# Patient Record
Sex: Male | Born: 1935 | Race: White | Hispanic: No | Marital: Married | Smoking: Never smoker
Health system: Southern US, Community
[De-identification: ages and names within clinical notes are randomized; demographics above are authoritative.]

## PROBLEM LIST (undated history)

## (undated) DIAGNOSIS — E139 Other specified diabetes mellitus without complications: Secondary | ICD-10-CM

## (undated) DIAGNOSIS — K8050 Calculus of bile duct without cholangitis or cholecystitis without obstruction: Secondary | ICD-10-CM

## (undated) DIAGNOSIS — E785 Hyperlipidemia, unspecified: Secondary | ICD-10-CM

## (undated) DIAGNOSIS — T7840XA Allergy, unspecified, initial encounter: Secondary | ICD-10-CM

## (undated) DIAGNOSIS — I10 Essential (primary) hypertension: Secondary | ICD-10-CM

## (undated) DIAGNOSIS — I714 Abdominal aortic aneurysm, without rupture, unspecified: Secondary | ICD-10-CM

## (undated) DIAGNOSIS — K649 Unspecified hemorrhoids: Secondary | ICD-10-CM

## (undated) DIAGNOSIS — M810 Age-related osteoporosis without current pathological fracture: Secondary | ICD-10-CM

## (undated) DIAGNOSIS — K635 Polyp of colon: Secondary | ICD-10-CM

## (undated) DIAGNOSIS — I722 Aneurysm of renal artery: Secondary | ICD-10-CM

## (undated) DIAGNOSIS — G629 Polyneuropathy, unspecified: Secondary | ICD-10-CM

## (undated) DIAGNOSIS — I2510 Atherosclerotic heart disease of native coronary artery without angina pectoris: Secondary | ICD-10-CM

## (undated) DIAGNOSIS — C801 Malignant (primary) neoplasm, unspecified: Secondary | ICD-10-CM

## (undated) DIAGNOSIS — K219 Gastro-esophageal reflux disease without esophagitis: Secondary | ICD-10-CM

## (undated) DIAGNOSIS — C259 Malignant neoplasm of pancreas, unspecified: Secondary | ICD-10-CM

## (undated) DIAGNOSIS — C4491 Basal cell carcinoma of skin, unspecified: Secondary | ICD-10-CM

## (undated) DIAGNOSIS — B019 Varicella without complication: Secondary | ICD-10-CM

## (undated) DIAGNOSIS — K760 Fatty (change of) liver, not elsewhere classified: Secondary | ICD-10-CM

## (undated) DIAGNOSIS — N400 Enlarged prostate without lower urinary tract symptoms: Secondary | ICD-10-CM

## (undated) DIAGNOSIS — R252 Cramp and spasm: Secondary | ICD-10-CM

## (undated) DIAGNOSIS — I700 Atherosclerosis of aorta: Secondary | ICD-10-CM

## (undated) HISTORY — DX: Malignant neoplasm of pancreas, unspecified: C25.9

## (undated) HISTORY — DX: Cramp and spasm: R25.2

## (undated) HISTORY — PX: OTHER SURGICAL HISTORY: SHX169

## (undated) HISTORY — PX: TONSILECTOMY, ADENOIDECTOMY, BILATERAL MYRINGOTOMY AND TUBES: SHX2538

## (undated) HISTORY — PX: ESOPHAGOGASTRODUODENOSCOPY (EGD) WITH PROPOFOL: SHX5813

## (undated) HISTORY — DX: Hyperlipidemia, unspecified: E78.5

## (undated) HISTORY — DX: Polyp of colon: K63.5

## (undated) HISTORY — DX: Malignant (primary) neoplasm, unspecified: C80.1

## (undated) HISTORY — PX: EYE SURGERY: SHX253

## (undated) HISTORY — PX: EYE MUSCLE SURGERY: SHX370

## (undated) HISTORY — DX: Varicella without complication: B01.9

## (undated) HISTORY — DX: Allergy, unspecified, initial encounter: T78.40XA

## (undated) HISTORY — PX: CATARACT EXTRACTION: SUR2

## (undated) HISTORY — DX: Basal cell carcinoma of skin, unspecified: C44.91

## (undated) HISTORY — PX: TONSILLECTOMY AND ADENOIDECTOMY: SUR1326

## (undated) HISTORY — DX: Polyneuropathy, unspecified: G62.9

---

## 2004-06-20 DIAGNOSIS — C61 Malignant neoplasm of prostate: Secondary | ICD-10-CM

## 2004-06-20 DIAGNOSIS — C801 Malignant (primary) neoplasm, unspecified: Secondary | ICD-10-CM

## 2004-06-20 HISTORY — DX: Malignant (primary) neoplasm, unspecified: C80.1

## 2004-06-20 HISTORY — DX: Malignant neoplasm of prostate: C61

## 2007-12-16 LAB — HM COLONOSCOPY: HM Colonoscopy: NORMAL

## 2008-02-28 LAB — HM COLONOSCOPY: HM Colonoscopy: NORMAL

## 2011-07-06 DIAGNOSIS — H353290 Exudative age-related macular degeneration, unspecified eye, stage unspecified: Secondary | ICD-10-CM | POA: Diagnosis not present

## 2011-08-10 DIAGNOSIS — H353290 Exudative age-related macular degeneration, unspecified eye, stage unspecified: Secondary | ICD-10-CM | POA: Diagnosis not present

## 2011-09-05 DIAGNOSIS — H01009 Unspecified blepharitis unspecified eye, unspecified eyelid: Secondary | ICD-10-CM | POA: Diagnosis not present

## 2011-09-21 DIAGNOSIS — H353290 Exudative age-related macular degeneration, unspecified eye, stage unspecified: Secondary | ICD-10-CM | POA: Diagnosis not present

## 2011-09-26 DIAGNOSIS — C4491 Basal cell carcinoma of skin, unspecified: Secondary | ICD-10-CM | POA: Diagnosis not present

## 2011-09-26 DIAGNOSIS — D485 Neoplasm of uncertain behavior of skin: Secondary | ICD-10-CM | POA: Diagnosis not present

## 2011-10-03 DIAGNOSIS — H01009 Unspecified blepharitis unspecified eye, unspecified eyelid: Secondary | ICD-10-CM | POA: Diagnosis not present

## 2011-11-03 DIAGNOSIS — C4441 Basal cell carcinoma of skin of scalp and neck: Secondary | ICD-10-CM | POA: Diagnosis not present

## 2011-11-03 DIAGNOSIS — C4491 Basal cell carcinoma of skin, unspecified: Secondary | ICD-10-CM | POA: Diagnosis not present

## 2011-11-04 DIAGNOSIS — H353290 Exudative age-related macular degeneration, unspecified eye, stage unspecified: Secondary | ICD-10-CM | POA: Diagnosis not present

## 2011-11-23 DIAGNOSIS — C44319 Basal cell carcinoma of skin of other parts of face: Secondary | ICD-10-CM | POA: Diagnosis not present

## 2011-11-23 DIAGNOSIS — C4491 Basal cell carcinoma of skin, unspecified: Secondary | ICD-10-CM | POA: Diagnosis not present

## 2011-12-31 DIAGNOSIS — H353290 Exudative age-related macular degeneration, unspecified eye, stage unspecified: Secondary | ICD-10-CM | POA: Diagnosis not present

## 2012-02-09 DIAGNOSIS — Z125 Encounter for screening for malignant neoplasm of prostate: Secondary | ICD-10-CM | POA: Diagnosis not present

## 2012-02-09 DIAGNOSIS — E785 Hyperlipidemia, unspecified: Secondary | ICD-10-CM | POA: Diagnosis not present

## 2012-02-16 DIAGNOSIS — E785 Hyperlipidemia, unspecified: Secondary | ICD-10-CM | POA: Diagnosis not present

## 2012-02-16 DIAGNOSIS — Z23 Encounter for immunization: Secondary | ICD-10-CM | POA: Diagnosis not present

## 2012-02-16 DIAGNOSIS — C61 Malignant neoplasm of prostate: Secondary | ICD-10-CM | POA: Diagnosis not present

## 2012-02-22 DIAGNOSIS — H353290 Exudative age-related macular degeneration, unspecified eye, stage unspecified: Secondary | ICD-10-CM | POA: Diagnosis not present

## 2012-03-05 DIAGNOSIS — Z85828 Personal history of other malignant neoplasm of skin: Secondary | ICD-10-CM | POA: Diagnosis not present

## 2012-03-05 DIAGNOSIS — L570 Actinic keratosis: Secondary | ICD-10-CM | POA: Diagnosis not present

## 2012-04-27 DIAGNOSIS — H353290 Exudative age-related macular degeneration, unspecified eye, stage unspecified: Secondary | ICD-10-CM | POA: Diagnosis not present

## 2012-06-22 DIAGNOSIS — H353290 Exudative age-related macular degeneration, unspecified eye, stage unspecified: Secondary | ICD-10-CM | POA: Diagnosis not present

## 2012-08-17 DIAGNOSIS — H35059 Retinal neovascularization, unspecified, unspecified eye: Secondary | ICD-10-CM | POA: Diagnosis not present

## 2012-08-17 DIAGNOSIS — H31019 Macula scars of posterior pole (postinflammatory) (post-traumatic), unspecified eye: Secondary | ICD-10-CM | POA: Diagnosis not present

## 2012-08-17 DIAGNOSIS — H35379 Puckering of macula, unspecified eye: Secondary | ICD-10-CM | POA: Diagnosis not present

## 2012-08-17 DIAGNOSIS — H353290 Exudative age-related macular degeneration, unspecified eye, stage unspecified: Secondary | ICD-10-CM | POA: Diagnosis not present

## 2012-08-29 DIAGNOSIS — H35059 Retinal neovascularization, unspecified, unspecified eye: Secondary | ICD-10-CM | POA: Diagnosis not present

## 2012-08-29 DIAGNOSIS — H353290 Exudative age-related macular degeneration, unspecified eye, stage unspecified: Secondary | ICD-10-CM | POA: Diagnosis not present

## 2012-09-11 ENCOUNTER — Ambulatory Visit: Payer: Self-pay | Admitting: Internal Medicine

## 2012-09-26 DIAGNOSIS — H353290 Exudative age-related macular degeneration, unspecified eye, stage unspecified: Secondary | ICD-10-CM | POA: Diagnosis not present

## 2012-09-26 DIAGNOSIS — H31019 Macula scars of posterior pole (postinflammatory) (post-traumatic), unspecified eye: Secondary | ICD-10-CM | POA: Diagnosis not present

## 2012-09-26 DIAGNOSIS — H35379 Puckering of macula, unspecified eye: Secondary | ICD-10-CM | POA: Diagnosis not present

## 2012-09-26 DIAGNOSIS — H35059 Retinal neovascularization, unspecified, unspecified eye: Secondary | ICD-10-CM | POA: Diagnosis not present

## 2012-10-31 ENCOUNTER — Encounter: Payer: Self-pay | Admitting: Internal Medicine

## 2012-11-21 DIAGNOSIS — H353290 Exudative age-related macular degeneration, unspecified eye, stage unspecified: Secondary | ICD-10-CM | POA: Diagnosis not present

## 2012-11-27 DIAGNOSIS — L570 Actinic keratosis: Secondary | ICD-10-CM | POA: Diagnosis not present

## 2012-11-27 DIAGNOSIS — L821 Other seborrheic keratosis: Secondary | ICD-10-CM | POA: Diagnosis not present

## 2012-11-27 DIAGNOSIS — Z85828 Personal history of other malignant neoplasm of skin: Secondary | ICD-10-CM | POA: Diagnosis not present

## 2012-12-10 ENCOUNTER — Ambulatory Visit: Payer: Self-pay | Admitting: Internal Medicine

## 2012-12-13 ENCOUNTER — Encounter: Payer: Self-pay | Admitting: Internal Medicine

## 2012-12-13 ENCOUNTER — Other Ambulatory Visit: Payer: Self-pay | Admitting: Internal Medicine

## 2012-12-13 ENCOUNTER — Ambulatory Visit (INDEPENDENT_AMBULATORY_CARE_PROVIDER_SITE_OTHER): Payer: Medicare Other | Admitting: Internal Medicine

## 2012-12-13 VITALS — BP 118/68 | HR 66 | Temp 97.6°F | Resp 16 | Ht 77.0 in | Wt 191.5 lb

## 2012-12-13 DIAGNOSIS — R252 Cramp and spasm: Secondary | ICD-10-CM

## 2012-12-13 DIAGNOSIS — R634 Abnormal weight loss: Secondary | ICD-10-CM | POA: Diagnosis not present

## 2012-12-13 DIAGNOSIS — E538 Deficiency of other specified B group vitamins: Secondary | ICD-10-CM | POA: Diagnosis not present

## 2012-12-13 NOTE — Patient Instructions (Addendum)
We are checking your electrolytes and thyroid function today  I will review records from [facility] once available.   Return in August or September for your annual physical   Try taking Gas X  prior to your evening meal to see if it helps the discomfort you are having  If the feeling is resolved by walking after your biggest meal,  Try to continue this healthy practice

## 2012-12-13 NOTE — Progress Notes (Signed)
Patient ID: George Owens, male   DOB: [birth year], 79 y.o.   MRN: [id number]  Patient Active Problem List   Diagnosis Date Noted  . Hyperlipidemia   . Allergy   . Loss of weight 12/13/2012  . Muscle cramps 12/13/2012    Subjective:  CC:   Chief Complaint  Patient presents with  . Establish Care    HPI:   [name] is a 79 y.o. male who presents as a new patient to establish primary care with the chief complaint of   New patient  Leg cramps occurring in both legs.  Sporadic, also occurring in both feet and left hand. Aggravated by supine position that places pressure on left buttock in the middle of the night.  Does not occur with exercise, including  swimming or walking.  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  No daily pain . Has tried aleve at bedtime with no significant change.     Soles of feet sometimes feel cold and wet.  Occurs infrequently while at  rest for the past month.  Last labs were August 2013.   No history of ulcer or abdominal surgery but has been having LUQ pressure, not pain , aggravated by sitting or reclining after a meal. Unintentional wt loss of 15 lbs over the past 2 years since moving to [facility] despite voracious appetite.    Tends to become constipated .  Usually resolved with prune juice. History of colonic polyps,  None on last colonoscopy over 5 years ago at [place].   History of prostate CA remotely., treated with XRT while living in [place].   Annual PSAs and DRE have been normal   Vision loss:  Macular degeneration managed by [facility] in [place]   Past Medical History  Diagnosis Date  . Chicken pox   . Colon polyps   . Neuropathy   . Leg cramps   . Hyperlipidemia   . history of prostate CA   . Allergy     Past Surgical History  Procedure Laterality Date  . Tonsilectomy,  adenoidectomy, bilateral myringotomy and tubes    . Eye surgery      Family History  Problem Relation Age of Onset  . Stroke Mother   . Arthritis Father   . Heart disease Father     History   Social History  . Marital Status: Married    Spouse Name: N/A    Number of Children: N/A  . Years of Education: N/A   Occupational History  . Not on file.   Social History Main Topics  . Smoking status: Never Smoker   . Smokeless tobacco: Never Used  . Alcohol Use: No  . Drug Use: No  . Sexually Active: Yes -- Male partner(s)   Other Topics Concern  . Not on file   Social History Narrative  . No narrative on file       @ALLHX @    Review of Systems:   The remainder of the review of systems was negative except those addressed in the HPI.       Objective:  BP 118/68  Pulse 66  Temp(Src) 97.6 F (36.4 C) (Oral)  Resp 16  Ht 6\' 5"  (1.956 m)  Wt 191 lb 8 oz (86.864 kg)  BMI 22.7 kg/m2  SpO2 99%  General appearance: alert, cooperative and appears stated age  Ears: normal TM's and external ear canals both ears Throat: lips, mucosa, and tongue normal; teeth and gums normal Neck: no adenopathy, no carotid bruit, supple, symmetrical, trachea midline and thyroid not enlarged, symmetric, no tenderness/mass/nodules Back: symmetric, no curvature. ROM normal. No CVA tenderness. Lungs: clear to auscultation bilaterally Heart: regular rate and rhythm, S1, S2 normal, no murmur, click, rub or gallop Abdomen: soft, non-tender; bowel sounds normal; no masses,  no organomegaly Pulses: 2+ and symmetric Skin: Skin color, texture, turgor normal. No rashes or lesions Lymph nodes: Cervical, supraclavicular, and axillary nodes normal.  Assessment and Plan:  Muscle cramps Nocturnal,  With no evidence of hypocalcemia or other electrolyte abnormalities. thyroid function normal.  reassurance provided  Trial of tonic water.   Loss of weight Etiology unclear.  Need to consider  prostate CA recurrence and colon CA given history of polyps.  Thyroid function normal. Will check records from [place] to determine if he is due for repeat PSA and colonoscopy.  [name]    Updated Medication List Outpatient Encounter Prescriptions as of 12/13/2012  Medication Sig Dispense Refill  . Glucosamine-Chondroit-Vit C-Mn (GLUCOSAMINE CHONDR 1500 COMPLX) CAPS Take 2 capsules by mouth daily.      [name] loratadine (CLARITIN) 10 MG tablet Take 10 mg by mouth daily.      . Multiple Vitamins-Minerals (PRESERVISION AREDS 2) CAPS Take 2 capsules by mouth daily.      . Omega-3 Fatty Acids (FISH OIL MAXIMUM STRENGTH) 1200 MG CAPS Take 2 capsules by mouth daily.       No facility-administered encounter medications on file as of 12/13/2012.     Orders Placed This Encounter  Procedures  . Comprehensive metabolic panel  . Magnesium  . TSH  . Vitamin B12 Deficiency Panel - [facility]  . CBC with Differential    No Follow-up on file.

## 2012-12-14 LAB — CBC WITH DIFFERENTIAL/PLATELET
Basophils Absolute: 0 10*3/uL (ref 0.0–0.1)
Basophils Relative: 0.5 % (ref 0.0–3.0)
Eosinophils Absolute: 0.1 10*3/uL (ref 0.0–0.7)
Eosinophils Relative: 1.7 % (ref 0.0–5.0)
HCT: 41.5 % (ref 39.0–52.0)
Hemoglobin: 14 g/dL (ref 13.0–17.0)
Lymphocytes Relative: 31.4 % (ref 12.0–46.0)
Lymphs Abs: 1.5 10*3/uL (ref 0.7–4.0)
MCHC: 33.8 g/dL (ref 30.0–36.0)
MCV: 91.7 fl (ref 78.0–100.0)
Monocytes Absolute: 0.4 10*3/uL (ref 0.1–1.0)
Monocytes Relative: 8.3 % (ref 3.0–12.0)
Neutro Abs: 2.8 10*3/uL (ref 1.4–7.7)
Neutrophils Relative %: 58.1 % (ref 43.0–77.0)
Platelets: 154 10*3/uL (ref 150.0–400.0)
RBC: 4.52 Mil/uL (ref 4.22–5.81)
RDW: 13.4 % (ref 11.5–14.6)
WBC: 4.8 10*3/uL (ref 4.5–10.5)

## 2012-12-14 LAB — COMPREHENSIVE METABOLIC PANEL
ALT: 25 U/L (ref 0–53)
AST: 27 U/L (ref 0–37)
Albumin: 4.2 g/dL (ref 3.5–5.2)
Alkaline Phosphatase: 56 U/L (ref 39–117)
BUN: 21 mg/dL (ref 6–23)
CO2: 31 mEq/L (ref 19–32)
Calcium: 9.2 mg/dL (ref 8.4–10.5)
Chloride: 101 mEq/L (ref 96–112)
Creatinine, Ser: 0.9 mg/dL (ref 0.4–1.5)
GFR: 88.88 mL/min (ref >60.00)
Glucose, Bld: 98 mg/dL (ref 70–99)
Potassium: 4.5 mEq/L (ref 3.5–5.1)
Sodium: 138 mEq/L (ref 135–145)
Total Bilirubin: 0.5 mg/dL (ref 0.3–1.2)
Total Protein: 7 g/dL (ref 6.0–8.3)

## 2012-12-14 LAB — MAGNESIUM: Magnesium: 2.1 mg/dL (ref 1.5–2.5)

## 2012-12-14 LAB — TSH: TSH: 2.89 u[IU]/mL (ref 0.35–5.50)

## 2012-12-15 ENCOUNTER — Encounter: Payer: Self-pay | Admitting: Internal Medicine

## 2012-12-15 DIAGNOSIS — T7840XA Allergy, unspecified, initial encounter: Secondary | ICD-10-CM | POA: Insufficient documentation

## 2012-12-15 DIAGNOSIS — E785 Hyperlipidemia, unspecified: Secondary | ICD-10-CM | POA: Insufficient documentation

## 2012-12-15 LAB — VITAMIN B12: Vitamin B-12: 272 pg/mL (ref 211–911)

## 2012-12-15 NOTE — Assessment & Plan Note (Signed)
Nocturnal,  With no evidence of hypocalcemia or other electrolyte abnormalities. thyroid function normal.  reassurance provided  Trial of tonic water.

## 2012-12-15 NOTE — Assessment & Plan Note (Signed)
Etiology unclear.  Need to consider prostate CA recurrence and colon CA given history of polyps.  Thyroid function normal. Will check records from [place] to determine if he is due for repeat PSA and colonoscopy.  George Owens

## 2012-12-20 LAB — METHYLMALONIC ACID, SERUM: Methylmalonic Acid, Quant: 0.26 umol/L (ref <0.40)

## 2013-01-23 ENCOUNTER — Other Ambulatory Visit: Payer: Self-pay

## 2013-02-05 DIAGNOSIS — H35059 Retinal neovascularization, unspecified, unspecified eye: Secondary | ICD-10-CM | POA: Diagnosis not present

## 2013-02-05 DIAGNOSIS — H353290 Exudative age-related macular degeneration, unspecified eye, stage unspecified: Secondary | ICD-10-CM | POA: Diagnosis not present

## 2013-02-05 DIAGNOSIS — H35379 Puckering of macula, unspecified eye: Secondary | ICD-10-CM | POA: Diagnosis not present

## 2013-02-05 DIAGNOSIS — H31019 Macula scars of posterior pole (postinflammatory) (post-traumatic), unspecified eye: Secondary | ICD-10-CM | POA: Diagnosis not present

## 2013-02-27 ENCOUNTER — Ambulatory Visit (INDEPENDENT_AMBULATORY_CARE_PROVIDER_SITE_OTHER): Payer: Medicare Other | Admitting: Internal Medicine

## 2013-02-27 ENCOUNTER — Encounter: Payer: Self-pay | Admitting: Internal Medicine

## 2013-02-27 VITALS — BP 108/76 | HR 88 | Temp 98.1°F | Resp 14 | Ht 76.75 in | Wt 190.0 lb

## 2013-02-27 DIAGNOSIS — R1012 Left upper quadrant pain: Secondary | ICD-10-CM | POA: Diagnosis not present

## 2013-02-27 DIAGNOSIS — R252 Cramp and spasm: Secondary | ICD-10-CM

## 2013-02-27 DIAGNOSIS — Z125 Encounter for screening for malignant neoplasm of prostate: Secondary | ICD-10-CM | POA: Diagnosis not present

## 2013-02-27 DIAGNOSIS — E785 Hyperlipidemia, unspecified: Secondary | ICD-10-CM

## 2013-02-27 DIAGNOSIS — M791 Myalgia, unspecified site: Secondary | ICD-10-CM | POA: Insufficient documentation

## 2013-02-27 DIAGNOSIS — Z0000 Encounter for general adult medical examination without abnormal findings: Secondary | ICD-10-CM

## 2013-02-27 DIAGNOSIS — Z860100 Personal history of colon polyps, unspecified: Secondary | ICD-10-CM | POA: Insufficient documentation

## 2013-02-27 DIAGNOSIS — R634 Abnormal weight loss: Secondary | ICD-10-CM

## 2013-02-27 DIAGNOSIS — Z8546 Personal history of malignant neoplasm of prostate: Secondary | ICD-10-CM

## 2013-02-27 DIAGNOSIS — T466X5A Adverse effect of antihyperlipidemic and antiarteriosclerotic drugs, initial encounter: Secondary | ICD-10-CM | POA: Insufficient documentation

## 2013-02-27 DIAGNOSIS — Z789 Other specified health status: Secondary | ICD-10-CM

## 2013-02-27 DIAGNOSIS — Z888 Allergy status to other drugs, medicaments and biological substances status: Secondary | ICD-10-CM

## 2013-02-27 LAB — BASIC METABOLIC PANEL
BUN: 18 mg/dL (ref 6–23)
CO2: 29 mEq/L (ref 19–32)
Calcium: 9.1 mg/dL (ref 8.4–10.5)
Chloride: 101 mEq/L (ref 96–112)
Creatinine, Ser: 0.9 mg/dL (ref 0.4–1.5)
GFR: 92.46 mL/min (ref >60.00)
Glucose, Bld: 88 mg/dL (ref 70–99)
Potassium: 3.9 mEq/L (ref 3.5–5.1)
Sodium: 138 mEq/L (ref 135–145)

## 2013-02-27 LAB — PSA, MEDICARE: PSA: 0.68 ng/ml (ref 0.10–4.00)

## 2013-02-27 LAB — H. PYLORI ANTIBODY, IGG: H Pylori IgG: NEGATIVE

## 2013-02-27 MED ORDER — ZOSTER VACCINE LIVE 19400 UNT/0.65ML ~~LOC~~ SOLR: 0.6500 mL | Freq: Once | SUBCUTANEOUS | Status: DC

## 2013-02-27 MED ORDER — TETANUS-DIPHTH-ACELL PERTUSSIS 5-2.5-18.5 LF-MCG/0.5 IM SUSP: 0.5000 mL | Freq: Once | INTRAMUSCULAR | Status: DC

## 2013-02-27 NOTE — Assessment & Plan Note (Addendum)
Given his continued loss of weight , history of prostate cancer and normal thyroid function at last screen, I have recommended CT the abdomen and pelvis to evaluate the post prandial fullness that he cites his occurring regularly. This is normal we will refer for endoscopy to rule out gastric cancer.

## 2013-02-27 NOTE — Assessment & Plan Note (Addendum)
Untreated due to history of statin intolerance. Patient has no history of heart disease or hypertension and is physically active.

## 2013-02-27 NOTE — Assessment & Plan Note (Signed)
Annual male exam was done including testicular and prostate exam. PSA is pending .  Colon ca screening was reviewed and options given.   

## 2013-02-27 NOTE — Progress Notes (Signed)
Patient ID: George Owens, male   DOB: Oct 16, 1931, 79 y.o.   MRN: [id number]  Annual exam  The patient is here for annual Medicare wellness examination and management of other chronic and acute problems.  Leg cramps occurring in both legs.  Sporadic, also occurring in both feet and left hand. Aggravated by supine position that places pressure on left buttock in the middle of the night.  Does not occur with exercise, including  swimming or walking.  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  No daily pain . Has tried aleve at bedtime with no significant change.     Soles of feet sometimes feel cold and wet.  Occurs infrequently while at  rest for the past month.  Contiues to have mild LUQ pressure, not pain , aggravated by sitting or reclining after a meal. Unintentional wt loss of 16 lbs over the past 2 years since moving to [place] despite a voracious appetite.    Tends to become constipated .  Usually resolved with prune juice. History of colonic polyps,  None on last colonoscopy over 5 years ago at [place].   History of prostate CA remotely., treated with XRT while living in [place].   Annual PSAs and DRE have been normal   Vision loss:  Macular degeneration managed by [facility] in [place]     The risk factors are reflected in the social history.  The roster of all physicians providing medical care to patient - is listed in the Snapshot section of the chart.  Activities of daily living:  The patient is 100% independent in all ADLs: dressing, toileting, feeding as well as independent mobility  Home safety : The patient has smoke detectors in the home. They wear seatbelts.  There are no firearms at home. There is no violence in the home.   There is no risks for hepatitis, STDs or HIV. There is no   history of blood transfusion. They have  no travel history to infectious disease endemic areas of the world.  The patient has seen their dentist in the last six month. They have seen their eye doctor in the last year. They admit to slight hearing difficulty with regard to whispered voices and some television programs.  They have deferred audiologic testing in the last year.  They do not  have excessive sun exposure. Discussed the need for sun protection: hats, long sleeves and use of sunscreen if there is significant sun exposure.   Diet: the importance of a healthy diet is discussed. They do have a healthy diet.  The benefits of regular aerobic exercise were discussed. She walks 4 times per week ,  20 minutes.   Depression screen: there are no signs or vegative symptoms of depression- irritability, change in appetite, anhedonia, sadness/tearfullness.  Cognitive assessment: the patient manages all their financial and personal affairs and is actively engaged. They could relate day,date,year and events; recalled 2/3 objects at 3 minutes; performed clock-face test normally.  The following portions of the patient's history were reviewed and updated as appropriate: allergies, current medications, past family history, past medical history,  past surgical history, past social history  and problem list.  Visual acuity was not assessed per patient preference since she has regular follow up with her ophthalmologist. Hearing and body mass index were assessed and reviewed.   During the course of the  visit the patient was educated and counseled about appropriate screening and preventive services including : fall prevention , diabetes screening, nutrition counseling, colorectal cancer screening, and recommended immunizations.    Objective  General Appearance:    Alert, cooperative, no distress, appears stated age  Head:    Normocephalic, without obvious abnormality, atraumatic  Eyes:    PERRL, conjunctiva/corneas clear, EOM's intact, fundi     benign, both eyes  Ears:    Normal TM's and external ear canals, both ears  Nose:   Nares normal, septum midline, mucosa normal, no drainage    or sinus tenderness  Throat:   Lips, mucosa, and tongue normal; teeth and gums normal  Neck:   Supple, symmetrical, trachea midline, no adenopathy;    thyroid:  no enlargement/tenderness/nodules; no carotid   bruit or JVD  Back:     Symmetric, no curvature, ROM normal, no CVA tenderness  Lungs:     Clear to auscultation bilaterally, respirations unlabored  Chest Wall:    No tenderness or deformity   Heart:    Regular rate and rhythm, S1 and S2 normal, no murmur, rub   or gallop  Breast Exam:    No tenderness, masses, or nipple abnormality  Abdomen:     Soft, non-tender, bowel sounds active all four quadrants,    no masses, no organomegaly  Genitalia:    Pelvic: cervix normal in appearance, external genitalia normal, no adnexal masses or tenderness, no cervical motion tenderness, rectovaginal septum normal, uterus normal size, shape, and consistency and vagina normal without discharge  Extremities:   Extremities normal, atraumatic, no cyanosis or edema  Pulses:   2+ and symmetric all extremities  Skin:   Skin color, texture, turgor normal, no rashes or lesions  Lymph nodes:   Cervical, supraclavicular, and axillary nodes normal  Neurologic:   CNII-XII intact, normal strength, sensation and reflexes    throughout   Assessment and Plan  Loss of weight Given his continued loss of weight , history of prostate cancer and normal thyroid function at last screen, I have recommended CT the abdomen and pelvis to evaluate the post prandial fullness that he cites his occurring regularly. This is normal we will refer for endoscopy to rule out gastric cancer.  Hyperlipidemia Untreated due to history of statin intolerance. Patient has no history of heart disease or hypertension and is physically active.  Routine general medical examination at a health care  facility Annual male exam was done including testicular and prostate exam. PSA is pending .  Colon ca screening was reviewed and options given.     Updated Medication List Outpatient Encounter Prescriptions as of 02/27/2013  Medication Sig Dispense Refill  . Glucosamine-Chondroit-Vit C-Mn (GLUCOSAMINE CHONDR 1500 COMPLX) CAPS Take 2 capsules by mouth daily.      [name] loratadine (CLARITIN) 10 MG tablet Take 10 mg by mouth daily.      . Multiple Vitamins-Minerals (PRESERVISION AREDS 2) CAPS Take 2 capsules by mouth daily.      . Omega-3 Fatty Acids (FISH OIL MAXIMUM STRENGTH) 1200 MG CAPS Take 2 capsules by mouth daily.      . TDaP (BOOSTRIX) 5-2.5-18.5 LF-MCG/0.5 injection Inject 0.5 mLs into the muscle once.  0.5 mL  0  . zoster vaccine live, PF, (ZOSTAVAX) 16109 UNT/0.65ML injection Inject 19,400 Units into the skin once.  1 each  0   No facility-administered encounter medications on file as of 02/27/2013.

## 2013-02-27 NOTE — Patient Instructions (Addendum)
You had your annual Medicare wellness exam today  We will schedule your  CT of the abdomen and pelvis to evaluate the weight loss issue  If it is normal (noi recurrence of Cancer),  I will refer you to [facility] to have an endoscopy to rule out  Gastric ulcer    I recommend that you have a TDaP caccine and a Shingles vaccine.  I have given you prescriptions for thses because they will be cheaper at the health Dept or at your  local pharmacy because Medicare will not reimburse for them.   We will contact you with the bloodwork results

## 2013-02-28 ENCOUNTER — Encounter: Payer: Self-pay | Admitting: Internal Medicine

## 2013-02-28 ENCOUNTER — Ambulatory Visit: Payer: Self-pay | Admitting: Internal Medicine

## 2013-02-28 DIAGNOSIS — R1012 Left upper quadrant pain: Secondary | ICD-10-CM | POA: Diagnosis not present

## 2013-03-01 ENCOUNTER — Telehealth: Payer: Self-pay | Admitting: Internal Medicine

## 2013-03-01 ENCOUNTER — Encounter: Payer: Self-pay | Admitting: Internal Medicine

## 2013-03-01 DIAGNOSIS — Z8546 Personal history of malignant neoplasm of prostate: Secondary | ICD-10-CM | POA: Insufficient documentation

## 2013-03-01 NOTE — Telephone Encounter (Signed)
CT was normal.  No sign of Cancer..  If he would like to try taking omeprazole 40 mg daily for a few weeks to see if the abdominal symptoms resolve , we can do that before sending to GI

## 2013-03-07 MED ORDER — OMEPRAZOLE 40 MG PO CPDR: 40.0000 mg | DELAYED_RELEASE_CAPSULE | Freq: Every day | ORAL | Status: DC

## 2013-03-07 NOTE — Telephone Encounter (Signed)
Patient would like to try the omeprazole before seeing GI for symptoms.

## 2013-03-07 NOTE — Telephone Encounter (Signed)
rx sent.  If no improvement   in 4 weeks,.  Call for GI eval

## 2013-03-08 NOTE — Telephone Encounter (Signed)
Left message, notifying pt of Rx and to call back with failure of improvement.

## 2013-03-11 ENCOUNTER — Encounter: Payer: Self-pay | Admitting: Internal Medicine

## 2013-03-12 ENCOUNTER — Telehealth: Payer: Self-pay | Admitting: Internal Medicine

## 2013-03-12 NOTE — Telephone Encounter (Signed)
Pt is calling and wanting to speak with you about something. He would not go into detail with me and says you would know about what he was asking.

## 2013-03-12 NOTE — Telephone Encounter (Signed)
Tried to return patient call left voicemail to return call to office.

## 2013-04-25 ENCOUNTER — Other Ambulatory Visit: Payer: Self-pay

## 2013-04-30 DIAGNOSIS — H35059 Retinal neovascularization, unspecified, unspecified eye: Secondary | ICD-10-CM | POA: Diagnosis not present

## 2013-04-30 DIAGNOSIS — H353290 Exudative age-related macular degeneration, unspecified eye, stage unspecified: Secondary | ICD-10-CM | POA: Diagnosis not present

## 2013-06-24 DIAGNOSIS — R1310 Dysphagia, unspecified: Secondary | ICD-10-CM | POA: Diagnosis not present

## 2013-06-28 ENCOUNTER — Ambulatory Visit: Payer: Self-pay | Admitting: Unknown Physician Specialty

## 2013-06-28 DIAGNOSIS — K225 Diverticulum of esophagus, acquired: Secondary | ICD-10-CM | POA: Diagnosis not present

## 2013-06-28 DIAGNOSIS — K219 Gastro-esophageal reflux disease without esophagitis: Secondary | ICD-10-CM | POA: Diagnosis not present

## 2013-06-28 DIAGNOSIS — R1310 Dysphagia, unspecified: Secondary | ICD-10-CM | POA: Diagnosis not present

## 2013-07-09 DIAGNOSIS — R1310 Dysphagia, unspecified: Secondary | ICD-10-CM | POA: Diagnosis not present

## 2013-07-09 DIAGNOSIS — K225 Diverticulum of esophagus, acquired: Secondary | ICD-10-CM | POA: Diagnosis not present

## 2013-07-09 DIAGNOSIS — R933 Abnormal findings on diagnostic imaging of other parts of digestive tract: Secondary | ICD-10-CM | POA: Diagnosis not present

## 2013-07-10 ENCOUNTER — Other Ambulatory Visit: Payer: Self-pay | Admitting: *Deleted

## 2013-07-10 MED ORDER — OMEPRAZOLE 40 MG PO CPDR: 40.0000 mg | DELAYED_RELEASE_CAPSULE | Freq: Every day | ORAL | Status: DC

## 2013-07-16 ENCOUNTER — Ambulatory Visit: Payer: Self-pay | Admitting: Gastroenterology

## 2013-07-16 DIAGNOSIS — H3530 Unspecified macular degeneration: Secondary | ICD-10-CM | POA: Diagnosis not present

## 2013-07-16 DIAGNOSIS — R1310 Dysphagia, unspecified: Secondary | ICD-10-CM | POA: Diagnosis not present

## 2013-07-16 DIAGNOSIS — Z888 Allergy status to other drugs, medicaments and biological substances status: Secondary | ICD-10-CM | POA: Diagnosis not present

## 2013-07-16 DIAGNOSIS — Z8546 Personal history of malignant neoplasm of prostate: Secondary | ICD-10-CM | POA: Diagnosis not present

## 2013-07-16 DIAGNOSIS — E785 Hyperlipidemia, unspecified: Secondary | ICD-10-CM | POA: Diagnosis not present

## 2013-07-16 DIAGNOSIS — Z79899 Other long term (current) drug therapy: Secondary | ICD-10-CM | POA: Diagnosis not present

## 2013-07-16 DIAGNOSIS — R933 Abnormal findings on diagnostic imaging of other parts of digestive tract: Secondary | ICD-10-CM | POA: Diagnosis not present

## 2013-07-17 LAB — PATHOLOGY REPORT

## 2013-08-07 DIAGNOSIS — H353290 Exudative age-related macular degeneration, unspecified eye, stage unspecified: Secondary | ICD-10-CM | POA: Diagnosis not present

## 2013-08-07 DIAGNOSIS — H31019 Macula scars of posterior pole (postinflammatory) (post-traumatic), unspecified eye: Secondary | ICD-10-CM | POA: Diagnosis not present

## 2013-08-07 DIAGNOSIS — H35059 Retinal neovascularization, unspecified, unspecified eye: Secondary | ICD-10-CM | POA: Diagnosis not present

## 2013-08-09 ENCOUNTER — Other Ambulatory Visit: Payer: Self-pay | Admitting: Adult Health

## 2013-08-09 ENCOUNTER — Ambulatory Visit (INDEPENDENT_AMBULATORY_CARE_PROVIDER_SITE_OTHER): Payer: Medicare Other | Admitting: Adult Health

## 2013-08-09 ENCOUNTER — Encounter: Payer: Self-pay | Admitting: Adult Health

## 2013-08-09 ENCOUNTER — Ambulatory Visit (INDEPENDENT_AMBULATORY_CARE_PROVIDER_SITE_OTHER)
Admission: RE | Admit: 2013-08-09 | Discharge: 2013-08-09 | Disposition: A | Discharge status: Home / Self Care | Payer: Medicare Other | Source: Ambulatory Visit | Attending: Adult Health | Admitting: Adult Health

## 2013-08-09 VITALS — BP 110/60 | HR 78 | Temp 97.7°F | Resp 14 | Wt 194.0 lb

## 2013-08-09 DIAGNOSIS — M549 Dorsalgia, unspecified: Secondary | ICD-10-CM | POA: Diagnosis not present

## 2013-08-09 DIAGNOSIS — M25559 Pain in unspecified hip: Secondary | ICD-10-CM | POA: Diagnosis not present

## 2013-08-09 DIAGNOSIS — M169 Osteoarthritis of hip, unspecified: Secondary | ICD-10-CM | POA: Diagnosis not present

## 2013-08-09 DIAGNOSIS — R9389 Abnormal findings on diagnostic imaging of other specified body structures: Secondary | ICD-10-CM

## 2013-08-09 DIAGNOSIS — M1610 Unilateral primary osteoarthritis, unspecified hip: Secondary | ICD-10-CM | POA: Diagnosis not present

## 2013-08-09 DIAGNOSIS — M25552 Pain in left hip: Secondary | ICD-10-CM

## 2013-08-09 DIAGNOSIS — G8929 Other chronic pain: Secondary | ICD-10-CM | POA: Insufficient documentation

## 2013-08-09 LAB — POCT URINALYSIS DIPSTICK
Bilirubin, UA: NEGATIVE
Blood, UA: NEGATIVE
Glucose, UA: NEGATIVE
Ketones, UA: NEGATIVE
Leukocytes, UA: NEGATIVE
Nitrite, UA: NEGATIVE
Protein, UA: NEGATIVE
Spec Grav, UA: 1.025
Urobilinogen, UA: 0.2
pH, UA: 6

## 2013-08-09 NOTE — Progress Notes (Signed)
   Subjective:    Patient ID: George Owens, male    DOB: 02-22-1932, 80 y.o.   MRN: [id number]  HPI  [name] is a pleasant 80 y/o male with hx of prostate cancer who presents to clinic with 1 week hx of left hip and back pain. He has a hx of low back pain with sciatica; however, he reports this pain is not radiating and is concerning him because of his history. His pain is currently at 0/10 but gets to 7/10 especially with walking and certain movements. He cannot lift his left leg as well as the right one.  Current Outpatient Prescriptions on File Prior to Visit  Medication Sig Dispense Refill  . Glucosamine-Chondroit-Vit C-Mn (GLUCOSAMINE CHONDR 1500 COMPLX) CAPS Take 2 capsules by mouth daily.      [name] loratadine (CLARITIN) 10 MG tablet Take 10 mg by mouth daily.      . Multiple Vitamins-Minerals (PRESERVISION AREDS 2) CAPS Take 2 capsules by mouth daily.      . Omega-3 Fatty Acids (FISH OIL MAXIMUM STRENGTH) 1200 MG CAPS Take 2 capsules by mouth daily.      [name] omeprazole (PRILOSEC) 40 MG capsule Take 1 capsule (40 mg total) by mouth daily.  30 capsule  5  . TDaP (BOOSTRIX) 5-2.5-18.5 LF-MCG/0.5 injection Inject 0.5 mLs into the muscle once.  0.5 mL  0  . zoster vaccine live, PF, (ZOSTAVAX) 67893 UNT/0.65ML injection Inject 19,400 Units into the skin once.  1 each  0   No current facility-administered medications on file prior to visit.     Review of Systems  Respiratory: Negative.   Cardiovascular: Negative.   Genitourinary: Negative.  Negative for urgency, frequency, hematuria and flank pain.  Musculoskeletal: Positive for back pain. Arthralgias: left hip pain - worse 8/10 with certain movements.  Psychiatric/Behavioral: Negative.   All other systems reviewed and are negative.       Objective:   Physical Exam  Constitutional: He is oriented to person, place, and time. He appears well-developed and well-nourished. No distress.  Cardiovascular: Normal rate and regular rhythm.    Pulmonary/Chest: Effort normal. No respiratory distress.  Musculoskeletal: He exhibits tenderness.  Left hip tenderness. Decreased ROM of left leg.  Neurological: He is alert and oriented to person, place, and time.  Skin: Skin is warm and dry.  Psychiatric: He has a normal mood and affect. His behavior is normal. Judgment and thought content normal.   BP 110/60  Pulse 78  Temp(Src) 97.7 F (36.5 C) (Oral)  Resp 14  Wt 194 lb (87.998 kg)  SpO2 98%        Assessment & Plan:   1. Back pain Complains of low back pain mainly towards the left side. UA without any blood, nitrites or leukocytes.  - POCT urinalysis dipstick  2. Hip pain, left Pt is concerned about bone metastases give his hx of prostate cancer. PSA is WNL which was done in September. I will send him for xray of left hip to r/o bone mets. Other possible dx - arthritis, bursitis  - DG Hip Complete Left; Future

## 2013-08-09 NOTE — Progress Notes (Signed)
Pre visit review using our clinic review tool, if applicable. No additional management support is needed unless otherwise documented below in the visit note. 

## 2013-08-14 ENCOUNTER — Ambulatory Visit: Payer: Self-pay | Admitting: Adult Health

## 2013-08-14 ENCOUNTER — Telehealth: Payer: Self-pay | Admitting: Adult Health

## 2013-08-14 DIAGNOSIS — Z8546 Personal history of malignant neoplasm of prostate: Secondary | ICD-10-CM | POA: Diagnosis not present

## 2013-08-14 DIAGNOSIS — C61 Malignant neoplasm of prostate: Secondary | ICD-10-CM | POA: Diagnosis not present

## 2013-08-14 DIAGNOSIS — M949 Disorder of cartilage, unspecified: Secondary | ICD-10-CM | POA: Diagnosis not present

## 2013-08-14 DIAGNOSIS — R948 Abnormal results of function studies of other organs and systems: Secondary | ICD-10-CM | POA: Diagnosis not present

## 2013-08-14 DIAGNOSIS — M899 Disorder of bone, unspecified: Secondary | ICD-10-CM | POA: Diagnosis not present

## 2013-08-14 NOTE — Telephone Encounter (Signed)
Spoke with pt regarding results of bone scan showing metastatic disease. He is agreeable to referral to [facility] ASAP.

## 2013-08-19 ENCOUNTER — Ambulatory Visit: Payer: Self-pay | Admitting: Oncology

## 2013-08-19 DIAGNOSIS — Z8546 Personal history of malignant neoplasm of prostate: Secondary | ICD-10-CM | POA: Diagnosis not present

## 2013-08-19 DIAGNOSIS — R948 Abnormal results of function studies of other organs and systems: Secondary | ICD-10-CM | POA: Diagnosis not present

## 2013-08-19 DIAGNOSIS — K649 Unspecified hemorrhoids: Secondary | ICD-10-CM | POA: Diagnosis not present

## 2013-08-19 DIAGNOSIS — S32009A Unspecified fracture of unspecified lumbar vertebra, initial encounter for closed fracture: Secondary | ICD-10-CM | POA: Diagnosis not present

## 2013-08-19 DIAGNOSIS — R970 Elevated carcinoembryonic antigen [CEA]: Secondary | ICD-10-CM | POA: Diagnosis not present

## 2013-08-19 DIAGNOSIS — Z79899 Other long term (current) drug therapy: Secondary | ICD-10-CM | POA: Diagnosis not present

## 2013-08-20 DIAGNOSIS — R948 Abnormal results of function studies of other organs and systems: Secondary | ICD-10-CM | POA: Diagnosis not present

## 2013-08-20 DIAGNOSIS — Z8546 Personal history of malignant neoplasm of prostate: Secondary | ICD-10-CM | POA: Diagnosis not present

## 2013-08-20 LAB — COMPREHENSIVE METABOLIC PANEL
Albumin: 3.8 g/dL (ref 3.4–5.0)
Alkaline Phosphatase: 93 U/L
Anion Gap: 8 (ref 7–16)
BUN: 23 mg/dL — ABNORMAL HIGH (ref 7–18)
Bilirubin,Total: 0.4 mg/dL (ref 0.2–1.0)
Calcium, Total: 8.5 mg/dL (ref 8.5–10.1)
Chloride: 102 mmol/L (ref 98–107)
Co2: 31 mmol/L (ref 21–32)
Creatinine: 0.96 mg/dL (ref 0.60–1.30)
EGFR (African American): >60
EGFR (Non-African Amer.): >60
Glucose: 132 mg/dL — ABNORMAL HIGH (ref 65–99)
Osmolality: 287 (ref 275–301)
Potassium: 4.2 mmol/L (ref 3.5–5.1)
SGOT(AST): 27 U/L (ref 15–37)
SGPT (ALT): 39 U/L (ref 12–78)
Sodium: 141 mmol/L (ref 136–145)
Total Protein: 7.1 g/dL (ref 6.4–8.2)

## 2013-08-20 LAB — CBC CANCER CENTER
Basophil #: 0 x10 3/mm (ref 0.0–0.1)
Basophil %: 0.5 %
Eosinophil #: 0 x10 3/mm (ref 0.0–0.7)
Eosinophil %: 0.7 %
HCT: 43.2 % (ref 40.0–52.0)
HGB: 14.3 g/dL (ref 13.0–18.0)
Lymphocyte #: 1.1 x10 3/mm (ref 1.0–3.6)
Lymphocyte %: 21.8 %
MCH: 29.9 pg (ref 26.0–34.0)
MCHC: 33 g/dL (ref 32.0–36.0)
MCV: 91 fL (ref 80–100)
Monocyte #: 0.4 x10 3/mm (ref 0.2–1.0)
Monocyte %: 6.9 %
Neutrophil #: 3.7 x10 3/mm (ref 1.4–6.5)
Neutrophil %: 70.1 %
Platelet: 156 x10 3/mm (ref 150–440)
RBC: 4.78 10*6/uL (ref 4.40–5.90)
RDW: 13 % (ref 11.5–14.5)
WBC: 5.2 x10 3/mm (ref 3.8–10.6)

## 2013-08-21 LAB — CEA: CEA: 0.8 ng/mL (ref 0.0–4.7)

## 2013-08-21 LAB — PSA: PSA: 0.6 ng/mL (ref 0.0–4.0)

## 2013-08-22 DIAGNOSIS — Z8546 Personal history of malignant neoplasm of prostate: Secondary | ICD-10-CM | POA: Diagnosis not present

## 2013-08-22 DIAGNOSIS — R948 Abnormal results of function studies of other organs and systems: Secondary | ICD-10-CM | POA: Diagnosis not present

## 2013-08-30 DIAGNOSIS — C61 Malignant neoplasm of prostate: Secondary | ICD-10-CM | POA: Diagnosis not present

## 2013-08-30 DIAGNOSIS — M5137 Other intervertebral disc degeneration, lumbosacral region: Secondary | ICD-10-CM | POA: Diagnosis not present

## 2013-08-30 DIAGNOSIS — M48061 Spinal stenosis, lumbar region without neurogenic claudication: Secondary | ICD-10-CM | POA: Diagnosis not present

## 2013-09-02 DIAGNOSIS — Z8546 Personal history of malignant neoplasm of prostate: Secondary | ICD-10-CM | POA: Diagnosis not present

## 2013-09-02 DIAGNOSIS — R948 Abnormal results of function studies of other organs and systems: Secondary | ICD-10-CM | POA: Diagnosis not present

## 2013-09-02 DIAGNOSIS — S32009A Unspecified fracture of unspecified lumbar vertebra, initial encounter for closed fracture: Secondary | ICD-10-CM | POA: Diagnosis not present

## 2013-09-06 ENCOUNTER — Telehealth: Payer: Self-pay | Admitting: *Deleted

## 2013-09-06 DIAGNOSIS — R634 Abnormal weight loss: Secondary | ICD-10-CM

## 2013-09-06 DIAGNOSIS — M549 Dorsalgia, unspecified: Secondary | ICD-10-CM

## 2013-09-06 DIAGNOSIS — M25552 Pain in left hip: Secondary | ICD-10-CM

## 2013-09-06 DIAGNOSIS — E559 Vitamin D deficiency, unspecified: Secondary | ICD-10-CM

## 2013-09-06 DIAGNOSIS — E785 Hyperlipidemia, unspecified: Secondary | ICD-10-CM

## 2013-09-06 DIAGNOSIS — M810 Age-related osteoporosis without current pathological fracture: Secondary | ICD-10-CM

## 2013-09-06 NOTE — Assessment & Plan Note (Addendum)
The hip films showe a sclerotic lesion in the left femur in the introchanteric area , which led to a PET scan.  teh increased uptake on PET scan was in the same area ;  Several ribs were also noted to be hypermetabolic.  The PET  Scan led to an oncology referral and left hip and lumbar spine were evaluated with MRI's which did not suggest metastatic disease as the etiology.  He has been told by Dr Joaquin Bend that he does not have metastatic prostate Ca and is requestiing a second opinion

## 2013-09-06 NOTE — Telephone Encounter (Signed)
Patient called office very concerned because he has received to separate opinions and would like for you to advise. Here is copy of note From phone note dated 08/14/13 Spoke with pt regarding results of bone scan showing metastatic disease. He is agreeable to referral to [facility] ASAP. Dr. Oliva Bustard advised patient no cancer patient is asking for you to review results and advise.

## 2013-09-06 NOTE — Assessment & Plan Note (Signed)
MRI done Feb 2015 (ordered by [facility] and reviewed report online in [place] showed an acute L2 compression fracture with no signs of pathology or malignancy suggested, along with spinal stenosis at multiple levels .

## 2013-09-06 NOTE — Telephone Encounter (Signed)
Per DPR discussed in length with patient spouse scheduled fasting lab for 09/09/13 , patient will make follow up appointment at that time .FYI

## 2013-09-06 NOTE — Telephone Encounter (Signed)
I have discussed his recent workup at length .  Advised him to make an appt to discuss osteoporosis   And to ceom in for fasting labs next week,  Can you call him?

## 2013-09-09 ENCOUNTER — Other Ambulatory Visit (INDEPENDENT_AMBULATORY_CARE_PROVIDER_SITE_OTHER): Payer: Medicare Other

## 2013-09-09 DIAGNOSIS — M810 Age-related osteoporosis without current pathological fracture: Secondary | ICD-10-CM | POA: Diagnosis not present

## 2013-09-09 DIAGNOSIS — E559 Vitamin D deficiency, unspecified: Secondary | ICD-10-CM

## 2013-09-09 DIAGNOSIS — R634 Abnormal weight loss: Secondary | ICD-10-CM

## 2013-09-09 DIAGNOSIS — E785 Hyperlipidemia, unspecified: Secondary | ICD-10-CM | POA: Diagnosis not present

## 2013-09-09 LAB — LIPID PANEL
Cholesterol: 207 mg/dL — ABNORMAL HIGH (ref 0–200)
HDL: 40.9 mg/dL (ref >39.00)
LDL Cholesterol: 144 mg/dL — ABNORMAL HIGH (ref 0–99)
Total CHOL/HDL Ratio: 5
Triglycerides: 109 mg/dL (ref 0.0–149.0)
VLDL: 21.8 mg/dL (ref 0.0–40.0)

## 2013-09-09 LAB — COMPREHENSIVE METABOLIC PANEL
ALT: 34 U/L (ref 0–53)
AST: 29 U/L (ref 0–37)
Albumin: 4 g/dL (ref 3.5–5.2)
Alkaline Phosphatase: 75 U/L (ref 39–117)
BUN: 23 mg/dL (ref 6–23)
CO2: 33 mEq/L — ABNORMAL HIGH (ref 19–32)
Calcium: 9 mg/dL (ref 8.4–10.5)
Chloride: 103 mEq/L (ref 96–112)
Creatinine, Ser: 0.9 mg/dL (ref 0.4–1.5)
GFR: 86.44 mL/min (ref >60.00)
Glucose, Bld: 85 mg/dL (ref 70–99)
Potassium: 3.8 mEq/L (ref 3.5–5.1)
Sodium: 140 mEq/L (ref 135–145)
Total Bilirubin: 1 mg/dL (ref 0.3–1.2)
Total Protein: 6.4 g/dL (ref 6.0–8.3)

## 2013-09-09 LAB — TSH: TSH: 2.98 u[IU]/mL (ref 0.35–5.50)

## 2013-09-09 LAB — MAGNESIUM: Magnesium: 1.9 mg/dL (ref 1.5–2.5)

## 2013-09-10 ENCOUNTER — Encounter: Payer: Self-pay | Admitting: Internal Medicine

## 2013-09-10 DIAGNOSIS — E559 Vitamin D deficiency, unspecified: Secondary | ICD-10-CM | POA: Insufficient documentation

## 2013-09-10 LAB — VITAMIN D 25 HYDROXY (VIT D DEFICIENCY, FRACTURES): Vit D, 25-Hydroxy: 21 ng/mL — ABNORMAL LOW (ref 30–89)

## 2013-09-10 MED ORDER — ERGOCALCIFEROL 1.25 MG (50000 UT) PO CAPS: 50000.0000 [IU] | ORAL_CAPSULE | ORAL | Status: DC

## 2013-09-10 NOTE — Addendum Note (Signed)
Addended by: Crecencio Mc on: 09/10/2013 10:27 AM   Modules accepted: Orders

## 2013-09-11 ENCOUNTER — Encounter: Payer: Self-pay | Admitting: Adult Health

## 2013-09-12 NOTE — Telephone Encounter (Signed)
Unread mychart message mailed  

## 2013-09-16 ENCOUNTER — Ambulatory Visit: Payer: Medicare Other | Admitting: Internal Medicine

## 2013-09-18 ENCOUNTER — Ambulatory Visit: Payer: Self-pay | Admitting: Oncology

## 2013-10-03 ENCOUNTER — Ambulatory Visit: Payer: Medicare Other | Admitting: Internal Medicine

## 2013-10-04 ENCOUNTER — Encounter: Payer: Self-pay | Admitting: Internal Medicine

## 2013-10-04 ENCOUNTER — Ambulatory Visit (INDEPENDENT_AMBULATORY_CARE_PROVIDER_SITE_OTHER): Payer: Medicare Other | Admitting: Internal Medicine

## 2013-10-04 VITALS — BP 106/68 | HR 68 | Temp 97.6°F | Resp 16 | Wt 195.2 lb

## 2013-10-04 DIAGNOSIS — M810 Age-related osteoporosis without current pathological fracture: Secondary | ICD-10-CM | POA: Diagnosis not present

## 2013-10-04 DIAGNOSIS — E559 Vitamin D deficiency, unspecified: Secondary | ICD-10-CM | POA: Diagnosis not present

## 2013-10-04 DIAGNOSIS — Z23 Encounter for immunization: Secondary | ICD-10-CM

## 2013-10-04 DIAGNOSIS — Z8546 Personal history of malignant neoplasm of prostate: Secondary | ICD-10-CM

## 2013-10-04 NOTE — Progress Notes (Signed)
Patient ID: George Owens, male   DOB: [birth year], 80 y.o.   MRN: [id number]  Patient Active Problem List   Diagnosis Date Noted  . Osteoporosis, unspecified 10/06/2013  . Unspecified vitamin D deficiency 09/10/2013  . Hip pain, left 08/09/2013  . Back pain 08/09/2013  . History of prostate cancer 03/01/2013  . Personal history of colonic polyps 02/27/2013  . Statin intolerance 02/27/2013  . Routine general medical examination at a health care facility 02/27/2013  . Hyperlipidemia   . Allergy   . Loss of weight 12/13/2012  . Muscle cramps 12/13/2012    Subjective:  CC:   Chief Complaint  Patient presents with  . Follow-up  . Back Pain    HPI:   George Owens is a 80 y.o. male who presents for Follow up on back pain, secondary to spinal stenosis and L2 compression fracture seen on MRI done in feb 2015.  Concern was raised initially for pathologic fracture due to metastatic prostate CA, but the consensus of the [facility] Tumor Board was negative after reviewing MRI, bone scan and PSA.   He has been taking Taking Vit d for low Vit D in setting of osteoporosis secondary to prostate Ca treatment .  Options for osteoporosis treatment were discussed.     Past Medical History  Diagnosis Date  . Chicken pox   . Colon polyps   . Neuropathy   . Leg cramps   . Hyperlipidemia   . history of prostate CA   . Allergy     Past Surgical History  Procedure Laterality Date  . Tonsilectomy, adenoidectomy, bilateral myringotomy and tubes    . Eye surgery         The following portions of the patient's history were reviewed and updated as appropriate: Allergies, current medications, and problem list.    Review of Systems:   Patient denies headache, fevers, malaise, unintentional weight loss, skin rash, eye pain, sinus congestion and sinus pain, sore throat, dysphagia,  hemoptysis , cough, dyspnea, wheezing, chest pain, palpitations, orthopnea, edema, abdominal pain, nausea, melena,  diarrhea, constipation, flank pain, dysuria, hematuria, urinary  Frequency, nocturia, numbness, tingling, seizures,  Focal weakness, Loss of consciousness,  Tremor, insomnia, depression, anxiety, and suicidal ideation.     History   Social History  . Marital Status: Married    Spouse Name: N/A    Number of Children: N/A  . Years of Education: N/A   Occupational History  . Not on file.   Social History Main Topics  . Smoking status: Never Smoker   . Smokeless tobacco: Never Used  . Alcohol Use: No  . Drug Use: No  . Sexual Activity: Yes    Partners: Female   Other Topics Concern  . Not on file   Social History Narrative  . No narrative on file    Objective:  Filed Vitals:   10/04/13 0943  BP: 106/68  Pulse: 68  Temp: 97.6 F (36.4 C)  Resp: 16     General appearance: alert, cooperative and appears stated age Ears: normal TM's and external ear canals both ears Throat: lips, mucosa, and tongue normal; teeth and gums normal Neck: no adenopathy, no carotid bruit, supple, symmetrical, trachea midline and thyroid not enlarged, symmetric, no tenderness/mass/nodules Back: symmetric, no curvature. ROM normal. No CVA tenderness. Lungs: clear to auscultation bilaterally Heart: regular rate and rhythm, S1, S2 normal, no murmur, click, rub or gallop Abdomen: soft, non-tender; bowel sounds normal; no masses,  no organomegaly Pulses: 2+ and  symmetric Skin: Skin color, texture, turgor normal. No rashes or lesions Lymph nodes: Cervical, supraclavicular, and axillary nodes normal.  Assessment and Plan:  Osteoporosis, unspecified Secondary to treatment for prostate CA.  He has a new L2 fracture.  oral bisphosphonates are contraindicated due to his history of  Dysphagia and Zencker's diverticulum. Authorization for Prolia . Discussed.   Unspecified vitamin D deficiency Drisdol prescribed for low D in the setting of osteoporosis.   History of prostate cancer Diagnosed in  July  2006 with PSA of 9 , S/p Lupron followed by XRT  .  PSA was .04 in 2010 and 0.68 in 2014.     Updated Medication List Outpatient Encounter Prescriptions as of 10/04/2013  Medication Sig  . ergocalciferol (DRISDOL) 50000 UNITS capsule Take 1 capsule (50,000 Units total) by mouth once a week.  . Glucosamine-Chondroit-Vit C-Mn (GLUCOSAMINE CHONDR 1500 COMPLX) CAPS Take 2 capsules by mouth daily.  [name] loratadine (CLARITIN) 10 MG tablet Take 10 mg by mouth daily.  . Multiple Vitamins-Minerals (PRESERVISION AREDS 2) CAPS Take 2 capsules by mouth daily.  . Omega-3 Fatty Acids (FISH OIL MAXIMUM STRENGTH) 1200 MG CAPS Take 2 capsules by mouth daily.  [name] omeprazole (PRILOSEC) 40 MG capsule Take 1 capsule (40 mg total) by mouth daily.  . TDaP (BOOSTRIX) 5-2.5-18.5 LF-MCG/0.5 injection Inject 0.5 mLs into the muscle once.  . zoster vaccine live, PF, (ZOSTAVAX) 94496 UNT/0.65ML injection Inject 19,400 Units into the skin once.     Orders Placed This Encounter  Procedures  . Pneumococcal conjugate vaccine 13-valent    No Follow-up on file.

## 2013-10-04 NOTE — Patient Instructions (Signed)
You need to get 2000 units of Vitamin D3 daily after you finish the megadose  You need 1800 mg calcium daily  Best option is majority through diet,  One supplement   Consider almond milk/coconut combo ,    We will start the process for Prolia authorization for your osteoporosis

## 2013-10-04 NOTE — Progress Notes (Signed)
Pre-visit discussion using our clinic review tool. No additional management support is needed unless otherwise documented below in the visit note.  

## 2013-10-06 DIAGNOSIS — M810 Age-related osteoporosis without current pathological fracture: Secondary | ICD-10-CM | POA: Insufficient documentation

## 2013-10-06 NOTE — Assessment & Plan Note (Signed)
Drisdol prescribed for low D in the setting of osteoporosis.

## 2013-10-06 NOTE — Assessment & Plan Note (Signed)
Secondary to treatment for prostate CA.  He has a new L2 fracture.  oral bisphosphonates are contraindicated due to his history of  Dysphagia and Zencker's diverticulum. Authorization for Prolia . Discussed.

## 2013-10-06 NOTE — Assessment & Plan Note (Signed)
Diagnosed in  July 2006 with PSA of 9 , S/p Lupron followed by XRT  .  PSA was .04 in 2010 and 0.68 in 2014.

## 2013-10-07 ENCOUNTER — Telehealth: Payer: Self-pay | Admitting: Internal Medicine

## 2013-10-07 NOTE — Telephone Encounter (Signed)
Rose, will you please start the process for this patient.

## 2013-10-07 NOTE — Telephone Encounter (Signed)
Message copied by Roney Marion on Mon Oct 07, 2013  9:48 AM ------      Message from: Crecencio Mc      Created: Sun Oct 06, 2013  3:26 PM      Regarding: Prolia       Patient has osteoporosis and recent vertebral fracture secondary to treatment for prostate CA.  Oral bisphosphonates are contraindicated due to dysphagia. I have recommended Prolia so we need d to get him approved for Prolia.            Thanks,            Dr Derrel Nip  ------

## 2013-10-10 NOTE — Telephone Encounter (Signed)
I have faxed the insurance verification request to [facility] and will let you know as soon as I have a response from them. Thank you.

## 2013-10-21 NOTE — Telephone Encounter (Signed)
I have received the insurance verification back from [place] and Mr. Glaspy secondary insurance is requiring a prior authorization.  [facility] will handle the P/A but need the clinicals on this patient in order to proceed.  Can you please fax the clinicals to me so I can get them to [facility]. My fax # is [phone]. Thank you.

## 2013-10-28 ENCOUNTER — Telehealth: Payer: Self-pay | Admitting: Internal Medicine

## 2013-10-28 NOTE — Telephone Encounter (Signed)
The patient called again today to see if his prolia was approved . I faxed the clinical notes over to you. If you did not receive them please let me know I will fax again. Lorriane Shire [phone].

## 2013-10-29 ENCOUNTER — Telehealth: Payer: Self-pay | Admitting: *Deleted

## 2013-10-29 NOTE — Telephone Encounter (Signed)
Yes. agree

## 2013-10-29 NOTE — Telephone Encounter (Signed)
Patient called and ask what you would recommend over the counter for sinus drainage and cough patient stated has no fever mucus clear at this point, does have scratch throat from drainage. Please advise I know you usually recommend benadryl, sudafed PE , and delsym for cough is this ok?

## 2013-10-29 NOTE — Telephone Encounter (Signed)
Spoke with patient wife and informed of the recommendations, she verbalized agreement to give husband the message.

## 2013-11-01 ENCOUNTER — Telehealth: Payer: Self-pay | Admitting: Internal Medicine

## 2013-11-01 NOTE — Telephone Encounter (Signed)
I have not received the clinical notes.  If you could please re-fax them, that would be great and I will get them to [facility] as soon as I receive them. Thank you.

## 2013-11-06 DIAGNOSIS — H35059 Retinal neovascularization, unspecified, unspecified eye: Secondary | ICD-10-CM | POA: Diagnosis not present

## 2013-11-06 DIAGNOSIS — H35379 Puckering of macula, unspecified eye: Secondary | ICD-10-CM | POA: Diagnosis not present

## 2013-11-06 DIAGNOSIS — H31019 Macula scars of posterior pole (postinflammatory) (post-traumatic), unspecified eye: Secondary | ICD-10-CM | POA: Diagnosis not present

## 2013-11-06 DIAGNOSIS — H353290 Exudative age-related macular degeneration, unspecified eye, stage unspecified: Secondary | ICD-10-CM | POA: Diagnosis not present

## 2013-11-07 NOTE — Telephone Encounter (Signed)
Patient called back asking about the status of this prolia. I advised patient that I would need to contact the person who was working on this PA and see if she has heard anything. I called and spoke with Rose and she said that she hadn't received the office notes or Dr. Lupita Dawn BCBS 5 digit number. I have faxed it along with the office notes to [facility]. I called and spoke with Rose she did receive fax she did tell me there was a form that Dr Derrel Nip needed to fill out and that [place] said it could take 7-14 days for them to give her approval.

## 2013-11-07 NOTE — Telephone Encounter (Signed)
I called patient to let him know the status of this PA. I told him I was waiting on Dr. Derrel Nip to sign the document from [facility] and once she filled out her part I would fax it to them.

## 2013-11-08 ENCOUNTER — Telehealth: Payer: Self-pay | Admitting: Internal Medicine

## 2013-11-08 ENCOUNTER — Encounter: Payer: Self-pay | Admitting: Internal Medicine

## 2013-11-08 DIAGNOSIS — IMO0002 Reserved for concepts with insufficient information to code with codable children: Secondary | ICD-10-CM

## 2013-11-08 NOTE — Telephone Encounter (Signed)
Appointment @ [facility] Wed 11/13/13 @ 2:20  Arrive @ 2:10  No cal 24 hours prior  Spoke to pt.  He is aware of appointment.  And that as soon as we get results from his bone density we will send to bcbs to get there approval for his prolia shot.  He stated that may 27 was not a good date for him.  I gave him [place] phone number so he could reschedule his appointment

## 2013-11-08 NOTE — Telephone Encounter (Signed)
He Has not had DEXA scan which is apparently now required for Prolia approval per the authorization form..  Ordered today

## 2013-11-14 ENCOUNTER — Ambulatory Visit: Payer: Self-pay | Admitting: Internal Medicine

## 2013-11-14 ENCOUNTER — Other Ambulatory Visit: Payer: Self-pay | Admitting: *Deleted

## 2013-11-14 DIAGNOSIS — IMO0002 Reserved for concepts with insufficient information to code with codable children: Secondary | ICD-10-CM | POA: Diagnosis not present

## 2013-11-14 DIAGNOSIS — M899 Disorder of bone, unspecified: Secondary | ICD-10-CM | POA: Diagnosis not present

## 2013-11-14 DIAGNOSIS — M810 Age-related osteoporosis without current pathological fracture: Secondary | ICD-10-CM | POA: Diagnosis not present

## 2013-11-14 MED ORDER — OMEPRAZOLE 40 MG PO CPDR: 40.0000 mg | DELAYED_RELEASE_CAPSULE | Freq: Every day | ORAL | Status: DC

## 2013-11-18 NOTE — Telephone Encounter (Signed)
Are we still waiting on the Dexa scan results before Dr. Derrel Nip will complete the P/A for this patient? Thank you.

## 2013-11-20 ENCOUNTER — Telehealth: Payer: Self-pay | Admitting: Internal Medicine

## 2013-11-20 DIAGNOSIS — Z85828 Personal history of other malignant neoplasm of skin: Secondary | ICD-10-CM | POA: Diagnosis not present

## 2013-11-20 DIAGNOSIS — M810 Age-related osteoporosis without current pathological fracture: Secondary | ICD-10-CM

## 2013-11-20 DIAGNOSIS — L570 Actinic keratosis: Secondary | ICD-10-CM | POA: Diagnosis not present

## 2013-11-20 NOTE — Assessment & Plan Note (Signed)
DEXA con firms  Osteoporosis with T scores -3.2

## 2013-11-20 NOTE — Telephone Encounter (Signed)
DEXA con firms  Osteoporosis with T scores -3.2. , done on 5.28.15 Please go ahead with the Prolia request now

## 2013-11-20 NOTE — Telephone Encounter (Signed)
I called Rose to let her know that we have received the missing piece to the form for patient I have faxed it to her at [phone]. I called her to see if she had received it she said not yet. She will contact me in the morning if she didn't receive.

## 2013-11-20 NOTE — Telephone Encounter (Signed)
Perfect I have faxed over the prolia form to [place] at [phone] she is the one that will send it to his insurance company. Please also see phone note from 4.20.15 with all other information regarding this prolia request.

## 2013-11-21 NOTE — Telephone Encounter (Signed)
I did receive the paperwork.  I am going to send everything to [facility] now and will notify you once I have a response back from them. The turn around time for [employer]BS p/a could be up to 15 business days. Thank you.

## 2013-11-22 ENCOUNTER — Telehealth: Payer: Self-pay | Admitting: Internal Medicine

## 2013-11-22 NOTE — Telephone Encounter (Signed)
Patient calling for results on bone density found phone note in chart to shannon tried to call patient no answer left message for patient toreturn call to office ok to release results to patient as in phone note for [facility].

## 2013-11-23 NOTE — Telephone Encounter (Signed)
Yes you can release the results , DEXA scan confirms osteoporosis and Prolia authorization is again underway

## 2013-11-26 NOTE — Telephone Encounter (Signed)
Patient notified

## 2013-12-04 NOTE — Telephone Encounter (Signed)
Usually it does take a few days, but when there is a prior authorization required, it takes longer.  Patient's secondary insurance, BCBS requires a prior authorization and they have their own special form.  We've sent everything in a timely manner when [employer]BS requested it.  [employer]BS told us 10-15 days. Hopefully it will be in soon and I will notify patient as soon as I receive the authorization. Thank you.

## 2013-12-04 NOTE — Telephone Encounter (Signed)
Patient called in today requesting an update on his prolia. I advised him after talking with Kalman Shan that she had faxed the request to his insurance company on 11/21/13 and that they told her it could take up to 10-15 business days. I did ask Rose that when she received the approval to please contact the patient since I would be out of the office next week. Patient isn't upset he is just a little concerned and feels there has been a break down in communication in the office. He states he was told by [employer] that we should have this approval within a few days. I apologized and advised the patient that wasn't the case that it could take a little while to get Prolia approved.

## 2013-12-05 ENCOUNTER — Ambulatory Visit: Payer: Self-pay | Admitting: Oncology

## 2013-12-05 DIAGNOSIS — R972 Elevated prostate specific antigen [PSA]: Secondary | ICD-10-CM | POA: Diagnosis not present

## 2013-12-05 DIAGNOSIS — Z923 Personal history of irradiation: Secondary | ICD-10-CM | POA: Diagnosis not present

## 2013-12-05 DIAGNOSIS — R948 Abnormal results of function studies of other organs and systems: Secondary | ICD-10-CM | POA: Diagnosis not present

## 2013-12-05 DIAGNOSIS — Z8546 Personal history of malignant neoplasm of prostate: Secondary | ICD-10-CM | POA: Diagnosis not present

## 2013-12-05 LAB — CBC CANCER CENTER
Basophil #: 0 x10 3/mm (ref 0.0–0.1)
Basophil %: 0.5 %
Eosinophil #: 0.1 x10 3/mm (ref 0.0–0.7)
Eosinophil %: 2.1 %
HCT: 41.7 % (ref 40.0–52.0)
HGB: 14 g/dL (ref 13.0–18.0)
Lymphocyte #: 1.4 x10 3/mm (ref 1.0–3.6)
Lymphocyte %: 30.4 %
MCH: 30.3 pg (ref 26.0–34.0)
MCHC: 33.5 g/dL (ref 32.0–36.0)
MCV: 90 fL (ref 80–100)
Monocyte #: 0.5 x10 3/mm (ref 0.2–1.0)
Monocyte %: 9.6 %
Neutrophil #: 2.7 x10 3/mm (ref 1.4–6.5)
Neutrophil %: 57.4 %
Platelet: 136 x10 3/mm — ABNORMAL LOW (ref 150–440)
RBC: 4.62 10*6/uL (ref 4.40–5.90)
RDW: 13.2 % (ref 11.5–14.5)
WBC: 4.7 x10 3/mm (ref 3.8–10.6)

## 2013-12-05 LAB — COMPREHENSIVE METABOLIC PANEL
Albumin: 3.8 g/dL (ref 3.4–5.0)
Alkaline Phosphatase: 83 U/L
Anion Gap: 6 — ABNORMAL LOW (ref 7–16)
BUN: 19 mg/dL — ABNORMAL HIGH (ref 7–18)
Bilirubin,Total: 0.8 mg/dL (ref 0.2–1.0)
Calcium, Total: 9.4 mg/dL (ref 8.5–10.1)
Chloride: 102 mmol/L (ref 98–107)
Co2: 34 mmol/L — ABNORMAL HIGH (ref 21–32)
Creatinine: 0.92 mg/dL (ref 0.60–1.30)
EGFR (African American): >60
EGFR (Non-African Amer.): >60
Glucose: 94 mg/dL (ref 65–99)
Osmolality: 285 (ref 275–301)
Potassium: 4.2 mmol/L (ref 3.5–5.1)
SGOT(AST): 29 U/L (ref 15–37)
SGPT (ALT): 51 U/L (ref 12–78)
Sodium: 142 mmol/L (ref 136–145)
Total Protein: 7.3 g/dL (ref 6.4–8.2)

## 2013-12-06 ENCOUNTER — Encounter: Payer: Self-pay | Admitting: Internal Medicine

## 2013-12-06 LAB — PSA: PSA: 1.3 ng/mL (ref 0.0–4.0)

## 2013-12-11 ENCOUNTER — Telehealth: Payer: Self-pay | Admitting: Internal Medicine

## 2013-12-11 NOTE — Telephone Encounter (Signed)
Patient called to follow up on Prolia injection, please advise where we stand on approval from insurance.

## 2013-12-12 NOTE — Telephone Encounter (Signed)
Notified patient that we are waiting on insurance patient stated he will give it a few more days. Patient is very upset that he has had yo wait so long.

## 2013-12-12 NOTE — Telephone Encounter (Signed)
I called on his injection yesterday and was told they are still waiting on authorization from pt's secondary, BCBS. I promise I will call him as soon as I have the authorization. Thank you.

## 2013-12-18 ENCOUNTER — Ambulatory Visit: Payer: Self-pay | Admitting: Oncology

## 2013-12-25 ENCOUNTER — Telehealth: Payer: Self-pay | Admitting: Internal Medicine

## 2013-12-25 DIAGNOSIS — M810 Age-related osteoporosis without current pathological fracture: Secondary | ICD-10-CM

## 2013-12-25 NOTE — Telephone Encounter (Signed)
Patient insurance called and stated they are sending via fax an enrollment form for Prolia for patient that no enrollment has been received. FYI

## 2013-12-25 NOTE — Telephone Encounter (Signed)
Are you saying that the reason it has taken so long is because [place] did not send something that was necessary?  Please elaborate.  We need to get this process nailed down,

## 2013-12-25 NOTE — Telephone Encounter (Signed)
Larene Beach, I placed the form on your desk for review with [facility]

## 2013-12-25 NOTE — Telephone Encounter (Signed)
Insurance stated to me that they had never received  Script for patient Prolia.

## 2013-12-26 MED ORDER — DENOSUMAB 60 MG/ML ~~LOC~~ SOLN: 60.0000 mg | Freq: Once | SUBCUTANEOUS | Status: DC

## 2013-12-26 NOTE — Telephone Encounter (Signed)
Spoke to [employer] @ [employer]. She says [employer] states they did not receive the completed prior authorization form from them.  Ms. George Owens resubmitted the completed form along w/clinicals and says [employer] told her to give them 48 hours.  She is hoping to hear something from them by late Monday or early Tuesday, if not she will follow up and notify me. I will keep a check for a response on this and get back to you as soon as I have something. If you have any further questions please notify me.

## 2014-01-02 NOTE — Telephone Encounter (Signed)
George Owens w/[employer] called today.  She is the site coordinator handling George Owens prior authorization. She states she has called [employer] [place] and they told her [name] should handle the prior authorization (I got the same response when I called them, as well). She states she is being "bounced" back and forth between them, but will continue to try to get the authorization. I've asked her to keep me up to date on her progress and she promises to contact me as soon as she has a response.  I'm not sure what to do next if we don't get a response from them, but I will look into it. Please let me know if you have any further questions or if you have an idea of another route we should take. Thank you.

## 2014-01-03 ENCOUNTER — Telehealth: Payer: Self-pay | Admitting: Internal Medicine

## 2014-01-03 NOTE — Telephone Encounter (Signed)
George Owens  I am gettingtired of fielding complaints from patients about the prolia authorization taking so long!.  Whatever happened to the form I  Gave you on July 8th for [name] Prolia?

## 2014-01-03 NOTE — Telephone Encounter (Signed)
Patient stated he has called his insurance and they deny we have been in touch with them for approval on Prolia patient is very up set please advise where prolia stands.

## 2014-01-03 NOTE — Telephone Encounter (Signed)
Thank you Rose!  I will have Larene Beach speak with patient.  I appreciate your efforts,  i have no idea what else to try.  Are you having this type of difficulty with other patients or just our office?

## 2014-01-03 NOTE — Telephone Encounter (Signed)
Dr. Derrel Nip, I'm not sure why BCBS is denying that we've been in touch w/them. I do have documentation from them, just not an approval #. The form you gave to Larene Beach is the same form that I originally sent to [place] on 10/10/2013.  Please see the phone notes dated 10/07/2013.  That phone note has a trail of the steps that have been taken trying to get this approved. It is the patient's [employer]BS giving [place] a hard time, not [employer]. Please let me know if you have suggestions for me to get this handled in a more timely manner for the patient. Thank you.

## 2014-01-06 NOTE — Telephone Encounter (Signed)
You're welcome. I realize this is frustrating for the patient and I do apologize. I've only had this kind of trouble w/one other patient, who coincidently is at your office as well.  Whenever there is a prior authorization involved the process is always more lengthy, however, it should not be this difficult. I will follow up w/BCBS myself again and see if I can't maybe get in touch w/a manager.  I will continue to document in the original phone note and will notify you as soon as I have a response. Thank you.

## 2014-01-08 NOTE — Telephone Encounter (Signed)
Finally, after so much confusion on BCBS's part and being repeatedly transferred from one department to the next I called [employer] [place] "injectables area" at [phone] and spoke to [facility] (associate ID [phone]) and she informed me that b/c pt's [employer] [place] was a supplement to Medicare there is no prior authorization required b/c they follow Medicare guidelines. Out of all the times we have called and been transferred, we were never told this, but told we did need the prior authorization so I wasn't sure she was correct. After some online research, I called [place] department at [phone] to see if there was a prior authorization required and if so the procedure I needed to follow. I spoke with a lady named [place] and she says the BCBS is a supplement policy which follows Medicare guidelines, so no prior authorization is required.  I asked why the benefits department told us there is a prior auth required and have repeatedly told us so since April, but she didn't have an answer to that, only an apology. I asked for something in writing to confirm there is no prior auth required and Cherlyn Cushing says that since there is no authorization required they can't give me any type of confirmation b/c there's not an approval or denial. I asked how to prove what she told me in the event pt's BCBS denied the claim and she said she was documenting his records.  I asked if she could give a reference # so we would have some sort of proof/confirmation and she gave me the reference [phone] [id number].  I am including of this info in pt's chart in the event there is a billing issue, we can track it back easily.  All that being said, the patient's estimated responsibility with or without an office visit will be $5.00 for his Prolia injection. Please apologize to the patient for the delay. If you have any questions please do not hesitate to let me know.  Thank you.

## 2014-01-08 NOTE — Telephone Encounter (Signed)
No thank you!!!! I will be sure to let Me Obenchain know the measures you took to get this clarified!

## 2014-01-08 NOTE — Telephone Encounter (Signed)
After

## 2014-01-08 NOTE — Telephone Encounter (Signed)
Called patient and left detailed message Prolia injection can be scheduled for next week. To call office and schedule nurse visit. FYI

## 2014-01-09 NOTE — Telephone Encounter (Signed)
I'm just thankful we finally got everything clarified. I know this has been frustrating for him and everyone else involved. I have made notes on his Prolia file to hopefully prevent this in the future. Thank you so much!

## 2014-01-15 ENCOUNTER — Ambulatory Visit (INDEPENDENT_AMBULATORY_CARE_PROVIDER_SITE_OTHER): Payer: Medicare Other | Admitting: *Deleted

## 2014-01-15 DIAGNOSIS — M810 Age-related osteoporosis without current pathological fracture: Secondary | ICD-10-CM | POA: Diagnosis not present

## 2014-01-15 MED ORDER — DENOSUMAB 60 MG/ML ~~LOC~~ SOLN
60.0000 mg | Freq: Once | SUBCUTANEOUS | Status: AC
  Administered 2014-01-15: 60 mg via SUBCUTANEOUS

## 2014-01-17 ENCOUNTER — Other Ambulatory Visit: Payer: Self-pay | Admitting: *Deleted

## 2014-01-17 MED ORDER — OMEPRAZOLE 40 MG PO CPDR: 40.0000 mg | DELAYED_RELEASE_CAPSULE | Freq: Every day | ORAL | Status: DC

## 2014-02-12 DIAGNOSIS — H35379 Puckering of macula, unspecified eye: Secondary | ICD-10-CM | POA: Diagnosis not present

## 2014-02-12 DIAGNOSIS — H353290 Exudative age-related macular degeneration, unspecified eye, stage unspecified: Secondary | ICD-10-CM | POA: Diagnosis not present

## 2014-02-17 ENCOUNTER — Encounter: Payer: Self-pay | Admitting: Internal Medicine

## 2014-02-25 ENCOUNTER — Ambulatory Visit (INDEPENDENT_AMBULATORY_CARE_PROVIDER_SITE_OTHER): Payer: Medicare Other | Admitting: Internal Medicine

## 2014-02-25 ENCOUNTER — Encounter: Payer: Self-pay | Admitting: Internal Medicine

## 2014-02-25 VITALS — BP 110/68 | HR 75 | Temp 98.1°F | Resp 14 | Ht 76.75 in | Wt 196.5 lb

## 2014-02-25 DIAGNOSIS — IMO0001 Reserved for inherently not codable concepts without codable children: Secondary | ICD-10-CM | POA: Diagnosis not present

## 2014-02-25 DIAGNOSIS — M5430 Sciatica, unspecified side: Secondary | ICD-10-CM | POA: Diagnosis not present

## 2014-02-25 DIAGNOSIS — M7918 Myalgia, other site: Secondary | ICD-10-CM

## 2014-02-25 DIAGNOSIS — M5442 Lumbago with sciatica, left side: Secondary | ICD-10-CM

## 2014-02-25 DIAGNOSIS — M810 Age-related osteoporosis without current pathological fracture: Secondary | ICD-10-CM | POA: Diagnosis not present

## 2014-02-25 NOTE — Assessment & Plan Note (Signed)
He received his first Prolia injection in late July after waiting two months for his insurance to approve the medication,

## 2014-02-25 NOTE — Progress Notes (Signed)
Patient ID: George Owens, male   DOB: 1932-03-21, 80 y.o.   MRN: [id number]    Patient Active Problem List   Diagnosis Date Noted  . Osteoporosis, unspecified 10/06/2013  . Unspecified vitamin D deficiency 09/10/2013  . Hip pain, left 08/09/2013  . Back pain 08/09/2013  . History of prostate cancer 03/01/2013  . Personal history of colonic polyps 02/27/2013  . Statin intolerance 02/27/2013  . Routine general medical examination at a health care facility 02/27/2013  . Hyperlipidemia   . Allergy   . Loss of weight 12/13/2012  . Muscle cramps 12/13/2012    Subjective:  CC:   Chief Complaint  Patient presents with  . Follow-up    hip and back pain left sided    HPI:   George Owens is a 80 y.o. male who presents for Low back pain involving both ileosacral joints has been getting worse over the past several weeks  Getting occasional feeling of numbness in the left foot ,  Has occasional  dull ache in the right lower back. The symptoms are not present continually.  He notes back pain in the evening  Which is brought on by lying supine , and develops occasional calf pain with walking and Climbing stairs.  hOwever he walked two miles today without any symptoms.,  He  Is taking naprosyn qhs prn but nothing on a daily basis.   He has a remote history of sciatica that resolved years ago after an epidural sterid injection in L4-L5  Area and a history of L2 compression fracture found on  Feb 2015 MRI.  Done at [facility] .  Report not in chart  History of Osteoporosis ,  By DEXA.  Prolia finally started in July after 2 months of insurance playing games with authorization .  He tolerated the injection  With no side effects despite reading all about the side effects.    [name] He is requesting to see Neurosurgeon Earle Gell.    Past Medical History  Diagnosis Date  . Chicken pox   . Colon polyps   . Neuropathy   . Leg cramps   . Hyperlipidemia   . history of prostate CA   . Allergy      Past Surgical History  Procedure Laterality Date  . Tonsilectomy, adenoidectomy, bilateral myringotomy and tubes    . Eye surgery         The following portions of the patient's history were reviewed and updated as appropriate: Allergies, current medications, and problem list.    Review of Systems:   Patient denies headache, fevers, malaise, unintentional weight loss, skin rash, eye pain, sinus congestion and sinus pain, sore throat, dysphagia,  hemoptysis , cough, dyspnea, wheezing, chest pain, palpitations, orthopnea, edema, abdominal pain, nausea, melena, diarrhea, constipation, flank pain, dysuria, hematuria, urinary  Frequency, nocturia, numbness, tingling, seizures,  Focal weakness, Loss of consciousness,  Tremor, insomnia, depression, anxiety, and suicidal ideation.     History   Social History  . Marital Status: Married    Spouse Name: N/A    Number of Children: N/A  . Years of Education: N/A   Occupational History  . Not on file.   Social History Main Topics  . Smoking status: Never Smoker   . Smokeless tobacco: Never Used  . Alcohol Use: No  . Drug Use: No  . Sexual Activity: Yes    Partners: Female   Other Topics Concern  . Not on file   Social History Narrative  .  No narrative on file    Objective:  Filed Vitals:   02/25/14 1314  BP: 110/68  Pulse: 75  Temp: 98.1 F (36.7 C)  Resp: 14     General appearance: alert, cooperative and appears stated age Ears: normal TM's and external ear canals both ears Throat: lips, mucosa, and tongue normal; teeth and gums normal Neck: no adenopathy, no carotid bruit, supple, symmetrical, trachea midline and thyroid not enlarged, symmetric, no tenderness/mass/nodules Back: symmetric, no curvature. ROM normal. No CVA tenderness. Lungs: clear to auscultation bilaterally Heart: regular rate and rhythm, S1, S2 normal, no murmur, click, rub or gallop Abdomen: soft, non-tender; bowel sounds normal; no masses,   no organomegaly Pulses: 2+ and symmetric Skin: Skin color, texture, turgor normal. No rashes or lesions Lymph nodes: Cervical, supraclavicular, and axillary nodes normal. Neuro: Neuro: CNs 2-12 intact. DTRs 1+/4 in  [place], not elicited in achilles. Muscle strength 5/5 in upper and lower exremities. Negative straight leg lift.   Gait normal.  MSK: normal ROM both hips.    Assessment and Plan:  Osteoporosis, unspecified He received his first Prolia injection in late July after waiting two months for his insurance to approve the medication,   Back pain His neurologic exam is normal except for DTRs.  Given his history of osteoporosis and prostate CA, unclear whether the sacral pain bilateral is coming from a new SI fracture,  prior L2 fracture, or bony mets.  Plain films of pelvis ordered to rule out the former and latter.Referral to Earle Gell requested and will take place pending review of films to be done tomorrow,  Recommended using naprosyn/tylenol    Updated Medication List Outpatient Encounter Prescriptions as of 02/25/2014  Medication Sig  . Calcium Carb-Cholecalciferol (CALCIUM 1000 + D PO) Take 1 tablet by mouth daily.  [name] denosumab (PROLIA) 60 MG/ML SOLN injection Inject 60 mg into the skin once. Administer in upper arm, thigh, or abdomen  . Glucosamine-Chondroit-Vit C-Mn (GLUCOSAMINE CHONDR 1500 COMPLX) CAPS Take 1 capsule by mouth daily.   [name] loratadine (CLARITIN) 10 MG tablet Take 10 mg by mouth daily as needed.   . Multiple Vitamins-Minerals (PRESERVISION AREDS 2) CAPS Take 2 capsules by mouth daily.  . Omega-3 Fatty Acids (FISH OIL MAXIMUM STRENGTH) 1200 MG CAPS Take 1 capsule by mouth daily.   [name] omeprazole (PRILOSEC) 40 MG capsule Take 1 capsule (40 mg total) by mouth daily.  . TDaP (BOOSTRIX) 5-2.5-18.5 LF-MCG/0.5 injection Inject 0.5 mLs into the muscle once.  . zoster vaccine live, PF, (ZOSTAVAX) 93267 UNT/0.65ML injection Inject 19,400 Units into the skin once.  .  [DISCONTINUED] ergocalciferol (DRISDOL) 50000 UNITS capsule Take 1 capsule (50,000 Units total) by mouth once a week.     Orders Placed This Encounter  Procedures  . DG Pelvis Comp Min 3V    No Follow-up on file.

## 2014-02-25 NOTE — Progress Notes (Signed)
Pre visit review using our clinic review tool, if applicable. No additional management support is needed unless otherwise documented below in the visit note. 

## 2014-02-25 NOTE — Patient Instructions (Signed)
I want to make you do not have a new fracture so plain flms of the pelvis have been ordered  Referral to Earle Gell (neurosurgeon) pending review of plain films

## 2014-02-25 NOTE — Assessment & Plan Note (Addendum)
His neurologic exam is normal except for DTRs.  Given his history of osteoporosis and prostate CA, unclear whether the sacral pain bilateral is coming from a new SI fracture,  prior L2 fracture, or bony mets.  Plain films of pelvis ordered to rule out the former and latter.Referral to Earle Gell requested and will take place pending review of films to be done tomorrow,  Recommended using naprosyn/tylenol

## 2014-02-26 ENCOUNTER — Ambulatory Visit
Admission: RE | Admit: 2014-02-26 | Discharge: 2014-02-26 | Disposition: A | Discharge status: Home / Self Care | Payer: Medicare Other | Source: Ambulatory Visit | Attending: Internal Medicine | Admitting: Internal Medicine

## 2014-02-26 ENCOUNTER — Ambulatory Visit: Admission: RE | Admit: 2014-02-26 | Payer: Medicare Other | Source: Ambulatory Visit

## 2014-02-26 ENCOUNTER — Telehealth: Payer: Self-pay | Admitting: Internal Medicine

## 2014-02-26 DIAGNOSIS — M48061 Spinal stenosis, lumbar region without neurogenic claudication: Secondary | ICD-10-CM | POA: Insufficient documentation

## 2014-02-26 DIAGNOSIS — M7918 Myalgia, other site: Secondary | ICD-10-CM

## 2014-02-26 NOTE — Telephone Encounter (Signed)
Pt dropped off CD of L-spine from [facility]. Disk in Dr. Lupita Dawn [email or web address]

## 2014-02-26 NOTE — Telephone Encounter (Signed)
Pt also has reports from MRI with disk, spoke to patient, advised to pick up disk for his records. States he believes you wanted to see the imaging or are reports enough?

## 2014-02-26 NOTE — Telephone Encounter (Signed)
Thank him for the copies.  He will need to take the images on a CD obtained from Radiology when he goes to see Dr Arnoldo Morale.

## 2014-02-27 ENCOUNTER — Other Ambulatory Visit: Payer: Self-pay | Admitting: Internal Medicine

## 2014-02-27 ENCOUNTER — Ambulatory Visit (INDEPENDENT_AMBULATORY_CARE_PROVIDER_SITE_OTHER)
Admission: RE | Admit: 2014-02-27 | Discharge: 2014-02-27 | Disposition: A | Discharge status: Home / Self Care | Payer: Medicare Other | Source: Ambulatory Visit | Attending: Internal Medicine | Admitting: Internal Medicine

## 2014-02-27 ENCOUNTER — Encounter: Payer: Self-pay | Admitting: Internal Medicine

## 2014-02-27 DIAGNOSIS — M545 Low back pain, unspecified: Secondary | ICD-10-CM | POA: Diagnosis not present

## 2014-02-27 DIAGNOSIS — M5432 Sciatica, left side: Secondary | ICD-10-CM

## 2014-02-27 DIAGNOSIS — IMO0001 Reserved for inherently not codable concepts without codable children: Secondary | ICD-10-CM

## 2014-03-10 ENCOUNTER — Telehealth: Payer: Self-pay | Admitting: Internal Medicine

## 2014-03-10 NOTE — Telephone Encounter (Signed)
Patient called because he has heard nothing on Neurosurgery referral ? Please advise.

## 2014-03-10 NOTE — Telephone Encounter (Signed)
Referral was placed on sept 10th.  What is going on

## 2014-03-12 NOTE — Telephone Encounter (Addendum)
The referral has been sent to [facility]. Called the patient, he has not heard from the scheduler at [facility].  [facility] [phone]) spoke with Thayer Headings, she transferred the call to a new patient coordinator, spoke with the new patient coordinator,Chris, she stated the patient has not been scheduled due to still being in their "que". She has received the referral,  But has not processed the referral on their end.

## 2014-03-19 ENCOUNTER — Ambulatory Visit (INDEPENDENT_AMBULATORY_CARE_PROVIDER_SITE_OTHER): Payer: Medicare Other | Admitting: Internal Medicine

## 2014-03-19 ENCOUNTER — Encounter: Payer: Self-pay | Admitting: Internal Medicine

## 2014-03-19 VITALS — BP 118/72 | HR 66 | Temp 97.6°F | Resp 14 | Ht 76.75 in | Wt 194.5 lb

## 2014-03-19 DIAGNOSIS — G589 Mononeuropathy, unspecified: Secondary | ICD-10-CM

## 2014-03-19 DIAGNOSIS — M810 Age-related osteoporosis without current pathological fracture: Secondary | ICD-10-CM | POA: Diagnosis not present

## 2014-03-19 DIAGNOSIS — M48061 Spinal stenosis, lumbar region without neurogenic claudication: Secondary | ICD-10-CM

## 2014-03-19 DIAGNOSIS — Z8546 Personal history of malignant neoplasm of prostate: Secondary | ICD-10-CM

## 2014-03-19 DIAGNOSIS — G629 Polyneuropathy, unspecified: Secondary | ICD-10-CM

## 2014-03-19 DIAGNOSIS — Z0000 Encounter for general adult medical examination without abnormal findings: Secondary | ICD-10-CM | POA: Diagnosis not present

## 2014-03-19 LAB — PSA: PSA: 0.81 ng/mL (ref 0.10–4.00)

## 2014-03-19 LAB — VITAMIN B12: Vitamin B-12: 306 pg/mL (ref 211–911)

## 2014-03-19 NOTE — Assessment & Plan Note (Signed)
Loss of sensation confirmed on exam today.  Checking TSH, B12 and folate.  Referral to neurology for EMG/nerve conduction studies given concurrent DDD lumbar spine by March 2015 MRI

## 2014-03-19 NOTE — Assessment & Plan Note (Addendum)
His PSA had doubled in June at Dr.  Metro Kung office to 1.2 He is requesting a recheck  today.

## 2014-03-19 NOTE — Assessment & Plan Note (Signed)
Managed with Prolia after a lengthy period of certification/authorization

## 2014-03-19 NOTE — Assessment & Plan Note (Signed)

## 2014-03-19 NOTE — Patient Instructions (Signed)

## 2014-03-19 NOTE — Assessment & Plan Note (Signed)
With decreased sensation and DTRS in feet, and back pain aggravated by supine position.  MRI done in March 2015 at [facility] .  Neurosurgical referral in progress

## 2014-03-19 NOTE — Progress Notes (Signed)
Patient ID: George Owens, male   DOB: 06-25-1931, 80 y.o.   MRN: [id number]  The patient is here for annual Medicare wellness examination and management of other chronic and acute problems.  Back pain worse at night, not with walking.  Using naprosyn   Worried about neuropathy in the left leg getting worse and becomin irreversible.  Symptoms  Of leg weakness , and bottom of feet have begun to burn for 1-2 hours tin the evening  .  Otherwise feet feel cold      The risk factors are reflected in the social history.  The roster of all physicians providing medical care to patient - is listed in the Snapshot section of the chart.  Activities of daily living:  The patient is 100% independent in all ADLs: dressing, toileting, feeding as well as independent mobility  Home safety : The patient has smoke detectors in the home. They wear seatbelts.  There are no firearms at home. There is no violence in the home.     There is no risks for hepatitis, STDs or HIV. There is no   history of blood transfusion. They have no travel history to infectious disease endemic areas of the world.  The patient has seen their dentist in the last six month. They have seen their eye doctor in the last year. They admit to slight hearing difficulty with regard to whispered voices and some television programs.  They have deferred audiologic testing in the last year.  They do not  have excessive sun exposure. Discussed the need for sun protection: hats, long sleeves and use of sunscreen if there is significant sun exposure.   Diet: the importance of a healthy diet is discussed. They do have a healthy diet.  The benefits of regular aerobic exercise were discussed. She walks 4 times per week ,  20 minutes.   Depression screen: there are no signs or vegative symptoms of depression- irritability, change in appetite, anhedonia, but he does experience sadness/tearfullness when discussing the loss of his 49 yr old granddaughter and  recent diagnosis of metastatic cancer in a daughter. .  Cognitive assessment: the patient manages all their financial and personal affairs and is actively engaged. They could relate day,date,year and events; recalled 2/3 objects at 3 minutes; performed clock-face test normally.  The following portions of the patient's history were reviewed and updated as appropriate: allergies, current medications, past family history, past medical history,  past surgical history, past social history  and problem list.  Visual acuity was not assessed per patient preference since she has regular follow up with her ophthalmologist. Hearing and body mass index were assessed and reviewed.   During the course of the visit the patient was educated and counseled about appropriate screening and preventive services including : fall prevention , diabetes screening, nutrition counseling, colorectal cancer screening, and recommended immunizations.    Objective:   BP 118/72  Pulse 66  Temp(Src) 97.6 F (36.4 C) (Oral)  Resp 14  Ht 6' 4.75" (1.949 m)  Wt 194 lb 8 oz (88.225 kg)  BMI 23.23 kg/m2  SpO2 93%  General Appearance:    Alert, cooperative, no distress, appears stated age  Head:    Normocephalic, without obvious abnormality, atraumatic  Eyes:    PERRL, conjunctiva/corneas clear, EOM's intact, fundi    benign, both eyes       Ears:    Normal TM's and external ear canals, both ears  Nose:   Nares normal, septum midline,  mucosa normal, no drainage   or sinus tenderness  Throat:   Lips, mucosa, and tongue normal; teeth and gums normal  Neck:   Supple, symmetrical, trachea midline, no adenopathy;       thyroid:  No enlargement/tenderness/nodules; no carotid   bruit or JVD  Back:     Symmetric, no curvature, ROM normal, no CVA tenderness  Lungs:     Clear to auscultation bilaterally, respirations unlabored  Chest wall:    No tenderness or deformity  Heart:    Regular rate and rhythm, S1 and S2 normal, no  murmur, rub   or gallop  Abdomen:     Soft, non-tender, bowel sounds active all four quadrants,    no masses, no organomegaly  Genitalia:    Deferred    Rectal:    Deferred  Extremities:   Extremities normal, atraumatic, no cyanosis or edema  Pulses:   2+ and symmetric all extremities  Skin:   Skin color, texture, turgor normal, no rashes or lesions  Lymph nodes:   Cervical, supraclavicular, and axillary nodes normal  Neurologic:   CNII-XII intact. Normal strength, decreased sensation on the palmar surface of both feet,  Normal  reflexes      Throughout excluding achilles ( no achilles reflex)    Assessment and Plan:  History of prostate cancer His PSA had doubled in June at Dr.  Metro Kung office to 1.2 He is requesting a recheck  today.     Neuropathy Loss of sensation confirmed on exam today.  Checking TSH, B12 and folate.  Referral to neurology for EMG/nerve conduction studies given concurrent DDD lumbar spine by March 2015 MRI   Routine general medical examination at a health care facility Annual Medicare wellness  exam was done as well as a comprehensive physical exam and management of acute and chronic conditions .  During the course of the visit the patient was educated and counseled about appropriate screening and preventive services including : fall prevention , diabetes screening, nutrition counseling, colorectal cancer screening, and recommended immunizations.  Printed recommendations for health maintenance screenings was given.   Spinal stenosis of lumbar region With decreased sensation and DTRS in feet, and back pain aggravated by supine position.  MRI done in March 2015 at [facility] .  Neurosurgical referral in progress  Osteoporosis, unspecified Managed with Prolia after a lengthy period of certification/authorization    Updated Medication List Outpatient Encounter Prescriptions as of 03/19/2014  Medication Sig  . Calcium Carb-Cholecalciferol (CALCIUM 1000 + D PO) Take 1  tablet by mouth daily.  [name] denosumab (PROLIA) 60 MG/ML SOLN injection Inject 60 mg into the skin once. Administer in upper arm, thigh, or abdomen  . Glucosamine-Chondroit-Vit C-Mn (GLUCOSAMINE CHONDR 1500 COMPLX) CAPS Take 1 capsule by mouth daily.   [name] loratadine (CLARITIN) 10 MG tablet Take 10 mg by mouth daily as needed.   . Multiple Vitamins-Minerals (PRESERVISION AREDS 2) CAPS Take 2 capsules by mouth daily.  . Omega-3 Fatty Acids (FISH OIL MAXIMUM STRENGTH) 1200 MG CAPS Take 1 capsule by mouth daily.   [name] omeprazole (PRILOSEC) 40 MG capsule Take 1 capsule (40 mg total) by mouth daily.  . [DISCONTINUED] TDaP (BOOSTRIX) 5-2.5-18.5 LF-MCG/0.5 injection Inject 0.5 mLs into the muscle once.  . [DISCONTINUED] zoster vaccine live, PF, (ZOSTAVAX) 40981 UNT/0.65ML injection Inject 19,400 Units into the skin once.

## 2014-03-21 ENCOUNTER — Encounter: Payer: Self-pay | Admitting: Neurology

## 2014-03-21 ENCOUNTER — Ambulatory Visit (INDEPENDENT_AMBULATORY_CARE_PROVIDER_SITE_OTHER): Payer: Medicare Other | Admitting: Neurology

## 2014-03-21 VITALS — BP 110/60 | HR 72 | Ht 77.0 in | Wt 194.6 lb

## 2014-03-21 DIAGNOSIS — G609 Hereditary and idiopathic neuropathy, unspecified: Secondary | ICD-10-CM | POA: Diagnosis not present

## 2014-03-21 DIAGNOSIS — M4806 Spinal stenosis, lumbar region: Secondary | ICD-10-CM

## 2014-03-21 DIAGNOSIS — M48061 Spinal stenosis, lumbar region without neurogenic claudication: Secondary | ICD-10-CM

## 2014-03-21 NOTE — Patient Instructions (Signed)
1. Check blood work  2.  EMG of the legs 3.  Return to clinic in 26-months

## 2014-03-21 NOTE — Progress Notes (Signed)
[facility] Clinic Note - Initial Visit   Date: 03/21/2014  KWAKU MOSTAFA MRN: [id number] DOB: May 15, 1932   Dear Dr. Derrel Nip:  Thank you for your kind referral of KENO CARAWAY for consultation of neuropathy. Although his history is well known to you, please allow [place] to reiterate it for the purpose of our medical record. The patient was accompanied to the clinic by self.   History of Present Illness: DOREAN DANIELLO is a 80 y.o. left-handed Caucasian male with history of hyperlipidemia, GERD, prostate cancer s/p radiation (2006) presenting for evaluation of back pain and bilateral feet pain.    In 2005, he had epidural injection for left sciatica.  Since then he has ongoing problems with back pain and left leg numbness.  In January 2015, he developed acute onset of low back pain and due to concern of metastatic disease from prostate cancer, he underwent MRI lumbar spine which showed acute L2 compression fracture and spinal stenosis at L3-4 (mild) and L4-5 (mild-moderate).  He has numbness over the left lateral lower leg and foot and is concerned that the numbness is worsening. Denies any similar symptoms of the right foot. He has sensation of weakness, but has not had any falls.  Back pain is described as achy and worse only when lying flat.  Out-side paper records, electronic medical record, and images have been reviewed where available and summarized as:  MRI lumbar spine 09/01/2013: 1.  Relatively acute, moderate L2 compression fracture.  No metastatic disease identified at this level. 2.  4mm sclerotic lesion with mild surrounding edema and enhancement in the L4 vertebral body.   3.  Two small foci of enhancement in the L5 vertebral body, favored to be degenerative 4.  Mild to moderate multilevel degenerative disc disease and congentially short pedicles resulting in mild spinal stenosis at L3-4 and mild to moderate spinal stenosis at L4-5.  Lab Results    Component Value Date   TSH 2.98 09/09/2013   Lab Results  Component Value Date   VITAMINB12 306 03/19/2014      Past Medical History  Diagnosis Date  . Chicken pox   . Colon polyps   . Neuropathy   . Leg cramps   . Hyperlipidemia   . history of prostate CA   . Allergy     Past Surgical History  Procedure Laterality Date  . Tonsilectomy, adenoidectomy, bilateral myringotomy and tubes    . Eye surgery       Medications:  Current Outpatient Prescriptions on File Prior to Visit  Medication Sig Dispense Refill  . Calcium Carb-Cholecalciferol (CALCIUM 1000 + D PO) Take 1 tablet by mouth daily.      [name] denosumab (PROLIA) 60 MG/ML SOLN injection Inject 60 mg into the skin once. Administer in upper arm, thigh, or abdomen  1 mL  1  . Glucosamine-Chondroit-Vit C-Mn (GLUCOSAMINE CHONDR 1500 COMPLX) CAPS Take 1 capsule by mouth daily.       [name] loratadine (CLARITIN) 10 MG tablet Take 10 mg by mouth daily as needed.       . Multiple Vitamins-Minerals (PRESERVISION AREDS 2) CAPS Take 2 capsules by mouth daily.      . Omega-3 Fatty Acids (FISH OIL MAXIMUM STRENGTH) 1200 MG CAPS Take 1 capsule by mouth daily.       [name] omeprazole (PRILOSEC) 40 MG capsule Take 1 capsule (40 mg total) by mouth daily.  30 capsule  5   No current facility-administered medications on file  prior to visit.    Allergies:  Allergies  Allergen Reactions  . Kenalog [Triamcinolone Acetonide]     Blindness X 3 Days    Family History: Family History  Problem Relation Age of Onset  . Stroke Mother   . Arthritis Father   . Heart disease Father     Social History: History   Social History  . Marital Status: Married    Spouse Name: N/A    Number of Children: N/A  . Years of Education: N/A   Occupational History  . Not on file.   Social History Main Topics  . Smoking status: Never Smoker   . Smokeless tobacco: Never Used  . Alcohol Use: No  . Drug Use: No  . Sexual Activity: Yes    Partners: Female    Other Topics Concern  . Not on file   Social History Narrative   Lives with wife in a 5 story retirement home.  He lives on the first floor.  4 year college degree.  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      Review of Systems:  CONSTITUTIONAL: No fevers, chills, night sweats, or weight loss.  EYES: No visual changes or eye pain ENT: No hearing changes.  No history of nose bleeds.   RESPIRATORY: No cough, wheezing and shortness of breath.   CARDIOVASCULAR: Negative for chest pain, and palpitations.   GI: Negative for abdominal discomfort, blood in stools or black stools.  No recent change in bowel habits.   GU:  No history of incontinence.   MUSCLOSKELETAL: + history of joint pain or swelling.  No myalgias.   SKIN: Negative for lesions, rash, and itching.   HEMATOLOGY/ONCOLOGY: Negative for prolonged bleeding, bruising easily, and swollen nodes.     ENDOCRINE: Negative for cold or heat intolerance, polydipsia or goiter.   PSYCH:  No depression or anxiety symptoms.   NEURO: As Above.   Vital Signs:  BP 110/60  Pulse 72  Ht 6\' 5"  (1.956 m)  Wt 194 lb 9 oz (88.253 kg)  BMI 23.07 kg/m2  SpO2 94%   Neurological Exam: MENTAL STATUS including orientation to time, place, person, recent and remote memory, attention span and concentration, language, and fund of knowledge is normal.  Speech is not dysarthric.  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   III-IV-VI: Pupils equal round and reactive to light.  Normal conjugate, extra-ocular eye movements in all directions of gaze.  No nystagmus.  Left ptosis (old).   V:  Normal facial sensation.  VII:  Normal facial symmetry and movements.  No pathologic facial reflexes.  VIII:  Normal hearing and vestibular function.   IX-X:  Normal palatal  movement.   XI:  Normal shoulder shrug and head rotation.   XII:  Normal tongue strength and range of motion, no deviation or fasciculation.  MOTOR:  No atrophy, fasciculations or abnormal movements.  No pronator drift.  Tone is normal.    Right Upper Extremity:    Left Upper Extremity:    Deltoid  5/5   Deltoid  5/5   Biceps  5/5   Biceps  5/5   Triceps  5/5   Triceps  5/5   Wrist extensors  5/5   Wrist extensors  5/5   Wrist flexors  5/5   Wrist flexors  5/5  Finger extensors  5/5   Finger extensors  5/5   Finger flexors  5/5   Finger flexors  5/5   Dorsal interossei  5/5   Dorsal interossei  5/5   Abductor pollicis  5/5   Abductor pollicis  5/5   Tone (Ashworth scale)  0  Tone (Ashworth scale)  0   Right Lower Extremity:    Left Lower Extremity:    Hip flexors  5/5   Hip flexors  5/5   Hip extensors  5/5   Hip extensors  5/5   Knee flexors  5/5   Knee flexors  5/5   Knee extensors  5/5   Knee extensors  5/5   Dorsiflexors  5/5   Dorsiflexors  5/5   Plantarflexors  5/5   Plantarflexors  5/5   Toe extensors  5/5   Toe extensors  5/5   Toe flexors  5/5   Toe flexors  5/5   Tone (Ashworth scale)  0  Tone (Ashworth scale)  0   MSRs:  Right                                                                 Left brachioradialis 2+  brachioradialis 2+  biceps 2+  biceps 2+  triceps 2+  triceps 2+  patellar 1+  patellar 1+  ankle jerk 0  ankle jerk 0  Hoffman no  Hoffman no  plantar response down  plantar response down   SENSORY:  Vibration is reduced distal to the great toe bilaterally, otherwise normal and symmetric perception of light touch, pinprick, and proprioception.   Rhomberg sign is negative.  COORDINATION/GAIT: Normal finger-to- nose-finger and heel-to-shin.  Intact rapid alternating movements bilaterally.  Able to rise from a chair without using arms.  Gait narrow based and stable. Tandem and stressed gait intact.    IMPRESSION: Mr. Schroepfer is a 80 year-old gentleman  presenting for evaluation of left lower leg numbness and chronic back pain. A lumbosacral radiculopathy involving the left L4-5 nerve roots could also manifest with symptoms over this distribution and there is evidence of moderate spinal stenosis at L4-5.  Alternatively, his dysesthesias may also be stemming for peripheral neuropathy which is consistent with his exam, however symptoms would be expected to be more symmetrical and distal.  EMG of the legs will be performed to better localize his symptoms.  For completeness, I will also screen for treatable causes of neuropathy.   PLAN/RECOMMENDATIONS:  1.  Check copper, ceruloplasmin, SPEP/UPEP with IFE 2.  EMG of the left > right legs 3.  Return to clinic in 2- months.   The duration of this appointment visit was 50 minutes of face-to-face time with the patient.  Greater than 50% of this time was spent in counseling, explanation of diagnosis, planning of further management, and coordination of care.   Thank you for allowing me to participate in patient's care.  If I can answer any additional questions, I would be pleased to do so.    Sincerely,    Shantai Tiedeman K. Posey Pronto, DO

## 2014-03-22 LAB — METHYLMALONIC ACID, SERUM: Methylmalonic Acid, Quant: 234 nmol/L (ref 87–318)

## 2014-03-23 LAB — COPPER, SERUM: Copper: 88 ug/dL (ref 70–175)

## 2014-03-24 LAB — CERULOPLASMIN: Ceruloplasmin: 23 mg/dL (ref 18–36)

## 2014-03-25 LAB — SPEP & IFE WITH QIG
Albumin ELP: 63.8 % (ref 55.8–66.1)
Alpha-1-Globulin: 3.6 % (ref 2.9–4.9)
Alpha-2-Globulin: 10.1 % (ref 7.1–11.8)
Beta 2: 5 % (ref 3.2–6.5)
Beta Globulin: 5.9 % (ref 4.7–7.2)
Gamma Globulin: 11.6 % (ref 11.1–18.8)
IgA: 261 mg/dL (ref 68–379)
IgG (Immunoglobin G), Serum: 862 mg/dL (ref 650–1600)
IgM, Serum: 42 mg/dL (ref 41–251)
Total Protein, Serum Electrophoresis: 6.7 g/dL (ref 6.0–8.3)

## 2014-03-25 LAB — UIFE/LIGHT CHAINS/TP QN, 24-HR UR
Albumin, U: DETECTED
Total Protein, Urine: 6 mg/dL (ref 5–25)

## 2014-04-03 ENCOUNTER — Ambulatory Visit (INDEPENDENT_AMBULATORY_CARE_PROVIDER_SITE_OTHER): Payer: Medicare Other | Admitting: Neurology

## 2014-04-03 DIAGNOSIS — M48061 Spinal stenosis, lumbar region without neurogenic claudication: Secondary | ICD-10-CM

## 2014-04-03 DIAGNOSIS — G609 Hereditary and idiopathic neuropathy, unspecified: Secondary | ICD-10-CM

## 2014-04-03 NOTE — Procedures (Signed)
[facility]  [address]  [place], [place] [address] Tel: [phone] Fax:  [phone] Test Date:  04/03/2014  Patient: George Owens DOB: 11/10/1931 Physician: Narda Amber, DO  Sex: Male Height: 6\' 5"  Ref Phys: Narda Amber  ID#: [id number] Temp: 31.0C Technician: Laureen Ochs R. NCS T.   Patient Complaints: Patient is a 21 year olf male here for evaluation of numbness of the feet, worse on the left.   NCV & EMG Findings: Extensive electrodiagnostic testing of the left lower extremity and additional studies of the right reveals:  1. Bilateral sural sensory responses are absent and superficial peroneal sensory responses are reduced in amplitude. 2. Tibial motor response and peroneal motor response (EDB) are markedly reduced in amplitude. Peroneal motor response recording at the tibialis anterior is within normal limits. 3. Chronic motor axon loss changes are seen affecting the anterior tibialis, medial gastrocnemius, and flexor digitorum longus muscles, without accompanied active denervation.   Impression: 1. The electrophysiologic findings are most consistent with a chronic distal and symmetric sensorimotor polyneuropathy, axon loss in type, affecting bilateral lower extremities. Overall, these findings are moderate in degree electrically. 2. There is no definite evidence of a superimposed lumbosacral radiculopathy affecting the lower extremities.   ___________________________ Narda Amber, DO    Nerve Conduction Studies Anti Sensory Summary Table   Site NR Peak (ms) Norm Peak (ms) P-T Amp (V) Norm P-T Amp  Left Sup Peroneal Anti Sensory (Ant Lat Mall)  31C  12 cm    3.5 <4.6 2.3 >3  Right Sup Peroneal Anti Sensory (Ant Lat Mall)  31C  12 cm    2.1 <4.6 2.2 >3  Left Sural Anti Sensory (Lat Mall)  31C  Calf NR  <4.6  >3  Right Sural Anti Sensory (Lat Mall)  31C  Calf NR  <4.6  >3   Motor Summary Table   Site NR Onset (ms) Norm Onset  (ms) O-P Amp (mV) Norm O-P Amp Site1 Site2 Delta-0 (ms) Dist (cm) Vel (m/s) Norm Vel (m/s)  Left Peroneal Motor (Ext Dig Brev)  31C  Ankle    5.2 <6.0 2.2 >2.5 B Fib Ankle 8.4 34.0 40 >40  B Fib    13.6  1.8  Poplt B Fib 2.5 11.0 44 >40  Poplt    16.1  1.8         Right Peroneal Motor (Ext Dig Brev)  31C  Ankle    3.9 <6.0 2.2 >2.5 B Fib Ankle 8.6 38.0 44 >40  B Fib    12.5  1.6  Poplt B Fib 2.2 13.0 59 >40  Poplt    14.7  1.6         Left Peroneal TA Motor (Tib Ant)  31C  Fib Head    3.1 <4.5 5.0 >3 Poplit Fib Head 1.7 10.0 59 >40  Poplit    4.8  4.3         Right Peroneal TA Motor (Tib Ant)  31C  Fib Head    2.7 <4.5 5.4 >3 Poplit Fib Head 2.7 12.0 44 >40  Poplit    5.4  5.3         Left Tibial Motor (Abd Hall Brev)  31C  Ankle    4.5 <6.0 1.4 >4 Knee Ankle 12.1 46.0 38 >40  Knee    16.6  0.8         Right Tibial Motor (Abd Hall Brev)  31C  Ankle  4.6 <6.0 1.0 >4 Knee Ankle 11.6 46.0 40 >40  Knee    16.2  0.9          EMG   Side Muscle Ins Act Fibs Psw Fasc Number Recrt Dur Dur. Amp Amp. Poly Poly. Comment  Right AntTibialis Nml Nml Nml Nml 2- Rapid Some 1+ Some 2+ Some 1+ N/A  Right Gastroc Nml Nml Nml Nml 2- Mod-V Some 1+ Nml Nml Nml Nml N/A  Right Flex Dig Long Nml Nml Nml Nml 2- Rapid Some 1+ Some 1+ Nml Nml N/A  Right RectFemoris Nml Nml Nml Nml Nml Nml Nml Nml Nml Nml Nml Nml N/A  Right GluteusMed Nml Nml Nml Nml Nml Nml Nml Nml Nml Nml Nml Nml N/A  Right BicepsFemS Nml Nml Nml Nml Nml Nml Nml Nml Nml Nml Nml Nml N/A  Left BicepsFemS Nml Nml Nml Nml Nml Nml Nml Nml Nml Nml Nml Nml N/A  Left AntTibialis Nml Nml Nml Nml 2- Rapid Some 1+ Some 1+ Some 1+ N/A  Left Flex Dig Long Nml Nml Nml Nml 2- Rapid Some 1+ Nml Nml Nml Nml N/A  Left Gastroc Nml Nml Nml Nml 2- Mod-R Some 1+ Some Nml Nml Nml N/A  Left RectFemoris Nml Nml Nml Nml Nml Nml Nml Nml Nml Nml Nml Nml N/A  Left GluteusMed Nml Nml Nml Nml Nml Nml Nml Nml Nml Nml Nml Nml N/A      Waveforms:

## 2014-04-15 ENCOUNTER — Telehealth: Payer: Self-pay | Admitting: Neurology

## 2014-04-15 DIAGNOSIS — M545 Low back pain: Secondary | ICD-10-CM | POA: Diagnosis not present

## 2014-04-15 DIAGNOSIS — S32000B Wedge compression fracture of unspecified lumbar vertebra, initial encounter for open fracture: Secondary | ICD-10-CM | POA: Diagnosis not present

## 2014-04-15 DIAGNOSIS — M5416 Radiculopathy, lumbar region: Secondary | ICD-10-CM | POA: Diagnosis not present

## 2014-04-15 NOTE — Telephone Encounter (Signed)
Becky w/ [employer] called requesting copy of pt's NCV for continued care. Report faxed as requested op Becky # [phone] / Sherri S.

## 2014-04-23 ENCOUNTER — Telehealth: Payer: Self-pay | Admitting: Neurology

## 2014-04-23 NOTE — Telephone Encounter (Signed)
Pt called to cancel his 04/25/14 appt. He did not give a reason.

## 2014-04-25 ENCOUNTER — Ambulatory Visit: Payer: Medicare Other | Admitting: Neurology

## 2014-05-07 ENCOUNTER — Ambulatory Visit: Payer: Self-pay | Admitting: Neurosurgery

## 2014-05-07 DIAGNOSIS — M4806 Spinal stenosis, lumbar region: Secondary | ICD-10-CM | POA: Diagnosis not present

## 2014-05-07 DIAGNOSIS — S32020D Wedge compression fracture of second lumbar vertebra, subsequent encounter for fracture with routine healing: Secondary | ICD-10-CM | POA: Diagnosis not present

## 2014-05-07 DIAGNOSIS — M5136 Other intervertebral disc degeneration, lumbar region: Secondary | ICD-10-CM | POA: Diagnosis not present

## 2014-05-07 DIAGNOSIS — M8568 Other cyst of bone, other site: Secondary | ICD-10-CM | POA: Diagnosis not present

## 2014-05-07 DIAGNOSIS — M438X6 Other specified deforming dorsopathies, lumbar region: Secondary | ICD-10-CM | POA: Diagnosis not present

## 2014-05-21 DIAGNOSIS — H31012 Macula scars of posterior pole (postinflammatory) (post-traumatic), left eye: Secondary | ICD-10-CM | POA: Diagnosis not present

## 2014-05-21 DIAGNOSIS — H3532 Exudative age-related macular degeneration: Secondary | ICD-10-CM | POA: Diagnosis not present

## 2014-05-29 ENCOUNTER — Ambulatory Visit: Payer: Self-pay | Admitting: Oncology

## 2014-05-29 DIAGNOSIS — G8929 Other chronic pain: Secondary | ICD-10-CM | POA: Diagnosis not present

## 2014-05-29 DIAGNOSIS — C61 Malignant neoplasm of prostate: Secondary | ICD-10-CM | POA: Diagnosis not present

## 2014-05-29 DIAGNOSIS — M1990 Unspecified osteoarthritis, unspecified site: Secondary | ICD-10-CM | POA: Diagnosis not present

## 2014-05-29 DIAGNOSIS — Z79899 Other long term (current) drug therapy: Secondary | ICD-10-CM | POA: Diagnosis not present

## 2014-05-29 LAB — CBC CANCER CENTER
Basophil #: 0 x10 3/mm (ref 0.0–0.1)
Basophil %: 0.7 %
Eosinophil #: 0.1 x10 3/mm (ref 0.0–0.7)
Eosinophil %: 2.3 %
HCT: 41.9 % (ref 40.0–52.0)
HGB: 14 g/dL (ref 13.0–18.0)
Lymphocyte #: 1.2 x10 3/mm (ref 1.0–3.6)
Lymphocyte %: 34.4 %
MCH: 30.1 pg (ref 26.0–34.0)
MCHC: 33.3 g/dL (ref 32.0–36.0)
MCV: 91 fL (ref 80–100)
Monocyte #: 0.3 x10 3/mm (ref 0.2–1.0)
Monocyte %: 8.8 %
Neutrophil #: 1.9 x10 3/mm (ref 1.4–6.5)
Neutrophil %: 53.8 %
Platelet: 134 x10 3/mm — ABNORMAL LOW (ref 150–440)
RBC: 4.63 10*6/uL (ref 4.40–5.90)
RDW: 13.4 % (ref 11.5–14.5)
WBC: 3.5 x10 3/mm — ABNORMAL LOW (ref 3.8–10.6)

## 2014-05-29 LAB — COMPREHENSIVE METABOLIC PANEL
Albumin: 3.5 g/dL (ref 3.4–5.0)
Alkaline Phosphatase: 57 U/L
Anion Gap: 7 (ref 7–16)
BUN: 20 mg/dL — ABNORMAL HIGH (ref 7–18)
Bilirubin,Total: 0.6 mg/dL (ref 0.2–1.0)
Calcium, Total: 8.3 mg/dL — ABNORMAL LOW (ref 8.5–10.1)
Chloride: 105 mmol/L (ref 98–107)
Co2: 33 mmol/L — ABNORMAL HIGH (ref 21–32)
Creatinine: 1.1 mg/dL (ref 0.60–1.30)
EGFR (African American): >60
EGFR (Non-African Amer.): >60
Glucose: 116 mg/dL — ABNORMAL HIGH (ref 65–99)
Osmolality: 292 (ref 275–301)
Potassium: 3.6 mmol/L (ref 3.5–5.1)
SGOT(AST): 30 U/L (ref 15–37)
SGPT (ALT): 47 U/L
Sodium: 145 mmol/L (ref 136–145)
Total Protein: 6.5 g/dL (ref 6.4–8.2)

## 2014-05-30 DIAGNOSIS — S32000B Wedge compression fracture of unspecified lumbar vertebra, initial encounter for open fracture: Secondary | ICD-10-CM | POA: Diagnosis not present

## 2014-05-30 DIAGNOSIS — M5416 Radiculopathy, lumbar region: Secondary | ICD-10-CM | POA: Diagnosis not present

## 2014-05-30 LAB — PSA: PSA: 0.7 ng/mL (ref 0.0–4.0)

## 2014-06-04 DIAGNOSIS — C61 Malignant neoplasm of prostate: Secondary | ICD-10-CM | POA: Diagnosis not present

## 2014-06-04 DIAGNOSIS — M1990 Unspecified osteoarthritis, unspecified site: Secondary | ICD-10-CM | POA: Diagnosis not present

## 2014-06-04 DIAGNOSIS — Z79899 Other long term (current) drug therapy: Secondary | ICD-10-CM | POA: Diagnosis not present

## 2014-06-04 DIAGNOSIS — G8929 Other chronic pain: Secondary | ICD-10-CM | POA: Diagnosis not present

## 2014-06-05 ENCOUNTER — Telehealth: Payer: Self-pay | Admitting: *Deleted

## 2014-06-05 NOTE — Telephone Encounter (Signed)
Pt left VM, stating Prolia would be due in January, would like call back. Left message for pt to return my call.

## 2014-06-05 NOTE — Telephone Encounter (Signed)
Pt will be due for Prolia at the end of January, he is ready to get the authorization going due to how long it took last time to get approval. Thanks

## 2014-06-18 ENCOUNTER — Encounter: Payer: Self-pay | Admitting: *Deleted

## 2014-06-20 ENCOUNTER — Ambulatory Visit: Payer: Self-pay | Admitting: Oncology

## 2014-06-24 NOTE — Telephone Encounter (Signed)
I have electronically sent pt's info for Prolia insurance verification and will notify you once I have a response. Thank you. °

## 2014-07-02 NOTE — Telephone Encounter (Signed)
Mailed unread message to pt  

## 2014-07-17 ENCOUNTER — Other Ambulatory Visit: Payer: Self-pay | Admitting: Internal Medicine

## 2014-07-17 ENCOUNTER — Telehealth: Payer: Self-pay | Admitting: Internal Medicine

## 2014-07-17 NOTE — Telephone Encounter (Signed)
Left message, notifying we are still waiting authorization from insurance, will call him once we hear from insurance.

## 2014-07-17 NOTE — Telephone Encounter (Signed)
Patient would like for you to call concerning approval for Prolia Injections.

## 2014-07-21 NOTE — Telephone Encounter (Signed)
I have rec'd pt's [place] verification and they stated [employer] requires a prior authorization, however, after all the trouble we had last year, I referred back and called [place] department at [phone] to see if there was a prior authorization required. After speaking w/LaTeia she confirmed the same as last year and says there is no p/a required for the Prolia injection. Ref # for the call is [id number].   Pt's primary insurance, [employer] will cover 80% of the Prolia and admin, subject to $166 deductible ($0 met), then his secondary BCBSwill coordinate, subject to $400 deductible ($0 met) and considers remaining expenses at 90% of secondary allowable.  Since pt has not met the deductible for either plan, his estimated responsibility for the injection will be $350.  I have sent a copy of the summary of benefits to be scanned into his chart.  If George Owens cannot afford $350 for the injection, he can call Prolia at [phone] to see if he qualifies for any of their assistance programs.  If he qualifies they will instruct him how to proceed.  Please let me know if you have any questions. Thank you!

## 2014-07-21 NOTE — Telephone Encounter (Signed)
Pt aware of cost,  verbalized understanding, would like to proceed with injection. Prolia ordered. Scheduled nurse visit 07/29/14

## 2014-07-21 NOTE — Telephone Encounter (Signed)
Sent mychart

## 2014-07-29 ENCOUNTER — Ambulatory Visit (INDEPENDENT_AMBULATORY_CARE_PROVIDER_SITE_OTHER): Payer: Medicare Other | Admitting: *Deleted

## 2014-07-29 DIAGNOSIS — M810 Age-related osteoporosis without current pathological fracture: Secondary | ICD-10-CM | POA: Diagnosis not present

## 2014-07-29 MED ORDER — DENOSUMAB 60 MG/ML ~~LOC~~ SOLN
60.0000 mg | Freq: Once | SUBCUTANEOUS | Status: AC
  Administered 2014-07-29: 60 mg via SUBCUTANEOUS

## 2014-07-29 MED ORDER — DENOSUMAB 60 MG/ML ~~LOC~~ SOLN: 60.0000 mg | Freq: Once | SUBCUTANEOUS | Status: DC

## 2014-08-04 ENCOUNTER — Telehealth: Payer: Self-pay | Admitting: Internal Medicine

## 2014-08-04 NOTE — Telephone Encounter (Signed)
Prolia was accidentally ordered through patient pharmacy cancelled order.

## 2014-08-20 DIAGNOSIS — H31012 Macula scars of posterior pole (postinflammatory) (post-traumatic), left eye: Secondary | ICD-10-CM | POA: Diagnosis not present

## 2014-08-20 DIAGNOSIS — H3532 Exudative age-related macular degeneration: Secondary | ICD-10-CM | POA: Diagnosis not present

## 2014-10-15 ENCOUNTER — Other Ambulatory Visit: Payer: Self-pay | Admitting: *Deleted

## 2014-10-15 MED ORDER — OMEPRAZOLE 40 MG PO CPDR: DELAYED_RELEASE_CAPSULE | ORAL | Status: DC

## 2014-10-15 NOTE — Telephone Encounter (Signed)
Fax from pharmacy, requesting 90 day supply 

## 2014-11-18 ENCOUNTER — Telehealth: Payer: Self-pay | Admitting: Internal Medicine

## 2014-11-18 DIAGNOSIS — Z79899 Other long term (current) drug therapy: Secondary | ICD-10-CM

## 2014-11-18 DIAGNOSIS — E559 Vitamin D deficiency, unspecified: Secondary | ICD-10-CM

## 2014-11-18 NOTE — Telephone Encounter (Signed)
YES, NON FASTING LABS ORDERED

## 2014-11-18 NOTE — Telephone Encounter (Signed)
Patient called and ask if he needs to have blood work for the Prolia injections he is currently taking before starting the PA again for Prolia that is due in August. Please advise.

## 2014-11-19 DIAGNOSIS — L570 Actinic keratosis: Secondary | ICD-10-CM | POA: Diagnosis not present

## 2014-11-19 DIAGNOSIS — Z821 Family history of blindness and visual loss: Secondary | ICD-10-CM | POA: Diagnosis not present

## 2014-11-19 DIAGNOSIS — D485 Neoplasm of uncertain behavior of skin: Secondary | ICD-10-CM | POA: Diagnosis not present

## 2014-11-19 DIAGNOSIS — Z85828 Personal history of other malignant neoplasm of skin: Secondary | ICD-10-CM | POA: Diagnosis not present

## 2014-11-19 DIAGNOSIS — X32XXXA Exposure to sunlight, initial encounter: Secondary | ICD-10-CM | POA: Diagnosis not present

## 2014-11-19 DIAGNOSIS — C44619 Basal cell carcinoma of skin of left upper limb, including shoulder: Secondary | ICD-10-CM | POA: Diagnosis not present

## 2014-11-19 NOTE — Telephone Encounter (Signed)
Spoke with pt, pt scheduled lab appoint

## 2014-11-19 NOTE — Telephone Encounter (Signed)
Left message for patient to return call to office. 

## 2014-11-26 ENCOUNTER — Other Ambulatory Visit (INDEPENDENT_AMBULATORY_CARE_PROVIDER_SITE_OTHER): Payer: Medicare Other

## 2014-11-26 DIAGNOSIS — E559 Vitamin D deficiency, unspecified: Secondary | ICD-10-CM

## 2014-11-26 DIAGNOSIS — Z79899 Other long term (current) drug therapy: Secondary | ICD-10-CM | POA: Diagnosis not present

## 2014-11-26 LAB — COMPREHENSIVE METABOLIC PANEL
ALT: 24 U/L (ref 0–53)
AST: 22 U/L (ref 0–37)
Albumin: 4.1 g/dL (ref 3.5–5.2)
Alkaline Phosphatase: 44 U/L (ref 39–117)
BUN: 20 mg/dL (ref 6–23)
CO2: 33 mEq/L — ABNORMAL HIGH (ref 19–32)
Calcium: 9 mg/dL (ref 8.4–10.5)
Chloride: 103 mEq/L (ref 96–112)
Creatinine, Ser: 0.93 mg/dL (ref 0.40–1.50)
GFR: 82.97 mL/min (ref >60.00)
Glucose, Bld: 85 mg/dL (ref 70–99)
Potassium: 3.9 mEq/L (ref 3.5–5.1)
Sodium: 140 mEq/L (ref 135–145)
Total Bilirubin: 0.7 mg/dL (ref 0.2–1.2)
Total Protein: 6.4 g/dL (ref 6.0–8.3)

## 2014-11-26 LAB — VITAMIN D 25 HYDROXY (VIT D DEFICIENCY, FRACTURES): VITD: 27.02 ng/mL — ABNORMAL LOW (ref 30.00–100.00)

## 2014-11-27 ENCOUNTER — Encounter: Payer: Self-pay | Admitting: Internal Medicine

## 2014-12-09 ENCOUNTER — Telehealth: Payer: Self-pay | Admitting: Internal Medicine

## 2014-12-09 NOTE — Telephone Encounter (Signed)
Pt called to state that he does want to get the prolia shot in August. Please advise pt/msn

## 2014-12-09 NOTE — Telephone Encounter (Signed)
Need Pa for Prolia thanks.

## 2014-12-09 NOTE — Telephone Encounter (Signed)
I have electronically sent pt's info for Prolia insurance verification and will notify you once I have a response. Thank you. °

## 2014-12-24 DIAGNOSIS — C44619 Basal cell carcinoma of skin of left upper limb, including shoulder: Secondary | ICD-10-CM | POA: Diagnosis not present

## 2015-01-05 NOTE — Telephone Encounter (Signed)
Making teeth soft and breaking is Not reported as an adverse event from Prolia, He should come in for labs. He should see dentist

## 2015-01-05 NOTE — Telephone Encounter (Signed)
Patient notified and agreed to come into office for labs will call and schedule lab when he has his schedule. Patient has agreed to Prolia injection.

## 2015-01-05 NOTE — Telephone Encounter (Signed)
Called to notify patient of Prolia approval and patient has concerned could Prolia cause degeneration of teeth to become soft and start breaking off, patient stated this has happened twice in the last 2 months.

## 2015-01-05 NOTE — Telephone Encounter (Addendum)
I have rec'd the insurance verification for George Owens Prolia injection.  Medicare will cover 80%, leaving pt w/a 20% co-insurance (approx $180), however, his secondary BCBS plan will coordinate benefits and will consider pt's deductible (he's already met), co-pay, co-insurance up to primary secondary plans allowed amt. So pt's estimated responsibility will be $0.  Please inform pt this is only an estimate and we will not know an exact amt until both insurances have paid.  Also, this year they are not requiring and prior auth, thankfully.   I have sent a copy of the summary of benefits to be scanned into his chart.  Please let me know if you have any questions. Thank you.

## 2015-01-05 NOTE — Telephone Encounter (Signed)
I have submitted a Request for the Prolia, I will notify when the medication has arrived on site.

## 2015-01-08 ENCOUNTER — Telehealth: Payer: Self-pay | Admitting: Internal Medicine

## 2015-01-08 NOTE — Telephone Encounter (Signed)
Called patient to schedule Prolia injection , Patient had question as to what labs would be needed for the crumbling of his teeth he experienced patient had CMET and Vit D in June patient wanted to know would they be more comprehensive than these. Patient will schedule lab and Prolia injection when call is returned.

## 2015-01-08 NOTE — Telephone Encounter (Signed)
I know of no other labs to check other than vitamin D and calcium, and since they were checked in June they do not need to be rechecked.

## 2015-01-08 NOTE — Telephone Encounter (Signed)
Patient advised he will be out of town until Monday.

## 2015-01-09 NOTE — Telephone Encounter (Signed)
Spoke with pt, advised of MDs message.  Pt verbalized understanding 

## 2015-01-27 ENCOUNTER — Ambulatory Visit (INDEPENDENT_AMBULATORY_CARE_PROVIDER_SITE_OTHER): Payer: Medicare Other | Admitting: *Deleted

## 2015-01-27 DIAGNOSIS — M810 Age-related osteoporosis without current pathological fracture: Secondary | ICD-10-CM | POA: Diagnosis not present

## 2015-01-27 MED ORDER — DENOSUMAB 60 MG/ML ~~LOC~~ SOLN
60.0000 mg | Freq: Once | SUBCUTANEOUS | Status: AC
  Administered 2015-01-27: 60 mg via SUBCUTANEOUS

## 2015-02-25 DIAGNOSIS — H31012 Macula scars of posterior pole (postinflammatory) (post-traumatic), left eye: Secondary | ICD-10-CM | POA: Diagnosis not present

## 2015-02-25 DIAGNOSIS — H3532 Exudative age-related macular degeneration: Secondary | ICD-10-CM | POA: Diagnosis not present

## 2015-02-25 DIAGNOSIS — H3531 Nonexudative age-related macular degeneration: Secondary | ICD-10-CM | POA: Diagnosis not present

## 2015-03-25 ENCOUNTER — Encounter: Payer: Self-pay | Admitting: Internal Medicine

## 2015-03-25 ENCOUNTER — Ambulatory Visit (INDEPENDENT_AMBULATORY_CARE_PROVIDER_SITE_OTHER): Payer: Medicare Other | Admitting: Internal Medicine

## 2015-03-25 VITALS — BP 118/64 | HR 86 | Temp 97.9°F | Resp 12 | Ht 77.0 in | Wt 191.4 lb

## 2015-03-25 DIAGNOSIS — Z0000 Encounter for general adult medical examination without abnormal findings: Secondary | ICD-10-CM | POA: Diagnosis not present

## 2015-03-25 DIAGNOSIS — Z8546 Personal history of malignant neoplasm of prostate: Secondary | ICD-10-CM | POA: Diagnosis not present

## 2015-03-25 DIAGNOSIS — R636 Underweight: Secondary | ICD-10-CM

## 2015-03-25 DIAGNOSIS — X32XXXA Exposure to sunlight, initial encounter: Secondary | ICD-10-CM | POA: Diagnosis not present

## 2015-03-25 DIAGNOSIS — R634 Abnormal weight loss: Secondary | ICD-10-CM | POA: Diagnosis not present

## 2015-03-25 DIAGNOSIS — Z85828 Personal history of other malignant neoplasm of skin: Secondary | ICD-10-CM | POA: Diagnosis not present

## 2015-03-25 DIAGNOSIS — E785 Hyperlipidemia, unspecified: Secondary | ICD-10-CM | POA: Diagnosis not present

## 2015-03-25 DIAGNOSIS — L570 Actinic keratosis: Secondary | ICD-10-CM | POA: Diagnosis not present

## 2015-03-25 DIAGNOSIS — M810 Age-related osteoporosis without current pathological fracture: Secondary | ICD-10-CM

## 2015-03-25 DIAGNOSIS — E559 Vitamin D deficiency, unspecified: Secondary | ICD-10-CM

## 2015-03-25 NOTE — Progress Notes (Signed)
Pre-visit discussion using our clinic review tool. No additional management support is needed unless otherwise documented below in the visit note.  

## 2015-03-25 NOTE — Patient Instructions (Signed)
You have lost eight are are now "underweight"   You may want to try the "Orgain"  Organic almond mild because it has more protein (10 mg per 8 ounce) than the other brands of almond milk   Muscle Milk and Premier Protein are also premixed low car b protein drinks you can find at [employer].  Good cereal ,   Health Maintenance, Male A healthy lifestyle and preventative care can promote health and wellness.  Maintain regular health, dental, and eye exams.  Eat a healthy diet. Foods like vegetables, fruits, whole grains, low-fat dairy products, and lean protein foods contain the nutrients you need and are low in calories. Decrease your intake of foods high in solid fats, added sugars, and salt. Get information about a proper diet from your health care provider, if necessary.  Regular physical exercise is one of the most important things you can do for your health. Most adults should get at least 150 minutes of moderate-intensity exercise (any activity that increases your heart rate and causes you to sweat) each week. In addition, most adults need muscle-strengthening exercises on 2 or more days a week.   Maintain a healthy weight. The body mass index (BMI) is a screening tool to identify possible weight problems. It provides an estimate of body fat based on height and weight. Your health care provider can find your BMI and can help you achieve or maintain a healthy weight. For males 20 years and older:  A BMI below 18.5 is considered underweight.  A BMI of 18.5 to 24.9 is normal.  A BMI of 25 to 29.9 is considered overweight.  A BMI of 30 and above is considered obese.  Maintain normal blood lipids and cholesterol by exercising and minimizing your intake of saturated fat. Eat a balanced diet with plenty of fruits and vegetables. Blood tests for lipids and cholesterol should begin at age 71 and be repeated every 5 years. If your lipid or  cholesterol levels are high, you are over age 34, or you are at high risk for heart disease, you may need your cholesterol levels checked more frequently.Ongoing high lipid and cholesterol levels should be treated with medicines if diet and exercise are not working.  If you smoke, find out from your health care provider how to quit. If you do not use tobacco, do not start.  Lung cancer screening is recommended for adults aged 60-80 years who are at high risk for developing lung cancer because of a history of smoking. A yearly low-dose CT scan of the lungs is recommended for people who have at least a 30-pack-year history of smoking and are current smokers or have quit within the past 15 years. A pack year of smoking is smoking an average of 1 pack of cigarettes a day for 1 year (for example, a 30-pack-year history of smoking could mean smoking 1 pack a day for 30 years or 2 packs a day for 15 years). Yearly screening should continue until the smoker has stopped smoking for at least 15 years. Yearly screening should be stopped for people who develop a health problem that would prevent them from having lung cancer treatment.  If you choose to drink alcohol, do not have more than 2 drinks per day. One drink is considered to be 12 oz (360 mL) of beer, 5 oz (150 mL) of wine, or 1.5 oz (45 mL) of liquor.  Avoid the use of street  drugs. Do not share needles with anyone. Ask for help if you need support or instructions about stopping the use of drugs.  High blood pressure causes heart disease and increases the risk of stroke. High blood pressure is more likely to develop in:  People who have blood pressure in the end of the normal range (100-139/85-89 mm Hg).  People who are overweight or obese.  People who are African American.  If you are 46-18 years of age, have your blood pressure checked every 3-5 years. If you are 69 years of age or older, have your blood pressure checked every year. You should have  your blood pressure measured twice--once when you are at a hospital or clinic, and once when you are not at a hospital or clinic. Record the average of the two measurements. To check your blood pressure when you are not at a hospital or clinic, you can use:  An automated blood pressure machine at a pharmacy.  A home blood pressure monitor.  If you are 46-56 years old, ask your health care provider if you should take aspirin to prevent heart disease.  Diabetes screening involves taking a blood sample to check your fasting blood sugar level. This should be done once every 3 years after age 60 if you are at a normal weight and without risk factors for diabetes. Testing should be considered at a younger age or be carried out more frequently if you are overweight and have at least 1 risk factor for diabetes.  Colorectal cancer can be detected and often prevented. Most routine colorectal cancer screening begins at the age of 39 and continues through age 18. However, your health care provider may recommend screening at an earlier age if you have risk factors for colon cancer. On a yearly basis, your health care provider may provide home test kits to check for hidden blood in the stool. A small camera at the end of a tube may be used to directly examine the colon (sigmoidoscopy or colonoscopy) to detect the earliest forms of colorectal cancer. Talk to your health care provider about this at age 65 when routine screening begins. A direct exam of the colon should be repeated every 5-10 years through age 68, unless early forms of precancerous polyps or small growths are found.  People who are at an increased risk for hepatitis B should be screened for this virus. You are considered at high risk for hepatitis B if:  You were born in a country where hepatitis B occurs often. Talk with your health care provider about which countries are considered high risk.  Your parents were born in a high-risk country and you  have not received a shot to protect against hepatitis B (hepatitis B vaccine).  You have HIV or AIDS.  You use needles to inject street drugs.  You live with, or have sex with, someone who has hepatitis B.  You are a man who has sex with other men (MSM).  You get hemodialysis treatment.  You take certain medicines for conditions like cancer, organ transplantation, and autoimmune conditions.  Hepatitis C blood testing is recommended for all people born from [birth year] through 1963 and any individual with known risk factors for hepatitis C.  Healthy men should no longer receive prostate-specific antigen (PSA) blood tests as part of routine cancer screening. Talk to your health care provider about prostate cancer screening.  Testicular cancer screening is not recommended for adolescents or adult males who have no symptoms. Screening includes  self-exam, a health care provider exam, and other screening tests. Consult with your health care provider about any symptoms you have or any concerns you have about testicular cancer.  Practice safe sex. Use condoms and avoid high-risk sexual practices to reduce the spread of sexually transmitted infections (STIs).  You should be screened for STIs, including gonorrhea and chlamydia if:  You are sexually active and are younger than 24 years.  You are older than 24 years, and your health care provider tells you that you are at risk for this type of infection.  Your sexual activity has changed since you were last screened, and you are at an increased risk for chlamydia or gonorrhea. Ask your health care provider if you are at risk.  If you are at risk of being infected with HIV, it is recommended that you take a prescription medicine daily to prevent HIV infection. This is called pre-exposure prophylaxis (PrEP). You are considered at risk if:  You are a man who has sex with other men (MSM).  You are a heterosexual man who is sexually active with multiple  partners.  You take drugs by injection.  You are sexually active with a partner who has HIV.  Talk with your health care provider about whether you are at high risk of being infected with HIV. If you choose to begin PrEP, you should first be tested for HIV. You should then be tested every 3 months for as long as you are taking PrEP.  Use sunscreen. Apply sunscreen liberally and repeatedly throughout the day. You should seek shade when your shadow is shorter than you. Protect yourself by wearing long sleeves, pants, a wide-brimmed hat, and sunglasses year round whenever you are outdoors.  Tell your health care provider of new moles or changes in moles, especially if there is a change in shape or color. Also, tell your health care provider if a mole is larger than the size of a pencil eraser.  A one-time screening for abdominal aortic aneurysm (AAA) and surgical repair of large AAAs by ultrasound is recommended for men aged 72-75 years who are current or former smokers.  Stay current with your vaccines (immunizations).   This information is not intended to replace advice given to you by your health care provider. Make sure you discuss any questions you have with your health care provider.   Document Released: 12/03/2007 Document Revised: 06/27/2014 Document Reviewed: 11/01/2010 Elsevier Interactive Patient Education [employer].

## 2015-03-26 LAB — CBC WITH DIFFERENTIAL/PLATELET
Basophils Absolute: 0 10*3/uL (ref 0.0–0.1)
Basophils Relative: 0.4 % (ref 0.0–3.0)
Eosinophils Absolute: 0.1 10*3/uL (ref 0.0–0.7)
Eosinophils Relative: 2 % (ref 0.0–5.0)
HCT: 44.4 % (ref 39.0–52.0)
Hemoglobin: 14.7 g/dL (ref 13.0–17.0)
Lymphocytes Relative: 29.9 % (ref 12.0–46.0)
Lymphs Abs: 1.4 10*3/uL (ref 0.7–4.0)
MCHC: 33.1 g/dL (ref 30.0–36.0)
MCV: 91.7 fl (ref 78.0–100.0)
Monocytes Absolute: 0.3 10*3/uL (ref 0.1–1.0)
Monocytes Relative: 7 % (ref 3.0–12.0)
Neutro Abs: 2.9 10*3/uL (ref 1.4–7.7)
Neutrophils Relative %: 60.7 % (ref 43.0–77.0)
Platelets: 165 10*3/uL (ref 150.0–400.0)
RBC: 4.84 Mil/uL (ref 4.22–5.81)
RDW: 13.9 % (ref 11.5–15.5)
WBC: 4.7 10*3/uL (ref 4.0–10.5)

## 2015-03-26 LAB — LIPID PANEL
Cholesterol: 203 mg/dL — ABNORMAL HIGH (ref 0–200)
HDL: 40.4 mg/dL (ref >39.00)
NonHDL: 162.84
Total CHOL/HDL Ratio: 5
Triglycerides: 202 mg/dL — ABNORMAL HIGH (ref 0.0–149.0)
VLDL: 40.4 mg/dL — ABNORMAL HIGH (ref 0.0–40.0)

## 2015-03-26 LAB — COMPREHENSIVE METABOLIC PANEL
ALT: 19 U/L (ref 0–53)
AST: 20 U/L (ref 0–37)
Albumin: 4.2 g/dL (ref 3.5–5.2)
Alkaline Phosphatase: 44 U/L (ref 39–117)
BUN: 17 mg/dL (ref 6–23)
CO2: 32 mEq/L (ref 19–32)
Calcium: 9 mg/dL (ref 8.4–10.5)
Chloride: 100 mEq/L (ref 96–112)
Creatinine, Ser: 0.93 mg/dL (ref 0.40–1.50)
GFR: 82.91 mL/min (ref >60.00)
Glucose, Bld: 80 mg/dL (ref 70–99)
Potassium: 3.5 mEq/L (ref 3.5–5.1)
Sodium: 142 mEq/L (ref 135–145)
Total Bilirubin: 0.7 mg/dL (ref 0.2–1.2)
Total Protein: 6.7 g/dL (ref 6.0–8.3)

## 2015-03-26 LAB — TSH: TSH: 4.19 u[IU]/mL (ref 0.35–4.50)

## 2015-03-26 LAB — VITAMIN D 25 HYDROXY (VIT D DEFICIENCY, FRACTURES): VITD: 33.9 ng/mL (ref 30.00–100.00)

## 2015-03-26 LAB — PSA, MEDICARE: PSA: 0.57 ng/ml (ref 0.10–4.00)

## 2015-03-26 LAB — LDL CHOLESTEROL, DIRECT: Direct LDL: 129 mg/dL

## 2015-03-27 DIAGNOSIS — R636 Underweight: Secondary | ICD-10-CM | POA: Insufficient documentation

## 2015-03-27 DIAGNOSIS — Z23 Encounter for immunization: Secondary | ICD-10-CM | POA: Diagnosis not present

## 2015-03-27 NOTE — Assessment & Plan Note (Addendum)
Routine surveillance with PSA done today  Lab Results  Component Value Date   PSA 0.57 03/25/2015   PSA 0.7 05/29/2014   PSA 0.81 03/19/2014

## 2015-03-27 NOTE — Assessment & Plan Note (Signed)
I have reviewed hsr diet and recommended that he increase his protein intake while monitoring his complex carbohydrates and refined sugars.

## 2015-03-27 NOTE — Progress Notes (Signed)
Patient ID: George Owens, male    DOB: [birth year]  Age: 81 y.o. MRN: [id number]  The patient is here for annual Medicare wellness examination and management of other chronic and acute problems.   The risk factors are reflected in the social history.  The roster of all physicians providing medical care to patient - is listed in the Snapshot section of the chart.  Activities of daily living:  The patient is 100% independent in all ADLs: dressing, toileting, feeding as well as independent mobility  Home safety : The patient has smoke detectors in the home. They wear seatbelts.  There are no firearms at home. There is no violence in the home.   There is no risks for hepatitis, STDs or HIV. There is no   history of blood transfusion. They have no travel history to infectious disease endemic areas of the world.  The patient has seen their dentist in the last six month. They have seen their eye doctor in the last year. They admit to slight hearing difficulty with regard to whispered voices and some television programs.  They have deferred audiologic testing in the last year.  They do not  have excessive sun exposure. Discussed the need for sun protection: hats, long sleeves and use of sunscreen if there is significant sun exposure.   Diet: the importance of a healthy diet is discussed. They do have a healthy diet.  The benefits of regular aerobic exercise were discussed. he walks 5 times per week ,  Walking 2 miles daily .   Depression screen: there are no signs or vegative symptoms of depression- irritability, change in appetite, anhedonia, sadness/tearfullness.  Cognitive assessment: the patient manages all their financial and personal affairs and is actively engaged. They could relate day,date,year and events; recalled 2/3 objects at 3 minutes; performed clock-face test normally.  The following portions of the patient's history were reviewed and updated as appropriate: allergies, current  medications, past family history, past medical history,  past surgical history, past social history  and problem list.  Visual acuity was not assessed per patient preference since she has regular follow up with her ophthalmologist. Hearing and body mass index were assessed and reviewed.   During the course of the visit the patient was educated and counseled about appropriate screening and preventive services including : fall prevention , diabetes screening, nutrition counseling, colorectal cancer screening, and recommended immunizations.    CC: The primary encounter diagnosis was Loss of weight. Diagnoses of Hyperlipidemia LDL goal <130, Vitamin D deficiency, History of prostate cancer, Underweight, Medicare annual wellness visit, subsequent, Osteoporosis, and Hyperlipidemia were also pertinent to this visit.  [name] Owens has a past medical history of Chicken pox; Colon polyps; Neuropathy ([place]); Leg cramps; Hyperlipidemia; history of prostate CA; and Allergy.   He has past surgical history that includes Tonsilectomy, adenoidectomy, bilateral myringotomy and tubes and Eye surgery.   His family history includes Arthritis in his father; Heart disease in his father; Stroke in his mother.He reports that he has never smoked. He has never used smokeless tobacco. He reports that he does not drink alcohol or use illicit drugs.  Outpatient Prescriptions Prior to Visit  Medication Sig Dispense Refill  . Calcium Carb-Cholecalciferol (CALCIUM 1000 + D PO) Take 2 tablets by mouth daily.     . Glucosamine-Chondroit-Vit C-Mn (GLUCOSAMINE CHONDR 1500 COMPLX) CAPS Take 1 capsule by mouth daily.     [name] loratadine (CLARITIN) 10 MG tablet Take 10 mg by mouth daily as needed.     [name]  Multiple Vitamins-Minerals (PRESERVISION AREDS 2) CAPS Take 2 capsules by mouth daily.    [name] omeprazole (PRILOSEC) 40 MG capsule TAKE 1 CAPSULE (40 MG TOTAL) BY MOUTH DAILY. 90 capsule 1  . Omega-3 Fatty Acids (FISH OIL MAXIMUM STRENGTH)  1200 MG CAPS Take 1 capsule by mouth daily.      No facility-administered medications prior to visit.    Review of Systems   Patient denies headache, fevers, malaise, unintentional weight loss, skin rash, eye pain, sinus congestion and sinus pain, sore throat, dysphagia,  hemoptysis , cough, dyspnea, wheezing, chest pain, palpitations, orthopnea, edema, abdominal pain, nausea, melena, diarrhea, constipation, flank pain, dysuria, hematuria, urinary  Frequency, nocturia, numbness, tingling, seizures,  Focal weakness, Loss of consciousness,  Tremor, insomnia, depression, anxiety, and suicidal ideation.      Objective:  BP 118/64 mmHg  Pulse 86  Temp(Src) 97.9 F (36.6 C) (Oral)  Resp 12  Ht 6\' 5"  (1.956 m)  Wt 191 lb 6 oz (86.807 kg)  BMI 22.69 kg/m2  SpO2 95%  Physical Exam   General appearance: alert, cooperative and appears stated age Ears: normal TM's and external ear canals both ears Throat: lips, mucosa, and tongue normal; teeth and gums normal Neck: no adenopathy, no carotid bruit, supple, symmetrical, trachea midline and thyroid not enlarged, symmetric, no tenderness/mass/nodules Back: symmetric, no curvature. ROM normal. No CVA tenderness. Lungs: clear to auscultation bilaterally Heart: regular rate and rhythm, S1, S2 normal, no murmur, click, rub or gallop Abdomen: soft, non-tender; bowel sounds normal; no masses,  no organomegaly Pulses: 2+ and symmetric Skin: Skin color, texture, turgor normal. No rashes or lesions Lymph nodes: Cervical, supraclavicular, and axillary nodes normal. Neuro: CNs 2-12 intact. DTRs 2+/4 in biceps, brachioradialis, patellars and achilles. Muscle strength 5/5 in upper and lower exremities. Fine resting tremor bilaterally both hands cerebellar function normal. Romberg negative.  No pronator drift.   Gait normal.     Assessment & Plan:   Problem List Items Addressed This Visit    Hyperlipidemia    Untreated due to history of statin  intolerance. Patient has no history of heart disease or hypertension and is physically active. Continue mediterranean style diet.   Lab Results  Component Value Date   CHOL 203* 03/25/2015   HDL 40.40 03/25/2015   LDLCALC 144* 09/09/2013   LDLDIRECT 129.0 03/25/2015   TRIG 202.0* 03/25/2015   CHOLHDL 5 03/25/2015         Medicare annual wellness visit, subsequent    Annual Medicare wellness  exam was done as well as a comprehensive physical exam and management of acute and chronic conditions .  During the course of the visit the patient was educated and counseled about appropriate screening and preventive services including : fall prevention , diabetes screening, nutrition counseling, colorectal cancer screening, and recommended immunizations.  Printed recommendations for health maintenance screenings was given.       History of prostate cancer    Routine surveillance with PSA done today  Lab Results  Component Value Date   PSA 0.57 03/25/2015   PSA 0.7 05/29/2014   PSA 0.81 03/19/2014         Relevant Orders   PSA, Medicare (Completed)   Osteoporosis    Secondary to use of Lupron for prostate CA.  Managed with Prolia . Repeat DEXA in 2017      Relevant Medications   Cholecalciferol (VITAMIN D3) 400 UNITS CAPS   Underweight     I have reviewed hsr diet and recommended  that he increase his protein intake while monitoring his complex carbohydrates and refined sugars.       Loss of weight - Primary   Relevant Orders   CBC with Differential/Platelet (Completed)   Comprehensive metabolic panel (Completed)   TSH (Completed)    Other Visit Diagnoses    Hyperlipidemia LDL goal <130        Relevant Orders    Lipid panel (Completed)    Vitamin D deficiency        Relevant Orders    Vit D  25 hydroxy (rtn osteoporosis monitoring) (Completed)       I am having Mr. Monnig maintain his FISH OIL MAXIMUM STRENGTH, GLUCOSAMINE CHONDR 1500 COMPLX, PRESERVISION AREDS 2,  loratadine, Calcium Carb-Cholecalciferol (CALCIUM 1000 + D PO), omeprazole, and Vitamin D3.  Meds ordered this encounter  Medications  . Cholecalciferol (VITAMIN D3) 400 UNITS CAPS    Sig: Take 1 capsule by mouth daily.    There are no discontinued medications.  Follow-up: No Follow-up on file.   Crecencio Mc, MD

## 2015-03-27 NOTE — Assessment & Plan Note (Signed)
Untreated due to history of statin intolerance. Patient has no history of heart disease or hypertension and is physically active. Continue mediterranean style diet.   Lab Results  Component Value Date   CHOL 203* 03/25/2015   HDL 40.40 03/25/2015   LDLCALC 144* 09/09/2013   LDLDIRECT 129.0 03/25/2015   TRIG 202.0* 03/25/2015   CHOLHDL 5 03/25/2015

## 2015-03-27 NOTE — Assessment & Plan Note (Signed)
Secondary to use of Lupron for prostate CA.  Managed with Prolia . Repeat DEXA in 2017

## 2015-03-27 NOTE — Assessment & Plan Note (Signed)

## 2015-03-29 ENCOUNTER — Encounter: Payer: Self-pay | Admitting: Internal Medicine

## 2015-05-14 ENCOUNTER — Other Ambulatory Visit: Payer: Self-pay | Admitting: Internal Medicine

## 2015-05-21 DIAGNOSIS — H353212 Exudative age-related macular degeneration, right eye, with inactive choroidal neovascularization: Secondary | ICD-10-CM | POA: Diagnosis not present

## 2015-05-28 DIAGNOSIS — H353212 Exudative age-related macular degeneration, right eye, with inactive choroidal neovascularization: Secondary | ICD-10-CM | POA: Diagnosis not present

## 2015-07-03 ENCOUNTER — Other Ambulatory Visit: Payer: Self-pay | Admitting: Internal Medicine

## 2015-07-03 ENCOUNTER — Telehealth: Payer: Self-pay

## 2015-07-03 DIAGNOSIS — M810 Age-related osteoporosis without current pathological fracture: Secondary | ICD-10-CM

## 2015-07-03 NOTE — Telephone Encounter (Signed)
It does not have to be done prior to her Prolia injection, , but I have ordered it.

## 2015-07-03 NOTE — Telephone Encounter (Signed)
Pt wants to know if he needs a bone density scan before coming in for next Prolia shot in February. Previous notes state repeat DEXA repeated in 2017. Please advise/tvw

## 2015-07-06 NOTE — Telephone Encounter (Signed)
Rose, Can you check his status with a insurance change for Prolia.  Thanks

## 2015-07-06 NOTE — Telephone Encounter (Signed)
Spoke with the patient, his insurance is [place] Id# is [id number].   Please let me know if there is anything else you need.

## 2015-07-06 NOTE — Telephone Encounter (Signed)
I will need the pt's [employer] subscriber ID #.  Could you please let me know what it is so that I can run an insurance verification?  Thank you!

## 2015-07-06 NOTE — Telephone Encounter (Signed)
I have electronically submitted pt's info for Prolia insurance verification and will notify you once I have a response. Thank you. °

## 2015-07-06 NOTE — Telephone Encounter (Signed)
Called and notified pt of Dr. Demetrios Isaacs message. Pt states that his insurance changed and now has [employer] and he needs a pre authorization for the medicine./tvw

## 2015-07-16 ENCOUNTER — Encounter: Payer: Self-pay | Admitting: Internal Medicine

## 2015-07-23 NOTE — Telephone Encounter (Signed)
I have rec'd insurance verification for Prolia, however, [employer] is requiring a prior authorization.  I have completed the required form and faxed to [employer] along w/Dexa scan from 11/14/2013 and will let you know once I have a response.  Thank you.

## 2015-07-24 DIAGNOSIS — H353223 Exudative age-related macular degeneration, left eye, with inactive scar: Secondary | ICD-10-CM | POA: Diagnosis not present

## 2015-07-24 DIAGNOSIS — H43812 Vitreous degeneration, left eye: Secondary | ICD-10-CM | POA: Diagnosis not present

## 2015-07-24 DIAGNOSIS — H353211 Exudative age-related macular degeneration, right eye, with active choroidal neovascularization: Secondary | ICD-10-CM | POA: Diagnosis not present

## 2015-07-29 ENCOUNTER — Other Ambulatory Visit: Payer: PPO

## 2015-07-30 DIAGNOSIS — H01021 Squamous blepharitis right upper eyelid: Secondary | ICD-10-CM | POA: Diagnosis not present

## 2015-07-30 DIAGNOSIS — H16223 Keratoconjunctivitis sicca, not specified as Sjogren's, bilateral: Secondary | ICD-10-CM | POA: Diagnosis not present

## 2015-08-13 DIAGNOSIS — H01021 Squamous blepharitis right upper eyelid: Secondary | ICD-10-CM | POA: Diagnosis not present

## 2015-08-20 NOTE — Telephone Encounter (Signed)
Rose, can you update? Thanks

## 2015-08-20 NOTE — Telephone Encounter (Signed)
Pt called back to get a update on the prolia. I advised pt it was being worked on and that the nurse will be sending an update to [facility]. Thank you!

## 2015-08-24 ENCOUNTER — Telehealth: Payer: Self-pay

## 2015-08-24 NOTE — Telephone Encounter (Signed)
Patient called and spoke with the Triage CMA, she reviewed your note with insurance verification.  He will call [facility] to see if he qualifies for the assistance.  Thanks

## 2015-08-24 NOTE — Telephone Encounter (Signed)
Thanks [place]! I will document that on my screens.

## 2015-08-24 NOTE — Telephone Encounter (Signed)
Ordered Prolia in [facility].

## 2015-08-24 NOTE — Telephone Encounter (Signed)
FYI: There was a message left from a previous note about this pt's prolia shot, i advised the pt of what was written in the chart by Rose. Pt verbalized understanding and apprecitiation. Pt states that he is going to call [employer] to see if he cn get assistance before scheduling shot.

## 2015-08-24 NOTE — Telephone Encounter (Signed)
I called [employer] in ref to George Owens p/a for Prolia.  I spoke to [place] there and she says he is approved for ONE injection, eff 07/30/2015-01/28/2016, Josem Kaufmann [id number].  [name] estimated responsibility will $195 w/out an OV; $215 w/an OV.  Please make pt aware this is an estimate and we will not know an exact amt until insurance(s) has/have paid. I have sent a copy of the summary of benefits to be scanned into pt's chart.   If pt cannot afford [phone] for her injection, please advise her to contact [facility] at [phone] and select option #1 to see if she qualifies for one of their assistance programs.  If she qualifies they will instruct her how to proceed.  If you have any questions, please let me know.   Once pt recs injection, please let me know actual injection date so I can update the Prolia portal.  If you have any questions, please let me know.  Thank you.

## 2015-08-25 ENCOUNTER — Ambulatory Visit (INDEPENDENT_AMBULATORY_CARE_PROVIDER_SITE_OTHER): Payer: PPO | Admitting: Surgical

## 2015-08-25 DIAGNOSIS — M810 Age-related osteoporosis without current pathological fracture: Secondary | ICD-10-CM

## 2015-08-25 MED ORDER — DENOSUMAB 60 MG/ML ~~LOC~~ SOLN
60.0000 mg | Freq: Once | SUBCUTANEOUS | Status: AC
  Administered 2015-08-25: 60 mg via SUBCUTANEOUS

## 2015-08-26 NOTE — Telephone Encounter (Signed)
Received injection on 3/7 17 at 400pm. Thanks

## 2015-09-02 DIAGNOSIS — H01021 Squamous blepharitis right upper eyelid: Secondary | ICD-10-CM | POA: Diagnosis not present

## 2015-09-18 DIAGNOSIS — H353223 Exudative age-related macular degeneration, left eye, with inactive scar: Secondary | ICD-10-CM | POA: Diagnosis not present

## 2015-09-18 DIAGNOSIS — H43812 Vitreous degeneration, left eye: Secondary | ICD-10-CM | POA: Diagnosis not present

## 2015-09-18 DIAGNOSIS — H353211 Exudative age-related macular degeneration, right eye, with active choroidal neovascularization: Secondary | ICD-10-CM | POA: Diagnosis not present

## 2015-11-10 ENCOUNTER — Other Ambulatory Visit: Payer: Self-pay | Admitting: Internal Medicine

## 2015-11-17 ENCOUNTER — Ambulatory Visit
Admission: RE | Admit: 2015-11-17 | Discharge: 2015-11-17 | Disposition: A | Discharge status: Home / Self Care | Payer: PPO | Source: Ambulatory Visit | Attending: Internal Medicine | Admitting: Internal Medicine

## 2015-11-17 DIAGNOSIS — Z13820 Encounter for screening for osteoporosis: Secondary | ICD-10-CM | POA: Insufficient documentation

## 2015-11-17 DIAGNOSIS — M810 Age-related osteoporosis without current pathological fracture: Secondary | ICD-10-CM | POA: Diagnosis not present

## 2015-11-19 ENCOUNTER — Encounter: Payer: Self-pay | Admitting: Internal Medicine

## 2015-11-24 NOTE — Telephone Encounter (Signed)
George Owens is not due for Prolia until September, so I will send request next month, otherwise, he may be denied since it's too early.  Thank you!

## 2015-12-09 DIAGNOSIS — H353223 Exudative age-related macular degeneration, left eye, with inactive scar: Secondary | ICD-10-CM | POA: Diagnosis not present

## 2015-12-09 DIAGNOSIS — H353211 Exudative age-related macular degeneration, right eye, with active choroidal neovascularization: Secondary | ICD-10-CM | POA: Diagnosis not present

## 2015-12-09 DIAGNOSIS — H43812 Vitreous degeneration, left eye: Secondary | ICD-10-CM | POA: Diagnosis not present

## 2015-12-30 DIAGNOSIS — Z08 Encounter for follow-up examination after completed treatment for malignant neoplasm: Secondary | ICD-10-CM | POA: Diagnosis not present

## 2015-12-30 DIAGNOSIS — L821 Other seborrheic keratosis: Secondary | ICD-10-CM | POA: Diagnosis not present

## 2015-12-30 DIAGNOSIS — L820 Inflamed seborrheic keratosis: Secondary | ICD-10-CM | POA: Diagnosis not present

## 2015-12-30 DIAGNOSIS — Z85828 Personal history of other malignant neoplasm of skin: Secondary | ICD-10-CM | POA: Diagnosis not present

## 2016-01-25 ENCOUNTER — Telehealth: Payer: Self-pay | Admitting: Internal Medicine

## 2016-01-25 NOTE — Telephone Encounter (Signed)
Pt called and wanted to know if he could get his prolia injection. He thinks it is time for it. He also made an appt. 10/11 to have a physical with Dr. Derrel Nip. He wanted to know if he need to come in a few days before physical to have lab work done.

## 2016-01-25 NOTE — Telephone Encounter (Signed)
He is due in September for his injection.  I will be getting authorization this month.  Thanks

## 2016-02-04 DIAGNOSIS — H353211 Exudative age-related macular degeneration, right eye, with active choroidal neovascularization: Secondary | ICD-10-CM | POA: Diagnosis not present

## 2016-02-04 DIAGNOSIS — H353223 Exudative age-related macular degeneration, left eye, with inactive scar: Secondary | ICD-10-CM | POA: Diagnosis not present

## 2016-02-19 NOTE — Telephone Encounter (Signed)
Noted, thanks!

## 2016-02-19 NOTE — Telephone Encounter (Signed)
I have sent off the verification, I hopefully will have a answer on Tuesday, thanks

## 2016-02-19 NOTE — Telephone Encounter (Signed)
Pt called to check if his Prolia was approved. Please advise?  Call pt @ [phone]. Thank you!

## 2016-02-19 NOTE — Telephone Encounter (Signed)
Ok. I called pt and left a vm to call the office. °

## 2016-02-23 ENCOUNTER — Telehealth: Payer: Self-pay | Admitting: *Deleted

## 2016-02-23 NOTE — Telephone Encounter (Signed)
Spoke with patient and I have a call out to [facility] to assist, thanks

## 2016-02-23 NOTE — Telephone Encounter (Signed)
Patient has requested to have a call from triage to discuss his prolia vaccine, he stated he should have this vaccine on 09/08

## 2016-03-03 NOTE — Telephone Encounter (Signed)
Almyra Free [id number]  from [facility] called regarding patient's Prolia injection. Thank you!  AM

## 2016-03-03 NOTE — Telephone Encounter (Signed)
Spoke with Almyra Free, information was sent this am to [facility].

## 2016-03-21 ENCOUNTER — Ambulatory Visit (INDEPENDENT_AMBULATORY_CARE_PROVIDER_SITE_OTHER): Payer: PPO

## 2016-03-21 ENCOUNTER — Telehealth: Payer: Self-pay | Admitting: Internal Medicine

## 2016-03-21 DIAGNOSIS — Z79899 Other long term (current) drug therapy: Secondary | ICD-10-CM | POA: Diagnosis not present

## 2016-03-21 DIAGNOSIS — M810 Age-related osteoporosis without current pathological fracture: Secondary | ICD-10-CM

## 2016-03-21 MED ORDER — DENOSUMAB 60 MG/ML ~~LOC~~ SOLN
60.0000 mg | Freq: Once | SUBCUTANEOUS | Status: AC
  Administered 2016-03-21: 60 mg via SUBCUTANEOUS

## 2016-03-21 NOTE — Telephone Encounter (Signed)
Patient was called.  No answer.  Left a message to return a call to the office and schedule AWV anytime after 03/24/16 or I would call him again on Wednesday.

## 2016-03-21 NOTE — Telephone Encounter (Signed)
Patient came to office for Prolia shot, and requested to talk with me.  The past several shots have been late, and he wants future shots on time.  I advised we have a new system in place to eliminate the gaps, and Juliann Pulse will be Tanya's replacement for a few months.   He advised appreciation and understanding.  Denisa will call him today to set up his next Annual Wellness visit.  He has a physical next week, and wants to know which flu shot to request at this visit.  Please have Juliann Pulse call him with your recommendation.

## 2016-03-21 NOTE — Telephone Encounter (Signed)
Lett detailed message advising patient of High dose flu vaccine.

## 2016-03-21 NOTE — Progress Notes (Signed)
Patient came to the office for a Prolia injection.  Received in the Left arm subcutaneously, no complaints.  Tolerated well.  Requested to see the office manager and took him to Practice administrators office to discuss his concerns with our process, thanks

## 2016-03-22 ENCOUNTER — Telehealth: Payer: Self-pay | Admitting: Internal Medicine

## 2016-03-22 NOTE — Telephone Encounter (Signed)
Follow-up from discussion with patient 10/2.  Explained Prolia process, checked with Lavella Lemons and notes have been made 30 days out in Feb. For his next shot in March.  Denisa called to discuss his Annual Wellness visit, and he confirmed he already has an appointment, and agreed Juliann Pulse had already contacted him about the best type of flu shot for him to take per Dr. Lupita Dawn recommendations.  Patient was very satisfied with the follow up and staff actions in quick responses to his questions.  He will call me if any future needs.

## 2016-03-23 ENCOUNTER — Ambulatory Visit (INDEPENDENT_AMBULATORY_CARE_PROVIDER_SITE_OTHER): Payer: PPO

## 2016-03-23 VITALS — BP 120/68 | HR 71 | Temp 98.4°F | Resp 12 | Ht 76.0 in | Wt 194.0 lb

## 2016-03-23 DIAGNOSIS — Z0000 Encounter for general adult medical examination without abnormal findings: Secondary | ICD-10-CM | POA: Diagnosis not present

## 2016-03-23 NOTE — Progress Notes (Signed)
Subjective:   George Owens is a 82 y.o. male who presents for Medicare Annual/Subsequent preventive examination.  Review of Systems:  No ROS.  Medicare Wellness Visit.  Cardiac Risk Factors include: advanced age (>28men, >65 women);hypertension     Objective:    Vitals: BP 120/68 (BP Location: Left Arm, Patient Position: Sitting, Cuff Size: Normal)   Pulse 71   Temp 98.4 F (36.9 C) (Oral)   Resp 12   Ht 6\' 4"  (1.93 m)   Wt 194 lb (88 kg)   SpO2 96%   BMI 23.61 kg/m   Body mass index is 23.61 kg/m.  Tobacco History  Smoking Status  . Never Smoker  Smokeless Tobacco  . Never Used     Counseling given: Not Answered   Past Medical History:  Diagnosis Date  . Allergy   . Chicken pox   . Colon polyps   . history of prostate CA   . Hyperlipidemia   . Leg cramps   . Neuropathy [facility])    Past Surgical History:  Procedure Laterality Date  . EYE SURGERY    . TONSILECTOMY, ADENOIDECTOMY, BILATERAL MYRINGOTOMY AND TUBES     Family History  Problem Relation Age of Onset  . Stroke Mother   . Arthritis Father   . Heart disease Father    History  Sexual Activity  . Sexual activity: Yes  . Partners: Female    Outpatient Encounter Prescriptions as of 03/23/2016  Medication Sig  . Calcium Carb-Cholecalciferol (CALCIUM 1000 + D PO) Take 2 tablets by mouth daily.   . Cholecalciferol (VITAMIN D3) 400 UNITS CAPS Take 1 capsule by mouth daily.  . Glucosamine-Chondroit-Vit C-Mn (GLUCOSAMINE CHONDR 1500 COMPLX) CAPS Take 1 capsule by mouth daily.   [name] loratadine (CLARITIN) 10 MG tablet Take 10 mg by mouth daily as needed.   . Multiple Vitamins-Minerals (PRESERVISION AREDS 2) CAPS Take 2 capsules by mouth daily.  [name] omeprazole (PRILOSEC) 40 MG capsule TAKE ONE CAPSULE BY MOUTH EVERY DAY  . [DISCONTINUED] Omega-3 Fatty Acids (FISH OIL MAXIMUM STRENGTH) 1200 MG CAPS Take 1 capsule by mouth daily.    No facility-administered encounter medications on file as of 03/23/2016.       Activities of Daily Living In your present state of health, do you have any difficulty performing the following activities: 03/23/2016  Hearing? N  Vision? N  Difficulty concentrating or making decisions? N  Walking or climbing stairs? N  Dressing or bathing? N  Doing errands, shopping? N  Preparing Food and eating ? N  Using the Toilet? N  In the past six months, have you accidently leaked urine? N  Do you have problems with loss of bowel control? N  Managing your Medications? N  Managing your Finances? N  Housekeeping or managing your Housekeeping? N  Some recent data might be hidden    Patient Care Team: Crecencio Mc, MD as PCP - General (Internal Medicine)   Assessment:    This is a routine wellness examination for George Owens. The goal of the wellness visit is to assist the patient how to close the gaps in care and create a preventative care plan for the patient.   Taking calcium VIT D3 as appropriate/Osteoporosis reviewed.  Medications reviewed; taking without issues or barriers.  Safety issues reviewed; lives with wife.  Alarm system, smoke and carbon monoxide detectors in the home. Life alert necklace. Firearms locked in a safe within the home. Wears seatbelts when driving or riding with others.  No violence in the home.  No identified risk were noted; The patient was oriented x 3; appropriate in dress and manner and no objective failures at ADL's or IADL's.   Body mass index; discussed the importance of a healthy diet, water intake and exercise. Educational material provided.  Influenza vaccine deferred for administration at next weeks appointment, per patient request.   Patient Concerns: Pain on either the lower rib cage when reaching backwards.  R or L depending on which side he is reaching towards.  Denies SOB, chest pain/pressure, but he would like an EKG when returning next week for scheduled appointment.  Onset x1 year ago.  Deferred to PCP for follow  up.  Exercise Activities and Dietary recommendations Current Exercise Habits: Home exercise routine, Type of exercise: walking, Time (Minutes): 45, Frequency (Times/Week): 5, Weekly Exercise (Minutes/Week): 225, Intensity: Mild  Goals    . Healthy Lifestyle          Stay hydrated and drink plenty of fluids. Stay active and continue walking regimen. Healthy foods.       Fall Risk Fall Risk  03/23/2016 03/19/2014  Falls in the past year? No No   Depression Screen PHQ 2/9 Scores 03/23/2016 03/19/2014  PHQ - 2 Score 0 0    Cognitive Testing MMSE - Mini Mental State Exam 03/23/2016  Orientation to time 5  Orientation to Place 5  Registration 3  Attention/ Calculation 5  Recall 3  Language- name 2 objects 2  Language- repeat 1  Language- follow 3 step command 3  Language- read & follow direction 1  Write a sentence 1  Copy design 1  Total score 30    Immunization History  Administered Date(s) Administered  . Influenza Split 04/18/2013  . Pneumococcal Conjugate-13 10/04/2013  . Pneumococcal Polysaccharide-23 02/28/2012  . Tdap 04/30/2013  . Zoster 11/24/2010   Screening Tests Health Maintenance  Topic Date Due  . INFLUENZA VACCINE  01/19/2016  . TETANUS/TDAP  05/01/2023  . ZOSTAVAX  Completed  . PNA vac Low Risk Adult  Completed      Plan:    End of life planning; Advance aging; Advanced directives discussed. Copy of current HCPOA/Living Will requested.  Medicare Attestation I have personally reviewed: The patient's medical and social history Their use of alcohol, tobacco or illicit drugs Their current medications and supplements The patient's functional ability including ADLs,fall risks, home safety risks, cognitive, and hearing and visual impairment Diet and physical activities Evidence for depression   The patient's weight, height, BMI, and visual acuity have been recorded in the chart.  I have made referrals and provided education to the patient based on  review of the above and I have provided the patient with a written personalized care plan for preventive services.    During the course of the visit the patient was educated and counseled about the following appropriate screening and preventive services:   Vaccines to include Pneumoccal, Influenza, Hepatitis B, Td, Zostavax, HCV  Electrocardiogram  Cardiovascular Disease  Colorectal cancer screening  Diabetes screening  Prostate Cancer Screening  Glaucoma screening  Nutrition counseling   Smoking cessation counseling  Patient Instructions (the written plan) was given to the patient.    Varney Biles, LPN  [id number]

## 2016-03-23 NOTE — Patient Instructions (Addendum)
  [name] , Thank you for taking time to come for your Medicare Wellness Visit. I appreciate your ongoing commitment to your health goals. Please review the following plan we discussed and let me know if I can assist you in the future.   These are the goals we discussed: Goals    . Healthy Lifestyle          Stay hydrated and drink plenty of fluids. Stay active and continue walking regimen. Healthy foods.        This is a list of the screening recommended for you and due dates:  Health Maintenance  Topic Date Due  . Flu Shot  01/19/2016  . Tetanus Vaccine  05/01/2023  . Shingles Vaccine  Completed  . Pneumonia vaccines  Completed

## 2016-03-23 NOTE — Progress Notes (Signed)
  I have reviewed the above information and agree with above.   Ashonti Leandro, MD 

## 2016-03-30 ENCOUNTER — Encounter: Payer: Self-pay | Admitting: Internal Medicine

## 2016-03-30 ENCOUNTER — Ambulatory Visit (INDEPENDENT_AMBULATORY_CARE_PROVIDER_SITE_OTHER): Payer: PPO | Admitting: Internal Medicine

## 2016-03-30 VITALS — BP 118/76 | HR 71 | Temp 97.8°F | Resp 12 | Ht 76.0 in | Wt 192.5 lb

## 2016-03-30 DIAGNOSIS — Z0000 Encounter for general adult medical examination without abnormal findings: Secondary | ICD-10-CM

## 2016-03-30 DIAGNOSIS — E7800 Pure hypercholesterolemia, unspecified: Secondary | ICD-10-CM

## 2016-03-30 DIAGNOSIS — R634 Abnormal weight loss: Secondary | ICD-10-CM | POA: Diagnosis not present

## 2016-03-30 DIAGNOSIS — H6120 Impacted cerumen, unspecified ear: Secondary | ICD-10-CM | POA: Insufficient documentation

## 2016-03-30 DIAGNOSIS — Z23 Encounter for immunization: Secondary | ICD-10-CM | POA: Diagnosis not present

## 2016-03-30 DIAGNOSIS — R0789 Other chest pain: Secondary | ICD-10-CM

## 2016-03-30 DIAGNOSIS — M48061 Spinal stenosis, lumbar region without neurogenic claudication: Secondary | ICD-10-CM

## 2016-03-30 DIAGNOSIS — E559 Vitamin D deficiency, unspecified: Secondary | ICD-10-CM

## 2016-03-30 DIAGNOSIS — Z8546 Personal history of malignant neoplasm of prostate: Secondary | ICD-10-CM | POA: Diagnosis not present

## 2016-03-30 DIAGNOSIS — H6123 Impacted cerumen, bilateral: Secondary | ICD-10-CM

## 2016-03-30 DIAGNOSIS — M818 Other osteoporosis without current pathological fracture: Secondary | ICD-10-CM

## 2016-03-30 LAB — COMPREHENSIVE METABOLIC PANEL
ALT: 21 U/L (ref 0–53)
AST: 22 U/L (ref 0–37)
Albumin: 4.1 g/dL (ref 3.5–5.2)
Alkaline Phosphatase: 48 U/L (ref 39–117)
BUN: 23 mg/dL (ref 6–23)
CO2: 32 mEq/L (ref 19–32)
Calcium: 8.7 mg/dL (ref 8.4–10.5)
Chloride: 102 mEq/L (ref 96–112)
Creatinine, Ser: 0.91 mg/dL (ref 0.40–1.50)
GFR: 84.8 mL/min (ref >60.00)
Glucose, Bld: 87 mg/dL (ref 70–99)
Potassium: 3.9 mEq/L (ref 3.5–5.1)
Sodium: 142 mEq/L (ref 135–145)
Total Bilirubin: 0.7 mg/dL (ref 0.2–1.2)
Total Protein: 6.5 g/dL (ref 6.0–8.3)

## 2016-03-30 LAB — LIPID PANEL
Cholesterol: 191 mg/dL (ref 0–200)
HDL: 36.2 mg/dL — ABNORMAL LOW (ref >39.00)
LDL Cholesterol: 121 mg/dL — ABNORMAL HIGH (ref 0–99)
NonHDL: 154.9
Total CHOL/HDL Ratio: 5
Triglycerides: 169 mg/dL — ABNORMAL HIGH (ref 0.0–149.0)
VLDL: 33.8 mg/dL (ref 0.0–40.0)

## 2016-03-30 LAB — TSH: TSH: 3.1 u[IU]/mL (ref 0.35–4.50)

## 2016-03-30 LAB — PSA, MEDICARE: PSA: 0.72 ng/ml (ref 0.10–4.00)

## 2016-03-30 LAB — VITAMIN D 25 HYDROXY (VIT D DEFICIENCY, FRACTURES): VITD: 38.05 ng/mL (ref 30.00–100.00)

## 2016-03-30 NOTE — Progress Notes (Signed)
Patient ID: George Owens, male    DOB: July 21, 1931  Age: 82 y.o. MRN: [id number]  The patient is here for annual Medicare wellness examination and management of other chronic and acute problems.   LAST SEEN oCT 2016 DEXA done May  T scores improving slightly -3.2 to -3.0 femur , spine normal    (2015 priorr)  The risk factors are reflected in the social history.  The roster of all physicians providing medical care to patient - is listed in the Snapshot section of the chart.  Activities of daily living:  The patient is 100% independent in all ADLs: dressing, toileting, feeding as well as independent mobility  Home safety : The patient has smoke detectors in the home. They wear seatbelts.  There are no firearms at home. There is no violence in the home.   There is no risks for hepatitis, STDs or HIV. There is no   history of blood transfusion. They have no travel history to infectious disease endemic areas of the world.  The patient has seen their dentist in the last six month. They have seen their eye doctor in the last year. They admit to slight hearing difficulty with regard to whispered voices and some television programs.  They have deferred audiologic testing in the last year.  They do not  have excessive sun exposure. Discussed the need for sun protection: hats, long sleeves and use of sunscreen if there is significant sun exposure.   Diet: the importance of a healthy diet is discussed. They do have a healthy diet.  The benefits of regular aerobic exercise were discussed. She walks 4 times per week ,  20 minutes.   Depression screen: there are no signs or vegative symptoms of depression- irritability, change in appetite, anhedonia, sadness/tearfullness.  Cognitive assessment: the patient manages all their financial and personal affairs and is actively engaged. They could relate day,date,year and events; recalled 2/3 objects at 3 minutes; performed clock-face test normally.  The  following portions of the patient's history were reviewed and updated as appropriate: allergies, current medications, past family history, past medical history,  past surgical history, past social history  and problem list.  Visual acuity was not assessed per patient preference since she has regular follow up with her ophthalmologist. Hearing and body mass index were assessed and reviewed.   During the course of the visit the patient was educated and counseled about appropriate screening and preventive services including : fall prevention , diabetes screening, nutrition counseling, colorectal cancer screening, and recommended immunizations.    CC: The primary encounter diagnosis was Pure hypercholesterolemia. Diagnoses of Bilateral impacted cerumen, History of prostate cancer, Vitamin D deficiency, Loss of weight, Other chest pain, Encounter for immunization, Other osteoporosis without current pathological fracture, Medicare annual wellness visit, subsequent, and Spinal stenosis of lumbar region without neurogenic claudication were also pertinent to this visit.  Bilateral  cerumen accumulation ,  No change in heraring  Some bilateral chest wall pain with sudden twisting or raising of arms.    worried about heart attacks and strokes,  Parents had these in their early 30's.  No falls  Has been having episodes of pain with twisting and turning . Reaching up  Sharp pain  History Aziz has a past medical history of Allergy; Chicken pox; Colon polyps; history of prostate CA; Hyperlipidemia; Leg cramps; and Neuropathy ([place]).   He has a past surgical history that includes Tonsilectomy, adenoidectomy, bilateral myringotomy and tubes and Eye surgery.   His  family history includes Arthritis in his father; Heart disease in his father; Stroke in his mother.He reports that he has never smoked. He has never used smokeless tobacco. He reports that he does not drink alcohol or use drugs.  Outpatient Medications  Prior to Visit  Medication Sig Dispense Refill  . Calcium Carb-Cholecalciferol (CALCIUM 1000 + D PO) Take 2 tablets by mouth daily.     . Cholecalciferol (VITAMIN D3) 400 UNITS CAPS Take 1 capsule by mouth daily.    . Glucosamine-Chondroit-Vit C-Mn (GLUCOSAMINE CHONDR 1500 COMPLX) CAPS Take 1 capsule by mouth daily.     [name] loratadine (CLARITIN) 10 MG tablet Take 10 mg by mouth daily as needed.     . Multiple Vitamins-Minerals (PRESERVISION AREDS 2) CAPS Take 2 capsules by mouth daily.    [name] omeprazole (PRILOSEC) 40 MG capsule TAKE ONE CAPSULE BY MOUTH EVERY DAY 90 capsule 1   No facility-administered medications prior to visit.     Review of Systems   Patient denies headache, fevers, malaise, unintentional weight loss, skin rash, eye pain, sinus congestion and sinus pain, sore throat, dysphagia,  hemoptysis , cough, dyspnea, wheezing, chest pain, palpitations, orthopnea, edema, abdominal pain, nausea, melena, diarrhea, constipation, flank pain, dysuria, hematuria, urinary  Frequency, nocturia, numbness, tingling, seizures,  Focal weakness, Loss of consciousness,  Tremor, insomnia, depression, anxiety, and suicidal ideation.      Objective:  BP 118/76   Pulse 71   Temp 97.8 F (36.6 C) (Oral)   Resp 12   Ht 6\' 4"  (1.93 m)   Wt 192 lb 8 oz (87.3 kg)   SpO2 95%   BMI 23.43 kg/m   Physical Exam   General appearance: alert, cooperative and appears stated age Ears:  external ear canals occluded by cerumen  Throat: lips, mucosa, and tongue normal; teeth and gums normal Neck: no adenopathy, no carotid bruit, supple, symmetrical, trachea midline and thyroid not enlarged, symmetric, no tenderness/mass/nodules Back: symmetric, no curvature. ROM normal. No CVA tenderness. Lungs: clear to auscultation bilaterally Heart: regular rate and rhythm, S1, S2 normal, no murmur, click, rub or gallop Abdomen: soft, non-tender; bowel sounds normal; no masses,  no organomegaly Pulses: 2+ and  symmetric Skin: Skin color, texture, turgor normal. No rashes or lesions Lymph nodes: Cervical, supraclavicular, and axillary nodes normal.    Assessment & Plan:   Problem List Items Addressed This Visit    RESOLVED: Loss of weight   Relevant Orders   Comprehensive metabolic panel (Completed)   TSH (Completed)   Hyperlipidemia - Primary    Untreated due to history of statin intolerance. Patient has no history of heart disease or hypertension and is physically active. Continue mediterranean style diet and exercise to raise HDL .   Lab Results  Component Value Date   CHOL 191 03/30/2016   HDL 36.20 (L) 03/30/2016   LDLCALC 121 (H) 03/30/2016   LDLDIRECT 129.0 03/25/2015   TRIG 169.0 (H) 03/30/2016   CHOLHDL 5 03/30/2016         Relevant Orders   Lipid panel (Completed)   Medicare annual wellness visit, subsequent    Annual Medicare wellness  exam was done as well as a comprehensive physical exam and management of acute and chronic conditions .  During the course of the visit the patient was educated and counseled about appropriate screening and preventive services including : fall prevention , diabetes screening, nutrition counseling, colorectal cancer screening, and recommended immunizations.  Printed recommendations for health maintenance screenings was given.  Osteoporosis    Secondary to use of Lupron for prostate CA.  Managed with Prolia . Repeat DEXA in 2017 showed slight improvement in scores       Spinal stenosis of lumbar region    Patient was given several exercises to do at home to increase his core strength and improve his bal      Vitamin D deficiency   Relevant Orders   VITAMIN D 25 Hydroxy (Vit-D Deficiency, Fractures) (Completed)   Cerumen impaction    Without hearing loss. Advised to use Debrox for one week and   Return for RN visit /irrigation if no improvement in one week      History of prostate cancer   Relevant Orders   PSA, Medicare  (Completed)    Other Visit Diagnoses    Other chest pain       Relevant Orders   EKG 12-Lead (Completed)   Encounter for immunization       Relevant Orders   Flu vaccine HIGH DOSE PF (Completed)      I am having Mr. Maxim maintain his GLUCOSAMINE CHONDR 1500 COMPLX, PRESERVISION AREDS 2, loratadine, Calcium Carb-Cholecalciferol (CALCIUM 1000 + D PO), Vitamin D3, and omeprazole.  No orders of the defined types were placed in this encounter.   There are no discontinued medications.  Follow-up: Return in about 1 year (around 03/30/2017), or rn visit for ear irrigation .   Crecencio Mc, MD

## 2016-03-30 NOTE — Progress Notes (Signed)
Pre-visit discussion using our clinic review tool. No additional management support is needed unless otherwise documented below in the visit note.  

## 2016-03-30 NOTE — Assessment & Plan Note (Addendum)
Without hearing loss. Advised to use Debrox for one week and   Return for RN visit /irrigation if no improvement in one week

## 2016-03-30 NOTE — Patient Instructions (Addendum)
You are starting to develop "the hump" in your upper back from poor posture  Please do the exercises I demonstrated to you today DAILY:  Roll your shoulder frontwards and backwards . 10 times ,  3 sets Add 5 lb wight to each hand when it gets easy  Practice bending from side to side at the waist using a pole across your shoulders, or 5 lb weights in each hand  10 each side , 3 sets  Twist  At the waist from side to side using the pole or the weights  10 times each side, 3 sets  Practice getting up from a chiar WITHOUT USING YOUR HANDS  10 times  3 sets  Stand on one leg for 10 seconds, near the kitchen counter to work on your balance.  Try to use only one finger on the counter 5 times each leg   3 sets   Use Debrox 3 drops in each ear at night (one ear per night) and place cotton ball in ear till morning Return for irrigation in one week   Health Maintenance, Male A healthy lifestyle and preventative care can promote health and wellness.  Maintain regular health, dental, and eye exams.  Eat a healthy diet. Foods like vegetables, fruits, whole grains, low-fat dairy products, and lean protein foods contain the nutrients you need and are low in calories. Decrease your intake of foods high in solid fats, added sugars, and salt. Get information about a proper diet from your health care provider, if necessary.  Regular physical exercise is one of the most important things you can do for your health. Most adults should get at least 150 minutes of moderate-intensity exercise (any activity that increases your heart rate and causes you to sweat) each week. In addition, most adults need muscle-strengthening exercises on 2 or more days a week.   Maintain a healthy weight. The body mass index (BMI) is a screening tool to identify possible weight problems. It provides an estimate of body fat based on height and weight. Your health care provider can find your BMI and can help you achieve or maintain a  healthy weight. For males 20 years and older:  A BMI below 18.5 is considered underweight.  A BMI of 18.5 to 24.9 is normal.  A BMI of 25 to 29.9 is considered overweight.  A BMI of 30 and above is considered obese.  Maintain normal blood lipids and cholesterol by exercising and minimizing your intake of saturated fat. Eat a balanced diet with plenty of fruits and vegetables. Blood tests for lipids and cholesterol should begin at age 72 and be repeated every 5 years. If your lipid or cholesterol levels are high, you are over age 74, or you are at high risk for heart disease, you may need your cholesterol levels checked more frequently.Ongoing high lipid and cholesterol levels should be treated with medicines if diet and exercise are not working.  If you smoke, find out from your health care provider how to quit. If you do not use tobacco, do not start.  Lung cancer screening is recommended for adults aged 36-80 years who are at high risk for developing lung cancer because of a history of smoking. A yearly low-dose CT scan of the lungs is recommended for people who have at least a 30-pack-year history of smoking and are current smokers or have quit within the past 15 years. A pack year of smoking is smoking an average of 1 pack of cigarettes a  day for 1 year (for example, a 30-pack-year history of smoking could mean smoking 1 pack a day for 30 years or 2 packs a day for 15 years). Yearly screening should continue until the smoker has stopped smoking for at least 15 years. Yearly screening should be stopped for people who develop a health problem that would prevent them from having lung cancer treatment.  If you choose to drink alcohol, do not have more than 2 drinks per day. One drink is considered to be 12 oz (360 mL) of beer, 5 oz (150 mL) of wine, or 1.5 oz (45 mL) of liquor.  Avoid the use of street drugs. Do not share needles with anyone. Ask for help if you need support or instructions about  stopping the use of drugs.  High blood pressure causes heart disease and increases the risk of stroke. High blood pressure is more likely to develop in:  People who have blood pressure in the end of the normal range (100-139/85-89 mm Hg).  People who are overweight or obese.  People who are African American.  If you are 62-49 years of age, have your blood pressure checked every 3-5 years. If you are 85 years of age or older, have your blood pressure checked every year. You should have your blood pressure measured twice--once when you are at a hospital or clinic, and once when you are not at a hospital or clinic. Record the average of the two measurements. To check your blood pressure when you are not at a hospital or clinic, you can use:  An automated blood pressure machine at a pharmacy.  A home blood pressure monitor.  If you are 65-57 years old, ask your health care provider if you should take aspirin to prevent heart disease.  Diabetes screening involves taking a blood sample to check your fasting blood sugar level. This should be done once every 3 years after age 39 if you are at a normal weight and without risk factors for diabetes. Testing should be considered at a younger age or be carried out more frequently if you are overweight and have at least 1 risk factor for diabetes.  Colorectal cancer can be detected and often prevented. Most routine colorectal cancer screening begins at the age of 28 and continues through age 79. However, your health care provider may recommend screening at an earlier age if you have risk factors for colon cancer. On a yearly basis, your health care provider may provide home test kits to check for hidden blood in the stool. A small camera at the end of a tube may be used to directly examine the colon (sigmoidoscopy or colonoscopy) to detect the earliest forms of colorectal cancer. Talk to your health care provider about this at age 71 when routine screening  begins. A direct exam of the colon should be repeated every 5-10 years through age 38, unless early forms of precancerous polyps or small growths are found.  People who are at an increased risk for hepatitis B should be screened for this virus. You are considered at high risk for hepatitis B if:  You were born in a country where hepatitis B occurs often. Talk with your health care provider about which countries are considered high risk.  Your parents were born in a high-risk country and you have not received a shot to protect against hepatitis B (hepatitis B vaccine).  You have HIV or AIDS.  You use needles to inject street drugs.  You live  with, or have sex with, someone who has hepatitis B.  You are a man who has sex with other men (MSM).  You get hemodialysis treatment.  You take certain medicines for conditions like cancer, organ transplantation, and autoimmune conditions.  Hepatitis C blood testing is recommended for all people born from [birth year] through 1963 and any individual with known risk factors for hepatitis C.  Healthy men should no longer receive prostate-specific antigen (PSA) blood tests as part of routine cancer screening. Talk to your health care provider about prostate cancer screening.  Testicular cancer screening is not recommended for adolescents or adult males who have no symptoms. Screening includes self-exam, a health care provider exam, and other screening tests. Consult with your health care provider about any symptoms you have or any concerns you have about testicular cancer.  Practice safe sex. Use condoms and avoid high-risk sexual practices to reduce the spread of sexually transmitted infections (STIs).  You should be screened for STIs, including gonorrhea and chlamydia if:  You are sexually active and are younger than 24 years.  You are older than 24 years, and your health care provider tells you that you are at risk for this type of infection.  Your  sexual activity has changed since you were last screened, and you are at an increased risk for chlamydia or gonorrhea. Ask your health care provider if you are at risk.  If you are at risk of being infected with HIV, it is recommended that you take a prescription medicine daily to prevent HIV infection. This is called pre-exposure prophylaxis (PrEP). You are considered at risk if:  You are a man who has sex with other men (MSM).  You are a heterosexual man who is sexually active with multiple partners.  You take drugs by injection.  You are sexually active with a partner who has HIV.  Talk with your health care provider about whether you are at high risk of being infected with HIV. If you choose to begin PrEP, you should first be tested for HIV. You should then be tested every 3 months for as long as you are taking PrEP.  Use sunscreen. Apply sunscreen liberally and repeatedly throughout the day. You should seek shade when your shadow is shorter than you. Protect yourself by wearing long sleeves, pants, a wide-brimmed hat, and sunglasses year round whenever you are outdoors.  Tell your health care provider of new moles or changes in moles, especially if there is a change in shape or color. Also, tell your health care provider if a mole is larger than the size of a pencil eraser.  A one-time screening for abdominal aortic aneurysm (AAA) and surgical repair of large AAAs by ultrasound is recommended for men aged 46-75 years who are current or former smokers.  Stay current with your vaccines (immunizations).   This information is not intended to replace advice given to you by your health care provider. Make sure you discuss any questions you have with your health care provider.   Document Released: 12/03/2007 Document Revised: 06/27/2014 Document Reviewed: 11/01/2010 Elsevier Interactive Patient Education [employer].

## 2016-03-31 NOTE — Assessment & Plan Note (Signed)
Patient was given several exercises to do at home to increase his core strength and improve his bal

## 2016-03-31 NOTE — Assessment & Plan Note (Signed)
Untreated due to history of statin intolerance. Patient has no history of heart disease or hypertension and is physically active. Continue mediterranean style diet and exercise to raise HDL .   Lab Results  Component Value Date   CHOL 191 03/30/2016   HDL 36.20 (L) 03/30/2016   LDLCALC 121 (H) 03/30/2016   LDLDIRECT 129.0 03/25/2015   TRIG 169.0 (H) 03/30/2016   CHOLHDL 5 03/30/2016

## 2016-03-31 NOTE — Assessment & Plan Note (Signed)
Secondary to use of Lupron for prostate CA.  Managed with Prolia . Repeat DEXA in 2017 showed slight improvement in scores

## 2016-03-31 NOTE — Assessment & Plan Note (Signed)

## 2016-04-13 DIAGNOSIS — H353212 Exudative age-related macular degeneration, right eye, with inactive choroidal neovascularization: Secondary | ICD-10-CM | POA: Diagnosis not present

## 2016-04-13 DIAGNOSIS — H35431 Paving stone degeneration of retina, right eye: Secondary | ICD-10-CM | POA: Diagnosis not present

## 2016-04-13 DIAGNOSIS — H43812 Vitreous degeneration, left eye: Secondary | ICD-10-CM | POA: Diagnosis not present

## 2016-04-13 DIAGNOSIS — H353223 Exudative age-related macular degeneration, left eye, with inactive scar: Secondary | ICD-10-CM | POA: Diagnosis not present

## 2016-05-16 ENCOUNTER — Other Ambulatory Visit: Payer: Self-pay | Admitting: Internal Medicine

## 2016-06-08 DIAGNOSIS — H353212 Exudative age-related macular degeneration, right eye, with inactive choroidal neovascularization: Secondary | ICD-10-CM | POA: Diagnosis not present

## 2016-06-09 DIAGNOSIS — D485 Neoplasm of uncertain behavior of skin: Secondary | ICD-10-CM | POA: Diagnosis not present

## 2016-06-09 DIAGNOSIS — L820 Inflamed seborrheic keratosis: Secondary | ICD-10-CM | POA: Diagnosis not present

## 2016-06-21 DIAGNOSIS — H353223 Exudative age-related macular degeneration, left eye, with inactive scar: Secondary | ICD-10-CM | POA: Diagnosis not present

## 2016-06-21 DIAGNOSIS — H35431 Paving stone degeneration of retina, right eye: Secondary | ICD-10-CM | POA: Diagnosis not present

## 2016-06-21 DIAGNOSIS — H43812 Vitreous degeneration, left eye: Secondary | ICD-10-CM | POA: Diagnosis not present

## 2016-06-21 DIAGNOSIS — H353212 Exudative age-related macular degeneration, right eye, with inactive choroidal neovascularization: Secondary | ICD-10-CM | POA: Diagnosis not present

## 2016-06-30 DIAGNOSIS — H353131 Nonexudative age-related macular degeneration, bilateral, early dry stage: Secondary | ICD-10-CM | POA: Diagnosis not present

## 2016-07-14 DIAGNOSIS — H35431 Paving stone degeneration of retina, right eye: Secondary | ICD-10-CM | POA: Diagnosis not present

## 2016-07-14 DIAGNOSIS — H353223 Exudative age-related macular degeneration, left eye, with inactive scar: Secondary | ICD-10-CM | POA: Diagnosis not present

## 2016-07-14 DIAGNOSIS — H353211 Exudative age-related macular degeneration, right eye, with active choroidal neovascularization: Secondary | ICD-10-CM | POA: Diagnosis not present

## 2016-07-14 DIAGNOSIS — H43812 Vitreous degeneration, left eye: Secondary | ICD-10-CM | POA: Diagnosis not present

## 2016-08-11 DIAGNOSIS — H353223 Exudative age-related macular degeneration, left eye, with inactive scar: Secondary | ICD-10-CM | POA: Diagnosis not present

## 2016-08-11 DIAGNOSIS — H35431 Paving stone degeneration of retina, right eye: Secondary | ICD-10-CM | POA: Diagnosis not present

## 2016-08-11 DIAGNOSIS — H43812 Vitreous degeneration, left eye: Secondary | ICD-10-CM | POA: Diagnosis not present

## 2016-08-11 DIAGNOSIS — H353211 Exudative age-related macular degeneration, right eye, with active choroidal neovascularization: Secondary | ICD-10-CM | POA: Diagnosis not present

## 2016-08-22 ENCOUNTER — Telehealth: Payer: Self-pay | Admitting: *Deleted

## 2016-08-22 NOTE — Telephone Encounter (Signed)
Pt requested to have his Prolia vaccine ordered.  Pt contact [phone]

## 2016-08-24 NOTE — Telephone Encounter (Signed)
Completed online Prolia veriification, spoke with the patient and he verbalized understanding I will call him once I have approval. thanks

## 2016-08-24 NOTE — Telephone Encounter (Signed)
Pt requested a call in reference to having his Prolia Vaccine  Pt contact [phone]

## 2016-09-09 ENCOUNTER — Telehealth: Payer: Self-pay | Admitting: Internal Medicine

## 2016-09-09 DIAGNOSIS — H43812 Vitreous degeneration, left eye: Secondary | ICD-10-CM | POA: Diagnosis not present

## 2016-09-09 DIAGNOSIS — H35431 Paving stone degeneration of retina, right eye: Secondary | ICD-10-CM | POA: Diagnosis not present

## 2016-09-09 DIAGNOSIS — H353211 Exudative age-related macular degeneration, right eye, with active choroidal neovascularization: Secondary | ICD-10-CM | POA: Diagnosis not present

## 2016-09-09 DIAGNOSIS — H353223 Exudative age-related macular degeneration, left eye, with inactive scar: Secondary | ICD-10-CM | POA: Diagnosis not present

## 2016-09-09 NOTE — Telephone Encounter (Signed)
See additional note. 

## 2016-09-09 NOTE — Telephone Encounter (Signed)
Pt called looking for a status update on his Prolia injection. Pt would like to be called this afternoon as he will be out of town next week. Please advise, thank you!  Call pt @ [phone]

## 2016-09-09 NOTE — Telephone Encounter (Signed)
Spoke with patient and scheduled visit.

## 2016-09-20 ENCOUNTER — Ambulatory Visit (INDEPENDENT_AMBULATORY_CARE_PROVIDER_SITE_OTHER): Payer: PPO | Admitting: *Deleted

## 2016-09-20 DIAGNOSIS — M818 Other osteoporosis without current pathological fracture: Secondary | ICD-10-CM

## 2016-09-20 MED ORDER — DENOSUMAB 60 MG/ML ~~LOC~~ SOLN
60.0000 mg | Freq: Once | SUBCUTANEOUS | Status: AC
  Administered 2016-09-20: 60 mg via SUBCUTANEOUS

## 2016-09-20 NOTE — Progress Notes (Signed)
Patient presented for 6 month Prolia injection to right arm  Sub Q , patient voiced no distress during injectio or showed any signs of distress.

## 2016-09-20 NOTE — Progress Notes (Signed)
  I have reviewed the above information and agree with above.   Marolyn Urschel, MD 

## 2016-10-07 DIAGNOSIS — H43812 Vitreous degeneration, left eye: Secondary | ICD-10-CM | POA: Diagnosis not present

## 2016-10-07 DIAGNOSIS — H353223 Exudative age-related macular degeneration, left eye, with inactive scar: Secondary | ICD-10-CM | POA: Diagnosis not present

## 2016-10-07 DIAGNOSIS — H35431 Paving stone degeneration of retina, right eye: Secondary | ICD-10-CM | POA: Diagnosis not present

## 2016-10-07 DIAGNOSIS — H353211 Exudative age-related macular degeneration, right eye, with active choroidal neovascularization: Secondary | ICD-10-CM | POA: Diagnosis not present

## 2016-11-01 ENCOUNTER — Ambulatory Visit (INDEPENDENT_AMBULATORY_CARE_PROVIDER_SITE_OTHER): Payer: PPO

## 2016-11-01 ENCOUNTER — Ambulatory Visit (INDEPENDENT_AMBULATORY_CARE_PROVIDER_SITE_OTHER): Payer: PPO | Admitting: Family

## 2016-11-01 ENCOUNTER — Encounter: Payer: Self-pay | Admitting: Family

## 2016-11-01 VITALS — BP 118/68 | HR 70 | Temp 97.9°F | Ht 76.0 in | Wt 198.8 lb

## 2016-11-01 DIAGNOSIS — M48061 Spinal stenosis, lumbar region without neurogenic claudication: Secondary | ICD-10-CM

## 2016-11-01 DIAGNOSIS — M545 Low back pain: Secondary | ICD-10-CM | POA: Diagnosis not present

## 2016-11-01 DIAGNOSIS — M5136 Other intervertebral disc degeneration, lumbar region: Secondary | ICD-10-CM | POA: Diagnosis not present

## 2016-11-01 NOTE — Assessment & Plan Note (Addendum)
Reassured by benign exam and absence of back pain during OV. However in context of h/o osteoporosis and compression fracture, will pursue imaging. Will decide after results next steps. Advised conservative therapy while we await results.

## 2016-11-01 NOTE — Progress Notes (Signed)
Pre visit review using our clinic review tool, if applicable. No additional management support is needed unless otherwise documented below in the visit note. 

## 2016-11-01 NOTE — Patient Instructions (Signed)
Xrays today  Continue conservative therapy at home - heat , ice, naprosyn until we have results.

## 2016-11-01 NOTE — Progress Notes (Signed)
Subjective:    Patient ID: George Owens, male    DOB: Jan 29, 1932, 83 y.o.   MRN: [id number]  CC: THEORDORE CISNERO is a 83 y.o. male who presents today for an acute visit.    HPI: CC: right low back pain chronic, started one week ago, waxed and waned. Had 'eased off for a couple of days' and then recurred 3 days ago.   No injury known, no heavy lifting.  Walked over weekend for 2 miles  And also water walking without pain  Hurts when sits for several minutes and then gets up, 'feels frozen.' Better with walking. No numbness, tingling in legs.   Naprosyn, heat, ice with some relief.   On prolia  No  Fever, dysuria, N, V, abdominal pain.   H/o left side sciatic pain     MRI 04/2014  No acute fractures, left foraminal disc protusion L1-L2.  HISTORY:  Past Medical History:  Diagnosis Date  . Allergy   . Chicken pox   . Colon polyps   . history of prostate CA   . Hyperlipidemia   . Leg cramps   . Neuropathy    Past Surgical History:  Procedure Laterality Date  . EYE SURGERY    . TONSILECTOMY, ADENOIDECTOMY, BILATERAL MYRINGOTOMY AND TUBES     Family History  Problem Relation Age of Onset  . Stroke Mother   . Arthritis Father   . Heart disease Father     Allergies: Kenalog [triamcinolone acetonide] Current Outpatient Prescriptions on File Prior to Visit  Medication Sig Dispense Refill  . Calcium Carb-Cholecalciferol (CALCIUM 1000 + D PO) Take 2 tablets by mouth daily.     . Cholecalciferol (VITAMIN D3) 400 UNITS CAPS Take 1 capsule by mouth daily.    . Glucosamine-Chondroit-Vit C-Mn (GLUCOSAMINE CHONDR 1500 COMPLX) CAPS Take 1 capsule by mouth daily.     [name] loratadine (CLARITIN) 10 MG tablet Take 10 mg by mouth daily as needed.     . Multiple Vitamins-Minerals (PRESERVISION AREDS 2) CAPS Take 2 capsules by mouth daily.    [name] omeprazole (PRILOSEC) 40 MG capsule TAKE ONE CAPSULE BY MOUTH EVERY DAY 90 capsule 3   No current facility-administered medications on file  prior to visit.     Social History  Substance Use Topics  . Smoking status: Never Smoker  . Smokeless tobacco: Never Used  . Alcohol use No    Review of Systems  Constitutional: Negative for chills and fever.  Respiratory: Negative for cough.   Cardiovascular: Negative for chest pain and palpitations.  Gastrointestinal: Negative for nausea and vomiting.  Genitourinary: Negative for dysuria and flank pain.  Musculoskeletal: Positive for back pain. Negative for gait problem.  Neurological: Negative for numbness.      Objective:    BP 118/68   Pulse 70   Temp 97.9 F (36.6 C) (Oral)   Ht 6\' 4"  (1.93 m)   Wt 198 lb 12.8 oz (90.2 kg)   SpO2 94%   BMI 24.20 kg/m    Physical Exam  Constitutional: He appears well-developed and well-nourished.  Cardiovascular: Regular rhythm and normal heart sounds.   Pulmonary/Chest: Effort normal and breath sounds normal. No respiratory distress. He has no wheezes. He has no rales.  Musculoskeletal:       Lumbar back: He exhibits normal range of motion, no tenderness, no swelling, no pain and no spasm.       Back:  Full range of motion with flexion, extension, lateral side bends.  No pain, numbness, tingling elicited with single leg raise bilaterally. No rash.  Area of pain noted per patient as noted on diagram. No tenderness elicited with palpation.   Right Hip: No limp or waddling gait. Full ROM with flexion and hip rotation in flexion.  No pain of lateral hip with  (flexion-abduction-external rotation) test. No pain with deep palpation of greater trochanter.    Neurological: He is alert.  Skin: Skin is warm and dry.  Psychiatric: He has a normal mood and affect. His speech is normal and behavior is normal.  Vitals reviewed.      Assessment & Plan:   Problem List Items Addressed This Visit      Other   Spinal stenosis of lumbar region - Primary    Reassured by benign exam and absence of back pain during OV. However in context of  h/o osteoporosis and compression fracture, will pursue imaging. Will decide after results next steps. Advised conservative therapy while we await results.       Relevant Orders   DG Lumbar Spine Complete   DG HIPS BILAT WITH PELVIS 2V        I am having Mr. Gautreau maintain his GLUCOSAMINE CHONDR 1500 COMPLX, PRESERVISION AREDS 2, loratadine, Calcium Carb-Cholecalciferol (CALCIUM 1000 + D PO), Vitamin D3, and omeprazole.   No orders of the defined types were placed in this encounter.   Return precautions given.   Risks, benefits, and alternatives of the medications and treatment plan prescribed today were discussed, and patient expressed understanding.   Education regarding symptom management and diagnosis given to patient on AVS.  Continue to follow with Crecencio Mc, MD for routine health maintenance.   George Owens and I agreed with plan.   Mable Paris, FNP

## 2016-11-03 ENCOUNTER — Telehealth: Payer: Self-pay | Admitting: Family

## 2016-11-03 DIAGNOSIS — G8929 Other chronic pain: Secondary | ICD-10-CM

## 2016-11-03 DIAGNOSIS — M545 Low back pain, unspecified: Secondary | ICD-10-CM

## 2016-11-03 NOTE — Telephone Encounter (Signed)
Spoke at length with patient that results of lumbar spine, hip. Jointly decided this time he would not pursue MRI for right femur lesion. He would like to pursue physical therapy and interested to see if physical therapy offered at the [facility]. We discussed at length atherosclerosis seen on x-ray as well. Patient has a statin intolerance. We discussed alternative regimens including red rice yeast extract and also omega-3. I advised him to follow-up with PCP regarding starting any supplements or medication for cholesterol.

## 2016-11-07 NOTE — Telephone Encounter (Signed)
Pt called back looking for a status update on this referral. Please advise, thank you!  Call pt @ [phone]

## 2016-11-07 NOTE — Telephone Encounter (Signed)
Referral for PT at [facility] been placed?

## 2016-11-07 NOTE — Addendum Note (Signed)
Addended by: Burnard Hawthorne on: 11/07/2016 04:26 PM   Modules accepted: Orders

## 2016-11-07 NOTE — Telephone Encounter (Signed)
Let patient know order is placed; also confirmed that [facility] has onsite PT.

## 2016-11-11 DIAGNOSIS — G8929 Other chronic pain: Secondary | ICD-10-CM | POA: Diagnosis not present

## 2016-11-11 DIAGNOSIS — M545 Low back pain: Secondary | ICD-10-CM | POA: Diagnosis not present

## 2016-11-15 DIAGNOSIS — H35431 Paving stone degeneration of retina, right eye: Secondary | ICD-10-CM | POA: Diagnosis not present

## 2016-11-15 DIAGNOSIS — H353223 Exudative age-related macular degeneration, left eye, with inactive scar: Secondary | ICD-10-CM | POA: Diagnosis not present

## 2016-11-15 DIAGNOSIS — H43812 Vitreous degeneration, left eye: Secondary | ICD-10-CM | POA: Diagnosis not present

## 2016-11-15 DIAGNOSIS — H353211 Exudative age-related macular degeneration, right eye, with active choroidal neovascularization: Secondary | ICD-10-CM | POA: Diagnosis not present

## 2016-11-16 ENCOUNTER — Telehealth: Payer: Self-pay | Admitting: *Deleted

## 2016-11-16 DIAGNOSIS — K5900 Constipation, unspecified: Secondary | ICD-10-CM

## 2016-11-16 DIAGNOSIS — R5383 Other fatigue: Secondary | ICD-10-CM

## 2016-11-16 NOTE — Telephone Encounter (Signed)
Patient has not had BM since Monday afternoon, has used Bisacodyl laxative with Miralax with no relief has a lot gas. . What little stool he has had comes out like a string. Plus, patient said he felt up into the lower abdominal tract with finger and can feel no stool. Patient states this is highly unusual for him.  Scheduled patient for Friday with PCP while appt open please advise.

## 2016-11-16 NOTE — Telephone Encounter (Signed)
Please have him come to office tomorrow for abdominal filmsand labs  .  Keep Friday's appt

## 2016-11-16 NOTE — Telephone Encounter (Signed)
Pt also stated that he does not have any abdominal pain, no nausea, and still has a normal appetite.

## 2016-11-16 NOTE — Telephone Encounter (Signed)
Spoke with pt and he stated several weeks ago he started having some change in his bowel movements. He stated that he just wasn't having bowel movements like he had been. Pt stated that last Thursday and Friday he drank a capful of miralax and that just produced a very small loose stool. Then on Sunday he tried taking a laxative with the miralax and about 20 hours later he had what seemed to be a normal bowel movemen. Since then he stated that he has not had a bowel movement and has taken both the miralax and the laxative. Pt is worried that he has an intestinal blockage. Please advise.

## 2016-11-16 NOTE — Telephone Encounter (Signed)
Patient feels as if he's having an intestinal blockage, due to sever constipation. Patient has taken laxatives  with no result. He requested a call to discuss care Pt care [phone]

## 2016-11-17 ENCOUNTER — Other Ambulatory Visit (INDEPENDENT_AMBULATORY_CARE_PROVIDER_SITE_OTHER): Payer: PPO

## 2016-11-17 ENCOUNTER — Ambulatory Visit (INDEPENDENT_AMBULATORY_CARE_PROVIDER_SITE_OTHER): Payer: PPO

## 2016-11-17 DIAGNOSIS — K5900 Constipation, unspecified: Secondary | ICD-10-CM

## 2016-11-17 DIAGNOSIS — R5383 Other fatigue: Secondary | ICD-10-CM

## 2016-11-17 LAB — CBC WITH DIFFERENTIAL/PLATELET
Basophils Absolute: 0 10*3/uL (ref 0.0–0.1)
Basophils Relative: 0.4 % (ref 0.0–3.0)
Eosinophils Absolute: 0.1 10*3/uL (ref 0.0–0.7)
Eosinophils Relative: 1.5 % (ref 0.0–5.0)
HCT: 44 % (ref 39.0–52.0)
Hemoglobin: 14.7 g/dL (ref 13.0–17.0)
Lymphocytes Relative: 34.2 % (ref 12.0–46.0)
Lymphs Abs: 1.7 10*3/uL (ref 0.7–4.0)
MCHC: 33.4 g/dL (ref 30.0–36.0)
MCV: 90.7 fl (ref 78.0–100.0)
Monocytes Absolute: 0.5 10*3/uL (ref 0.1–1.0)
Monocytes Relative: 10.3 % (ref 3.0–12.0)
Neutro Abs: 2.7 10*3/uL (ref 1.4–7.7)
Neutrophils Relative %: 53.6 % (ref 43.0–77.0)
Platelets: 165 10*3/uL (ref 150.0–400.0)
RBC: 4.86 Mil/uL (ref 4.22–5.81)
RDW: 13.8 % (ref 11.5–15.5)
WBC: 5 10*3/uL (ref 4.0–10.5)

## 2016-11-17 LAB — COMPREHENSIVE METABOLIC PANEL
ALT: 42 U/L (ref 0–53)
AST: 38 U/L — ABNORMAL HIGH (ref 0–37)
Albumin: 4.5 g/dL (ref 3.5–5.2)
Alkaline Phosphatase: 37 U/L — ABNORMAL LOW (ref 39–117)
BUN: 26 mg/dL — ABNORMAL HIGH (ref 6–23)
CO2: 32 mEq/L (ref 19–32)
Calcium: 9.8 mg/dL (ref 8.4–10.5)
Chloride: 100 mEq/L (ref 96–112)
Creatinine, Ser: 1.06 mg/dL (ref 0.40–1.50)
GFR: 71 mL/min (ref >60.00)
Glucose, Bld: 113 mg/dL — ABNORMAL HIGH (ref 70–99)
Potassium: 4.7 mEq/L (ref 3.5–5.1)
Sodium: 137 mEq/L (ref 135–145)
Total Bilirubin: 0.8 mg/dL (ref 0.2–1.2)
Total Protein: 7.2 g/dL (ref 6.0–8.3)

## 2016-11-17 LAB — TSH: TSH: 3.45 u[IU]/mL (ref 0.35–4.50)

## 2016-11-17 LAB — MAGNESIUM: Magnesium: 2 mg/dL (ref 1.5–2.5)

## 2016-11-17 NOTE — Telephone Encounter (Signed)
Patient coming in this afternoon for labs and X-ray. FYI

## 2016-11-18 ENCOUNTER — Encounter: Payer: Self-pay | Admitting: Internal Medicine

## 2016-11-18 ENCOUNTER — Ambulatory Visit (INDEPENDENT_AMBULATORY_CARE_PROVIDER_SITE_OTHER): Payer: PPO | Admitting: Internal Medicine

## 2016-11-18 DIAGNOSIS — G8929 Other chronic pain: Secondary | ICD-10-CM

## 2016-11-18 DIAGNOSIS — M5442 Lumbago with sciatica, left side: Secondary | ICD-10-CM

## 2016-11-18 DIAGNOSIS — K5900 Constipation, unspecified: Secondary | ICD-10-CM

## 2016-11-18 DIAGNOSIS — M545 Low back pain: Secondary | ICD-10-CM | POA: Diagnosis not present

## 2016-11-18 MED ORDER — PEG 3350-KCL-NABCB-NACL-NASULF 236 G PO SOLR: 4000.0000 mL | Freq: Once | ORAL | 0 refills | Status: AC

## 2016-11-18 NOTE — Progress Notes (Signed)
Subjective:  Patient ID: George Owens, male    DOB: February 27, 1932  Age: 83 y.o. MRN: [id number]  CC: Diagnoses of Constipation, unspecified constipation type and Chronic bilateral low back pain with left-sided sciatica were pertinent to this visit.  HPI George Owens presents for evaluation  of new onset constipation  With reduced output despite increased "work" f   5 lbs in the last 2 weeks , but weight is overall stable compared to Octr 2017 .  States that his last "normal " BM was 8 days ago.  Stared taking bisaocodyl , added to Miralax with very little outpt,  strongly thin, Sunday night.  Repeated dose Monday afternoon with no output.    This morning had 2 BMs normal girth .  Maintains good hydration.  Does not take benadryl .  Taking naprosyn for pelvic pain.  Walking 2 miles daily no pain with walking    Last colonoscopy ws 2009 in [place]  history of osteoporosis with old L2 compression fracture,  Secondary to prostate CA treatment . Has Spinal stenosis at L4-5 by 2017 MRI.  Recent episode of excruciating pain on the right side , aggravated by sitting  .  X ryas were negative for new fractures,  Was sent for PT at [facility].  Hamstrings found to be very tight by PT and he was given stretching maneuvers which have helped tremendously . Denies saddle parestheSIASias   Outpatient Medications Prior to Visit  Medication Sig Dispense Refill  . Calcium Carb-Cholecalciferol (CALCIUM 1000 + D PO) Take 2 tablets by mouth daily.     . Cholecalciferol (VITAMIN D3) 400 UNITS CAPS Take 1 capsule by mouth daily.    . Glucosamine-Chondroit-Vit C-Mn (GLUCOSAMINE CHONDR 1500 COMPLX) CAPS Take 1 capsule by mouth daily.     [name] loratadine (CLARITIN) 10 MG tablet Take 10 mg by mouth daily as needed.     [name] omeprazole (PRILOSEC) 40 MG capsule TAKE ONE CAPSULE BY MOUTH EVERY DAY 90 capsule 3  . Multiple Vitamins-Minerals (PRESERVISION AREDS 2) CAPS Take 2 capsules by mouth daily.     No  facility-administered medications prior to visit.     Review of Systems;  Patient denies headache, fevers, malaise, unintentional weight loss, skin rash, eye pain, sinus congestion and sinus pain, sore throat, dysphagia,  hemoptysis , cough, dyspnea, wheezing, chest pain, palpitations, orthopnea, edema, abdominal pain, nausea, melena, diarrhea, constipation, flank pain, dysuria, hematuria, urinary  Frequency, nocturia, numbness, tingling, seizures,  Focal weakness, Loss of consciousness,  Tremor, insomnia, depression, anxiety, and suicidal ideation.      Objective:  BP 104/66 (BP Location: Left Arm, Patient Position: Sitting, Cuff Size: Normal)   Pulse 74   Temp 97.9 F (36.6 C) (Oral)   Resp 15   Ht 6\' 4"  (1.93 m)   Wt 193 lb 9.6 oz (87.8 kg)   SpO2 96%   BMI 23.57 kg/m   BP Readings from Last 3 Encounters:  11/18/16 104/66  11/01/16 118/68  03/30/16 118/76    Wt Readings from Last 3 Encounters:  11/18/16 193 lb 9.6 oz (87.8 kg)  11/01/16 198 lb 12.8 oz (90.2 kg)  03/30/16 192 lb 8 oz (87.3 kg)    General appearance: alert, cooperative and appears stated age Ears: normal TM's and external ear canals both ears Throat: lips, mucosa, and tongue normal; teeth and gums normal Neck: no adenopathy, no carotid bruit, supple, symmetrical, trachea midline and thyroid not enlarged, symmetric, no tenderness/mass/nodules Back: symmetric, no curvature. ROM normal.  No CVA tenderness. Lungs: clear to auscultation bilaterally Heart: regular rate and rhythm, S1, S2 normal, no murmur, click, rub or gallop Abdomen: soft, non-tender; bowel sounds normal; no masses,  no organomegaly Pulses: 2+ and symmetric Skin: Skin color, texture, turgor normal. No rashes or lesions Lymph nodes: Cervical, supraclavicular, and axillary nodes normal.  No results found for: HGBA1C  Lab Results  Component Value Date   CREATININE 1.06 11/17/2016   CREATININE 0.91 03/30/2016   CREATININE 0.93 03/25/2015     Lab Results  Component Value Date   WBC 5.0 11/17/2016   HGB 14.7 11/17/2016   HCT 44.0 11/17/2016   PLT 165.0 11/17/2016   GLUCOSE 113 (H) 11/17/2016   CHOL 191 03/30/2016   TRIG 169.0 (H) 03/30/2016   HDL 36.20 (L) 03/30/2016   LDLDIRECT 129.0 03/25/2015   LDLCALC 121 (H) 03/30/2016   ALT 42 11/17/2016   AST 38 (H) 11/17/2016   NA 137 11/17/2016   K 4.7 11/17/2016   CL 100 11/17/2016   CREATININE 1.06 11/17/2016   BUN 26 (H) 11/17/2016   CO2 32 11/17/2016   TSH 3.45 11/17/2016   PSA 0.72 03/30/2016    Dg Bone Density  Result Date: 11/17/2015 EXAM: DUAL X-RAY ABSORPTIOMETRY (DXA) FOR BONE MINERAL DENSITY IMPRESSION: Dear Dr. Derrel Nip, Your patient Harshan Kearley completed a BMD test on 11/17/2015 using the [place] (analysis version: 14.10) manufactured by [employer]. The following summarizes the results of our evaluation. PATIENT BIOGRAPHICAL: Name: Infant, Zink Patient ID: [id number] Birth Date: [birth year] Height: 65.0 in. Gender: Male Exam Date: 11/17/2015 Weight: 196.0 lbs. Indications: Advanced Age, Caucasian, Height Loss, History of Fracture (Adult), Prostate ca Fractures: Foot, Stress Fx Treatments: Calcium, Multi-Vitamin with calcium, omeprazole, Prolia, Vit D ASSESSMENT: The BMD measured at Femur Neck Left is 0.681 g/cm2 with a T-score of -3.0. The patient is consider osteoporotic according to [facility]) criteria. L-2 was excluded due to degenerative changes. Site Region Measured Measured WHO Young Adult BMD Date       Age      Classification T-score AP Spine L1-L4 (L2) 11/17/2015 81.5 Normal -0.3 1.189 g/cm2 AP Spine L1-L4 (L2) 11/14/2013 79.5 Normal -0.7 1.136 g/cm2 DualFemur Neck Left 11/17/2015 81.5 Osteoporosis -3.0 0.681 g/cm2 DualFemur Neck Left 11/14/2013 79.5 Osteoporosis -3.2 0.654 g/cm2 World Health Organization [facility]) criteria for post-menopausal, Caucasian Women: Normal:       T-score at or above -1 SD Osteopenia:   T-score  between -1 and -2.5 SD Osteoporosis: T-score at or below -2.5 SD RECOMMENDATIONS: [facility] recommends that FDA-approved medical therapies be considered in postmenopausal women and men age 81 or older with a: 1. Hip or vertebral (clinical or morphometric) fracture. 2. T-score of < -2.5 at the spine or hip. 3. Ten-year fracture probability by FRAX of 3% or greater for hip fracture or 20% or greater for major osteoporotic fracture. All treatment decisions require clinical judgment and consideration of individual patient factors, including patient preferences, co-morbidities, previous drug use, risk factors not captured in the FRAX model (e.g. falls, vitamin D deficiency, increased bone turnover, interval significant decline in bone density) and possible under - or over-estimation of fracture risk by FRAX. All patients should ensure an adequate intake of dietary calcium (1200 mg/d) and vitamin D (800 IU daily) unless contraindicated. FOLLOW-UP: People with diagnosed cases of osteoporosis or osteopenia should be regularly tested for bone mineral density. For patients eligible for Medicare, routine testing is allowed once every 2 years. The testing  frequency can be increased to one year for patients who have rapidly progressing disease, or for those who are receiving medical therapy to restore bone mass. I have reviewed this report, and agree with the above findings. [facility] Radiology Electronically Signed   By: David  Martinique M.D.   On: 11/17/2015 12:28    Assessment & Plan:   Problem List Items Addressed This Visit    Constipation    New onset.  With normal labs,  Normal abdominal film, normal rectal exam.  Recommend [place] oe cathartic (GoLytely) followed by use of miralax /stool softener.       Back pain    Improved with stretching of hamstrings initiated by PT       A total of 25 minutes of face to face time was spent with patient more than half of which was spent in counselling  and coordination of care   I have discontinued Mr. Weirauch PRESERVISION AREDS 2. I am also having him start on polyethylene glycol. Additionally, I am having him maintain his GLUCOSAMINE CHONDR 1500 COMPLX, loratadine, Calcium Carb-Cholecalciferol (CALCIUM 1000 + D PO), Vitamin D3, and omeprazole.  Meds ordered this encounter  Medications  . polyethylene glycol (GOLYTELY) 236 g solution    Sig: Take 4,000 mLs by mouth once.    Dispense:  4000 mL    Refill:  0    Medications Discontinued During This Encounter  Medication Reason  . Multiple Vitamins-Minerals (PRESERVISION AREDS 2) CAPS Patient has not taken in last 30 days    Follow-up: No Follow-up on file.   Crecencio Mc, MD

## 2016-11-18 NOTE — Patient Instructions (Signed)
Your x rays and exam do not suggest any type ob obstruction , impaction or   I recommend we get you "cleaned out" with a couple glasses of GoLytely (the colonoscopy prep )  If your bowel habits do not return to normal within 2 weeks, call for GI referral  You may want to try substituting an 8 ounce glass of almond milk for one of your calcium supplements.  They may be constipating you    Constipation, Adult Constipation is when a person has fewer bowel movements in a week than normal, has difficulty having a bowel movement, or has stools that are dry, hard, or larger than normal. Constipation may be caused by an underlying condition. It may become worse with age if a person takes certain medicines and does not take in enough fluids. Follow these instructions at home: Eating and drinking   Eat foods that have a lot of fiber, such as fresh fruits and vegetables, whole grains, and beans.  Limit foods that are high in fat, low in fiber, or overly processed, such as french fries, hamburgers, cookies, candies, and soda.  Drink enough fluid to keep your urine clear or pale yellow. General instructions  Exercise regularly or as told by your health care provider.  Go to the restroom when you have the urge to go. Do not hold it in.  Take over-the-counter and prescription medicines only as told by your health care provider. These include any fiber supplements.  Practice pelvic floor retraining exercises, such as deep breathing while relaxing the lower abdomen and pelvic floor relaxation during bowel movements.  Watch your condition for any changes.  Keep all follow-up visits as told by your health care provider. This is important. Contact a health care provider if:  You have pain that gets worse.  You have a fever.  You do not have a bowel movement after 4 days.  You vomit.  You are not hungry.  You lose weight.  You are bleeding from the anus.  You have thin, pencil-like  stools. Get help right away if:  You have a fever and your symptoms suddenly get worse.  You leak stool or have blood in your stool.  Your abdomen is bloated.  You have severe pain in your abdomen.  You feel dizzy or you faint. This information is not intended to replace advice given to you by your health care provider. Make sure you discuss any questions you have with your health care provider. Document Released: 03/04/2004 Document Revised: 12/25/2015 Document Reviewed: 11/25/2015 Elsevier Interactive Patient Education  2017 [employer].

## 2016-11-20 DIAGNOSIS — R198 Other specified symptoms and signs involving the digestive system and abdomen: Secondary | ICD-10-CM | POA: Insufficient documentation

## 2016-11-20 NOTE — Assessment & Plan Note (Signed)
Improved with stretching of hamstrings initiated by PT

## 2016-11-20 NOTE — Assessment & Plan Note (Signed)
New onset.  With normal labs,  Normal abdominal film, normal rectal exam.  Recommend [place] oe cathartic (GoLytely) followed by use of miralax /stool softener.

## 2016-12-27 DIAGNOSIS — H43812 Vitreous degeneration, left eye: Secondary | ICD-10-CM | POA: Diagnosis not present

## 2016-12-27 DIAGNOSIS — H353223 Exudative age-related macular degeneration, left eye, with inactive scar: Secondary | ICD-10-CM | POA: Diagnosis not present

## 2016-12-27 DIAGNOSIS — H35431 Paving stone degeneration of retina, right eye: Secondary | ICD-10-CM | POA: Diagnosis not present

## 2016-12-27 DIAGNOSIS — H353211 Exudative age-related macular degeneration, right eye, with active choroidal neovascularization: Secondary | ICD-10-CM | POA: Diagnosis not present

## 2017-01-10 DIAGNOSIS — D2261 Melanocytic nevi of right upper limb, including shoulder: Secondary | ICD-10-CM | POA: Diagnosis not present

## 2017-01-10 DIAGNOSIS — Z85828 Personal history of other malignant neoplasm of skin: Secondary | ICD-10-CM | POA: Diagnosis not present

## 2017-01-10 DIAGNOSIS — X32XXXA Exposure to sunlight, initial encounter: Secondary | ICD-10-CM | POA: Diagnosis not present

## 2017-01-10 DIAGNOSIS — L570 Actinic keratosis: Secondary | ICD-10-CM | POA: Diagnosis not present

## 2017-01-10 DIAGNOSIS — D225 Melanocytic nevi of trunk: Secondary | ICD-10-CM | POA: Diagnosis not present

## 2017-01-10 DIAGNOSIS — D2272 Melanocytic nevi of left lower limb, including hip: Secondary | ICD-10-CM | POA: Diagnosis not present

## 2017-02-14 DIAGNOSIS — H35431 Paving stone degeneration of retina, right eye: Secondary | ICD-10-CM | POA: Diagnosis not present

## 2017-02-14 DIAGNOSIS — H353211 Exudative age-related macular degeneration, right eye, with active choroidal neovascularization: Secondary | ICD-10-CM | POA: Diagnosis not present

## 2017-02-14 DIAGNOSIS — H43812 Vitreous degeneration, left eye: Secondary | ICD-10-CM | POA: Diagnosis not present

## 2017-02-14 DIAGNOSIS — H353223 Exudative age-related macular degeneration, left eye, with inactive scar: Secondary | ICD-10-CM | POA: Diagnosis not present

## 2017-03-22 ENCOUNTER — Ambulatory Visit (INDEPENDENT_AMBULATORY_CARE_PROVIDER_SITE_OTHER): Payer: PPO | Admitting: *Deleted

## 2017-03-22 ENCOUNTER — Telehealth: Payer: Self-pay | Admitting: Internal Medicine

## 2017-03-22 DIAGNOSIS — M818 Other osteoporosis without current pathological fracture: Secondary | ICD-10-CM | POA: Diagnosis not present

## 2017-03-22 MED ORDER — DENOSUMAB 60 MG/ML ~~LOC~~ SOLN
60.0000 mg | Freq: Once | SUBCUTANEOUS | Status: AC
  Administered 2017-03-22: 60 mg via SUBCUTANEOUS

## 2017-03-22 NOTE — Telephone Encounter (Signed)
Pt called back regarding his prolia injection. Thank you!

## 2017-03-22 NOTE — Telephone Encounter (Signed)
Pt called asking about his Prolia shot. Pt states that he is due today. Please advise, thank you!  Call pt @ [phone]

## 2017-03-22 NOTE — Progress Notes (Addendum)
Patient presented for 6 month prolia injection given Sub Q back of left arm.  Patient tolerated well.    I have reviewed the above information and agree with above.   Deborra Medina, MD

## 2017-03-28 ENCOUNTER — Telehealth: Payer: Self-pay | Admitting: Internal Medicine

## 2017-03-28 DIAGNOSIS — R5383 Other fatigue: Secondary | ICD-10-CM

## 2017-03-28 DIAGNOSIS — E559 Vitamin D deficiency, unspecified: Secondary | ICD-10-CM

## 2017-03-28 DIAGNOSIS — E7800 Pure hypercholesterolemia, unspecified: Secondary | ICD-10-CM

## 2017-03-28 DIAGNOSIS — Z8546 Personal history of malignant neoplasm of prostate: Secondary | ICD-10-CM

## 2017-03-28 DIAGNOSIS — R252 Cramp and spasm: Secondary | ICD-10-CM

## 2017-03-28 NOTE — Telephone Encounter (Signed)
Patient is wanting labs before his appointment on Monday for annual, I have pended what you have ordered in the past and scheduled lab appointment for Friday ?

## 2017-03-28 NOTE — Telephone Encounter (Signed)
Yes thanks but FYI:  Medicare PSA not regular PSA for all men with medicare

## 2017-03-31 ENCOUNTER — Other Ambulatory Visit (INDEPENDENT_AMBULATORY_CARE_PROVIDER_SITE_OTHER): Payer: PPO

## 2017-03-31 DIAGNOSIS — R5383 Other fatigue: Secondary | ICD-10-CM | POA: Diagnosis not present

## 2017-03-31 DIAGNOSIS — Z8546 Personal history of malignant neoplasm of prostate: Secondary | ICD-10-CM | POA: Diagnosis not present

## 2017-03-31 DIAGNOSIS — E7800 Pure hypercholesterolemia, unspecified: Secondary | ICD-10-CM | POA: Diagnosis not present

## 2017-03-31 DIAGNOSIS — E559 Vitamin D deficiency, unspecified: Secondary | ICD-10-CM | POA: Diagnosis not present

## 2017-03-31 LAB — LIPID PANEL
Cholesterol: 199 mg/dL (ref 0–200)
HDL: 39 mg/dL — ABNORMAL LOW (ref >39.00)
LDL Cholesterol: 128 mg/dL — ABNORMAL HIGH (ref 0–99)
NonHDL: 160.02
Total CHOL/HDL Ratio: 5
Triglycerides: 159 mg/dL — ABNORMAL HIGH (ref 0.0–149.0)
VLDL: 31.8 mg/dL (ref 0.0–40.0)

## 2017-03-31 LAB — CBC WITH DIFFERENTIAL/PLATELET
Basophils Absolute: 0 10*3/uL (ref 0.0–0.1)
Basophils Relative: 0.6 % (ref 0.0–3.0)
Eosinophils Absolute: 0.1 10*3/uL (ref 0.0–0.7)
Eosinophils Relative: 1.9 % (ref 0.0–5.0)
HCT: 43.1 % (ref 39.0–52.0)
Hemoglobin: 14.5 g/dL (ref 13.0–17.0)
Lymphocytes Relative: 30.3 % (ref 12.0–46.0)
Lymphs Abs: 1.3 10*3/uL (ref 0.7–4.0)
MCHC: 33.7 g/dL (ref 30.0–36.0)
MCV: 91.8 fl (ref 78.0–100.0)
Monocytes Absolute: 0.4 10*3/uL (ref 0.1–1.0)
Monocytes Relative: 9.4 % (ref 3.0–12.0)
Neutro Abs: 2.5 10*3/uL (ref 1.4–7.7)
Neutrophils Relative %: 57.8 % (ref 43.0–77.0)
Platelets: 157 10*3/uL (ref 150.0–400.0)
RBC: 4.69 Mil/uL (ref 4.22–5.81)
RDW: 13.6 % (ref 11.5–15.5)
WBC: 4.4 10*3/uL (ref 4.0–10.5)

## 2017-03-31 LAB — TSH: TSH: 4.98 u[IU]/mL — ABNORMAL HIGH (ref 0.35–4.50)

## 2017-03-31 LAB — VITAMIN D 25 HYDROXY (VIT D DEFICIENCY, FRACTURES): VITD: 41.77 ng/mL (ref 30.00–100.00)

## 2017-03-31 LAB — COMPREHENSIVE METABOLIC PANEL
ALT: 30 U/L (ref 0–53)
AST: 26 U/L (ref 0–37)
Albumin: 4.2 g/dL (ref 3.5–5.2)
Alkaline Phosphatase: 42 U/L (ref 39–117)
BUN: 17 mg/dL (ref 6–23)
CO2: 32 mEq/L (ref 19–32)
Calcium: 8.2 mg/dL — ABNORMAL LOW (ref 8.4–10.5)
Chloride: 103 mEq/L (ref 96–112)
Creatinine, Ser: 1.01 mg/dL (ref 0.40–1.50)
GFR: 75 mL/min (ref >60.00)
Glucose, Bld: 99 mg/dL (ref 70–99)
Potassium: 4 mEq/L (ref 3.5–5.1)
Sodium: 142 mEq/L (ref 135–145)
Total Bilirubin: 0.8 mg/dL (ref 0.2–1.2)
Total Protein: 6.4 g/dL (ref 6.0–8.3)

## 2017-03-31 LAB — LDL CHOLESTEROL, DIRECT: Direct LDL: 129 mg/dL

## 2017-03-31 LAB — PSA, MEDICARE: PSA: 0.57 ng/ml (ref 0.10–4.00)

## 2017-04-02 ENCOUNTER — Encounter: Payer: Self-pay | Admitting: Internal Medicine

## 2017-04-03 ENCOUNTER — Ambulatory Visit (INDEPENDENT_AMBULATORY_CARE_PROVIDER_SITE_OTHER): Payer: PPO

## 2017-04-03 ENCOUNTER — Encounter: Payer: Self-pay | Admitting: Internal Medicine

## 2017-04-03 ENCOUNTER — Ambulatory Visit (INDEPENDENT_AMBULATORY_CARE_PROVIDER_SITE_OTHER): Payer: PPO | Admitting: Internal Medicine

## 2017-04-03 VITALS — BP 102/62 | HR 76 | Temp 98.3°F | Resp 15 | Ht 76.0 in | Wt 192.8 lb

## 2017-04-03 DIAGNOSIS — R51 Headache: Secondary | ICD-10-CM | POA: Diagnosis not present

## 2017-04-03 DIAGNOSIS — M542 Cervicalgia: Secondary | ICD-10-CM

## 2017-04-03 DIAGNOSIS — R252 Cramp and spasm: Secondary | ICD-10-CM

## 2017-04-03 DIAGNOSIS — Z8546 Personal history of malignant neoplasm of prostate: Secondary | ICD-10-CM | POA: Diagnosis not present

## 2017-04-03 DIAGNOSIS — M818 Other osteoporosis without current pathological fracture: Secondary | ICD-10-CM

## 2017-04-03 DIAGNOSIS — E538 Deficiency of other specified B group vitamins: Secondary | ICD-10-CM | POA: Diagnosis not present

## 2017-04-03 DIAGNOSIS — Z0000 Encounter for general adult medical examination without abnormal findings: Secondary | ICD-10-CM

## 2017-04-03 DIAGNOSIS — R519 Headache, unspecified: Secondary | ICD-10-CM | POA: Insufficient documentation

## 2017-04-03 DIAGNOSIS — Z1331 Encounter for screening for depression: Secondary | ICD-10-CM

## 2017-04-03 LAB — B12 AND FOLATE PANEL
Folate: 18.5 ng/mL (ref >5.9)
Vitamin B-12: 286 pg/mL (ref 211–911)

## 2017-04-03 NOTE — Assessment & Plan Note (Signed)
Managed with quinine via tonic water.  Intolerant of magnesium Recommended tiral of Epsom salts in bath waer.

## 2017-04-03 NOTE — Assessment & Plan Note (Signed)
Diagnosed in 2006,  Managed with Lupro and XRT.   No change in PSA done today  Lab Results  Component Value Date   PSA 0.57 03/31/2017   PSA 0.72 03/30/2016   PSA 0.57 03/25/2015

## 2017-04-03 NOTE — Assessment & Plan Note (Signed)
Secondary to use of Lupron for prostate CA.  Managed with Prolia . Repeat DEXA in 2017 showed slight improvement in scores   Will repeat in 2019.  Lat dose Oct 2018

## 2017-04-03 NOTE — Patient Instructions (Addendum)
TRY CHANGING  YOUR  PILLOW TO ONE WITH MORE NECK SUPPORT TO SEE IF IT IMPROVES THE HEADACHE.   IF THIS DOES NOT HELP,  WE CAN CHECK AN OVERNIGHT PULSE OXIMETRY TO MAKE SURE YOU ARE NOT DROPPING YOUR OXYGEN LEVEL    . We discussed my concern about continuing your PPI (OMEPRAZOLE )  in light of the recently published studies suggesting an association with increased risk of dementia and kidney failure.  I advised  you to try using  famotidine 20 mg  ONCE DAILY , and I sent  an rx to your pharmacy.    if your reflux symptoms are controlled,  You can Continue the daily h2 blocker. If not,  We will resume a covered PPI

## 2017-04-03 NOTE — Patient Instructions (Addendum)
  George Owens , Thank you for taking time to come for your Medicare Wellness Visit. I appreciate your ongoing commitment to your health goals. Please review the following plan we discussed and let me know if I can assist you in the future.   These are the goals we discussed: Goals    . Healthy Lifestyle          Stay hydrated and drink plenty of fluids Maintain exercise regimen Low carb foods       This is a list of the screening recommended for you and due dates:  Health Maintenance  Topic Date Due  . Tetanus Vaccine  05/01/2023  . Flu Shot  Completed  . Pneumonia vaccines  Completed

## 2017-04-03 NOTE — Progress Notes (Signed)
Subjective:   George Owens is a 83 y.o. male who presents for Medicare Annual/Subsequent preventive examination.  Review of Systems:  No ROS.  Medicare Wellness Visit. Additional risk factors are reflected in the social history. Cardiac Risk Factors include: advanced age (>48men, >33 women);male gender     Objective:    Vitals: BP 102/62 (BP Location: Left Arm, Patient Position: Sitting, Cuff Size: Normal)   Pulse 76   Temp 98.3 F (36.8 C) (Oral)   Resp 15   Ht 6\' 4"  (1.93 m)   Wt 192 lb 12.8 oz (87.5 kg)   SpO2 95%   BMI 23.47 kg/m   Body mass index is 23.47 kg/m.  Tobacco History  Smoking Status  . Never Smoker  Smokeless Tobacco  . Never Used     Counseling given: Not Answered   Past Medical History:  Diagnosis Date  . Allergy   . Chicken pox   . Colon polyps   . history of prostate CA   . Hyperlipidemia   . Leg cramps   . Neuropathy    Past Surgical History:  Procedure Laterality Date  . EYE SURGERY    . TONSILECTOMY, ADENOIDECTOMY, BILATERAL MYRINGOTOMY AND TUBES     Family History  Problem Relation Age of Onset  . Stroke Mother   . Arthritis Father   . Heart disease Father    History  Sexual Activity  . Sexual activity: Yes  . Partners: Female    Outpatient Encounter Prescriptions as of 04/03/2017  Medication Sig  . Calcium Carb-Cholecalciferol (CALCIUM 1000 + D PO) Take 2 tablets by mouth daily.   . Cholecalciferol (VITAMIN D3) 400 UNITS CAPS Take 1 capsule by mouth daily.  . Glucosamine-Chondroit-Vit C-Mn (GLUCOSAMINE CHONDR 1500 COMPLX) CAPS Take 1 capsule by mouth daily.   [name] loratadine (CLARITIN) 10 MG tablet Take 10 mg by mouth daily as needed.   . Multiple Vitamins-Minerals (PRESERVISION AREDS PO) Take 2 capsules by mouth daily.  [name] omeprazole (PRILOSEC) 40 MG capsule TAKE ONE CAPSULE BY MOUTH EVERY DAY  . naproxen (NAPROSYN) 250 MG tablet Take 250 mg by mouth as needed.   No facility-administered encounter medications on file  as of 04/03/2017.     Activities of Daily Living In your present state of health, do you have any difficulty performing the following activities: 04/03/2017  Hearing? N  Vision? Y  Difficulty concentrating or making decisions? N  Walking or climbing stairs? Y  Comment He notes unsteady gait.  No falls.  Dressing or bathing? N  Doing errands, shopping? Y  Comment Does not drive due to vision  Preparing Food and eating ? N  Using the Toilet? N  In the past six months, have you accidently leaked urine? N  Do you have problems with loss of bowel control? N  Managing your Medications? N  Managing your Finances? N  Housekeeping or managing your Housekeeping? N  Some recent data might be hidden    Patient Care Team: Crecencio Mc, MD as PCP - General (Internal Medicine)   Assessment:    This is a routine wellness examination for [place]. The goal of the wellness visit is to assist the patient how to close the gaps in care and create a preventative care plan for the patient.   The roster of all physicians providing medical care to patient is listed in the Snapshot section of the chart.  Taking calcium VIT D as appropriate. Osteoporosis reviewed.  Prolia injections currently administered  every 6 months.  Safety issues reviewed; Smoke and carbon monoxide detectors in the home. No firearms in the home.  Wears seatbelts when riding with others. Patient does wear sunscreen or protective clothing when in direct sunlight. No violence in the home.  Depression- PHQ 2 &9 complete.  No signs/symptoms or verbal communication regarding little pleasure in doing things, feeling down, depressed or hopeless. No changes in sleeping, energy, eating, concentrating.  No thoughts of self harm or harm towards others.  Time spent on this topic is 10 minutes.   Patient is alert, normal appearance, oriented to person/place/and time.  Correctly identified the president of the [place], recall of 3/3 words, and  performing simple calculations. Displays appropriate judgement and can read correct time from watch face.   No new identified risk were noted.  No failures at ADL's or IADL's.    BMI- discussed the importance of a healthy diet, water intake and the benefits of aerobic exercise. Educational material provided.   24 hour diet recall: Low carb foods  Daily fluid intake: 1 cup of sweet tea, lemonade and tonic water.  Sips additional water throughout the day. Encouraged an increase with water intake.  Dental- every 6 months. Dental bridge upper. Dr. Delfino Lovett Daily.  Eye- Visual acuity not assessed per patient preference since they have regular follow up with the ophthalmologist.  Wears corrective lenses.  Sleep patterns- Sleeps 6 hours at night.  Wakes feeling rested. Nocturia x1-2.  Naps as needed.  Health maintenance gaps- closed.  Patient Concerns: None at this time. Follow up with PCP as needed.  Exercise Activities and Dietary recommendations Current Exercise Habits: Home exercise routine, Type of exercise: stretching;walking (Chair and standing exercises.  H20 volleyball, swimming.), Time (Minutes): 45, Frequency (Times/Week): 4, Weekly Exercise (Minutes/Week): 180, Intensity: Mild  Goals    . Healthy Lifestyle          Stay hydrated and drink plenty of fluids Maintain exercise regimen Low carb foods      Fall Risk Fall Risk  04/03/2017 11/01/2016 03/23/2016 03/19/2014  Falls in the past year? No No No No   Depression Screen PHQ 2/9 Scores 04/03/2017 11/01/2016 03/23/2016 03/19/2014  PHQ - 2 Score 0 0 0 0  PHQ- 9 Score 0 - - -    Cognitive Function MMSE - Mini Mental State Exam 03/23/2016  Orientation to time 5  Orientation to Place 5  Registration 3  Attention/ Calculation 5  Recall 3  Language- name 2 objects 2  Language- repeat 1  Language- follow 3 step command 3  Language- read & follow direction 1  Write a sentence 1  Copy design 1  Total score 30     6CIT  Screen 04/03/2017  What Year? 0 points  What month? 0 points  What time? 0 points  Count back from 20 0 points  Months in reverse 0 points  Repeat phrase 0 points  Total Score 0    Immunization History  Administered Date(s) Administered  . Influenza Split 04/18/2013  . Influenza, High Dose Seasonal PF 03/30/2016  . Influenza-Unspecified 03/06/2017  . Pneumococcal Conjugate-13 10/04/2013  . Pneumococcal Polysaccharide-23 02/28/2012  . Tdap 04/30/2013  . Zoster 11/24/2010   Screening Tests Health Maintenance  Topic Date Due  . TETANUS/TDAP  05/01/2023  . INFLUENZA VACCINE  Completed  . PNA vac Low Risk Adult  Completed      Plan:    End of life planning; Advance aging; Advanced directives discussed. Copy of current HCPOA  requested.    I have personally reviewed and noted the following in the patient's chart:   . Medical and social history . Use of alcohol, tobacco or illicit drugs  . Current medications and supplements . Functional ability and status . Nutritional status . Physical activity . Advanced directives . List of other physicians . Hospitalizations, surgeries, and ER visits in previous 12 months . Vitals . Screenings to include cognitive, depression, and falls . Referrals and appointments  In addition, I have reviewed and discussed with patient certain preventive protocols, quality metrics, and best practice recommendations. A written personalized care plan for preventive services as well as general preventive health recommendations were provided to patient.     OBrien-Blaney, Cohan Stipes L, LPN  31/51/[id number]    I have reviewed the above information and agree with above.   Deborra Medina, MD

## 2017-04-03 NOTE — Progress Notes (Signed)
Patient ID: George Owens, male    DOB: 1931-10-10  Age: 83 y.o. MRN: [id number]  The patient is here for  Follow up on chronic and acute problems.   The risk factors are reflected in the social history. The roster of all physicians providing medical care to patient - is listed in the Snapshot section of the chart.  Activities of daily living:  The patient is 100% independent in all ADLs: dressing, toileting, feeding as well as independent mobility  Home safety : The patient has smoke detectors in the home. They wear seatbelts.  There are no firearms at home. There is no violence in the home.   There is no risks for hepatitis, STDs or HIV. There is no   history of blood transfusion. They have no travel history to infectious disease endemic areas of the world.  The patient has seen their dentist in the last six month. They have seen their eye doctor in the last year. They admit to slight hearing difficulty with regard to whispered voices and some television programs.  They have deferred audiologic testing in the last year.  They do not  have excessive sun exposure. Discussed the need for sun protection: hats, long sleeves and use of sunscreen if there is significant sun exposure.   Diet: the importance of a healthy diet is discussed. They do have a healthy diet.  The benefits of regular aerobic exercise were discussed. She walks 4 times per week ,  20 minutes.   Depression screen: there are no signs or vegative symptoms of depression- irritability, change in appetite, anhedonia, sadness/tearfullness.  Cognitive assessment: the patient manages all their financial and personal affairs and is actively engaged. They could relate day,date,year and events; recalled 2/3 objects at 3 minutes; performed clock-face test normally.  The following portions of the patient's history were reviewed and updated as appropriate: allergies, current medications, past family history, past medical history,  past  surgical history, past social history  and problem list.  Visual acuity was not assessed per patient preference since she has regular follow up with her ophthalmologist. Hearing and body mass index were assessed and reviewed.   During the course of the visit the patient was educated and counseled about appropriate screening and preventive services including : fall prevention , diabetes screening, nutrition counseling, colorectal cancer screening, and recommended immunizations.    CC: The primary encounter diagnosis was B12 deficiency. Diagnoses of History of prostate cancer, Muscle cramps, Other osteoporosis without current pathological fracture, Neck pain, and Morning headache were also pertinent to this visit.   Frequent nocturnal Leg cramps.  Drinking 8 ounces of  tonic  WATER which is helping .  Trial of Magnesium upset  Stomach.  Rib pain, sharp , brought on with extreme twisting to the left and right . Not at resit and with normal activities. worried about prostate Ca  Spreading to the ribs because of sharp pain bought about by twisting severely .    Fasting labs reviewed..  \ Statin intolerance history due to severe myalgias.    Constipation ,controlled. TAKES OMEPRAZOLE DAILY DUE TO   Pain improved after working with PT George Owens at  [facility]  for tight hamstrings    No longer driving  Due to Macular degeneration.  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  right eye deteriorating  Wakes up with a dull headache every morning,  Gone by time he eats breKFAST  AN HOUR LATER  DULL BEHIND THE EYES ,  NOT THROBBING .  Does not snore . Head pain is in the forehead.    TOOK AN 8 WEEK COURSE 2 HR CLASSES ONCE A WEEK BY COUNCIL ON THE AGIIG  TAUGHT BY 2 PTS INCLUDING BECKY BLAZER  PT  ,      [name] has a past medical history of Allergy; Chicken pox; Colon polyps; history of prostate CA; Hyperlipidemia; Leg cramps; and Neuropathy.   He has a past surgical history that includes Tonsilectomy,  adenoidectomy, bilateral myringotomy and tubes and Eye surgery.   His family history includes Arthritis in his father; Heart disease in his father; Stroke in his mother.He reports that he has never smoked. He has never used smokeless tobacco. He reports that he does not drink alcohol or use drugs.  Outpatient Medications Prior to Visit  Medication Sig Dispense Refill  . Calcium Carb-Cholecalciferol (CALCIUM 1000 + D PO) Take 2 tablets by mouth daily.     . Cholecalciferol (VITAMIN D3) 400 UNITS CAPS Take 1 capsule by mouth daily.    . Glucosamine-Chondroit-Vit C-Mn (GLUCOSAMINE CHONDR 1500 COMPLX) CAPS Take 1 capsule by mouth daily.     [name] loratadine (CLARITIN) 10 MG tablet Take 10 mg by mouth daily as needed.     . Multiple Vitamins-Minerals (PRESERVISION AREDS PO) Take 2 capsules by mouth daily.    . naproxen (NAPROSYN) 250 MG tablet Take 250 mg by mouth as needed.    [name] omeprazole (PRILOSEC) 40 MG capsule TAKE ONE CAPSULE BY MOUTH EVERY DAY 90 capsule 3   No facility-administered medications prior to visit.     Review of Systems   Patient denies headache, fevers, malaise, unintentional weight loss, skin rash, eye pain, sinus congestion and sinus pain, sore throat, dysphagia,  hemoptysis , cough, dyspnea, wheezing, chest pain, palpitations, orthopnea, edema, abdominal pain, nausea, melena, diarrhea, constipation, flank pain, dysuria, hematuria, urinary  Frequency, nocturia, numbness, tingling, seizures,  Focal weakness, Loss of consciousness,  Tremor, insomnia, depression, anxiety, and suicidal ideation.      Objective:  BP 102/62 (BP Location: Left Arm, Patient Position: Sitting, Cuff Size: Normal)   Pulse 76   Temp 98.3 F (36.8 C) (Oral)   Resp 15   Ht 6\' 4"  (1.93 m)   Wt 192 lb 12.8 oz (87.5 kg)   SpO2 95%   BMI 23.47 kg/m   Physical Exam   General appearance: alert, cooperative and appears stated age Ears: normal TM's and external ear canals both ears Throat: lips, mucosa,  and tongue normal; teeth and gums normal Neck: no adenopathy, no carotid bruit, supple, symmetrical, trachea midline and thyroid not enlarged, symmetric, no tenderness/mass/nodules Back: symmetric, no curvature. ROM normal. No CVA tenderness. Lungs: clear to auscultation bilaterally Heart: regular rate and rhythm, S1, S2 normal, no murmur, click, rub or gallop Abdomen: soft, non-tender; bowel sounds normal; no masses,  no organomegaly Pulses: 2+ and symmetric Skin: Skin color, texture, turgor normal. No rashes or lesions Lymph nodes: Cervical, supraclavicular, and axillary nodes normal. Neuro: CNs 2-12 intact. DTRs 2+/4 in biceps, brachioradialis, patellars and achilles. Muscle strength 5/5 in upper and lower exremities. Fine resting tremor bilaterally both hands cerebellar function normal. Romberg negative.  No pronator drift.   Gait normal.     Assessment & Plan:   Problem List Items Addressed This Visit    History of prostate cancer    Diagnosed in 2006,  Managed with Lupro and XRT.   No change in PSA done today  Lab Results  Component Value Date  PSA 0.57 03/31/2017   PSA 0.72 03/30/2016   PSA 0.57 03/25/2015         Morning headache    Occurring frequently but not daily  Forehead, behind eyes.  No associated symptoms of blurred or double visiotn,  No temple tenderness, always resolved after bein up for  1 hour .  Recommended trial of cervical pillow followed by sleeps study and MRI of persistent.       Muscle cramps    Managed with quinine via tonic water.  Intolerant of magnesium Recommended tiral of Epsom salts in bath waer.       Neck pain   Osteoporosis    Secondary to use of Lupron for prostate CA.  Managed with Prolia . Repeat DEXA in 2017 showed slight improvement in scores   Will repeat in 2019.  Lat dose Oct 2018       Other Visit Diagnoses    B12 deficiency    -  Primary   Relevant Orders   B12 and Folate Panel      I am having Mr. Owens maintain  his GLUCOSAMINE CHONDR 1500 COMPLX, loratadine, Calcium Carb-Cholecalciferol (CALCIUM 1000 + D PO), Vitamin D3, omeprazole, Multiple Vitamins-Minerals (PRESERVISION AREDS PO), and naproxen.  No orders of the defined types were placed in this encounter.   There are no discontinued medications.  Follow-up: Return in about 1 year (around 04/03/2018).   Crecencio Mc, MD

## 2017-04-03 NOTE — Assessment & Plan Note (Signed)
Occurring frequently but not daily  Forehead, behind eyes.  No associated symptoms of blurred or double visiotn,  No temple tenderness, always resolved after bein up for  1 hour .  Recommended trial of cervical pillow followed by sleeps study and MRI of persistent.

## 2017-04-04 ENCOUNTER — Other Ambulatory Visit: Payer: Self-pay | Admitting: Internal Medicine

## 2017-04-04 ENCOUNTER — Encounter: Payer: Self-pay | Admitting: Internal Medicine

## 2017-04-04 DIAGNOSIS — H353223 Exudative age-related macular degeneration, left eye, with inactive scar: Secondary | ICD-10-CM | POA: Diagnosis not present

## 2017-04-04 DIAGNOSIS — H35431 Paving stone degeneration of retina, right eye: Secondary | ICD-10-CM | POA: Diagnosis not present

## 2017-04-04 DIAGNOSIS — H43813 Vitreous degeneration, bilateral: Secondary | ICD-10-CM | POA: Diagnosis not present

## 2017-04-04 DIAGNOSIS — H353211 Exudative age-related macular degeneration, right eye, with active choroidal neovascularization: Secondary | ICD-10-CM | POA: Diagnosis not present

## 2017-04-21 ENCOUNTER — Telehealth: Payer: Self-pay | Admitting: Internal Medicine

## 2017-04-21 MED ORDER — FAMOTIDINE 20 MG PO TABS: 20.0000 mg | ORAL_TABLET | Freq: Two times a day (BID) | ORAL | 4 refills | Status: DC

## 2017-04-21 NOTE — Telephone Encounter (Signed)
Patient stated you advised him that you didi not recommend omeprazole for reflux and would call in him another medication.

## 2017-04-21 NOTE — Telephone Encounter (Signed)
Copied from [place] [phone]. Topic: Quick Communication - See Telephone Encounter >> Apr 21, 2017 11:05 AM Boyd Kerbs wrote: CRM for notification. See Telephone encounter for:  04/21/17. Pharmacy told him Ameproso reflux medicine was called in but he said dr. Derrel Nip said she was not going to have him on this and would get a different drug.

## 2017-04-21 NOTE — Telephone Encounter (Signed)
Famotidine 20 mg twice daily sent.  If reflux symptoms return,  Resume omeprazole

## 2017-04-21 NOTE — Telephone Encounter (Signed)
Please advise 

## 2017-04-21 NOTE — Telephone Encounter (Signed)
Patient said to please call him on his phone & not his wifes. His number [phone]

## 2017-04-24 NOTE — Telephone Encounter (Signed)
Patient notified and voiced understanding.

## 2017-05-10 ENCOUNTER — Telehealth: Payer: Self-pay | Admitting: Internal Medicine

## 2017-05-10 MED ORDER — FAMOTIDINE 20 MG PO TABS: 20.0000 mg | ORAL_TABLET | Freq: Two times a day (BID) | ORAL | 4 refills | Status: DC

## 2017-05-10 NOTE — Telephone Encounter (Signed)
This was already taken care of.

## 2017-05-10 NOTE — Telephone Encounter (Signed)
Message from patient requesting pepcid (as ordered by Dr. Derrel Nip) to be called in to pharmacy. Pepcid was sent to [facility] pharmacy. Called to inform patient.  Left vm.

## 2017-05-10 NOTE — Telephone Encounter (Signed)
Copied from [place]. Topic: Quick Communication - See Telephone Encounter >> May 10, 2017 10:32 AM Boyd Kerbs wrote: CRM for notification. See Telephone encounter for:   [place] called saying there is another problem with the medication not going in correctly. The pharmacy filled one for Omeprazole. This is the old prescription. Dr. Derrel Nip said she wanted to change his medication due to long term effects.  He is out and needs this filled today.  05/10/17. >> May 10, 2017  3:09 PM Neva Seat wrote: Pt needs new prescription order by 4:30.  [phone] - Home [phone] - cell phone

## 2017-05-10 NOTE — Telephone Encounter (Signed)
Spoke with pt to let him know that Dr. Derrel Nip had advised him to try taking the pepcid instead of the omeprazole. The pt stated that the pharmacy stated that they did not have the pepcid rx so the rx was resent. Pt stated that he would call the pharmacy to make sure that they receive it this time.

## 2017-05-10 NOTE — Telephone Encounter (Signed)
Copied from [place]. Topic: Quick Communication - See Telephone Encounter >> May 10, 2017 10:32 AM Boyd Kerbs wrote: CRM for notification. See Telephone encounter for:   George Owens called saying there is another problem with the medication not going in correctly. The pharmacy filled one for Omeprazole. This is the old prescription. Dr. Derrel Nip said she wanted to change his medication due to long term effects.  He is out and needs this filled today.  Patient has been speaking with Kerin Salen  05/10/17.

## 2017-05-10 NOTE — Telephone Encounter (Signed)
Please advise 

## 2017-05-10 NOTE — Telephone Encounter (Signed)
Rx  Inquiry

## 2017-05-24 DIAGNOSIS — H2522 Age-related cataract, morgagnian type, left eye: Secondary | ICD-10-CM | POA: Diagnosis not present

## 2017-05-24 DIAGNOSIS — H353223 Exudative age-related macular degeneration, left eye, with inactive scar: Secondary | ICD-10-CM | POA: Diagnosis not present

## 2017-05-24 DIAGNOSIS — H353211 Exudative age-related macular degeneration, right eye, with active choroidal neovascularization: Secondary | ICD-10-CM | POA: Diagnosis not present

## 2017-05-24 DIAGNOSIS — H35431 Paving stone degeneration of retina, right eye: Secondary | ICD-10-CM | POA: Diagnosis not present

## 2017-06-09 ENCOUNTER — Telehealth: Payer: Self-pay | Admitting: Internal Medicine

## 2017-06-09 MED ORDER — FAMOTIDINE 20 MG PO TABS: 20.0000 mg | ORAL_TABLET | Freq: Two times a day (BID) | ORAL | 4 refills | Status: DC

## 2017-06-09 NOTE — Telephone Encounter (Signed)
Copied from [place] [phone]. Topic: Quick Communication - See Telephone Encounter >> Jun 09, 2017  2:50 PM Hewitt Shorts wrote: CRM for notification. See Telephone encounter for:  Pt is needing a refill on pepcid   Best number [phone]   06/09/17.

## 2017-06-16 ENCOUNTER — Telehealth: Payer: Self-pay | Admitting: Internal Medicine

## 2017-06-16 ENCOUNTER — Other Ambulatory Visit: Payer: Self-pay | Admitting: Internal Medicine

## 2017-06-16 MED ORDER — OMEPRAZOLE 40 MG PO CPDR: 40.0000 mg | DELAYED_RELEASE_CAPSULE | Freq: Every day | ORAL | 3 refills | Status: DC

## 2017-06-16 NOTE — Telephone Encounter (Signed)
Pt  Called  Stating  The  Pepcid  Prescribed  By  Dr  Derrel Nip  On  Oct  15  Has  Not  Worked  As  Well  As  The  Omeprazole .  Pt  Needs to  Know if  There  Is  Another  Alternative    Or  If  He  Should   Continue  With  The      pepcid  For a  While  Longer.. The  Patient  States  He  Shares  Dr  Demetrios Isaacs  Concerns about omeprazole but he  has  Reflux  Symptoms  That he  Did  Not have  While  Taking   The  Omeprazole . Please  Advise

## 2017-06-16 NOTE — Telephone Encounter (Signed)
If he is having reflux without the omeprazole,  He should resume it.  We will monitor his vitamin d and b12 levels periodically

## 2017-06-16 NOTE — Telephone Encounter (Signed)
Please advise 

## 2017-06-16 NOTE — Telephone Encounter (Signed)
Spoke with pt and informed him of Dr. Lupita Dawn message below. Pt would like a new rx sent in for the omeprazole. Dr. Derrel Nip heard the conversation and stated that she would send in the medication.

## 2017-06-28 ENCOUNTER — Encounter: Payer: Self-pay | Admitting: Family Medicine

## 2017-06-28 ENCOUNTER — Ambulatory Visit (INDEPENDENT_AMBULATORY_CARE_PROVIDER_SITE_OTHER): Payer: PPO | Admitting: Family Medicine

## 2017-06-28 VITALS — BP 120/68 | HR 67 | Temp 97.9°F | Wt 194.0 lb

## 2017-06-28 DIAGNOSIS — R0982 Postnasal drip: Secondary | ICD-10-CM | POA: Insufficient documentation

## 2017-06-28 DIAGNOSIS — R0789 Other chest pain: Secondary | ICD-10-CM

## 2017-06-28 MED ORDER — AZELASTINE HCL 0.1 % NA SOLN: 1.0000 | Freq: Two times a day (BID) | NASAL | 1 refills | Status: DC

## 2017-06-28 NOTE — Progress Notes (Signed)
BP 120/68 (BP Location: Left Arm, Patient Position: Sitting, Cuff Size: Normal)   Pulse 67   Temp 97.9 F (36.6 C) (Oral)   Wt 194 lb (88 kg)   SpO2 99%   BMI 23.61 kg/m    CC: sinus problems Subjective:    Patient ID: George Owens, male    DOB: 1932/03/14, 84 y.o.   MRN: [id number]  HPI: George Owens is a 84 y.o. male presenting on 06/28/2017 for Sinus Problem (Sinus drainage, nausea,some GI issues and yesterday some chest tightness.  Started 05/2017.  Has changed reflux meds)   4+ week h/o intermittent sinus drainage worse over holidays associated with sore throat. Usually manages this with claritin and aspirin. Yesterday afternoon felt especially bad - ongoing throat drainage, some chest tightness, feverish (Tmax 99) and weak. Describes lower bilateral chest discomfort. Symptoms came on when he woke up from nap after a walk late afternoon, malaise lasted several hours into bedtime. Today feels well. Commencement of symptoms may have been associated with transition from PPI to pepcid last month, but GERD symptoms have significantly improved since restarting omeprazole at end of last month.   Ongoing rhinorrhea. Some L earache. Mild constipation with omeprazole - yesterday started taking miralax.  Denies jaw pain, left arm pain, dyspnea or nausea with episode yesterday.  Denies head congestion or pressure, sinus headache, ear or tooth pain, cough, or wheezing. No vomiting, dysphagia, or diarrhea or bowel changes.   No known h/o CAD.  He has not been taking claritin regularly.  He takes rare naprosyn.   Relevant past medical, surgical, family and social history reviewed and updated as indicated. Interim medical history since our last visit reviewed. Allergies and medications reviewed and updated. Outpatient Medications Prior to Visit  Medication Sig Dispense Refill  . Calcium Carb-Cholecalciferol (CALCIUM 1000 + D PO) Take 2 tablets by mouth daily.     . Cholecalciferol  (VITAMIN D3) 400 UNITS CAPS Take 1 capsule by mouth daily.    . famotidine (PEPCID) 20 MG tablet Take 1 tablet (20 mg total) by mouth 2 (two) times daily. 60 tablet 4  . Glucosamine-Chondroit-Vit C-Mn (GLUCOSAMINE CHONDR 1500 COMPLX) CAPS Take 1 capsule by mouth daily.     [name] loratadine (CLARITIN) 10 MG tablet Take 10 mg by mouth daily as needed.     . Multiple Vitamins-Minerals (PRESERVISION AREDS PO) Take 2 capsules by mouth daily.    . naproxen (NAPROSYN) 250 MG tablet Take 250 mg by mouth as needed.    [name] omeprazole (PRILOSEC) 40 MG capsule Take 1 capsule (40 mg total) by mouth daily. 90 capsule 3   No facility-administered medications prior to visit.     Past Medical History:  Diagnosis Date  . Allergy   . Chicken pox   . Colon polyps   . history of prostate CA   . Hyperlipidemia   . Leg cramps   . Neuropathy     Past Surgical History:  Procedure Laterality Date  . EYE SURGERY    . TONSILECTOMY, ADENOIDECTOMY, BILATERAL MYRINGOTOMY AND TUBES     Social History   Tobacco Use  . Smoking status: Never Smoker  . Smokeless tobacco: Never Used  Substance Use Topics  . Alcohol use: No  . Drug use: No    Family History  Problem Relation Age of Onset  . Stroke Mother   . Arthritis Father   . Heart disease Father     Per HPI unless specifically indicated in ROS  section below Review of Systems     Objective:    BP 120/68 (BP Location: Left Arm, Patient Position: Sitting, Cuff Size: Normal)   Pulse 67   Temp 97.9 F (36.6 C) (Oral)   Wt 194 lb (88 kg)   SpO2 99%   BMI 23.61 kg/m   Wt Readings from Last 3 Encounters:  06/28/17 194 lb (88 kg)  04/03/17 192 lb 12.8 oz (87.5 kg)  04/03/17 192 lb 12.8 oz (87.5 kg)    Physical Exam  Constitutional: He appears well-developed and well-nourished. No distress.  HENT:  Head: Normocephalic and atraumatic.  Right Ear: Hearing, tympanic membrane, external ear and ear canal normal.  Left Ear: Hearing, tympanic membrane,  external ear and ear canal normal.  Nose: Nose normal. No mucosal edema or rhinorrhea. Right sinus exhibits no maxillary sinus tenderness and no frontal sinus tenderness. Left sinus exhibits no maxillary sinus tenderness and no frontal sinus tenderness.  Mouth/Throat: Uvula is midline, oropharynx is clear and moist and mucous membranes are normal. No oropharyngeal exudate, posterior oropharyngeal edema, posterior oropharyngeal erythema or tonsillar abscesses.  Eyes: Conjunctivae and EOM are normal. Pupils are equal, round, and reactive to light. No scleral icterus.  Neck: Normal range of motion. Neck supple. No thyromegaly present.  Cardiovascular: Normal rate, regular rhythm, normal heart sounds and intact distal pulses.  No murmur heard. Pulmonary/Chest: Effort normal and breath sounds normal. No respiratory distress. He has no wheezes. He has no rales. He exhibits no tenderness.  Lymphadenopathy:    He has no cervical adenopathy.  Skin: Skin is warm and dry. No rash noted.  Nursing note and vitals reviewed.  Results for orders placed or performed in visit on 04/03/17  B12 and Folate Panel  Result Value Ref Range   Vitamin B-12 286 211 - 911 pg/mL   Folate 18.5 >5.9 ng/mL   No results found for: HGBA1C  Lab Results  Component Value Date   CREATININE 1.01 03/31/2017   BUN 17 03/31/2017   NA 142 03/31/2017   K 4.0 03/31/2017   CL 103 03/31/2017   CO2 32 03/31/2017    EKG - NSR rate 60s, normal axis, intervals, no acute ST/T changes, ?p mitrale    Assessment & Plan:  Over 25 minutes were spent face-to-face with the patient during this encounter and >50% of that time was spent on counseling and coordination of care  Problem List Items Addressed This Visit    Post-nasal drainage - Primary    Post nasal drainage with nonspecific chest discomfort initial episode yesterday that has since resolved. Does not sound cardiac in nature. Overall benign exam today. Regardless, update EKG today.  I think this was more sequelae from chronic rhinorrhea, post nasal drainage. I did recommend he restart claritin regularly, initially recommended INS but after noticing TCI allergy will prescribe trial of astelin nasal spray. Encouraged nasal saline irrigation. Continue daily omeprazole which helps control GERD better. F/u with PCP if not improving with treatment reviewed.  Denies significant nasal congestion.       Other Visit Diagnoses    Chest tightness       Relevant Orders   EKG 12-Lead (Completed)       Follow up plan: Return if symptoms worsen or fail to improve.  Ria Bush, MD

## 2017-06-28 NOTE — Assessment & Plan Note (Addendum)
Post nasal drainage with nonspecific chest discomfort initial episode yesterday that has since resolved. Does not sound cardiac in nature. Overall benign exam today. Regardless, update EKG today. I think this was more sequelae from chronic rhinorrhea, post nasal drainage. I did recommend he restart claritin regularly, initially recommended INS but after noticing TCI allergy will prescribe trial of astelin nasal spray. Encouraged nasal saline irrigation. Continue daily omeprazole which helps control GERD better. F/u with PCP if not improving with treatment reviewed.  Denies significant nasal congestion.

## 2017-06-28 NOTE — Patient Instructions (Signed)
EKG overall ok today. Be more regular with claritin. Continue omeprazole. If ongoing symptoms please seek care or return to see Dr Derrel Nip.

## 2017-07-12 DIAGNOSIS — H43813 Vitreous degeneration, bilateral: Secondary | ICD-10-CM | POA: Diagnosis not present

## 2017-07-12 DIAGNOSIS — H2522 Age-related cataract, morgagnian type, left eye: Secondary | ICD-10-CM | POA: Diagnosis not present

## 2017-07-12 DIAGNOSIS — H353211 Exudative age-related macular degeneration, right eye, with active choroidal neovascularization: Secondary | ICD-10-CM | POA: Diagnosis not present

## 2017-07-12 DIAGNOSIS — H353223 Exudative age-related macular degeneration, left eye, with inactive scar: Secondary | ICD-10-CM | POA: Diagnosis not present

## 2017-07-21 ENCOUNTER — Telehealth: Payer: Self-pay | Admitting: Internal Medicine

## 2017-07-21 NOTE — Telephone Encounter (Signed)
Prolia re verification submitted to portal for [employer].

## 2017-09-04 DIAGNOSIS — H31091 Other chorioretinal scars, right eye: Secondary | ICD-10-CM | POA: Diagnosis not present

## 2017-09-04 DIAGNOSIS — H35431 Paving stone degeneration of retina, right eye: Secondary | ICD-10-CM | POA: Diagnosis not present

## 2017-09-04 DIAGNOSIS — H353211 Exudative age-related macular degeneration, right eye, with active choroidal neovascularization: Secondary | ICD-10-CM | POA: Diagnosis not present

## 2017-09-04 DIAGNOSIS — H353223 Exudative age-related macular degeneration, left eye, with inactive scar: Secondary | ICD-10-CM | POA: Diagnosis not present

## 2017-09-18 ENCOUNTER — Telehealth: Payer: Self-pay | Admitting: Internal Medicine

## 2017-09-18 NOTE — Telephone Encounter (Signed)
Copied from [place] [id number]. Topic: General - Other >> Sep 18, 2017  9:40 AM Darl Householder, RMA wrote: Reason for CRM: patient is requesting a call back concerning prolia injection scheduling, please return pt call

## 2017-09-18 NOTE — Telephone Encounter (Signed)
Left message for patient George Owens has been approved needs to be scheduled on or after 10/02/17 nurse visit.

## 2017-09-19 NOTE — Telephone Encounter (Signed)
Pt is returning call to [facility], only wants Juliann Pulse to call him back.  He did not want me to read the message and schedule. Please call cell at [phone]

## 2017-09-19 NOTE — Telephone Encounter (Signed)
Please advise 

## 2017-09-19 NOTE — Telephone Encounter (Signed)
Patient scheduled for injection.

## 2017-09-21 ENCOUNTER — Ambulatory Visit (INDEPENDENT_AMBULATORY_CARE_PROVIDER_SITE_OTHER): Payer: PPO | Admitting: *Deleted

## 2017-09-21 DIAGNOSIS — M818 Other osteoporosis without current pathological fracture: Secondary | ICD-10-CM

## 2017-09-21 MED ORDER — DENOSUMAB 60 MG/ML ~~LOC~~ SOLN
60.0000 mg | Freq: Once | SUBCUTANEOUS | Status: AC
  Administered 2017-09-21: 60 mg via SUBCUTANEOUS

## 2017-09-21 NOTE — Progress Notes (Signed)
Patient presented for B 12 injection to right deltoid, patient voiced no concerns nor showed any signs of distress during injection. 

## 2017-09-22 ENCOUNTER — Telehealth: Payer: Self-pay | Admitting: Internal Medicine

## 2017-09-22 NOTE — Telephone Encounter (Signed)
Typically B12 can be checked every 3 to 6 months. . If he has not been taking any b12,  He can start taking 1000 mcg daily and recheck in 3 months

## 2017-09-22 NOTE — Telephone Encounter (Signed)
Patient called about labs from 10/18 he had forgotten that PCP had mentioned his B 12 being low  And he did not know the amount of B 12 he should take orally advised patient PCP usually recommends a 1000 Mcg daily . Patient ask when should he have his b 12 level rechecked after taking B 12, in ln phone note dated 06/16/17 PCP wanted to recheck B 12 and Vit d periodically.

## 2017-09-25 NOTE — Telephone Encounter (Signed)
Patient aware and lab scheduled for 3 months

## 2017-11-01 DIAGNOSIS — H2522 Age-related cataract, morgagnian type, left eye: Secondary | ICD-10-CM | POA: Diagnosis not present

## 2017-11-01 DIAGNOSIS — H31091 Other chorioretinal scars, right eye: Secondary | ICD-10-CM | POA: Diagnosis not present

## 2017-11-01 DIAGNOSIS — H353211 Exudative age-related macular degeneration, right eye, with active choroidal neovascularization: Secondary | ICD-10-CM | POA: Diagnosis not present

## 2017-11-01 DIAGNOSIS — H353223 Exudative age-related macular degeneration, left eye, with inactive scar: Secondary | ICD-10-CM | POA: Diagnosis not present

## 2017-11-06 DIAGNOSIS — H2589 Other age-related cataract: Secondary | ICD-10-CM | POA: Diagnosis not present

## 2017-12-12 ENCOUNTER — Telehealth: Payer: Self-pay | Admitting: Radiology

## 2017-12-12 ENCOUNTER — Telehealth: Payer: Self-pay

## 2017-12-12 DIAGNOSIS — K5900 Constipation, unspecified: Secondary | ICD-10-CM

## 2017-12-12 DIAGNOSIS — E538 Deficiency of other specified B group vitamins: Secondary | ICD-10-CM

## 2017-12-12 DIAGNOSIS — E559 Vitamin D deficiency, unspecified: Secondary | ICD-10-CM

## 2017-12-12 DIAGNOSIS — E7800 Pure hypercholesterolemia, unspecified: Secondary | ICD-10-CM

## 2017-12-12 NOTE — Addendum Note (Signed)
Addended by: Crecencio Mc on: 12/12/2017 11:52 AM   Modules accepted: Orders

## 2017-12-12 NOTE — Telephone Encounter (Signed)
Lab reordered as future

## 2017-12-12 NOTE — Telephone Encounter (Signed)
Pt coming in for labs Friday, please place future orders. Thank you.  

## 2017-12-13 DIAGNOSIS — H2589 Other age-related cataract: Secondary | ICD-10-CM | POA: Diagnosis not present

## 2017-12-15 ENCOUNTER — Other Ambulatory Visit: Payer: PPO

## 2017-12-18 ENCOUNTER — Other Ambulatory Visit: Payer: Self-pay

## 2017-12-18 ENCOUNTER — Encounter: Payer: Self-pay | Admitting: *Deleted

## 2017-12-18 NOTE — Discharge Instructions (Signed)

## 2017-12-19 ENCOUNTER — Encounter: Payer: Self-pay | Admitting: Anesthesiology

## 2017-12-25 ENCOUNTER — Encounter: Payer: Self-pay | Admitting: Internal Medicine

## 2017-12-25 ENCOUNTER — Ambulatory Visit (INDEPENDENT_AMBULATORY_CARE_PROVIDER_SITE_OTHER): Payer: PPO | Admitting: Internal Medicine

## 2017-12-25 ENCOUNTER — Other Ambulatory Visit (INDEPENDENT_AMBULATORY_CARE_PROVIDER_SITE_OTHER): Payer: PPO

## 2017-12-25 ENCOUNTER — Other Ambulatory Visit: Payer: PPO

## 2017-12-25 VITALS — BP 104/66 | HR 82 | Temp 98.1°F | Resp 15 | Ht 76.0 in | Wt 191.0 lb

## 2017-12-25 DIAGNOSIS — E559 Vitamin D deficiency, unspecified: Secondary | ICD-10-CM | POA: Diagnosis not present

## 2017-12-25 DIAGNOSIS — Z8546 Personal history of malignant neoplasm of prostate: Secondary | ICD-10-CM | POA: Diagnosis not present

## 2017-12-25 DIAGNOSIS — R1310 Dysphagia, unspecified: Secondary | ICD-10-CM | POA: Diagnosis not present

## 2017-12-25 DIAGNOSIS — E7800 Pure hypercholesterolemia, unspecified: Secondary | ICD-10-CM | POA: Diagnosis not present

## 2017-12-25 DIAGNOSIS — K5900 Constipation, unspecified: Secondary | ICD-10-CM | POA: Diagnosis not present

## 2017-12-25 DIAGNOSIS — M818 Other osteoporosis without current pathological fracture: Secondary | ICD-10-CM

## 2017-12-25 DIAGNOSIS — E538 Deficiency of other specified B group vitamins: Secondary | ICD-10-CM

## 2017-12-25 DIAGNOSIS — R6883 Chills (without fever): Secondary | ICD-10-CM

## 2017-12-25 DIAGNOSIS — R6889 Other general symptoms and signs: Secondary | ICD-10-CM | POA: Diagnosis not present

## 2017-12-25 LAB — POCT URINALYSIS DIPSTICK
Bilirubin, UA: NEGATIVE
Blood, UA: NEGATIVE
Glucose, UA: NEGATIVE
Ketones, UA: NEGATIVE
Leukocytes, UA: NEGATIVE
Nitrite, UA: NEGATIVE
Protein, UA: NEGATIVE
Spec Grav, UA: 1.015 (ref 1.010–1.025)
Urobilinogen, UA: 0.2 E.U./dL
pH, UA: 6 (ref 5.0–8.0)

## 2017-12-25 MED ORDER — OMEPRAZOLE 40 MG PO CPDR: 40.0000 mg | DELAYED_RELEASE_CAPSULE | Freq: Every day | ORAL | 3 refills | Status: DC

## 2017-12-25 NOTE — Addendum Note (Signed)
Addended by: Arby Barrette on: 12/25/2017 08:05 AM   Modules accepted: Orders

## 2017-12-25 NOTE — Patient Instructions (Addendum)
Take the omeprazole  In the morning 30 minutes prior to eating  food or coffee    If no improvement  in a week,  Add a second omeprazole dose in late afternoon 30 minutes before dinner    Your chills may be coming from  Urinary tract infection .   Regardless of the results,  You may proceed with your cataract surgery tomorrow   I will have an answer in 2 days

## 2017-12-25 NOTE — Progress Notes (Signed)
Subjective:  Patient ID: George Owens, male    DOB: February 14, 1932  Age: 83 y.o. MRN: [id number]  CC: The primary encounter diagnosis was Chills (without fever). Diagnoses of Body temperature low, Odynophagia, and Other osteoporosis without current pathological fracture were also pertinent to this visit.  HPI George Owens presents for evaluation of several symptoms .  He has been having very mild throat pain that began about one week ago,  after having an episode of dysphagia  That occurred while swallowing a glucosamine/chondroiton supplement.  .   The following day he started having acid taste in mouth,   Mild.  Still present ,  Not getting worse.  Has been taking prilosec for years  But taking it at the wrong time .   Also started feeling chilled.  At home his body temp has been as low as 96  In the morning and at bedtime. He has checked it later on the mornnin and afternoon and it has been normal,  and he has brought his home thermometer today for calibration and it is correct.  He denies malaise, rigors , cough,  Sinus congestion,  Dyspnea,  post prandial cough, headaches,  Flank pain, suprapubic pain, change in urination and changes in bowel habits.  Has some symptoms of incomplete bladder emptying which are chronic   Receiving Prolia every 6 months ,  Last dose was in April  Having cataract surgery in the am; all vitamins were stopped by surgeon a week ago ,  But after the symptoms listed above started prior to discontinuation of supplements.  .    Outpatient Medications Prior to Visit  Medication Sig Dispense Refill  . Calcium Carb-Cholecalciferol (CALCIUM 1000 + D PO) Take 1 tablet by mouth daily.     . Cholecalciferol (VITAMIN D3) 400 UNITS CAPS Take 1 capsule by mouth daily.    . Denosumab (PROLIA [place]) Inject into the skin every 6 (six) months.    . Glucosamine-Chondroit-Vit C-Mn (GLUCOSAMINE CHONDR 1500 COMPLX) CAPS Take 1 capsule by mouth daily.     [name] loratadine (CLARITIN)  10 MG tablet Take 10 mg by mouth daily as needed.     . Multiple Vitamins-Minerals (PRESERVISION AREDS PO) Take 2 capsules by mouth daily.    . naproxen (NAPROSYN) 250 MG tablet Take 250 mg by mouth as needed.    . Ranibizumab (LUCENTIS IO) Inject into the eye.    . vitamin B-12 (CYANOCOBALAMIN) 1000 MCG tablet Take 1,000 mcg by mouth daily.    [name] omeprazole (PRILOSEC) 40 MG capsule Take 1 capsule (40 mg total) by mouth daily. 90 capsule 3  . azelastine (ASTELIN) 0.1 % nasal spray Place 1 spray into both nostrils 2 (two) times daily. Use in each nostril as directed. Stop if any nosebleeds (Patient not taking: Reported on 12/18/2017) 30 mL 1   No facility-administered medications prior to visit.     Review of Systems;  Patient denies headache, fevers, malaise, unintentional weight loss, skin rash, eye pain, sinus congestion and sinus pain, sore throat, dysphagia,  hemoptysis , cough, dyspnea, wheezing, chest pain, palpitations, orthopnea, edema, abdominal pain, nausea, melena, diarrhea, constipation, flank pain, dysuria, hematuria, urinary  Frequency, nocturia, numbness, tingling, seizures,  Focal weakness, Loss of consciousness,  Tremor, insomnia, depression, anxiety, and suicidal ideation.      Objective:  BP 104/66 (BP Location: Left Arm, Patient Position: Sitting, Cuff Size: Normal)   Pulse 82   Temp 98.1 F (36.7 C) (Oral)   Resp 15  Ht 6\' 4"  (1.93 m)   Wt 191 lb (86.6 kg)   SpO2 97%   BMI 23.25 kg/m   BP Readings from Last 3 Encounters:  12/26/17 122/74  12/25/17 104/66  06/28/17 120/68    Wt Readings from Last 3 Encounters:  12/26/17 187 lb (84.8 kg)  12/25/17 191 lb (86.6 kg)  06/28/17 194 lb (88 kg)    General appearance: alert, cooperative and appears stated age Ears: normal TM's and external ear canals both ears Throat: lips, mucosa, and tongue normal; teeth and gums normal Neck: no adenopathy, no carotid bruit, supple, symmetrical, trachea midline and thyroid not  enlarged, symmetric, no tenderness/mass/nodules Back: symmetric, no curvature. ROM normal. No CVA tenderness. Lungs: clear to auscultation bilaterally Heart: regular rate and rhythm, S1, S2 normal, no murmur, click, rub or gallop Abdomen: soft, non-tender; bowel sounds normal; no masses,  no organomegaly Pulses: 2+ and symmetric Skin: Skin color, texture, turgor normal. No rashes or lesions Lymph nodes: Cervical, supraclavicular, and axillary nodes normal.  No results found for: HGBA1C  Lab Results  Component Value Date   CREATININE 0.89 12/25/2017   CREATININE 1.01 03/31/2017   CREATININE 1.06 11/17/2016    Lab Results  Component Value Date   WBC 4.4 03/31/2017   HGB 14.5 03/31/2017   HCT 43.1 03/31/2017   PLT 157.0 03/31/2017   GLUCOSE 97 12/25/2017   CHOL 199 12/25/2017   TRIG 154 (H) 12/25/2017   HDL 41 12/25/2017   LDLDIRECT 129.0 03/31/2017   LDLCALC 131 (H) 12/25/2017   ALT 26 12/25/2017   AST 22 12/25/2017   NA 141 12/25/2017   K 4.0 12/25/2017   CL 104 12/25/2017   CREATININE 0.89 12/25/2017   BUN 18 12/25/2017   CO2 24 12/25/2017   TSH 3.92 12/25/2017   PSA 0.57 03/31/2017    Dg Bone Density  Result Date: 11/17/2015 EXAM: DUAL X-RAY ABSORPTIOMETRY (DXA) FOR BONE MINERAL DENSITY IMPRESSION: Dear Dr. Derrel Owens, Your patient George Owens completed a BMD test on 11/17/2015 using the [place] (analysis version: 14.10) manufactured by [employer]. The following summarizes the results of our evaluation. PATIENT BIOGRAPHICAL: Name: George, Owens Patient ID: [id number] Birth Date: Jun 01, 1932 Height: 65.0 in. Gender: Male Exam Date: 11/17/2015 Weight: 196.0 lbs. Indications: Advanced Age, Caucasian, Height Loss, History of Fracture (Adult), Prostate ca Fractures: Foot, Stress Fx Treatments: Calcium, Multi-Vitamin with calcium, omeprazole, Prolia, Vit D ASSESSMENT: The BMD measured at Femur Neck Left is 0.681 g/cm2 with a T-score of -3.0. The patient is  consider osteoporotic according to [facility]) criteria. L-2 was excluded due to degenerative changes. Site Region Measured Measured WHO Young Adult BMD Date       Age      Classification T-score AP Spine L1-L4 (L2) 11/17/2015 81.5 Normal -0.3 1.189 g/cm2 AP Spine L1-L4 (L2) 11/14/2013 79.5 Normal -0.7 1.136 g/cm2 DualFemur Neck Left 11/17/2015 81.5 Osteoporosis -3.0 0.681 g/cm2 DualFemur Neck Left 11/14/2013 79.5 Osteoporosis -3.2 0.654 g/cm2 World Health Organization [facility]) criteria for post-menopausal, Caucasian Women: Normal:       T-score at or above -1 SD Osteopenia:   T-score between -1 and -2.5 SD Osteoporosis: T-score at or below -2.5 SD RECOMMENDATIONS: [place] recommends that FDA-approved medical therapies be considered in postmenopausal women and men age 66 or older with a: 1. Hip or vertebral (clinical or morphometric) fracture. 2. T-score of < -2.5 at the spine or hip. 3. Ten-year fracture probability by FRAX of 3% or greater for  hip fracture or 20% or greater for major osteoporotic fracture. All treatment decisions require clinical judgment and consideration of individual patient factors, including patient preferences, co-morbidities, previous drug use, risk factors not captured in the FRAX model (e.g. falls, vitamin D deficiency, increased bone turnover, interval significant decline in bone density) and possible under - or over-estimation of fracture risk by FRAX. All patients should ensure an adequate intake of dietary calcium (1200 mg/d) and vitamin D (800 IU daily) unless contraindicated. FOLLOW-UP: People with diagnosed cases of osteoporosis or osteopenia should be regularly tested for bone mineral density. For patients eligible for Medicare, routine testing is allowed once every 2 years. The testing frequency can be increased to one year for patients who have rapidly progressing disease, or for those who are receiving medical therapy to restore bone  mass. I have reviewed this report, and agree with the above findings. [facility] Radiology Electronically Signed   By: David  Martinique M.D.   On: 11/17/2015 12:28    Assessment & Plan:   Problem List Items Addressed This Visit    Osteoporosis    Secondary to use of Lupron for prostate CA.  Managed with Prolia . Repeat DEXA in 2017 showed slight improvement in scores   Will repeat in 2019.  Last dose April 2019      Relevant Orders   DG Bone Density   Body temperature low    Noticed in early morning and late evening.  Normal during the day.  Has been occurring for the past week,  Per patient.  Will rule out UTI,  Followed by checking a morning cortisol if thyroid function normal and no UTI         Odynophagia    Mild, since minor choking episode one week ago.  Change PPI to early AM,  For better prevention of reflux.  No current dysphagia so swallow evaluation not urgent.        Other Visit Diagnoses    Chills (without fever)    -  Primary   Relevant Orders   POCT urinalysis dipstick (Completed)   Urine Microscopic Only (Completed)   Urine Culture    A total of 25 minutes of face to face time was spent with patient more than half of which was spent in counselling about the above mentioned conditions  and coordination of care   I have changed George Owens's omeprazole. I am also having him maintain his GLUCOSAMINE CHONDR 1500 COMPLX, loratadine, Calcium Carb-Cholecalciferol (CALCIUM 1000 + D PO), Vitamin D3, Multiple Vitamins-Minerals (PRESERVISION AREDS PO), naproxen, azelastine, Denosumab (PROLIA [place]), Ranibizumab (LUCENTIS IO), and vitamin B-12.  Meds ordered this encounter  Medications  . omeprazole (PRILOSEC) 40 MG capsule    Sig: Take 1 capsule (40 mg total) by mouth daily. In the morning 30 minutes  prior to eating    Dispense:  90 capsule    Refill:  3    Medications Discontinued During This Encounter  Medication Reason  . omeprazole (PRILOSEC) 40 MG capsule      Follow-up: No follow-ups on file.   Crecencio Mc, MD

## 2017-12-26 ENCOUNTER — Encounter
Admission: RE | Disposition: A | Discharge status: Home / Self Care | Payer: Self-pay | Source: Ambulatory Visit | Attending: Ophthalmology

## 2017-12-26 ENCOUNTER — Ambulatory Visit
Admission: RE | Admit: 2017-12-26 | Discharge: 2017-12-26 | Disposition: A | Discharge status: Home / Self Care | Payer: PPO | Source: Ambulatory Visit | Attending: Ophthalmology | Admitting: Ophthalmology

## 2017-12-26 ENCOUNTER — Ambulatory Visit: Payer: PPO | Admitting: Anesthesiology

## 2017-12-26 DIAGNOSIS — Z8546 Personal history of malignant neoplasm of prostate: Secondary | ICD-10-CM | POA: Diagnosis not present

## 2017-12-26 DIAGNOSIS — K219 Gastro-esophageal reflux disease without esophagitis: Secondary | ICD-10-CM | POA: Diagnosis not present

## 2017-12-26 DIAGNOSIS — R6889 Other general symptoms and signs: Secondary | ICD-10-CM | POA: Insufficient documentation

## 2017-12-26 DIAGNOSIS — R1310 Dysphagia, unspecified: Secondary | ICD-10-CM | POA: Insufficient documentation

## 2017-12-26 DIAGNOSIS — H25812 Combined forms of age-related cataract, left eye: Secondary | ICD-10-CM | POA: Diagnosis not present

## 2017-12-26 DIAGNOSIS — H2589 Other age-related cataract: Secondary | ICD-10-CM | POA: Insufficient documentation

## 2017-12-26 HISTORY — DX: Age-related osteoporosis without current pathological fracture: M81.0

## 2017-12-26 HISTORY — PX: CATARACT EXTRACTION W/PHACO: SHX586

## 2017-12-26 HISTORY — DX: Gastro-esophageal reflux disease without esophagitis: K21.9

## 2017-12-26 LAB — URINALYSIS, MICROSCOPIC ONLY
RBC / HPF: NONE SEEN (ref 0–?)
WBC, UA: NONE SEEN (ref 0–?)

## 2017-12-26 SURGERY — PHACOEMULSIFICATION, CATARACT, WITH IOL INSERTION
Anesthesia: Monitor Anesthesia Care | Site: Eye | Laterality: Left | Wound class: "Clean "

## 2017-12-26 MED ORDER — TETRACAINE HCL 0.5 % OP SOLN
1.0000 [drp] | OPHTHALMIC | Status: DC | PRN
  Administered 2017-12-26 (×2): 1 [drp] via OPHTHALMIC

## 2017-12-26 MED ORDER — BRIMONIDINE TARTRATE-TIMOLOL 0.2-0.5 % OP SOLN
OPHTHALMIC | Status: DC | PRN
  Administered 2017-12-26: 1 [drp] via OPHTHALMIC

## 2017-12-26 MED ORDER — BALANCED SALT IO SOLN
INTRAOCULAR | Status: DC | PRN
  Administered 2017-12-26: .5 mL via INTRAMUSCULAR

## 2017-12-26 MED ORDER — EPINEPHRINE PF 1 MG/ML IJ SOLN
INTRAOCULAR | Status: DC | PRN
  Administered 2017-12-26: 86 mL via OPHTHALMIC

## 2017-12-26 MED ORDER — TRYPAN BLUE 0.06 % OP SOLN
OPHTHALMIC | Status: DC | PRN
  Administered 2017-12-26: .25 mL via INTRAOCULAR

## 2017-12-26 MED ORDER — SODIUM HYALURONATE 23 MG/ML IO SOLN
INTRAOCULAR | Status: DC | PRN
  Administered 2017-12-26: 0.6 mL via INTRAOCULAR

## 2017-12-26 MED ORDER — FENTANYL CITRATE (PF) 100 MCG/2ML IJ SOLN
INTRAMUSCULAR | Status: DC | PRN
  Administered 2017-12-26: 50 ug via INTRAVENOUS

## 2017-12-26 MED ORDER — NA HYALUR & NA CHOND-NA HYALUR 0.4-0.35 ML IO KIT
PACK | INTRAOCULAR | Status: DC | PRN
  Administered 2017-12-26: .5 mL via INTRAOCULAR

## 2017-12-26 MED ORDER — PHENYLEPHRINE HCL 10 % OP SOLN
1.0000 [drp] | OPHTHALMIC | Status: DC | PRN
  Administered 2017-12-26 (×3): 1 [drp] via OPHTHALMIC

## 2017-12-26 MED ORDER — MOXIFLOXACIN HCL 0.5 % OP SOLN
1.0000 [drp] | OPHTHALMIC | Status: DC | PRN
  Administered 2017-12-26 (×3): 1 [drp] via OPHTHALMIC

## 2017-12-26 MED ORDER — MIDAZOLAM HCL 2 MG/2ML IJ SOLN
INTRAMUSCULAR | Status: DC | PRN
  Administered 2017-12-26: 2 mg via INTRAVENOUS

## 2017-12-26 MED ORDER — LACTATED RINGERS IV SOLN: 1000.0000 mL | INTRAVENOUS | Status: DC

## 2017-12-26 MED ORDER — CEFUROXIME OPHTHALMIC INJECTION 1 MG/0.1 ML
INJECTION | OPHTHALMIC | Status: DC | PRN
  Administered 2017-12-26: .3 mL via OPHTHALMIC

## 2017-12-26 MED ORDER — CYCLOPENTOLATE HCL 2 % OP SOLN
1.0000 [drp] | OPHTHALMIC | Status: DC | PRN
  Administered 2017-12-26 (×4): 1 [drp] via OPHTHALMIC

## 2017-12-26 SURGICAL SUPPLY — 27 items

## 2017-12-26 NOTE — Transfer of Care (Signed)
Immediate Anesthesia Transfer of Care Note  Patient: George Owens  Procedure(s) Performed: CATARACT EXTRACTION PHACO AND INTRAOCULAR LENS PLACEMENT (IOC)  COMPLICATED LEFT (Left Eye)  Patient Location: PACU  Anesthesia Type: MAC  Level of Consciousness: awake, alert  and patient cooperative  Airway and Oxygen Therapy: Patient Spontanous Breathing and Patient connected to supplemental oxygen  Post-op Assessment: Post-op Vital signs reviewed, Patient's Cardiovascular Status Stable, Respiratory Function Stable, Patent Airway and No signs of Nausea or vomiting  Post-op Vital Signs: Reviewed and stable  Complications: No apparent anesthesia complications

## 2017-12-26 NOTE — Assessment & Plan Note (Signed)
Mild, since minor choking episode one week ago.  Change PPI to early AM,  For better prevention of reflux.  No current dysphagia so swallow evaluation not urgent.

## 2017-12-26 NOTE — H&P (Signed)
The History and Physical notes are on paper, have been signed, and are to be scanned. The patient remains stable and unchanged from the H&P.   Previous H&P reviewed, patient examined, and there are no changes.  George Owens 12/26/2017 7:49 AM

## 2017-12-26 NOTE — Anesthesia Postprocedure Evaluation (Signed)
Anesthesia Post Note  Patient: George Owens  Procedure(s) Performed: CATARACT EXTRACTION PHACO AND INTRAOCULAR LENS PLACEMENT (IOC)  COMPLICATED LEFT (Left Eye)  Patient location during evaluation: PACU Anesthesia Type: MAC Level of consciousness: awake Pain management: pain level controlled Vital Signs Assessment: post-procedure vital signs reviewed and stable Respiratory status: spontaneous breathing Cardiovascular status: blood pressure returned to baseline Postop Assessment: no headache Anesthetic complications: no    Lavonna Monarch

## 2017-12-26 NOTE — Assessment & Plan Note (Signed)
Noticed in early morning and late evening.  Normal during the day.  Has been occurring for the past week,  Per patient.  Will rule out UTI,  Followed by checking a morning cortisol if thyroid function normal and no UTI

## 2017-12-26 NOTE — Anesthesia Procedure Notes (Signed)
Procedure Name: MAC Date/Time: 12/26/2017 7:56 AM Performed by: Janna Arch, CRNA Pre-anesthesia Checklist: Patient identified, Emergency Drugs available, Suction available and Patient being monitored Patient Re-evaluated:Patient Re-evaluated prior to induction Oxygen Delivery Method: Nasal cannula

## 2017-12-26 NOTE — Anesthesia Preprocedure Evaluation (Signed)
Anesthesia Evaluation  Patient identified by MRN, date of birth, ID band Patient awake    Reviewed: Allergy & Precautions, NPO status , Patient's Chart, lab work & pertinent test results, reviewed documented beta blocker date and time   Airway Mallampati: II  TM Distance: >3 FB Neck ROM: Full    Dental no notable dental hx.    Pulmonary    Pulmonary exam normal breath sounds clear to auscultation       Cardiovascular negative cardio ROS Normal cardiovascular exam Rhythm:Regular Rate:Normal     Neuro/Psych  Headaches, negative psych ROS   GI/Hepatic Neg liver ROS, GERD  ,  Endo/Other  negative endocrine ROS  Renal/GU negative Renal ROS  negative genitourinary   Musculoskeletal  (+) Arthritis ,   Abdominal Normal abdominal exam  (+)   Peds  Hematology negative hematology ROS (+)   Anesthesia Other Findings   Reproductive/Obstetrics                             Anesthesia Physical Anesthesia Plan  ASA: II  Anesthesia Plan: MAC   Post-op Pain Management:    Induction: Intravenous  PONV Risk Score and Plan:   Airway Management Planned: Natural Airway  Additional Equipment: None  Intra-op Plan:   Post-operative Plan:   Informed Consent: I have reviewed the patients History and Physical, chart, labs and discussed the procedure including the risks, benefits and alternatives for the proposed anesthesia with the patient or authorized representative who has indicated his/her understanding and acceptance.     Plan Discussed with: CRNA, Anesthesiologist and Surgeon  Anesthesia Plan Comments:         Anesthesia Quick Evaluation

## 2017-12-26 NOTE — Assessment & Plan Note (Signed)
Secondary to use of Lupron for prostate CA.  Managed with Prolia . Repeat DEXA in 2017 showed slight improvement in scores   Will repeat in 2019.  Last dose April 2019

## 2017-12-26 NOTE — Op Note (Signed)
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  Vision Blue dye was used to stain the lens capsule.  LENS:   Implant Name Type Inv. Item Serial No. Manufacturer Lot No. LRB No. Used  LENS IOL DIOP 19.5 - [id number] Intraocular Lens LENS IOL DIOP 19.5 [id number]  Left 1       ULTRASOUND TIME: 30 of 1 minutes 53 seconds, CDE 33.5  SURGEON:  Wyonia Hough, MD   ANESTHESIA:  Topical with tetracaine drops and 2% Xylocaine jelly, augmented with 1% preservative-free intracameral lidocaine.   COMPLICATIONS:  None.   DESCRIPTION OF PROCEDURE:  The patient was identified in the holding room and transported to the operating room and placed in the supine position under the operating microscope. Theleft eye was identified as the operative eye and it was prepped and draped in the usual sterile ophthalmic fashion.  A 1 millimeter clear-corneal paracentesis was made at the 1:30 position.  0.5 ml of preservative-free 1% lidocaine was injected into the anterior chamber. The anterior chamber was filled with Healon 5 viscoelastic.  A 2.4 millimeter keratome was used to make a near-clear corneal incision at the 10:30 position.  The anterior chamber was filled with Healon 5 viscoelastic.  Vision Blue dye was then injected under the viscoelastic to stain the lens capsule.  BSS was then used to wash the dye out.  Additional Healon 5 was placed into the anterior chamber.  A curvilinear capsulorrhexis was made with a cystotome and capsulorrhexis forceps.  Balanced salt solution was used to hydrodissect and hydrodelineate the nucleus.  Viscoat was then placed in the anterior chamber.   Phacoemulsification was then used in stop and chop fashion to  remove the lens nucleus and epinucleus.  The remaining cortex was then removed using the irrigation and aspiration handpiece. Provisc was then placed into the capsular bag to distend it for lens placement.  A 19.5 -diopter lens was then injected into the capsular bag.  The remaining viscoelastic was aspirated.   Wounds were hydrated with balanced salt solution.  The anterior chamber was inflated to a physiologic pressure with balanced salt solution. Cefuroxime 0.1 ml of a 10mg /ml solution was injected into the anterior chamber for a dose of 1 mg of intracameral antibiotic at the completion of the case. Miostat was placed into the anterior chamber to constrict the pupil.  No wound leaks were noted.  Topical Vigamox drops and Maxitrol ointment were applied to the eye.  The patient was taken to the recovery room in stable condition without complications of anesthesia or surgery.  Angelica Frandsen 12/26/2017, 8:15 AM

## 2017-12-27 ENCOUNTER — Encounter: Payer: Self-pay | Admitting: Ophthalmology

## 2017-12-27 LAB — URINE CULTURE
MICRO NUMBER:: 90811156
Result:: NO GROWTH
SPECIMEN QUALITY:: ADEQUATE

## 2017-12-28 LAB — COMPREHENSIVE METABOLIC PANEL
AG Ratio: 1.8 (calc) (ref 1.0–2.5)
ALT: 26 U/L (ref 9–46)
AST: 22 U/L (ref 10–35)
Albumin: 4.1 g/dL (ref 3.6–5.1)
Alkaline phosphatase (APISO): 44 U/L (ref 40–115)
BUN: 18 mg/dL (ref 7–25)
CO2: 24 mmol/L (ref 20–32)
Calcium: 9 mg/dL (ref 8.6–10.3)
Chloride: 104 mmol/L (ref 98–110)
Creat: 0.89 mg/dL (ref 0.70–1.11)
Globulin: 2.3 g/dL (calc) (ref 1.9–3.7)
Glucose, Bld: 97 mg/dL (ref 65–99)
Potassium: 4 mmol/L (ref 3.5–5.3)
Sodium: 141 mmol/L (ref 135–146)
Total Bilirubin: 0.4 mg/dL (ref 0.2–1.2)
Total Protein: 6.4 g/dL (ref 6.1–8.1)

## 2017-12-28 LAB — VITAMIN D 25 HYDROXY (VIT D DEFICIENCY, FRACTURES): Vit D, 25-Hydroxy: 39 ng/mL (ref 30–100)

## 2017-12-28 LAB — LIPID PANEL
Cholesterol: 199 mg/dL (ref <200)
HDL: 41 mg/dL (ref >40)
LDL Cholesterol (Calc): 131 mg/dL (calc) — ABNORMAL HIGH
Non-HDL Cholesterol (Calc): 158 mg/dL (calc) — ABNORMAL HIGH (ref <130)
Total CHOL/HDL Ratio: 4.9 (calc) (ref <5.0)
Triglycerides: 154 mg/dL — ABNORMAL HIGH (ref <150)

## 2017-12-28 LAB — TSH: TSH: 3.92 mIU/L (ref 0.40–4.50)

## 2017-12-28 LAB — METHYLMALONIC ACID, SERUM: Methylmalonic Acid, Quant: 251 nmol/L (ref 87–318)

## 2018-01-09 DIAGNOSIS — D2272 Melanocytic nevi of left lower limb, including hip: Secondary | ICD-10-CM | POA: Diagnosis not present

## 2018-01-09 DIAGNOSIS — Z85828 Personal history of other malignant neoplasm of skin: Secondary | ICD-10-CM | POA: Diagnosis not present

## 2018-01-09 DIAGNOSIS — D2262 Melanocytic nevi of left upper limb, including shoulder: Secondary | ICD-10-CM | POA: Diagnosis not present

## 2018-01-09 DIAGNOSIS — D485 Neoplasm of uncertain behavior of skin: Secondary | ICD-10-CM | POA: Diagnosis not present

## 2018-01-09 DIAGNOSIS — D225 Melanocytic nevi of trunk: Secondary | ICD-10-CM | POA: Diagnosis not present

## 2018-01-09 DIAGNOSIS — L910 Hypertrophic scar: Secondary | ICD-10-CM | POA: Diagnosis not present

## 2018-01-09 DIAGNOSIS — Z08 Encounter for follow-up examination after completed treatment for malignant neoplasm: Secondary | ICD-10-CM | POA: Diagnosis not present

## 2018-01-09 DIAGNOSIS — D2271 Melanocytic nevi of right lower limb, including hip: Secondary | ICD-10-CM | POA: Diagnosis not present

## 2018-01-09 DIAGNOSIS — L821 Other seborrheic keratosis: Secondary | ICD-10-CM | POA: Diagnosis not present

## 2018-01-09 DIAGNOSIS — D0439 Carcinoma in situ of skin of other parts of face: Secondary | ICD-10-CM | POA: Diagnosis not present

## 2018-01-09 DIAGNOSIS — D2261 Melanocytic nevi of right upper limb, including shoulder: Secondary | ICD-10-CM | POA: Diagnosis not present

## 2018-01-10 DIAGNOSIS — H353223 Exudative age-related macular degeneration, left eye, with inactive scar: Secondary | ICD-10-CM | POA: Diagnosis not present

## 2018-01-10 DIAGNOSIS — H353211 Exudative age-related macular degeneration, right eye, with active choroidal neovascularization: Secondary | ICD-10-CM | POA: Diagnosis not present

## 2018-01-10 DIAGNOSIS — H35431 Paving stone degeneration of retina, right eye: Secondary | ICD-10-CM | POA: Diagnosis not present

## 2018-01-10 DIAGNOSIS — H31091 Other chorioretinal scars, right eye: Secondary | ICD-10-CM | POA: Diagnosis not present

## 2018-02-12 ENCOUNTER — Telehealth: Payer: Self-pay | Admitting: *Deleted

## 2018-02-12 NOTE — Telephone Encounter (Signed)
Copied from [place] [phone]. Topic: Medicare AWV >> Feb 12, 2018  9:48 AM Burchel, Abbi R wrote: Pt would like to reschedule his AWV for sooner.  He has some specific concerns he would like to discuss.  Please call pt after 3pm today, or tomorrow if possible.   Pt: [phone]

## 2018-02-15 ENCOUNTER — Telehealth: Payer: Self-pay | Admitting: Internal Medicine

## 2018-02-15 ENCOUNTER — Telehealth: Payer: Self-pay

## 2018-02-15 MED ORDER — ZOSTER VAC RECOMB ADJUVANTED 50 MCG/0.5ML IM SUSR: 0.5000 mL | Freq: Once | INTRAMUSCULAR | 1 refills | Status: AC

## 2018-02-15 NOTE — Telephone Encounter (Signed)
Copied from [place] [phone]. Topic: Quick Communication - See Telephone Encounter >> Feb 15, 2018  4:14 PM Ivar Drape wrote: CRM for notification. See Telephone encounter for: 02/15/18. Patient stated Dr. Derrel Nip told him to double his omeprazole (PRILOSEC) 40 MG capsule medication but she didn't write or send in another prescription to cover the extra tablets he would be taking.  Please send in a prescription to his preferred pharmacy [facility] on [address] in [place].

## 2018-02-15 NOTE — Telephone Encounter (Signed)
I cannot find in PCP where to increase omeprazole to 40 mg BId or I would send please advise ok to send.

## 2018-02-15 NOTE — Telephone Encounter (Signed)
Copied from [place] [phone]. Topic: General - Other >> Feb 15, 2018  4:19 PM Ivar Drape wrote: Reason for CRM:   Patient would like to know if he needs a bone density test before he takes his next Prolia Injection.  Please advise.

## 2018-02-15 NOTE — Telephone Encounter (Signed)
Prescribed and hopefully printed

## 2018-02-15 NOTE — Telephone Encounter (Signed)
Copied from [place] [phone]. Topic: Quick Communication - See Telephone Encounter >> Feb 15, 2018  4:17 PM Ivar Drape wrote: CRM for notification. See Telephone encounter for: 02/15/18. Patient would like a prescription for a Shingle Injection.

## 2018-02-15 NOTE — Telephone Encounter (Signed)
Copied from [place] [phone]. Topic: Medicare AWV >> Feb 12, 2018  9:48 AM Burchel, Abbi R wrote: Pt would like to reschedule his AWV for sooner.  He has some specific concerns he would like to discuss.  Please call pt after 3pm today, or tomorrow if possible.   Pt: [phone] >> Feb 15, 2018  4:21 PM George Owens wrote: Patient was wanting an earlier appt but it was explained to him that 04/04/18 was the earliest he could have a Medicare Wellness Visit.  So he is going to keep that appt.

## 2018-02-16 ENCOUNTER — Other Ambulatory Visit: Payer: Self-pay | Admitting: Internal Medicine

## 2018-02-16 DIAGNOSIS — M818 Other osteoporosis without current pathological fracture: Secondary | ICD-10-CM

## 2018-02-16 NOTE — Telephone Encounter (Signed)
No, the only thing he needed piror to  His injection is a  calcium level , and this was done in July and normal   However,  He should get one this year.  And I will order it. Last one was 2017

## 2018-02-16 NOTE — Telephone Encounter (Signed)
Does pt need a bone density scan before his next prolia injection?

## 2018-02-16 NOTE — Telephone Encounter (Signed)
rx request 

## 2018-02-16 NOTE — Telephone Encounter (Signed)
rx was faxed.

## 2018-02-20 ENCOUNTER — Encounter: Payer: Self-pay | Admitting: Internal Medicine

## 2018-02-22 MED ORDER — OMEPRAZOLE 40 MG PO CPDR: 40.0000 mg | DELAYED_RELEASE_CAPSULE | Freq: Two times a day (BID) | ORAL | 1 refills | Status: DC

## 2018-02-22 NOTE — Telephone Encounter (Signed)
LMTCB. PEC may speak with pt.  

## 2018-02-22 NOTE — Telephone Encounter (Signed)
Pt called today and wanted an appt for Shingles vaccine. Called George Owens and pt scheduled for 03/01/18 at 3:30 pm.

## 2018-02-22 NOTE — Telephone Encounter (Signed)
Based off of last office note pt was told that if the omperazole 40mg  once daily in the morning then he could add another one in the evening. According to that I have sent in a new rx stating twice daily.

## 2018-02-27 ENCOUNTER — Other Ambulatory Visit: Payer: PPO

## 2018-03-01 ENCOUNTER — Ambulatory Visit: Payer: PPO

## 2018-03-14 ENCOUNTER — Other Ambulatory Visit: Payer: PPO

## 2018-03-16 DIAGNOSIS — H01003 Unspecified blepharitis right eye, unspecified eyelid: Secondary | ICD-10-CM | POA: Diagnosis not present

## 2018-03-19 NOTE — Telephone Encounter (Signed)
LMTCB. Please transfer pt to our office.  

## 2018-03-19 NOTE — Telephone Encounter (Signed)
Spoke with pt and he stated that he is coming in on 04/04/2018 for his AWV appointment.

## 2018-03-19 NOTE — Telephone Encounter (Signed)
Spoke with pt and he stated that he has a bone density scan scheduled for Wednesday.

## 2018-03-21 ENCOUNTER — Ambulatory Visit
Admission: RE | Admit: 2018-03-21 | Discharge: 2018-03-21 | Disposition: A | Discharge status: Home / Self Care | Payer: PPO | Source: Ambulatory Visit | Attending: Internal Medicine | Admitting: Internal Medicine

## 2018-03-21 DIAGNOSIS — M818 Other osteoporosis without current pathological fracture: Secondary | ICD-10-CM | POA: Diagnosis not present

## 2018-03-21 DIAGNOSIS — M810 Age-related osteoporosis without current pathological fracture: Secondary | ICD-10-CM | POA: Diagnosis not present

## 2018-03-21 DIAGNOSIS — M85831 Other specified disorders of bone density and structure, right forearm: Secondary | ICD-10-CM | POA: Diagnosis not present

## 2018-04-04 ENCOUNTER — Ambulatory Visit (INDEPENDENT_AMBULATORY_CARE_PROVIDER_SITE_OTHER): Payer: PPO | Admitting: Internal Medicine

## 2018-04-04 ENCOUNTER — Encounter: Payer: Self-pay | Admitting: Internal Medicine

## 2018-04-04 ENCOUNTER — Ambulatory Visit (INDEPENDENT_AMBULATORY_CARE_PROVIDER_SITE_OTHER): Payer: PPO

## 2018-04-04 VITALS — BP 126/76 | HR 66 | Temp 97.9°F | Resp 15 | Ht 76.0 in | Wt 189.8 lb

## 2018-04-04 DIAGNOSIS — E7800 Pure hypercholesterolemia, unspecified: Secondary | ICD-10-CM | POA: Diagnosis not present

## 2018-04-04 DIAGNOSIS — R6889 Other general symptoms and signs: Secondary | ICD-10-CM | POA: Diagnosis not present

## 2018-04-04 DIAGNOSIS — M818 Other osteoporosis without current pathological fracture: Secondary | ICD-10-CM | POA: Diagnosis not present

## 2018-04-04 DIAGNOSIS — Z0000 Encounter for general adult medical examination without abnormal findings: Secondary | ICD-10-CM | POA: Diagnosis not present

## 2018-04-04 DIAGNOSIS — K219 Gastro-esophageal reflux disease without esophagitis: Secondary | ICD-10-CM

## 2018-04-04 DIAGNOSIS — R6883 Chills (without fever): Secondary | ICD-10-CM | POA: Diagnosis not present

## 2018-04-04 DIAGNOSIS — Z8546 Personal history of malignant neoplasm of prostate: Secondary | ICD-10-CM

## 2018-04-04 DIAGNOSIS — R252 Cramp and spasm: Secondary | ICD-10-CM

## 2018-04-04 LAB — LIPID PANEL
Cholesterol: 207 mg/dL — ABNORMAL HIGH (ref 0–200)
HDL: 41.3 mg/dL (ref >39.00)
LDL Cholesterol: 137 mg/dL — ABNORMAL HIGH (ref 0–99)
NonHDL: 165.59
Total CHOL/HDL Ratio: 5
Triglycerides: 142 mg/dL (ref 0.0–149.0)
VLDL: 28.4 mg/dL (ref 0.0–40.0)

## 2018-04-04 LAB — CBC WITH DIFFERENTIAL/PLATELET
Basophils Absolute: 0 10*3/uL (ref 0.0–0.1)
Basophils Relative: 0.7 % (ref 0.0–3.0)
Eosinophils Absolute: 0.1 10*3/uL (ref 0.0–0.7)
Eosinophils Relative: 1.9 % (ref 0.0–5.0)
HCT: 43.2 % (ref 39.0–52.0)
Hemoglobin: 14.5 g/dL (ref 13.0–17.0)
Lymphocytes Relative: 31 % (ref 12.0–46.0)
Lymphs Abs: 1.2 10*3/uL (ref 0.7–4.0)
MCHC: 33.7 g/dL (ref 30.0–36.0)
MCV: 91.1 fl (ref 78.0–100.0)
Monocytes Absolute: 0.4 10*3/uL (ref 0.1–1.0)
Monocytes Relative: 8.9 % (ref 3.0–12.0)
Neutro Abs: 2.3 10*3/uL (ref 1.4–7.7)
Neutrophils Relative %: 57.5 % (ref 43.0–77.0)
Platelets: 166 10*3/uL (ref 150.0–400.0)
RBC: 4.75 Mil/uL (ref 4.22–5.81)
RDW: 13.3 % (ref 11.5–15.5)
WBC: 4 10*3/uL (ref 4.0–10.5)

## 2018-04-04 LAB — PSA, MEDICARE: PSA: 0.54 ng/ml (ref 0.10–4.00)

## 2018-04-04 LAB — COMPREHENSIVE METABOLIC PANEL
ALT: 21 U/L (ref 0–53)
AST: 21 U/L (ref 0–37)
Albumin: 4.3 g/dL (ref 3.5–5.2)
Alkaline Phosphatase: 47 U/L (ref 39–117)
BUN: 19 mg/dL (ref 6–23)
CO2: 33 mEq/L — ABNORMAL HIGH (ref 19–32)
Calcium: 9.4 mg/dL (ref 8.4–10.5)
Chloride: 100 mEq/L (ref 96–112)
Creatinine, Ser: 0.93 mg/dL (ref 0.40–1.50)
GFR: 82.29 mL/min (ref >60.00)
Glucose, Bld: 103 mg/dL — ABNORMAL HIGH (ref 70–99)
Potassium: 4 mEq/L (ref 3.5–5.1)
Sodium: 140 mEq/L (ref 135–145)
Total Bilirubin: 0.7 mg/dL (ref 0.2–1.2)
Total Protein: 6.7 g/dL (ref 6.0–8.3)

## 2018-04-04 LAB — TSH: TSH: 3.4 u[IU]/mL (ref 0.35–4.50)

## 2018-04-04 MED ORDER — DENOSUMAB 60 MG/ML ~~LOC~~ SOSY
60.0000 mg | PREFILLED_SYRINGE | Freq: Once | SUBCUTANEOUS | Status: AC
  Administered 2018-04-04: 60 mg via SUBCUTANEOUS

## 2018-04-04 MED ORDER — ZOSTER VAC RECOMB ADJUVANTED 50 MCG/0.5ML IM SUSR: 0.5000 mL | Freq: Once | INTRAMUSCULAR | 1 refills | Status: AC

## 2018-04-04 NOTE — Progress Notes (Addendum)
Subjective:   George Owens is a 84 y.o. male who presents for Medicare Annual/Subsequent preventive examination.  Review of Systems:  No ROS.  Medicare Wellness Visit. Additional risk factors are reflected in the social history. Cardiac Risk Factors include: advanced age (>36men, >51 women);male gender     Objective:    Vitals: BP 126/76   Pulse 66   Temp 97.9 F (36.6 C) (Oral)   Resp 15   Ht 6\' 4"  (1.93 m)   Wt 189 lb 12.8 oz (86.1 kg)   SpO2 99%   BMI 23.10 kg/m   Body mass index is 23.1 kg/m.  Advanced Directives 04/04/2018 12/26/2017 04/03/2017 03/23/2016  Does Patient Have a Medical Advance Directive? Yes Yes Yes Yes  Type of Industrial/product designer of [employer] Power of [place]  Does patient want to make changes to medical advance directive? No - Patient declined No - Patient declined No - Patient declined -  Copy of [place] in Chart? No - copy requested Yes No - copy requested No - copy requested    Tobacco Social History   Tobacco Use  Smoking Status Never Smoker  Smokeless Tobacco Never Used     Counseling given: Not Answered   Clinical Intake:  Pre-visit preparation completed: Yes  Pain : No/denies pain     Nutritional Status: BMI of 19-24  Normal Diabetes: No  How often do you need to have someone help you when you read instructions, pamphlets, or other written materials from your doctor or pharmacy?: 1 - Never  Interpreter Needed?: No     Past Medical History:  Diagnosis Date  . Allergy   . Chicken pox   . Colon polyps   . GERD (gastroesophageal reflux disease)   . history of prostate CA   . Hyperlipidemia   . Leg cramps   . Neuropathy   . Osteoporosis    Past Surgical History:  Procedure Laterality Date  . CATARACT EXTRACTION W/PHACO Left 12/26/2017   Procedure: CATARACT EXTRACTION PHACO AND INTRAOCULAR LENS PLACEMENT ([place])   COMPLICATED LEFT;  Surgeon: Leandrew Koyanagi, MD;  Location: [place];  Service: Ophthalmology;  Laterality: Left;  [place]  . EYE SURGERY    . TONSILECTOMY, ADENOIDECTOMY, BILATERAL MYRINGOTOMY AND TUBES     Family History  Problem Relation Age of Onset  . Stroke Mother   . Arthritis Father   . Heart disease Father    Social History   Socioeconomic History  . Marital status: Married    Spouse name: Not on file  . Number of children: Not on file  . Years of education: Not on file  . Highest education level: Not on file  Occupational History  . Not on file  Social Needs  . Financial resource strain: Not on file  . Food insecurity:    Worry: Not on file    Inability: Not on file  . Transportation needs:    Medical: Not on file    Non-medical: Not on file  Tobacco Use  . Smoking status: Never Smoker  . Smokeless tobacco: Never Used  Substance and Sexual Activity  . Alcohol use: No  . Drug use: No  . Sexual activity: Yes    Partners: Female  Lifestyle  . Physical activity:    Days per week: Not on file    Minutes per session: Not on file  . Stress: Not on file  Relationships  . Social connections:    Talks on phone: Not on file    Gets together: Not on file    Attends religious service: Not on file    Active member of club or organization: Not on file    Attends meetings of clubs or organizations: Not on file    Relationship status: Not on file  Other Topics Concern  . Not on file  Social History Narrative   Lives with wife in a 5 story retirement home.  He lives on the first floor.  4 year college degree.  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      Outpatient Encounter Medications as of 04/04/2018  Medication Sig  . Calcium Carb-Cholecalciferol (CALCIUM 1000 + D PO) Take 1 tablet by mouth daily.   . Cholecalciferol (VITAMIN D3) 400 UNITS CAPS Take 1 capsule by mouth daily.  . Denosumab (PROLIA [place]) Inject  into the skin every 6 (six) months.  . Glucosamine-Chondroit-Vit C-Mn (GLUCOSAMINE CHONDR 1500 COMPLX) CAPS Take 1 capsule by mouth daily.   [name] loratadine (CLARITIN) 10 MG tablet Take 10 mg by mouth daily as needed.   . Multiple Vitamins-Minerals (PRESERVISION AREDS PO) Take 2 capsules by mouth daily.  . naproxen (NAPROSYN) 250 MG tablet Take 250 mg by mouth as needed.  . Ranibizumab (LUCENTIS IO) Inject into the eye.  . vitamin B-12 (CYANOCOBALAMIN) 1000 MCG tablet Take 1,000 mcg by mouth daily.   No facility-administered encounter medications on file as of 04/04/2018.     Activities of Daily Living In your present state of health, do you have any difficulty performing the following activities: 04/04/2018 12/26/2017  Hearing? N N  Vision? N N  Difficulty concentrating or making decisions? N N  Walking or climbing stairs? N N  Dressing or bathing? N N  Doing errands, shopping? Y -  Comment He does not drive Facilities manager and eating ? N -  Using the Toilet? N -  In the past six months, have you accidently leaked urine? N -  Do you have problems with loss of bowel control? N -  Managing your Medications? N -  Managing your Finances? N -  Housekeeping or managing your Housekeeping? N -  Some recent data might be hidden    Patient Care Team: Crecencio Mc, MD as PCP - General (Internal Medicine)   Assessment:   This is a routine wellness examination for George Owens.  The goal of the wellness visit is to assist the patient how to close the gaps in care and create a preventative care plan for the patient.   The roster of all physicians providing medical care to patient is listed in the Snapshot section of the chart.  Labs completed.   Taking calcium VIT D as appropriate/Osteoporosis  reviewed.  Prolia injections currently administered every 6 months.  Safety issues reviewed; Smoke and carbon monoxide detectors in the home. No firearms in the home. Wears seatbelts when riding with  others. No violence in the home.  They do not have excessive sun exposure.  Discussed the need for sun protection: hats, long sleeves and the use of sunscreen if there is significant sun exposure.  Patient is alert, normal appearance, oriented to person/place/and time.  Correctly identified the president of the [place] and recalls of 3/3 words. Performs simple calculations and can read correct time from watch face.  Displays appropriate judgement.  No new identified risk were noted.   No  failures of ADLs.    BMI- discussed the importance of a healthy diet, water intake and the benefits of aerobic exercise. Educational material provided.   24 hour diet recall: Regular diet  Dental- dentures.   Sleep patterns- Sleeps about 7 hours.   Health maintenance gaps- closed.  Exercise Activities and Dietary recommendations Current Exercise Habits: Home exercise routine, Type of exercise: calisthenics(swimming, water volleyball, aerobics), Time (Minutes): 45, Frequency (Times/Week): 5, Weekly Exercise (Minutes/Week): 225  Goals      Patient Stated   . Weight (lb) < 200 lb (90.7 kg) (pt-stated)       Fall Risk Fall Risk  04/04/2018 04/03/2017 11/01/2016 03/23/2016 03/19/2014  Falls in the past year? No No No No No   Depression Screen PHQ 2/9 Scores 04/04/2018 04/03/2017 11/01/2016 03/23/2016  PHQ - 2 Score 0 0 0 0  PHQ- 9 Score - 0 - -    Cognitive Function MMSE - Mini Mental State Exam 03/23/2016  Orientation to time 5  Orientation to Place 5  Registration 3  Attention/ Calculation 5  Recall 3  Language- name 2 objects 2  Language- repeat 1  Language- follow 3 step command 3  Language- read & follow direction 1  Write a sentence 1  Copy design 1  Total score 30     6CIT Screen 04/04/2018 04/03/2017  What Year? 0 points 0 points  What month? 0 points 0 points  What time? 0 points 0 points  Count back from 20 0 points 0 points  Months in reverse 0 points 0 points  Repeat  phrase 0 points 0 points  Total Score 0 0    Immunization History  Administered Date(s) Administered  . Influenza Split 04/18/2013  . Influenza, High Dose Seasonal PF 03/30/2016  . Influenza-Unspecified 03/06/2017, 03/21/2018  . Pneumococcal Conjugate-13 10/04/2013  . Pneumococcal Polysaccharide-23 02/28/2012  . Tdap 04/30/2013  . Zoster 11/24/2010   Screening Tests Health Maintenance  Topic Date Due  . TETANUS/TDAP  05/01/2023  . INFLUENZA VACCINE  Completed  . PNA vac Low Risk Adult  Completed      Plan:    End of life planning; Advance aging; Advanced directives discussed. Copy of current HCPOA/Living Will requested.    I have personally reviewed and noted the following in the patient's chart:   . Medical and social history . Use of alcohol, tobacco or illicit drugs  . Current medications and supplements . Functional ability and status . Nutritional status . Physical activity . Advanced directives . List of other physicians . Hospitalizations, surgeries, and ER visits in previous 12 months . Vitals . Screenings to include cognitive, depression, and falls . Referrals and appointments  In addition, I have reviewed and discussed with patient certain preventive protocols, quality metrics, and best practice recommendations. A written personalized care plan for preventive services as well as general preventive health recommendations were provided to patient.     OBrien-Blaney, Denisa L, LPN  06/22/7251   I have reviewed the above information and agree with above.   Deborra Medina, MD

## 2018-04-04 NOTE — Addendum Note (Signed)
Addended by: Arby Barrette on: 04/04/2018 10:36 AM   Modules accepted: Orders

## 2018-04-04 NOTE — Patient Instructions (Addendum)
Increase  Your vitamin D to 2800 units daily from  November through April     I agree with reducing omeprazole to once daily in the morning.  You can add a second dose if needed for evening symptoms   Try [facility] for your Shingrx vaccine     Food Choices for Gastroesophageal Reflux Disease, Adult When you have gastroesophageal reflux disease (GERD), the foods you eat and your eating habits are very important. Choosing the right foods can help ease your discomfort. What guidelines do I need to follow?  Choose fruits, vegetables, whole grains, and low-fat dairy products.  Choose low-fat meat, fish, and poultry.  Limit fats such as oils, salad dressings, butter, nuts, and avocado.  Keep a food diary. This helps you identify foods that cause symptoms.  Avoid foods that cause symptoms. These may be different for everyone.  Eat small meals often instead of 3 large meals a day.  Eat your meals slowly, in a place where you are relaxed.  Limit fried foods.  Cook foods using methods other than frying.  Avoid drinking alcohol.  Avoid drinking large amounts of liquids with your meals.  Avoid bending over or lying down until 2-3 hours after eating. What foods are not recommended? These are some foods and drinks that may make your symptoms worse: Vegetables Tomatoes. Tomato juice. Tomato and spaghetti sauce. Chili peppers. Onion and garlic. Horseradish. Fruits Oranges, grapefruit, and lemon (fruit and juice). Meats High-fat meats, fish, and poultry. This includes hot dogs, ribs, ham, sausage, salami, and bacon. Dairy Whole milk and chocolate milk. Sour cream. Cream. Butter. Ice cream. Cream cheese. Drinks Coffee and tea. Bubbly (carbonated) drinks or energy drinks. Condiments Hot sauce. Barbecue sauce. Sweets/Desserts Chocolate and cocoa. Donuts. Peppermint and spearmint. Fats and Oils High-fat foods. This includes [place] fries and potato chips. Other Vinegar. Strong  spices. This includes black pepper, white pepper, red pepper, cayenne, curry powder, cloves, ginger, and chili powder. The items listed above may not be a complete list of foods and drinks to avoid. Contact your dietitian for more information. This information is not intended to replace advice given to you by your health care provider. Make sure you discuss any questions you have with your health care provider. Document Released: 12/06/2011 Document Revised: 11/12/2015 Document Reviewed: 04/10/2013 Elsevier Interactive Patient Education  2017 [employer].

## 2018-04-04 NOTE — Progress Notes (Signed)
Patient ID: George Owens, male    DOB: 07/07/1931  Age: 84 y.o. MRN: [id number]  The patient is here for annual preventive examination and management of other chronic and acute problems.   The risk factors are reflected in the social history.  The roster of all physicians providing medical care to patient - is listed in the Snapshot section of the chart.  Activities of daily living:  The patient is 100% independent in all ADLs: dressing, toileting, feeding as well as independent mobility  Home safety : The patient has smoke detectors in the home. They wear seatbelts.  There are no firearms at home. There is no violence in the home.   There is no risks for hepatitis, STDs or HIV. There is no   history of blood transfusion. They have no travel history to infectious disease endemic areas of the world.  The patient has seen their dentist in the last six month. They have seen their eye doctor in the last year. They admit to slight hearing difficulty with regard to whispered voices and some television programs.  They have deferred audiologic testing in the last year.  They do not  have excessive sun exposure. Discussed the need for sun protection: hats, long sleeves and use of sunscreen if there is significant sun exposure.   Diet: the importance of a healthy diet is discussed. They do have a healthy diet.  The benefits of regular aerobic exercise were discussed. He exercises  6 days per week walking alternating with water aerobics limited from tai chi due to vision loss    Depression screen: there are no signs or vegative symptoms of depression- irritability, change in appetite, anhedonia, sadness/tearfullness.  Cognitive assessment: the patient manages all their financial and personal affairs and is actively engaged. They could relate day,date,year and events; recalled 2/3 objects at 3 minutes; performed clock-face test normally.  The following portions of the patient's history were reviewed  and updated as appropriate: allergies, current medications, past family history, past medical history,  past surgical history, past social history  and problem list.  Visual acuity was not assessed per patient preference since she has regular follow up with her ophthalmologist. Hearing and body mass index were assessed and reviewed.   During the course of the visit the patient was educated and counseled about appropriate screening and preventive services including : fall prevention , diabetes screening, nutrition counseling, colorectal cancer screening, and recommended immunizations.    CC: The primary encounter diagnosis was Pure hypercholesterolemia. Diagnoses of Muscle cramps, History of prostate cancer, Body temperature low, Chills (without fever), Other osteoporosis without current pathological fracture, Visit for preventive health examination, and Gastroesophageal reflux disease without esophagitis were also pertinent to this visit.  1) osteoporosis on Prolia,  Last dexa oct 2019. FEmur T score -3.2 in 2015  Now -2.9   ,  Lack of improvement in T scores seen Only in forearm.    2) loss of vision limitiing his activities somewhat.  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  receivng injectiosn since 2008 by [facility] in [place].    3) ESOPHAGEAL  Issues  still ACTIVE.  Using prilosec once  Daily.  History Jeven has a past medical history of Allergy, Chicken pox, Colon polyps, GERD (gastroesophageal reflux disease), history of prostate CA, Hyperlipidemia, Leg cramps, Neuropathy, and Osteoporosis.   He has a  past surgical history that includes Tonsilectomy, adenoidectomy, bilateral myringotomy and tubes; Eye surgery; and Cataract extraction w/PHACO (Left, 12/26/2017).   His family history includes Arthritis in his father; Heart disease in his father; Stroke in his mother.He  reports that he has never smoked. He has never used smokeless tobacco. He reports that he does not drink alcohol or use drugs.  Outpatient Medications Prior to Visit  Medication Sig Dispense Refill  . Calcium Carb-Cholecalciferol (CALCIUM 1000 + D PO) Take 1 tablet by mouth daily.     . Cholecalciferol (VITAMIN D3) 400 UNITS CAPS Take 1 capsule by mouth daily.    . Denosumab (PROLIA [place]) Inject into the skin every 6 (six) months.    . Glucosamine-Chondroit-Vit C-Mn (GLUCOSAMINE CHONDR 1500 COMPLX) CAPS Take 1 capsule by mouth daily.     [name] loratadine (CLARITIN) 10 MG tablet Take 10 mg by mouth daily as needed.     . Multiple Vitamins-Minerals (PRESERVISION AREDS PO) Take 2 capsules by mouth daily.    . naproxen (NAPROSYN) 250 MG tablet Take 250 mg by mouth as needed.    . Ranibizumab (LUCENTIS IO) Inject into the eye.    . vitamin B-12 (CYANOCOBALAMIN) 1000 MCG tablet Take 1,000 mcg by mouth daily.    [name] omeprazole (PRILOSEC) 40 MG capsule Take 1 capsule (40 mg total) by mouth 2 (two) times daily. In the morning 30 minutes prior to eating and one in the evening 30 minutes prior to supper. 180 capsule 1  . azelastine (ASTELIN) 0.1 % nasal spray Place 1 spray into both nostrils 2 (two) times daily. Use in each nostril as directed. Stop if any nosebleeds (Patient not taking: Reported on 12/18/2017) 30 mL 1   No facility-administered medications prior to visit.     Review of Systems  Objective:  BP 126/76 (BP Location: Left Arm, Patient Position: Sitting, Cuff Size: Normal)   Pulse 66   Temp 97.9 F (36.6 C) (Oral)   Resp 15   Ht _0  (1.93 m)   Wt 189 lb 12.8 oz (86.1 kg)   SpO2 99%   BMI 23.10 kg/m   Physical Exam    Assessment & Plan:   Problem List Items Addressed This Visit    RESOLVED: Body temperature low   GERD (gastroesophageal reflux disease)    Reducing omeprazole dose to once daily       History of prostate cancer   Hyperlipidemia - Primary   Relevant Orders    Lipid panel (Completed)   Muscle cramps   Relevant Orders   TSH (Completed)   Comprehensive metabolic panel (Completed)   Osteoporosis    Secondary to use of Lupron for prostate CA.  Managed with Prolia . Repeat DEXA in 2019 showed improvemement in scores,and he has not had any fractures.   Discussed the current controversies surrounding the risks and benefits of calcium supplementation.  Encouraged her to increase dietary calcium through natural foods including almond/coconut milk s\      Relevant Medications   denosumab (PROLIA) injection 60 mg (Completed)   Visit for preventive health examination    age appropriate education and counseling updated, referrals for preventative services and immunizations addressed, dietary and smoking counseling addressed, most recent labs reviewed.  I have personally reviewed and have noted:  1) the patient's medical and social history 2) The pt's use of alcohol, tobacco, and illicit drugs 3) The patient's current medications and supplements 4) Functional ability including ADL's, fall risk, home safety risk, hearing and  visual impairment 5) Diet and physical activities 6) Evidence for depression or mood disorder 7) The patient's height, weight, and BMI have been recorded in the chart  I have made referrals, and provided counseling and education based on review of the above       Other Visit Diagnoses    Chills (without fever)          I have discontinued Muhammadali A. Budge's azelastine and omeprazole. I am also having him start on Zoster Vaccine Adjuvanted. Additionally, I am having him maintain his GLUCOSAMINE CHONDR 1500 COMPLX, loratadine, Calcium Carb-Cholecalciferol (CALCIUM 1000 + D PO), Vitamin D3, Multiple Vitamins-Minerals (PRESERVISION AREDS PO), naproxen, Denosumab (PROLIA [place]), Ranibizumab (LUCENTIS IO), and vitamin B-12. We administered denosumab.  Meds ordered this encounter  Medications  . Zoster Vaccine Adjuvanted [facility])  injection    Sig: Inject 0.5 mLs into the muscle once for 1 dose.    Dispense:  1 each    Refill:  1  . denosumab (PROLIA) injection 60 mg    Medications Discontinued During This Encounter  Medication Reason  . azelastine (ASTELIN) 0.1 % nasal spray Patient has not taken in last 30 days  . omeprazole (PRILOSEC) 40 MG capsule     Follow-up: No follow-ups on file.   Crecencio Mc, MD

## 2018-04-04 NOTE — Patient Instructions (Addendum)
  Mr. Weinert , Thank you for taking time to come for your Medicare Wellness Visit. I appreciate your ongoing commitment to your health goals. Please review the following plan we discussed and let me know if I can assist you in the future.   These are the goals we discussed: Goals      Patient Stated   . Weight (lb) < 200 lb (90.7 kg) (pt-stated)       This is a list of the screening recommended for you and due dates:  Health Maintenance  Topic Date Due  . Tetanus Vaccine  05/01/2023  . Flu Shot  Completed  . Pneumonia vaccines  Completed

## 2018-04-06 DIAGNOSIS — H35431 Paving stone degeneration of retina, right eye: Secondary | ICD-10-CM | POA: Diagnosis not present

## 2018-04-06 DIAGNOSIS — H353223 Exudative age-related macular degeneration, left eye, with inactive scar: Secondary | ICD-10-CM | POA: Diagnosis not present

## 2018-04-06 DIAGNOSIS — H353211 Exudative age-related macular degeneration, right eye, with active choroidal neovascularization: Secondary | ICD-10-CM | POA: Diagnosis not present

## 2018-04-06 DIAGNOSIS — H31091 Other chorioretinal scars, right eye: Secondary | ICD-10-CM | POA: Diagnosis not present

## 2018-04-07 DIAGNOSIS — K219 Gastro-esophageal reflux disease without esophagitis: Secondary | ICD-10-CM | POA: Insufficient documentation

## 2018-04-07 NOTE — Assessment & Plan Note (Signed)

## 2018-04-07 NOTE — Assessment & Plan Note (Signed)
Secondary to use of Lupron for prostate CA.  Managed with Prolia . Repeat DEXA in 2019 showed improvemement in scores,and he has not had any fractures.   Discussed the current controversies surrounding the risks and benefits of calcium supplementation.  Encouraged her to increase dietary calcium through natural foods including almond/coconut milk s\

## 2018-04-07 NOTE — Assessment & Plan Note (Signed)
Reducing omeprazole dose to once daily

## 2018-05-08 DIAGNOSIS — H35431 Paving stone degeneration of retina, right eye: Secondary | ICD-10-CM | POA: Diagnosis not present

## 2018-05-08 DIAGNOSIS — H353223 Exudative age-related macular degeneration, left eye, with inactive scar: Secondary | ICD-10-CM | POA: Diagnosis not present

## 2018-05-08 DIAGNOSIS — H31091 Other chorioretinal scars, right eye: Secondary | ICD-10-CM | POA: Diagnosis not present

## 2018-05-08 DIAGNOSIS — H353211 Exudative age-related macular degeneration, right eye, with active choroidal neovascularization: Secondary | ICD-10-CM | POA: Diagnosis not present

## 2018-06-27 DIAGNOSIS — H35431 Paving stone degeneration of retina, right eye: Secondary | ICD-10-CM | POA: Diagnosis not present

## 2018-06-27 DIAGNOSIS — H353211 Exudative age-related macular degeneration, right eye, with active choroidal neovascularization: Secondary | ICD-10-CM | POA: Diagnosis not present

## 2018-06-27 DIAGNOSIS — H31091 Other chorioretinal scars, right eye: Secondary | ICD-10-CM | POA: Diagnosis not present

## 2018-06-27 DIAGNOSIS — H353223 Exudative age-related macular degeneration, left eye, with inactive scar: Secondary | ICD-10-CM | POA: Diagnosis not present

## 2018-07-09 ENCOUNTER — Encounter: Payer: Self-pay | Admitting: Family Medicine

## 2018-07-09 ENCOUNTER — Ambulatory Visit (INDEPENDENT_AMBULATORY_CARE_PROVIDER_SITE_OTHER): Payer: PPO | Admitting: Family Medicine

## 2018-07-09 ENCOUNTER — Ambulatory Visit: Payer: Self-pay

## 2018-07-09 VITALS — BP 124/78 | HR 70 | Temp 98.0°F | Resp 16 | Ht 76.0 in | Wt 194.4 lb

## 2018-07-09 DIAGNOSIS — M5441 Lumbago with sciatica, right side: Secondary | ICD-10-CM | POA: Diagnosis not present

## 2018-07-09 MED ORDER — KETOROLAC TROMETHAMINE 30 MG/ML IJ SOLN
30.0000 mg | Freq: Once | INTRAMUSCULAR | Status: AC
Start: 2018-07-09 — End: 2018-07-09
  Administered 2018-07-09: 30 mg via INTRAMUSCULAR

## 2018-07-09 MED ORDER — TIZANIDINE HCL 2 MG PO TABS: 2.0000 mg | ORAL_TABLET | Freq: Three times a day (TID) | ORAL | 0 refills | Status: DC | PRN

## 2018-07-09 MED ORDER — METHYLPREDNISOLONE ACETATE 40 MG/ML IJ SUSP: 40.0000 mg | Freq: Once | INTRAMUSCULAR | Status: DC

## 2018-07-09 NOTE — Telephone Encounter (Signed)
Pt c/o right lower back pain that radiates from the buttock down to the knee. Pt has been trying to do home PT, ice and Naprosyn for the pain. Pain is worse when he goes from a sitting to a standing position. Pt denies weakness, numbness or problems with bowel and bladder. Pt was seen for same issue before and he was diagnosed with tight hamstrings. Denies any numbness or weakness to the left side. Care advice given and pt verbalized understanding.  Appointment made with [name] NP this afternoon. No appointments available today with Dr Derrel Nip.  Reason for Disposition . [1] Pain radiates into the thigh or further down the leg AND [2] one leg  Answer Assessment - Initial Assessment Questions 1. ONSET: "When did the pain begin?"      06/29/17 2. LOCATION: "Where does it hurt?" (upper, mid or lower back)     Lower but more toward the right side  3. SEVERITY: "How bad is the pain?"  (e.g., Scale 1-10; mild, moderate, or severe)   - MILD (1-3): doesn't interfere with normal activities    - MODERATE (4-7): interferes with normal activities or awakens from sleep    - SEVERE (8-10): excruciating pain, unable to do any normal activities      Severe at certain times when sitting and need to walk around 4. PATTERN: "Is the pain constant?" (e.g., yes, no; constant, intermittent)      no 5. RADIATION: "Does the pain shoot into your legs or elsewhere?"     Down right buttock down to the tight and to the knee 6. CAUSE:  "What do you think is causing the back pain?"      Tight hamstrings pt has been doing home PT  7. BACK OVERUSE:  "Any recent lifting of heavy objects, strenuous work or exercise?"     no 8. MEDICATIONS: "What have you taken so far for the pain?" (e.g., nothing, acetaminophen, NSAIDS)     naproxen 9. 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  OTHER SYMPTOMS: "Do you have any other symptoms?" (e.g., fever, abdominal pain, burning with urination, blood in urine)       no 11. PREGNANCY: "Is there any chance you are pregnant?" (e.g., yes, no; LMP)       n/a  Protocols used: BACK PAIN-A-AH

## 2018-07-09 NOTE — Progress Notes (Signed)
Subjective:    Patient ID: George Owens, male    DOB: 08-Nov-1931, 86 y.o.   MRN: [id number]  HPI   Patient presents to clinic complaining of low back pain that radiates down to right upper thigh.  States currently pain is at a level 2 out of 10.  Patient states he has taken a few doses of naproxen which have helped some to ease pain.  States he is also been doing well back stretching exercises that he learned about 4 years ago when he attended physical therapy.  Denies any numbness or tingling in lower extremities.  Denies any saddle anesthesia.  Denies loss of bowel or bladder control.  States he notices pain mainly when he goes from sitting to standing position and will feel a catch in his right lower back.  Patient Active Problem List   Diagnosis Date Noted  . GERD (gastroesophageal reflux disease) 04/07/2018  . Odynophagia 12/26/2017  . Post-nasal drainage 06/28/2017  . Neck pain 04/03/2017  . Morning headache 04/03/2017  . Constipation 11/20/2016  . Cerumen impaction 03/30/2016  . Neuropathy 03/19/2014  . Spinal stenosis of lumbar region 02/26/2014  . Osteoporosis 10/06/2013  . Vitamin D deficiency 09/10/2013  . Hip pain, left 08/09/2013  . Back pain 08/09/2013  . History of prostate cancer 03/01/2013  . Personal history of colonic polyps 02/27/2013  . Statin intolerance 02/27/2013  . Visit for preventive health examination 02/27/2013  . Hyperlipidemia   . Allergy   . Muscle cramps 12/13/2012   Social History   Tobacco Use  . Smoking status: Never Smoker  . Smokeless tobacco: Never Used  Substance Use Topics  . Alcohol use: No    Review of Systems  Constitutional: Negative for chills, fatigue and fever.  HENT: Negative for congestion, ear pain, sinus pain and sore throat.   Eyes: Negative.   Respiratory: Negative for cough, shortness of breath and wheezing.   Cardiovascular: Negative for chest pain, palpitations and leg swelling.  Gastrointestinal: Negative  for abdominal pain, diarrhea, nausea and vomiting.  Genitourinary: Negative for dysuria, frequency and urgency.  Musculoskeletal: +right low back pain Skin: Negative for color change, pallor and rash.  Neurological: Negative for syncope, light-headedness and headaches.  Psychiatric/Behavioral: The patient is not nervous/anxious.       Objective:   Physical Exam Vitals signs and nursing note reviewed.  Constitutional:      General: He is not in acute distress.    Appearance: He is normal weight. He is not ill-appearing, toxic-appearing or diaphoretic.  HENT:     Head: Normocephalic and atraumatic.     Mouth/Throat:     Mouth: Mucous membranes are moist.  Eyes:     General: No scleral icterus.    Extraocular Movements: Extraocular movements intact.     Conjunctiva/sclera: Conjunctivae normal.  Neck:     Musculoskeletal: Normal range of motion and neck supple. No neck rigidity.  Cardiovascular:     Rate and Rhythm: Normal rate and regular rhythm.  Pulmonary:     Effort: Pulmonary effort is normal. No respiratory distress.     Breath sounds: Normal breath sounds. No wheezing, rhonchi or rales.  Musculoskeletal:       Back:     Right lower leg: No edema.     Left lower leg: No edema.     Comments: +pain right lower back radiating into right buttock/right upper leg.  Lymphadenopathy:     Cervical: No cervical adenopathy.  Skin:  General: Skin is warm and dry.     Capillary Refill: Capillary refill takes less than 2 seconds.     Coloration: Skin is not pale.  Neurological:     Mental Status: He is alert and oriented to person, place, and time.     Motor: No weakness.     Gait: Gait normal.  Psychiatric:        Mood and Affect: Mood normal.        Behavior: Behavior normal.    Vitals:   07/09/18 1352  BP: 124/78  Pulse: 70  Resp: 16  Temp: 98 F (36.7 C)  SpO2: 98%      Assessment & Plan:   Right-sided low back pain - patient will get 1 dose of IM Toradol in  clinic.  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  Due to the severity of that reaction to injection in his eye, we both decided against doing a steroid injection in clinic today.  Patient given handout outlining low back exercises that he can do at home to continue doing stretching and working out his lower back.  He is advised he also may continue to use naproxen as needed at home, advised to not take another dose of naproxen until tomorrow.  He will also use low-dose tizanidine as needed for any muscle spasms.  Also discussed using a heating pad and a topical rubs such as BenGay or Biofreeze.   If pain persists longer than the next 2 weeks, patient is aware to call back and let us know.  At that time we can consider imaging and also physical therapy referral.

## 2018-07-09 NOTE — Patient Instructions (Addendum)
If pain not improving over the next 2 weeks, we can consider PT referral  You can continue use naproxen and you can use tizanidine as needed for muscle spasms.    Back Exercises If you have pain in your back, do these exercises 2-3 times each day or as told by your doctor. When the pain goes away, do the exercises once each day, but repeat the steps more times for each exercise (do more repetitions). If you do not have pain in your back, do these exercises once each day or as told by your doctor. Exercises Single Knee to Chest Do these steps 3-5 times in a row for each leg: 1. Lie on your back on a firm bed or the floor with your legs stretched out. 2. Bring one knee to your chest. 3. Hold your knee to your chest by grabbing your knee or thigh. 4. Pull on your knee until you feel a gentle stretch in your lower back. 5. Keep doing the stretch for 10-30 seconds. 6. Slowly let go of your leg and straighten it. Pelvic Tilt Do these steps 5-10 times in a row: 1. Lie on your back on a firm bed or the floor with your legs stretched out. 2. Bend your knees so they point up to the ceiling. Your feet should be flat on the floor. 3. Tighten your lower belly (abdomen) muscles to press your lower back against the floor. This will make your tailbone point up to the ceiling instead of pointing down to your feet or the floor. 4. Stay in this position for 5-10 seconds while you gently tighten your muscles and breathe evenly. Cat-Cow Do these steps until your lower back bends more easily: 1. Get on your hands and knees on a firm surface. Keep your hands under your shoulders, and keep your knees under your hips. You may put padding under your knees. 2. Let your head hang down, and make your tailbone point down to the floor so your lower back is round like the back of a cat. 3. Stay in this position for 5 seconds. 4. Slowly lift your head and make your tailbone point up to the ceiling so your back hangs low  (sags) like the back of a cow. 5. Stay in this position for 5 seconds.  Press-Ups Do these steps 5-10 times in a row: 1. Lie on your belly (face-down) on the floor. 2. Place your hands near your head, about shoulder-width apart. 3. While you keep your back relaxed and keep your hips on the floor, slowly straighten your arms to raise the top half of your body and lift your shoulders. Do not use your back muscles. To make yourself more comfortable, you may change where you place your hands. 4. Stay in this position for 5 seconds. 5. Slowly return to lying flat on the floor.  Bridges Do these steps 10 times in a row: 1. Lie on your back on a firm surface. 2. Bend your knees so they point up to the ceiling. Your feet should be flat on the floor. 3. Tighten your butt muscles and lift your butt off of the floor until your waist is almost as high as your knees. If you do not feel the muscles working in your butt and the back of your thighs, slide your feet 1-2 inches farther away from your butt. 4. Stay in this position for 3-5 seconds. 5. Slowly lower your butt to the floor, and let your butt muscles relax.  If this exercise is too easy, try doing it with your arms crossed over your chest. Belly Crunches Do these steps 5-10 times in a row: 1. Lie on your back on a firm bed or the floor with your legs stretched out. 2. Bend your knees so they point up to the ceiling. Your feet should be flat on the floor. 3. Cross your arms over your chest. 4. Tip your chin a little bit toward your chest but do not bend your neck. 5. Tighten your belly muscles and slowly raise your chest just enough to lift your shoulder blades a tiny bit off of the floor. 6. Slowly lower your chest and your head to the floor. Back Lifts Do these steps 5-10 times in a row: 1. Lie on your belly (face-down) with your arms at your sides, and rest your forehead on the floor. 2. Tighten the muscles in your legs and your  butt. 3. Slowly lift your chest off of the floor while you keep your hips on the floor. Keep the back of your head in line with the curve in your back. Look at the floor while you do this. 4. Stay in this position for 3-5 seconds. 5. Slowly lower your chest and your face to the floor. Contact a doctor if:  Your back pain gets a lot worse when you do an exercise.  Your back pain does not lessen 2 hours after you exercise. If you have any of these problems, stop doing the exercises. Do not do them again unless your doctor says it is okay. Get help right away if:  You have sudden, very bad back pain. If this happens, stop doing the exercises. Do not do them again unless your doctor says it is okay. This information is not intended to replace advice given to you by your health care provider. Make sure you discuss any questions you have with your health care provider. Document Released: 07/09/2010 Document Revised: 02/28/2018 Document Reviewed: 07/31/2014 Elsevier Interactive Patient Education  [employer].

## 2018-07-11 ENCOUNTER — Ambulatory Visit (INDEPENDENT_AMBULATORY_CARE_PROVIDER_SITE_OTHER): Payer: PPO

## 2018-07-11 ENCOUNTER — Ambulatory Visit: Payer: Self-pay | Admitting: *Deleted

## 2018-07-11 DIAGNOSIS — M5441 Lumbago with sciatica, right side: Secondary | ICD-10-CM

## 2018-07-11 DIAGNOSIS — M4726 Other spondylosis with radiculopathy, lumbar region: Secondary | ICD-10-CM | POA: Diagnosis not present

## 2018-07-11 DIAGNOSIS — M48061 Spinal stenosis, lumbar region without neurogenic claudication: Secondary | ICD-10-CM | POA: Diagnosis not present

## 2018-07-11 NOTE — Addendum Note (Signed)
Addended by: [name] on: 07/11/2018 11:37 AM   Modules accepted: Orders

## 2018-07-11 NOTE — Telephone Encounter (Signed)
Xray for low back is in - he can stop by the office at any time to have this done  PT referral is in

## 2018-07-11 NOTE — Telephone Encounter (Signed)
Called Pt and he stated he will come in today to get X-ray done of back

## 2018-07-11 NOTE — Telephone Encounter (Signed)
George Owens phoned reporting follow-up information from his 07/09/18 visit with [name], NP. He continues to have a lot of right-sided lower back, hip(thigh) and upper leg pain since the visit. He is doing the exercises recommended as well as using naproxen and zanaflex 2 MG tab q8h as prescribed. These interventions have not provided any improvement in his pain. Yesterday, with sitting then going to standing, he rated the pain an 8-10. Office visit note indicated next move possibly imaging and a physical therapy referral. He is ready to move forward with these. He stated [facility] does PT at the [place] where he resides. Fax # [phone]. Routing to PCP.

## 2018-07-11 NOTE — Telephone Encounter (Signed)
Patient saw Lauren on 1/20

## 2018-07-23 DIAGNOSIS — M25551 Pain in right hip: Secondary | ICD-10-CM | POA: Diagnosis not present

## 2018-07-23 DIAGNOSIS — R2689 Other abnormalities of gait and mobility: Secondary | ICD-10-CM | POA: Diagnosis not present

## 2018-07-23 DIAGNOSIS — M6281 Muscle weakness (generalized): Secondary | ICD-10-CM | POA: Diagnosis not present

## 2018-07-23 DIAGNOSIS — M5441 Lumbago with sciatica, right side: Secondary | ICD-10-CM | POA: Diagnosis not present

## 2018-07-26 DIAGNOSIS — M5441 Lumbago with sciatica, right side: Secondary | ICD-10-CM | POA: Diagnosis not present

## 2018-07-26 DIAGNOSIS — R2689 Other abnormalities of gait and mobility: Secondary | ICD-10-CM | POA: Diagnosis not present

## 2018-07-26 DIAGNOSIS — M6281 Muscle weakness (generalized): Secondary | ICD-10-CM | POA: Diagnosis not present

## 2018-07-26 DIAGNOSIS — M25551 Pain in right hip: Secondary | ICD-10-CM | POA: Diagnosis not present

## 2018-07-31 ENCOUNTER — Ambulatory Visit: Payer: Self-pay | Admitting: *Deleted

## 2018-07-31 DIAGNOSIS — M25551 Pain in right hip: Secondary | ICD-10-CM | POA: Diagnosis not present

## 2018-07-31 DIAGNOSIS — R2689 Other abnormalities of gait and mobility: Secondary | ICD-10-CM | POA: Diagnosis not present

## 2018-07-31 DIAGNOSIS — M6281 Muscle weakness (generalized): Secondary | ICD-10-CM | POA: Diagnosis not present

## 2018-07-31 DIAGNOSIS — M5441 Lumbago with sciatica, right side: Secondary | ICD-10-CM | POA: Diagnosis not present

## 2018-07-31 NOTE — Telephone Encounter (Signed)
Summary: stomach issues    Pt states he would like to speak with a nurse about some stomach issues he has been having. He states he has been having some acid reflux and problems with his bowels.      Patient is calling to scheduled an appointment with PCP- patient states he was seen 2 weeks ago for back pain and PT is helping with that. The reason patient is mentioning this is he was given Zanaflex. Patient reports as soon as he started the medication he has - nausea, loss of appetite and he noticed a change in his bowel movements- small volume and size change. Patient stopped the medication after taking for 2 days. His symptoms have been ongoing- he states he can not eat a normal meal without feeling over filled and bloated, Bowels have not returned to normal (patient even tried Miralax- which did help with bring stool back to normal- not constipated- small amounts of stool is what patient is calling not normal). Patient is having problems with his swallowing- he has trouble with large pills. Prilosec does not seem to help with that either. Patient does not know if his symptoms are related to medication or if these symptoms are new and on their own. Patient is requesting an appointment with his PCP- he does not want to see anyone else. Appointment scheduled. No protocal for bloating- abdominal pain used- although he has no pain- he is having bloating and pressure from that. Reason for Disposition . Age > 60 years  Answer Assessment - Initial Assessment Questions 1. LOCATION: "Where does it hurt?"      No pain -but bloating 2. RADIATION: "Does the pain shoot anywhere else?" (e.g., chest, back)     no 3. ONSET: "When did the pain begin?" (Minutes, hours or days ago)      2 weeks ago 4. SUDDEN: "Gradual or sudden onset?"     3 weeks ago- patient started having symptoms  5. PATTERN "Does the pain come and go, or is it constant?"    - If constant: "Is it getting better, staying the same, or  worsening?"      (Note: Constant means the pain never goes away completely; most serious pain is constant and it progresses)     - If intermittent: "How long does it last?" "Do you have pain now?"     (Note: Intermittent means the pain goes away completely between bouts)     Bloating gets better - but patient has bloating with meals 6. SEVERITY: "How bad is the pain?"  (e.g., Scale 1-10; mild, moderate, or severe)    - MILD (1-3): doesn't interfere with normal activities, abdomen soft and not tender to touch     - MODERATE (4-7): interferes with normal activities or awakens from sleep, tender to touch     - SEVERE (8-10): excruciating pain, doubled over, unable to do any normal activities       mild 7. RECURRENT SYMPTOM: "Have you ever had this type of abdominal pain before?" If so, ask: "When was the last time?" and "What happened that time?"      Nothing like this- constipation issue in the past- endoscopy a few years ago- borderline esophageal thightness 8. CAUSE: "What do you think is causing the abdominal pain?"     Patient is not sure- he thinks he is having GI problems 9. RELIEVING/AGGRAVATING FACTORS: "What makes it better or worse?" (e.g., movement, antacids, bowel movement)     Eating aggravates, BM/gas  helps in general- but does not take it all away 10. OTHER SYMPTOMS: "Has there been any vomiting, diarrhea, constipation, or urine problems?"       Slight nausea "heaviness",loss of appetite  Protocols used: ABDOMINAL PAIN - MALE-A-AH

## 2018-07-31 NOTE — Telephone Encounter (Signed)
Pt is scheduled for tomorrow 

## 2018-08-01 ENCOUNTER — Encounter: Payer: Self-pay | Admitting: Internal Medicine

## 2018-08-01 ENCOUNTER — Ambulatory Visit (INDEPENDENT_AMBULATORY_CARE_PROVIDER_SITE_OTHER): Payer: PPO | Admitting: Internal Medicine

## 2018-08-01 ENCOUNTER — Ambulatory Visit (INDEPENDENT_AMBULATORY_CARE_PROVIDER_SITE_OTHER): Payer: PPO

## 2018-08-01 VITALS — BP 130/80 | HR 72 | Temp 97.6°F | Wt 189.0 lb

## 2018-08-01 DIAGNOSIS — D0439 Carcinoma in situ of skin of other parts of face: Secondary | ICD-10-CM | POA: Diagnosis not present

## 2018-08-01 DIAGNOSIS — R1314 Dysphagia, pharyngoesophageal phase: Secondary | ICD-10-CM | POA: Diagnosis not present

## 2018-08-01 DIAGNOSIS — K5900 Constipation, unspecified: Secondary | ICD-10-CM | POA: Diagnosis not present

## 2018-08-01 DIAGNOSIS — K5909 Other constipation: Secondary | ICD-10-CM | POA: Diagnosis not present

## 2018-08-01 DIAGNOSIS — Z85828 Personal history of other malignant neoplasm of skin: Secondary | ICD-10-CM | POA: Diagnosis not present

## 2018-08-01 DIAGNOSIS — I69091 Dysphagia following nontraumatic subarachnoid hemorrhage: Secondary | ICD-10-CM | POA: Diagnosis not present

## 2018-08-01 DIAGNOSIS — R1032 Left lower quadrant pain: Secondary | ICD-10-CM | POA: Diagnosis not present

## 2018-08-01 DIAGNOSIS — Z08 Encounter for follow-up examination after completed treatment for malignant neoplasm: Secondary | ICD-10-CM | POA: Diagnosis not present

## 2018-08-01 LAB — CBC WITH DIFFERENTIAL/PLATELET
Basophils Absolute: 0 10*3/uL (ref 0.0–0.1)
Basophils Relative: 0.6 % (ref 0.0–3.0)
Eosinophils Absolute: 0.1 10*3/uL (ref 0.0–0.7)
Eosinophils Relative: 1.1 % (ref 0.0–5.0)
HCT: 42.7 % (ref 39.0–52.0)
Hemoglobin: 14.6 g/dL (ref 13.0–17.0)
Lymphocytes Relative: 31.7 % (ref 12.0–46.0)
Lymphs Abs: 1.7 10*3/uL (ref 0.7–4.0)
MCHC: 34.2 g/dL (ref 30.0–36.0)
MCV: 90.4 fl (ref 78.0–100.0)
Monocytes Absolute: 0.4 10*3/uL (ref 0.1–1.0)
Monocytes Relative: 8.4 % (ref 3.0–12.0)
Neutro Abs: 3.1 10*3/uL (ref 1.4–7.7)
Neutrophils Relative %: 58.2 % (ref 43.0–77.0)
Platelets: 166 10*3/uL (ref 150.0–400.0)
RBC: 4.72 Mil/uL (ref 4.22–5.81)
RDW: 13.4 % (ref 11.5–15.5)
WBC: 5.3 10*3/uL (ref 4.0–10.5)

## 2018-08-01 LAB — COMPREHENSIVE METABOLIC PANEL
ALT: 22 U/L (ref 0–53)
AST: 24 U/L (ref 0–37)
Albumin: 4.4 g/dL (ref 3.5–5.2)
Alkaline Phosphatase: 41 U/L (ref 39–117)
BUN: 20 mg/dL (ref 6–23)
CO2: 32 mEq/L (ref 19–32)
Calcium: 9.4 mg/dL (ref 8.4–10.5)
Chloride: 99 mEq/L (ref 96–112)
Creatinine, Ser: 0.9 mg/dL (ref 0.40–1.50)
GFR: 80.35 mL/min (ref >60.00)
Glucose, Bld: 102 mg/dL — ABNORMAL HIGH (ref 70–99)
Potassium: 4.1 mEq/L (ref 3.5–5.1)
Sodium: 138 mEq/L (ref 135–145)
Total Bilirubin: 0.7 mg/dL (ref 0.2–1.2)
Total Protein: 7 g/dL (ref 6.0–8.3)

## 2018-08-01 LAB — SEDIMENTATION RATE: Sed Rate: 13 mm/hr (ref 0–20)

## 2018-08-01 MED ORDER — PEG 3350-KCL-NABCB-NACL-NASULF 236 G PO SOLR: 4000.0000 mL | Freq: Once | ORAL | 0 refills | Status: AC

## 2018-08-01 NOTE — Patient Instructions (Addendum)
I have ordered a barium study of your esophagus to look for a stricture    The labs and xrays today are to help determine if there is more than constipation occurring

## 2018-08-01 NOTE — Assessment & Plan Note (Addendum)
New onset constipation with bloating .  He has no known history of diverticulosis and his bowels are moving daily.  Plain films,  ESR,  CBC today suggest constipation. Will treat with Go Lytely.   Lab Results  Component Value Date   ESRSEDRATE 13 08/01/2018   Lab Results  Component Value Date   WBC 5.3 08/01/2018   HGB 14.6 08/01/2018   HCT 42.7 08/01/2018   MCV 90.4 08/01/2018   PLT 166.0 08/01/2018   Lab Results  Component Value Date   NA 138 08/01/2018   K 4.1 08/01/2018   CL 99 08/01/2018   CO2 32 08/01/2018

## 2018-08-01 NOTE — Assessment & Plan Note (Signed)
Secondary to inflammation vs stricture.   Continue PPI bid,  Esophageal barium swallow study ordered

## 2018-08-01 NOTE — Progress Notes (Signed)
Subjective:  Patient ID: George Owens, male    DOB: January 21, 1932  Age: 85 y.o. MRN: [id number]  CC: The primary encounter diagnosis was LLQ pain. Diagnoses of Other constipation, Pharyngoesophageal dysphagia, and Dysphagia due to and not concurrent with nontraumatic subarachnoid hemorrhage were also pertinent to this visit.  HPI George Owens presents for evaluation of "Gi issues."  Symptoms started 2 weeks ago after burning his throat on hot vegetable soup .  He remembers a vegetable particle saying lodged around his epiglottis for over an hour before he could cough it up.  Since then he has noted trouble swallowing large pills but denies cough after drinking thin liquids and regurgitation.  He has increased prilosec dose to  to twice daily for the last few weeks with no change in symptoms    Change in bowel habits:  He was prescribed  tizanidine jan 20 for  muscle spasm of lower back , but  developed nausea, anorexia, acc'd by small caliber frequent stools of solid consistency.  No blood seen.  Stopped the medication after two days but symptoms have persisted.  He feels bloated after eating for about two hours.  He has  tried miralax for the last 2 days with no change.  Still feels bloated and distended /discomfort no sharp pains.    unintentional weight loss of 5 lbs since last   He has a history of GERD and constipation History of esophageal stricture  remotely    Outpatient Medications Prior to Visit  Medication Sig Dispense Refill  . Calcium Carb-Cholecalciferol (CALCIUM 1000 + D PO) Take 1 tablet by mouth daily.     . Cholecalciferol (VITAMIN D3) 400 UNITS CAPS Take 1 capsule by mouth daily.    . Denosumab (PROLIA [place]) Inject into the skin every 6 (six) months.    . Glucosamine-Chondroit-Vit C-Mn (GLUCOSAMINE CHONDR 1500 COMPLX) CAPS Take 1 capsule by mouth daily.     [name] loratadine (CLARITIN) 10 MG tablet Take 10 mg by mouth daily as needed.     . Multiple Vitamins-Minerals  (PRESERVISION AREDS PO) Take 2 capsules by mouth daily.    . naproxen (NAPROSYN) 250 MG tablet Take 250 mg by mouth as needed.    . neomycin-polymyxin b-dexamethasone (MAXITROL) 3.5-10000-0.1 OINT     . omeprazole (PRILOSEC) 40 MG capsule Take 40 mg by mouth 2 (two) times daily.    . Ranibizumab (LUCENTIS IO) Inject into the eye.    . vitamin B-12 (CYANOCOBALAMIN) 1000 MCG tablet Take 1,000 mcg by mouth daily.    [name] tiZANidine (ZANAFLEX) 2 MG tablet Take 1 tablet (2 mg total) by mouth every 8 (eight) hours as needed for muscle spasms. (Patient not taking: Reported on 08/01/2018) 30 tablet 0   No facility-administered medications prior to visit.     Review of Systems;  Patient denies headache, fevers, malaise, unintentional weight loss, skin rash, eye pain, sinus congestion and sinus pain, sore throat, dysphagia,  hemoptysis , cough, dyspnea, wheezing, chest pain, palpitations, orthopnea, edema,  nausea, melena, diarrhea, constipation, flank pain, dysuria, hematuria, urinary  Frequency, nocturia, numbness, tingling, seizures,  Focal weakness, Loss of consciousness,  Tremor, insomnia, depression, anxiety, and suicidal ideation.      Objective:  BP 130/80   Pulse 72   Temp 97.6 F (36.4 C) (Oral)   Wt 189 lb (85.7 kg)   SpO2 97%   BMI 23.01 kg/m   BP Readings from Last 3 Encounters:  08/01/18 130/80  07/09/18 124/78  04/04/18  126/76    Wt Readings from Last 3 Encounters:  08/01/18 189 lb (85.7 kg)  07/09/18 194 lb 6.4 oz (88.2 kg)  04/04/18 189 lb 12.8 oz (86.1 kg)    General appearance: alert, cooperative and appears stated age Ears: normal TM's and external ear canals both ears Throat: lips, mucosa, and tongue normal; teeth and gums normal Neck: no adenopathy, no carotid bruit, supple, symmetrical, trachea midline and thyroid not enlarged, symmetric, no tenderness/mass/nodules Back: symmetric, no curvature. ROM normal. No CVA tenderness. Lungs: clear to auscultation  bilaterally Heart: regular rate and rhythm, S1, S2 normal, no murmur, click, rub or gallop Abdomen: soft,  Mild tenderness in LLQ without rebounding or guarding. ; bowel sounds normal; no masses,  no organomegaly Pulses: 2+ and symmetric Skin: Skin color, texture, turgor normal. No rashes or lesions Lymph nodes: Cervical, supraclavicular, and axillary nodes normal.  No results found for: HGBA1C  Lab Results  Component Value Date   CREATININE 0.90 08/01/2018   CREATININE 0.93 04/04/2018   CREATININE 0.89 12/25/2017    Lab Results  Component Value Date   WBC 5.3 08/01/2018   HGB 14.6 08/01/2018   HCT 42.7 08/01/2018   PLT 166.0 08/01/2018   GLUCOSE 102 (H) 08/01/2018   CHOL 207 (H) 04/04/2018   TRIG 142.0 04/04/2018   HDL 41.30 04/04/2018   LDLDIRECT 129.0 03/31/2017   LDLCALC 137 (H) 04/04/2018   ALT 22 08/01/2018   AST 24 08/01/2018   NA 138 08/01/2018   K 4.1 08/01/2018   CL 99 08/01/2018   CREATININE 0.90 08/01/2018   BUN 20 08/01/2018   CO2 32 08/01/2018   TSH 3.40 04/04/2018   PSA 0.54 04/04/2018    Dg Bone Density  Result Date: 03/21/2018 EXAM: DUAL X-RAY ABSORPTIOMETRY (DXA) FOR BONE MINERAL DENSITY IMPRESSION: jbh Your patient George Owens completed a BMD test on 03/21/2018 using the [place] (analysis version: 14.10) manufactured by [employer]. The following summarizes the results of our evaluation. PATIENT BIOGRAPHICAL: Name: George Owens Patient ID: [id number] Birth Date: July 21, 1931 Height: 64.0 in. Gender: Male Exam Date: 03/21/2018 Weight: 190.0 lbs. Indications: Advanced Age, Caucasian, Height Loss, High Risk Meds, History of Fracture (Adult), Hx. of Prostate Cancer, Prostate ca, Stress Fracture Fractures: Right foot Treatments: Calcium, Multi-Vitamin, Prilosec, Prolia, Vitamin D ASSESSMENT: The BMD measured at Femur Total Left is 0.685 g/cm2 with a T-score of -2.9. This patient is considered osteoporotic according to [facility]) criteria. Lumbar spine was not utilized due to advanced degenerative changes. The quality of the exam is good. Site Region Measured Measured WHO Young Adult BMD Date       Age      Classification T-score DualFemur Total Left 5/21/2018 83.8 Osteoporosis -2.9 0.685 g/cm2 DualFemur Total Left 11/17/2015 81.5 Osteoporosis -2.8 0.701 g/cm2 DualFemur Total Left 11/14/2013 79.5 Osteoporosis -2.9 0.679 g/cm2 DualFemur Total Mean 03/21/2018 83.8 Osteopenia -2.4 0.758 g/cm2 DualFemur Total Mean 11/17/2015 81.5 Osteopenia -2.4 0.762 g/cm2 DualFemur Total Mean 11/14/2013 79.5 Osteoporosis -2.6 0.730 g/cm2 Right Forearm Radius 33% 03/21/2018 83.8 Osteopenia -1.7 0.824 g/cm2 Right Forearm Radius 33% 11/14/2013 79.5 Osteopenia -2.2 0.769 g/cm2 World Health Organization [facility]) criteria for post-menopausal, Caucasian Women: Normal:       T-score at or above -1 SD Osteopenia:   T-score between -1 and -2.5 SD Osteoporosis: T-score at or below -2.5 SD RECOMMENDATIONS: 1. All patients should optimize calcium and vitamin D intake. 2. Consider FDA-approved medical therapies in postmenopausal women and men aged 28  years and older, based on the following: a. A hip or vertebral(clinical or morphometric) fracture b. T-score < -2.5 at the femoral neck or spine after appropriate evaluation to exclude secondary causes c. Low bone mass (T-score between -1.0 and -2.5 at the femoral neck or spine) and a 10-year probability of a hip fracture > 3% or a 10-year probability of a major osteoporosis-related fracture > 20% based on the [place]-adapted WHO algorithm d. Clinician judgment and/or patient preferences may indicate treatment for people with 10-year fracture probabilities above or below these levels FOLLOW-UP: People with diagnosed cases of osteoporosis or osteopenia should be regularly tested for bone mineral density. For patients eligible for Medicare, routine testing is allowed once every 2 years. The testing frequency can be  increased to one year for patients who have rapidly progressing disease, or for those who are receiving medical therapy to restore bone mass. I have reviewed this report, and agree with the above findings. [facility] Electronically Signed   By: Lowella Grip III M.D.   On: 03/21/2018 10:49    Assessment & Plan:   Problem List Items Addressed This Visit    Dysphagia due to and not concurrent with nontraumatic subarachnoid hemorrhage    Secondary to inflammation vs stricture.   Continue PPI bid,  Esophageal barium swallow study ordered       Change in bowel function    New onset constipation with bloating .  He has no known history of diverticulosis and his bowels are moving daily.  Plain films,  ESR,  CBC today suggest constipation. Will treat with Go Lytely.   Lab Results  Component Value Date   ESRSEDRATE 13 08/01/2018   Lab Results  Component Value Date   WBC 5.3 08/01/2018   HGB 14.6 08/01/2018   HCT 42.7 08/01/2018   MCV 90.4 08/01/2018   PLT 166.0 08/01/2018   Lab Results  Component Value Date   NA 138 08/01/2018   K 4.1 08/01/2018   CL 99 08/01/2018   CO2 32 08/01/2018           Other Visit Diagnoses    LLQ pain    -  Primary   Relevant Orders   DG Abd 1 View   Sedimentation rate (Completed)   CBC with Differential/Platelet (Completed)   Pharyngoesophageal dysphagia       Relevant Orders   DG ESOPHAGUS W SINGLE CM (SOL OR THIN BA)      I am having George Owens start on polyethylene glycol. I am also having him maintain his GLUCOSAMINE CHONDR 1500 COMPLX, loratadine, Calcium Carb-Cholecalciferol (CALCIUM 1000 + D PO), Vitamin D3, Multiple Vitamins-Minerals (PRESERVISION AREDS PO), naproxen, Denosumab (PROLIA [place]), Ranibizumab (LUCENTIS IO), vitamin B-12, tiZANidine, neomycin-polymyxin b-dexamethasone, and omeprazole.  Meds ordered this encounter  Medications  . polyethylene glycol (GOLYTELY) 236 g solution    Sig: Take 4,000 mLs by mouth  once for 1 dose.    Dispense:  4000 mL    Refill:  0    There are no discontinued medications.  Follow-up: No follow-ups on file.   Crecencio Mc, MD

## 2018-08-02 ENCOUNTER — Telehealth: Payer: Self-pay | Admitting: Internal Medicine

## 2018-08-02 DIAGNOSIS — R2689 Other abnormalities of gait and mobility: Secondary | ICD-10-CM | POA: Diagnosis not present

## 2018-08-02 DIAGNOSIS — M6281 Muscle weakness (generalized): Secondary | ICD-10-CM | POA: Diagnosis not present

## 2018-08-02 DIAGNOSIS — M5441 Lumbago with sciatica, right side: Secondary | ICD-10-CM | POA: Diagnosis not present

## 2018-08-02 DIAGNOSIS — M25551 Pain in right hip: Secondary | ICD-10-CM | POA: Diagnosis not present

## 2018-08-02 NOTE — Telephone Encounter (Signed)
He should've taken the bowel prep per my instructions,  The magnesium was supposed to be started THE DAY AFTER he takes the bowel prep / But if he has taken magnesium supplement before and it caused this do not take it. After the bowel prep he can resume a stool softener (colace 100 mg ) plus citrucel

## 2018-08-02 NOTE — Telephone Encounter (Signed)
Copied from [place] #[id number]. >> Aug 02, 2018  1:21 PM Andria Frames L wrote: Pt called and stated that magnesium supplement was suggested to him Dr Derrel Nip. Pt states that magnesium made him dizzy and weak. Pt would like to know if he should still try this or if he should try something else. Please advise

## 2018-08-02 NOTE — Telephone Encounter (Signed)
Copied from [place] [phone]. Topic: Quick Communication - See Telephone Encounter >> Aug 02, 2018  1:21 PM Rayann Heman wrote: Pt called and stated that magnesium supplement was suggested to him Dr Derrel Nip. Pt states that magnesium made him dizzy and weak. Pt would like to know if he should still try this or if he should try something else. Please advise

## 2018-08-06 DIAGNOSIS — M25551 Pain in right hip: Secondary | ICD-10-CM | POA: Diagnosis not present

## 2018-08-06 DIAGNOSIS — Z961 Presence of intraocular lens: Secondary | ICD-10-CM | POA: Diagnosis not present

## 2018-08-06 DIAGNOSIS — M6281 Muscle weakness (generalized): Secondary | ICD-10-CM | POA: Diagnosis not present

## 2018-08-06 DIAGNOSIS — R2689 Other abnormalities of gait and mobility: Secondary | ICD-10-CM | POA: Diagnosis not present

## 2018-08-06 DIAGNOSIS — M5441 Lumbago with sciatica, right side: Secondary | ICD-10-CM | POA: Diagnosis not present

## 2018-08-06 NOTE — Telephone Encounter (Signed)
Patient would like to know if this information can be sent to him through his my chart and also his imaging results from 08/01/2018

## 2018-08-06 NOTE — Telephone Encounter (Signed)
Patient is requesting a my chart message be sent to him about this

## 2018-08-06 NOTE — Telephone Encounter (Signed)
I cannot view his x-ray result within Dr. Lupita Dawn in basket and thus cannot send him the results through my chart.  Please do inform him that the x-ray revealed a nonobstructive bowel gas pattern with no abnormal distention.  There was mild formed stool burden in the right colon.  Thanks.

## 2018-08-06 NOTE — Telephone Encounter (Addendum)
Pt would like the results of his abdominal xray to be sent to him through mychart. Xray has not been reviewed. Other information has already been sent via mychart.

## 2018-08-07 NOTE — Telephone Encounter (Signed)
Spoke with George Owens and informed him of his xray results. George Owens gave a verbal understanding and stated that Dr. Derrel Nip had sent him a message stating that the xray did show constipation. George Owens stated that he has started taking the citrucel and his bowels have returned to normal.

## 2018-08-09 DIAGNOSIS — M25551 Pain in right hip: Secondary | ICD-10-CM | POA: Diagnosis not present

## 2018-08-09 DIAGNOSIS — R2689 Other abnormalities of gait and mobility: Secondary | ICD-10-CM | POA: Diagnosis not present

## 2018-08-09 DIAGNOSIS — M6281 Muscle weakness (generalized): Secondary | ICD-10-CM | POA: Diagnosis not present

## 2018-08-09 DIAGNOSIS — M5441 Lumbago with sciatica, right side: Secondary | ICD-10-CM | POA: Diagnosis not present

## 2018-08-13 DIAGNOSIS — M25551 Pain in right hip: Secondary | ICD-10-CM | POA: Diagnosis not present

## 2018-08-13 DIAGNOSIS — R2689 Other abnormalities of gait and mobility: Secondary | ICD-10-CM | POA: Diagnosis not present

## 2018-08-13 DIAGNOSIS — M6281 Muscle weakness (generalized): Secondary | ICD-10-CM | POA: Diagnosis not present

## 2018-08-13 DIAGNOSIS — M5441 Lumbago with sciatica, right side: Secondary | ICD-10-CM | POA: Diagnosis not present

## 2018-08-14 ENCOUNTER — Telehealth: Payer: Self-pay

## 2018-08-14 NOTE — Telephone Encounter (Signed)
Copied from [place] [phone]. Topic: General - Inquiry >> Aug 14, 2018  8:23 AM George Owens wrote: Reason for CRM: Pt is calling about the in office swallowing test that was to be scheduled by Melissa/ please advise

## 2018-08-16 ENCOUNTER — Ambulatory Visit: Payer: PPO

## 2018-08-16 DIAGNOSIS — M5441 Lumbago with sciatica, right side: Secondary | ICD-10-CM | POA: Diagnosis not present

## 2018-08-16 DIAGNOSIS — M25551 Pain in right hip: Secondary | ICD-10-CM | POA: Diagnosis not present

## 2018-08-16 DIAGNOSIS — M6281 Muscle weakness (generalized): Secondary | ICD-10-CM | POA: Diagnosis not present

## 2018-08-16 DIAGNOSIS — R2689 Other abnormalities of gait and mobility: Secondary | ICD-10-CM | POA: Diagnosis not present

## 2018-08-17 ENCOUNTER — Other Ambulatory Visit: Payer: Self-pay | Admitting: Internal Medicine

## 2018-08-17 ENCOUNTER — Ambulatory Visit
Admission: RE | Admit: 2018-08-17 | Discharge: 2018-08-17 | Disposition: A | Discharge status: Home / Self Care | Payer: PPO | Source: Ambulatory Visit | Attending: Internal Medicine | Admitting: Internal Medicine

## 2018-08-17 DIAGNOSIS — R1314 Dysphagia, pharyngoesophageal phase: Secondary | ICD-10-CM | POA: Insufficient documentation

## 2018-08-17 DIAGNOSIS — K225 Diverticulum of esophagus, acquired: Secondary | ICD-10-CM | POA: Diagnosis not present

## 2018-08-23 ENCOUNTER — Telehealth: Payer: Self-pay | Admitting: Internal Medicine

## 2018-08-23 NOTE — Telephone Encounter (Signed)
Patient returned call-  Notified of PCP results and recommendation. He wants to give it some time and see how he does- if things do not improve- than he will call back for referral.

## 2018-08-23 NOTE — Telephone Encounter (Signed)
I don't see this in the chart.

## 2018-08-23 NOTE — Telephone Encounter (Signed)
There was no stricture or abnormal pattern of contraction to explain his trouble swallowing pills .  He has a small Zencker's diverticulum, which is a tiny pouch that can collect food at times,  But it does not interfere with swallowing.  If he would like to see a gastroenterologist for furhter evaluation with endoscopy,  I will arrange.

## 2018-08-23 NOTE — Telephone Encounter (Signed)
Left message with pt's wife to have pt give [place] a call back. PEC may speak with pt.

## 2018-08-23 NOTE — Telephone Encounter (Signed)
Copied from [place] [id number]. Topic: Quick Communication - See Telephone Encounter >> Aug 23, 2018 10:53 AM Bea Graff, NT wrote: CRM for notification. See Telephone encounter for: 08/23/18. Pt would like a call to discuss his results from his swallowing test last week.

## 2018-09-18 ENCOUNTER — Telehealth: Payer: Self-pay

## 2018-09-18 NOTE — Telephone Encounter (Signed)
Copied from [place] [phone]. Topic: Referral - Question >> Sep 18, 2018  1:38 PM Reyne Dumas L wrote: Reason for CRM:   Pt called and left message on [place].  States he would like a return phone call concerning referrals. Pt can be reached at [phone]

## 2018-09-19 DIAGNOSIS — H353211 Exudative age-related macular degeneration, right eye, with active choroidal neovascularization: Secondary | ICD-10-CM | POA: Diagnosis not present

## 2018-09-20 NOTE — Telephone Encounter (Signed)
Spoke with pt and he is having concerns about the swallowing test not showing anything and how he is still having some issues and wanting to know if a referral is needed to a GI doctor. Pt has been scheduled for a telephone visit with Dr. Derrel Nip for tomorrow at  [place] is aware of appt date and time.

## 2018-09-21 ENCOUNTER — Ambulatory Visit (INDEPENDENT_AMBULATORY_CARE_PROVIDER_SITE_OTHER): Payer: PPO | Admitting: Internal Medicine

## 2018-09-21 DIAGNOSIS — R1314 Dysphagia, pharyngoesophageal phase: Secondary | ICD-10-CM

## 2018-09-21 DIAGNOSIS — K5900 Constipation, unspecified: Secondary | ICD-10-CM | POA: Diagnosis not present

## 2018-09-21 DIAGNOSIS — R198 Other specified symptoms and signs involving the digestive system and abdomen: Secondary | ICD-10-CM | POA: Diagnosis not present

## 2018-09-21 DIAGNOSIS — Z86010 Personal history of colonic polyps: Secondary | ICD-10-CM

## 2018-09-21 NOTE — Progress Notes (Signed)
Virtual Visit via Telephone Note  I connected with George Owens on 09/23/18 at  8:00 AM EDT by telephone and verified that I am speaking with the correct person using two identifiers.   I discussed the limitations, risks, security and privacy concerns of performing an evaluation and management service by telephone and the availability of in person appointments. I also discussed with the patient that there may be a patient responsible charge related to this service. The patient expressed understanding and agreed to proceed.   History of Present Illness:  Follow up on dysphagia and change in bowel habits.   85 yr old white male with history of reflux and remote esophageal stricture   presented with dysphagia for large pills on Feb 12 after burning his throat on hot vegetable soup.  Symptoms have not improved with bid use of a PPI for the past 6 weeks. He had an esophageal barium swallow which noted no strictures of achalasia,  Just a  zenker's diverticulum .  Still having trouble swallowing larger pills, including calcium and glucosamine.  Feels that the stricture is lower down,  Near the GES junction.  Described  The feeling as a discomfort , not pain, that only resolves if he swallows a bolus of food, does not resolve with drinking water.  He has had to vomit at times to resolve the feeling and as noted tablets in his vomit.  2) follow up on new onset constipation now described as "severe"  Accompanied by  post prandial bloating without abd pain but with unintentional weight loss of 5 lbs.  He has been taking  colace and citrucel nightly to keep stools from becoming hard.  He feels that the constipation is due to rectal muscle weakness because he notes that even when his stools are soft , he cannot  Expel them  from rectum.   Bloating has resolved.     Observations/Objective:  General: patient sounds anxious but not in distress. He is oriented x 3  Speech is non pressured, articulate and logical     Assessment and Plan:   Follow Up Instructions:    I discussed the assessment and treatment plan with the patient. The patient was provided an opportunity to ask questions and all were answered. The patient agreed with the plan and demonstrated an understanding of the instructions.   The patient was advised to call back or seek an in-person evaluation if the symptoms worsen or if the condition fails to improve as anticipated.  I provided 25 minutes of non-face-to-face time during this encounter.   Crecencio Mc, MD

## 2018-09-23 ENCOUNTER — Encounter: Payer: Self-pay | Admitting: Internal Medicine

## 2018-09-23 NOTE — Assessment & Plan Note (Signed)
Barium swallow was unrevealing but he as persistent symptoms  Of mechanical obstruction at the GE junction brought on by swallowing large  Pills.  Referral for EGD

## 2018-09-23 NOTE — Assessment & Plan Note (Signed)
Referring to [facility]red for diagnostic colonoscopy given current history of new onset constipation , weight loss

## 2018-09-23 NOTE — Assessment & Plan Note (Signed)
Referring to [facility]

## 2018-10-03 ENCOUNTER — Telehealth: Payer: Self-pay | Admitting: Internal Medicine

## 2018-10-03 NOTE — Telephone Encounter (Signed)
Insurance verification for Prolia filed on [employer] Portal. 

## 2018-10-04 ENCOUNTER — Ambulatory Visit (INDEPENDENT_AMBULATORY_CARE_PROVIDER_SITE_OTHER): Payer: PPO

## 2018-10-04 ENCOUNTER — Other Ambulatory Visit: Payer: Self-pay

## 2018-10-04 DIAGNOSIS — M818 Other osteoporosis without current pathological fracture: Secondary | ICD-10-CM | POA: Diagnosis not present

## 2018-10-04 MED ORDER — DENOSUMAB 60 MG/ML ~~LOC~~ SOSY
60.0000 mg | PREFILLED_SYRINGE | Freq: Once | SUBCUTANEOUS | Status: AC
  Administered 2018-10-04: 60 mg via SUBCUTANEOUS

## 2018-10-04 NOTE — Progress Notes (Signed)
Patient presented today for Prolia injection.  Administered SubC in right arm. Patient tolerated well.

## 2018-10-04 NOTE — Telephone Encounter (Signed)
Prolia approved appointment scheduled.

## 2018-11-19 ENCOUNTER — Encounter: Payer: Self-pay | Admitting: *Deleted

## 2018-11-20 ENCOUNTER — Encounter: Payer: Self-pay | Admitting: Internal Medicine

## 2018-11-20 ENCOUNTER — Other Ambulatory Visit: Payer: Self-pay

## 2018-11-20 ENCOUNTER — Ambulatory Visit (INDEPENDENT_AMBULATORY_CARE_PROVIDER_SITE_OTHER): Payer: PPO | Admitting: Internal Medicine

## 2018-11-20 VITALS — Ht 76.0 in | Wt 189.0 lb

## 2018-11-20 DIAGNOSIS — K225 Diverticulum of esophagus, acquired: Secondary | ICD-10-CM

## 2018-11-20 DIAGNOSIS — R1310 Dysphagia, unspecified: Secondary | ICD-10-CM | POA: Diagnosis not present

## 2018-11-20 DIAGNOSIS — R1012 Left upper quadrant pain: Secondary | ICD-10-CM

## 2018-11-20 NOTE — Progress Notes (Signed)
Patient ID: George Owens, male   DOB: 12/29/1931, 85 y.o.   MRN: [id number]   This service was provided via telemedicine.  Telephone encounter The patient was located at home The provider was located in provider's GI office. The patient did consent to this telephone visit and is aware of possible charges through their insurance for this visit.   The persons participating in this telemedicine service were the patient and I. Time spent on call: 25 minutes  HPI: George Owens is an 85 year old male with a past medical history of GERD, history of prostate cancer, osteoporosis who seen in consult in the request of Dr. Derrel Nip to evaluate left upper abdominal pain and also trouble swallowing.  He is seen virtually today in the setting of COVID-19 pandemic.  He reports that in the last 6 months or so he developed a "aggravating discomfort" in his left upper quadrant.  He notices mostly while sitting such as when he would play cards.  Again he reports this as uncomfortable but not a pain.  Is below his ribs in the left upper abdomen.  He was started on omeprazole 40 mg and it helped this pain tremendously.  He reports generally he is not feeling it.  If he does feel it he blames it on intermittent changes in diet and he can take "an extra Prilosec" and it will go away.  He has not had heartburn or acid indigestion.  Given that this has improved to this extent he reports it is no longer his biggest concern.  His swallowing is currently his most troublesome GI symptom.  He reports that he has had more and more trouble swallowing pills.  This dates back to December 2019 when he ate soup that was too hot.  He states that he swallowed it quickly and then had burning in his upper esophagus and throat region.  He remembers vegetable getting caught in this area remains sore for 2 or 3 days.  Gradually since this time he has had more more problems swallowing pills and now times even swallowing solid foods can be  intermittently troublesome.  Sometimes the larger pills will catch and he will have to bring them back up.  No odynophagia, other than when he ate the hot food 6 months ago.  No nausea or vomiting.  He reports his bowel movements is regular though he has added a fiber supplement because at times he was having some incomplete evacuation and constipation.  He recalls his last colonoscopy being in 2009 in [place].  He does not recall polyps at that time.  He wonders if he needs another colonoscopy.  No family history of GI tract malignancy.  He is originally from [facility] though he lived the [place] of his life in [place].  He moved to [place] 8 years ago after getting married again.  Past Medical History:  Diagnosis Date  . Allergy   . Chicken pox   . Colon polyps   . GERD (gastroesophageal reflux disease)   . history of prostate CA 2006  . Hyperlipidemia   . Leg cramps   . Neuropathy   . Osteoporosis     Past Surgical History:  Procedure Laterality Date  . CATARACT EXTRACTION Right   . CATARACT EXTRACTION W/PHACO Left 12/26/2017   Procedure: CATARACT EXTRACTION PHACO AND INTRAOCULAR LENS PLACEMENT ([facility])  COMPLICATED LEFT;  Surgeon: Leandrew Koyanagi, MD;  Location: [facility];  Service: Ophthalmology;  Laterality: Left;  [place]  .  EYE MUSCLE SURGERY Left   . TONSILLECTOMY AND ADENOIDECTOMY      Outpatient Medications Prior to Visit  Medication Sig Dispense Refill  . Calcium Carb-Cholecalciferol (CALCIUM 1000 + D PO) Take 1 tablet by mouth daily.     . Cholecalciferol (VITAMIN D3) 400 UNITS CAPS Take 1 capsule by mouth daily.    . Denosumab (PROLIA [place]) Inject into the skin every 6 (six) months.    . Glucosamine-Chondroit-Vit C-Mn (GLUCOSAMINE CHONDR 1500 COMPLX) CAPS Take 1 capsule by mouth daily.     [name] loratadine (CLARITIN) 10 MG tablet Take 10 mg by mouth daily as needed.     . Multiple Vitamins-Minerals (PRESERVISION AREDS PO)  Take 2 capsules by mouth daily.    . naproxen (NAPROSYN) 250 MG tablet Take 250 mg by mouth as needed.    . neomycin-polymyxin b-dexamethasone (MAXITROL) 3.5-10000-0.1 OINT     . omeprazole (PRILOSEC) 40 MG capsule Take 40 mg by mouth daily.     . Ranibizumab (LUCENTIS IO) Inject into the eye.    . vitamin B-12 (CYANOCOBALAMIN) 1000 MCG tablet Take 1,000 mcg by mouth daily.     No facility-administered medications prior to visit.     Allergies  Allergen Reactions  . Kenalog [Triamcinolone Acetonide]     Blindness X 3 Days    Family History  Problem Relation Age of Onset  . Stroke Mother   . Arthritis Father   . Heart disease Father   . Brain cancer Brother        1/2 brother (possibly related to mother NOT related to patient)  . Colon cancer Neg Hx   . Esophageal cancer Neg Hx   . Pancreatic cancer Neg Hx   . Stomach cancer Neg Hx   . Liver disease Neg Hx     Social History   Tobacco Use  . Smoking status: Never Smoker  . Smokeless tobacco: Never Used  Substance Use Topics  . Alcohol use: No  . Drug use: No    ROS: As per history of present illness, otherwise negative  Ht 6\' 4"  (1.93 m)   Wt 189 lb (85.7 kg)   BMI 23.01 kg/m  No physical exam, virtual visit  RELEVANT LABS AND IMAGING: CBC    Component Value Date/Time   WBC 5.3 08/01/2018 1427   RBC 4.72 08/01/2018 1427   HGB 14.6 08/01/2018 1427   HGB 14.0 05/29/2014 0917   HCT 42.7 08/01/2018 1427   HCT 41.9 05/29/2014 0917   PLT 166.0 08/01/2018 1427   PLT 134 (L) 05/29/2014 0917   MCV 90.4 08/01/2018 1427   MCV 91 05/29/2014 0917   MCH 30.1 05/29/2014 0917   MCHC 34.2 08/01/2018 1427   RDW 13.4 08/01/2018 1427   RDW 13.4 05/29/2014 0917   LYMPHSABS 1.7 08/01/2018 1427   LYMPHSABS 1.2 05/29/2014 0917   MONOABS 0.4 08/01/2018 1427   MONOABS 0.3 05/29/2014 0917   EOSABS 0.1 08/01/2018 1427   EOSABS 0.1 05/29/2014 0917   BASOSABS 0.0 08/01/2018 1427   BASOSABS 0.0 05/29/2014 0917    CMP      Component Value Date/Time   NA 138 08/01/2018 1427   NA 145 05/29/2014 0917   K 4.1 08/01/2018 1427   K 3.6 05/29/2014 0917   CL 99 08/01/2018 1427   CL 105 05/29/2014 0917   CO2 32 08/01/2018 1427   CO2 33 (H) 05/29/2014 0917   GLUCOSE 102 (H) 08/01/2018 1427   GLUCOSE 116 (H) 05/29/2014 7673  BUN 20 08/01/2018 1427   BUN 20 (H) 05/29/2014 0917   CREATININE 0.90 08/01/2018 1427   CREATININE 0.89 12/25/2017 0805   CALCIUM 9.4 08/01/2018 1427   CALCIUM 8.3 (L) 05/29/2014 0917   PROT 7.0 08/01/2018 1427   PROT 6.5 05/29/2014 0917   ALBUMIN 4.4 08/01/2018 1427   ALBUMIN 3.5 05/29/2014 0917   AST 24 08/01/2018 1427   AST 30 05/29/2014 0917   ALT 22 08/01/2018 1427   ALT 47 05/29/2014 0917   ALKPHOS 41 08/01/2018 1427   ALKPHOS 57 05/29/2014 0917   BILITOT 0.7 08/01/2018 1427   BILITOT 0.6 05/29/2014 0917   GFRNONAA >60 05/29/2014 0917   GFRNONAA >60 12/05/2013 1244   GFRAA >60 05/29/2014 0917   GFRAA >60 12/05/2013 1244    ASSESSMENT/PLAN:  85 year old male with a past medical history of GERD, history of prostate cancer, osteoporosis who seen in consult in the request of Dr. Derrel Nip to evaluate left upper abdominal pain and also trouble swallowing.  1. LUQ pain --improved with Prilosec and now resolved.  This argues for a gastritis or dyspeptic etiology.  He will continue omeprazole 40 mg daily.  2.  Pill dysphagia/abnormal barium esophagram --symptoms started after eating a hot meal, query if he had esophagitis at that time caused by heat induced injury and developing a stricture in the proximal esophagus.  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  We discussed how his symptoms could be related to the Zenker's diverticulum slowly but could also be due to a proximal esophageal stricture.  The barium esophagram did not show overt stricture, motility disorder or mass.  I have recommended upper endoscopy and possible  dilation, though we also discussed that if symptoms are solely Zenker's related dilation would not help symptoms and he may need to consider a Zenker's diverticulotomy (which would be at [facility] with GI or ENT surgery). --Upper endoscopy with possible dilation; we discussed the risk, benefits and alternatives and he is agreeable and wishes to proceed  3.  CRC screening --we discussed that colonoscopy generally stops around age 69.  However he is very healthy and we could consider repeat screening.  He wishes to think more about this and discuss it later.  We will not proceed to colonoscopy at the same time as his upper endoscopy   WN:IOEVO, Aris Everts, Md [address] [place], [place] [address]

## 2018-11-20 NOTE — Patient Instructions (Addendum)
You have been scheduled for an endoscopy on 11/29/2018. Please follow written instructions given to you at your visit today. If you use inhalers (even only as needed), please bring them with you on the day of your procedure.

## 2018-11-20 NOTE — Addendum Note (Signed)
Addended by: Larina Bras on: 11/20/2018 03:46 PM   Modules accepted: Orders

## 2018-11-21 ENCOUNTER — Encounter: Payer: Self-pay | Admitting: Internal Medicine

## 2018-11-23 ENCOUNTER — Telehealth: Payer: Self-pay | Admitting: Internal Medicine

## 2018-11-23 NOTE — Telephone Encounter (Signed)
Spoke with patient who was concerned about coverage/precert for his upcoming egd.  I talked with Morton Amy who confirmed that his insurance only required $175 copay for which he would be billed.  He requested the CPT code for his egd so he could clarify some things with his insurance company.  Patient also asking if his care partnershould come up with him when he registered.  I told him to bring the partner up with him to register and they would tell him exactly where the care partner should wait during his procedure.  Patient acknowledged and understood.

## 2018-11-23 NOTE — Telephone Encounter (Signed)
Pt would like a call to go over prep instructions for upcoming EGD on Thurs.

## 2018-11-28 ENCOUNTER — Telehealth: Payer: Self-pay | Admitting: *Deleted

## 2018-11-28 DIAGNOSIS — H353223 Exudative age-related macular degeneration, left eye, with inactive scar: Secondary | ICD-10-CM | POA: Diagnosis not present

## 2018-11-28 DIAGNOSIS — H353211 Exudative age-related macular degeneration, right eye, with active choroidal neovascularization: Secondary | ICD-10-CM | POA: Diagnosis not present

## 2018-11-28 NOTE — Telephone Encounter (Signed)

## 2018-11-29 ENCOUNTER — Encounter: Payer: Self-pay | Admitting: Internal Medicine

## 2018-11-29 ENCOUNTER — Other Ambulatory Visit: Payer: Self-pay

## 2018-11-29 ENCOUNTER — Ambulatory Visit (AMBULATORY_SURGERY_CENTER): Payer: PPO | Admitting: Internal Medicine

## 2018-11-29 VITALS — BP 109/58 | HR 61 | Temp 97.5°F | Resp 13 | Ht 76.0 in | Wt 189.0 lb

## 2018-11-29 DIAGNOSIS — K225 Diverticulum of esophagus, acquired: Secondary | ICD-10-CM | POA: Diagnosis not present

## 2018-11-29 DIAGNOSIS — K317 Polyp of stomach and duodenum: Secondary | ICD-10-CM

## 2018-11-29 DIAGNOSIS — R1310 Dysphagia, unspecified: Secondary | ICD-10-CM

## 2018-11-29 MED ORDER — SODIUM CHLORIDE 0.9 % IV SOLN: 500.0000 mL | Freq: Once | INTRAVENOUS | Status: DC

## 2018-11-29 NOTE — Progress Notes (Signed)
Called to room to assist during endoscopic procedure.  Patient ID and intended procedure confirmed with present staff. Received instructions for my participation in the procedure from the performing physician.  

## 2018-11-29 NOTE — Patient Instructions (Signed)
YOU HAD AN ENDOSCOPIC PROCEDURE TODAY AT THE [facility]:   Refer to the procedure report that was given to you for any specific questions about what was found during the examination.  If the procedure report does not answer your questions, please call your gastroenterologist to clarify.  If you requested that your care partner not be given the details of your procedure findings, then the procedure report has been included in a sealed envelope for you to review at your convenience later.  YOU SHOULD EXPECT: Some feelings of bloating in the abdomen. Passage of more gas than usual.  Walking can help get rid of the air that was put into your GI tract during the procedure and reduce the bloating. If you had a lower endoscopy (such as a colonoscopy or flexible sigmoidoscopy) you may notice spotting of blood in your stool or on the toilet paper. If you underwent a bowel prep for your procedure, you may not have a normal bowel movement for a few days.  Please Note:  You might notice some irritation and congestion in your nose or some drainage.  This is from the oxygen used during your procedure.  There is no need for concern and it should clear up in a day or so.  SYMPTOMS TO REPORT IMMEDIATELY:   Following upper endoscopy (EGD)  Vomiting of blood or coffee ground material  New chest pain or pain under the shoulder blades  Painful or persistently difficult swallowing  New shortness of breath  Fever of 100F or higher  Black, tarry-looking stools  For urgent or emergent issues, a gastroenterologist can be reached at any hour by calling [phone].   DIET:  We do recommend a small meal at first, but then you may proceed to your regular diet.  Drink plenty of fluids but you should avoid alcoholic beverages for 24 hours.  ACTIVITY:  You should plan to take it easy for the rest of today and you should NOT DRIVE or use heavy machinery until tomorrow (because of the sedation medicines used  during the test).    FOLLOW UP: Our staff will call the number listed on your records 48-72 hours following your procedure to check on you and address any questions or concerns that you may have regarding the information given to you following your procedure. If we do not reach you, we will leave a message.  We will attempt to reach you two times.  During this call, we will ask if you have developed any symptoms of COVID 19. If you develop any symptoms (ie: fever, flu-like symptoms, shortness of breath, cough etc.) before then, please call [phone].  If you test positive for Covid 19 in the 2 weeks post procedure, please call and report this information to us.    If any biopsies were taken you will be contacted by phone or by letter within the next 1-3 weeks.  Please call us at [phone] if you have not heard about the biopsies in 3 weeks.    SIGNATURES/CONFIDENTIALITY: You and/or your care partner have signed paperwork which will be entered into your electronic medical record.  These signatures attest to the fact that that the information above on your After Visit Summary has been reviewed and is understood.  Full responsibility of the confidentiality of this discharge information lies with you and/or your care-partner. 

## 2018-11-29 NOTE — Progress Notes (Signed)
PT taken to PACU. Monitors in place. VSS. Report given to RN. 

## 2018-11-29 NOTE — Op Note (Signed)
[facility] Patient Name: George Owens Procedure Date: 11/29/2018 11:22 AM MRN: [id number] Endoscopist: Jerene Bears , MD Age: 85 Referring MD:  Date of Birth: Jul 05, 1931 Gender: Male Account #: [id number] Procedure:                Upper GI endoscopy Indications:              Dysphagia (pill related), Abnormal cine-esophagram                            (Zenker's diverticulum) Medicines:                Monitored Anesthesia Care Procedure:                Pre-Anesthesia Assessment:                           - Prior to the procedure, a History and Physical                            was performed, and patient medications and                            allergies were reviewed. The patient's tolerance of                            previous anesthesia was also reviewed. The risks                            and benefits of the procedure and the sedation                            options and risks were discussed with the patient.                            All questions were answered, and informed consent                            was obtained. Prior Anticoagulants: The patient has                            taken no previous anticoagulant or antiplatelet                            agents. ASA Grade Assessment: II - A patient with                            mild systemic disease. After reviewing the risks                            and benefits, the patient was deemed in                            satisfactory condition to undergo the procedure.  After obtaining informed consent, the endoscope was                            passed under direct vision. Throughout the                            procedure, the patient's blood pressure, pulse, and                            oxygen saturations were monitored continuously. The                            Model GIF-[id number]) scope was introduced                            through the mouth, and advanced  to the second part                            of duodenum. The upper GI endoscopy was                            accomplished without difficulty. The patient                            tolerated the procedure well. Scope In: Scope Out: Findings:                 The examined esophagus was normal. The esophagus                            was intubated without resistance. The Zenker's                            diverticulum seen on barium esophagram was not                            easily apparent today.                           No endoscopic abnormality was evident in the                            esophagus to explain the patient's complaint of                            dysphagia. It was decided, however, to proceed with                            dilation of the entire esophagus. A guidewire was                            placed and the scope was withdrawn. Dilation was                            attempted with  Savary 17 mm, but resistance was met                            and thus the dilation attempt was aborted given                            known Zenker's diverticulum and desire to avoid                            potential complication.                           Multiple small (2-6 mm) sessile polyps with no                            bleeding were found in the cardia, in the gastric                            fundus and in the gastric body. Polyps are benign                            appearing. Biopsies were taken with a cold forceps                            from multiple polyps for sampling and for histology.                           The examined duodenum was normal. Complications:            No immediate complications. Estimated Blood Loss:     Estimated blood loss was minimal. Impression:               - Normal esophagus.                           - No endoscopic esophageal abnormality to explain                            patient's dysphagia. Dilation attempted, but                             aborted due to resistance with Savary and known                            Zenker's diverticulum.                           - Multiple gastric polyps. Biopsied.                           - Normal examined duodenum. Recommendation:           - Patient has a contact number available for                            emergencies. The signs and symptoms of  potential                            delayed complications were discussed with the                            patient. Return to normal activities tomorrow.                            Written discharge instructions were provided to the                            patient.                           - Resume previous diet.                           - Continue present medications.                           - Await pathology results.                           - If dysphagia symptoms worsen we could consider                            referral for evaluation of Zenker's and to discuss                            possible endoscopic treatment. Jerene Bears, MD 11/29/2018 12:04:11 PM This report has been signed electronically.

## 2018-12-03 ENCOUNTER — Telehealth: Payer: Self-pay

## 2018-12-03 ENCOUNTER — Encounter: Payer: Self-pay | Admitting: Internal Medicine

## 2018-12-03 NOTE — Telephone Encounter (Signed)
  Follow up Call-  Call back number 11/29/2018  Post procedure Call Back phone  # [phone]  Permission to leave phone message Yes  Some recent data might be hidden     Patient questions:  Do you have a fever, pain , or abdominal swelling? No. Pain Score  0 *  Have you tolerated food without any problems? Yes.    Have you been able to return to your normal activities? Yes.    Do you have any questions about your discharge instructions: Diet   No. Medications  No. Follow up visit  No.  Do you have questions or concerns about your Care? No.  Actions: * If pain score is 4 or above: 1. No action needed, pain <4.Have you developed a fever since your procedure? no  2.   Have you had an respiratory symptoms (SOB or cough) since your procedure? no  3.   Have you tested positive for COVID 19 since your procedure no  4.   Have you had any family members/close contacts diagnosed with the COVID 19 since your procedure?  no   If yes to any of these questions please route to Joylene Eldred, RN and Alphonsa Gin, Therapist, sports.

## 2019-01-09 DIAGNOSIS — Z85828 Personal history of other malignant neoplasm of skin: Secondary | ICD-10-CM | POA: Diagnosis not present

## 2019-01-09 DIAGNOSIS — Z08 Encounter for follow-up examination after completed treatment for malignant neoplasm: Secondary | ICD-10-CM | POA: Diagnosis not present

## 2019-01-09 DIAGNOSIS — C44319 Basal cell carcinoma of skin of other parts of face: Secondary | ICD-10-CM | POA: Diagnosis not present

## 2019-01-09 DIAGNOSIS — L821 Other seborrheic keratosis: Secondary | ICD-10-CM | POA: Diagnosis not present

## 2019-01-09 DIAGNOSIS — X32XXXA Exposure to sunlight, initial encounter: Secondary | ICD-10-CM | POA: Diagnosis not present

## 2019-01-09 DIAGNOSIS — D2261 Melanocytic nevi of right upper limb, including shoulder: Secondary | ICD-10-CM | POA: Diagnosis not present

## 2019-01-09 DIAGNOSIS — D2271 Melanocytic nevi of right lower limb, including hip: Secondary | ICD-10-CM | POA: Diagnosis not present

## 2019-01-09 DIAGNOSIS — D225 Melanocytic nevi of trunk: Secondary | ICD-10-CM | POA: Diagnosis not present

## 2019-01-09 DIAGNOSIS — D2262 Melanocytic nevi of left upper limb, including shoulder: Secondary | ICD-10-CM | POA: Diagnosis not present

## 2019-01-09 DIAGNOSIS — D485 Neoplasm of uncertain behavior of skin: Secondary | ICD-10-CM | POA: Diagnosis not present

## 2019-01-09 DIAGNOSIS — L570 Actinic keratosis: Secondary | ICD-10-CM | POA: Diagnosis not present

## 2019-01-09 DIAGNOSIS — D2272 Melanocytic nevi of left lower limb, including hip: Secondary | ICD-10-CM | POA: Diagnosis not present

## 2019-01-18 DIAGNOSIS — C44319 Basal cell carcinoma of skin of other parts of face: Secondary | ICD-10-CM | POA: Diagnosis not present

## 2019-01-28 ENCOUNTER — Telehealth: Payer: Self-pay

## 2019-01-28 NOTE — Telephone Encounter (Signed)
Called and spoke to patient.  Pt c/o having chills and weakness that started on Saturday night.   No sinus congestion, no sinus pressure, no fever.  Stool has been looser and darker than normal starting on yesterday.    Pt has been taking Claritin and aspirin but symptoms not clearing up.  No appt available w/ PCP.  Pt scheduled virtual visit w/ Dr. Aundra Dubin on tomorrow (01/29/19) @ 11:00 am.

## 2019-01-28 NOTE — Telephone Encounter (Signed)
Copied from [place] [id number]. Topic: General - Other >> Jan 28, 2019  4:20 PM Leward Quan A wrote: Reason for CRM: Patient would like a call back to schedule a virtual visit due to a never ending sinus infection he says. Please call Ph# [phone]

## 2019-01-29 ENCOUNTER — Ambulatory Visit (INDEPENDENT_AMBULATORY_CARE_PROVIDER_SITE_OTHER): Payer: PPO | Admitting: Internal Medicine

## 2019-01-29 ENCOUNTER — Other Ambulatory Visit: Payer: Self-pay

## 2019-01-29 ENCOUNTER — Encounter: Payer: Self-pay | Admitting: Internal Medicine

## 2019-01-29 DIAGNOSIS — R531 Weakness: Secondary | ICD-10-CM

## 2019-01-29 DIAGNOSIS — Z20822 Contact with and (suspected) exposure to covid-19: Secondary | ICD-10-CM

## 2019-01-29 DIAGNOSIS — R6883 Chills (without fever): Secondary | ICD-10-CM | POA: Diagnosis not present

## 2019-01-29 DIAGNOSIS — Z20828 Contact with and (suspected) exposure to other viral communicable diseases: Secondary | ICD-10-CM | POA: Diagnosis not present

## 2019-01-29 DIAGNOSIS — R5383 Other fatigue: Secondary | ICD-10-CM | POA: Diagnosis not present

## 2019-01-29 NOTE — Progress Notes (Signed)
Virtual Visit via Video Note  I connected with George Owens   on 01/29/19 at  1:44 PM EDT by a video enabled telemedicine application and verified that I am speaking with the correct person using two identifiers.  Location patient: home Location provider:work  Persons participating in the virtual visit: patient, provider  I discussed the limitations of evaluation and management by telemedicine and the availability of in person appointments. The patient expressed understanding and agreed to proceed.   HPI: X 3-4 days c/o fatigue and weakness and reduced energy and chills w/o fever temp today 97.2 and no aspirin since breakfast. He takes Asprin 3-4 x a day normally when he gets sick. He has also tried claritin w/o relief and he normally has allergies seasonally.  He also reports sinus congestion though he can smell no covid 19 exposure, no sore throat, nasal drainage. He also c/o looser stools than normal and stool each time with urination. Denies loss of taste and smell. He wants to be tested for COVID 19 due to grand daughter + 100 miles away but  Not near her. He has been going to stores and the bank but had his mask on   ROS: See pertinent positives and negatives per HPI. Neuro: denies dizziness.   Past Medical History:  Diagnosis Date  . Allergy   . Chicken pox   . Colon polyps   . GERD (gastroesophageal reflux disease)   . history of prostate CA 2006  . Hyperlipidemia   . Leg cramps   . Neuropathy   . Osteoporosis     Past Surgical History:  Procedure Laterality Date  . CATARACT EXTRACTION Right   . CATARACT EXTRACTION W/PHACO Left 12/26/2017   Procedure: CATARACT EXTRACTION PHACO AND INTRAOCULAR LENS PLACEMENT ([place])  COMPLICATED LEFT;  Surgeon: Leandrew Koyanagi, MD;  Location: [place];  Service: Ophthalmology;  Laterality: Left;  [place]  . EYE MUSCLE SURGERY Left   . TONSILLECTOMY AND ADENOIDECTOMY      Family History  Problem Relation Age of  Onset  . Stroke Mother   . Arthritis Father   . Heart disease Father   . Brain cancer Brother        1/2 brother (possibly related to mother NOT related to patient)  . Colon cancer Neg Hx   . Esophageal cancer Neg Hx   . Pancreatic cancer Neg Hx   . Stomach cancer Neg Hx   . Liver disease Neg Hx     SOCIAL HX:  [facility]ives at [facility] [place]    Current Outpatient Medications:  .  Calcium Carb-Cholecalciferol (CALCIUM 1000 + D PO), Take 1 tablet by mouth daily. , Disp: , Rfl:  .  Cholecalciferol (VITAMIN D3) 400 UNITS CAPS, Take 1 capsule by mouth daily., Disp: , Rfl:  .  Denosumab (PROLIA [place]), Inject into the skin every 6 (six) months., Disp: , Rfl:  .  Glucosamine-Chondroit-Vit C-Mn (GLUCOSAMINE CHONDR 1500 COMPLX) CAPS, Take 1 capsule by mouth daily. , Disp: , Rfl:  .  loratadine (CLARITIN) 10 MG tablet, Take 10 mg by mouth daily as needed. , Disp: , Rfl:  .  Multiple Vitamins-Minerals (PRESERVISION AREDS PO), Take 2 capsules by mouth daily., Disp: , Rfl:  .  naproxen (NAPROSYN) 250 MG tablet, Take 250 mg by mouth as needed., Disp: , Rfl:  .  neomycin-polymyxin b-dexamethasone (MAXITROL) 3.5-10000-0.1 OINT, , Disp: , Rfl:  .  omeprazole (PRILOSEC) 40 MG capsule, Take 40 mg by mouth daily. , Disp: ,  Rfl:  .  Ranibizumab (LUCENTIS IO), Inject into the eye., Disp: , Rfl:  .  vitamin B-12 (CYANOCOBALAMIN) 1000 MCG tablet, Take 1,000 mcg by mouth daily., Disp: , Rfl:   EXAM:  VITALS per patient if applicable:  GENERAL: alert, oriented, appears well and in no acute distress  HEENT: atraumatic, conjunttiva clear, no obvious abnormalities on inspection of external nose and ears  NECK: normal movements of the head and neck  LUNGS: on inspection no signs of respiratory distress, breathing rate appears normal, no obvious gross SOB, gasping or wheezing  CV: no obvious cyanosis  MS: moves all visible extremities without noticeable abnormality  PSYCH/NEURO: pleasant and cooperative,  no obvious depression or anxiety, speech and thought processing grossly intact  ASSESSMENT AND PLAN:  Discussed the following assessment and plan:  Fatigue, unspecified type - Plan:  Pt wants to be tested for covid 19  If negative I rec cmet, cbc, tsh, UA/culture, blood cultures and consider CXR and US abdomen call back for now he only wants covid 19 testing  Exposure to Covid-19 Virus - Plan: Novel Coronavirus, NAA ([facility])  Weakness - Plan: see above  Chills (without fever) - Plan: see above       I discussed the assessment and treatment plan with the patient. The patient was provided an opportunity to ask questions and all were answered. The patient agreed with the plan and demonstrated an understanding of the instructions.   The patient was advised to call back or seek an in-person evaluation if the symptoms worsen or if the condition fails to improve as anticipated.  Time spent 15 minutes Delorise Jackson, MD

## 2019-01-31 ENCOUNTER — Other Ambulatory Visit: Payer: Self-pay | Admitting: Internal Medicine

## 2019-02-06 DIAGNOSIS — H353211 Exudative age-related macular degeneration, right eye, with active choroidal neovascularization: Secondary | ICD-10-CM | POA: Diagnosis not present

## 2019-02-06 DIAGNOSIS — H353223 Exudative age-related macular degeneration, left eye, with inactive scar: Secondary | ICD-10-CM | POA: Diagnosis not present

## 2019-02-06 DIAGNOSIS — H43813 Vitreous degeneration, bilateral: Secondary | ICD-10-CM | POA: Diagnosis not present

## 2019-02-06 DIAGNOSIS — H35431 Paving stone degeneration of retina, right eye: Secondary | ICD-10-CM | POA: Diagnosis not present

## 2019-02-11 ENCOUNTER — Other Ambulatory Visit: Payer: Self-pay

## 2019-02-13 ENCOUNTER — Encounter: Payer: Self-pay | Admitting: Internal Medicine

## 2019-02-13 ENCOUNTER — Other Ambulatory Visit: Payer: Self-pay

## 2019-02-13 ENCOUNTER — Ambulatory Visit (INDEPENDENT_AMBULATORY_CARE_PROVIDER_SITE_OTHER): Payer: PPO | Admitting: Internal Medicine

## 2019-02-13 VITALS — Ht 76.0 in | Wt 189.0 lb

## 2019-02-13 DIAGNOSIS — K5902 Outlet dysfunction constipation: Secondary | ICD-10-CM | POA: Diagnosis not present

## 2019-02-13 DIAGNOSIS — R1314 Dysphagia, pharyngoesophageal phase: Secondary | ICD-10-CM

## 2019-02-13 DIAGNOSIS — K225 Diverticulum of esophagus, acquired: Secondary | ICD-10-CM

## 2019-02-13 NOTE — Progress Notes (Signed)
Telephone  Note  This visit type was conducted due to national recommendations for restrictions regarding the COVID-19 pandemic (e.g. social distancing).  This format is felt to be most appropriate for this patient at this time.  All issues noted in this document were discussed and addressed.  No physical exam was performed (except for noted visual exam findings with Video Visits).   I connected with@ on 02/13/19 at  4:00 PM EDT by telephone and verified that I am speaking with the correct person using two identifiers. Location patient: home Location provider: work or home office Persons participating in the virtual visit: patient, provider  I discussed the limitations, risks, security and privacy concerns of performing an evaluation and management service by telephone and the availability of in person appointments. I also discussed with the patient that there may be a patient responsible charge related to this service. The patient expressed understanding and agreed to proceed.  Reason for visit: follow up on recent episode of chills, fatigue and weakness   HPI:  85 yr old male recently evaluated for symptoms of subjective chills, malaise fatigue and weakness.  He states that he was reassured that his symptoms were not likely due to COVID 19 infection, so he did not pursue testing .  His symptoms have resolved, and his chief complaint today is constipation.  He reports difficulty evacuating his rectum and has had to resort to manual disimpaction despite taking a daily stool softener and fiber supplement .  He denies rectal pain and rectal bleeding.  Dysphagia : He underwent an EGD in June for evaluation of dysphagiaand had several gastric polyps biopsied (all benign ) as well as   no esophageal stricture was found but an attempt to dilate the entire esophagus was aborted due to the presence of a Zenkers Diverticulum.   Since then he has noted  improved swallowing .    ROS: Patient denies  headache, fevers, malaise, unintentional weight loss, skin rash, eye pain, sinus congestion and sinus pain, sore throat, dysphagia,  hemoptysis , cough, dyspnea, wheezing, chest pain, palpitations, orthopnea, edema, abdominal pain, nausea, melena, diarrhea,  flank pain, dysuria, hematuria, urinary  Frequency, nocturia, numbness, tingling, seizures,  Focal weakness, Loss of consciousness,  Tremor, insomnia, depression, anxiety, and suicidal ideation.     Past Medical History:  Diagnosis Date  . Allergy   . Basal cell carcinoma    right temple Dr. Evorn Gong 12/2018  . Chicken pox   . Colon polyps   . GERD (gastroesophageal reflux disease)   . history of prostate CA 2006  . Hyperlipidemia   . Leg cramps   . Neuropathy   . Osteoporosis     Past Surgical History:  Procedure Laterality Date  . CATARACT EXTRACTION Right   . CATARACT EXTRACTION W/PHACO Left 12/26/2017   Procedure: CATARACT EXTRACTION PHACO AND INTRAOCULAR LENS PLACEMENT ([place])  COMPLICATED LEFT;  Surgeon: Leandrew Koyanagi, MD;  Location: [place];  Service: Ophthalmology;  Laterality: Left;  [place]  . EYE MUSCLE SURGERY Left   . TONSILLECTOMY AND ADENOIDECTOMY      Family History  Problem Relation Age of Onset  . Stroke Mother   . Arthritis Father   . Heart disease Father   . Brain cancer Brother        1/2 brother (possibly related to mother NOT related to patient)  . Colon cancer Neg Hx   . Esophageal cancer Neg Hx   . Pancreatic cancer Neg Hx   .  Stomach cancer Neg Hx   . Liver disease Neg Hx     SOCIAL HX:  reports that he has never smoked. He has never used smokeless tobacco. He reports that he does not drink alcohol or use drugs.   Current Outpatient Medications:  .  Calcium Carb-Cholecalciferol (CALCIUM 1000 + D PO), Take 1 tablet by mouth daily. , Disp: , Rfl:  .  Cholecalciferol (VITAMIN D3) 400 UNITS CAPS, Take 1 capsule by mouth daily., Disp: , Rfl:  .  Denosumab (PROLIA [place]),  Inject into the skin every 6 (six) months., Disp: , Rfl:  .  Glucosamine-Chondroit-Vit C-Mn (GLUCOSAMINE CHONDR 1500 COMPLX) CAPS, Take 1 capsule by mouth daily. , Disp: , Rfl:  .  loratadine (CLARITIN) 10 MG tablet, Take 10 mg by mouth daily as needed. , Disp: , Rfl:  .  Multiple Vitamins-Minerals (PRESERVISION AREDS PO), Take 2 capsules by mouth daily., Disp: , Rfl:  .  naproxen (NAPROSYN) 250 MG tablet, Take 250 mg by mouth as needed., Disp: , Rfl:  .  neomycin-polymyxin b-dexamethasone (MAXITROL) 3.5-10000-0.1 OINT, , Disp: , Rfl:  .  omeprazole (PRILOSEC) 40 MG capsule, TAKE 1 CAPSULE (40 MG TOTAL) BY MOUTH DAILY. IN THE MORNING 30 MINUTES PRIOR TO EATING, Disp: 90 capsule, Rfl: 3 .  Ranibizumab (LUCENTIS IO), Inject into the eye., Disp: , Rfl:  .  vitamin B-12 (CYANOCOBALAMIN) 1000 MCG tablet, Take 1,000 mcg by mouth daily., Disp: , Rfl:   EXAM:   General impression: alert, cooperative and articulate.  No signs of being in distress  Lungs: speech is fluent sentence length suggests that patient is not short of breath and not punctuated by cough, sneezing or sniffing. [name]   Psych: affect normal.  speech is articulate and non pressured .  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 . Trial of magnesium supplementation to improve motility,  Followed by [facility] if needed      I discussed the assessment and treatment plan with the patient. The patient was provided an opportunity to ask questions and all were answered. The patient agreed with the plan and demonstrated an understanding of the instructions.   The patient was advised to call back or seek an  in-person evaluation if the symptoms worsen or if the condition fails to improve as anticipated.  I provided  25 minutes of non-face-to-face time during this encounter in counselling patient on the above mentioned problems and reviewing recent records with patient    Crecencio Mc, MD

## 2019-02-15 ENCOUNTER — Other Ambulatory Visit: Payer: Self-pay

## 2019-02-15 DIAGNOSIS — Z20822 Contact with and (suspected) exposure to covid-19: Secondary | ICD-10-CM

## 2019-02-15 DIAGNOSIS — R6889 Other general symptoms and signs: Secondary | ICD-10-CM | POA: Diagnosis not present

## 2019-02-16 DIAGNOSIS — K5902 Outlet dysfunction constipation: Secondary | ICD-10-CM | POA: Insufficient documentation

## 2019-02-16 DIAGNOSIS — K225 Diverticulum of esophagus, acquired: Secondary | ICD-10-CM | POA: Insufficient documentation

## 2019-02-16 HISTORY — DX: Diverticulum of esophagus, acquired: K22.5

## 2019-02-16 LAB — NOVEL CORONAVIRUS, NAA: SARS-CoV-2, NAA: NOT DETECTED

## 2019-02-16 NOTE — Assessment & Plan Note (Signed)
Improved with dilation of esophagus during June 2020 EGD

## 2019-02-16 NOTE — Assessment & Plan Note (Signed)
Noted on June 2020 EGD

## 2019-02-16 NOTE — Assessment & Plan Note (Signed)
No change with  increase in fiber intake to 25 to 35 grams daily using  Miralax and stool softener. . Trial of magnesium supplementation to improve motility,  Followed by [facility] if needed

## 2019-03-20 ENCOUNTER — Telehealth: Payer: Self-pay | Admitting: Internal Medicine

## 2019-03-20 NOTE — Telephone Encounter (Signed)
Scheduled 04/11/19 for Prolia at appointment with PCP.

## 2019-03-20 NOTE — Telephone Encounter (Signed)
Pt would like to have his prolia injection on 10/16 when his wife comes into the office for a cpe, if he can not have it then he has appt already on 10/21. Please call patient.

## 2019-03-27 DIAGNOSIS — Z1159 Encounter for screening for other viral diseases: Secondary | ICD-10-CM | POA: Diagnosis not present

## 2019-04-10 ENCOUNTER — Ambulatory Visit: Payer: PPO | Admitting: Internal Medicine

## 2019-04-10 ENCOUNTER — Other Ambulatory Visit: Payer: Self-pay

## 2019-04-10 ENCOUNTER — Ambulatory Visit (INDEPENDENT_AMBULATORY_CARE_PROVIDER_SITE_OTHER): Payer: PPO

## 2019-04-10 DIAGNOSIS — Z0000 Encounter for general adult medical examination without abnormal findings: Secondary | ICD-10-CM

## 2019-04-10 NOTE — Progress Notes (Addendum)
Subjective:   George Owens is a 85 y.o. male who presents for Medicare Annual/Subsequent preventive examination.  Review of Systems:  No ROS.  Medicare Wellness Virtual Visit.  Visual/audio telehealth visit, UTA vital signs.   See social history for additional risk factors.   Cardiac Risk Factors include: advanced age (>20men, >2 women);male gender     Objective:    Vitals: There were no vitals taken for this visit.  There is no height or weight on file to calculate BMI.  Advanced Directives 04/10/2019 04/04/2018 12/26/2017 04/03/2017 03/23/2016  Does Patient Have a Medical Advance Directive? Yes Yes Yes Yes Yes  Type of Paramedic of [place];Living will Healthcare Power of [place]  Does patient want to make changes to medical advance directive? No - Patient declined No - Patient declined No - Patient declined No - Patient declined -  Copy of [place] in Chart? No - copy requested No - copy requested Yes No - copy requested No - copy requested    Tobacco Social History   Tobacco Use  Smoking Status Never Smoker  Smokeless Tobacco Never Used     Counseling given: Not Answered   Clinical Intake:                       Past Medical History:  Diagnosis Date  . Allergy   . Basal cell carcinoma    right temple Dr. Evorn Gong 12/2018  . Chicken pox   . Colon polyps   . GERD (gastroesophageal reflux disease)   . history of prostate CA 2006  . Hyperlipidemia   . Leg cramps   . Neuropathy   . Osteoporosis    Past Surgical History:  Procedure Laterality Date  . CATARACT EXTRACTION Right   . CATARACT EXTRACTION W/PHACO Left 12/26/2017   Procedure: CATARACT EXTRACTION PHACO AND INTRAOCULAR LENS PLACEMENT ([place])  COMPLICATED LEFT;  Surgeon: Leandrew Koyanagi, MD;  Location: [place];  Service: Ophthalmology;  Laterality:  Left;  [place]  . EYE MUSCLE SURGERY Left   . TONSILLECTOMY AND ADENOIDECTOMY     Family History  Problem Relation Age of Onset  . Stroke Mother   . Arthritis Father   . Heart disease Father   . Brain cancer Brother        1/2 brother (possibly related to mother NOT related to patient)  . Colon cancer Neg Hx   . Esophageal cancer Neg Hx   . Pancreatic cancer Neg Hx   . Stomach cancer Neg Hx   . Liver disease Neg Hx    Social History   Socioeconomic History  . Marital status: Married    Spouse name: Not on file  . Number of children: Not on file  . Years of education: Not on file  . Highest education level: Not on file  Occupational History  . Not on file  Social Needs  . Financial resource strain: Not hard at all  . Food insecurity    Worry: Never true    Inability: Never true  . Transportation needs    Medical: No    Non-medical: No  Tobacco Use  . Smoking status: Never Smoker  . Smokeless tobacco: Never Used  Substance and Sexual Activity  . Alcohol use: No  . Drug use: No  . Sexual activity: Yes    Partners: Female  Lifestyle  .  Physical activity    Days per week: 5 days    Minutes per session: 30 min  . Stress: Not at all  Relationships  . Social Herbalist on phone: Not on file    Gets together: Not on file    Attends religious service: Not on file    Active member of club or organization: Not on file    Attends meetings of clubs or organizations: Not on file    Relationship status: Not on file  Other Topics Concern  . Not on file  Social History Narrative   Lives with wife in a 5 story retirement home.  He lives on the first floor.  4 year college degree.  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      Outpatient Encounter Medications as of 04/10/2019  Medication Sig  . Calcium Carb-Cholecalciferol (CALCIUM 1000 + D PO) Take 1 tablet by mouth daily.   . Cholecalciferol (VITAMIN D3) 400 UNITS CAPS  Take 1 capsule by mouth daily.  . Denosumab (PROLIA [place]) Inject into the skin every 6 (six) months.  . loratadine (CLARITIN) 10 MG tablet Take 10 mg by mouth daily as needed.   . Multiple Vitamins-Minerals (PRESERVISION AREDS PO) Take 2 capsules by mouth daily.  . naproxen (NAPROSYN) 250 MG tablet Take 250 mg by mouth as needed.  . neomycin-polymyxin b-dexamethasone (MAXITROL) 3.5-10000-0.1 OINT   . omeprazole (PRILOSEC) 40 MG capsule TAKE 1 CAPSULE (40 MG TOTAL) BY MOUTH DAILY. IN THE MORNING 30 MINUTES PRIOR TO EATING  . Ranibizumab (LUCENTIS IO) Inject into the eye.  . vitamin B-12 (CYANOCOBALAMIN) 1000 MCG tablet Take 1,000 mcg by mouth daily.  . Glucosamine-Chondroit-Vit C-Mn (GLUCOSAMINE CHONDR 1500 COMPLX) CAPS Take 1 capsule by mouth daily.    No facility-administered encounter medications on file as of 04/10/2019.     Activities of Daily Living In your present state of health, do you have any difficulty performing the following activities: 04/10/2019  Hearing? N  Vision? N  Difficulty concentrating or making decisions? N  Walking or climbing stairs? N  Dressing or bathing? N  Doing errands, shopping? Y  Comment He does not Physiological scientist and eating ? N  Using the Toilet? N  In the past six months, have you accidently leaked urine? N  Do you have problems with loss of bowel control? N  Managing your Medications? N  Managing your Finances? N  Housekeeping or managing your Housekeeping? N  Some recent data might be hidden    Patient Care Team: Crecencio Mc, MD as PCP - General (Internal Medicine)   Assessment:   This is a routine wellness examination for George Owens.  I connected with patient 04/10/19 at 10:30 AM EDT by an audio enabled telemedicine application and verified that I am speaking with the correct person using two identifiers. Patient stated full name and DOB. Patient gave permission to continue with virtual visit. Patient's location was at home and Nurse's  location was at [place] office.   Health Maintenance Due: Update all pending maintenance due as appropriate.   See completed HM at the end of note.   Eye: Visual acuity not assessed. Virtual visit. Wears corrective lenses. Followed by their ophthalmologist every 12 weeks.    Dental: Visits every 6 months.    Hearing: Demonstrates normal hearing during visit.  Safety:  Patient feels safe at home- yes Patient does have smoke detectors at home- yes Patient does wear  sunscreen or protective clothing when in direct sunlight - yes Patient does wear seat belt when in a moving vehicle - yes Patient drives- no Adequate lighting in walkways free from debris- yes Grab bars and handrails used as appropriate- yes Ambulates with no assistive device  Social: Alcohol intake - no     Smoking history- never   Smokers in home? none Illicit drug use? none  Depression: PHQ 2 &9 complete. See screening below. Denies irritability, anhedonia, sadness/tearfullness.    Falls: See screening below.    Medication: Taking as directed and without issues.   Covid-19: Precautions and sickness symptoms discussed. Wears mask, social distancing, hand hygiene as appropriate.   Activities of Daily Living Patient denies needing assistance with: household chores, feeding themselves, getting from bed to chair, getting to the toilet, bathing/showering, dressing, managing money, or preparing meals.   Memory: Patient is alert. Patient denies difficulty focusing or concentrating. Correctly identified the president of the [place], season and recall 2/3. Patient likes to listen to audio books, plays piano, online church services and plays cards for brain stimulation.  BMI- discussed the importance of a healthy diet, water intake and the benefits of aerobic exercise.  Educational material provided.  Physical activity- walking 2 miles daily, swimming twice weekly.  Diet: healthy  Water: good intake  Other  Providers Patient Care Team: Crecencio Mc, MD as PCP - General (Internal Medicine)  Exercise Activities and Dietary recommendations Current Exercise Habits: Home exercise routine, Type of exercise: walking(swimming), Time (Minutes): 30, Frequency (Times/Week): 5, Weekly Exercise (Minutes/Week): 150, Intensity: Mild  Goals    . Follow up with Primary Care Provider       Fall Risk Fall Risk  04/10/2019 04/04/2018 04/03/2017 11/01/2016 03/23/2016  Falls in the past year? 0 No No No No  Number falls in past yr: 0 - - - -   Timed Get Up and Go Performed: no, virtual visit  Depression Screen PHQ 2/9 Scores 04/10/2019 04/04/2018 04/03/2017 11/01/2016  PHQ - 2 Score 0 0 0 0  PHQ- 9 Score - - 0 -    Cognitive Function MMSE - Mini Mental State Exam 03/23/2016  Orientation to time 5  Orientation to Place 5  Registration 3  Attention/ Calculation 5  Recall 3  Language- name 2 objects 2  Language- repeat 1  Language- follow 3 step command 3  Language- read & follow direction 1  Write a sentence 1  Copy design 1  Total score 30     6CIT Screen 04/10/2019 04/04/2018 04/03/2017  What Year? 0 points 0 points 0 points  What month? 0 points 0 points 0 points  What time? 0 points 0 points 0 points  Count back from 20 0 points 0 points 0 points  Months in reverse 0 points 0 points 0 points  Repeat phrase - 0 points 0 points  Total Score - 0 0    Immunization History  Administered Date(s) Administered  . Influenza Split 04/18/2013  . Influenza, High Dose Seasonal PF 03/30/2016  . Influenza-Unspecified 03/06/2017, 03/21/2018  . Pneumococcal Conjugate-13 10/04/2013  . Pneumococcal Polysaccharide-23 02/28/2012  . Tdap 04/30/2013  . Zoster 11/24/2010  . Zoster Recombinat (Shingrix) 05/25/2018, 02/25/2019   Screening Tests Health Maintenance  Topic Date Due  . INFLUENZA VACCINE  09/18/2019 (Originally 01/19/2019)  . TETANUS/TDAP  05/01/2023  . PNA vac Low Risk Adult  Completed       Plan:   Keep all routine maintenance appointments.   Follow up  with your doctor on 04/11/19 @ 12:00  Medicare Attestation I have personally reviewed: The patient's medical and social history Their use of alcohol, tobacco or illicit drugs Their current medications and supplements The patient's functional ability including ADLs,fall risks, home safety risks, cognitive, and hearing and visual impairment Diet and physical activities Evidence for depression   In addition, I have reviewed and discussed with patient certain preventive protocols, quality metrics, and best practice recommendations. A written personalized care plan for preventive services as well as general preventive health recommendations were provided to patient via mail.     OBrien-Blaney, Margorie Renner L, LPN  [phone]   I have reviewed the above information and agree with above.   Deborra Medina, MD

## 2019-04-10 NOTE — Patient Instructions (Addendum)
  George Owens , Thank you for taking time to come for your Medicare Wellness Visit. I appreciate your ongoing commitment to your health goals. Please review the following plan we discussed and let me know if I can assist you in the future.   These are the goals we discussed: Goals    . Follow up with Primary Care Provider       This is a list of the screening recommended for you and due dates:  Health Maintenance  Topic Date Due  . Flu Shot  09/18/2019*  . Tetanus Vaccine  05/01/2023  . Pneumonia vaccines  Completed  *Topic was postponed. The date shown is not the original due date.

## 2019-04-11 ENCOUNTER — Ambulatory Visit (INDEPENDENT_AMBULATORY_CARE_PROVIDER_SITE_OTHER): Payer: PPO | Admitting: Internal Medicine

## 2019-04-11 ENCOUNTER — Encounter: Payer: Self-pay | Admitting: Internal Medicine

## 2019-04-11 ENCOUNTER — Other Ambulatory Visit: Payer: Self-pay

## 2019-04-11 DIAGNOSIS — R1314 Dysphagia, pharyngoesophageal phase: Secondary | ICD-10-CM

## 2019-04-11 NOTE — Progress Notes (Signed)
Subjective:  Patient ID: [place], male    DOB: 14-Mar-1932  Age: 85 y.o. MRN: [id number]  CC: The encounter diagnosis was Dysphagia, pharyngoesophageal phase.  HPI [place] presents for follow up on chronic constipation  That did not improve wit hdaily stool softener and BFL.  After last visit August 26 . Tried using magnesium oxide  Which made stools loose .   stopped it due to nausea /upset stomach, weak,  Dragged out,  With mild chills.   Back to trying nightly stool softener and BFL.  Still feels he has loss of  anal sphincter/muscle tone to push out the stool   Stools are at times thin and stringy .   Some hemorrhoid pain aggravated by hard stool and frequent stools.  Using an OTC cream.   Marital issues:  Married the sister of his first wife for the past 9.5 years after his wife died of breast cancer.  Bothered by lack of intimacy/sex, wife's tendency to avoid political discussions.    Was referred to [facility] April for dysphagia   EGD done,  Colonoscopy deferred due to age but symptoms were not significant at that time   Once a week feels excessive fatigue at the end of the day .  No chest pain, no shortness of breath ,  No headache.  No dark urine or joint pain .   Walks 2 miles without a problem  Outpatient Medications Prior to Visit  Medication Sig Dispense Refill  . Calcium Carb-Cholecalciferol (CALCIUM 1000 + D PO) Take 1 tablet by mouth daily.     . Cholecalciferol (VITAMIN D3) 400 UNITS CAPS Take 1 capsule by mouth daily.    . Denosumab (PROLIA [place]) Inject into the skin every 6 (six) months.    . loratadine (CLARITIN) 10 MG tablet Take 10 mg by mouth daily as needed.     . Multiple Vitamins-Minerals (PRESERVISION AREDS PO) Take 2 capsules by mouth daily.    . naproxen (NAPROSYN) 250 MG tablet Take 250 mg by mouth as needed.    [name] omeprazole (PRILOSEC) 40 MG capsule TAKE 1 CAPSULE (40 MG TOTAL) BY MOUTH DAILY. IN THE MORNING 30 MINUTES PRIOR TO EATING 90  capsule 3  . Ranibizumab (LUCENTIS IO) Inject into the eye.    . vitamin B-12 (CYANOCOBALAMIN) 1000 MCG tablet Take 1,000 mcg by mouth daily.    [name] neomycin-polymyxin b-dexamethasone (MAXITROL) 3.5-10000-0.1 OINT     . Glucosamine-Chondroit-Vit C-Mn (GLUCOSAMINE CHONDR 1500 COMPLX) CAPS Take 1 capsule by mouth daily.      No facility-administered medications prior to visit.     Review of Systems;  Patient denies headache, fevers, malaise, unintentional weight loss, skin rash, eye pain, sinus congestion and sinus pain, sore throat, dysphagia,  hemoptysis , cough, dyspnea, wheezing, chest pain, palpitations, orthopnea, edema, abdominal pain, nausea, melena, diarrhea, constipation, flank pain, dysuria, hematuria, urinary  Frequency, nocturia, numbness, tingling, seizures,  Focal weakness, Loss of consciousness,  Tremor, insomnia, depression, anxiety, and suicidal ideation.      Objective:  BP 110/80 (BP Location: Left Arm, Patient Position: Sitting, Cuff Size: Normal)   Pulse 80   Temp (!) 97.1 F (36.2 C) (Temporal)   Resp 15   Ht 6\' 4"  (1.93 m)   Wt 185 lb 12.8 oz (84.3 kg)   SpO2 99%   BMI 22.62 kg/m   BP Readings from Last 3 Encounters:  04/11/19 110/80  11/29/18 (!) 109/58  08/01/18 130/80    Wt  Readings from Last 3 Encounters:  04/11/19 185 lb 12.8 oz (84.3 kg)  02/13/19 189 lb (85.7 kg)  11/29/18 189 lb (85.7 kg)    General appearance: alert, cooperative and appears stated age Ears: normal TM's and external ear canals both ears Throat: lips, mucosa, and tongue normal; teeth and gums normal Neck: no adenopathy, no carotid bruit, supple, symmetrical, trachea midline and thyroid not enlarged, symmetric, no tenderness/mass/nodules Back: symmetric, no curvature. ROM normal. No CVA tenderness. Lungs: clear to auscultation bilaterally Heart: regular rate and rhythm, S1, S2 normal, no murmur, click, rub or gallop Abdomen: soft, non-tender; bowel sounds normal; no masses,  no  organomegaly Pulses: 2+ and symmetric Skin: Skin color, texture, turgor normal. No rashes or lesions Lymph nodes: Cervical, supraclavicular, and axillary nodes normal.  No results found for: HGBA1C  Lab Results  Component Value Date   CREATININE 0.90 08/01/2018   CREATININE 0.93 04/04/2018   CREATININE 0.89 12/25/2017    Lab Results  Component Value Date   WBC 5.3 08/01/2018   HGB 14.6 08/01/2018   HCT 42.7 08/01/2018   PLT 166.0 08/01/2018   GLUCOSE 102 (H) 08/01/2018   CHOL 207 (H) 04/04/2018   TRIG 142.0 04/04/2018   HDL 41.30 04/04/2018   LDLDIRECT 129.0 03/31/2017   LDLCALC 137 (H) 04/04/2018   ALT 22 08/01/2018   AST 24 08/01/2018   NA 138 08/01/2018   K 4.1 08/01/2018   CL 99 08/01/2018   CREATININE 0.90 08/01/2018   BUN 20 08/01/2018   CO2 32 08/01/2018   TSH 3.40 04/04/2018   PSA 0.54 04/04/2018     Assessment & Plan:   Problem List Items Addressed This Visit      Unprioritized   Dysphagia, pharyngoesophageal phase    Persistent despite multiple  trials of laxatives . He is requesting GI evaluation by Dr Hilarie Fredrickson.          I have discontinued Greer A. Rembold's Glucosamine Chondr 1500 Complx. I am also having him maintain his loratadine, Calcium Carb-Cholecalciferol (CALCIUM 1000 + D PO), Vitamin D3, Multiple Vitamins-Minerals (PRESERVISION AREDS PO), naproxen, Denosumab (PROLIA [place]), Ranibizumab (LUCENTIS IO), vitamin B-12, neomycin-polymyxin b-dexamethasone, and omeprazole.  No orders of the defined types were placed in this encounter.   Medications Discontinued During This Encounter  Medication Reason  . Glucosamine-Chondroit-Vit C-Mn (GLUCOSAMINE CHONDR 1500 COMPLX) CAPS Patient Preference    Follow-up: No follow-ups on file.   Crecencio Mc, MD

## 2019-04-12 ENCOUNTER — Telehealth: Payer: Self-pay | Admitting: *Deleted

## 2019-04-12 NOTE — Telephone Encounter (Signed)
Copied from [place] [id number]. Topic: General - Other >> Apr 12, 2019  8:13 AM Celene Kras A wrote: Reason for CRM: Pt called stating he is needing a call from [place] regarding his appt yesterday. Please advise.

## 2019-04-13 NOTE — Assessment & Plan Note (Signed)
Persistent despite multiple  trials of laxatives . He is requesting GI evaluation by Dr Hilarie Fredrickson.

## 2019-04-15 ENCOUNTER — Other Ambulatory Visit: Payer: Self-pay | Admitting: Internal Medicine

## 2019-04-15 DIAGNOSIS — M810 Age-related osteoporosis without current pathological fracture: Secondary | ICD-10-CM

## 2019-04-15 DIAGNOSIS — Z8546 Personal history of malignant neoplasm of prostate: Secondary | ICD-10-CM

## 2019-04-15 DIAGNOSIS — E7800 Pure hypercholesterolemia, unspecified: Secondary | ICD-10-CM

## 2019-04-15 DIAGNOSIS — E559 Vitamin D deficiency, unspecified: Secondary | ICD-10-CM

## 2019-04-15 NOTE — Progress Notes (Unsigned)
MED

## 2019-04-15 NOTE — Telephone Encounter (Signed)
FASTING LABS ORDERED.  

## 2019-04-15 NOTE — Telephone Encounter (Signed)
Spoke with pt and he stated that he thought it was discussed in his visit last Thursday that he was supposed to have some lab work done. Pt stated that he wasn't sure when it was supposed to be done. I don't see any lab work ordered in the chart.

## 2019-04-16 NOTE — Telephone Encounter (Signed)
Pt is scheduled for fasting labs on 04/19/2019 at 3:45pm. Pt is aware of appt date and time.

## 2019-04-18 ENCOUNTER — Other Ambulatory Visit: Payer: Self-pay

## 2019-04-19 ENCOUNTER — Other Ambulatory Visit: Payer: Self-pay

## 2019-04-19 ENCOUNTER — Ambulatory Visit (INDEPENDENT_AMBULATORY_CARE_PROVIDER_SITE_OTHER): Payer: PPO | Admitting: *Deleted

## 2019-04-19 ENCOUNTER — Other Ambulatory Visit (INDEPENDENT_AMBULATORY_CARE_PROVIDER_SITE_OTHER): Payer: PPO

## 2019-04-19 DIAGNOSIS — E559 Vitamin D deficiency, unspecified: Secondary | ICD-10-CM | POA: Diagnosis not present

## 2019-04-19 DIAGNOSIS — M810 Age-related osteoporosis without current pathological fracture: Secondary | ICD-10-CM

## 2019-04-19 DIAGNOSIS — E7800 Pure hypercholesterolemia, unspecified: Secondary | ICD-10-CM

## 2019-04-19 DIAGNOSIS — Z8546 Personal history of malignant neoplasm of prostate: Secondary | ICD-10-CM | POA: Diagnosis not present

## 2019-04-19 MED ORDER — DENOSUMAB 60 MG/ML ~~LOC~~ SOSY
60.0000 mg | PREFILLED_SYRINGE | Freq: Once | SUBCUTANEOUS | Status: AC
  Administered 2019-04-19: 60 mg via SUBCUTANEOUS

## 2019-04-19 NOTE — Progress Notes (Signed)
Patient presented for Prolia injection to Right arm Low[place], patient voiced no concerns or complaints during or after injection. 

## 2019-04-20 LAB — COMPREHENSIVE METABOLIC PANEL
AG Ratio: 2 (calc) (ref 1.0–2.5)
ALT: 21 U/L (ref 9–46)
AST: 23 U/L (ref 10–35)
Albumin: 4.3 g/dL (ref 3.6–5.1)
Alkaline phosphatase (APISO): 41 U/L (ref 35–144)
BUN: 21 mg/dL (ref 7–25)
CO2: 28 mmol/L (ref 20–32)
Calcium: 9.5 mg/dL (ref 8.6–10.3)
Chloride: 100 mmol/L (ref 98–110)
Creat: 0.83 mg/dL (ref 0.70–1.11)
Globulin: 2.2 g/dL (calc) (ref 1.9–3.7)
Glucose, Bld: 95 mg/dL (ref 65–99)
Potassium: 4.2 mmol/L (ref 3.5–5.3)
Sodium: 140 mmol/L (ref 135–146)
Total Bilirubin: 0.4 mg/dL (ref 0.2–1.2)
Total Protein: 6.5 g/dL (ref 6.1–8.1)

## 2019-04-20 LAB — CBC WITH DIFFERENTIAL/PLATELET
Absolute Monocytes: 507 cells/uL (ref 200–950)
Basophils Absolute: 18 cells/uL (ref 0–200)
Basophils Relative: 0.3 %
Eosinophils Absolute: 100 cells/uL (ref 15–500)
Eosinophils Relative: 1.7 %
HCT: 41.5 % (ref 38.5–50.0)
Hemoglobin: 13.9 g/dL (ref 13.2–17.1)
Lymphs Abs: 1752 cells/uL (ref 850–3900)
MCH: 30.3 pg (ref 27.0–33.0)
MCHC: 33.5 g/dL (ref 32.0–36.0)
MCV: 90.4 fL (ref 80.0–100.0)
MPV: 11.3 fL (ref 7.5–12.5)
Monocytes Relative: 8.6 %
Neutro Abs: 3522 cells/uL (ref 1500–7800)
Neutrophils Relative %: 59.7 %
Platelets: 172 10*3/uL (ref 140–400)
RBC: 4.59 10*6/uL (ref 4.20–5.80)
RDW: 13 % (ref 11.0–15.0)
Total Lymphocyte: 29.7 %
WBC: 5.9 10*3/uL (ref 3.8–10.8)

## 2019-04-20 LAB — LIPID PANEL
Cholesterol: 198 mg/dL (ref <200)
HDL: 41 mg/dL (ref >=40)
LDL Cholesterol (Calc): 128 mg/dL (calc) — ABNORMAL HIGH
Non-HDL Cholesterol (Calc): 157 mg/dL (calc) — ABNORMAL HIGH (ref <130)
Total CHOL/HDL Ratio: 4.8 (calc) (ref <5.0)
Triglycerides: 171 mg/dL — ABNORMAL HIGH (ref <150)

## 2019-04-20 LAB — PSA: PSA: 0.6 ng/mL (ref <=4.0)

## 2019-04-20 LAB — TSH: TSH: 3.92 mIU/L (ref 0.40–4.50)

## 2019-04-20 LAB — VITAMIN D 25 HYDROXY (VIT D DEFICIENCY, FRACTURES): Vit D, 25-Hydroxy: 63 ng/mL (ref 30–100)

## 2019-04-24 DIAGNOSIS — H353211 Exudative age-related macular degeneration, right eye, with active choroidal neovascularization: Secondary | ICD-10-CM | POA: Diagnosis not present

## 2019-04-24 DIAGNOSIS — H43813 Vitreous degeneration, bilateral: Secondary | ICD-10-CM | POA: Diagnosis not present

## 2019-04-24 DIAGNOSIS — H353223 Exudative age-related macular degeneration, left eye, with inactive scar: Secondary | ICD-10-CM | POA: Diagnosis not present

## 2019-04-24 DIAGNOSIS — H35431 Paving stone degeneration of retina, right eye: Secondary | ICD-10-CM | POA: Diagnosis not present

## 2019-04-29 ENCOUNTER — Telehealth: Payer: Self-pay | Admitting: Internal Medicine

## 2019-04-29 NOTE — Telephone Encounter (Signed)
Pt states he is having issues with bloating, feels like he needs to have a BM but only has stringy, messy ones. When he has to urinate he has to have a small bm. Pt requests OV. Pt scheduled to see Dr. Hilarie Fredrickson 05/21/19@10 :10am. Pt aware of appt and pt placed on cancellation list.

## 2019-04-29 NOTE — Telephone Encounter (Signed)
Pt requested a call back to discuss his current symptoms.

## 2019-05-02 ENCOUNTER — Ambulatory Visit (INDEPENDENT_AMBULATORY_CARE_PROVIDER_SITE_OTHER): Payer: PPO | Admitting: Nurse Practitioner

## 2019-05-02 ENCOUNTER — Encounter: Payer: Self-pay | Admitting: Nurse Practitioner

## 2019-05-02 VITALS — BP 116/72 | HR 80 | Temp 97.9°F | Ht 76.0 in | Wt 185.0 lb

## 2019-05-02 DIAGNOSIS — K5900 Constipation, unspecified: Secondary | ICD-10-CM

## 2019-05-02 MED ORDER — LUBIPROSTONE 8 MCG PO CAPS: 8.0000 ug | ORAL_CAPSULE | Freq: Two times a day (BID) | ORAL | 0 refills | Status: DC

## 2019-05-02 NOTE — Progress Notes (Signed)
Chief Complaint:   constipation   IMPRESSION and PLAN:    85 yo male with constipation, worse over last few months. Difficult time expelling stool which is usually either "stringly" or in small pieces. - Fiber supplement doesn't seem to be helping so will stop it for now.  -Trial of Amitiza 8 mcg, hopefully insurance will pay for it.  -Patient will call us with update in 2-3 weeks.  -If no improvement consider colonoscopy.  I would like to avoid invasive work-up given advanced age but patient seems to be in pretty good health and he actually inquired about a colonoscopy -Consider Pelvic floor PT if no improvement.    HPI:     Patient is an 85 yo male from [place], [place] with pmh significant for prostate cancer, osteoporosis, GERD, Zenker's diverticulum. He is followed by Dr. Hilarie Fredrickson.  [name] is here for evaluation of constipation.  Problem started 2 years ago but it wasn'tt too bothersome until a few months ago.  He gets urge to defecate but has difficulty expelling the stool.  When stool does pass it is either stringy or in pieces the size of his thumb.  PCP recommended magnesium tablets but they caused nausea so he stopped them.  He then started fiber and stool softeners.  Stopped the stool softeners because his stools were already soft with the fiber and he saw no advantage to the stool softener.  He is currently taking 2-3 fiber pills a day. He has no rectal or abdominal pain. Labs two weeks ago were normal. No blood in stool.    Data Reviewed:  04/19/2019 Normal renal function, normal liver tests, normal CBC  Review of systems:     No chest pain, no SOB, no fevers, no urinary sx   Past Medical History:  Diagnosis Date  . Allergy   . Basal cell carcinoma    right temple Dr. Evorn Gong 12/2018  . Chicken pox   . Colon polyps   . GERD (gastroesophageal reflux disease)   . history of prostate CA 2006  . Hyperlipidemia   . Leg cramps   . Neuropathy   . Osteoporosis      Patient's surgical history, family medical history, social history, medications and allergies were all reviewed in Epic   Serum creatinine: 0.83 mg/dL 04/19/19 1544 Estimated creatinine clearance: 78.6 mL/min  Current Outpatient Medications  Medication Sig Dispense Refill  . Calcium Carb-Cholecalciferol (CALCIUM 1000 + D PO) Take 1 tablet by mouth daily.     . Cholecalciferol (VITAMIN D3) 400 UNITS CAPS Take 2 capsules by mouth daily.     . Denosumab (PROLIA [place]) Inject into the skin every 6 (six) months.    [name] glucosamine-chondroitin 500-400 MG tablet Take 1 tablet by mouth daily.    [name] loratadine (CLARITIN) 10 MG tablet Take 10 mg by mouth daily as needed.     . Methylcellulose, Laxative, 500 MG TABS Take by mouth as directed. Takes 2 daily    . Multiple Vitamins-Minerals (PRESERVISION AREDS PO) Take 2 capsules by mouth daily.    . naproxen (NAPROSYN) 250 MG tablet Take 250 mg by mouth as needed.    . neomycin-polymyxin b-dexamethasone (MAXITROL) 3.5-10000-0.1 OINT Every three months after eye injection    . omeprazole (PRILOSEC) 40 MG capsule TAKE 1 CAPSULE (40 MG TOTAL) BY MOUTH DAILY. IN THE MORNING 30 MINUTES PRIOR TO EATING 90 capsule 3  . Ranibizumab (LUCENTIS IO) Inject into the eye as needed. As  needed every three months.    . vitamin B-12 (CYANOCOBALAMIN) 1000 MCG tablet Take 1,000 mcg by mouth daily.    [name] lubiprostone (AMITIZA) 8 MCG capsule Take 1 capsule (8 mcg total) by mouth 2 (two) times daily with a meal. 60 capsule 0   No current facility-administered medications for this visit.     Physical Exam:     BP 116/72   Pulse 80   Temp 97.9 F (36.6 C)   Ht 6\' 4"  (1.93 m)   Wt 185 lb (83.9 kg)   BMI 22.52 kg/m   GENERAL:  Pleasant male in NAD PSYCH: : Cooperative, normal affect EENT:  conjunctiva pink, mucous membranes moist, neck supple without masses CARDIAC:  RRR,  no peripheral edema PULM: Normal respiratory effort, lungs CTA bilaterally, no wheezing  ABDOMEN:  Nondistended, soft, nontender. No obvious masses, no hepatomegaly,  normal bowel sounds SKIN:  turgor, no lesions seen Musculoskeletal:  Normal muscle tone, normal strength NEURO: Alert and oriented x 3, no focal neurologic deficits   Tye Savoy , NP 05/02/2019, 10:49 AM

## 2019-05-02 NOTE — Patient Instructions (Signed)
If you are age 83 or older, your body mass index should be between 23-30. Your Body mass index is 22.52 kg/m. If this is out of the aforementioned range listed, please consider follow up with your Primary Care Provider.  If you are age 85 or younger, your body mass index should be between 19-25. Your Body mass index is 22.52 kg/m. If this is out of the aformentioned range listed, please consider follow up with your Primary Care Provider.   We have sent the following medications to your pharmacy for you to pick up at your convenience: Amitiza 8 mcg  Use Glycerin suppositories as needed.  (this is over-the-counter)  STOP fiber.  Call in two weeks with an update.  Thank you for choosing me and [facility].   Tye Savoy, NP

## 2019-05-03 ENCOUNTER — Encounter: Payer: Self-pay | Admitting: Nurse Practitioner

## 2019-05-08 NOTE — Progress Notes (Signed)
Addendum: Reviewed and agree with assessment and management plan. Kortney Potvin M, MD  

## 2019-05-13 ENCOUNTER — Telehealth: Payer: Self-pay | Admitting: Nurse Practitioner

## 2019-05-13 NOTE — Telephone Encounter (Signed)
Spoke with the patient. He states he is satisfied with the Amitiza at this time. He would like to "finish up this supply" before he considers anything else. However if you know of anything less expensive, he would be interested in that.

## 2019-05-21 ENCOUNTER — Ambulatory Visit: Payer: PPO | Admitting: Internal Medicine

## 2019-05-27 DIAGNOSIS — D485 Neoplasm of uncertain behavior of skin: Secondary | ICD-10-CM | POA: Diagnosis not present

## 2019-05-27 DIAGNOSIS — Z85828 Personal history of other malignant neoplasm of skin: Secondary | ICD-10-CM | POA: Diagnosis not present

## 2019-05-27 DIAGNOSIS — Z08 Encounter for follow-up examination after completed treatment for malignant neoplasm: Secondary | ICD-10-CM | POA: Diagnosis not present

## 2019-05-27 DIAGNOSIS — L821 Other seborrheic keratosis: Secondary | ICD-10-CM | POA: Diagnosis not present

## 2019-05-27 DIAGNOSIS — L820 Inflamed seborrheic keratosis: Secondary | ICD-10-CM | POA: Diagnosis not present

## 2019-05-27 DIAGNOSIS — C44519 Basal cell carcinoma of skin of other part of trunk: Secondary | ICD-10-CM | POA: Diagnosis not present

## 2019-05-27 NOTE — Telephone Encounter (Signed)
Pt called back and stated that he has a week's supply of Amitiza--it is working fine but he would like to consider a more cost-effective drug.

## 2019-05-27 NOTE — Telephone Encounter (Signed)
Amitiza, Movantik, and Linzess are both tier 3 on his plan. Tier 2 are things like Lactulose.

## 2019-05-28 ENCOUNTER — Other Ambulatory Visit: Payer: Self-pay

## 2019-05-28 MED ORDER — LUBIPROSTONE 8 MCG PO CAPS: 8.0000 ug | ORAL_CAPSULE | Freq: Two times a day (BID) | ORAL | 5 refills | Status: DC

## 2019-05-28 NOTE — Telephone Encounter (Signed)
Patient returned your call.

## 2019-05-28 NOTE — Telephone Encounter (Signed)
Spoke with the patient and offered the options. He will consider the Lactulose. The Amitiza costs him $45 per month. He will let me know what he decides. Meanwhile, requests a refill on the [facility] to [facility] on [address].

## 2019-05-28 NOTE — Telephone Encounter (Signed)
Left message to call back  

## 2019-05-28 NOTE — Telephone Encounter (Signed)
Beth, if he hasn't tried Miralax then would try a capful in a glass of water daily. Otherwise, lactulose 30 grams would start with once daily then can increase to BID. It causes bloating in some people. Thanks.

## 2019-06-19 DIAGNOSIS — C44519 Basal cell carcinoma of skin of other part of trunk: Secondary | ICD-10-CM | POA: Diagnosis not present

## 2019-07-08 DIAGNOSIS — H353131 Nonexudative age-related macular degeneration, bilateral, early dry stage: Secondary | ICD-10-CM | POA: Diagnosis not present

## 2019-07-10 DIAGNOSIS — H353211 Exudative age-related macular degeneration, right eye, with active choroidal neovascularization: Secondary | ICD-10-CM | POA: Diagnosis not present

## 2019-07-10 DIAGNOSIS — H35431 Paving stone degeneration of retina, right eye: Secondary | ICD-10-CM | POA: Diagnosis not present

## 2019-07-10 DIAGNOSIS — H43813 Vitreous degeneration, bilateral: Secondary | ICD-10-CM | POA: Diagnosis not present

## 2019-07-10 DIAGNOSIS — H353223 Exudative age-related macular degeneration, left eye, with inactive scar: Secondary | ICD-10-CM | POA: Diagnosis not present

## 2019-07-15 ENCOUNTER — Telehealth: Payer: Self-pay | Admitting: Nurse Practitioner

## 2019-07-15 NOTE — Telephone Encounter (Signed)
Patient is calling in reference to his medication.

## 2019-07-15 NOTE — Telephone Encounter (Signed)
Patient cannot price the Lactulose. The pharmacy needs a prescription to run it through insurance. PT gave him a print out of exercises to do on his own. Did you know he had radiation for prostate cancer? He asks if that plays into this and should he involve Urology? Thanks

## 2019-07-15 NOTE — Telephone Encounter (Signed)
Patient is calling back  °

## 2019-07-15 NOTE — Telephone Encounter (Signed)
Spoke with the patient. He has chronic constipation. Presently on Amitiza. He does not take this BID, but rather 1 and  Half daily in order to "stretch the prescription" due to the monthly cost. Complains of the sensation that he had not completely emptied despite daily bowel movements. Calling to inquire about other options. He is interested in trying Lactulose. Also interested in pelvic floor PT.  Are these still options for him?

## 2019-07-16 NOTE — Telephone Encounter (Signed)
Radiation can cause strictures but more often would cause proctitis with bleeding. I started him on low dose Amitiza, we can try the 24 mcg if he would like. Lactulose is okay if he prefers. Can try 20 gram BID. Thanks

## 2019-07-17 ENCOUNTER — Telehealth: Payer: Self-pay | Admitting: Nurse Practitioner

## 2019-07-17 ENCOUNTER — Other Ambulatory Visit: Payer: Self-pay

## 2019-07-17 MED ORDER — LACTULOSE 20 GM/30ML PO SOLN: 20.0000 g | Freq: Two times a day (BID) | ORAL | 0 refills | Status: DC

## 2019-07-17 NOTE — Telephone Encounter (Signed)
[facility] and asked them to fill the prescription of Lactulose.

## 2019-07-17 NOTE — Telephone Encounter (Signed)
Patient had tried to return my call and I was unavailable. Called the patient back. Voicemail. The Lactulose is covered by his insurance at 0 cost to him. The pharmacy will expect the patient to call and let them know if he wants the Rx filled. Left the information from Rubbie Battiest, NP Asked the patient call me back with any questions.

## 2019-08-09 ENCOUNTER — Other Ambulatory Visit: Payer: Self-pay | Admitting: Nurse Practitioner

## 2019-08-14 NOTE — Telephone Encounter (Signed)
Fine to refill x2. Thanks

## 2019-09-02 ENCOUNTER — Telehealth: Payer: Self-pay | Admitting: Internal Medicine

## 2019-09-02 NOTE — Telephone Encounter (Signed)
FYI Pt called backed and scheduled appt for 10/22/19

## 2019-09-02 NOTE — Telephone Encounter (Signed)
Need nurse visit for Prolia injection called to make appointment for on or after 10/18/19 no answer left voicemail to call office.

## 2019-09-25 ENCOUNTER — Telehealth: Payer: Self-pay | Admitting: Nurse Practitioner

## 2019-09-25 NOTE — Telephone Encounter (Signed)
Patient called said he would like to speak with you to follow up

## 2019-09-26 NOTE — Telephone Encounter (Signed)
Patient reports he had to decrease his Lactulose due to loose stools. He has occasionally had to "push a little harder" for the bowel movements. Overall he feels this is working well. He asks if he should divide his dosing into twice daily. He asks for an appointment with Dr Hilarie Fredrickson to review his present regimen and "just be sure everything is good." Advised he did not have to divide the dosing of the Lactulose. Appointment made per patient request.

## 2019-09-29 ENCOUNTER — Encounter: Payer: Self-pay | Admitting: Emergency Medicine

## 2019-09-29 ENCOUNTER — Emergency Department: Payer: PPO

## 2019-09-29 ENCOUNTER — Other Ambulatory Visit: Payer: Self-pay

## 2019-09-29 DIAGNOSIS — Y999 Unspecified external cause status: Secondary | ICD-10-CM | POA: Insufficient documentation

## 2019-09-29 DIAGNOSIS — S60511A Abrasion of right hand, initial encounter: Secondary | ICD-10-CM | POA: Diagnosis not present

## 2019-09-29 DIAGNOSIS — Y9389 Activity, other specified: Secondary | ICD-10-CM | POA: Diagnosis not present

## 2019-09-29 DIAGNOSIS — W101XXA Fall (on)(from) sidewalk curb, initial encounter: Secondary | ICD-10-CM | POA: Diagnosis not present

## 2019-09-29 DIAGNOSIS — S199XXA Unspecified injury of neck, initial encounter: Secondary | ICD-10-CM | POA: Diagnosis not present

## 2019-09-29 DIAGNOSIS — S0993XA Unspecified injury of face, initial encounter: Secondary | ICD-10-CM | POA: Insufficient documentation

## 2019-09-29 DIAGNOSIS — R110 Nausea: Secondary | ICD-10-CM | POA: Diagnosis not present

## 2019-09-29 DIAGNOSIS — S0081XA Abrasion of other part of head, initial encounter: Secondary | ICD-10-CM | POA: Diagnosis not present

## 2019-09-29 DIAGNOSIS — W228XXA Striking against or struck by other objects, initial encounter: Secondary | ICD-10-CM | POA: Diagnosis not present

## 2019-09-29 DIAGNOSIS — Y929 Unspecified place or not applicable: Secondary | ICD-10-CM | POA: Insufficient documentation

## 2019-09-29 DIAGNOSIS — S0083XA Contusion of other part of head, initial encounter: Secondary | ICD-10-CM | POA: Insufficient documentation

## 2019-09-29 DIAGNOSIS — S0990XA Unspecified injury of head, initial encounter: Secondary | ICD-10-CM | POA: Diagnosis not present

## 2019-09-29 NOTE — ED Notes (Signed)
Family updated as to patient's status.

## 2019-09-29 NOTE — ED Triage Notes (Signed)
Patient ambulatory to triage with complaints of nausea post fall today.  Pt reports striking posterior head on luggage rack at 940 am and then 15 min later tripped on curb and fell onto face.  Pt denies LOC or blood thinners or pain but reports the onset of nausea was concerning.  Pt lives with wife at [place] with contusion and abrasion on forehead  and damage from sunglasses on the bridge of nose.  Pt has med list and "vial of Life"  Speaking in complete coherent sentences. No acute breathing distress noted.

## 2019-09-29 NOTE — ED Triage Notes (Addendum)
Pt reports taking 2 x 200 mg IBU approx noon and 1800 x 325 mg ASA  Bleeding controlled   Right hand bleeding controlled and bandaged

## 2019-09-30 ENCOUNTER — Emergency Department
Admission: EM | Admit: 2019-09-30 | Discharge: 2019-09-30 | Disposition: A | Discharge status: Home / Self Care | Payer: PPO | Attending: Emergency Medicine | Admitting: Emergency Medicine

## 2019-09-30 DIAGNOSIS — S0990XA Unspecified injury of head, initial encounter: Secondary | ICD-10-CM

## 2019-09-30 DIAGNOSIS — T148XXA Other injury of unspecified body region, initial encounter: Secondary | ICD-10-CM

## 2019-09-30 DIAGNOSIS — S0083XA Contusion of other part of head, initial encounter: Secondary | ICD-10-CM

## 2019-09-30 DIAGNOSIS — W19XXXA Unspecified fall, initial encounter: Secondary | ICD-10-CM

## 2019-09-30 MED ORDER — ONDANSETRON 4 MG PO TBDP: 4.0000 mg | ORAL_TABLET | Freq: Three times a day (TID) | ORAL | 0 refills | Status: DC | PRN

## 2019-09-30 NOTE — Discharge Instructions (Signed)
You may take Zofran as needed for nausea. Apply ice to affected area several times daily to reduce swelling. Return to the ER for worsening symptoms, persistent vomiting, lethargy or other concerns.

## 2019-09-30 NOTE — ED Provider Notes (Signed)
[facility] Emergency Department Provider Note   ____________________________________________   First MD Initiated Contact with Patient 09/30/19 [phone]     (approximate)  I have reviewed the triage vital signs and the nursing notes.   HISTORY  Chief Complaint Fall    HPI George Owens is a 86 y.o. male who presents to the ED from home status post mechanical fall.  At approximately 9:40 AM, patient was loading items in his car when he reached to prevent some objects from falling.  In the process he struck his posterior head on the luggage rack of the car.  Denies LOC.  Then 15 minutes later he tripped on a curb and fell onto his face.  Denies LOC. Denies anticoagulant use.  Only complaint is nausea.  Denies vision changes, chest pain, shortness of breath, abdominal pain, vomiting, lightheadedness or dizziness.  Tetanus is up-to-date.       Past Medical History:  Diagnosis Date  . Allergy   . Basal cell carcinoma    right temple Dr. Evorn Gong 12/2018  . Chicken pox   . Colon polyps   . GERD (gastroesophageal reflux disease)   . history of prostate CA 2006  . Hyperlipidemia   . Leg cramps   . Neuropathy   . Osteoporosis     Patient Active Problem List   Diagnosis Date Noted  . 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  . Constipation due to outlet dysfunction 02/16/2019  . Dysphagia, pharyngoesophageal phase 08/01/2018  . GERD (gastroesophageal reflux disease) 04/07/2018  . Odynophagia 12/26/2017  . Post-nasal drainage 06/28/2017  . Change in bowel function 11/20/2016  . Neuropathy 03/19/2014  . Spinal stenosis of lumbar region 02/26/2014  . Osteoporosis 10/06/2013  . Vitamin D deficiency 09/10/2013  . History of prostate cancer 03/01/2013  . Personal history of colonic polyps 02/27/2013  . Statin intolerance 02/27/2013  . Visit for preventive health examination 02/27/2013  . Hyperlipidemia   . Muscle cramps 12/13/2012    Past Surgical History:    Procedure Laterality Date  . CATARACT EXTRACTION Right   . CATARACT EXTRACTION W/PHACO Left 12/26/2017   Procedure: CATARACT EXTRACTION PHACO AND INTRAOCULAR LENS PLACEMENT ([place])  COMPLICATED LEFT;  Surgeon: Leandrew Koyanagi, MD;  Location: [facility];  Service: Ophthalmology;  Laterality: Left;  [place]  . EYE MUSCLE SURGERY Left   . TONSILLECTOMY AND ADENOIDECTOMY      Prior to Admission medications   Medication Sig Start Date End Date Taking? Authorizing Provider  Calcium Carb-Cholecalciferol (CALCIUM 1000 + D PO) Take 1 tablet by mouth daily.     [provider]  Cholecalciferol (VITAMIN D3) 400 UNITS CAPS Take 2 capsules by mouth daily.     [provider]  Denosumab (PROLIA [place]) Inject into the skin every 6 (six) months.    [provider]  glucosamine-chondroitin 500-400 MG tablet Take 1 tablet by mouth daily.    [provider]  lactulose (CHRONULAC) 10 GM/15ML solution TAKE 30 MLS (20 G TOTAL) BY MOUTH 2 (TWO) TIMES DAILY. 08/14/19   Willia Craze, NP  loratadine (CLARITIN) 10 MG tablet Take 10 mg by mouth daily as needed.     [provider]  lubiprostone (AMITIZA) 8 MCG capsule Take 1 capsule (8 mcg total) by mouth 2 (two) times daily with a meal. 05/28/19   Willia Craze, NP  Methylcellulose, Laxative, 500 MG TABS Take by mouth as directed. Takes 2 daily    [provider]  Multiple  Vitamins-Minerals (PRESERVISION AREDS PO) Take 2 capsules by mouth daily.    [provider]  naproxen (NAPROSYN) 250 MG tablet Take 250 mg by mouth as needed.    [provider]  neomycin-polymyxin b-dexamethasone (MAXITROL) 3.5-10000-0.1 OINT Every three months after eye injection 03/16/18   [provider]  omeprazole (PRILOSEC) 40 MG capsule TAKE 1 CAPSULE (40 MG TOTAL) BY MOUTH DAILY. IN THE MORNING 30 MINUTES PRIOR TO EATING 01/31/19   Crecencio Mc, MD  Ranibizumab (LUCENTIS IO) Inject  into the eye as needed. As needed every three months.    [provider]  vitamin B-12 (CYANOCOBALAMIN) 1000 MCG tablet Take 1,000 mcg by mouth daily.    [provider]    Allergies Kenalog [triamcinolone acetonide]  Family History  Problem Relation Age of Onset  . Stroke Mother   . Arthritis Father   . Heart disease Father   . Brain cancer Brother        1/2 brother (possibly related to mother NOT related to patient)  . Colon cancer Neg Hx   . Esophageal cancer Neg Hx   . Pancreatic cancer Neg Hx   . Stomach cancer Neg Hx   . Liver disease Neg Hx     Social History Social History   Tobacco Use  . Smoking status: Never Smoker  . Smokeless tobacco: Never Used  Substance Use Topics  . Alcohol use: No  . Drug use: No    Review of Systems  Constitutional: No fever/chills Eyes: No visual changes. ENT: No sore throat. Cardiovascular: Denies chest pain. Respiratory: Denies shortness of breath. Gastrointestinal: No abdominal pain.  No nausea, no vomiting.  No diarrhea.  No constipation. Genitourinary: Negative for dysuria. Musculoskeletal: Negative for back pain. Skin: Negative for rash. Neurological: Positive for minor head injury.  Negative for headaches, focal weakness or numbness.   ____________________________________________   PHYSICAL EXAM:  VITAL SIGNS: ED Triage Vitals  Enc Vitals Group     BP 09/29/19 2215 (!) 149/67     Pulse Rate 09/29/19 2215 70     Resp 09/29/19 2215 18     Temp 09/29/19 2215 97.8 F (36.6 C)     Temp Source 09/29/19 2215 Oral     SpO2 09/29/19 2215 92 %     Weight 09/29/19 2217 185 lb (83.9 kg)     Height 09/29/19 2217 6\' 4"  (1.93 m)     Head Circumference --      Peak Flow --      Pain Score --      Pain Loc --      Pain Edu? --      Excl. in [place]? --     Constitutional: Alert and oriented. Well appearing and in no acute distress. Eyes: Conjunctivae are normal. PERRL. EOMI. Head: Small forehead  hematoma. Nose: Abrasions to bridge of nose from his sunglasses. Mouth/Throat: Mucous membranes are moist.  No dental malocclusion.  Neck: No stridor.  No cervical spine tenderness to palpation. Cardiovascular: Normal rate, regular rhythm. Grossly normal heart sounds.  Good peripheral circulation. Respiratory: Normal respiratory effort.  No retractions. Lungs CTAB. Gastrointestinal: Soft and nontender to light or deep palpation. No distention. No abdominal bruits. No CVA tenderness. Musculoskeletal: Scattered abrasions to dorsal right hand.  No lower extremity tenderness nor edema.  No joint effusions. Neurologic: Alert and oriented x3.  CN II-XII grossly intact.  Normal speech and language. No gross focal neurologic deficits are appreciated. No gait instability. Skin:  Skin is warm, dry and intact. No rash noted. Psychiatric: Mood and affect are normal. Speech and behavior are normal.  ____________________________________________   LABS (all labs ordered are listed, but only abnormal results are displayed)  Labs Reviewed - No data to display ____________________________________________  EKG  None ____________________________________________  RADIOLOGY  ED MD interpretation: No ICH, no cervical spine injury, no facial bone fractures  Official radiology report(s): CT Head Wo Contrast  Result Date: 09/29/2019 CLINICAL DATA:  86 year old male with facial trauma. EXAM: CT HEAD WITHOUT CONTRAST CT MAXILLOFACIAL WITHOUT CONTRAST CT CERVICAL SPINE WITHOUT CONTRAST TECHNIQUE: Multidetector CT imaging of the head, cervical spine, and maxillofacial structures were performed using the standard protocol without intravenous contrast. Multiplanar CT image reconstructions of the cervical spine and maxillofacial structures were also generated. COMPARISON:  None FINDINGS: CT HEAD FINDINGS Brain: There is mild to moderate age-related atrophy and chronic microvascular ischemic changes. There is no acute  intracranial hemorrhage. No mass effect or midline shift. A 3.5 cm right middle cranial fossa arachnoid cyst. Vascular: No hyperdense vessel or unexpected calcification. Skull: Normal. Negative for fracture or focal lesion. Other: Mild left forehead contusion. CT MAXILLOFACIAL FINDINGS Osseous: No fracture or mandibular dislocation. No destructive process. Orbits: Negative. No traumatic or inflammatory finding. Sinuses: Clear. Soft tissues: Negative. CT CERVICAL SPINE FINDINGS Alignment: No acute subluxation. Skull base and vertebrae: Osteopenia. No acute fracture. Soft tissues and spinal canal: No prevertebral fluid or swelling. No visible canal hematoma. Disc levels: Multilevel degenerative changes. Grade 1 C4-C5 retrolisthesis. Upper chest: Biapical subpleural scarring. Other: Bilateral carotid bulb calcified plaques. IMPRESSION: 1. No acute intracranial pathology. Mild to moderate age-related atrophy and chronic microvascular ischemic changes. 2. No acute/traumatic cervical spine pathology. 3. No acute facial bone fractures. Electronically Signed   By: Anner Crete M.D.   On: 09/29/2019 23:08   CT Cervical Spine Wo Contrast  Result Date: 09/29/2019 CLINICAL DATA:  85 year old male with facial trauma. EXAM: CT HEAD WITHOUT CONTRAST CT MAXILLOFACIAL WITHOUT CONTRAST CT CERVICAL SPINE WITHOUT CONTRAST TECHNIQUE: Multidetector CT imaging of the head, cervical spine, and maxillofacial structures were performed using the standard protocol without intravenous contrast. Multiplanar CT image reconstructions of the cervical spine and maxillofacial structures were also generated. COMPARISON:  None FINDINGS: CT HEAD FINDINGS Brain: There is mild to moderate age-related atrophy and chronic microvascular ischemic changes. There is no acute intracranial hemorrhage. No mass effect or midline shift. A 3.5 cm right middle cranial fossa arachnoid cyst. Vascular: No hyperdense vessel or unexpected calcification. Skull:  Normal. Negative for fracture or focal lesion. Other: Mild left forehead contusion. CT MAXILLOFACIAL FINDINGS Osseous: No fracture or mandibular dislocation. No destructive process. Orbits: Negative. No traumatic or inflammatory finding. Sinuses: Clear. Soft tissues: Negative. CT CERVICAL SPINE FINDINGS Alignment: No acute subluxation. Skull base and vertebrae: Osteopenia. No acute fracture. Soft tissues and spinal canal: No prevertebral fluid or swelling. No visible canal hematoma. Disc levels: Multilevel degenerative changes. Grade 1 C4-C5 retrolisthesis. Upper chest: Biapical subpleural scarring. Other: Bilateral carotid bulb calcified plaques. IMPRESSION: 1. No acute intracranial pathology. Mild to moderate age-related atrophy and chronic microvascular ischemic changes. 2. No acute/traumatic cervical spine pathology. 3. No acute facial bone fractures. Electronically Signed   By: Anner Crete M.D.   On: 09/29/2019 23:08   CT MAXILLOFACIAL WO CONTRAST  Result Date: 09/29/2019 CLINICAL DATA:  86 year old male with facial trauma. EXAM: CT HEAD WITHOUT CONTRAST CT MAXILLOFACIAL WITHOUT CONTRAST CT CERVICAL SPINE WITHOUT CONTRAST TECHNIQUE: Multidetector CT imaging of the head, cervical spine,  and maxillofacial structures were performed using the standard protocol without intravenous contrast. Multiplanar CT image reconstructions of the cervical spine and maxillofacial structures were also generated. COMPARISON:  None FINDINGS: CT HEAD FINDINGS Brain: There is mild to moderate age-related atrophy and chronic microvascular ischemic changes. There is no acute intracranial hemorrhage. No mass effect or midline shift. A 3.5 cm right middle cranial fossa arachnoid cyst. Vascular: No hyperdense vessel or unexpected calcification. Skull: Normal. Negative for fracture or focal lesion. Other: Mild left forehead contusion. CT MAXILLOFACIAL FINDINGS Osseous: No fracture or mandibular dislocation. No destructive process.  Orbits: Negative. No traumatic or inflammatory finding. Sinuses: Clear. Soft tissues: Negative. CT CERVICAL SPINE FINDINGS Alignment: No acute subluxation. Skull base and vertebrae: Osteopenia. No acute fracture. Soft tissues and spinal canal: No prevertebral fluid or swelling. No visible canal hematoma. Disc levels: Multilevel degenerative changes. Grade 1 C4-C5 retrolisthesis. Upper chest: Biapical subpleural scarring. Other: Bilateral carotid bulb calcified plaques. IMPRESSION: 1. No acute intracranial pathology. Mild to moderate age-related atrophy and chronic microvascular ischemic changes. 2. No acute/traumatic cervical spine pathology. 3. No acute facial bone fractures. Electronically Signed   By: Anner Crete M.D.   On: 09/29/2019 23:08    ____________________________________________   PROCEDURES  Procedure(s) performed (including Critical Care):  Procedures   ____________________________________________   INITIAL IMPRESSION / ASSESSMENT AND PLAN / ED COURSE  As part of my medical decision making, I reviewed the following data within the [facility] notes reviewed and incorporated, Old chart reviewed, Radiograph reviewed and Notes from prior ED visits     George Owens was evaluated in Emergency Department on 09/30/2019 for the symptoms described in the history of present illness. He was evaluated in the context of the global COVID-19 pandemic, which necessitated consideration that the patient might be at risk for infection with the SARS-CoV-2 virus that causes COVID-19. Institutional protocols and algorithms that pertain to the evaluation of patients at risk for COVID-19 are in a state of rapid change based on information released by regulatory bodies including the CDC and federal and state organizations. These policies and algorithms were followed during the patient's care in the ED.    86 year old male who presents approximately 15 hours status post  mechanical fall with minor head injury and facial contusions.  Differential diagnosis includes but is not limited to [place], cervical spine fracture, facial fracture, etc.  CT imaging studies of the head/C-spine/maxillofacial negative for acute traumatic injury.  Patient denies nausea currently.  He is neurologically intact without focal deficits.  Strict return precautions given.  Patient verbalizes understanding and agrees with plan of care.      ____________________________________________   FINAL CLINICAL IMPRESSION(S) / ED DIAGNOSES  Final diagnoses:  Fall, initial encounter  Injury of head, initial encounter  Contusion of face, initial encounter  Abrasion     ED Discharge Orders    None       Note:  This document was prepared using Dragon voice recognition software and may include unintentional dictation errors.   Paulette Blanch, MD 09/30/19 [phone]

## 2019-10-01 ENCOUNTER — Ambulatory Visit: Payer: PPO | Admitting: Nurse Practitioner

## 2019-10-01 ENCOUNTER — Encounter: Payer: Self-pay | Admitting: Nurse Practitioner

## 2019-10-01 ENCOUNTER — Telehealth: Payer: Self-pay

## 2019-10-01 ENCOUNTER — Telehealth: Payer: Self-pay | Admitting: Internal Medicine

## 2019-10-01 ENCOUNTER — Other Ambulatory Visit: Payer: Self-pay

## 2019-10-01 VITALS — BP 102/60 | HR 71 | Temp 96.5°F | Resp 15 | Ht 76.0 in | Wt 188.4 lb

## 2019-10-01 DIAGNOSIS — K921 Melena: Secondary | ICD-10-CM

## 2019-10-01 DIAGNOSIS — K5900 Constipation, unspecified: Secondary | ICD-10-CM

## 2019-10-01 LAB — CBC
HCT: 40 % (ref 39.0–52.0)
Hemoglobin: 13.7 g/dL (ref 13.0–17.0)
MCHC: 34.2 g/dL (ref 30.0–36.0)
MCV: 90.6 fl (ref 78.0–100.0)
Platelets: 142 K/uL — ABNORMAL LOW (ref 150.0–400.0)
RBC: 4.41 Mil/uL (ref 4.22–5.81)
RDW: 13.7 % (ref 11.5–15.5)
WBC: 5.1 K/uL (ref 4.0–10.5)

## 2019-10-01 LAB — BASIC METABOLIC PANEL
BUN: 18 mg/dL (ref 6–23)
CO2: 31 mEq/L (ref 19–32)
Calcium: 8.8 mg/dL (ref 8.4–10.5)
Chloride: 102 mEq/L (ref 96–112)
Creatinine, Ser: 0.94 mg/dL (ref 0.40–1.50)
GFR: 76.2 mL/min (ref >60.00)
Glucose, Bld: 99 mg/dL (ref 70–99)
Potassium: 4.5 mEq/L (ref 3.5–5.1)
Sodium: 139 mEq/L (ref 135–145)

## 2019-10-01 NOTE — Telephone Encounter (Signed)
I was told to inform the providers every time [facility] triages a patient of theirs. When you said I did not see a message I resent it to you.

## 2019-10-01 NOTE — Telephone Encounter (Signed)
[place] RECORD [facility] Patient Name: SRIANSH LIZARDI Gender: Male DOB: 1932/04/25 Age: 86 Y 59 M 27 D Return Phone Number: [phone] (Primary), [phone] (Secondary) Address: City/State/Zip: [place] [address] Client [facility] - [facility]ie Client Site [place] - Day Physician Deborra Medina - MD Contact Type Call Who Is Calling Patient / Member / Family / Caregiver Call Type Triage / Clinical Relationship To Patient Self Return Phone Number [phone] (Primary) Chief Complaint Rectal Bleeding Reason for Call Symptomatic / Request for Health Information Initial Comment Caller was seen seen in the ER on Sunday for a concussion and now he is having rectal bleeding Translation No Nurse Assessment Nurse: [name], RN, [name] Date/Time [name] Time): 10/01/2019 8:18:21 AM Confirm and document reason for call. If symptomatic, describe symptoms. ---Caller was seen seen in the ER on Sunday for a concussion from fall and now he is having rectal bleeding. Hx of hemorrhoids for years. Hx of constipation. Had some spotting in stool. No clots. Lactulose daily tablespoon. Never passing blood without stool. May 5th GI Dr. appt. Yesterday, 3x major. Has the patient had close contact with a person known or suspected to have the novel coronavirus illness OR traveled / lives in area with major community spread (including international travel) in the last 14 days from the onset of symptoms? * If Asymptomatic, screen for exposure and travel within the last 14 days. ---No Does the patient have any new or worsening symptoms? ---Yes Will a triage be completed? ---Yes Related visit to physician within the last 2 weeks? ---No Does the PT have any chronic conditions? (i.e. diabetes, asthma, this includes High risk factors for pregnancy, etc.) ---Yes List chronic  conditions. ---vaccines done, constipation, lactulose, allergies, claritin, hemorrhoids, Is this a behavioral health or substance abuse call? ---No Guidelines Guideline Title Affirmed Question Affirmed Notes Nurse Date/Time (Eastern Time) Rectal Bleeding MODERATE rectal bleeding (small blood clots, passing blood [name] 10/01/2019 8:24:52 AMPLEASE NOTE: All timestamps contained within this report are represented as [place] Standard Time. CONFIDENTIALTY NOTICE: This fax transmission is intended only for the addressee. It contains information that is legally privileged, confidential or otherwise protected from use or disclosure. If you are not the intended recipient, you are strictly prohibited from reviewing, disclosing, copying using or disseminating any of this information or taking any action in reliance on or regarding this information. If you have received this fax in error, please notify us immediately by telephone so that we can arrange for its return to [place]. Phone: [phone], Toll-Free: [phone], Fax: [phone] Page: 2 of 2 Call Id: [id number] Guidelines Guideline Title Affirmed Question Affirmed Notes Nurse Date/Time [name] Time) without stool, or toilet water turns red) Disp. Time [name] Time) Disposition Final User 10/01/2019 8:26:47 AM See PCP within 24 Hours Yes [name], RN, [name] Disagree/Comply Comply Caller Understands Yes PreDisposition Call Doctor Care Advice Given Per Guideline SEE PCP WITHIN 24 HOURS: * IF OFFICE WILL BE OPEN: You need to be seen within the next 24 hours. Call your doctor (or NP/PA) when the office opens and make an appointment. CARE ADVICE given per Rectal Bleeding (Adult) guideline. BRING MEDICINES: * Please bring a list of your current medicines when you go to see the doctor. CALL BACK IF: * Bleeding increases * Dizziness occurs * You become worse. Referrals Warm transfer to backline

## 2019-10-01 NOTE — Telephone Encounter (Signed)
[facility] RECORD [facility]urse Patient Name: George Owens Gender: Male DOB: 1932-05-31 Age: 86 Y 74 M 27 D Return Phone Number: [phone] (Primary), [phone] (Secondary) Address: City/State/Zip: [place] [address] Client [name] Day - Clie Client Site [place] - Day Physician Deborra Medina - MD Contact Type Call Who Is Calling Patient / Member / Family / Caregiver Call Type Triage / Clinical Relationship To Patient Self Return Phone Number [phone] (Primary) Chief Complaint Rectal Bleeding Reason for Call Symptomatic / Request for Health Information Initial Comment Caller was seen seen in the ER on Sunday for a concussion and now he is having rectal bleeding Translation No Nurse Assessment Nurse: [name], RN, [name] Date/Time [name] Time): 10/01/2019 8:18:21 AM Confirm and document reason for call. If symptomatic, describe symptoms. ---Caller was seen seen in the ER on Sunday for a concussion from fall and now he is having rectal bleeding. Hx of hemorrhoids for years. Hx of constipation. Had some spotting in stool. No clots. Lactulose daily tablespoon. Never passing blood without stool. May 5th GI Dr. appt. Yesterday, 3x major. Has the patient had close contact with a person known or suspected to have the novel coronavirus illness OR traveled / lives in area with major community spread (including international travel) in the last 14 days from the onset of symptoms? * If Asymptomatic, screen for exposure and travel within the last 14 days. ---No Does the patient have any new or worsening symptoms? ---Yes Will a triage be completed? ---Yes Related visit to physician within the last 2 weeks? ---No Does the PT have any chronic conditions? (i.e. diabetes, asthma, this includes High risk factors for pregnancy, etc.) ---Yes List chronic  conditions. ---vaccines done, constipation, lactulose, allergies, claritin, hemorrhoids, Is this a behavioral health or substance abuse call? ---No Guidelines Guideline Title Affirmed Question Affirmed Notes Nurse Date/Time (Eastern Time) Rectal Bleeding MODERATE rectal bleeding (small blood clots, passing blood [name] 10/01/2019 8:24:52 AMPLEASE NOTE: All timestamps contained within this report are represented as [place] Standard Time. CONFIDENTIALTY NOTICE: This fax transmission is intended only for the addressee. It contains information that is legally privileged, confidential or otherwise protected from use or disclosure. If you are not the intended recipient, you are strictly prohibited from reviewing, disclosing, copying using or disseminating any of this information or taking any action in reliance on or regarding this information. If you have received this fax in error, please notify us immediately by telephone so that we can arrange for its return to [place]. Phone: [phone], Toll-Free: [phone], Fax: [phone] Page: 2 of 2 Call Id: [id number] Guidelines Guideline Title Affirmed Question Affirmed Notes Nurse Date/Time [name] Time) without stool, or toilet water turns red) Disp. Time [name] Time) Disposition Final User 10/01/2019 8:26:47 AM See PCP within 24 Hours Yes [name], RN, [name] Disagree/Comply Comply Caller Understands Yes PreDisposition Call Doctor Care Advice Given Per Guideline SEE PCP WITHIN 24 HOURS: * IF OFFICE WILL BE OPEN: You need to be seen within the next 24 hours. Call your doctor (or NP/PA) when the office opens and make an appointment. CARE ADVICE given per Rectal Bleeding (Adult) guideline. BRING MEDICINES: * Please bring a list of your current medicines when you go to see the doctor. CALL BACK IF: * Bleeding increases * Dizziness occurs * You become worse. Referrals Warm transfer to backline

## 2019-10-01 NOTE — Progress Notes (Signed)
Established Patient Office Visit  Subjective:  Patient ID: George Owens, male    DOB: 1931/09/04  Age: 86 y.o. MRN: [id number]  CC:  Chief Complaint  Patient presents with  . Acute Visit    blood in stool since Saturday    HPI George Owens presents for rectal bleeding. He has a hx of constipation and is followed by Dr. Hilarie Fredrickson.  Onset Sat AM noted a small amount of blood after wiped form a BM. He attributed it to a hemorrhoid just inside the rectum that has been present for a long time.  He took his usual lactulose, and had normal bowel movement. Sunday morning he felt irritation in the rectum because of frequent bowel movements and noticed he felt sore with wiping.  He used a OTC hemorrhoid suppository.   Yesterday, he had a bowel movement with moderate blood in the commode, and streaked in his bowel movement.  Passed reddish stain in his stool, and fresh blood in the commode.  He had no abdominal pain cramps bloating.  Normal diet and appetite. He spoke to the [facility] and was advised to monitor for 24 hours. He had a second stool at 1600 with more fresh blood -more blood in commode and in the stool and another small BM after dinner and passed the most blood. He wiped every few min and had scant  fresh blood on his tissue. No further BM last night.  This morning, no Lactulose yet and could not pass a BM and he put tissue on his index finger and performed a digital check and saw fresh blood on his finger.  No abdominal pain has been eating a normal diet including salads.  He says he feels fine today. Last BM was yesterday.  He has had no dizziness, lightheadedness, or weakness.  He felt a little weak yest but attributed it to the fall on SUN when  tripped while walking and fell flat on his face. Face bruised by sunglasses. No LOC. No anticoagulant use.  He was seen in the emergency department yesterday and had negative CT imaging studies of the head/C-spine/maxillofacial negative  for acute traumatic injury.  He was neurologically intact without focal deficits.  BP Readings from Last 3 Encounters:  10/01/19 102/60  09/30/19 120/76  05/02/19 116/72   10/01/2019 orthostatic vital signs: Supine 120/68, HR  70 Sitting 100/70,   HR 71  Standing 100/62, HR 81  Lab Results  Component Value Date   WBC 5.9 04/19/2019   HGB 13.9 04/19/2019   HCT 41.5 04/19/2019   MCV 90.4 04/19/2019   PLT 172 04/19/2019   Past Medical History:  Diagnosis Date  . Allergy   . Basal cell carcinoma    right temple Dr. Evorn Gong 12/2018  . Chicken pox   . Colon polyps   . GERD (gastroesophageal reflux disease)   . history of prostate CA 2006  . Hyperlipidemia   . Leg cramps   . Neuropathy   . Osteoporosis     Past Surgical History:  Procedure Laterality Date  . CATARACT EXTRACTION Right   . CATARACT EXTRACTION W/PHACO Left 12/26/2017   Procedure: CATARACT EXTRACTION PHACO AND INTRAOCULAR LENS PLACEMENT ([place])  COMPLICATED LEFT;  Surgeon: Leandrew Koyanagi, MD;  Location: [facility];  Service: Ophthalmology;  Laterality: Left;  [place]  . EYE MUSCLE SURGERY Left   . TONSILLECTOMY AND ADENOIDECTOMY      Family History  Problem Relation Age of Onset  .  Stroke Mother   . Arthritis Father   . Heart disease Father   . Brain cancer Brother        1/2 brother (possibly related to mother NOT related to patient)  . Colon cancer Neg Hx   . Esophageal cancer Neg Hx   . Pancreatic cancer Neg Hx   . Stomach cancer Neg Hx   . Liver disease Neg Hx     Social History   Socioeconomic History  . Marital status: Married    Spouse name: Not on file  . Number of children: 4  . Years of education: Not on file  . Highest education level: Not on file  Occupational History  . Occupation: retired  Tobacco Use  . Smoking status: Never Smoker  . Smokeless tobacco: Never Used  Substance and Sexual Activity  . Alcohol use: No  . Drug use: No  . Sexual activity:  Yes    Partners: Female  Other Topics Concern  . Not on file  Social History Narrative   Lives with wife in a 5 story retirement home.  He lives on the first floor.  4 year college degree.  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     Social Determinants of Health   Financial Resource Strain: Low Risk   . Difficulty of Paying Living Expenses: Not hard at all  Food Insecurity: No Food Insecurity  . Worried About Charity fundraiser in the Last Year: Never true  . Ran Out of Food in the Last Year: Never true  Transportation Needs: No Transportation Needs  . Lack of Transportation (Medical): No  . Lack of Transportation (Non-Medical): No  Physical Activity: Sufficiently Active  . Days of Exercise per Week: 5 days  . Minutes of Exercise per Session: 30 min  Stress: No Stress Concern Present  . Feeling of Stress : Not at all  Social Connections:   . Frequency of Communication with Friends and Family:   . Frequency of Social Gatherings with Friends and Family:   . Attends Religious Services:   . Active Member of Clubs or Organizations:   . Attends Archivist Meetings:   [name] Marital Status:   Intimate Partner Violence: Not At Risk  . Fear of Current or Ex-Partner: No  . Emotionally Abused: No  . Physically Abused: No  . Sexually Abused: No    Outpatient Medications Prior to Visit  Medication Sig Dispense Refill  . Calcium Carb-Cholecalciferol (CALCIUM 1000 + D PO) Take 1 tablet by mouth daily.     . Cholecalciferol (VITAMIN D3) 400 UNITS CAPS Take 2 capsules by mouth daily.     . Denosumab (PROLIA Theodore) Inject into the skin every 6 (six) months.    [name] glucosamine-chondroitin 500-400 MG tablet Take 1 tablet by mouth daily.    [name] lactulose (CHRONULAC) 10 GM/15ML solution TAKE 30 MLS (20 G TOTAL) BY MOUTH 2 (TWO) TIMES DAILY. 1892 mL 3  . loratadine (CLARITIN) 10 MG tablet Take 10 mg by mouth daily as needed.     . Methylcellulose, Laxative, 500 MG TABS  Take by mouth as directed. Takes 2 daily    . Multiple Vitamins-Minerals (PRESERVISION AREDS PO) Take 2 capsules by mouth daily.    . naproxen (NAPROSYN) 250 MG tablet Take 250 mg by mouth as needed.    [name] omeprazole (PRILOSEC) 40 MG capsule TAKE 1 CAPSULE (40 MG TOTAL) BY MOUTH DAILY. IN THE MORNING 30 MINUTES PRIOR TO  EATING 90 capsule 3  . ondansetron (ZOFRAN ODT) 4 MG disintegrating tablet Take 1 tablet (4 mg total) by mouth every 8 (eight) hours as needed for nausea or vomiting. 20 tablet 0  . Ranibizumab (LUCENTIS IO) Inject into the eye as needed. As needed every three months.    . vitamin B-12 (CYANOCOBALAMIN) 1000 MCG tablet Take 1,000 mcg by mouth daily.    [name] lubiprostone (AMITIZA) 8 MCG capsule Take 1 capsule (8 mcg total) by mouth 2 (two) times daily with a meal. (Patient not taking: Reported on 10/01/2019) 60 capsule 5  . neomycin-polymyxin b-dexamethasone (MAXITROL) 3.5-10000-0.1 OINT Every three months after eye injection     No facility-administered medications prior to visit.    Allergies  Allergen Reactions  . Kenalog [Triamcinolone Acetonide]     Blindness X 3 Days    ROS Review of Systems  Constitutional: Negative for chills, diaphoresis, fatigue and fever.  HENT: Negative for congestion.        Fell with bruising about the face under eyes and where his sunglasses hit him.  Throat is a little scratchy at times-not sore.  Respiratory: Negative for cough and shortness of breath.   Cardiovascular: Negative for chest pain and palpitations.       Negative feeling faint or syncope  Gastrointestinal: Positive for blood in stool, constipation and rectal pain. Negative for abdominal distention, abdominal pain, diarrhea, nausea and vomiting.  Endocrine: Negative.   Genitourinary: Negative for difficulty urinating and hematuria.  Musculoskeletal: Negative for back pain and gait problem.  Skin: Negative for pallor and rash.  Neurological: Negative for dizziness, syncope,  weakness and headaches.  Hematological: Negative for adenopathy. Does not bruise/bleed easily.  Psychiatric/Behavioral:       No depression/anxiety concerns.       Objective:    Physical Exam  Constitutional: He is oriented to person, place, and time. He appears well-developed and well-nourished.  HENT:  Head: Normocephalic.  Bruising noted around and under the eyes- s/w face fall and sunglasses. Minor trauma- see HPI.   Eyes: Pupils are equal, round, and reactive to light. Conjunctivae are normal.  Cardiovascular: Normal rate, regular rhythm and normal heart sounds.  Pulmonary/Chest: Effort normal and breath sounds normal.  Abdominal: Soft. Bowel sounds are normal. He exhibits no distension. There is no abdominal tenderness.  Genitourinary:    Genitourinary Comments: DRE shows external hemorrhoid tags, a little irritated, with normal sphincter tone.  The rectal wall is smooth, formed stool in the vault, and the stool is a light tan-brown, no sign of fresh blood on examiner's glove.  Stool was Hemoccult positive.  No maroon stool, melena, or bleeding hemorrhoids noted.  Patient reported no tenderness   Musculoskeletal:        General: Normal range of motion.     Cervical back: Normal range of motion and neck supple.  Neurological: He is alert and oriented to person, place, and time.  Skin: Skin is warm and dry.  Psychiatric: He has a normal mood and affect. His behavior is normal. Judgment and thought content normal.    BP 102/60 (BP Location: Left Arm, Patient Position: Sitting, Cuff Size: Normal)   Pulse 71   Temp (!) 96.5 F (35.8 C) (Temporal)   Resp 15   Ht 6\' 4"  (1.93 m)   Wt 188 lb 6.4 oz (85.5 kg)   SpO2 99%   BMI 22.93 kg/m  Wt Readings from Last 3 Encounters:  10/01/19 188 lb 6.4 oz (85.5 kg)  09/29/19 185 lb (83.9 kg)  05/02/19 185 lb (83.9 kg)    Lab Results  Component Value Date   TSH 3.92 04/19/2019   Lab Results  Component Value Date   WBC 5.9  04/19/2019   HGB 13.9 04/19/2019   HCT 41.5 04/19/2019   MCV 90.4 04/19/2019   PLT 172 04/19/2019      Assessment & Plan:   Problem List Items Addressed This Visit    None    Visit Diagnoses    Hematochezia    -  Primary   Relevant Orders   Basic Metabolic Panel (BMET)   CBC   Constipation, unspecified constipation type         4 day hx of rectal bleeding - first 2 days scant on tissue and the last 2 days he has seen fresh blood in the commode that appears to have resolved. The digital rectal exam now  shows formed stool, and no blood on examiner's finger.  Hemoccult positive.  He does have hemorrhoid tags that are irritated, and has been using over-the-counter hemorrhoid suppository.  He has constipation.  He performed a digital manipulation today which may have aggravated his hemorrhoids and caused fresh blood.  Also need to consider a mild diverticular bleed, and less likely malignancy.  He has no abdominal pain or tenderness and his abdominal exam was totally normal. Do not suspect active diverticulitis, ischemic colitis, or inflammatory bowel disease.   He does need to consult with his gastroenterologist and already has one arranged in a few weeks for GERD complaint.    Patient advised to avoid Naprosyn right now, continue with Prilosec, go on a full to very soft diet today to rest the bowel, stay very well-hydrated.  Avoid nuts and seeds, garden raw fruits and vegetables and salads.  Use Anusol suppositories twice a day, and discontinue the practice of digital manipulation of constipation.  He can hold his lactulose today, as he passed a good bowel movement yesterday and is not constipated.  He was advised to hydrate very well, as orthostatic vital signs are slightly positive. However, he felt no dizziness, lightheadedness,  weakness, chest pain,  shortness of breath and pulse ox is 99%.  He says he feels totally well.  Hemoglobin and Bmet returned in his normal baseline.  I reviewed  the patient instruction sheet with him, and alarm signs to report to the emergency room.  He  voices understanding.  This visit occurred during the SARS-CoV-2 public health emergency.  Safety protocols were in place, including screening questions prior to the visit, additional usage of staff PPE, and extensive cleaning of exam room while observing appropriate contact time as indicated for disinfecting solutions.   A total of 45 minutes of face to face time was spent with patient more than half of which was spent in counselling about the above mentioned conditions and coordination of care   Follow-up:  Telephone call tomorrow for an update. He has gastroenterology appointment in 3 weeks. We will request a sooner appt if possible.    Denice Paradise, NP

## 2019-10-01 NOTE — Telephone Encounter (Signed)
Transferred to [facility] called he is having rectal bleeding and was in the ED on Sunday for a concussion

## 2019-10-01 NOTE — Telephone Encounter (Signed)
George Owens from Access nurse called office, patient has been scheduled for today at [facility] with Dawson Bills, NP.

## 2019-10-01 NOTE — Telephone Encounter (Signed)
[place]!  YOU ARE NOT SUPPOSED TO BE FORWARDING THE  RUN ON MESSAGES.    WHAT IS THE POINT?  [place]

## 2019-10-01 NOTE — Patient Instructions (Addendum)
It was very nice meeting you today.  The likely cause of your painless bleeding is diverticulosis, possibly hemorrhoid.  You have no abdominal pain or tenderness so that is good.  My rectal exam showed no visible blood on my examination.  It is possible that your bleeding has stopped.  You could begin your lactulose tomorrow if you have not had a bowel movement.  Use the Anusol suppositories for hemorrhoid as directed.  Do not do any digital disimpaction.   For your rectal bleeding, we are waiting for the results of the blood work today.  We will call you with results as soon as we get those back.   I want you to take it easy, hydrate, and go on a full liquid diet with pudding, Ensure or boost, milkshakes, yogurt, scrambled eggs, and monitor for any further rectal bleeding.  If the rectal bleeding begins again today,  please call back. You may pass old looking-brown blood.  Get help right away if:  You have new or increased rectal bleeding.  You have black or dark red stools.  You vomit blood or something that looks like coffee grounds.  You have pain or tenderness in your abdomen.  You have a fever.  You feel weak.  You feel nauseous.  You faint.  You have severe pain in your rectum.  You cannot have a bowel movement   He still has no abdominal pain and has been eating a normal diet including salads.

## 2019-10-01 NOTE — Telephone Encounter (Signed)
George Owens.  There is no message from you attached  To this note  .

## 2019-10-02 ENCOUNTER — Ambulatory Visit: Payer: PPO | Admitting: Physician Assistant

## 2019-10-02 ENCOUNTER — Encounter: Payer: Self-pay | Admitting: Physician Assistant

## 2019-10-02 VITALS — BP 122/60 | HR 84 | Temp 97.5°F | Ht 76.0 in | Wt 188.0 lb

## 2019-10-02 DIAGNOSIS — H35433 Paving stone degeneration of retina, bilateral: Secondary | ICD-10-CM | POA: Diagnosis not present

## 2019-10-02 DIAGNOSIS — K5900 Constipation, unspecified: Secondary | ICD-10-CM

## 2019-10-02 DIAGNOSIS — H353211 Exudative age-related macular degeneration, right eye, with active choroidal neovascularization: Secondary | ICD-10-CM | POA: Diagnosis not present

## 2019-10-02 DIAGNOSIS — K625 Hemorrhage of anus and rectum: Secondary | ICD-10-CM | POA: Diagnosis not present

## 2019-10-02 DIAGNOSIS — H43813 Vitreous degeneration, bilateral: Secondary | ICD-10-CM | POA: Diagnosis not present

## 2019-10-02 DIAGNOSIS — H353223 Exudative age-related macular degeneration, left eye, with inactive scar: Secondary | ICD-10-CM | POA: Diagnosis not present

## 2019-10-02 MED ORDER — CLENPIQ 10-3.5-12 MG-GM -GM/160ML PO SOLN: 1.0000 | ORAL | 0 refills | Status: DC

## 2019-10-02 NOTE — Progress Notes (Signed)
Chief Complaint: Rectal bleeding  HPI:    [name] is an 86 year old male with a past medical history as listed below, known to Dr. Elmo Putt, who was referred to me by Crecencio Mc, MD for a complaint of rectal bleeding.      02/28/2008 history of a normal colonoscopy.    05/02/2019 patient saw Tye Savoy for constipation.  At that time given a trial of Amitiza 8 mcg.  Discussed if there is no improvement then could consider colonoscopy.    10/01/2019 patient saw PCP for an acute visit of rectal bleeding.  Described that on Saturday morning, 09/28/2019 he had a small amount of blood after he wiped and a bowel movement.  He attributed this to hemorrhoids just inside his rectum.  He took his usual lactulose and had a normal bowel movement.  Sunday morning he felt irritation in the rectum because of frequent bowel movements and noticed he felt sore with wiping.  He used an over-the-counter hemorrhoid suppository.  09/30/2019 patient had a bowel movement moderate blood in the commode and streaked in his bowel movement.  He had a second stool at 4:00 with more fresh blood, more blood in the commode and in the stool and another small bowel movement after dinner and passed most blood.  He wiped every few minutes and had scant fresh blood in his tissue.  Yesterday morning had blood on the toilet paper when checking.  DRE showed external hemorrhoid tags.  No sign of fresh blood.  Stool was Hemoccult positive.  He was instructed to use Anusol suppositories twice a day and discontinue the practice of digital manipulation.  Hemoglobin and BMP normal.    Today, the patient presents to clinic and describes the history as above.  Since he was seen in clinic there he had a small amount of blood last night and this morning when he wiped just a trace amount.  There was none in his stool.  Patient tells me it seems to be slowing down.  He was very confident in the exam he had done by the nurse practitioner at his PCPs  office and tells me that she used to work for gastroenterologist.  Lytle Michaels that he is worried that he has cancer and this is what has been bleeding especially with his history of prostate cancer.  Tells me that he plans on living at least 10 more years.    Denies abdominal pain, heartburn, reflux or rectal pain.  Past Medical History:  Diagnosis Date  . Allergy   . Basal cell carcinoma    right temple Dr. Evorn Gong 12/2018  . Chicken pox   . Colon polyps   . GERD (gastroesophageal reflux disease)   . history of prostate CA 2006  . Hyperlipidemia   . Leg cramps   . Neuropathy   . Osteoporosis     Past Surgical History:  Procedure Laterality Date  . CATARACT EXTRACTION Right   . CATARACT EXTRACTION W/PHACO Left 12/26/2017   Procedure: CATARACT EXTRACTION PHACO AND INTRAOCULAR LENS PLACEMENT ([place])  COMPLICATED LEFT;  Surgeon: Leandrew Koyanagi, MD;  Location: [facility];  Service: Ophthalmology;  Laterality: Left;  [place]  . EYE MUSCLE SURGERY Left   . TONSILLECTOMY AND ADENOIDECTOMY      Current Outpatient Medications  Medication Sig Dispense Refill  . Calcium Carb-Cholecalciferol (CALCIUM 1000 + D PO) Take 1 tablet by mouth daily.     . Cholecalciferol (VITAMIN D3) 400 UNITS CAPS Take 2 capsules  by mouth daily.     . Denosumab (PROLIA [place]) Inject into the skin every 6 (six) months.    [name] glucosamine-chondroitin 500-400 MG tablet Take 1 tablet by mouth daily.    [name] lactulose (CHRONULAC) 10 GM/15ML solution TAKE 30 MLS (20 G TOTAL) BY MOUTH 2 (TWO) TIMES DAILY. 1892 mL 3  . loratadine (CLARITIN) 10 MG tablet Take 10 mg by mouth daily as needed.     . Methylcellulose, Laxative, 500 MG TABS Take by mouth as directed. Takes 2 daily    . Multiple Vitamins-Minerals (PRESERVISION AREDS PO) Take 2 capsules by mouth daily.    . naproxen (NAPROSYN) 250 MG tablet Take 250 mg by mouth as needed.    [name] omeprazole (PRILOSEC) 40 MG capsule TAKE 1 CAPSULE (40 MG TOTAL) BY MOUTH  DAILY. IN THE MORNING 30 MINUTES PRIOR TO EATING 90 capsule 3  . ondansetron (ZOFRAN ODT) 4 MG disintegrating tablet Take 1 tablet (4 mg total) by mouth every 8 (eight) hours as needed for nausea or vomiting. 20 tablet 0  . Ranibizumab (LUCENTIS IO) Inject into the eye as needed. As needed every three months.    . vitamin B-12 (CYANOCOBALAMIN) 1000 MCG tablet Take 1,000 mcg by mouth daily.     No current facility-administered medications for this visit.    Allergies as of 10/02/2019 - Review Complete 10/01/2019  Allergen Reaction Noted  . Kenalog [triamcinolone acetonide]  12/13/2012    Family History  Problem Relation Age of Onset  . Stroke Mother   . Arthritis Father   . Heart disease Father   . Brain cancer Brother        1/2 brother (possibly related to mother NOT related to patient)  . Colon cancer Neg Hx   . Esophageal cancer Neg Hx   . Pancreatic cancer Neg Hx   . Stomach cancer Neg Hx   . Liver disease Neg Hx     Social History   Socioeconomic History  . Marital status: Married    Spouse name: Not on file  . Number of children: 4  . Years of education: Not on file  . Highest education level: Not on file  Occupational History  . Occupation: retired  Tobacco Use  . Smoking status: Never Smoker  . Smokeless tobacco: Never Used  Substance and Sexual Activity  . Alcohol use: No  . Drug use: No  . Sexual activity: Yes    Partners: Female  Other Topics Concern  . Not on file  Social History Narrative   Lives with wife in a 5 story retirement home.  He lives on the first floor.  4 year college degree.  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     Social Determinants of Health   Financial Resource Strain: Low Risk   . Difficulty of Paying Living Expenses: Not hard at all  Food Insecurity: No Food Insecurity  . Worried About Charity fundraiser in the Last Year: Never true  . Ran Out of Food in the Last Year: Never true  Transportation  Needs: No Transportation Needs  . Lack of Transportation (Medical): No  . Lack of Transportation (Non-Medical): No  Physical Activity: Sufficiently Active  . Days of Exercise per Week: 5 days  . Minutes of Exercise per Session: 30 min  Stress: No Stress Concern Present  . Feeling of Stress : Not at all  Social Connections:   . Frequency of Communication with Friends and Family:   .  Frequency of Social Gatherings with Friends and Family:   . Attends Religious Services:   . Active Member of Clubs or Organizations:   . Attends Archivist Meetings:   [name] Marital Status:   Intimate Partner Violence: Not At Risk  . Fear of Current or Ex-Partner: No  . Emotionally Abused: No  . Physically Abused: No  . Sexually Abused: No    Review of Systems:    Constitutional: No weight loss, fever or chills Cardiovascular: No chest pain  Respiratory: No SOB  Gastrointestinal: See HPI and otherwise negative   Physical Exam:  Vital signs: BP 122/60   Pulse 84   Temp (!) 97.5 F (36.4 C)   Ht 6\' 4"  (1.93 m)   Wt 188 lb (85.3 kg)   BMI 22.88 kg/m   Constitutional:   Pleasant Elderly Caucasian male appears to be in NAD, Well developed, Well nourished, alert and cooperative Respiratory: Respirations even and unlabored. Lungs clear to auscultation bilaterally.   No wheezes, crackles, or rhonchi.  Cardiovascular: Normal S1, S2. No MRG. Regular rate and rhythm. No peripheral edema, cyanosis or pallor.  Gastrointestinal:  Soft, nondistended, nontender. No rebound or guarding. Normal bowel sounds. No appreciable masses or hepatomegaly. Rectal:  Declined-see HPI- exam at PCP office yesterday Psychiatric:  Demonstrates good judgement and reason without abnormal affect or behaviors.  RELEVANT LABS AND IMAGING: CBC    Component Value Date/Time   WBC 5.1 10/01/2019 1000   RBC 4.41 10/01/2019 1000   HGB 13.7 10/01/2019 1000   HGB 14.0 05/29/2014 0917   HCT 40.0 10/01/2019 1000   HCT 41.9  05/29/2014 0917   PLT 142.0 (L) 10/01/2019 1000   PLT 134 (L) 05/29/2014 0917   MCV 90.6 10/01/2019 1000   MCV 91 05/29/2014 0917   MCH 30.3 04/19/2019 1544   MCHC 34.2 10/01/2019 1000   RDW 13.7 10/01/2019 1000   RDW 13.4 05/29/2014 0917   LYMPHSABS 1,752 04/19/2019 1544   LYMPHSABS 1.2 05/29/2014 0917   MONOABS 0.4 08/01/2018 1427   MONOABS 0.3 05/29/2014 0917   EOSABS 100 04/19/2019 1544   EOSABS 0.1 05/29/2014 0917   BASOSABS 18 04/19/2019 1544   BASOSABS 0.0 05/29/2014 0917    CMP     Component Value Date/Time   NA 139 10/01/2019 1000   NA 145 05/29/2014 0917   K 4.5 10/01/2019 1000   K 3.6 05/29/2014 0917   CL 102 10/01/2019 1000   CL 105 05/29/2014 0917   CO2 31 10/01/2019 1000   CO2 33 (H) 05/29/2014 0917   GLUCOSE 99 10/01/2019 1000   GLUCOSE 116 (H) 05/29/2014 0917   BUN 18 10/01/2019 1000   BUN 20 (H) 05/29/2014 0917   CREATININE 0.94 10/01/2019 1000   CREATININE 0.83 04/19/2019 1544   CALCIUM 8.8 10/01/2019 1000   CALCIUM 8.3 (L) 05/29/2014 0917   PROT 6.5 04/19/2019 1544   PROT 6.5 05/29/2014 0917   ALBUMIN 4.4 08/01/2018 1427   ALBUMIN 3.5 05/29/2014 0917   AST 23 04/19/2019 1544   AST 30 05/29/2014 0917   ALT 21 04/19/2019 1544   ALT 47 05/29/2014 0917   ALKPHOS 41 08/01/2018 1427   ALKPHOS 57 05/29/2014 0917   BILITOT 0.4 04/19/2019 1544   BILITOT 0.6 05/29/2014 0917   GFRNONAA >60 05/29/2014 0917   GFRNONAA >60 12/05/2013 1244   GFRAA >60 05/29/2014 0917   GFRAA >60 12/05/2013 1244    Assessment: 1.  Rectal bleeding: For the past 4-5 days, seems  to be slowing now, with bowel movements, rectal exam by PCP with external hemorrhoid that was irritated but no obvious internal hemorrhoid/mass, Hemoccult positive, CBC normal, no abdominal pain or rectal pain; consider most likely diverticular bleed versus other 2.  Constipation: Continue lactulose which seems to be working  Plan: 1.  Patient tells me that he is very worried that it is a cancer  since he has already been through prostate cancer.  He plans on living at least 10 more years and wants to know. 2.  Scheduled the patient for colonoscopy.  Dr. Hilarie Fredrickson did not have a sooner appointment until mid May.  Patient is very nervous and would like it sooner.  This procedure was scheduled with Dr. Bryan Lemma as he has an opening on Wednesday.  Patient will continue to follow with Dr. Hilarie Fredrickson after time of procedure who is his primary GI physician.  Patient has already had both Covid vaccines the last in February. 3.  Continue Lactulose.  Discussed titrating this to a soft solid bowel movement. 4.  Patient to follow in clinic per recommendations after colonoscopy  Ellouise Newer, PA-C [facility] 10/02/2019, 1:54 PM  Cc: Crecencio Mc, MD

## 2019-10-02 NOTE — Patient Instructions (Addendum)
If you are age 84 or older, your body mass index should be between 23-30. Your Body mass index is 22.88 kg/m. If this is out of the aforementioned range listed, please consider follow up with your Primary Care Provider.  If you are age 24 or younger, your body mass index should be between 19-25. Your Body mass index is 22.88 kg/m. If this is out of the aformentioned range listed, please consider follow up with your Primary Care Provider.   Due to recent changes in healthcare laws, you may see the results of your imaging and laboratory studies on MyChart before your provider has had a chance to review them.  We understand that in some cases there may be results that are confusing or concerning to you. Not all laboratory results come back in the same time frame and the provider may be waiting for multiple results in order to interpret others.  Please give [place] 48 hours in order for your provider to thoroughly review all the results before contacting the office for clarification of your results.   We have sent the following medications to your pharmacy for you to pick up at your convenience:  [email or web address]

## 2019-10-03 ENCOUNTER — Encounter: Payer: Self-pay | Admitting: Gastroenterology

## 2019-10-03 NOTE — Progress Notes (Signed)
Agree with the assessment and plan as outlined by Jennifer Lemmon, PA-C. ? ?Juvencio Verdi, DO, FACG ? ?

## 2019-10-07 NOTE — Progress Notes (Signed)
Addendum: Reviewed and agree with assessment and management plan. Latrecia Capito M, MD  

## 2019-10-09 ENCOUNTER — Other Ambulatory Visit: Payer: Self-pay

## 2019-10-09 ENCOUNTER — Ambulatory Visit (AMBULATORY_SURGERY_CENTER): Payer: PPO | Admitting: Gastroenterology

## 2019-10-09 ENCOUNTER — Encounter: Payer: Self-pay | Admitting: Gastroenterology

## 2019-10-09 VITALS — BP 122/59 | HR 63 | Temp 96.6°F | Resp 15 | Ht 76.0 in | Wt 188.0 lb

## 2019-10-09 DIAGNOSIS — K625 Hemorrhage of anus and rectum: Secondary | ICD-10-CM | POA: Diagnosis not present

## 2019-10-09 DIAGNOSIS — D125 Benign neoplasm of sigmoid colon: Secondary | ICD-10-CM | POA: Diagnosis not present

## 2019-10-09 DIAGNOSIS — K5900 Constipation, unspecified: Secondary | ICD-10-CM

## 2019-10-09 DIAGNOSIS — D122 Benign neoplasm of ascending colon: Secondary | ICD-10-CM

## 2019-10-09 DIAGNOSIS — K5730 Diverticulosis of large intestine without perforation or abscess without bleeding: Secondary | ICD-10-CM | POA: Diagnosis not present

## 2019-10-09 DIAGNOSIS — D120 Benign neoplasm of cecum: Secondary | ICD-10-CM

## 2019-10-09 DIAGNOSIS — K641 Second degree hemorrhoids: Secondary | ICD-10-CM

## 2019-10-09 MED ORDER — SODIUM CHLORIDE 0.9 % IV SOLN: 500.0000 mL | Freq: Once | INTRAVENOUS | Status: DC

## 2019-10-09 NOTE — Progress Notes (Signed)
Called to room to assist during endoscopic procedure.  Patient ID and intended procedure confirmed with present staff. Received instructions for my participation in the procedure from the performing physician.  

## 2019-10-09 NOTE — Progress Notes (Signed)
Pt's states no medical or surgical changes since previsit or office visit.  Cw vitals, SH Iv and LC temp.

## 2019-10-09 NOTE — Op Note (Signed)
[facility] Patient Name: George Owens Procedure Date: 10/09/2019 12:12 PM MRN: [id number] Endoscopist: Gerrit Heck , MD Age: 86 Referring MD:  Date of Birth: [birth year] Gender: Male Account #: [id number] Procedure:                Colonoscopy Indications:              Hematochezia                           History of chronic constipation Medicines:                Monitored Anesthesia Care Procedure:                Pre-Anesthesia Assessment:                           - Prior to the procedure, a History and Physical                            was performed, and patient medications and                            allergies were reviewed. The patient's tolerance of                            previous anesthesia was also reviewed. The risks                            and benefits of the procedure and the sedation                            options and risks were discussed with the patient.                            All questions were answered, and informed consent                            was obtained. Prior Anticoagulants: The patient has                            taken no previous anticoagulant or antiplatelet                            agents. ASA Grade Assessment: III - A patient with                            severe systemic disease. After reviewing the risks                            and benefits, the patient was deemed in                            satisfactory condition to undergo the procedure.  After obtaining informed consent, the colonoscope                            was passed under direct vision. Throughout the                            procedure, the patient's blood pressure, pulse, and                            oxygen saturations were monitored continuously. The                            Colonoscope was introduced through the anus and                            advanced to the the cecum, identified by       appendiceal orifice and ileocecal valve. The                            colonoscopy was performed without difficulty. The                            patient tolerated the procedure well. The quality                            of the bowel preparation was adequate. The                            ileocecal valve, appendiceal orifice, and rectum                            were photographed. Scope In: 12:19:03 PM Scope Out: 12:41:05 PM Scope Withdrawal Time: 0 hours 18 minutes 20 seconds  Total Procedure Duration: 0 hours 22 minutes 2 seconds  Findings:                 Hemorrhoids were found on perianal exam.                           Nine sessile polyps were found in the sigmoid colon                            (3), ascending colon (3), and cecum (3). The polyps                            were 3 to 6 mm in size. These polyps were removed                            with a cold snare. Resection and retrieval were                            complete. Estimated blood loss was minimal.  A few small-mouthed diverticula were found in the                            sigmoid colon.                           Non-bleeding internal hemorrhoids were found during                            retroflexion. The hemorrhoids were medium-sized and                            Grade II (internal hemorrhoids that prolapse but                            reduce spontaneously). Complications:            No immediate complications. Estimated Blood Loss:     Estimated blood loss was minimal. Impression:               - Hemorrhoids found on perianal exam.                           - Nine 3 to 6 mm polyps in the sigmoid colon, in                            the ascending colon and in the cecum, removed with                            a cold snare. Resected and retrieved.                           - Diverticulosis in the sigmoid colon.                           - Non-bleeding internal  hemorrhoids. Recommendation:           - Patient has a contact number available for                            emergencies. The signs and symptoms of potential                            delayed complications were discussed with the                            patient. Return to normal activities tomorrow.                            Written discharge instructions were provided to the                            patient.                           - Resume previous diet.                           -  Continue present medications.                           - Await pathology results.                           - Repeat colonoscopy for surveillance based on                            pathology results.                           - Return to GI clinic PRN.                           - Internal hemorrhoids were noted on this study and                            may be amenable to hemorrhoid band ligation. If you                            are interested in further treatment of these                            hemorrhoids with band ligation, please contact the                            GI clinic to set up an appointment for evaluation                            and treatment with either Dr. Bryan Lemma or Dr.                            Hilarie Fredrickson. Gerrit Heck, MD 10/09/2019 12:45:21 PM

## 2019-10-09 NOTE — Patient Instructions (Signed)
Thank you for allowing [place] to care for you today!  Await pathology results, approximately 7-10 days.  Will consider surveillance colonoscopy in future,  if appropriate.  Will make this recommendation after biopsy results are final.  Can do an -in-office hemorrhoidal banding if hemorrhoids are causing discomfort.  Contact GI clinic to set up an appointment for evaluation and  treatment with  Dr Bryan Lemma or Dr Hilarie Fredrickson.  Resume previous diet and medications today.  Return to your normal activities tomorrow.    YOU HAD AN ENDOSCOPIC PROCEDURE TODAY AT [facility]:   Refer to the procedure report that was given to you for any specific questions about what was found during the examination.  If the procedure report does not answer your questions, please call your gastroenterologist to clarify.  If you requested that your care partner not be given the details of your procedure findings, then the procedure report has been included in a sealed envelope for you to review at your convenience later.  YOU SHOULD EXPECT: Some feelings of bloating in the abdomen. Passage of more gas than usual.  Walking can help get rid of the air that was put into your GI tract during the procedure and reduce the bloating. If you had a lower endoscopy (such as a colonoscopy or flexible sigmoidoscopy) you may notice spotting of blood in your stool or on the toilet paper. If you underwent a bowel prep for your procedure, you may not have a normal bowel movement for a few days.  Please Note:  You might notice some irritation and congestion in your nose or some drainage.  This is from the oxygen used during your procedure.  There is no need for concern and it should clear up in a day or so.  SYMPTOMS TO REPORT IMMEDIATELY:   Following lower endoscopy (colonoscopy or flexible sigmoidoscopy):  Excessive amounts of blood in the stool  Significant tenderness or worsening of abdominal pains  Swelling of the abdomen  that is new, acute  Fever of 100F or higher   For urgent or emergent issues, a gastroenterologist can be reached at any hour by calling [phone]. Do not use MyChart messaging for urgent concerns.    DIET:  We do recommend a small meal at first, but then you may proceed to your regular diet.  Drink plenty of fluids but you should avoid alcoholic beverages for 24 hours.  ACTIVITY:  You should plan to take it easy for the rest of today and you should NOT DRIVE or use heavy machinery until tomorrow (because of the sedation medicines used during the test).    FOLLOW UP: Our staff will call the number listed on your records 48-72 hours following your procedure to check on you and address any questions or concerns that you may have regarding the information given to you following your procedure. If we do not reach you, we will leave a message.  We will attempt to reach you two times.  During this call, we will ask if you have developed any symptoms of COVID 19. If you develop any symptoms (ie: fever, flu-like symptoms, shortness of breath, cough etc.) before then, please call [phone].  If you test positive for Covid 19 in the 2 weeks post procedure, please call and report this information to [place].    If any biopsies were taken you will be contacted by phone or by letter within the next 1-3 weeks.  Please call us at [phone] if you  have not heard about the biopsies in 3 weeks.    SIGNATURES/CONFIDENTIALITY: You and/or your care partner have signed paperwork which will be entered into your electronic medical record.  These signatures attest to the fact that that the information above on your After Visit Summary has been reviewed and is understood.  Full responsibility of the confidentiality of this discharge information lies with you and/or your care-partner.

## 2019-10-09 NOTE — Progress Notes (Signed)
A/ox3, pleased with MAC, report to RN 

## 2019-10-10 ENCOUNTER — Telehealth: Payer: Self-pay | Admitting: Gastroenterology

## 2019-10-10 NOTE — Telephone Encounter (Signed)
error 

## 2019-10-11 ENCOUNTER — Telehealth: Payer: Self-pay

## 2019-10-11 NOTE — Telephone Encounter (Signed)
  Follow up Call-  Call back number 10/09/2019 11/29/2018  Post procedure Call Back phone  # [phone]  Permission to leave phone message Yes Yes  Some recent data might be hidden     Patient questions:  Do you have a fever, pain , or abdominal swelling? No. Pain Score  0 *  Have you tolerated food without any problems? Yes.    Have you been able to return to your normal activities? Yes.    Do you have any questions about your discharge instructions: Diet   No. Medications  No. Follow up visit  No.  Do you have questions or concerns about your Care? No.  Actions: * If pain score is 4 or above: No action needed, pain <4. 1. Have you developed a fever since your procedure? no  2.   Have you had an respiratory symptoms (SOB or cough) since your procedure? no  3.   Have you tested positive for COVID 19 since your procedure no  4.   Have you had any family members/close contacts diagnosed with the COVID 19 since your procedure?  no   If yes to any of these questions please route to Joylene Dail, RN and Erenest Rasher, RN

## 2019-10-20 ENCOUNTER — Encounter: Payer: Self-pay | Admitting: Gastroenterology

## 2019-10-21 DIAGNOSIS — D2262 Melanocytic nevi of left upper limb, including shoulder: Secondary | ICD-10-CM | POA: Diagnosis not present

## 2019-10-21 DIAGNOSIS — L570 Actinic keratosis: Secondary | ICD-10-CM | POA: Diagnosis not present

## 2019-10-21 DIAGNOSIS — L821 Other seborrheic keratosis: Secondary | ICD-10-CM | POA: Diagnosis not present

## 2019-10-21 DIAGNOSIS — D225 Melanocytic nevi of trunk: Secondary | ICD-10-CM | POA: Diagnosis not present

## 2019-10-21 DIAGNOSIS — D2261 Melanocytic nevi of right upper limb, including shoulder: Secondary | ICD-10-CM | POA: Diagnosis not present

## 2019-10-21 DIAGNOSIS — Z85828 Personal history of other malignant neoplasm of skin: Secondary | ICD-10-CM | POA: Diagnosis not present

## 2019-10-21 DIAGNOSIS — D485 Neoplasm of uncertain behavior of skin: Secondary | ICD-10-CM | POA: Diagnosis not present

## 2019-10-21 DIAGNOSIS — B356 Tinea cruris: Secondary | ICD-10-CM | POA: Diagnosis not present

## 2019-10-21 DIAGNOSIS — L820 Inflamed seborrheic keratosis: Secondary | ICD-10-CM | POA: Diagnosis not present

## 2019-10-22 ENCOUNTER — Other Ambulatory Visit: Payer: Self-pay

## 2019-10-22 ENCOUNTER — Ambulatory Visit (INDEPENDENT_AMBULATORY_CARE_PROVIDER_SITE_OTHER): Payer: PPO

## 2019-10-22 DIAGNOSIS — M818 Other osteoporosis without current pathological fracture: Secondary | ICD-10-CM | POA: Diagnosis not present

## 2019-10-22 MED ORDER — DENOSUMAB 60 MG/ML ~~LOC~~ SOSY
60.0000 mg | PREFILLED_SYRINGE | Freq: Once | SUBCUTANEOUS | Status: AC
  Administered 2019-10-22: 60 mg via SUBCUTANEOUS

## 2019-10-22 NOTE — Progress Notes (Addendum)
Pt presented today for Prolia injection. Right arm, SQ. Pt tolerated injection well.   Reviewed Dr Nicki Reaper

## 2019-10-23 ENCOUNTER — Ambulatory Visit: Payer: PPO | Admitting: Internal Medicine

## 2019-11-06 ENCOUNTER — Ambulatory Visit: Payer: PPO | Admitting: Gastroenterology

## 2019-11-28 ENCOUNTER — Ambulatory Visit: Payer: PPO | Admitting: Physician Assistant

## 2019-11-28 ENCOUNTER — Encounter: Payer: Self-pay | Admitting: Physician Assistant

## 2019-11-28 VITALS — BP 110/60 | HR 89 | Ht 76.0 in | Wt 188.0 lb

## 2019-11-28 DIAGNOSIS — K5909 Other constipation: Secondary | ICD-10-CM

## 2019-11-28 DIAGNOSIS — K641 Second degree hemorrhoids: Secondary | ICD-10-CM

## 2019-11-28 NOTE — Progress Notes (Signed)
Chief Complaint: Chronic constipation  HPI:    George Owens is an 86 year old male with a past medical history as listed below, known to Dr. Hilarie Fredrickson, who presents to clinic today to discuss his chronic constipation.      10/02/2019 patient seen in the clinic by me for rectal bleeding.  At that time he described that he had seen a small amount of blood and none in his stool, which seemed to be slowing down.  He was very concerned in regards to colon cancer and for that reason had a colonoscopy scheduled with Dr. Bryan Lemma as he had an opening.  He was continued on lactulose as this seemed to be working for his constipation.    10/09/2019 colonoscopy with hemorrhoids, 9 3-6 mm polyps in the sigmoid, ascending colon and cecum, diverticulosis in the sigmoid colon and otherwise normal.  Pathology showed tubular adenomatous and inflammatory polyps.    Today, the patient presents to clinic with his wife and tells me that he just has some questions after his colonoscopy and receiving results through [facility].  Also tells me that currently he is using lactulose one 30 mL dose once a day and is achieving a daily bowel movement though this is "skinny in size".  Patient does tell me that he has to push to get the bowel movement started, but once it is coming out, it comes out all by itself and he feels empty afterwards.  Describes that he has been on various laxatives prior to this including Amitiza and MiraLAX for a time but this one works the best for him.  As far as his hemorrhoids he is having no symptoms at the moment.    He and his wife are getting ready to go on a trip up to [place] and then [place], [place] in [place], they leave this weekend.    Denies fever, chills, rectal bleeding, abdominal pain or symptoms that awaken him from sleep.  Past Medical History:  Diagnosis Date  . Allergy   . Basal cell carcinoma    right temple Dr. Evorn Gong 12/2018  . Chicken pox   . Colon polyps     . GERD (gastroesophageal reflux disease)   . history of prostate CA 2006  . Hyperlipidemia   . Leg cramps   . Neuropathy   . Osteoporosis     Past Surgical History:  Procedure Laterality Date  . CATARACT EXTRACTION Right   . CATARACT EXTRACTION W/PHACO Left 12/26/2017   Procedure: CATARACT EXTRACTION PHACO AND INTRAOCULAR LENS PLACEMENT ([facility])  COMPLICATED LEFT;  Surgeon: Leandrew Koyanagi, MD;  Location: [facility];  Service: Ophthalmology;  Laterality: Left;  [place]  . EYE MUSCLE SURGERY Left   . TONSILLECTOMY AND ADENOIDECTOMY      Current Outpatient Medications  Medication Sig Dispense Refill  . Calcium Carb-Cholecalciferol (CALCIUM 1000 + D PO) Take 1 tablet by mouth daily.     . Cholecalciferol (VITAMIN D3) 400 UNITS CAPS Take 2 capsules by mouth daily.     . Denosumab (PROLIA [place]) Inject into the skin every 6 (six) months.    [name] glucosamine-chondroitin 500-400 MG tablet Take 1 tablet by mouth daily.    [name] lactulose (CHRONULAC) 10 GM/15ML solution TAKE 30 MLS (20 G TOTAL) BY MOUTH 2 (TWO) TIMES DAILY. 1892 mL 3  . loratadine (CLARITIN) 10 MG tablet Take 10 mg by mouth daily as needed.     . Methylcellulose, Laxative, 500 MG TABS Take by mouth as  directed. Takes 2 daily    . Multiple Vitamins-Minerals (PRESERVISION AREDS PO) Take 2 capsules by mouth daily.    . naproxen (NAPROSYN) 250 MG tablet Take 250 mg by mouth as needed.    [name] omeprazole (PRILOSEC) 40 MG capsule TAKE 1 CAPSULE (40 MG TOTAL) BY MOUTH DAILY. IN THE MORNING 30 MINUTES PRIOR TO EATING 90 capsule 3  . ondansetron (ZOFRAN ODT) 4 MG disintegrating tablet Take 1 tablet (4 mg total) by mouth every 8 (eight) hours as needed for nausea or vomiting. (Patient not taking: Reported on 10/09/2019) 20 tablet 0  . Ranibizumab (LUCENTIS IO) Inject into the eye as needed. As needed every three months.    . vitamin B-12 (CYANOCOBALAMIN) 1000 MCG tablet Take 1,000 mcg by mouth daily.     No current  facility-administered medications for this visit.    Allergies as of 11/28/2019 - Review Complete 10/09/2019  Allergen Reaction Noted  . Kenalog [triamcinolone acetonide]  12/13/2012    Family History  Problem Relation Age of Onset  . Stroke Mother   . Arthritis Father   . Heart disease Father   . Brain cancer Brother        1/2 brother (possibly related to mother NOT related to patient)  . Colon cancer Neg Hx   . Esophageal cancer Neg Hx   . Pancreatic cancer Neg Hx   . Stomach cancer Neg Hx   . Liver disease Neg Hx     Social History   Socioeconomic History  . Marital status: Married    Spouse name: Not on file  . Number of children: 4  . Years of education: Not on file  . Highest education level: Not on file  Occupational History  . Occupation: retired  Tobacco Use  . Smoking status: Never Smoker  . Smokeless tobacco: Never Used  Vaping Use  . Vaping Use: Never used  Substance and Sexual Activity  . Alcohol use: No  . Drug use: No  . Sexual activity: Yes    Partners: Female  Other Topics Concern  . Not on file  Social History Narrative   Lives with wife in a 5 story retirement home.  He lives on the first floor.  4 year college degree.  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     Social Determinants of Health   Financial Resource Strain: Low Risk   . Difficulty of Paying Living Expenses: Not hard at all  Food Insecurity: No Food Insecurity  . Worried About Charity fundraiser in the Last Year: Never true  . Ran Out of Food in the Last Year: Never true  Transportation Needs: No Transportation Needs  . Lack of Transportation (Medical): No  . Lack of Transportation (Non-Medical): No  Physical Activity: Sufficiently Active  . Days of Exercise per Week: 5 days  . Minutes of Exercise per Session: 30 min  Stress: No Stress Concern Present  . Feeling of Stress : Not at all  Social Connections:   . Frequency of Communication with  Friends and Family:   . Frequency of Social Gatherings with Friends and Family:   . Attends Religious Services:   . Active Member of Clubs or Organizations:   . Attends Archivist Meetings:   [name] Marital Status:   Intimate Partner Violence: Not At Risk  . Fear of Current or Ex-Partner: No  . Emotionally Abused: No  . Physically Abused: No  . Sexually Abused: No  Review of Systems:    Constitutional: No weight loss, fever or chills Cardiovascular: No chest pain Respiratory: No SOB Gastrointestinal: See HPI and otherwise negative   Physical Exam:  Vital signs: BP 110/60   Pulse 89   Ht 6\' 4"  (1.93 m)   Wt 188 lb (85.3 kg)   BMI 22.88 kg/m   Constitutional:   Pleasant Elderly Caucasian male appears to be in NAD, Well developed, Well nourished, alert and cooperative Respiratory: Respirations even and unlabored. Lungs clear to auscultation bilaterally.   No wheezes, crackles, or rhonchi.  Cardiovascular: Normal S1, S2. No MRG. Regular rate and rhythm. No peripheral edema, cyanosis or pallor.  Gastrointestinal:  Soft, nondistended, nontender. No rebound or guarding. Normal bowel sounds. No appreciable masses or hepatomegaly. Rectal:  Not performed.  Psychiatric: Demonstrates good judgement and reason without abnormal affect or behaviors.  No recent labs or imaging.  Assessment: 1.  Chronic constipation: Controlled with Lactulose 2.  Hemorrhoids: No symptoms at the moment  Plan: 1.  Printed out the patient's colonoscopy and letter that was sent to his house.  Went through this in detail and answered all of his questions.  Provided him with another copy. 2.  Discussed constipation with the patient.  I would recommend that we continue him on his current regimen as this seems to be working for the most part.  Discussed that a slight push to get a bowel movement started is not abnormal as long as he is not straining for the entire bowel movement and that he feels empty  afterwards.  He could try titrating his Lactulose up to a dose and a quarter or a dose and a half a day to see if he achieves what he would like. 3.  Discussed that since he is having no problems from hemorrhoids would not recommend banding at this time. 4.  Patient to follow in clinic as needed with [place] in the future.  Ellouise Newer, PA-C [facility] 11/28/2019, 9:35 AM  Cc: Crecencio Mc, MD

## 2019-11-28 NOTE — Progress Notes (Signed)
Addendum: Reviewed and agree with assessment and management plan. Zettie Gootee M, MD  

## 2019-11-28 NOTE — Patient Instructions (Addendum)
If you are age 84 or older, your body mass index should be between 23-30. Your Body mass index is 22.88 kg/m. If this is out of the aforementioned range listed, please consider follow up with your Primary Care Provider.  If you are age 54 or younger, your body mass index should be between 19-25. Your Body mass index is 22.88 kg/m. If this is out of the aformentioned range listed, please consider follow up with your Primary Care Provider.   Please follow up with our office as needed.    I appreciate the opportunity to care for you. Ellouise Newer, PA-C

## 2020-01-01 DIAGNOSIS — H353223 Exudative age-related macular degeneration, left eye, with inactive scar: Secondary | ICD-10-CM | POA: Diagnosis not present

## 2020-01-01 DIAGNOSIS — H31093 Other chorioretinal scars, bilateral: Secondary | ICD-10-CM | POA: Diagnosis not present

## 2020-01-01 DIAGNOSIS — H35433 Paving stone degeneration of retina, bilateral: Secondary | ICD-10-CM | POA: Diagnosis not present

## 2020-01-01 DIAGNOSIS — H353211 Exudative age-related macular degeneration, right eye, with active choroidal neovascularization: Secondary | ICD-10-CM | POA: Diagnosis not present

## 2020-01-22 ENCOUNTER — Other Ambulatory Visit: Payer: Self-pay | Admitting: Internal Medicine

## 2020-02-28 DIAGNOSIS — Z20828 Contact with and (suspected) exposure to other viral communicable diseases: Secondary | ICD-10-CM | POA: Diagnosis not present

## 2020-03-11 ENCOUNTER — Other Ambulatory Visit: Payer: PPO

## 2020-03-11 DIAGNOSIS — Z20822 Contact with and (suspected) exposure to covid-19: Secondary | ICD-10-CM

## 2020-03-13 ENCOUNTER — Telehealth: Payer: Self-pay | Admitting: Internal Medicine

## 2020-03-13 LAB — SARS-COV-2, NAA 2 DAY TAT

## 2020-03-13 LAB — NOVEL CORONAVIRUS, NAA: SARS-CoV-2, NAA: DETECTED — AB

## 2020-03-13 NOTE — Telephone Encounter (Signed)
Pt is covid is positive and wants to do the antibodies infusion. He would like for this to be sent as soon as possible. His wife is covid positive as well. Pt just received the results.

## 2020-03-13 NOTE — Telephone Encounter (Signed)
I called his name into the [facility]. I will let him know.

## 2020-03-14 ENCOUNTER — Other Ambulatory Visit (HOSPITAL_COMMUNITY): Payer: Self-pay | Admitting: Nurse Practitioner

## 2020-03-14 ENCOUNTER — Ambulatory Visit (HOSPITAL_COMMUNITY)
Admission: RE | Admit: 2020-03-14 | Discharge: 2020-03-14 | Disposition: A | Discharge status: Home / Self Care | Payer: Medicare Other | Source: Ambulatory Visit | Attending: Pulmonary Disease | Admitting: Pulmonary Disease

## 2020-03-14 DIAGNOSIS — Z8546 Personal history of malignant neoplasm of prostate: Secondary | ICD-10-CM

## 2020-03-14 DIAGNOSIS — Z23 Encounter for immunization: Secondary | ICD-10-CM | POA: Diagnosis not present

## 2020-03-14 DIAGNOSIS — U071 COVID-19: Secondary | ICD-10-CM

## 2020-03-14 MED ORDER — ALBUTEROL SULFATE HFA 108 (90 BASE) MCG/ACT IN AERS: 2.0000 | INHALATION_SPRAY | Freq: Once | RESPIRATORY_TRACT | Status: DC | PRN

## 2020-03-14 MED ORDER — DIPHENHYDRAMINE HCL 50 MG/ML IJ SOLN: 50.0000 mg | Freq: Once | INTRAMUSCULAR | Status: DC | PRN

## 2020-03-14 MED ORDER — FAMOTIDINE IN NACL 20-0.9 MG/50ML-% IV SOLN: 20.0000 mg | Freq: Once | INTRAVENOUS | Status: DC | PRN

## 2020-03-14 MED ORDER — METHYLPREDNISOLONE SODIUM SUCC 125 MG IJ SOLR: 125.0000 mg | Freq: Once | INTRAMUSCULAR | Status: DC | PRN

## 2020-03-14 MED ORDER — SODIUM CHLORIDE 0.9 % IV SOLN: INTRAVENOUS | Status: DC | PRN

## 2020-03-14 MED ORDER — SODIUM CHLORIDE 0.9 % IV SOLN
1200.0000 mg | Freq: Once | INTRAVENOUS | Status: AC
  Administered 2020-03-14: 1200 mg via INTRAVENOUS

## 2020-03-14 MED ORDER — EPINEPHRINE 0.3 MG/0.3ML IJ SOAJ: 0.3000 mg | Freq: Once | INTRAMUSCULAR | Status: DC | PRN

## 2020-03-14 NOTE — Discharge Instructions (Signed)

## 2020-03-14 NOTE — Progress Notes (Signed)
  Diagnosis: COVID-19  Physician: Asencion Noble, MD  Procedure: Covid Infusion Clinic Med: casirivimab\imdevimab infusion - Provided patient with casirivimab\imdevimab fact sheet for patients, parents and caregivers prior to infusion.  Complications: No immediate complications noted.  Discharge: Discharged home   George Owens 03/14/2020

## 2020-03-14 NOTE — Progress Notes (Signed)
I connected by phone with George Owens on 03/14/2020 at 12:12 PM to discuss the potential use of a new treatment for mild to moderate COVID-19 viral infection in non-hospitalized patients.  This patient is a 86 y.o. male that meets the FDA criteria for Emergency Use Authorization of COVID monoclonal antibody casirivimab/imdevimab or bamlanivimab/eteseviamb.  Has a (+) direct SARS-CoV-2 viral test result  Has mild or moderate COVID-19   Is NOT hospitalized due to COVID-19  Is within 10 days of symptom onset  Has at least one of the high risk factor(s) for progression to severe COVID-19 and/or hospitalization as defined in EUA.  Specific high risk criteria : Older age (>/= 84 yo), Immunosuppressive Disease or Treatment and Neurodevelopmental disorder   I have spoken and communicated the following to the patient or parent/caregiver regarding COVID monoclonal antibody treatment:  1. FDA has authorized the emergency use for the treatment of mild to moderate COVID-19 in adults and pediatric patients with positive results of direct SARS-CoV-2 viral testing who are 44 years of age and older weighing at least 40 kg, and who are at high risk for progressing to severe COVID-19 and/or hospitalization.  2. The significant known and potential risks and benefits of COVID monoclonal antibody, and the extent to which such potential risks and benefits are unknown.  3. Information on available alternative treatments and the risks and benefits of those alternatives, including clinical trials.  4. Patients treated with COVID monoclonal antibody should continue to self-isolate and use infection control measures (e.g., wear mask, isolate, social distance, avoid sharing personal items, clean and disinfect "high touch" surfaces, and frequent handwashing) according to CDC guidelines.   5. The patient or parent/caregiver has the option to accept or refuse COVID monoclonal antibody treatment.  After reviewing  this information with the patient, the patient has agreed to receive one of the available covid 19 monoclonal antibodies and will be provided an appropriate fact sheet prior to infusion. Jobe Gibbon, NP 03/14/2020 12:12 PM

## 2020-03-16 ENCOUNTER — Other Ambulatory Visit: Payer: Self-pay

## 2020-03-16 ENCOUNTER — Encounter: Payer: Self-pay | Admitting: Nurse Practitioner

## 2020-03-16 ENCOUNTER — Telehealth (INDEPENDENT_AMBULATORY_CARE_PROVIDER_SITE_OTHER): Payer: PPO | Admitting: Nurse Practitioner

## 2020-03-16 VITALS — HR 79 | Temp 96.3°F | Ht 76.0 in | Wt 190.0 lb

## 2020-03-16 DIAGNOSIS — U071 COVID-19: Secondary | ICD-10-CM | POA: Diagnosis not present

## 2020-03-16 HISTORY — DX: COVID-19: U07.1

## 2020-03-16 NOTE — Patient Instructions (Signed)
Please watch for increased shortness of breath, weakness, and signs of dehydration. Seek emergency care if you develop these symptoms, or any chest pain, or if  pulse oximeter drop to 90% or below.   Call us for another video visit  if you start coughing more, feel worse, get more symptoms , or pulse oximeter starts to fall below 95%.    Please rest and hydrate well.  Advised to leave the house during recommended isolation period, only if it is necessary to seek medical care.   Pt advised to remain in self quarantine until at least 10 days since symptom onset And 3 consecutive days fever free without antipyretics And improvement in respiratory symptoms. Patient advised to utilize over the counter medications to treat symptoms.

## 2020-03-16 NOTE — Addendum Note (Signed)
Addended by: Denice Paradise A on: 03/16/2020 11:52 AM   Modules accepted: Level of Service

## 2020-03-16 NOTE — Progress Notes (Addendum)
Virtual Visit via Virtual  Note  This visit type was conducted due to national recommendations for restrictions regarding the COVID-19 pandemic (e.g. social distancing).  This format is felt to be most appropriate for this patient at this time.  All issues noted in this document were discussed and addressed.  No physical exam was performed (except for noted visual exam findings with Video Visits).   I connected with@ on 03/16/20 at 11:00 AM EDT by a video enabled telemedicine application or telephone and verified that I am speaking with the correct person using two identifiers. Location patient: home Location provider: work or home office Persons participating in the virtual visit: patient, provider  I discussed the limitations, risks, security and privacy concerns of performing an evaluation and management service by telephone and the availability of in person appointments. I also discussed with the patient that there may be a patient responsible charge related to this service. The patient expressed understanding and agreed to proceed.   Reason for visit: Covid infection:  HPI: This 86 year old patient was diagnosed with positive Covid on 03/11/2020 for with mild symptoms.  He had the MAB infusion on 03/14/2020.  His symptoms included a sore throat, fatigue, runny nose, and very rare cough.  He had a few chills on Saturday night -after the infusion, but no fever.  He feels well.  He says he would not even know he that he had Covid if it wasn't for the test. He has not felt bad. He is taking Robitussin and occasional cough drops.  He stopped taking his Claritin today but he thinks he will start back for slight runny nose.  He is does not have any chest pain, pressure, heaviness, tightness, shortness of breath, DOE, wheezing.  He has pulse oximetry at home and is measuring 98%.  Temperature 96.3.  He feels well.  His wife is positive as well and they are both quarantining at home.  He had Covid Pfizer  vaccine 06/25/2019 and 07/24/2019.   ROS: See pertinent positives and negatives per HPI.  Past Medical History:  Diagnosis Date  . Allergy   . Basal cell carcinoma    right temple Dr. Evorn Gong 12/2018  . Chicken pox   . Colon polyps   . GERD (gastroesophageal reflux disease)   . history of prostate CA 2006  . Hyperlipidemia   . Leg cramps   . Neuropathy   . Osteoporosis     Past Surgical History:  Procedure Laterality Date  . CATARACT EXTRACTION Right   . CATARACT EXTRACTION W/PHACO Left 12/26/2017   Procedure: CATARACT EXTRACTION PHACO AND INTRAOCULAR LENS PLACEMENT ([place])  COMPLICATED LEFT;  Surgeon: Leandrew Koyanagi, MD;  Location: [facility];  Service: Ophthalmology;  Laterality: Left;  [place]  . colonoscopy    . EYE MUSCLE SURGERY Left   . TONSILLECTOMY AND ADENOIDECTOMY      Family History  Problem Relation Age of Onset  . Stroke Mother   . Arthritis Father   . Heart disease Father   . Brain cancer Brother        1/2 brother (possibly related to mother NOT related to patient)  . Colon cancer Neg Hx   . Esophageal cancer Neg Hx   . Pancreatic cancer Neg Hx   . Stomach cancer Neg Hx   . Liver disease Neg Hx     SOCIAL HX: Never smoked   Current Outpatient Medications:  .  Calcium Carb-Cholecalciferol (CALCIUM 1000 + D PO), Take 1 tablet  by mouth daily. , Disp: , Rfl:  .  Cholecalciferol (VITAMIN D3) 400 UNITS CAPS, Take 2 capsules by mouth daily. , Disp: , Rfl:  .  Denosumab (PROLIA [place]), Inject into the skin every 6 (six) months., Disp: , Rfl:  .  glucosamine-chondroitin 500-400 MG tablet, Take 1 tablet by mouth daily., Disp: , Rfl:  .  lactulose (CHRONULAC) 10 GM/15ML solution, TAKE 30 MLS (20 G TOTAL) BY MOUTH 2 (TWO) TIMES DAILY., Disp: 1892 mL, Rfl: 3 .  loratadine (CLARITIN) 10 MG tablet, Take 10 mg by mouth daily as needed. , Disp: , Rfl:  .  Multiple Vitamins-Minerals (PRESERVISION AREDS PO), Take 2 capsules by mouth daily., Disp: ,  Rfl:  .  naproxen (NAPROSYN) 250 MG tablet, Take 250 mg by mouth as needed., Disp: , Rfl:  .  omeprazole (PRILOSEC) 40 MG capsule, TAKE 1 CAPSULE (40 MG TOTAL) BY MOUTH DAILY. IN THE MORNING 30 MINUTES PRIOR TO EATING, Disp: 90 capsule, Rfl: 3 .  Ranibizumab (LUCENTIS IO), Inject into the eye as needed. As needed every three months., Disp: , Rfl:  .  vitamin B-12 (CYANOCOBALAMIN) 1000 MCG tablet, Take 1,000 mcg by mouth daily., Disp: , Rfl:   EXAM:  VITALS per patient if applicable: 37.4, pulse 79, O2 sat 98%, weight 190 pounds  GENERAL: alert, oriented, appears well and in no acute distress.  He does not appear ill or fatigue.  HEENT: atraumatic, conjunctiva clear, no obvious abnormalities on inspection of external nose and ears  NECK: normal movements of the head and neck  LUNGS: on inspection no signs of respiratory distress, breathing rate appears normal, no obvious gross SOB, gasping or wheezing.  No cough noted during conversation.  CV: no obvious cyanosis  MS: moves all visible extremities without noticeable abnormality  PSYCH/NEURO: pleasant and cooperative, no obvious depression or anxiety, speech and thought processing grossly intact  ASSESSMENT AND PLAN:  Discussed the following assessment and plan:  COVID-19 virus infection  No problem-specific Assessment & Plan notes found for this encounter.   He appears to have a mild case of Covid.  He has had full vaccination with [place] completed in February.  He tested positive for Covid on 9/22 and had the MAB infusion 9/25.  Chart notes reviewed. Symptoms include a sore throat that has resolved, fatigue that has resolved, runny nose that persists and is slight, and very rare cough.  Once in a while he will cough up a little phlegm.  He does not believe the infection is in his chest.  He is taking the Robitussin as needed and a Claritin.  At this point in time he needs rest, hydration, and anticipate  that his mild case will  continue. Patient advised to monitor for worsening symptoms, check pulse oximetry, and promptly report if he develops worsening symptoms, or O2 sats fall.   I discussed the assessment and treatment plan with the patient. The patient was provided an opportunity to ask questions and all were answered. The patient agreed with the plan and demonstrated an understanding of the instructions.   The patient was advised to call back or seek an in-person evaluation if the symptoms worsen or if the condition fails to improve as anticipated.  Denice Paradise, NP Adult Nurse Practitioner [place] [phone]

## 2020-03-18 ENCOUNTER — Telehealth: Payer: Self-pay | Admitting: Internal Medicine

## 2020-03-18 MED ORDER — CHERATUSSIN AC 100-10 MG/5ML PO SOLN: 5.0000 mL | Freq: Three times a day (TID) | ORAL | 0 refills | Status: DC | PRN

## 2020-03-18 NOTE — Addendum Note (Signed)
Addended by: Crecencio Mc on: 03/18/2020 05:18 PM   Modules accepted: Orders

## 2020-03-18 NOTE — Telephone Encounter (Signed)
Sending cheratussin with codeine to pharmacy.  Can use Robitussin or Delsym for daytime cough if too strong for daytime

## 2020-03-18 NOTE — Telephone Encounter (Signed)
Pt saw Maudie Mercury on 9/27 and he wanted to let Dr.Tullo know that he has now developed a cough and wanted to know if he needed to change any of the medication that he is taking now positive covid on 9/22

## 2020-03-18 NOTE — Telephone Encounter (Signed)
Spoke with pt and let him know what medication dr. Derrel Nip has sent in for him to take for his cough.

## 2020-03-21 ENCOUNTER — Telehealth (INDEPENDENT_AMBULATORY_CARE_PROVIDER_SITE_OTHER): Payer: PPO | Admitting: Family Medicine

## 2020-03-21 DIAGNOSIS — U071 COVID-19: Secondary | ICD-10-CM

## 2020-03-21 DIAGNOSIS — K5902 Outlet dysfunction constipation: Secondary | ICD-10-CM | POA: Diagnosis not present

## 2020-03-21 MED ORDER — ALBUTEROL SULFATE HFA 108 (90 BASE) MCG/ACT IN AERS: 2.0000 | INHALATION_SPRAY | Freq: Four times a day (QID) | RESPIRATORY_TRACT | 0 refills | Status: DC | PRN

## 2020-03-21 MED ORDER — CEFDINIR 300 MG PO CAPS: 300.0000 mg | ORAL_CAPSULE | Freq: Two times a day (BID) | ORAL | 0 refills | Status: AC

## 2020-03-21 MED ORDER — CHERATUSSIN AC 100-10 MG/5ML PO SOLN
5.0000 mL | Freq: Three times a day (TID) | ORAL | 0 refills | Status: DC | PRN
Start: 2020-03-21 — End: 2020-03-26

## 2020-03-22 NOTE — Assessment & Plan Note (Signed)
Continues to monitor with Lactulose, had dropped to a 1/2 dose as it was working too well but with current illness has had to increase back to a full dose with decent results.

## 2020-03-22 NOTE — Progress Notes (Signed)
Virtual Visit via Video Note  I connected with George Owens on 03/21/20 at 12:40 PM EDT by a video enabled telemedicine application and verified that I am speaking with the correct person using two identifiers.  Location: Patient: home, patient, wife and provider in visit Provider: office   I discussed the limitations of evaluation and management by telemedicine and the availability of in person appointments. The patient expressed understanding and agreed to proceed. Nani Skillern, CMA was able to get the patient set up on a video visit    Subjective:    Patient ID: George Owens, male    DOB: 01-05-1932, 86 y.o.   MRN: [id number]  Chief Complaint  Patient presents with  . Covid Exposure    positive 9/21 . chest tightness , 02 95-96    HPI Patient is in today for cough. Patient first had COVID symptoms on 9/21, tested positive on 9/24 and underwent MAB infusion. He continues to struggle with a cough he now feels is in his chest more now. He notes fatigue, chills, anorexia and congestion are worsening. No fevers or loss of smell or taste. Concern for secondary bacterial infection. Denies CP/palp/fevers/GI or GU c/o. Taking meds as prescribed.   Past Medical History:  Diagnosis Date  . Allergy   . Basal cell carcinoma    right temple Dr. Evorn Gong 12/2018  . Chicken pox   . Colon polyps   . GERD (gastroesophageal reflux disease)   . history of prostate CA 2006  . Hyperlipidemia   . Leg cramps   . Neuropathy   . Osteoporosis     Past Surgical History:  Procedure Laterality Date  . CATARACT EXTRACTION Right   . CATARACT EXTRACTION W/PHACO Left 12/26/2017   Procedure: CATARACT EXTRACTION PHACO AND INTRAOCULAR LENS PLACEMENT ([place])  COMPLICATED LEFT;  Surgeon: Leandrew Koyanagi, MD;  Location: [facility];  Service: Ophthalmology;  Laterality: Left;  [place]  . colonoscopy    . EYE MUSCLE SURGERY Left   . TONSILLECTOMY AND ADENOIDECTOMY      Family History   Problem Relation Age of Onset  . Stroke Mother   . Arthritis Father   . Heart disease Father   . Brain cancer Brother        1/2 brother (possibly related to mother NOT related to patient)  . Colon cancer Neg Hx   . Esophageal cancer Neg Hx   . Pancreatic cancer Neg Hx   . Stomach cancer Neg Hx   . Liver disease Neg Hx     Social History   Socioeconomic History  . Marital status: Married    Spouse name: Eloise  . Number of children: 4  . Years of education: Not on file  . Highest education level: Not on file  Occupational History  . Occupation: retired  Tobacco Use  . Smoking status: Never Smoker  . Smokeless tobacco: Never Used  Vaping Use  . Vaping Use: Never used  Substance and Sexual Activity  . Alcohol use: No  . Drug use: No  . Sexual activity: Yes    Partners: Female  Other Topics Concern  . Not on file  Social History Narrative   Lives with wife in a 5 story retirement home.  He lives on the first floor.  4 year college degree.  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     Social Determinants of Health   Financial Resource Strain: Low Risk   .  Difficulty of Paying Living Expenses: Not hard at all  Food Insecurity: No Food Insecurity  . Worried About Charity fundraiser in the Last Year: Never true  . Ran Out of Food in the Last Year: Never true  Transportation Needs: No Transportation Needs  . Lack of Transportation (Medical): No  . Lack of Transportation (Non-Medical): No  Physical Activity: Sufficiently Active  . Days of Exercise per Week: 5 days  . Minutes of Exercise per Session: 30 min  Stress: No Stress Concern Present  . Feeling of Stress : Not at all  Social Connections:   . Frequency of Communication with Friends and Family: Not on file  . Frequency of Social Gatherings with Friends and Family: Not on file  . Attends Religious Services: Not on file  . Active Member of Clubs or Organizations: Not on file  . Attends  Archivist Meetings: Not on file  . Marital Status: Not on file  Intimate Partner Violence: Not At Risk  . Fear of Current or Ex-Partner: No  . Emotionally Abused: No  . Physically Abused: No  . Sexually Abused: No    Outpatient Medications Prior to Visit  Medication Sig Dispense Refill  . Calcium Carb-Cholecalciferol (CALCIUM 1000 + D PO) Take 1 tablet by mouth daily.     . Cholecalciferol (VITAMIN D3) 400 UNITS CAPS Take 2 capsules by mouth daily.     . Denosumab (PROLIA [place]) Inject into the skin every 6 (six) months.    [name] glucosamine-chondroitin 500-400 MG tablet Take 1 tablet by mouth daily.    [name] lactulose (CHRONULAC) 10 GM/15ML solution TAKE 30 MLS (20 G TOTAL) BY MOUTH 2 (TWO) TIMES DAILY. 1892 mL 3  . loratadine (CLARITIN) 10 MG tablet Take 10 mg by mouth daily as needed.     . Multiple Vitamins-Minerals (PRESERVISION AREDS PO) Take 2 capsules by mouth daily.    . naproxen (NAPROSYN) 250 MG tablet Take 250 mg by mouth as needed.    [name] omeprazole (PRILOSEC) 40 MG capsule TAKE 1 CAPSULE (40 MG TOTAL) BY MOUTH DAILY. IN THE MORNING 30 MINUTES PRIOR TO EATING 90 capsule 3  . Ranibizumab (LUCENTIS IO) Inject into the eye as needed. As needed every three months.    . vitamin B-12 (CYANOCOBALAMIN) 1000 MCG tablet Take 1,000 mcg by mouth daily.    [name] guaiFENesin-codeine (CHERATUSSIN AC) 100-10 MG/5ML syrup Take 5 mLs by mouth 3 (three) times daily as needed for cough. 120 mL 0   No facility-administered medications prior to visit.    Allergies  Allergen Reactions  . Kenalog [Triamcinolone Acetonide]     Blindness X 3 Days    Review of Systems  Constitutional: Positive for chills and malaise/fatigue. Negative for fever.  HENT: Positive for congestion.   Eyes: Negative for blurred vision.  Respiratory: Positive for cough, sputum production and shortness of breath.   Cardiovascular: Negative for chest pain, palpitations and leg swelling.  Gastrointestinal: Negative for  abdominal pain, blood in stool and nausea.  Genitourinary: Negative for dysuria and frequency.  Musculoskeletal: Positive for myalgias. Negative for falls.  Skin: Negative for rash.  Neurological: Negative for dizziness, loss of consciousness and headaches.  Endo/Heme/Allergies: Negative for environmental allergies.  Psychiatric/Behavioral: Negative for depression. The patient is not nervous/anxious.        Objective:    Physical Exam Constitutional:      Appearance: Normal appearance. He is not ill-appearing.  HENT:     Head: Normocephalic and atraumatic.  Right Ear: External ear normal.     Left Ear: External ear normal.     Nose: Nose normal.  Eyes:     General:        Right eye: No discharge.        Left eye: No discharge.  Pulmonary:     Effort: Pulmonary effort is normal.  Neurological:     Mental Status: He is alert.     There were no vitals taken for this visit. Wt Readings from Last 3 Encounters:  03/16/20 190 lb (86.2 kg)  11/28/19 188 lb (85.3 kg)  10/09/19 188 lb (85.3 kg)    Diabetic Foot Exam - Simple   No data filed     Lab Results  Component Value Date   WBC 5.1 10/01/2019   HGB 13.7 10/01/2019   HCT 40.0 10/01/2019   PLT 142.0 (L) 10/01/2019   GLUCOSE 99 10/01/2019   CHOL 198 04/19/2019   TRIG 171 (H) 04/19/2019   HDL 41 04/19/2019   LDLDIRECT 129.0 03/31/2017   LDLCALC 128 (H) 04/19/2019   ALT 21 04/19/2019   AST 23 04/19/2019   NA 139 10/01/2019   K 4.5 10/01/2019   CL 102 10/01/2019   CREATININE 0.94 10/01/2019   BUN 18 10/01/2019   CO2 31 10/01/2019   TSH 3.92 04/19/2019   PSA 0.6 04/19/2019    Lab Results  Component Value Date   TSH 3.92 04/19/2019   Lab Results  Component Value Date   WBC 5.1 10/01/2019   HGB 13.7 10/01/2019   HCT 40.0 10/01/2019   MCV 90.6 10/01/2019   PLT 142.0 (L) 10/01/2019   Lab Results  Component Value Date   NA 139 10/01/2019   K 4.5 10/01/2019   CO2 31 10/01/2019   GLUCOSE 99  10/01/2019   BUN 18 10/01/2019   CREATININE 0.94 10/01/2019   BILITOT 0.4 04/19/2019   ALKPHOS 41 08/01/2018   AST 23 04/19/2019   ALT 21 04/19/2019   PROT 6.5 04/19/2019   ALBUMIN 4.4 08/01/2018   CALCIUM 8.8 10/01/2019   ANIONGAP 7 05/29/2014   GFR 76.20 10/01/2019   Lab Results  Component Value Date   CHOL 198 04/19/2019   Lab Results  Component Value Date   HDL 41 04/19/2019   Lab Results  Component Value Date   LDLCALC 128 (H) 04/19/2019   Lab Results  Component Value Date   TRIG 171 (H) 04/19/2019   Lab Results  Component Value Date   CHOLHDL 4.8 04/19/2019   No results found for: HGBA1C     Assessment & Plan:   Problem List Items Addressed This Visit    Constipation due to outlet dysfunction    Continues to monitor with Lactulose, had dropped to a 1/2 dose as it was working too well but with current illness has had to increase back to a full dose with decent results.       COVID-19 virus infection    Patient first had COVID symptoms on 9/21, tested positive on 9/24 and underwent MAB infusion. He continues to struggle with a cough he now feels is in his chest more now. He notes fatigue, chills, anorexia and congestion are worsening. No fevers or loss of smell or taste. Concern for secondary bacterial infection. Started on Cefdinir, given Albuterol and tessalon perles to use prn. Encouraged increased rest and hydration, add probiotics, zinc such as Coldeze or Xicam. Treat fevers as needed      Relevant Medications  cefdinir (OMNICEF) 300 MG capsule      I am having George Owens start on cefdinir and albuterol. I am also having him maintain his loratadine, Calcium Carb-Cholecalciferol (CALCIUM 1000 + D PO), Vitamin D3, Multiple Vitamins-Minerals (PRESERVISION AREDS PO), naproxen, Denosumab (PROLIA [place]), Ranibizumab (LUCENTIS IO), vitamin B-12, glucosamine-chondroitin, lactulose, omeprazole, and Cheratussin AC.  Meds ordered this encounter  Medications   . cefdinir (OMNICEF) 300 MG capsule    Sig: Take 1 capsule (300 mg total) by mouth 2 (two) times daily for 10 days.    Dispense:  20 capsule    Refill:  0  . albuterol (VENTOLIN HFA) 108 (90 Base) MCG/ACT inhaler    Sig: Inhale 2 puffs into the lungs every 6 (six) hours as needed for wheezing or shortness of breath.    Dispense:  8 g    Refill:  0  . guaiFENesin-codeine (CHERATUSSIN AC) 100-10 MG/5ML syrup    Sig: Take 5 mLs by mouth 3 (three) times daily as needed for cough.    Dispense:  150 mL    Refill:  0    I discussed the assessment and treatment plan with the patient. The patient was provided an opportunity to ask questions and all were answered. The patient agreed with the plan and demonstrated an understanding of the instructions.   The patient was advised to call back or seek an in-person evaluation if the symptoms worsen or if the condition fails to improve as anticipated.  I provided 15 minutes of non-face-to-face time during this encounter.   Penni Homans, MD

## 2020-03-22 NOTE — Assessment & Plan Note (Signed)
Patient first had COVID symptoms on 9/21, tested positive on 9/24 and underwent MAB infusion. He continues to struggle with a cough he now feels is in his chest more now. He notes fatigue, chills, anorexia and congestion are worsening. No fevers or loss of smell or taste. Concern for secondary bacterial infection. Started on Cefdinir, given Albuterol and tessalon perles to use prn. Encouraged increased rest and hydration, add probiotics, zinc such as Coldeze or Xicam. Treat fevers as needed

## 2020-03-24 ENCOUNTER — Telehealth: Payer: Self-pay | Admitting: Internal Medicine

## 2020-03-24 NOTE — Telephone Encounter (Signed)
Patient tested positive for covid on 03/11/20. Symptoms at this time are, diarrhe, the feeling of ltosing control of urine during the night., clear. No cough , no fever, sore throat. Patient would like to be checked out.

## 2020-03-24 NOTE — Telephone Encounter (Signed)
Pt is not able to come in the office at this time. Would you like to do a virtual with him or would you prefer him to go to UC?

## 2020-03-24 NOTE — Telephone Encounter (Signed)
If I have a virtual this week,  I will talk to him unless he feels he needs to be seen  . As long as he is not having fevers ,  Pain when he urinates,  Watery stools or Blood in stool,  Desats/SOB, he can  take Imodium for the diarrhea and you can add him on to  Thursday  If I have nothing before

## 2020-03-25 ENCOUNTER — Ambulatory Visit
Admission: RE | Admit: 2020-03-25 | Discharge: 2020-03-25 | Disposition: A | Discharge status: Home / Self Care | Payer: PPO | Source: Ambulatory Visit | Attending: Internal Medicine | Admitting: Internal Medicine

## 2020-03-25 ENCOUNTER — Other Ambulatory Visit: Payer: Self-pay | Admitting: Internal Medicine

## 2020-03-25 DIAGNOSIS — R059 Cough, unspecified: Secondary | ICD-10-CM | POA: Diagnosis not present

## 2020-03-25 DIAGNOSIS — R058 Other specified cough: Secondary | ICD-10-CM

## 2020-03-25 NOTE — Telephone Encounter (Signed)
Spoke with pt and he stated that the diarrhea is under control right now. He stated that his main concern is the fact the the cough has came back. He stated that over the weekend he spoke with the on call doctor because he started coughing and had a rattling sound in his chest, the doctor prescribed him Cefdinir. Pt wanted to know if we could order a chest xray for him to have done at [facility] out patient imaging center to make sure he has not developed pneumonia since having covid.

## 2020-03-25 NOTE — Telephone Encounter (Signed)
Spoke with pt and informed him that the xray has been ordered and where he needs to go have it done at. Pt gave a verbal understanding.

## 2020-03-25 NOTE — Telephone Encounter (Signed)
Patient call wanted to should he get a flu shot now with all the things that is going right now  He is waiting on returning call from message yesterday about his diarrhea

## 2020-03-25 NOTE — Telephone Encounter (Signed)
Yes,.  Order for chest   X ray has been sent to [facility] outpatient imaging in [place]

## 2020-03-26 ENCOUNTER — Telehealth (INDEPENDENT_AMBULATORY_CARE_PROVIDER_SITE_OTHER): Payer: PPO | Admitting: Internal Medicine

## 2020-03-26 ENCOUNTER — Encounter: Payer: Self-pay | Admitting: Internal Medicine

## 2020-03-26 VITALS — Ht 76.0 in | Wt 190.0 lb

## 2020-03-26 DIAGNOSIS — J189 Pneumonia, unspecified organism: Secondary | ICD-10-CM

## 2020-03-26 MED ORDER — CHERATUSSIN AC 100-10 MG/5ML PO SOLN
5.0000 mL | Freq: Three times a day (TID) | ORAL | 0 refills | Status: DC | PRN
Start: 2020-03-26 — End: 2020-04-27

## 2020-03-26 NOTE — Patient Instructions (Signed)
Finish the cefdinir.  Continue taking a probiotic once daily afterward, for another two weeks  More rest,  Less exercise for now  Use 1/2 regular dose of lactulose while taking antibiotic   Plan to repeat your chest x ray on Nov 6 at [facility] will schedule you a follow up appt with me in the office on Nov 8

## 2020-03-26 NOTE — Progress Notes (Signed)
Virtual Visit via [place]  This visit type was conducted due to national recommendations for restrictions regarding the COVID-19 pandemic (e.g. social distancing).  This format is felt to be most appropriate for this patient at this time.  All issues noted in this document were discussed and addressed.  No physical exam was performed (except for noted visual exam findings with Video Visits).   I connected with@ on 03/26/20 at 11:30 AM EDT by a video enabled telemedicine application  and verified that I am speaking with the correct person using two identifiers. Location patient: home Location provider: work or home office Persons participating in the virtual visit: patient, provider  I discussed the limitations, risks, security and privacy concerns of performing an evaluation and management service by telephone and the availability of in person appointments. I also discussed with the patient that there may be a patient responsible charge related to this service. The patient expressed understanding and agreed to proceed.   Reason for visit: post COVID COUGH  HPI:  86 yr old male diagnosed with COVID infection on  SEPT 22.  Received the MoAb infusion,  Recovered uneventfully at home.  However his cough has failed to resolve,  And he has tried to resume his one mile walk but has noted decreased energy and increased dyspnea.  He was evaluated via E Chart for symptoms and Cefdinir  Along with an albuterol MDI were prescribed on October 2 .  He was sent for a chest x ray prior to today's video visit  , which showed a hazy LLL opacity   ROS: See pertinent positives and negatives per HPI.  Past Medical History:  Diagnosis Date  . Allergy   . Basal cell carcinoma    right temple Dr. Evorn Gong 12/2018  . Chicken pox   . Colon polyps   . GERD (gastroesophageal reflux disease)   . history of prostate CA 2006  . Hyperlipidemia   . Leg cramps   . Neuropathy   . Osteoporosis     Past Surgical  History:  Procedure Laterality Date  . CATARACT EXTRACTION Right   . CATARACT EXTRACTION W/PHACO Left 12/26/2017   Procedure: CATARACT EXTRACTION PHACO AND INTRAOCULAR LENS PLACEMENT ([place])  COMPLICATED LEFT;  Surgeon: Leandrew Koyanagi, MD;  Location: [facility];  Service: Ophthalmology;  Laterality: Left;  [place]  . colonoscopy    . EYE MUSCLE SURGERY Left   . TONSILLECTOMY AND ADENOIDECTOMY      Family History  Problem Relation Age of Onset  . Stroke Mother   . Arthritis Father   . Heart disease Father   . Brain cancer Brother        1/2 brother (possibly related to mother NOT related to patient)  . Colon cancer Neg Hx   . Esophageal cancer Neg Hx   . Pancreatic cancer Neg Hx   . Stomach cancer Neg Hx   . Liver disease Neg Hx     SOCIAL HX:  reports that he has never smoked. He has never used smokeless tobacco. He reports that he does not drink alcohol and does not use drugs.   Current Outpatient Medications:  .  albuterol (VENTOLIN HFA) 108 (90 Base) MCG/ACT inhaler, Inhale 2 puffs into the lungs every 6 (six) hours as needed for wheezing or shortness of breath., Disp: 8 g, Rfl: 0 .  Calcium Carb-Cholecalciferol (CALCIUM 1000 + D PO), Take 1 tablet by mouth daily. , Disp: , Rfl:  .  cefdinir (OMNICEF) 300  MG capsule, Take 1 capsule (300 mg total) by mouth 2 (two) times daily for 10 days., Disp: 20 capsule, Rfl: 0 .  Cholecalciferol (VITAMIN D3) 400 UNITS CAPS, Take 2 capsules by mouth daily. , Disp: , Rfl:  .  Denosumab (PROLIA [place]), Inject into the skin every 6 (six) months., Disp: , Rfl:  .  glucosamine-chondroitin 500-400 MG tablet, Take 1 tablet by mouth daily., Disp: , Rfl:  .  guaiFENesin-codeine (CHERATUSSIN AC) 100-10 MG/5ML syrup, Take 5 mLs by mouth 3 (three) times daily as needed for cough., Disp: 150 mL, Rfl: 0 .  lactulose (CHRONULAC) 10 GM/15ML solution, TAKE 30 MLS (20 G TOTAL) BY MOUTH 2 (TWO) TIMES DAILY., Disp: 1892 mL, Rfl: 3 .   loratadine (CLARITIN) 10 MG tablet, Take 10 mg by mouth daily as needed. , Disp: , Rfl:  .  Multiple Vitamins-Minerals (PRESERVISION AREDS PO), Take 2 capsules by mouth daily., Disp: , Rfl:  .  naproxen (NAPROSYN) 250 MG tablet, Take 250 mg by mouth as needed., Disp: , Rfl:  .  omeprazole (PRILOSEC) 40 MG capsule, TAKE 1 CAPSULE (40 MG TOTAL) BY MOUTH DAILY. IN THE MORNING 30 MINUTES PRIOR TO EATING, Disp: 90 capsule, Rfl: 3 .  Ranibizumab (LUCENTIS IO), Inject into the eye as needed. As needed every three months., Disp: , Rfl:  .  vitamin B-12 (CYANOCOBALAMIN) 1000 MCG tablet, Take 1,000 mcg by mouth daily., Disp: , Rfl:   EXAM:  VITALS per patient if applicable:  GENERAL: alert, oriented, appears well and in no acute distress  HEENT: atraumatic, conjunttiva clear, no obvious abnormalities on inspection of external nose and ears  NECK: normal movements of the head and neck  LUNGS: on inspection no signs of respiratory distress, breathing rate appears normal, no obvious gross SOB, gasping or wheezing  CV: no obvious cyanosis  MS: moves all visible extremities without noticeable abnormality  PSYCH/NEURO: pleasant and cooperative, no obvious depression or anxiety, speech and thought processing grossly intact  ASSESSMENT AND PLAN:  Discussed the following assessment and plan:  Pneumonia of left lower lobe due to infectious organism - Plan: DG Chest 2 View  LLL pneumonia SUGGESTED BY SYMPTOMS and chest   X ray.  Continue course of cefdinir.  Albuterol prn    cheratussin refilled for Cough suppression     I discussed the assessment and treatment plan with the patient. The patient was provided an opportunity to ask questions and all were answered. The patient agreed with the plan and demonstrated an understanding of the instructions.   The patient was advised to call back or seek an in-person evaluation if the symptoms worsen or if the condition fails to improve as anticipated.  I  provided 20  minutes of non-face-to-face time during this encounter.   George Mc, MD

## 2020-03-28 DIAGNOSIS — J189 Pneumonia, unspecified organism: Secondary | ICD-10-CM | POA: Insufficient documentation

## 2020-03-28 NOTE — Assessment & Plan Note (Signed)
SUGGESTED BY SYMPTOMS and chest   X ray.  Continue course of cefdinir.  Albuterol prn    cheratussin refilled for Cough suppression

## 2020-03-30 ENCOUNTER — Ambulatory Visit
Admission: EM | Admit: 2020-03-30 | Discharge: 2020-03-30 | Disposition: A | Discharge status: Home / Self Care | Payer: PPO | Attending: Family Medicine | Admitting: Family Medicine

## 2020-03-30 ENCOUNTER — Telehealth: Payer: Self-pay | Admitting: Internal Medicine

## 2020-03-30 DIAGNOSIS — U071 COVID-19: Secondary | ICD-10-CM

## 2020-03-30 DIAGNOSIS — J1282 Pneumonia due to coronavirus disease 2019: Secondary | ICD-10-CM

## 2020-03-30 MED ORDER — PREDNISONE 10 MG (21) PO TBPK: ORAL_TABLET | Freq: Every day | ORAL | 0 refills | Status: AC

## 2020-03-30 MED ORDER — AZITHROMYCIN 250 MG PO TABS: 250.0000 mg | ORAL_TABLET | Freq: Every day | ORAL | 0 refills | Status: DC

## 2020-03-30 NOTE — ED Triage Notes (Signed)
Patient reports he was diagnosed with COVID which developed into PNA. On course of Omnicef. Had followup xray last Wednesday--was still on antibiotic.   Has been taking albuterol and cough syrup. States minimal cough with cough medication.   Was unable to see primary care today. States that he doesn't feel completely better after antibiotic. No fever at home.   Would like someone to listen to his lungs. Denies shortness of breath.

## 2020-03-30 NOTE — Telephone Encounter (Signed)
Patient called wanted to know what he is to do now that he is out the medication  About the pneumonia.

## 2020-03-30 NOTE — Telephone Encounter (Signed)
Patient has been informed.

## 2020-03-30 NOTE — Telephone Encounter (Signed)
If he has no objective symptoms (fever,  Low oxygen saturation level,  Rapid heart rate,  ),  Then he is recovering as expected and just needs to give himself  more time  Patient should NOT be trying to "cough it all up."  This is a fallacy and leads to more irritation of the airways and a prolongation of the  cough .  If the cough is not suppressed with the Robitussin ,  I will prescribe a prednisone taper and a stronger cough medication.

## 2020-03-30 NOTE — Telephone Encounter (Signed)
See nurse triage message.

## 2020-03-30 NOTE — ED Provider Notes (Signed)
[facility]   [id number] 03/30/20 Arrival Time: 9562   CC: COVID symptoms  SUBJECTIVE: History from: patient.  George Owens is a 86 y.o. male who presents with abrupt onset of nasal congestion, PND, and persistent dry cough for yjr ;sdy 2 weeks. Had positive Covid test on 03/11/20. Has completed Covid vaccines. Had a video visit with primary care and CXR on 03/25/20 that showed "hazy opacity in the peripheral lower Left lung that may represent atelectasis or infection." Was treated with albuterol and Omnicef. Still coughing and having some chest tightness with deep breathing. Does not radiate. Feels fatigued, no fever. Has not taken OTC medications for this. SOB is worsened by activity. Denies previous symptoms in the past. Denies fever, chills, sinus pain, rhinorrhea, sore throat,  wheezing, nausea, changes in bowel or bladder habits.    ROS: As per HPI.  All other pertinent ROS negative.     Past Medical History:  Diagnosis Date  . Allergy   . Basal cell carcinoma    right temple Dr. Evorn Gong 12/2018  . Chicken pox   . Colon polyps   . GERD (gastroesophageal reflux disease)   . history of prostate CA 2006  . Hyperlipidemia   . Leg cramps   . Neuropathy   . Osteoporosis    Past Surgical History:  Procedure Laterality Date  . CATARACT EXTRACTION Right   . CATARACT EXTRACTION W/PHACO Left 12/26/2017   Procedure: CATARACT EXTRACTION PHACO AND INTRAOCULAR LENS PLACEMENT ([place])  COMPLICATED LEFT;  Surgeon: Leandrew Koyanagi, MD;  Location: [facility];  Service: Ophthalmology;  Laterality: Left;  [place]  . colonoscopy    . EYE MUSCLE SURGERY Left   . TONSILLECTOMY AND ADENOIDECTOMY     Allergies  Allergen Reactions  . Kenalog [Triamcinolone Acetonide]     Blindness X 3 Days   No current facility-administered medications on file prior to encounter.   Current Outpatient Medications on File Prior to Encounter  Medication Sig Dispense Refill  .  albuterol (VENTOLIN HFA) 108 (90 Base) MCG/ACT inhaler Inhale 2 puffs into the lungs every 6 (six) hours as needed for wheezing or shortness of breath. 8 g 0  . cefdinir (OMNICEF) 300 MG capsule Take 1 capsule (300 mg total) by mouth 2 (two) times daily for 10 days. 20 capsule 0  . guaiFENesin-codeine (CHERATUSSIN AC) 100-10 MG/5ML syrup Take 5 mLs by mouth 3 (three) times daily as needed for cough. 150 mL 0  . Calcium Carb-Cholecalciferol (CALCIUM 1000 + D PO) Take 1 tablet by mouth daily.     . Cholecalciferol (VITAMIN D3) 400 UNITS CAPS Take 2 capsules by mouth daily.     . Denosumab (PROLIA [place]) Inject into the skin every 6 (six) months.    [name] glucosamine-chondroitin 500-400 MG tablet Take 1 tablet by mouth daily.    [name] lactulose (CHRONULAC) 10 GM/15ML solution TAKE 30 MLS (20 G TOTAL) BY MOUTH 2 (TWO) TIMES DAILY. 1892 mL 3  . loratadine (CLARITIN) 10 MG tablet Take 10 mg by mouth daily as needed.     . Multiple Vitamins-Minerals (PRESERVISION AREDS PO) Take 2 capsules by mouth daily.    . naproxen (NAPROSYN) 250 MG tablet Take 250 mg by mouth as needed.    [name] omeprazole (PRILOSEC) 40 MG capsule TAKE 1 CAPSULE (40 MG TOTAL) BY MOUTH DAILY. IN THE MORNING 30 MINUTES PRIOR TO EATING 90 capsule 3  . Ranibizumab (LUCENTIS IO) Inject into the eye as needed. As needed every  three months.    . vitamin B-12 (CYANOCOBALAMIN) 1000 MCG tablet Take 1,000 mcg by mouth daily.     Social History   Socioeconomic History  . Marital status: Married    Spouse name: Eloise  . Number of children: 4  . Years of education: Not on file  . Highest education level: Not on file  Occupational History  . Occupation: retired  Tobacco Use  . Smoking status: Never Smoker  . Smokeless tobacco: Never Used  Vaping Use  . Vaping Use: Never used  Substance and Sexual Activity  . Alcohol use: No  . Drug use: No  . Sexual activity: Yes    Partners: Female  Other Topics Concern  . Not on file  Social History Narrative    Lives with wife in a 5 story retirement home.  He lives on the first floor.  4 year college degree.  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     Social Determinants of Health   Financial Resource Strain: Low Risk   . Difficulty of Paying Living Expenses: Not hard at all  Food Insecurity: No Food Insecurity  . Worried About Charity fundraiser in the Last Year: Never true  . Ran Out of Food in the Last Year: Never true  Transportation Needs: No Transportation Needs  . Lack of Transportation (Medical): No  . Lack of Transportation (Non-Medical): No  Physical Activity: Sufficiently Active  . Days of Exercise per Week: 5 days  . Minutes of Exercise per Session: 30 min  Stress: No Stress Concern Present  . Feeling of Stress : Not at all  Social Connections:   . Frequency of Communication with Friends and Family: Not on file  . Frequency of Social Gatherings with Friends and Family: Not on file  . Attends Religious Services: Not on file  . Active Member of Clubs or Organizations: Not on file  . Attends Archivist Meetings: Not on file  . Marital Status: Not on file  Intimate Partner Violence: Not At Risk  . Fear of Current or Ex-Partner: No  . Emotionally Abused: No  . Physically Abused: No  . Sexually Abused: No   Family History  Problem Relation Age of Onset  . Stroke Mother   . Arthritis Father   . Heart disease Father   . Brain cancer Brother        1/2 brother (possibly related to mother NOT related to patient)  . Colon cancer Neg Hx   . Esophageal cancer Neg Hx   . Pancreatic cancer Neg Hx   . Stomach cancer Neg Hx   . Liver disease Neg Hx     OBJECTIVE:  Vitals:   03/30/20 1538  BP: 128/77  Pulse: 82  Resp: 16  Temp: 98.5 F (36.9 C)  TempSrc: Oral  SpO2: 98%     General appearance: alert; appears fatigued, but nontoxic; speaking in full sentences and tolerating own secretions HEENT: NCAT; Ears: EACs clear, TMs pearly  gray; Eyes: PERRL.  EOM grossly intact. Sinuses: nontender; Nose: nares patent without rhinorrhea, Throat: oropharynx clear, tonsils non erythematous or enlarged, uvula midline  Neck: supple without LAD Lungs: unlabored respirations, symmetrical air entry; cough: mild; no respiratory distress; coarse lung sounds to L lower lobe, mild wheezing to R lower lobe, otherwise CTA Heart: regular rate and rhythm.  Radial pulses 2+ symmetrical bilaterally Skin: warm and dry Psychological: alert and cooperative; normal mood and affect  LABS:  No results found for this or any previous visit (from the past 24 hour(s)).   ASSESSMENT & PLAN:  1. Pneumonia due to COVID-19 virus     Meds ordered this encounter  Medications  . azithromycin (ZITHROMAX) 250 MG tablet    Sig: Take 1 tablet (250 mg total) by mouth daily. Take first 2 tablets together, then 1 every day until finished.    Dispense:  6 tablet    Refill:  0    Order Specific Question:   Supervising Provider    Answer:   Chase Picket [id number]  . predniSONE (STERAPRED UNI-PAK 21 TAB) 10 MG (21) TBPK tablet    Sig: Take by mouth daily for 6 days. Take 6 tablets on day 1, 5 tablets on day 2, 4 tablets on day 3, 3 tablets on day 4, 2 tablets on day 5, 1 tablet on day 6    Dispense:  21 tablet    Refill:  0    Order Specific Question:   Supervising Provider    Answer:   Chase Picket [id number]    Spacer for inhaler given in office with instructions for use with teach back Incentive spirometer given in office today, instruction for use provided with patient using device correctly before leaving the office Prescribed azithromycin Prescribed steroid taper Continue with albuterol inhaler as prescribed Get plenty of rest and push fluids Use OTC zyrtec for nasal congestion, runny nose, and/or sore throat Use OTC flonase for nasal congestion and runny nose Use medications daily for symptom relief Use OTC medications like ibuprofen or  tylenol as needed fever or pain Call or go to the ED if you have any new or worsening symptoms such as fever, worsening cough, shortness of breath, chest tightness, chest pain, turning blue, changes in mental status.  Reviewed expectations re: course of current medical issues. Questions answered. Outlined signs and symptoms indicating need for more acute intervention. Patient verbalized understanding. After Visit Summary given.         Faustino Congress, NP 03/31/20 [phone]

## 2020-03-30 NOTE — Telephone Encounter (Signed)
Patient stated he feels he needs to be seen. He wants to make sure this is getting better. He doesn't have anymore sx but felling off, he is also not coughing anymore either.Patient wants to know if he is done taking ABX does he need to worry, what is the Plan for further tx? Should he be continuing the cough medicine since he is done with abx? Should he continue the mucinex? He will be going to UC for appointment once he has transportation. If Dr Derrel Nip says otherwise he wont go.He would like an answer before 4pm so he can go to UC or not. Please advise.

## 2020-03-30 NOTE — Telephone Encounter (Signed)
Tell him to go  to the UC APPT so his many questions can be addressed I n  person .

## 2020-03-30 NOTE — Discharge Instructions (Addendum)
I have given you a spacer to use with your inhaler. Use this 2 puffs every 4 hours as needed for chest tightness, wheezing, shortness of breath.  Use the incentive spirometer every 4-6 hours to help exercise your lungs to keep them open.  I have sent in a prednisone taper for you to take for 6 days. 6 tablets on day one, 5 tablets on day two, 4 tablets on day three, 3 tablets on day four, 2 tablets on day five, and 1 tablet on day six.  I have also sent in azithromycin for you to take. Take 2 tablets today and one tablet daily for the next 4 days.  Follow up with this office or with primary care for another chest xray 04/22/20  Follow up with the ER for trouble swallowing, trouble breathing, other concerning symptoms.

## 2020-03-30 NOTE — Telephone Encounter (Signed)
FYI from access nurse. They instructed patient to go UC in [place]. Caller states he should be recovering from covid but an x ray still showed a shadow of pneumonia. He is very weak and feels heavy and pressure in his chest. Caller doesn't think the medication is helping. He isn't coughing and almost feels like he should be to cough everything up. Robittusin was ordered by the doctor and as well as the antibiotic. no fever.     [facility] Night - Cl TELEPHONE ADVICE RECORD [facility] Patient Name: CLARIS GUYMON Gender: Male DOB: 1932/02/06 Age: 86 Y 10 M 22 D Return Phone Number: [phone] (Primary), [phone] (Secondary) Address: City/State/Zip: [place] [address] Client [facility] Night - Cl Client Site [place] Physician Deborra Medina - MD Contact Type Call Who Is Calling Patient / Member / Family / Caregiver Call Type Triage / Clinical Relationship To Patient Self Return Phone Number [phone] (Primary) Chief Complaint CHEST PAIN (>=21 years) - pain, pressure, heaviness or tightness Reason for Call Symptomatic / Request for [place] states he should be recovering from covid but an x ray still showed a shadow of pneumonia. He is very weak and feels heavy and pressure in his chest. Caller doesn't think the medication is helping. He isn't coughing and almost feels like he should be to cough everything up. Translation No Nurse Assessment Nurse: Alba Destine, RN, Melony Overly Date/Time (Eastern Time): 03/28/2020 12:49:47 PM Confirm and document reason for call. If symptomatic, describe symptoms. ---Caller states he should be recovering from covid but an x ray still showed a shadow of pneumonia. He is very weak and feels heavy and pressure in his chest. Caller doesn't think the medication is helping. He isn't coughing and almost feels like  he should be to cough everything up. Robittusin was ordered by the doctor and as well as the antibiotic. no fever. Does the patient have any new or worsening symptoms? ---Yes Will a triage be completed? ---Yes Related visit to physician within the last 2 weeks? ---Yes Does the PT have any chronic conditions? (i.e. diabetes, asthma, this includes High risk factors for pregnancy, etc.) ---No Is this a behavioral health or substance abuse call? ---No Guidelines Guideline Title Affirmed Question Affirmed Notes Nurse Date/Time [name] Time) Chest Pain [1] Chest pain(s) lasting a few seconds AND [2] persists > 3 days Clemencia Course, Melony Overly 03/28/2020 12:53:37 PM Disp. Time [name] Time) Disposition Final User 03/28/2020 12:48:16 PM Send to Urgent Queue [place], [place] PLEASE NOTE: All timestamps contained within this report are represented as [place] Standard Time. CONFIDENTIALTY NOTICE: This fax transmission is intended only for the addressee. It contains information that is legally privileged, confidential or otherwise protected from use or disclosure. If you are not the intended recipient, you are strictly prohibited from reviewing, disclosing, copying using or disseminating any of this information or taking any action in reliance on or regarding this information. If you have received this fax in error, please notify us immediately by telephone so that we can arrange for its return to [place]. Phone: [phone], Toll-Free: [phone], Fax: [phone] Page: 2 of 2 Call Id: [id number] 03/28/2020 1:01:14 PM SEE PCP WITHIN 3 DAYS Yes Alba Destine, RN, Melony Overly Caller Disagree/Comply Comply Caller Understands Yes PreDisposition Did not know what to do Care Advice Given Per Guideline SEE PCP WITHIN 3 DAYS: * PCP VISIT: Call your doctor (or NP/PA) during regular office hours and make an appointment. A  clinic or urgent care center are good places to go for care if your doctor's office is closed or you can't  get an appointment. NOTE: If office will be open tomorrow, tell caller to call then, not in 3 days. * You need to be seen within 2 or 3 days. PAIN MEDICINES: * ACETAMINOPHEN - REGULAR STRENGTH TYLENOL: Take 650 mg (two 325 mg pills) by mouth every 4 to 6 hours as needed. Each Regular Strength Tylenol pill has 325 mg of acetaminophen. The most you should take each day is 3,250 mg (10 pills a day). * They are over-the-counter (OTC) pain drugs. You can buy them at the drugstore. * IBUPROFEN (E.G., MOTRIN, ADVIL): Take 400 mg (two 200 mg pills) by mouth every 6 hours. The most you should take each day is 1,200 mg (six 200 mg pills), unless your doctor has told you to take more. * For pain relief, you can take either acetaminophen, ibuprofen, or naproxen. CALL BACK IF: * Chest pain increases in frequency, duration or severity * Chest pain lasts over 5 minutes * Difficulty breathing or unusual sweating occurs * Fever over 100.4 F (38.0 C) * You become worse CARE ADVICE given per Chest Pain (Adult) guideline. Comments User: Baxter Flattery, RN Date/Time [name] Time): 03/28/2020 1:01:05 PM caller was given information of the UC near by for covid testing. Referrals [facility] [place]

## 2020-04-02 ENCOUNTER — Telehealth: Payer: Self-pay

## 2020-04-02 NOTE — Telephone Encounter (Signed)
Please advise 

## 2020-04-02 NOTE — Telephone Encounter (Signed)
He should refer to his AV Summary from last visit which states that he should  Repeat chest x ray on Nov 6 at [facility]  !!!

## 2020-04-02 NOTE — Telephone Encounter (Signed)
Pt wants to know if he needs xray appt before  04/27/20 appt

## 2020-04-03 NOTE — Telephone Encounter (Signed)
I called patient to let him know that he should repeat chest xray at [facility] 11/6. He asked why he was having this done. If it was an insurance issue or what? I was coming in on this & I know he had pneumonia due to covid . I told him possibly Dr. Derrel Nip just wanted to check & make sure no scarring in lungs. He was also upset that Dr. Derrel Nip did not see him during this that he had to go to [facility]. I explained him that if we also get sick we cannot be here to treat everyone else. Pt was somewhat abrupt & said thanks then hung up the phone.

## 2020-04-06 ENCOUNTER — Telehealth: Payer: Self-pay

## 2020-04-06 DIAGNOSIS — K521 Toxic gastroenteritis and colitis: Secondary | ICD-10-CM

## 2020-04-06 DIAGNOSIS — T3695XA Adverse effect of unspecified systemic antibiotic, initial encounter: Secondary | ICD-10-CM

## 2020-04-06 DIAGNOSIS — R253 Fasciculation: Secondary | ICD-10-CM

## 2020-04-06 DIAGNOSIS — H01022 Squamous blepharitis right lower eyelid: Secondary | ICD-10-CM | POA: Diagnosis not present

## 2020-04-06 DIAGNOSIS — R351 Nocturia: Secondary | ICD-10-CM

## 2020-04-06 NOTE — Telephone Encounter (Signed)
Pt wanted you to give him a call as soon as possible. He said he can't sleep and his body is twitching. He believes something is wrong. He then said he would do a virtual and to schedule first available. I scheduled him for 10/20 but he really wants you to call him.

## 2020-04-06 NOTE — Telephone Encounter (Signed)
Spoke with pt and due to the symptoms that he is still having he was advised that he will need to go [facility] to have his labs done. Pt stated that he would go over there tomorrow.

## 2020-04-06 NOTE — Telephone Encounter (Signed)
Please schedule him a la visit for tomorrow morning ,  I have ordered labs urine and stool studies

## 2020-04-06 NOTE — Telephone Encounter (Signed)
Spoke with pt and he stated that he has finished both the prednisone and the azithromycin. His O2 is staying around 96% and temp around 97.5. Pt was also given a spirometer at one of his UC visits and he stated that he has been using that. However he stated that he is still weak, having excessive urination, about 2-3 loose not watery stools a day, some chest tightness, unable to sleep even with taking Melatonin 10 mg and he is having body twitching at night when he lays down. He stated that the twitching is in different places at different times but he only notices it at night time. Pt is scheduled for a virtual visit with you on Thursday to discuss.

## 2020-04-07 ENCOUNTER — Other Ambulatory Visit
Admission: RE | Admit: 2020-04-07 | Discharge: 2020-04-07 | Disposition: A | Discharge status: Home / Self Care | Payer: PPO | Attending: Internal Medicine | Admitting: Internal Medicine

## 2020-04-07 DIAGNOSIS — R351 Nocturia: Secondary | ICD-10-CM | POA: Diagnosis not present

## 2020-04-07 DIAGNOSIS — R253 Fasciculation: Secondary | ICD-10-CM | POA: Insufficient documentation

## 2020-04-07 LAB — BASIC METABOLIC PANEL
Anion gap: 7 (ref 5–15)
BUN: 22 mg/dL (ref 8–23)
CO2: 30 mmol/L (ref 22–32)
Calcium: 9 mg/dL (ref 8.9–10.3)
Chloride: 99 mmol/L (ref 98–111)
Creatinine, Ser: 1.02 mg/dL (ref 0.61–1.24)
GFR, Estimated: >60 mL/min (ref >60)
Glucose, Bld: 97 mg/dL (ref 70–99)
Potassium: 3.9 mmol/L (ref 3.5–5.1)
Sodium: 136 mmol/L (ref 135–145)

## 2020-04-07 LAB — URINALYSIS, ROUTINE W REFLEX MICROSCOPIC
Bilirubin Urine: NEGATIVE
Glucose, UA: NEGATIVE mg/dL
Hgb urine dipstick: NEGATIVE
Ketones, ur: NEGATIVE mg/dL
Leukocytes,Ua: NEGATIVE
Nitrite: NEGATIVE
Protein, ur: NEGATIVE mg/dL
Specific Gravity, Urine: 1.014 (ref 1.005–1.030)
pH: 6 (ref 5.0–8.0)

## 2020-04-07 LAB — C DIFFICILE QUICK SCREEN W PCR REFLEX
C Diff antigen: NEGATIVE
C Diff interpretation: NOT DETECTED
C Diff toxin: NEGATIVE

## 2020-04-07 LAB — MAGNESIUM: Magnesium: 2.2 mg/dL (ref 1.7–2.4)

## 2020-04-07 LAB — CK: Total CK: 75 U/L (ref 49–397)

## 2020-04-07 NOTE — Addendum Note (Signed)
Addended by: Santiago Bur on: 04/07/2020 11:06 AM   Modules accepted: Orders

## 2020-04-08 ENCOUNTER — Other Ambulatory Visit: Payer: Self-pay

## 2020-04-08 ENCOUNTER — Telehealth (INDEPENDENT_AMBULATORY_CARE_PROVIDER_SITE_OTHER): Payer: PPO | Admitting: Internal Medicine

## 2020-04-08 DIAGNOSIS — R198 Other specified symptoms and signs involving the digestive system and abdomen: Secondary | ICD-10-CM

## 2020-04-08 DIAGNOSIS — U071 COVID-19: Secondary | ICD-10-CM | POA: Diagnosis not present

## 2020-04-08 DIAGNOSIS — J189 Pneumonia, unspecified organism: Secondary | ICD-10-CM | POA: Diagnosis not present

## 2020-04-08 NOTE — Progress Notes (Signed)
Virtual Visit via [place]  This visit type was conducted due to national recommendations for restrictions regarding the COVID-19 pandemic (e.g. social distancing).  This format is felt to be most appropriate for this patient at this time.  All issues noted in this document were discussed and addressed.  No physical exam was performed (except for noted visual exam findings with Video Visits).   I connected with@ on 04/08/20 at  4:30 PM EDT by a video enabled telemedicine application or telephone and verified that I am speaking with the correct person using two identifiers. Location patient: home Location provider: work or home office Persons participating in the virtual visit: patient, provider  I discussed the limitations, risks, security and privacy concerns of performing an evaluation and management service by telephone and the availability of in person appointments. I also discussed with the patient that there may be a patient responsible charge related to this service. The patient expressed understanding and agreed to proceed.   Reason for visit: follow up on post COVID pneumonia and multiple symptoms since recent COVID infection   HPI:  86 yr old male recently diagnosed COVID 13 infection diagnosed Sept 24 after symptom onset 9/21.   Treated with MoaB,,  Had persistent cough,, treated with Cefdinir on Oct 2 for PNA , confirmed with chest x ray notable for  LLL infiltrate on October 6.  Treated again on Oct 11 at Urgent Care ,  Given azithromycin and prednisone taper for persistent cough without fevers.  Called the office on Oct 18 with continued concerns despte His O2 is staying around 96% and temp around 97.5. Pt was also given a spirometer at one of his UC visits and he stated that he has been using that. However he stated that he is still weak, having excessive urination, about 2-3 loose not watery stools a day, some chest tightness, unable to sleep even with taking Melatonin 10 mg and he  is having body twitching at night when he lays down. He stated that the twitching is in different places at different times but he only notices it at night timeSent for bloodwork yesterday.  c diff negative,  Ck normal.  Lytes normal.  UA negative for glucose,  Bacteria and leukocytes   He feels somewhat better over the last 12 hours . Had a miserable night last night due to excessive urination and muscle twitching.  Last dose of prednisone was Sunday.  Bowels moving and no nausea,  But worried that he has a bowel blockage.  Appetite still off and has lost 3 lbs this week .  Still fatigues easily but denies shortness of breath,  02 stas 96% , notes that HR increases to 85-88 with ambulation and 75 at rest   Worried that his prostate ca may have spread to his ribs since he has pain with twisting and was told several years ago that his rib films looked abnormal    ROS: See pertinent positives and negatives per HPI.  Past Medical History:  Diagnosis Date  . Allergy   . Basal cell carcinoma    right temple Dr. Evorn Gong 12/2018  . Chicken pox   . Colon polyps   . GERD (gastroesophageal reflux disease)   . history of prostate CA 2006  . Hyperlipidemia   . Leg cramps   . Neuropathy   . Osteoporosis     Past Surgical History:  Procedure Laterality Date  . CATARACT EXTRACTION Right   . CATARACT EXTRACTION W/PHACO Left 12/26/2017  Procedure: CATARACT EXTRACTION PHACO AND INTRAOCULAR LENS PLACEMENT ([place])  COMPLICATED LEFT;  Surgeon: Leandrew Koyanagi, MD;  Location: [facility];  Service: Ophthalmology;  Laterality: Left;  [place]  . colonoscopy    . EYE MUSCLE SURGERY Left   . TONSILLECTOMY AND ADENOIDECTOMY      Family History  Problem Relation Age of Onset  . Stroke Mother   . Arthritis Father   . Heart disease Father   . Brain cancer Brother        1/2 brother (possibly related to mother NOT related to patient)  . Colon cancer Neg Hx   . Esophageal cancer Neg  Hx   . Pancreatic cancer Neg Hx   . Stomach cancer Neg Hx   . Liver disease Neg Hx     SOCIAL HX:  reports that he has never smoked. He has never used smokeless tobacco. He reports that he does not drink alcohol and does not use drugs.   Current Outpatient Medications:  .  albuterol (VENTOLIN HFA) 108 (90 Base) MCG/ACT inhaler, Inhale 2 puffs into the lungs every 6 (six) hours as needed for wheezing or shortness of breath., Disp: 8 g, Rfl: 0 .  azithromycin (ZITHROMAX) 250 MG tablet, Take 1 tablet (250 mg total) by mouth daily. Take first 2 tablets together, then 1 every day until finished., Disp: 6 tablet, Rfl: 0 .  Calcium Carb-Cholecalciferol (CALCIUM 1000 + D PO), Take 1 tablet by mouth daily. , Disp: , Rfl:  .  Cholecalciferol (VITAMIN D3) 400 UNITS CAPS, Take 2 capsules by mouth daily. , Disp: , Rfl:  .  Denosumab (PROLIA [place]), Inject into the skin every 6 (six) months., Disp: , Rfl:  .  glucosamine-chondroitin 500-400 MG tablet, Take 1 tablet by mouth daily., Disp: , Rfl:  .  guaiFENesin-codeine (CHERATUSSIN AC) 100-10 MG/5ML syrup, Take 5 mLs by mouth 3 (three) times daily as needed for cough., Disp: 150 mL, Rfl: 0 .  lactulose (CHRONULAC) 10 GM/15ML solution, TAKE 30 MLS (20 G TOTAL) BY MOUTH 2 (TWO) TIMES DAILY., Disp: 1892 mL, Rfl: 3 .  loratadine (CLARITIN) 10 MG tablet, Take 10 mg by mouth daily as needed. , Disp: , Rfl:  .  Multiple Vitamins-Minerals (PRESERVISION AREDS PO), Take 2 capsules by mouth daily., Disp: , Rfl:  .  naproxen (NAPROSYN) 250 MG tablet, Take 250 mg by mouth as needed., Disp: , Rfl:  .  omeprazole (PRILOSEC) 40 MG capsule, TAKE 1 CAPSULE (40 MG TOTAL) BY MOUTH DAILY. IN THE MORNING 30 MINUTES PRIOR TO EATING, Disp: 90 capsule, Rfl: 3 .  Ranibizumab (LUCENTIS IO), Inject into the eye as needed. As needed every three months., Disp: , Rfl:  .  vitamin B-12 (CYANOCOBALAMIN) 1000 MCG tablet, Take 1,000 mcg by mouth daily., Disp: , Rfl:   EXAM:  VITALS per  patient if applicable:  GENERAL: alert, oriented, appears well and in no acute distress  HEENT: atraumatic, conjunttiva clear, no obvious abnormalities on inspection of external nose and ears  NECK: normal movements of the head and neck  LUNGS: on inspection no signs of respiratory distress, breathing rate appears normal, no obvious gross SOB, gasping or wheezing  CV: no obvious cyanosis  MS: moves all visible extremities without noticeable abnormality  PSYCH/NEURO: pleasant and cooperative, no obvious depression or anxiety, speech and thought processing grossly intact  ASSESSMENT AND PLAN:  Discussed the following assessment and plan:  Change in bowel function  Pneumonia of left lower lobe due to  infectious organism  COVID-19 virus infection  Change in bowel function Secondary to laxative use.  C diff negative   LLL pneumonia S/p antibiotic treatment x 2,  Repeat cxr in 4 weeks   COVID-19 virus infection Resolved,  Energy is slowly improving .  All other symptoms appear to be due to se of prednisone     I discussed the assessment and treatment plan with the patient. The patient was provided an opportunity to ask questions and all were answered. The patient agreed with the plan and demonstrated an understanding of the instructions.   The patient was advised to call back or seek an in-person evaluation if the symptoms worsen or if the condition fails to improve as anticipated.  I provided 30 minutes of face-to-face time during this encounter.   Crecencio Mc, MD

## 2020-04-09 LAB — URINE CULTURE: Culture: <10000 — AB

## 2020-04-09 NOTE — Assessment & Plan Note (Signed)
Secondary to laxative use.  C diff negative

## 2020-04-09 NOTE — Progress Notes (Signed)
Your labs are normal,  as we discussed yesterday  you do NOT have a UTI.  Regards,   Deborra Medina, MD

## 2020-04-09 NOTE — Assessment & Plan Note (Signed)
S/p antibiotic treatment x 2,  Repeat cxr in 4 weeks

## 2020-04-09 NOTE — Assessment & Plan Note (Signed)
Resolved,  Energy is slowly improving .  All other symptoms appear to be due to se of prednisone

## 2020-04-10 ENCOUNTER — Ambulatory Visit: Payer: PPO

## 2020-04-10 DIAGNOSIS — H35433 Paving stone degeneration of retina, bilateral: Secondary | ICD-10-CM | POA: Diagnosis not present

## 2020-04-10 DIAGNOSIS — H31093 Other chorioretinal scars, bilateral: Secondary | ICD-10-CM | POA: Diagnosis not present

## 2020-04-10 DIAGNOSIS — H353223 Exudative age-related macular degeneration, left eye, with inactive scar: Secondary | ICD-10-CM | POA: Diagnosis not present

## 2020-04-10 DIAGNOSIS — H353211 Exudative age-related macular degeneration, right eye, with active choroidal neovascularization: Secondary | ICD-10-CM | POA: Diagnosis not present

## 2020-04-13 ENCOUNTER — Ambulatory Visit (INDEPENDENT_AMBULATORY_CARE_PROVIDER_SITE_OTHER): Payer: PPO

## 2020-04-13 VITALS — Ht 76.0 in | Wt 190.0 lb

## 2020-04-13 DIAGNOSIS — Z0000 Encounter for general adult medical examination without abnormal findings: Secondary | ICD-10-CM | POA: Diagnosis not present

## 2020-04-13 NOTE — Patient Instructions (Addendum)
George Owens , Thank you for taking time to come for your Medicare Wellness Visit. I appreciate your ongoing commitment to your health goals. Please review the following plan we discussed and let me know if I can assist you in the future.   These are the goals we discussed: Goals    . Follow up with Primary Care Provider       This is a list of the screening recommended for you and due dates:  Health Maintenance  Topic Date Due  . Flu Shot  09/17/2020*  . Tetanus Vaccine  05/01/2023  . COVID-19 Vaccine  Completed  . Pneumonia vaccines  Completed  *Topic was postponed. The date shown is not the original due date.    Immunizations Immunization History  Administered Date(s) Administered  . Influenza Split 04/18/2013  . Influenza, High Dose Seasonal PF 03/30/2016  . Influenza-Unspecified 03/06/2017, 03/21/2018, 03/28/2019  . PFIZER SARS-COV-2 Vaccination 06/25/2019, 07/24/2019  . Pneumococcal Conjugate-13 10/04/2013  . Pneumococcal Polysaccharide-23 02/28/2012  . Tdap 04/30/2013  . Zoster 11/24/2010  . Zoster Recombinat (Shingrix) 05/25/2018, 08/06/2018, 02/25/2019   Keep all routine maintenance appointments.   Follow up 04/14/20 @ 3:30  Follow up 04/27/20 @ 10:30  Advanced directives: End of life planning; Advance aging; Advanced directives discussed.  Copy of current HCPOA/Living Will requested.   Update 5 wishes.   Follow up in one year for your annual wellness visit.   Preventive Care 33 Years and Older, Male Preventive care refers to lifestyle choices and visits with your health care provider that can promote health and wellness. What does preventive care include?  A yearly physical exam. This is also called an annual well check.  Dental exams once or twice a year.  Routine eye exams. Ask your health care provider how often you should have your eyes checked.  Personal lifestyle choices, including:  Daily care of your teeth and gums.  Regular physical  activity.  Eating a healthy diet.  Avoiding tobacco and drug use.  Limiting alcohol use.  Practicing safe sex.  Taking low doses of aspirin every day.  Taking vitamin and mineral supplements as recommended by your health care provider. What happens during an annual well check? The services and screenings done by your health care provider during your annual well check will depend on your age, overall health, lifestyle risk factors, and family history of disease. Counseling  Your health care provider may ask you questions about your:  Alcohol use.  Tobacco use.  Drug use.  Emotional well-being.  Home and relationship well-being.  Sexual activity.  Eating habits.  History of falls.  Memory and ability to understand (cognition).  Work and work Statistician. Screening  You may have the following tests or measurements:  Height, weight, and BMI.  Blood pressure.  Lipid and cholesterol levels. These may be checked every 5 years, or more frequently if you are over 53 years old.  Skin check.  Lung cancer screening. You may have this screening every year starting at age 1 if you have a 30-pack-year history of smoking and currently smoke or have quit within the past 15 years.  Fecal occult blood test (FOBT) of the stool. You may have this test every year starting at age 69.  Flexible sigmoidoscopy or colonoscopy. You may have a sigmoidoscopy every 5 years or a colonoscopy every 10 years starting at age 92.  Prostate cancer screening. Recommendations will vary depending on your family history and other risks.  Hepatitis C blood test.  Hepatitis B blood test.  Sexually transmitted disease (STD) testing.  Diabetes screening. This is done by checking your blood sugar (glucose) after you have not eaten for a while (fasting). You may have this done every 1-3 years.  Abdominal aortic aneurysm (AAA) screening. You may need this if you are a current or former  smoker.  Osteoporosis. You may be screened starting at age 42 if you are at high risk. Talk with your health care provider about your test results, treatment options, and if necessary, the need for more tests. Vaccines  Your health care provider may recommend certain vaccines, such as:  Influenza vaccine. This is recommended every year.  Tetanus, diphtheria, and acellular pertussis (Tdap, Td) vaccine. You may need a Td booster every 10 years.  Zoster vaccine. You may need this after age 36.  Pneumococcal 13-valent conjugate (PCV13) vaccine. One dose is recommended after age 57.  Pneumococcal polysaccharide (PPSV23) vaccine. One dose is recommended after age 59. Talk to your health care provider about which screenings and vaccines you need and how often you need them. This information is not intended to replace advice given to you by your health care provider. Make sure you discuss any questions you have with your health care provider. Document Released: 07/03/2015 Document Revised: 02/24/2016 Document Reviewed: 04/07/2015 Elsevier Interactive Patient Education  2017 [place] Prevention in the Home Falls can cause injuries. They can happen to people of all ages. There are many things you can do to make your home safe and to help prevent falls. What can I do on the outside of my home?  Regularly fix the edges of walkways and driveways and fix any cracks.  Remove anything that might make you trip as you walk through a door, such as a raised step or threshold.  Trim any bushes or trees on the path to your home.  Use bright outdoor lighting.  Clear any walking paths of anything that might make someone trip, such as rocks or tools.  Regularly check to see if handrails are loose or broken. Make sure that both sides of any steps have handrails.  Any raised decks and porches should have guardrails on the edges.  Have any leaves, snow, or ice cleared regularly.  Use sand or  salt on walking paths during winter.  Clean up any spills in your garage right away. This includes oil or grease spills. What can I do in the bathroom?  Use night lights.  Install grab bars by the toilet and in the tub and shower. Do not use towel bars as grab bars.  Use non-skid mats or decals in the tub or shower.  If you need to sit down in the shower, use a plastic, non-slip stool.  Keep the floor dry. Clean up any water that spills on the floor as soon as it happens.  Remove soap buildup in the tub or shower regularly.  Attach bath mats securely with double-sided non-slip rug tape.  Do not have throw rugs and other things on the floor that can make you trip. What can I do in the bedroom?  Use night lights.  Make sure that you have a light by your bed that is easy to reach.  Do not use any sheets or blankets that are too big for your bed. They should not hang down onto the floor.  Have a firm chair that has side arms. You can use this for support while you get dressed.  Do not  have throw rugs and other things on the floor that can make you trip. What can I do in the kitchen?  Clean up any spills right away.  Avoid walking on wet floors.  Keep items that you use a lot in easy-to-reach places.  If you need to reach something above you, use a strong step stool that has a grab bar.  Keep electrical cords out of the way.  Do not use floor polish or wax that makes floors slippery. If you must use wax, use non-skid floor wax.  Do not have throw rugs and other things on the floor that can make you trip. What can I do with my stairs?  Do not leave any items on the stairs.  Make sure that there are handrails on both sides of the stairs and use them. Fix handrails that are broken or loose. Make sure that handrails are as long as the stairways.  Check any carpeting to make sure that it is firmly attached to the stairs. Fix any carpet that is loose or worn.  Avoid having  throw rugs at the top or bottom of the stairs. If you do have throw rugs, attach them to the floor with carpet tape.  Make sure that you have a light switch at the top of the stairs and the bottom of the stairs. If you do not have them, ask someone to add them for you. What else can I do to help prevent falls?  Wear shoes that:  Do not have high heels.  Have rubber bottoms.  Are comfortable and fit you well.  Are closed at the toe. Do not wear sandals.  If you use a stepladder:  Make sure that it is fully opened. Do not climb a closed stepladder.  Make sure that both sides of the stepladder are locked into place.  Ask someone to hold it for you, if possible.  Clearly mark and make sure that you can see:  Any grab bars or handrails.  First and last steps.  Where the edge of each step is.  Use tools that help you move around (mobility aids) if they are needed. These include:  Canes.  Walkers.  Scooters.  Crutches.  Turn on the lights when you go into a dark area. Replace any light bulbs as soon as they burn out.  Set up your furniture so you have a clear path. Avoid moving your furniture around.  If any of your floors are uneven, fix them.  If there are any pets around you, be aware of where they are.  Review your medicines with your doctor. Some medicines can make you feel dizzy. This can increase your chance of falling. Ask your doctor what other things that you can do to help prevent falls. This information is not intended to replace advice given to you by your health care provider. Make sure you discuss any questions you have with your health care provider. Document Released: 04/02/2009 Document Revised: 11/12/2015 Document Reviewed: 07/11/2014 Elsevier Interactive Patient Education  2017 [employer].

## 2020-04-13 NOTE — Progress Notes (Addendum)
Subjective:   George Owens is a 86 y.o. male who presents for Medicare Annual/Subsequent preventive examination.  Review of Systems    No ROS.  Medicare Wellness Virtual Visit.   Cardiac Risk Factors include: advanced age (>58men, >51 women);male gender     Objective:    Today's Vitals   04/13/20 1336  Weight: 190 lb (86.2 kg)  Height: 6\' 4"  (1.93 m)   Body mass index is 23.13 kg/m.  Advanced Directives 04/13/2020 09/29/2019 04/10/2019 04/04/2018 12/26/2017 04/03/2017 03/23/2016  Does Patient Have a Medical Advance Directive? Yes Yes Yes Yes Yes Yes Yes  Type of Industrial/product designer of [employer] Power of [place];Living will Healthcare Power of [place] of [place] of [place]  Does patient want to make changes to medical advance directive? No - Patient declined - No - Patient declined No - Patient declined No - Patient declined No - Patient declined -  Copy of [place] in Chart? No - copy requested No - copy requested No - copy requested No - copy requested Yes No - copy requested No - copy requested    Current Medications (verified) Outpatient Encounter Medications as of 04/13/2020  Medication Sig   albuterol (VENTOLIN HFA) 108 (90 Base) MCG/ACT inhaler Inhale 2 puffs into the lungs every 6 (six) hours as needed for wheezing or shortness of breath.   Calcium Carb-Cholecalciferol (CALCIUM 1000 + D PO) Take 1 tablet by mouth daily.    Cholecalciferol (VITAMIN D3) 400 UNITS CAPS Take 2 capsules by mouth daily.    Denosumab (PROLIA [place]) Inject into the skin every 6 (six) months.   glucosamine-chondroitin 500-400 MG tablet Take 1 tablet by mouth daily.   guaiFENesin-codeine (CHERATUSSIN AC) 100-10 MG/5ML syrup Take 5 mLs by mouth 3 (three) times daily as needed for cough.   lactulose (CHRONULAC) 10 GM/15ML solution TAKE 30 MLS (20 G TOTAL) BY MOUTH  2 (TWO) TIMES DAILY.   loratadine (CLARITIN) 10 MG tablet Take 10 mg by mouth daily as needed.    Multiple Vitamins-Minerals (PRESERVISION AREDS PO) Take 2 capsules by mouth daily.   naproxen (NAPROSYN) 250 MG tablet Take 250 mg by mouth as needed.   omeprazole (PRILOSEC) 40 MG capsule TAKE 1 CAPSULE (40 MG TOTAL) BY MOUTH DAILY. IN THE MORNING 30 MINUTES PRIOR TO EATING   Ranibizumab (LUCENTIS IO) Inject into the eye as needed. As needed every three months.   vitamin B-12 (CYANOCOBALAMIN) 1000 MCG tablet Take 1,000 mcg by mouth daily.   [DISCONTINUED] azithromycin (ZITHROMAX) 250 MG tablet Take 1 tablet (250 mg total) by mouth daily. Take first 2 tablets together, then 1 every day until finished.   No facility-administered encounter medications on file as of 04/13/2020.    Allergies (verified) Kenalog [triamcinolone acetonide]   History: Past Medical History:  Diagnosis Date   Allergy    Basal cell carcinoma    right temple Dr. Evorn Gong 12/2018   Chicken pox    Colon polyps    GERD (gastroesophageal reflux disease)    history of prostate CA 2006   Hyperlipidemia    Leg cramps    Neuropathy    Osteoporosis    Past Surgical History:  Procedure Laterality Date   CATARACT EXTRACTION Right    CATARACT EXTRACTION W/PHACO Left 12/26/2017   Procedure: CATARACT EXTRACTION PHACO AND INTRAOCULAR LENS PLACEMENT ([place])  COMPLICATED LEFT;  Surgeon: Leandrew Koyanagi, MD;  Location: [facility];  Service: Ophthalmology;  Laterality: Left;  [place]   colonoscopy     EYE MUSCLE SURGERY Left    TONSILLECTOMY AND ADENOIDECTOMY     Family History  Problem Relation Age of Onset   Stroke Mother    Arthritis Father    Heart disease Father    Brain cancer Brother        1/2 brother (possibly related to mother NOT related to patient)   Colon cancer Neg Hx    Esophageal cancer Neg Hx    Pancreatic cancer Neg Hx    Stomach cancer Neg Hx    Liver disease Neg Hx    Social  History   Socioeconomic History   Marital status: Married    Spouse name: Eloise   Number of children: 4   Years of education: Not on file   Highest education level: Not on file  Occupational History   Occupation: retired  Tobacco Use   Smoking status: Never Smoker   Smokeless tobacco: Never Used  Scientific laboratory technician Use: Never used  Substance and Sexual Activity   Alcohol use: No   Drug use: No   Sexual activity: Yes    Partners: Female  Other Topics Concern   Not on file  Social History Narrative   Lives with wife in a 5 story retirement home.  He lives on the first floor.  4 year college degree.  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     Social Determinants of Health   Financial Resource Strain: Low Risk    Difficulty of Paying Living Expenses: Not hard at all  Food Insecurity:    Worried About Charity fundraiser in the Last Year: Not on file   [employer] of Food in the Last Year: Not on file  Transportation Needs: No Transportation Needs   Lack of Transportation (Medical): No   Lack of Transportation (Non-Medical): No  Physical Activity:    Days of Exercise per Week: Not on file   Minutes of Exercise per Session: Not on file  Stress: No Stress Concern Present   Feeling of Stress : Not at all  Social Connections: Unknown   Frequency of Communication with Friends and Family: Not on file   Frequency of Social Gatherings with Friends and Family: Not on file   Attends Religious Services: Not on Electrical engineer or Organizations: Not on file   Attends Archivist Meetings: Not on file   Marital Status: Married    Tobacco Counseling Counseling given: Not Answered   Clinical Intake:  Pre-visit preparation completed: Yes        Diabetes: No  How often do you need to have someone help you when you read instructions, pamphlets, or other written materials from your doctor or pharmacy?: 1 - Never  Interpreter  Needed?: No      Activities of Daily Living In your present state of health, do you have any difficulty performing the following activities: 04/13/2020  Hearing? N  Vision? N  Difficulty concentrating or making decisions? N  Walking or climbing stairs? N  Comment Paces self. Avoids stairs.  Dressing or bathing? N  Doing errands, shopping? Y  Comment He does not Physiological scientist and eating ? N  Using the Toilet? N  In the past six months, have you accidently leaked urine? N  Do you have problems with loss of bowel  control? N  Managing your Medications? N  Managing your Finances? N  Housekeeping or managing your Housekeeping? N  Some recent data might be hidden    Patient Care Team: Crecencio Mc, MD as PCP - General (Internal Medicine)  Indicate any recent Medical Services you may have received from other than [facility] providers in the past year (date may be approximate).     Assessment:   This is a routine wellness examination for Davion.  I connected with Bernardo today by telephone and verified that I am speaking with the correct person using two identifiers. Location patient: home Location provider: work Persons participating in the virtual visit: patient, Marine scientist.    I discussed the limitations, risks, security and privacy concerns of performing an evaluation and management service by telephone and the availability of in person appointments. The patient expressed understanding and verbally consented to this telephonic visit.    Interactive audio and video telecommunications were attempted between this provider and patient, however failed, due to patient having technical difficulties OR patient did not have access to video capability.  We continued and completed visit with audio only.  Some vital signs may be absent or patient reported.   Hearing/Vision screen  Hearing Screening   125Hz  250Hz  500Hz  1000Hz  2000Hz  3000Hz  4000Hz  6000Hz  8000Hz   Right ear:           Left  ear:           Comments: Patient is able to hear conversational tones without difficulty. No issues reported.  Vision Screening Comments: Wears corrective lenses  Visual acuity not assessed, virtual visit.  Macular degeneration; injections every 12 weeks  Dietary issues and exercise activities discussed: Current Exercise Habits: Home exercise routine, Type of exercise: walking, Intensity: Mild Healthy diet Good water intake  Goals      Follow up with Primary Care Provider       Depression Screen [facility] 2/9 Scores 04/13/2020 04/10/2019 04/04/2018 04/03/2017 11/01/2016 03/23/2016 03/19/2014  PHQ - 2 Score 0 0 0 0 0 0 0  PHQ- 9 Score - - - 0 - - -    Fall Risk Fall Risk  04/13/2020 03/26/2020 10/01/2019 04/11/2019 04/10/2019  Falls in the past year? 0 0 1 0 0  Number falls in past yr: 0 - 0 - 0  Injury with Fall? - - 1 - -  Follow up Falls evaluation completed Falls evaluation completed Falls evaluation completed Falls evaluation completed -   Handrails in use when climbing stairs? Yes Home free of loose throw rugs in walkways, pet beds, electrical cords, etc? Yes  Adequate lighting in your home to reduce risk of falls? Yes   ASSISTIVE DEVICES UTILIZED TO PREVENT FALLS: Use of a cane, walker or w/c? No   TIMED UP AND GO: Was the test performed? No . Virtual visit.   Cognitive Function: Patient is alert and oriented x3.  Denies difficulty focusing, making decisions, memory loss.  Enjoys audio books.   MMSE - Mini Mental State Exam 03/23/2016  Orientation to time 5  Orientation to Place 5  Registration 3  Attention/ Calculation 5  Recall 3  Language- name 2 objects 2  Language- repeat 1  Language- follow 3 step command 3  Language- read & follow direction 1  Write a sentence 1  Copy design 1  Total score 30     6CIT Screen 04/10/2019 04/04/2018 04/03/2017  What Year? 0 points 0 points 0 points  What month? 0 points 0 points 0  points  What time? 0 points 0 points 0  points  Count back from 20 0 points 0 points 0 points  Months in reverse 0 points 0 points 0 points  Repeat phrase - 0 points 0 points  Total Score - 0 0    Immunizations Immunization History  Administered Date(s) Administered   Influenza Split 04/18/2013   Influenza, High Dose Seasonal PF 03/30/2016   Influenza-Unspecified 03/06/2017, 03/21/2018, 03/28/2019   PFIZER SARS-COV-2 Vaccination 06/25/2019, 07/24/2019   Pneumococcal Conjugate-13 10/04/2013   Pneumococcal Polysaccharide-23 02/28/2012   Tdap 04/30/2013   Zoster 11/24/2010   Zoster Recombinat (Shingrix) 05/25/2018, 08/06/2018, 02/25/2019    Health Maintenance There are no preventive care reminders to display for this patient. Health Maintenance  Topic Date Due   INFLUENZA VACCINE  09/17/2020 (Originally 01/19/2020)   TETANUS/TDAP  05/01/2023   COVID-19 Vaccine  Completed   PNA vac Low Risk Adult  Completed    Dental Screening: Recommended annual dental exams for proper oral hygiene.  Community Resource Referral / Chronic Care Management: CRR required this visit?  No   CCM required this visit?  No      Plan:   Keep all routine maintenance appointments.   Follow up 04/14/20 @ 3:30  Follow up 04/27/20 @ 10:30  Nurse notes: Patient would like pcp to know he is still having minimal BM, pencil like stool with constipation. Notices hot flashes over the last several days.  Uses warm salt water to help keep throat clear of drainage and claritan on some days that are worse than others.   I have personally reviewed and noted the following in the patient's chart:   Medical and social history Use of alcohol, tobacco or illicit drugs  Current medications and supplements Functional ability and status Nutritional status Physical activity Advanced directives List of other physicians Hospitalizations, surgeries, and ER visits in previous 12 months Vitals Screenings to include cognitive, depression, and falls Referrals  and appointments  In addition, I have reviewed and discussed with patient certain preventive protocols, quality metrics, and best practice recommendations. A written personalized care plan for preventive services as well as general preventive health recommendations were provided to patient via mychart.     OBrien-Blaney, Reannon Candella L, LPN   [id number]     I have reviewed the above information and agree with above.   Deborra Medina, MD

## 2020-04-14 ENCOUNTER — Other Ambulatory Visit: Payer: Self-pay

## 2020-04-14 ENCOUNTER — Ambulatory Visit (INDEPENDENT_AMBULATORY_CARE_PROVIDER_SITE_OTHER): Payer: PPO | Admitting: Internal Medicine

## 2020-04-14 ENCOUNTER — Encounter: Payer: Self-pay | Admitting: Internal Medicine

## 2020-04-14 VITALS — BP 122/72 | HR 76 | Temp 97.8°F | Resp 15 | Ht 76.0 in | Wt 185.8 lb

## 2020-04-14 DIAGNOSIS — M818 Other osteoporosis without current pathological fracture: Secondary | ICD-10-CM

## 2020-04-14 DIAGNOSIS — Z0000 Encounter for general adult medical examination without abnormal findings: Secondary | ICD-10-CM | POA: Diagnosis not present

## 2020-04-14 DIAGNOSIS — F418 Other specified anxiety disorders: Secondary | ICD-10-CM | POA: Diagnosis not present

## 2020-04-14 DIAGNOSIS — J189 Pneumonia, unspecified organism: Secondary | ICD-10-CM | POA: Diagnosis not present

## 2020-04-14 DIAGNOSIS — I700 Atherosclerosis of aorta: Secondary | ICD-10-CM | POA: Diagnosis not present

## 2020-04-14 DIAGNOSIS — R351 Nocturia: Secondary | ICD-10-CM | POA: Diagnosis not present

## 2020-04-14 DIAGNOSIS — Z23 Encounter for immunization: Secondary | ICD-10-CM | POA: Diagnosis not present

## 2020-04-14 DIAGNOSIS — R4589 Other symptoms and signs involving emotional state: Secondary | ICD-10-CM

## 2020-04-14 DIAGNOSIS — G629 Polyneuropathy, unspecified: Secondary | ICD-10-CM

## 2020-04-14 MED ORDER — SERTRALINE HCL 50 MG PO TABS: 50.0000 mg | ORAL_TABLET | Freq: Every day | ORAL | 3 refills | Status: DC

## 2020-04-14 NOTE — Assessment & Plan Note (Signed)
Chronic, unchanged in several years,  Not keeping him awake at night  No further workup

## 2020-04-14 NOTE — Assessment & Plan Note (Signed)
T scores have been stable since  2015,  Per 2019 last DEXA . Receiving Prolia

## 2020-04-14 NOTE — Progress Notes (Signed)
Patient ID: George Owens, male    DOB: 06-03-1932  Age: 86 y.o. MRN: [id number]  The patient is here for annual preventive  examination and management of other chronic and acute problems.  This visit occurred during the SARS-CoV-2 public health emergency.  Safety protocols were in place, including screening questions prior to the visit, additional usage of staff PPE, and extensive cleaning of exam room while observing appropriate contact time as indicated for disinfecting solutions.      The risk factors are reflected in the social history.  The roster of all physicians providing medical care to patient - is listed in the Snapshot section of the chart.  Activities of daily living:  The patient is 100% independent in all ADLs: dressing, toileting, feeding as well as independent mobility  Home safety : The patient has smoke detectors in the home. They wear seatbelts.  There are no firearms at home. There is no violence in the home.   There is no risks for hepatitis, STDs or HIV. There is no   history of blood transfusion. They have no travel history to infectious disease endemic areas of the world.  The patient has seen their dentist in the last six month. They have seen their eye doctor in the last year. They admit to slight hearing difficulty with regard to whispered voices and some television programs.  They have deferred audiologic testing in the last year.  They do not  have excessive sun exposure. Discussed the need for sun protection: hats, long sleeves and use of sunscreen if there is significant sun exposure.   Diet: the importance of a healthy diet is discussed. They do have a healthy diet.  The benefits of regular aerobic exercise were discussed. She walks 4 times per week ,  20 minutes.   Depression screen: there are no signs or vegative symptoms of depression- irritability, change in appetite, anhedonia, sadness/tearfullness.  Cognitive assessment: the patient manages all their  financial and personal affairs and is actively engaged. They could relate day,date,year and events; recalled 2/3 objects at 3 minutes; performed clock-face test normally.  The following portions of the patient's history were reviewed and updated as appropriate: allergies, current medications, past family history, past medical history,  past surgical history, past social history  and problem list.  Visual acuity was not assessed per patient preference since she has regular follow up with her ophthalmologist. Hearing and body mass index were assessed and reviewed.   During the course of the visit the patient was educated and counseled about appropriate screening and preventive services including : fall prevention , diabetes screening, nutrition counseling, colorectal cancer screening, and recommended immunizations.    CC: The primary encounter diagnosis was Need for immunization against influenza. Diagnoses of Other osteoporosis without current pathological fracture, Thoracic aortic atherosclerosis ([place]), Need for 23-polyvalent pneumococcal polysaccharide vaccine, Anxiety about health, Pneumonia of left lower lobe due to infectious organism, Neuropathy, Nocturia, and Visit for preventive health examination were also pertinent to this visit.   1) Post COVID Fatigue. Some days "weak as water" appetite poor   2) Osteoporosis follow up last dose Prolia May 2021   3) GI:  Had colonoscopy April 21 still having  Regularity issues . Bothered by having frequent small soft stools . No diarrhea    4) anxiety/insomnia:  Finally slept 5 hours last night with melatonin.  Having to void every 2 hours .  Dry mouth  Drinks in excess of 24 ounces of water before bed  and more after each void due to dry mouth   5) Feet feel hot at night.  Not keeping him up at night.   6) bothered by rib pain when he twists   [name] Owens has a past medical history of Allergy, Basal cell carcinoma, Chicken pox, Colon polyps, GERD  (gastroesophageal reflux disease), history of prostate CA (2006), Hyperlipidemia, Leg cramps, Neuropathy, and Osteoporosis.   He has a past surgical history that includes Cataract extraction w/PHACO (Left, 12/26/2017); Tonsillectomy and adenoidectomy; Cataract extraction (Right); Eye muscle surgery (Left); and colonoscopy.   His family history includes Arthritis in his father; Brain cancer in his brother; Heart disease in his father; Stroke in his mother.He reports that he has never smoked. He has never used smokeless tobacco. He reports that he does not drink alcohol and does not use drugs.  Outpatient Medications Prior to Visit  Medication Sig Dispense Refill  . Calcium Carb-Cholecalciferol (CALCIUM 1000 + D PO) Take 1 tablet by mouth daily.     . Cholecalciferol (VITAMIN D3) 400 UNITS CAPS Take 2 capsules by mouth daily.     . Denosumab (PROLIA [place]) Inject into the skin every 6 (six) months.    [name] glucosamine-chondroitin 500-400 MG tablet Take 1 tablet by mouth daily.    [name] lactulose (CHRONULAC) 10 GM/15ML solution TAKE 30 MLS (20 G TOTAL) BY MOUTH 2 (TWO) TIMES DAILY. 1892 mL 3  . loratadine (CLARITIN) 10 MG tablet Take 10 mg by mouth daily as needed.     . Multiple Vitamins-Minerals (PRESERVISION AREDS PO) Take 2 capsules by mouth daily.    . naproxen (NAPROSYN) 250 MG tablet Take 250 mg by mouth as needed.    [name] omeprazole (PRILOSEC) 40 MG capsule TAKE 1 CAPSULE (40 MG TOTAL) BY MOUTH DAILY. IN THE MORNING 30 MINUTES PRIOR TO EATING 90 capsule 3  . Ranibizumab (LUCENTIS IO) Inject into the eye as needed. As needed every three months.    . vitamin B-12 (CYANOCOBALAMIN) 1000 MCG tablet Take 1,000 mcg by mouth daily.    [name] albuterol (VENTOLIN HFA) 108 (90 Base) MCG/ACT inhaler Inhale 2 puffs into the lungs every 6 (six) hours as needed for wheezing or shortness of breath. (Patient not taking: Reported on 04/14/2020) 8 g 0  . guaiFENesin-codeine (CHERATUSSIN AC) 100-10 MG/5ML syrup Take 5 mLs by mouth  3 (three) times daily as needed for cough. (Patient not taking: Reported on 04/14/2020) 150 mL 0   No facility-administered medications prior to visit.    Review of Systems   Patient denies headache, fevers, malaise, unintentional weight loss, skin rash, eye pain, sinus congestion and sinus pain, sore throat, dysphagia,  hemoptysis , cough, dyspnea, wheezing, chest pain, palpitations, orthopnea, edema, abdominal pain, nausea, melena, diarrhea, constipation, flank pain, dysuria, hematuria, urinary  Frequency, nocturia, numbness, tingling, seizures,  Focal weakness, Loss of consciousness,  Tremor, insomnia, depression, anxiety, and suicidal ideation.      Objective:  BP 122/72 (BP Location: Left Arm, Patient Position: Sitting, Cuff Size: Normal)   Pulse 76   Temp 97.8 F (36.6 C) (Oral)   Resp 15   Ht 6\' 4"  (1.93 m)   Wt 185 lb 12.8 oz (84.3 kg)   SpO2 98%   BMI 22.62 kg/m   Physical Exam  General appearance: alert, cooperative and appears stated age Ears: normal TM's and external ear canals both ears Throat: lips, mucosa, and tongue normal; teeth and gums normal Neck: no adenopathy, no carotid bruit, supple, symmetrical, trachea midline and  thyroid not enlarged, symmetric, no tenderness/mass/nodules Back: symmetric, no curvature. ROM normal. No CVA tenderness. Lungs: clear to auscultation bilaterally Heart: regular rate and rhythm, S1, S2 normal, no murmur, click, rub or gallop Abdomen: soft, non-tender; bowel sounds normal; no masses,  no organomegaly Pulses: 2+ and symmetric Skin: Skin color, texture, turgor normal. No rashes or lesions Lymph nodes: Cervical, supraclavicular, and axillary nodes normal.  Assessment & Plan:   Problem List Items Addressed This Visit      Unprioritized   Visit for preventive health examination    age appropriate education and counseling updated, referrals for preventative services and immunizations addressed, dietary and smoking counseling  addressed, most recent labs reviewed.  I have personally reviewed and have noted:  1) the patient's medical and social history 2) The pt's use of alcohol, tobacco, and illicit drugs 3) The patient's current medications and supplements 4) Functional ability including ADL's, fall risk, home safety risk, hearing and visual impairment 5) Diet and physical activities 6) Evidence for depression or mood disorder 7) The patient's height, weight, and BMI have been recorded in the chart  I have made referrals, and provided counseling and education based on review of the above      Osteoporosis    T scores have been stable since  2015,  Per 2019 last DEXA . Receiving Prolia       Neuropathy    Chronic, unchanged in several years,  Not keeping him awake at night  No further workup       LLL pneumonia    Lungs are clear and symptoms have resolved.  Repeat chest x ray in mid November.       Thoracic aortic atherosclerosis (H[facility])    Aortic atherosclerosis :  Discussed role  for statin therapy given documented evidence of moderate  atherosclerosis in the aorta noted on recent  CT of abdomen and  pelvis and the prognostic implications of this finding. He has a history of severe statin induced myalgias        Anxiety about health    He remains very anxious and pessimistic about aspects of his health that are trivial and is unable to appreciate how healthy he is. .   Starting sertraline at 25 mg daily for one week,  Then 50 mg daily thereafter       Relevant Medications   sertraline (ZOLOFT) 50 MG tablet   Nocturia    Excessive swater intake at night likely contributing .        Other Visit Diagnoses    Need for immunization against influenza    -  Primary   Relevant Orders   Flu Vaccine QUAD High Dose(Fluad) (Completed)   Need for 23-polyvalent pneumococcal polysaccharide vaccine       Relevant Orders   Pneumococcal polysaccharide vaccine 23-valent greater than or equal to 2yo  subcutaneous/IM (Completed)      I am having George Owens start on sertraline. I am also having him maintain his loratadine, Calcium Carb-Cholecalciferol (CALCIUM 1000 + D PO), Vitamin D3, Multiple Vitamins-Minerals (PRESERVISION AREDS PO), naproxen, Denosumab (PROLIA [place]), Ranibizumab (LUCENTIS IO), vitamin B-12, glucosamine-chondroitin, lactulose, omeprazole, albuterol, and Cheratussin AC.  Meds ordered this encounter  Medications  . sertraline (ZOLOFT) 50 MG tablet    Sig: Take 1 tablet (50 mg total) by mouth daily.    Dispense:  30 tablet    Refill:  3    There are no discontinued medications.  Follow-up: No follow-ups on file.  Crecencio Mc, MD

## 2020-04-14 NOTE — Assessment & Plan Note (Addendum)
He remains very anxious and pessimistic about aspects of his health that are trivial and is unable to appreciate how healthy he is. .   Starting sertraline at 25 mg daily for one week,  Then 50 mg daily thereafter

## 2020-04-14 NOTE — Assessment & Plan Note (Signed)

## 2020-04-14 NOTE — Assessment & Plan Note (Signed)
Aortic atherosclerosis :  Discussed role  for statin therapy given documented evidence of moderate  atherosclerosis in the aorta noted on recent  CT of abdomen and  pelvis and the prognostic implications of this finding. He has a history of severe statin induced myalgias

## 2020-04-14 NOTE — Assessment & Plan Note (Addendum)
Lungs are clear and symptoms have resolved.  Repeat chest x ray in mid November.

## 2020-04-14 NOTE — Patient Instructions (Addendum)
Your anxiety  Is out of control. It's affecting your ability to rationally assess your progress and enjoy life!  I want you try a medication called an SSRI.  It is not addicting,  And over time will reduce your anxiety and your tendency to see everything in a negative light.    Please start the sertraline with a decent sized lunch.  Start with 1/2 tablet daily for the first week ,  To avoid nausea.  If you are tolerating it ,After one week increase to a full tablet with meal.   You are no longer contagious!  Flu and Pneumonia vaccines today   Consider a chin strap to keep your mouth closed when you sleep,  You cannot control this when you are asleep

## 2020-04-14 NOTE — Assessment & Plan Note (Signed)
Excessive swater intake at night likely contributing .

## 2020-04-16 ENCOUNTER — Telehealth: Payer: Self-pay | Admitting: Internal Medicine

## 2020-04-16 NOTE — Telephone Encounter (Signed)
Patient is having very small bowel movements, in 24 hours he has had 7 to 8 bowel movements, very small. Patient took 2 stool softeners since Monday. What should he do?

## 2020-04-16 NOTE — Telephone Encounter (Signed)
THIS IS NOT ANYTHING WORRISOME.  TELL HIM NOT TO WORRY.   HAVE HIM TAKE A DOSE OF LACTULOSE .  SHOULD TAKE A STOOL SOFTENER AND MIRALAX DAILY AT NIGHT  GOING FORWARD

## 2020-04-16 NOTE — Telephone Encounter (Signed)
Spoke with pt to let him know that he needs to take a dose of lactulose not and then start taking miralax and a stool softener daily at night. Pt gave a verbal understanding.

## 2020-04-25 ENCOUNTER — Ambulatory Visit
Admission: RE | Admit: 2020-04-25 | Discharge: 2020-04-25 | Disposition: A | Discharge status: Home / Self Care | Payer: PPO | Source: Ambulatory Visit | Attending: Internal Medicine | Admitting: Internal Medicine

## 2020-04-25 ENCOUNTER — Ambulatory Visit
Admission: RE | Admit: 2020-04-25 | Discharge: 2020-04-25 | Disposition: A | Discharge status: Home / Self Care | Payer: PPO | Attending: Internal Medicine | Admitting: Internal Medicine

## 2020-04-25 DIAGNOSIS — J189 Pneumonia, unspecified organism: Secondary | ICD-10-CM | POA: Insufficient documentation

## 2020-04-25 DIAGNOSIS — J90 Pleural effusion, not elsewhere classified: Secondary | ICD-10-CM | POA: Diagnosis not present

## 2020-04-27 ENCOUNTER — Encounter: Payer: Self-pay | Admitting: Internal Medicine

## 2020-04-27 ENCOUNTER — Ambulatory Visit (INDEPENDENT_AMBULATORY_CARE_PROVIDER_SITE_OTHER): Payer: PPO | Admitting: Internal Medicine

## 2020-04-27 ENCOUNTER — Other Ambulatory Visit: Payer: Self-pay

## 2020-04-27 VITALS — BP 134/70 | HR 82 | Temp 98.3°F | Resp 15 | Ht 76.0 in | Wt 184.4 lb

## 2020-04-27 DIAGNOSIS — R9389 Abnormal findings on diagnostic imaging of other specified body structures: Secondary | ICD-10-CM

## 2020-04-27 DIAGNOSIS — R252 Cramp and spasm: Secondary | ICD-10-CM

## 2020-04-27 DIAGNOSIS — R195 Other fecal abnormalities: Secondary | ICD-10-CM

## 2020-04-27 DIAGNOSIS — R634 Abnormal weight loss: Secondary | ICD-10-CM

## 2020-04-27 DIAGNOSIS — M810 Age-related osteoporosis without current pathological fracture: Secondary | ICD-10-CM

## 2020-04-27 DIAGNOSIS — J189 Pneumonia, unspecified organism: Secondary | ICD-10-CM

## 2020-04-27 DIAGNOSIS — F418 Other specified anxiety disorders: Secondary | ICD-10-CM | POA: Diagnosis not present

## 2020-04-27 DIAGNOSIS — K5900 Constipation, unspecified: Secondary | ICD-10-CM | POA: Diagnosis not present

## 2020-04-27 DIAGNOSIS — R253 Fasciculation: Secondary | ICD-10-CM

## 2020-04-27 DIAGNOSIS — R198 Other specified symptoms and signs involving the digestive system and abdomen: Secondary | ICD-10-CM | POA: Diagnosis not present

## 2020-04-27 DIAGNOSIS — R0683 Snoring: Secondary | ICD-10-CM | POA: Diagnosis not present

## 2020-04-27 DIAGNOSIS — Z8546 Personal history of malignant neoplasm of prostate: Secondary | ICD-10-CM

## 2020-04-27 DIAGNOSIS — I700 Atherosclerosis of aorta: Secondary | ICD-10-CM

## 2020-04-27 DIAGNOSIS — Z79818 Long term (current) use of other agents affecting estrogen receptors and estrogen levels: Secondary | ICD-10-CM

## 2020-04-27 LAB — BASIC METABOLIC PANEL
BUN: 20 mg/dL (ref 6–23)
CO2: 32 mEq/L (ref 19–32)
Calcium: 9 mg/dL (ref 8.4–10.5)
Chloride: 99 mEq/L (ref 96–112)
Creatinine, Ser: 0.84 mg/dL (ref 0.40–1.50)
GFR: 79.26 mL/min (ref >60.00)
Glucose, Bld: 95 mg/dL (ref 70–99)
Potassium: 4 mEq/L (ref 3.5–5.1)
Sodium: 138 mEq/L (ref 135–145)

## 2020-04-27 LAB — MAGNESIUM: Magnesium: 2 mg/dL (ref 1.5–2.5)

## 2020-04-27 LAB — CK: Total CK: 78 U/L (ref 7–232)

## 2020-04-27 LAB — PSA: PSA: 0.51 ng/mL (ref 0.10–4.00)

## 2020-04-27 LAB — TSH: TSH: 2.26 u[IU]/mL (ref 0.35–4.50)

## 2020-04-27 MED ORDER — DENOSUMAB 60 MG/ML ~~LOC~~ SOSY
60.0000 mg | PREFILLED_SYRINGE | Freq: Once | SUBCUTANEOUS | Status: AC
  Administered 2020-04-27: 60 mg via SUBCUTANEOUS

## 2020-04-27 NOTE — Progress Notes (Signed)
Subjective:  Patient ID: George Owens, male    DOB: June 22, 1931  Age: 86 y.o. MRN: [id number]  CC: The primary encounter diagnosis was Unintentional weight loss. Diagnoses of Use of leuprolide acetate (Lupron), History of prostate cancer, Muscle twitch, Change in stool caliber, Abnormal chest x-ray, Snoring, Nocturnal muscle cramps, Constipation, unspecified constipation type, Osteoporosis, unspecified osteoporosis type, unspecified pathological fracture presence, Anxiety about health, Change in bowel function, Pneumonia of left lower lobe due to infectious organism, Muscle cramps, and Thoracic aortic atherosclerosis ([place]) were also pertinent to this visit.  HPI George Owens presents for follow up on  multiple issues that have been ongoing for the past 1-2 months.  This visit occurred during the SARS-CoV-2 public health emergency.  Safety protocols were in place, including screening questions prior to the visit, additional usage of staff PPE, and extensive cleaning of exam room while observing appropriate contact time as indicated for disinfecting solutions.    Patient has received both doses of the available COVID 19 vaccine without complications.  Patient continues to mask when outside of the home except when walking in yard or at safe distances from others .  Patient denies any change in mood or development of unhealthy behaviors resuting from the pandemic's restriction of activities and socialization.    1) post COVID pneumonia.  He was treated empirically with abx for a LLL infiltrate and persistent cough.  The cough has resolved.  Repeat chest x ray was done at 4 weeks,  but not resulted at time of visit.  Based on my review, the LLL area continued to look suspicious.  He has continued to lose weight despite increasing his protein intake. He is extremely worried about missing something more sinister.     2) He continues to have periodic Nocturnal leg   Cramps.   Not ocurring every night.   Has tried home remedies including a TBSP  mustard,  An ounce of Pickle juice,  Both tried as abortive therapy),  As well as several days in a row of Tonic water.     3) Anxiety about health:  He was started on 25 mg sertraline 10 days ago,  And is currently tolerating full dose (50 mg ) since last Wednesday .    4) Elevated bp for him: bp "as high as it's every been" bc worried about the chest x ray   5)  Chronic constipation/altered stool habits.  He has been taking  A stool softener and miralax daily .  Having small caliber soft stools several times daily , every time he urinates. He has reduced the amount of stool softener ,  But continues to report that he has not had a normal caliber stool in over a month.  He would like to  see GI again, but does not want to see an NP  6) throat feels sore early in the morning, then improves, then recurs late in the day. The discomfort radiates to his  ears.   Denies  Having a lot of mucus and PND.  Using salt water gargles and claritin.  Prior adverse reaction to Benadryl.    6) he continues to report intermittent random Muscle twitches and muscle cramping at night.    Discussed ruling out osa with a home sleep study  , CT chest to rule out LLL mass,  And GI referral to rule out rectal mass.     Outpatient Medications Prior to Visit  Medication Sig Dispense Refill  . Calcium Carb-Cholecalciferol (  CALCIUM 1000 + D PO) Take 1 tablet by mouth daily.     . Cholecalciferol (VITAMIN D3) 400 UNITS CAPS Take 2 capsules by mouth daily.     . Denosumab (PROLIA [place]) Inject into the skin every 6 (six) months.    [name] glucosamine-chondroitin 500-400 MG tablet Take 1 tablet by mouth daily.    [name] lactulose (CHRONULAC) 10 GM/15ML solution TAKE 30 MLS (20 G TOTAL) BY MOUTH 2 (TWO) TIMES DAILY. 1892 mL 3  . loratadine (CLARITIN) 10 MG tablet Take 10 mg by mouth daily as needed.     . Multiple Vitamins-Minerals (PRESERVISION AREDS PO) Take 2 capsules by mouth daily.    .  naproxen (NAPROSYN) 250 MG tablet Take 250 mg by mouth as needed.    [name] omeprazole (PRILOSEC) 40 MG capsule TAKE 1 CAPSULE (40 MG TOTAL) BY MOUTH DAILY. IN THE MORNING 30 MINUTES PRIOR TO EATING 90 capsule 3  . Ranibizumab (LUCENTIS IO) Inject into the eye as needed. As needed every three months.    . sertraline (ZOLOFT) 50 MG tablet Take 1 tablet (50 mg total) by mouth daily. 30 tablet 3  . vitamin B-12 (CYANOCOBALAMIN) 1000 MCG tablet Take 1,000 mcg by mouth daily.    [name] albuterol (VENTOLIN HFA) 108 (90 Base) MCG/ACT inhaler Inhale 2 puffs into the lungs every 6 (six) hours as needed for wheezing or shortness of breath. (Patient not taking: Reported on 04/14/2020) 8 g 0  . guaiFENesin-codeine (CHERATUSSIN AC) 100-10 MG/5ML syrup Take 5 mLs by mouth 3 (three) times daily as needed for cough. (Patient not taking: Reported on 04/14/2020) 150 mL 0   No facility-administered medications prior to visit.    Review of Systems;  Patient denies headache, fevers, malaise, unintentional weight loss, skin rash, eye pain, sinus congestion and sinus pain, sore throat, dysphagia,  hemoptysis , cough, dyspnea, wheezing, chest pain, palpitations, orthopnea, edema, abdominal pain, nausea, melena, diarrhea, constipation, flank pain, dysuria, hematuria, urinary  Frequency, nocturia, numbness, tingling, seizures,  Focal weakness, Loss of consciousness,  Tremor, insomnia, depression, anxiety, and suicidal ideation.      Objective:  BP 134/70 (BP Location: Left Arm, Patient Position: Sitting, Cuff Size: Normal)   Pulse 82   Temp 98.3 F (36.8 C) (Oral)   Resp 15   Ht 6\' 4"  (1.93 m)   Wt 184 lb 6.4 oz (83.6 kg)   SpO2 97%   BMI 22.45 kg/m   BP Readings from Last 3 Encounters:  04/27/20 134/70  04/14/20 122/72  03/30/20 128/77    Wt Readings from Last 3 Encounters:  04/27/20 184 lb 6.4 oz (83.6 kg)  04/14/20 185 lb 12.8 oz (84.3 kg)  04/13/20 190 lb (86.2 kg)    General appearance: alert, cooperative  and appears stated age Ears: normal TM's and external ear canals both ears Throat: lips, mucosa, and tongue normal; teeth and gums normal Neck: no adenopathy, no carotid bruit, supple, symmetrical, trachea midline and thyroid not enlarged, symmetric, no tenderness/mass/nodules Back: symmetric, no curvature. ROM normal. No CVA tenderness. Lungs: clear to auscultation bilaterally Heart: regular rate and rhythm, S1, S2 normal, no murmur, click, rub or gallop Abdomen: soft, non-tender; bowel sounds normal; no masses,  no organomegaly Pulses: 2+ and symmetric Skin: Skin color, texture, turgor normal. No rashes or lesions Lymph nodes: Cervical, supraclavicular, and axillary nodes normal.  No results found for: HGBA1C  Lab Results  Component Value Date   CREATININE 0.84 04/27/2020   CREATININE 1.02 04/07/2020   CREATININE 0.94  10/01/2019    Lab Results  Component Value Date   WBC 5.1 10/01/2019   HGB 13.7 10/01/2019   HCT 40.0 10/01/2019   PLT 142.0 (L) 10/01/2019   GLUCOSE 95 04/27/2020   CHOL 198 04/19/2019   TRIG 171 (H) 04/19/2019   HDL 41 04/19/2019   LDLDIRECT 129.0 03/31/2017   LDLCALC 128 (H) 04/19/2019   ALT 21 04/19/2019   AST 23 04/19/2019   NA 138 04/27/2020   K 4.0 04/27/2020   CL 99 04/27/2020   CREATININE 0.84 04/27/2020   BUN 20 04/27/2020   CO2 32 04/27/2020   TSH 2.26 04/27/2020   PSA 0.51 04/27/2020    No results found.  Assessment & Plan:   Problem List Items Addressed This Visit      Unprioritized   Anxiety about health    Continue 50 mg sertraline.  All concerns listed today have been addressed       Change in bowel function    No combination of laxatives has resulted in a return to normal.  Back to GI for second opinion.       History of prostate cancer    PSA remains detectable by low and stable   Lab Results  Component Value Date   PSA 0.51 04/27/2020   PSA 0.6 04/19/2019   PSA 0.54 04/04/2018          Relevant Orders   PSA  (Completed)   LLL pneumonia    The LLL  Opacity is opined to be caused by rib overlap,  But given his life altering anxiety,  Unintentional weight loss   I have ordered CT chest w/o contrast to rule out mass.       Muscle cramps    Electrolytes, thyroid and muscle enzyme are all normal.   Lab Results  Component Value Date   TSH 2.26 04/27/2020   Lab Results  Component Value Date   CKTOTAL 78 04/27/2020   Lab Results  Component Value Date   NA 138 04/27/2020   K 4.0 04/27/2020   CL 99 04/27/2020   CO2 32 04/27/2020         Osteoporosis    Continue Prolia,  Injection given today       Snoring    I suspect that his muscle twitches,  Sore throat may be due to snoring,  OSA with hypoxia.  Home sleep study ordered       Relevant Orders   Home sleep test   Thoracic aortic atherosclerosis ([place])     Discussed role  for statin therapy given documented evidence of moderate  atherosclerosis in the aorta noted on recent  CT of abdomen and  pelvis and the prognostic implications of this finding. His atherosclerosis remains untreated,  As he has has a history of severe statin induced myalgias        Use of leuprolide acetate (Lupron)    Placed for treatment of prostate CA        Other Visit Diagnoses    Unintentional weight loss    -  Primary   Relevant Orders   TSH (Completed)   Ambulatory referral to Gastroenterology   CT Chest Wo Contrast   Muscle twitch       Relevant Orders   Magnesium (Completed)   Basic metabolic panel (Completed)   CK (Creatine Kinase) (Completed)   Home sleep test   Change in stool caliber       Relevant Orders   Ambulatory referral  to Gastroenterology   Abnormal chest x-ray       Relevant Orders   CT Chest Wo Contrast   Nocturnal muscle cramps       Relevant Orders   Home sleep test   Constipation, unspecified constipation type         A total of 40 minutes was spent with patient more than half of which was spent in counseling patient  on the above mentioned issues , reviewing and explaining recent labs and imaging studies done, and coordination of care.  I have discontinued Keona A. Castrellon's Cheratussin AC. I am also having him maintain his loratadine, Calcium Carb-Cholecalciferol (CALCIUM 1000 + D PO), Vitamin D3, Multiple Vitamins-Minerals (PRESERVISION AREDS PO), naproxen, Denosumab (PROLIA [place]), Ranibizumab (LUCENTIS IO), vitamin B-12, glucosamine-chondroitin, lactulose, omeprazole, albuterol, and sertraline. We administered denosumab.  Meds ordered this encounter  Medications  . denosumab (PROLIA) injection 60 mg    Medications Discontinued During This Encounter  Medication Reason  . guaiFENesin-codeine (CHERATUSSIN AC) 100-10 MG/5ML syrup     Follow-up: Return in about 2 weeks (around 05/11/2020).   Crecencio Mc, MD

## 2020-04-27 NOTE — Assessment & Plan Note (Signed)
Placed for treatment of prostate CA

## 2020-04-27 NOTE — Patient Instructions (Addendum)
LABS TODAY TO EVALUATE  THE WEIGHT LOSS AND MUSCLE CRAMPING  A SLEEP STUDY (HOME TEST) HAS BEEN ORDERED TO RULE OUT SLEEP APNEA    GI REFERRAL TO EVALUATE YOUR PERSISTENT SMALL CALIBER STOOLS AND WEIGHT LOSS   CT CHEST TO EVALUATE PERSISTENT "SHADOW" IN LEFT LOWER LOBE THAT MAY JUST BE SCARRING    CONTINUE SERTRALINE AT 50 MG DAILY AND FOLLOW UP IN 2 WEEKS

## 2020-04-28 ENCOUNTER — Telehealth: Payer: Self-pay | Admitting: Internal Medicine

## 2020-04-28 DIAGNOSIS — R932 Abnormal findings on diagnostic imaging of liver and biliary tract: Secondary | ICD-10-CM

## 2020-04-28 DIAGNOSIS — R0683 Snoring: Secondary | ICD-10-CM | POA: Insufficient documentation

## 2020-04-28 DIAGNOSIS — R195 Other fecal abnormalities: Secondary | ICD-10-CM

## 2020-04-28 NOTE — Telephone Encounter (Signed)
Yes, tell him to read his [employer]

## 2020-04-28 NOTE — Telephone Encounter (Signed)
Pt has seen the [employer].

## 2020-04-28 NOTE — Telephone Encounter (Signed)
Patient called in checking on his referral to GI wanted to know who was going to call him.

## 2020-04-28 NOTE — Assessment & Plan Note (Signed)
Continue Prolia,  Injection given today

## 2020-04-28 NOTE — Assessment & Plan Note (Signed)
No combination of laxatives has resulted in a return to normal.  Back to GI for second opinion.

## 2020-04-28 NOTE — Assessment & Plan Note (Signed)
I suspect that his muscle twitches,  Sore throat may be due to snoring,  OSA with hypoxia.  Home sleep study ordered

## 2020-04-28 NOTE — Progress Notes (Signed)
The radiologist felt as I did that the shadow on the chest x ray  is probably due to rib overlap, but we will proceed with the CT scan to be sure.   The labs are reassuring that your prostate CA has not returned,  thyroid function is normal,  and muscles are not enflamed as a cause for the spasms.    The office will call you with the CT chest appt,  and the GI appt .  We will also proceed with the home sleep study  Regards,   Deborra Medina, MD

## 2020-04-28 NOTE — Assessment & Plan Note (Signed)
Continue 50 mg sertraline.  All concerns listed today have been addressed

## 2020-04-28 NOTE — Assessment & Plan Note (Signed)
PSA remains detectable by low and stable   Lab Results  Component Value Date   PSA 0.51 04/27/2020   PSA 0.6 04/19/2019   PSA 0.54 04/04/2018

## 2020-04-28 NOTE — Telephone Encounter (Signed)
Spoke with pt to let him know that the referral was placed yesterday and that the GI office will be reaching out to him to schedule the appt. Advised pt that if he does not hear anything in a week to please give [place] a call so we can check on the referral. Pt wanted to know if he needs to proceed with the CT scan based off of the xray results that have now came back.

## 2020-04-28 NOTE — Assessment & Plan Note (Signed)
Discussed role  for statin therapy given documented evidence of moderate  atherosclerosis in the aorta noted on recent  CT of abdomen and  pelvis and the prognostic implications of this finding. His atherosclerosis remains untreated,  As he has has a history of severe statin induced myalgias

## 2020-04-28 NOTE — Assessment & Plan Note (Signed)
Electrolytes, thyroid and muscle enzyme are all normal.   Lab Results  Component Value Date   TSH 2.26 04/27/2020   Lab Results  Component Value Date   CKTOTAL 78 04/27/2020   Lab Results  Component Value Date   NA 138 04/27/2020   K 4.0 04/27/2020   CL 99 04/27/2020   CO2 32 04/27/2020

## 2020-04-28 NOTE — Assessment & Plan Note (Signed)
The LLL  Opacity is opined to be caused by rib overlap,  But given his life altering anxiety,  Unintentional weight loss   I have ordered CT chest w/o contrast to rule out mass.

## 2020-04-29 ENCOUNTER — Ambulatory Visit
Admission: RE | Admit: 2020-04-29 | Discharge: 2020-04-29 | Disposition: A | Discharge status: Home / Self Care | Payer: PPO | Source: Ambulatory Visit | Attending: Internal Medicine | Admitting: Internal Medicine

## 2020-04-29 ENCOUNTER — Other Ambulatory Visit: Payer: Self-pay | Admitting: Internal Medicine

## 2020-04-29 ENCOUNTER — Other Ambulatory Visit: Payer: Self-pay

## 2020-04-29 DIAGNOSIS — R634 Abnormal weight loss: Secondary | ICD-10-CM

## 2020-04-29 DIAGNOSIS — I7121 Aneurysm of the ascending aorta, without rupture: Secondary | ICD-10-CM | POA: Insufficient documentation

## 2020-04-29 DIAGNOSIS — J984 Other disorders of lung: Secondary | ICD-10-CM | POA: Diagnosis not present

## 2020-04-29 DIAGNOSIS — I712 Thoracic aortic aneurysm, without rupture: Secondary | ICD-10-CM | POA: Diagnosis not present

## 2020-04-29 DIAGNOSIS — R932 Abnormal findings on diagnostic imaging of liver and biliary tract: Secondary | ICD-10-CM | POA: Insufficient documentation

## 2020-04-29 DIAGNOSIS — R9389 Abnormal findings on diagnostic imaging of other specified body structures: Secondary | ICD-10-CM | POA: Diagnosis not present

## 2020-04-29 DIAGNOSIS — I700 Atherosclerosis of aorta: Secondary | ICD-10-CM | POA: Diagnosis not present

## 2020-04-29 HISTORY — DX: Aneurysm of the ascending aorta, without rupture: I71.21

## 2020-04-29 IMAGING — CT CT CHEST W/O CM
1 series · 15 of 34 positions shown, 19 images · non-contrast
Comparison: [DATE].

CLINICAL DATA: Weight loss, lung nodule.

EXAM:
CT CHEST WITHOUT CONTRAST
TECHNIQUE: Multidetector CT imaging of the chest was performed following the
standard protocol without IV contrast.

[Series 2: thorax · axial · 0.84mm/px · z∈[-743,-421]mm · 15 of 190 slices shown, 19 images]
[im 15/190  mediastinal]
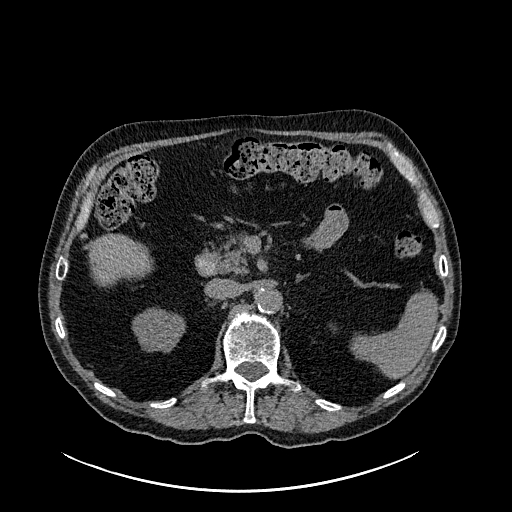
[im 15/190  lung]
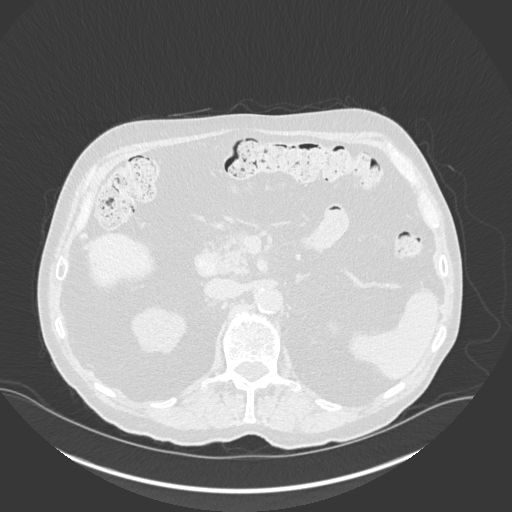
[im 29/190  lung]
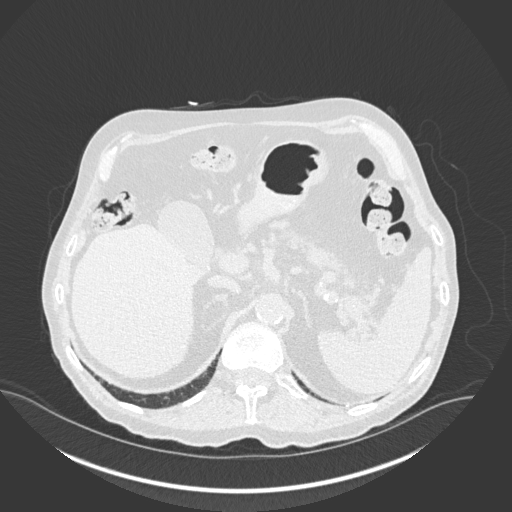
[im 38/190  lung]
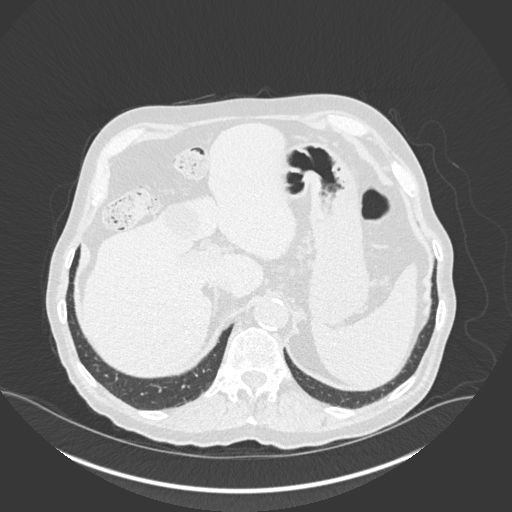
[im 50/190  lung]
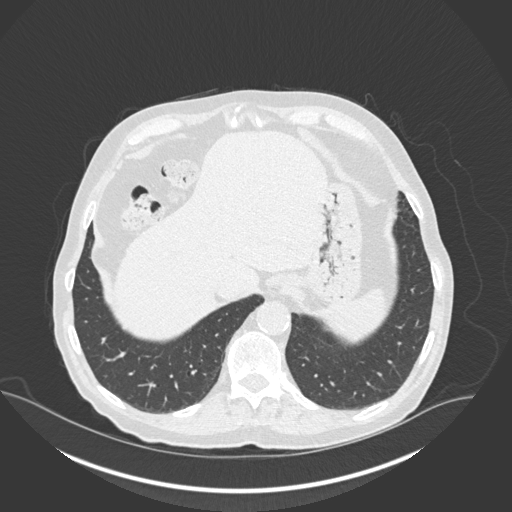
[im 64/190  mediastinal]
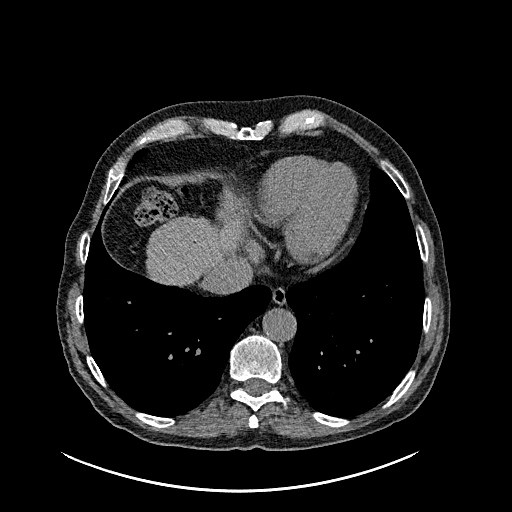
[im 64/190  lung]
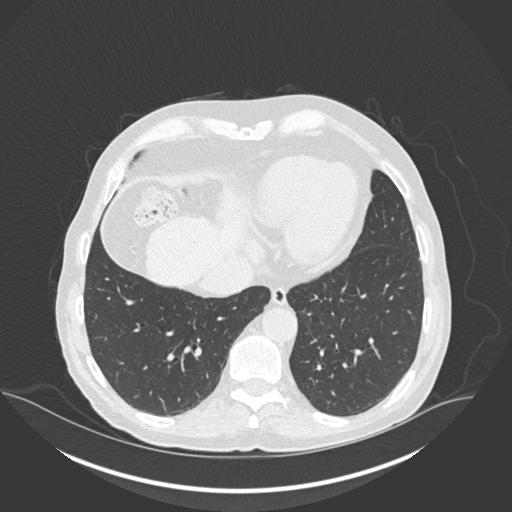
[im 76/190  lung]
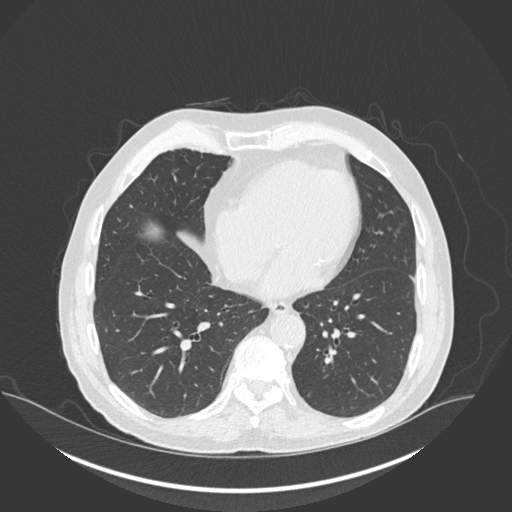
[im 85/190  lung]
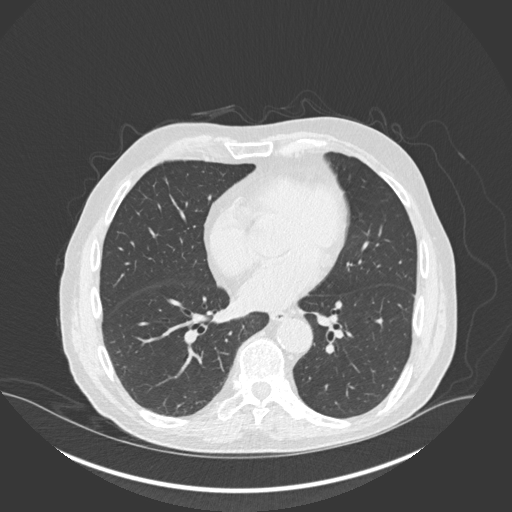
[im 99/190  lung]
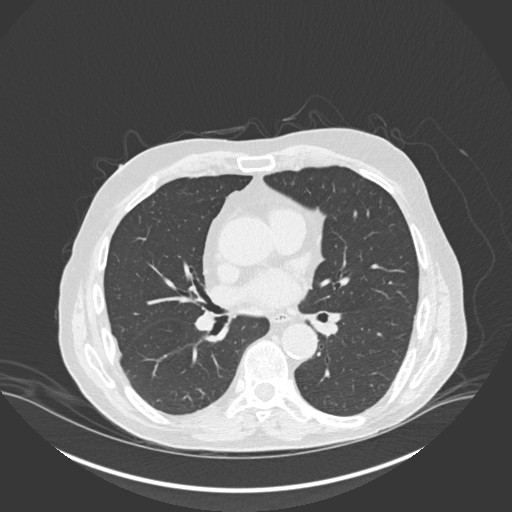
[im 106/190  mediastinal]
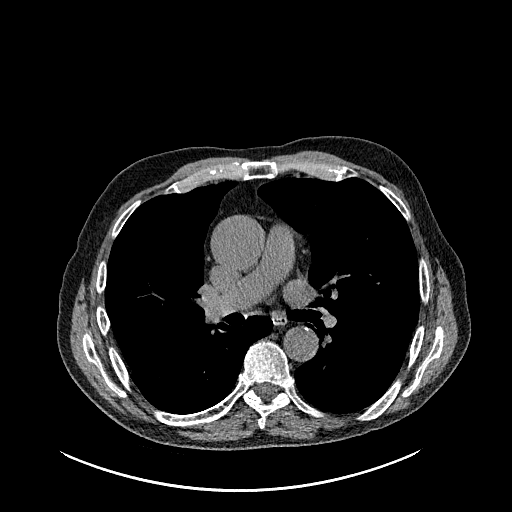
[im 106/190  lung]
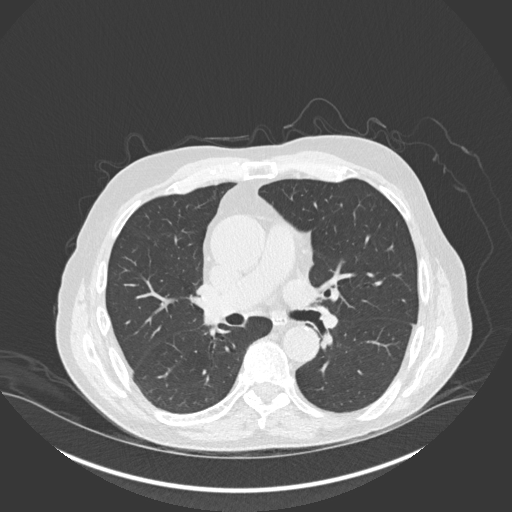
[im 114/190  lung]
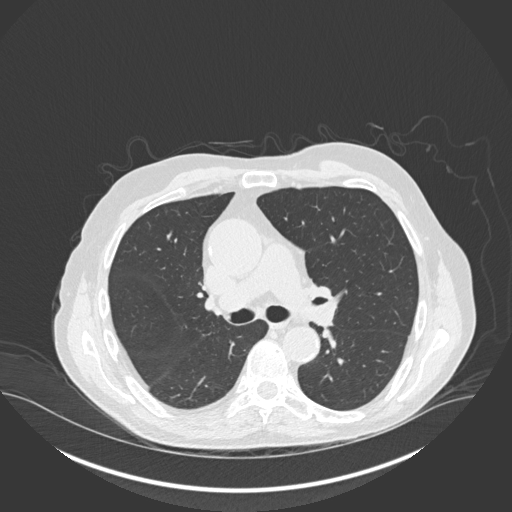
[im 127/190  lung]
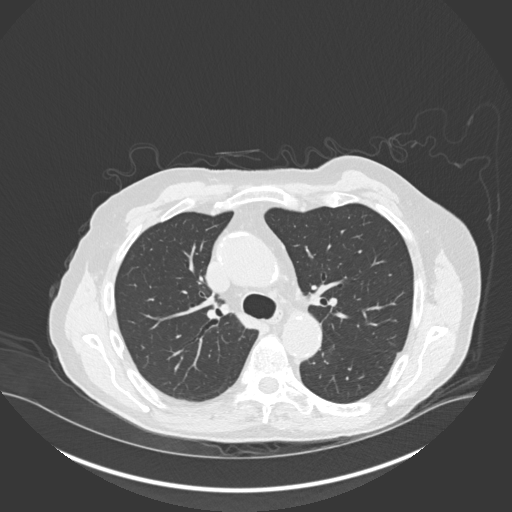
[im 141/190  lung]
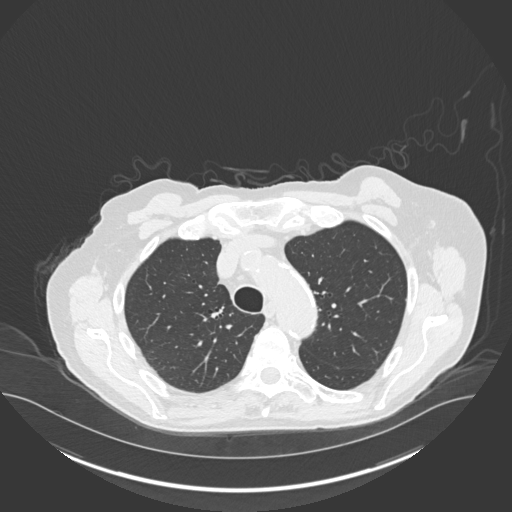
[im 152/190  mediastinal]
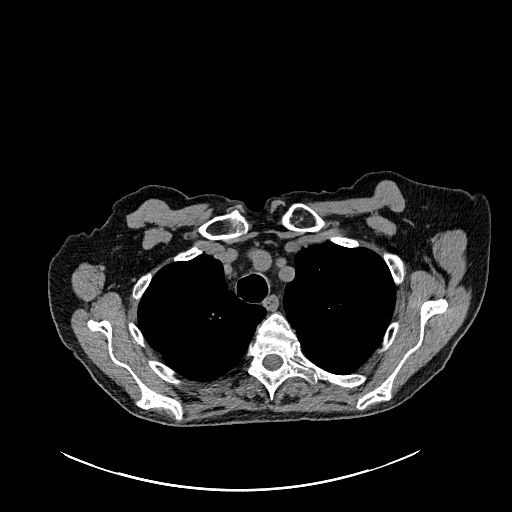
[im 152/190  lung]
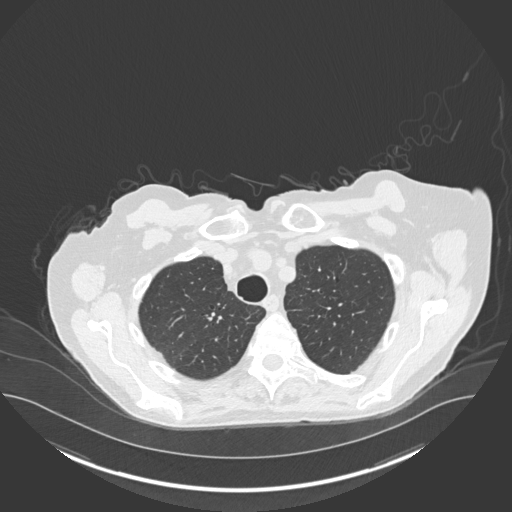
[im 162/190  lung]
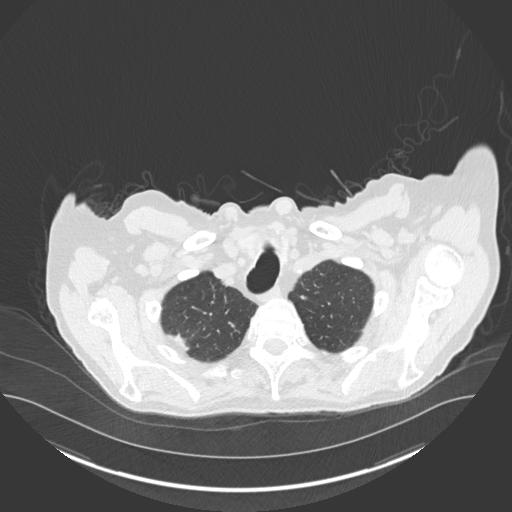
[im 176/190  lung]
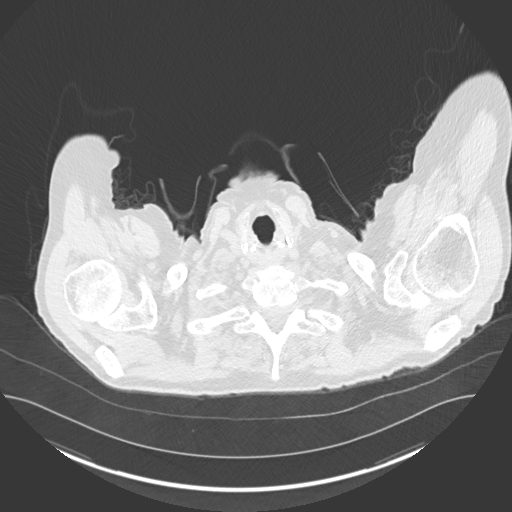

[15 of 34 positions shown; findings below may reference images not displayed]

FINDINGS: Cardiovascular: Atherosclerosis of thoracic aorta is noted. 4.7 cm
ascending thoracic aortic aneurysm is noted. Normal cardiac size. No
pericardial effusion.

Mediastinum/Nodes: No enlarged mediastinal or axillary lymph nodes.
Thyroid gland, trachea, and esophagus demonstrate no significant
findings.

Lungs/Pleura: No pneumothorax or pleural effusion is noted. Mild
biapical scarring is noted. No acute abnormality is noted.

Upper Abdomen: Nodular hepatic contours are noted concerning for
possible hepatic cirrhosis.

Musculoskeletal: No chest wall mass or suspicious bone lesions
identified.
IMPRESSION: 1. 4.7 cm ascending thoracic aortic aneurysm. Recommend semi-annual
imaging followup by CTA or MRA and referral to cardiothoracic
surgery if not already obtained. This recommendation follows [QE]
ACCF/AHA/AATS/ACR/ASA/SCA/TIMALSINA/TIMALSINA/TIMALSINA/TIMALSINA Guidelines for the
Diagnosis and Management of Patients With Thoracic Aortic Disease.
Circulation. [QE]; 121: [id number]. Aortic aneurysm NOS
([QE]-[QE]).
2. Nodular hepatic contours are noted concerning for possible
hepatic cirrhosis.
3. Aortic atherosclerosis.

Aortic Atherosclerosis ([QE]-[QE]).

## 2020-04-29 MED ORDER — ALBUTEROL SULFATE HFA 108 (90 BASE) MCG/ACT IN AERS: 2.0000 | INHALATION_SPRAY | Freq: Four times a day (QID) | RESPIRATORY_TRACT | 0 refills | Status: DC | PRN

## 2020-04-29 NOTE — Progress Notes (Signed)
Your labs , including your PSA,  were all normal.   Regards,   Deborra Medina, MD

## 2020-04-29 NOTE — Progress Notes (Signed)
Your chest CT was reassuring that there is no lung mass or bone lesions;  but some unexpected findings were noted and require further evaluation:  1) you have a  thoracic aortic aneurysm that will need to be followed by a cardiothoracic surgeon  with periodic  imaging to make sure it is not enlarging/  I will make a referral to the surgeons in [place] (there are none in [place])  2) the top part of your liver had a nodular appearance suggestive of cirrhosis.  This may not require any further action,  but should be discussed at your  upcoming GI evaluation .    We will discuss this more at your visit next week

## 2020-04-30 ENCOUNTER — Other Ambulatory Visit
Admission: RE | Admit: 2020-04-30 | Discharge: 2020-04-30 | Disposition: A | Discharge status: Home / Self Care | Payer: PPO | Source: Ambulatory Visit | Attending: Internal Medicine | Admitting: Internal Medicine

## 2020-04-30 DIAGNOSIS — K7460 Unspecified cirrhosis of liver: Secondary | ICD-10-CM | POA: Insufficient documentation

## 2020-04-30 DIAGNOSIS — R932 Abnormal findings on diagnostic imaging of liver and biliary tract: Secondary | ICD-10-CM | POA: Diagnosis present

## 2020-04-30 LAB — HEPATIC FUNCTION PANEL
ALT: 20 U/L (ref 0–44)
AST: 20 U/L (ref 15–41)
Albumin: 4.1 g/dL (ref 3.5–5.0)
Alkaline Phosphatase: 39 U/L (ref 38–126)
Bilirubin, Direct: <0.1 mg/dL (ref 0.0–0.2)
Total Bilirubin: 0.6 mg/dL (ref 0.3–1.2)
Total Protein: 6.8 g/dL (ref 6.5–8.1)

## 2020-04-30 LAB — PROTIME-INR
INR: 1 (ref 0.8–1.2)
Prothrombin Time: 13 seconds (ref 11.4–15.2)

## 2020-04-30 NOTE — Telephone Encounter (Signed)
Referral back to [facility] in progress . Because of the incidental finding n the CT scan about his liver,  I would like to check his liver function with a few additional  blood tests which I have ordered

## 2020-04-30 NOTE — Telephone Encounter (Signed)
Pt states that [facility] could not see him until the first of the year. He states that his problem does not need to wait that long. Please refer to somewhere else. Thank you

## 2020-04-30 NOTE — Telephone Encounter (Signed)
Spoke with pt and he stated that he has a sore throat so labs have been reordered for [facility] lab at [facility]. Pt stated that he will go over there tonight. Pt was also advised that if his symptoms worsened or he developed a fever to please go to UC to be evaluated. Pt gave a verbal understanding.

## 2020-05-01 ENCOUNTER — Other Ambulatory Visit: Payer: Self-pay | Admitting: Internal Medicine

## 2020-05-01 ENCOUNTER — Other Ambulatory Visit: Payer: PPO

## 2020-05-01 DIAGNOSIS — R932 Abnormal findings on diagnostic imaging of liver and biliary tract: Secondary | ICD-10-CM

## 2020-05-01 NOTE — Progress Notes (Signed)
Your liver enzymes are normal.  To save time,  I would like to have you get a dedicated liver ultrasound prior to your GI appt so that the evaluation can be expedited.  I will order the ultrasound; it will most likely be next week before it is done because it is not urgent

## 2020-05-04 ENCOUNTER — Encounter: Payer: Self-pay | Admitting: Internal Medicine

## 2020-05-04 ENCOUNTER — Telehealth: Payer: Self-pay | Admitting: Internal Medicine

## 2020-05-04 NOTE — Telephone Encounter (Signed)
Patient not having any other sx. Sx have been getting worse since starting the zoloft three weeks ago. Please advise.

## 2020-05-04 NOTE — Telephone Encounter (Signed)
Pt called he is having an increased shakes and wanted to know if the sertraline (ZOLOFT) 50 MG tablet is causing this   Pt also stated that he is not seeing any help with this med

## 2020-05-04 NOTE — Telephone Encounter (Signed)
11/22 is fine

## 2020-05-04 NOTE — Telephone Encounter (Signed)
Spoke with pt and advised him to stop the zoloft and keep his appt on 05/11/2020. Pt wanted to let you know that he will be doing his in home sleep study tonight and tomorrow night and then will be having his abdominal US on Wednesday.

## 2020-05-04 NOTE — Telephone Encounter (Signed)
Tell him to stop the zoloft  and schedule him a follow up visit to discuss the CT scan,  Etc if he does not have on scheduled

## 2020-05-04 NOTE — Telephone Encounter (Signed)
Pt is scheduled on 05/11/2020. Is that okay or does he need to be scheduled sooner?

## 2020-05-06 ENCOUNTER — Other Ambulatory Visit: Payer: Self-pay

## 2020-05-06 ENCOUNTER — Ambulatory Visit
Admission: RE | Admit: 2020-05-06 | Discharge: 2020-05-06 | Disposition: A | Discharge status: Home / Self Care | Payer: PPO | Source: Ambulatory Visit | Attending: Internal Medicine | Admitting: Internal Medicine

## 2020-05-06 DIAGNOSIS — R932 Abnormal findings on diagnostic imaging of liver and biliary tract: Secondary | ICD-10-CM

## 2020-05-06 DIAGNOSIS — K7689 Other specified diseases of liver: Secondary | ICD-10-CM | POA: Diagnosis not present

## 2020-05-07 ENCOUNTER — Other Ambulatory Visit: Payer: Self-pay | Admitting: Internal Medicine

## 2020-05-07 ENCOUNTER — Telehealth: Payer: Self-pay | Admitting: Internal Medicine

## 2020-05-07 NOTE — Telephone Encounter (Signed)
YES,  HE CAN COME IN .  I cannot tell him what to try if I don't know specifically what he has tried

## 2020-05-07 NOTE — Telephone Encounter (Signed)
Patient continues to have sinus issues with drainage, mucus is clear. Nothing over the counter is helping to stop. No other symptoms. What can patient take to stop? Also, patient has appointment with [place] next Monday and he is expecting to come into office. Patient states Dr. Derrel Nip has seen him in office for the issue recently. Can patient still come into office?

## 2020-05-07 NOTE — Telephone Encounter (Signed)
Pt was given message below

## 2020-05-07 NOTE — Telephone Encounter (Signed)
LMTCB

## 2020-05-08 NOTE — Progress Notes (Signed)
The liver ultrasound is also suggestive of cirrhosis, which was suggested by the CT .  The labs we did earlier  were normal,  that just tells [place] that the liver is still functioning well and not inflamed,.  We will discuss it more at your  visit

## 2020-05-11 ENCOUNTER — Encounter: Payer: Self-pay | Admitting: Internal Medicine

## 2020-05-11 ENCOUNTER — Ambulatory Visit (INDEPENDENT_AMBULATORY_CARE_PROVIDER_SITE_OTHER): Payer: PPO | Admitting: Internal Medicine

## 2020-05-11 ENCOUNTER — Other Ambulatory Visit: Payer: Self-pay

## 2020-05-11 VITALS — BP 114/72 | HR 79 | Temp 97.5°F | Resp 16 | Ht 76.0 in | Wt 180.8 lb

## 2020-05-11 DIAGNOSIS — R198 Other specified symptoms and signs involving the digestive system and abdomen: Secondary | ICD-10-CM

## 2020-05-11 DIAGNOSIS — I712 Thoracic aortic aneurysm, without rupture: Secondary | ICD-10-CM | POA: Diagnosis not present

## 2020-05-11 DIAGNOSIS — E7800 Pure hypercholesterolemia, unspecified: Secondary | ICD-10-CM | POA: Diagnosis not present

## 2020-05-11 DIAGNOSIS — R0683 Snoring: Secondary | ICD-10-CM

## 2020-05-11 DIAGNOSIS — F418 Other specified anxiety disorders: Secondary | ICD-10-CM | POA: Diagnosis not present

## 2020-05-11 DIAGNOSIS — K6289 Other specified diseases of anus and rectum: Secondary | ICD-10-CM

## 2020-05-11 DIAGNOSIS — R195 Other fecal abnormalities: Secondary | ICD-10-CM

## 2020-05-11 DIAGNOSIS — I7121 Aneurysm of the ascending aorta, without rupture: Secondary | ICD-10-CM

## 2020-05-11 DIAGNOSIS — I700 Atherosclerosis of aorta: Secondary | ICD-10-CM | POA: Diagnosis not present

## 2020-05-11 DIAGNOSIS — R252 Cramp and spasm: Secondary | ICD-10-CM

## 2020-05-11 DIAGNOSIS — K7460 Unspecified cirrhosis of liver: Secondary | ICD-10-CM

## 2020-05-11 LAB — HEPATIC FUNCTION PANEL
ALT: 20 U/L (ref 0–53)
AST: 19 U/L (ref 0–37)
Albumin: 4.3 g/dL (ref 3.5–5.2)
Alkaline Phosphatase: 40 U/L (ref 39–117)
Bilirubin, Direct: 0.1 mg/dL (ref 0.0–0.3)
Total Bilirubin: 0.5 mg/dL (ref 0.2–1.2)
Total Protein: 6.6 g/dL (ref 6.0–8.3)

## 2020-05-11 LAB — IBC + FERRITIN
Ferritin: 328.8 ng/mL — ABNORMAL HIGH (ref 22.0–322.0)
Iron: 133 ug/dL (ref 42–165)
Saturation Ratios: 48.7 % (ref 20.0–50.0)
Transferrin: 195 mg/dL — ABNORMAL LOW (ref 212.0–360.0)

## 2020-05-11 MED ORDER — HYDROCORTISONE ACE-PRAMOXINE 1-1 % EX CREA: 1.0000 "application " | TOPICAL_CREAM | Freq: Two times a day (BID) | CUTANEOUS | 0 refills | Status: DC

## 2020-05-11 MED ORDER — TRAZODONE HCL 50 MG PO TABS: 25.0000 mg | ORAL_TABLET | Freq: Every day | ORAL | 0 refills | Status: DC

## 2020-05-11 NOTE — Progress Notes (Signed)
Subjective:  Patient ID: George Owens, male    DOB: 11-06-1931  Age: 86 y.o. MRN: [id number]  CC: The primary encounter diagnosis was Rectal pain. Diagnoses of Abnormal stool caliber, Cirrhosis of liver without ascites, unspecified hepatic cirrhosis type ([place]), Anxiety about health, Change in bowel function, Muscle cramps, Pure hypercholesterolemia, Thoracic ascending aortic aneurysm [facility]), Thoracic aortic atherosclerosis ([place]), and Snoring were also pertinent to this visit.  HPI MOHAMEDAMIN NIFONG presents for follow up on  Anxiety about health , and abnormal imaging studies done recently  This visit occurred during the SARS-CoV-2 public health emergency.  Safety protocols were in place, including screening questions prior to the visit, additional usage of staff PPE, and extensive cleaning of exam room while observing appropriate contact time as indicated for disinfecting solutions.    Patient has received both doses of the available COVID 19 vaccine without complications.  Patient continues to mask when outside of the home except when walking in yard or at safe distances from others .  Patient denies any change in mood or development of unhealthhtsy behaviors resuting from the pandemic's restriction of activities and socialization.    Tremors  Have  Improved since stopping zoloft 10 days ago.   The jerking has improved as well and is not occurring on a regular basis. The home sleep study has been done but not reported.    Screening test for depression positive and  Reviewed:  Appetite improved since stopping zoloft. Enjoying eating again.  Does not want an alternative medication at this time because he is in general feeling better.   GI : continues to have altered bowel habits.  He squats to urinate due to his height , and feels pressure in his rectum such that he strains to produce a stool every time he sits down to empty his bladder, which has been up to 8 daily . [name] Rectum is sore from  wiping. The Caliber of each stool is the size of a pencil.  Stools are either pencil thin and solid  Or lima bean sized and soft.   Has stopped taking miralax for the past 3 nights  since there was no change.  Denies rectal pain except for wiping so often.    He has continued unintentional weight loss,  A total of 10 lbs since September , and he endorses feeling cold most of the time .   REviewed April 2021 colonoscopy:  Multiple polyps removed,  Internal hemorrhoids noted and banding discussed but deferred.  He is requesting an urgent GI evaluation and was not satisfied with the response from [facility] when he requested an earlier appt  Than Jan 10 (told to go to ER) so he did not take the appointment.   CT chest reviewed:  4.7 cm thoracic aneurysm reviewed .  Vascular referral under way.  Home bp readings have been well below 130/80  Cirrhotic appearance to liver noted and confirmed with ultrasound.  No history of alcohol use or Hepatitis C .  Reviewed workup to follow:  Serologies for causes,  And GI follow up   Outpatient Medications Prior to Visit  Medication Sig Dispense Refill  . Calcium Carb-Cholecalciferol (CALCIUM 1000 + D PO) Take 1 tablet by mouth daily.     . Cholecalciferol (VITAMIN D3) 400 UNITS CAPS Take 2 capsules by mouth daily.     . Denosumab (PROLIA [place]) Inject into the skin every 6 (six) months.    [name] glucosamine-chondroitin 500-400 MG tablet Take 1  tablet by mouth daily.    [name] lactulose (CHRONULAC) 10 GM/15ML solution TAKE 30 MLS (20 G TOTAL) BY MOUTH 2 (TWO) TIMES DAILY. 1892 mL 3  . loratadine (CLARITIN) 10 MG tablet Take 10 mg by mouth daily as needed.     . Multiple Vitamins-Minerals (PRESERVISION AREDS PO) Take 2 capsules by mouth daily.    . naproxen (NAPROSYN) 250 MG tablet Take 250 mg by mouth as needed.    [name] omeprazole (PRILOSEC) 40 MG capsule TAKE 1 CAPSULE (40 MG TOTAL) BY MOUTH DAILY. IN THE MORNING 30 MINUTES PRIOR TO EATING 90 capsule 3  . Ranibizumab  (LUCENTIS IO) Inject into the eye as needed. As needed every three months.    . sertraline (ZOLOFT) 50 MG tablet TAKE 1 TABLET BY MOUTH EVERY DAY 90 tablet 2  . albuterol (VENTOLIN HFA) 108 (90 Base) MCG/ACT inhaler Inhale 2 puffs into the lungs every 6 (six) hours as needed for wheezing or shortness of breath. (Patient not taking: Reported on 05/11/2020) 8 g 0  . vitamin B-12 (CYANOCOBALAMIN) 1000 MCG tablet Take 1,000 mcg by mouth daily. (Patient not taking: Reported on 05/11/2020)     No facility-administered medications prior to visit.    Review of Systems;  Patient denies headache, fevers, malaise, unintentional weight loss, skin rash, eye pain, sinus congestion and sinus pain, sore throat, dysphagia,  hemoptysis , cough, dyspnea, wheezing, chest pain, palpitations, orthopnea, edema, abdominal pain, nausea, melena,flank pain, dysuria, hematuria, urinary  Frequency, nocturia, numbness, tingling, seizures,  Focal weakness, Loss of consciousness,  Tremor, insomnia, depression, anxiety, and suicidal ideation.      Objective:  BP 114/72 (BP Location: Left Arm, Patient Position: Sitting, Cuff Size: Normal)   Pulse 79   Temp (!) 97.5 F (36.4 C) (Oral)   Resp 16   Ht 6\' 4"  (1.93 m)   Wt 180 lb 12.8 oz (82 kg)   SpO2 99%   BMI 22.01 kg/m   BP Readings from Last 3 Encounters:  05/11/20 114/72  04/27/20 134/70  04/14/20 122/72    Wt Readings from Last 3 Encounters:  05/11/20 180 lb 12.8 oz (82 kg)  04/27/20 184 lb 6.4 oz (83.6 kg)  04/14/20 185 lb 12.8 oz (84.3 kg)    General appearance: alert, cooperative and appears stated age Ears: normal TM's and external ear canals both ears Throat: lips, mucosa, and tongue normal; teeth and gums normal Neck: no adenopathy, no carotid bruit, supple, symmetrical, trachea midline and thyroid not enlarged, symmetric, no tenderness/mass/nodules Back: symmetric, no curvature. ROM normal. No CVA tenderness. Lungs: clear to auscultation  bilaterally Heart: regular rate and rhythm, S1, S2 normal, no murmur, click, rub or gallop Abdomen: soft, non-tender; bowel sounds normal; no masses,  no organomegaly Pulses: 2+ and symmetric Skin: Skin color, texture, turgor normal. No rashes or lesions Lymph nodes: Cervical, supraclavicular, and axillary nodes normal.  No results found for: HGBA1C  Lab Results  Component Value Date   CREATININE 0.84 04/27/2020   CREATININE 1.02 04/07/2020   CREATININE 0.94 10/01/2019    Lab Results  Component Value Date   WBC 5.1 10/01/2019   HGB 13.7 10/01/2019   HCT 40.0 10/01/2019   PLT 142.0 (L) 10/01/2019   GLUCOSE 95 04/27/2020   CHOL 198 04/19/2019   TRIG 171 (H) 04/19/2019   HDL 41 04/19/2019   LDLDIRECT 129.0 03/31/2017   LDLCALC 128 (H) 04/19/2019   ALT 20 05/11/2020   AST 19 05/11/2020   NA 138 04/27/2020  K 4.0 04/27/2020   CL 99 04/27/2020   CREATININE 0.84 04/27/2020   BUN 20 04/27/2020   CO2 32 04/27/2020   TSH 2.26 04/27/2020   PSA 0.51 04/27/2020   INR 1.0 04/30/2020    US Abdomen Limited RUQ (LIVER/GB)  Result Date: 05/06/2020 CLINICAL DATA:  Nodular appearance of liver on chest CT. EXAM: ULTRASOUND ABDOMEN LIMITED RIGHT UPPER QUADRANT COMPARISON:  CT chest April 29, 2020. FINDINGS: Gallbladder: No gallstones or wall thickening visualized. No sonographic Murphy sign noted by sonographer. Common bile duct: Diameter: 4.9 mm, within normal limits. Liver: No focal lesion identified. Diffusely coarsened echotexture with surface nodularity. The liver is similar in echogenicity to the adjacent index organ, the right kidney. Portal vein is patent on color Doppler imaging with normal direction of blood flow towards the liver. IMPRESSION: Surface nodularity of the liver with diffusely coarsened echotexture, which can be seen with cirrhosis. Electronically Signed   By: Margaretha Sheffield MD   On: 05/06/2020 16:20    Assessment & Plan:   Problem List Items Addressed This  Visit      Unprioritized   Thoracic ascending aortic aneurysm (HCC)    BP is at goal and Vascular evaluation is pending       Thoracic aortic atherosclerosis ([place])     Discussed role  for statin therapy given documented evidence of moderate  atherosclerosis in the aorta noted on recent  CT of abdomen and  pelvis and the prognostic implications of this finding. His atherosclerosis remains untreated,  As he has has a history of severe statin induced myalgias        Snoring    Awaiting home sleep study report to evaluate for OSA and hypoxia as a cause of the muscle twitching       Muscle cramps    Etiology unclear.  Seems to be improving. No further workup at this point.       Hyperlipidemia    Untreated due to severe statin related myalgias  Lab Results  Component Value Date   CHOL 198 04/19/2019   HDL 41 04/19/2019   LDLCALC 128 (H) 04/19/2019   LDLDIRECT 129.0 03/31/2017   TRIG 171 (H) 04/19/2019   CHOLHDL 4.8 04/19/2019         Cirrhosis of liver ([place])    Suggested by CT/US.  Etiology unclear  Serologies ordered and will follow up with GI      Relevant Orders   Alkaline phosphatase, isoenzymes (Completed)   ANA   Anti-Smith antibody   Hepatitis B core antibody, total   Hepatitis B surface antibody,qualitative   Hepatitis B surface antigen   Hepatitis C antibody   IBC + Ferritin (Completed)   Mitochondrial antibodies   Hepatic function panel (Completed)   Change in bowel function    Given his recent colonoscopy in April 2021, I suspect that his current bowel habits are due to pressure from the internal hemorrhoids.  Prescribing proctofoam HC as a trial.  Encouraged to stop straining.  GI referral redone.       Anxiety about health    He did not tolerate sertraline and feels better since stopping it.  Addressing each concern has helped alleviate his fear.  He defers medication at this time.       Relevant Medications   traZODone (DESYREL) 50 MG tablet      Other Visit Diagnoses    Rectal pain    -  Primary   Relevant Orders   Ambulatory referral  to Gastroenterology   Abnormal stool caliber       Relevant Orders   Ambulatory referral to Gastroenterology      I have discontinued Tamarion A. [place]'s vitamin B-12, albuterol, and sertraline. I am also having him start on traZODone and pramoxine-hydrocortisone. Additionally, I am having him maintain his loratadine, Calcium Carb-Cholecalciferol (CALCIUM 1000 + D PO), Vitamin D3, Multiple Vitamins-Minerals (PRESERVISION AREDS PO), naproxen, Denosumab (PROLIA [place]), Ranibizumab (LUCENTIS IO), glucosamine-chondroitin, lactulose, and omeprazole.  Meds ordered this encounter  Medications  . traZODone (DESYREL) 50 MG tablet    Sig: Take 0.5 tablets (25 mg total) by mouth at bedtime. May increase to full tablet If needed    Dispense:  90 tablet    Refill:  0  . pramoxine-hydrocortisone (PROCTOCREAM-HC) 1-1 % rectal cream    Sig: Place 1 application rectally 2 (two) times daily.    Dispense:  30 g    Refill:  0    Medications Discontinued During This Encounter  Medication Reason  . albuterol (VENTOLIN HFA) 108 (90 Base) MCG/ACT inhaler   . vitamin B-12 (CYANOCOBALAMIN) 1000 MCG tablet   . sertraline (ZOLOFT) 50 MG tablet     Follow-up: No follow-ups on file.   Crecencio Mc, MD

## 2020-05-11 NOTE — Patient Instructions (Signed)
I have prescribed a steroid cream for use twice daily on your internal hemorrhoids  GI referral in progress  Trazodone trial for insomnia :  Start with 1/2 tablet 30 minutes before  Bedtime  Hydrocortisone; Pramoxine Rectal Aerosol Foam What is this medicine? HYDROCORTISONE; PRAMOXINE (hye droe KOR ti sone; pra MOX een) is a combination of a corticosteroid and an anesthetic. It is used on the anal area to decrease swelling, itching, and pain caused by minor skin irritations or hemorrhoids. This medicine may be used for other purposes; ask your health care provider or pharmacist if you have questions. COMMON BRAND NAME(S): ProctoFoam HC What should I tell my health care provider before I take this medicine? They need to know if you have any of these conditions:  an unusual or allergic reaction to hydrocortisone, corticosteroids, pramoxine, other medicines, foods, dyes, or preservatives  pregnant or trying to get pregnant  breast-feeding How should I use this medicine? This medicine is for rectal use only. Do not take by mouth. Follow directions on the prescription label. Gently cleanse the affected area prior to use. Do not use on healthy skin or over large areas of skin. This medicine is usually applied 3 to 4 times per day, or as directed by your health care professional. Use the applicator supplied with the medicine for anal use. This foam can also be applied topically to the perianal area. Place foam onto a cleansing tissue or pad and gently rub into the affected area. Fingers or any other mechanical device should not be used to administer the foam. Do not use an airtight bandage to cover the affected area unless your doctor or health care professional tells you to. Do not get this medicine in your eyes. If you do, rinse out with plenty of cool tap water. Do not use your medicine more often than directed. Do not to use more medicine than prescribed. Do not use for more than 14 days. Talk  to your pediatrician regarding the use of this medicine in children. Special care may be needed. Overdosage: If you think you have taken too much of this medicine contact a poison control center or emergency room at once. NOTE: This medicine is only for you. Do not share this medicine with others. What if I miss a dose? If you miss a dose, use it as soon as you can. If it is almost time for your next dose, use only that dose. Do not use double or extra doses. What may interact with this medicine? Interactions are not expected. Do not use any other skin products on the treated area without asking your doctor or health care professional. This list may not describe all possible interactions. Give your health care provider a list of all the medicines, herbs, non-prescription drugs, or dietary supplements you use. Also tell them if you smoke, drink alcohol, or use illegal drugs. Some items may interact with your medicine. What should I watch for while using this medicine? Tell your doctor or health care professional if your symptoms do not start to get better after one week. Tell your doctor or health care professional if you are exposed to anyone with measles or chickenpox, or if you develop sores or blisters that do not heal properly. What side effects may I notice from receiving this medicine? Side effects that you should report to your doctor or health care professional as soon as possible:  allergic reactions like skin rash, itching or hives, swelling of the face,  lips, or tongue  dark red spots or bumps on the skin  lack of healing of skin  skin infection  skin irritation  thinning of the skin Side effects that usually do not require medical attention (report to your doctor or health care professional if they continue or are bothersome):  burning or stinging sensation  dryness This list may not describe all possible side effects. Call your doctor for medical advice about side effects.  You may report side effects to FDA at 1-800-FDA-1088. Where should I keep my medicine? Keep out of the reach of children. Store at room temperature between 20 and 25 C (68 and 77 F). Do not refrigerate or freeze. Store the container upright. Contents of the container are under pressure. Throw away any unused medicine after the expiration date. NOTE: This sheet is a summary. It may not cover all possible information. If you have questions about this medicine, talk to your doctor, pharmacist, or health care provider.  2020 Elsevier/Gold Standard (2007-10-19 16:26:04)

## 2020-05-12 DIAGNOSIS — K7460 Unspecified cirrhosis of liver: Secondary | ICD-10-CM | POA: Insufficient documentation

## 2020-05-12 HISTORY — DX: Unspecified cirrhosis of liver: K74.60

## 2020-05-12 LAB — ANTI-SMITH ANTIBODY: ENA SM Ab Ser-aCnc: <1 AI

## 2020-05-12 NOTE — Assessment & Plan Note (Signed)
Untreated due to severe statin related myalgias  Lab Results  Component Value Date   CHOL 198 04/19/2019   HDL 41 04/19/2019   LDLCALC 128 (H) 04/19/2019   LDLDIRECT 129.0 03/31/2017   TRIG 171 (H) 04/19/2019   CHOLHDL 4.8 04/19/2019

## 2020-05-12 NOTE — Assessment & Plan Note (Signed)
Suggested by CT/US.  Etiology unclear  Serologies ordered and will follow up with GI

## 2020-05-12 NOTE — Assessment & Plan Note (Signed)
Awaiting home sleep study report to evaluate for OSA and hypoxia as a cause of the muscle twitching

## 2020-05-12 NOTE — Assessment & Plan Note (Signed)
Given his recent colonoscopy in April 2021, I suspect that his current bowel habits are due to pressure from the internal hemorrhoids.  Prescribing proctofoam HC as a trial.  Encouraged to stop straining.  GI referral redone.

## 2020-05-12 NOTE — Assessment & Plan Note (Signed)
BP is at goal and Vascular evaluation is pending

## 2020-05-12 NOTE — Assessment & Plan Note (Signed)
He did not tolerate sertraline and feels better since stopping it.  Addressing each concern has helped alleviate his fear.  He defers medication at this time.

## 2020-05-12 NOTE — Assessment & Plan Note (Signed)
Etiology unclear.  Seems to be improving. No further workup at this point.

## 2020-05-12 NOTE — Assessment & Plan Note (Signed)
Discussed role  for statin therapy given documented evidence of moderate  atherosclerosis in the aorta noted on recent  CT of abdomen and  pelvis and the prognostic implications of this finding. His atherosclerosis remains untreated,  As he has has a history of severe statin induced myalgias

## 2020-05-13 LAB — HEPATITIS B SURFACE ANTIBODY,QUALITATIVE: Hep B S Ab: NONREACTIVE

## 2020-05-13 LAB — HEPATITIS B CORE ANTIBODY, TOTAL: Hep B Core Total Ab: NONREACTIVE

## 2020-05-13 LAB — MITOCHONDRIAL ANTIBODIES: Mitochondrial M2 Ab, IgG: <=20 U

## 2020-05-13 LAB — HEPATITIS C ANTIBODY
Hepatitis C Ab: NONREACTIVE
SIGNAL TO CUT-OFF: 0.01 (ref <1.00)

## 2020-05-13 LAB — ANA: Anti Nuclear Antibody (ANA): POSITIVE — AB

## 2020-05-13 LAB — ANTI-NUCLEAR AB-TITER (ANA TITER): ANA Titer 1: 1:40 {titer} — ABNORMAL HIGH

## 2020-05-13 LAB — HEPATITIS B SURFACE ANTIGEN: Hepatitis B Surface Ag: NONREACTIVE

## 2020-05-14 LAB — ALKALINE PHOSPHATASE, ISOENZYMES
Alkaline Phosphatase: 46 IU/L (ref 44–121)
BONE FRACTION: 28 % (ref 12–68)
INTESTINAL FRAC.: 9 % (ref 0–18)
LIVER FRACTION: 63 % (ref 13–88)

## 2020-05-16 NOTE — Progress Notes (Signed)
The additional blood tests that you recently had done as part of the workup for cirrhosis were slightly abnormal , but by no means diagnostic of any kind of autoimmune disease.  I will defer further workup to the gastroenterologist that you will be seeing .   Regards,   Deborra Medina, MD

## 2020-05-18 ENCOUNTER — Telehealth: Payer: Self-pay | Admitting: Internal Medicine

## 2020-05-18 ENCOUNTER — Encounter: Payer: Self-pay | Admitting: Cardiothoracic Surgery

## 2020-05-18 ENCOUNTER — Other Ambulatory Visit: Payer: Self-pay

## 2020-05-18 ENCOUNTER — Institutional Professional Consult (permissible substitution): Payer: PPO | Admitting: Cardiothoracic Surgery

## 2020-05-18 VITALS — BP 120/72 | HR 71 | Temp 97.9°F | Resp 20 | Ht 76.0 in | Wt 185.6 lb

## 2020-05-18 DIAGNOSIS — I712 Thoracic aortic aneurysm, without rupture: Secondary | ICD-10-CM

## 2020-05-18 DIAGNOSIS — I7121 Aneurysm of the ascending aorta, without rupture: Secondary | ICD-10-CM

## 2020-05-18 DIAGNOSIS — R0683 Snoring: Secondary | ICD-10-CM

## 2020-05-18 NOTE — Assessment & Plan Note (Signed)
HIs  home sleep study report has been received.  Although there was some snoring, There is no evidence of sleep apnea or of oxygen desaturations .  His pulse remained within normal limits as well .

## 2020-05-25 NOTE — Progress Notes (Signed)
[address]       [place] [address]             [phone]     CARDIOTHORACIC SURGERY CONSULTATION REPORT  Referring Provider is Crecencio Mc, MD Primary Cardiologist is No primary care provider on file. PCP is Crecencio Mc, MD  Chief Complaint  Patient presents with  . Thoracic Aortic Aneurysm    new patient consultation, Chest CT 11/10    HPI:  The patient was in his usual state of health until recently when he presented with weight loss.  He underwent CT scan of the chest to evaluate for lung nodule.  This demonstrated an incidentally discovered ascending aortic aneurysm measured approximately 4.7 cm in maximal diameter.  He denies any chest pain.  He has no family history of aneurysm or personal hinge history of aneurysm.  He does not have high blood pressure.  He does not smoke.  Past Medical History:  Diagnosis Date  . Allergy   . Basal cell carcinoma    right temple Dr. Evorn Gong 12/2018  . Chicken pox   . Colon polyps   . COVID-19 virus infection 03/16/2020  . GERD (gastroesophageal reflux disease)   . history of prostate CA 2006  . Hyperlipidemia   . Leg cramps   . Neuropathy   . Osteoporosis     Past Surgical History:  Procedure Laterality Date  . CATARACT EXTRACTION Right   . CATARACT EXTRACTION W/PHACO Left 12/26/2017   Procedure: CATARACT EXTRACTION PHACO AND INTRAOCULAR LENS PLACEMENT ([place])  COMPLICATED LEFT;  Surgeon: Leandrew Koyanagi, MD;  Location: [facility];  Service: Ophthalmology;  Laterality: Left;  [place]  . colonoscopy    . EYE MUSCLE SURGERY Left   . TONSILLECTOMY AND ADENOIDECTOMY      Family History  Problem Relation Age of Onset  . Stroke Mother   . Arthritis Father   . Heart disease Father   . Brain cancer Brother        1/2 brother (possibly related to mother NOT related to patient)  . Colon cancer Neg Hx   . Esophageal cancer Neg Hx   . Pancreatic cancer Neg Hx   . Stomach  cancer Neg Hx   . Liver disease Neg Hx     Social History   Socioeconomic History  . Marital status: Married    Spouse name: Eloise  . Number of children: 4  . Years of education: Not on file  . Highest education level: Not on file  Occupational History  . Occupation: retired  Tobacco Use  . Smoking status: Never Smoker  . Smokeless tobacco: Never Used  Vaping Use  . Vaping Use: Never used  Substance and Sexual Activity  . Alcohol use: No  . Drug use: No  . Sexual activity: Yes    Partners: Female  Other Topics Concern  . Not on file  Social History Narrative   Lives with wife in a 5 story retirement home.  He lives on the first floor.  4 year college degree.  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     Social Determinants of Health   Financial Resource Strain: Low Risk   . Difficulty of Paying Living Expenses: Not hard at all  Food Insecurity:   . Worried About Charity fundraiser in the Last Year: Not on file  . Ran Out of Food in the Last  Year: Not on file  Transportation Needs: No Transportation Needs  . Lack of Transportation (Medical): No  . Lack of Transportation (Non-Medical): No  Physical Activity:   . Days of Exercise per Week: Not on file  . Minutes of Exercise per Session: Not on file  Stress: No Stress Concern Present  . Feeling of Stress : Not at all  Social Connections: Unknown  . Frequency of Communication with Friends and Family: Not on file  . Frequency of Social Gatherings with Friends and Family: Not on file  . Attends Religious Services: Not on file  . Active Member of Clubs or Organizations: Not on file  . Attends Archivist Meetings: Not on file  . Marital Status: Married  Human resources officer Violence:   . Fear of Current or Ex-Partner: Not on file  . Emotionally Abused: Not on file  . Physically Abused: Not on file  . Sexually Abused: Not on file    Current Outpatient Medications  Medication Sig  Dispense Refill  . Calcium Carb-Cholecalciferol (CALCIUM 1000 + D PO) Take 1 tablet by mouth daily.     . Cholecalciferol (VITAMIN D3) 400 UNITS CAPS Take 2 capsules by mouth daily.     . Denosumab (PROLIA [place]) Inject into the skin every 6 (six) months.    [name] glucosamine-chondroitin 500-400 MG tablet Take 1 tablet by mouth daily.    [name] loratadine (CLARITIN) 10 MG tablet Take 10 mg by mouth daily as needed.     . Multiple Vitamins-Minerals (PRESERVISION AREDS PO) Take 2 capsules by mouth daily.    . naproxen (NAPROSYN) 250 MG tablet Take 250 mg by mouth as needed.    [name] omeprazole (PRILOSEC) 40 MG capsule TAKE 1 CAPSULE (40 MG TOTAL) BY MOUTH DAILY. IN THE MORNING 30 MINUTES PRIOR TO EATING 90 capsule 3  . pramoxine-hydrocortisone (PROCTOCREAM-HC) 1-1 % rectal cream Place 1 application rectally 2 (two) times daily. 30 g 0  . Ranibizumab (LUCENTIS IO) Inject into the eye as needed. As needed every three months.    . traZODone (DESYREL) 50 MG tablet Take 0.5 tablets (25 mg total) by mouth at bedtime. May increase to full tablet If needed 90 tablet 0   No current facility-administered medications for this visit.    Allergies  Allergen Reactions  . Kenalog [Triamcinolone Acetonide]     Blindness X 3 Days      Review of Systems:   General:  Reduced appetite and energy and some weight loss  Cardiac:  Endorses palpitations  Respiratory:  History of productive cough  GI:   History of difficulty swallowing, underwent colonoscopy in April 2021  GU:   History of increased frequency e  Vascular:  Endorses leg cramps,   Neuro:   No history of strokes or seizures  Musculoskeletal: Does have difficulty walking, reduced mobility   Skin:   Negative  Psych:    anxiety,  depression, and nervousness,   Eyes:   Positive blurry vision,  wears glasses   ENT:   Negative  Hematologic:  Negative  Endocrine:  No diabetes, does not check CBG's at home    Physical Exam:   BP 120/72 (BP Location: Right Arm,  Patient Position: Sitting, Cuff Size: Normal)   Pulse 71   Temp 97.9 F (36.6 C) (Skin)   Resp 20   Ht 6\' 4"  (1.93 m)   Wt 84.2 kg   SpO2 99% Comment: RA  BMI 22.59 kg/m   General:   well-appearing  HEENT:  Unremarkable   Neck:   no JVD, no bruits, no adenopathy   Chest:   clear to auscultation, symmetrical breath sounds, no wheezes, no rhonchi  CV:   RRR, no detectable murmur   Abdomen:  soft, non-tender, no masses  Extremities:  warm, well-perfused, pulses intact throughout, no LE edema  Rectal/GU  Deferred  Neuro:   Grossly non-focal and symmetrical throughout  Skin:   Clean and dry, no rashes, no breakdown   Diagnostic Tests:  I have personally reviewed his available imaging studies from 04/29/2020 and agree with their interpretation   Impression:  86 year old man with a 4.7 cm ascending aortic aneurysm it is unclear how long this has been present.  For all we know, given his relatively tall stature, this could represent a normal size aorta for him.   Plan:  Follow-up in 1 year with repeat CT scan Continue to follow blood pressure control   I spent in excess of 30 minutes during the conduct of this office consultation and >50% of this time involved direct face-to-face encounter with the patient for counseling and/or coordination of their care.          Level 3 Office Consult = 40 minutes         Level 4 Office Consult = 60 minutes         Level 5 Office Consult = 80 minutes  B.  Murvin Natal, MD 05/25/2020 10:49 AM

## 2020-06-02 ENCOUNTER — Other Ambulatory Visit: Payer: Self-pay | Admitting: Internal Medicine

## 2020-06-02 NOTE — Telephone Encounter (Signed)
Pt needs another refill of pramoxine-hydrocortisone (PROCTOCREAM-HC) 1-1 % rectal cream. Pt states that he needs it so soon because he can not see very well where he is putting the ointment so he is accidentally wasteful with it.

## 2020-06-03 DIAGNOSIS — G4733 Obstructive sleep apnea (adult) (pediatric): Secondary | ICD-10-CM | POA: Diagnosis not present

## 2020-06-03 DIAGNOSIS — R0602 Shortness of breath: Secondary | ICD-10-CM | POA: Diagnosis not present

## 2020-06-04 DIAGNOSIS — R0602 Shortness of breath: Secondary | ICD-10-CM | POA: Diagnosis not present

## 2020-06-04 DIAGNOSIS — G4733 Obstructive sleep apnea (adult) (pediatric): Secondary | ICD-10-CM | POA: Diagnosis not present

## 2020-06-09 DIAGNOSIS — H353211 Exudative age-related macular degeneration, right eye, with active choroidal neovascularization: Secondary | ICD-10-CM | POA: Diagnosis not present

## 2020-06-09 DIAGNOSIS — H31093 Other chorioretinal scars, bilateral: Secondary | ICD-10-CM | POA: Diagnosis not present

## 2020-06-09 DIAGNOSIS — H35433 Paving stone degeneration of retina, bilateral: Secondary | ICD-10-CM | POA: Diagnosis not present

## 2020-06-09 DIAGNOSIS — H353223 Exudative age-related macular degeneration, left eye, with inactive scar: Secondary | ICD-10-CM | POA: Diagnosis not present

## 2020-06-15 ENCOUNTER — Ambulatory Visit: Payer: PPO | Attending: Internal Medicine

## 2020-06-15 DIAGNOSIS — Z23 Encounter for immunization: Secondary | ICD-10-CM

## 2020-06-15 NOTE — Progress Notes (Signed)
   Covid-19 Vaccination Clinic  Name:  George Owens    MRN: [id number] DOB: 01/16/1932  06/15/2020  [name] was observed post Covid-19 immunization for 15 minutes without incident. He was provided with Vaccine Information Sheet and instruction to access the V-Safe system.   [name] was instructed to call 911 with any severe reactions post vaccine: [name] Difficulty breathing  . Swelling of face and throat  . A fast heartbeat  . A bad rash all over body  . Dizziness and weakness   Immunizations Administered    Name Date Dose VIS Date Route   Pfizer COVID-19 Vaccine 06/15/2020  1:21 PM 0.3 mL 04/08/2020 Intramuscular   Manufacturer: [employer], [employer]   Lot: [id number]   NDC: [address]-0

## 2020-06-20 DIAGNOSIS — J189 Pneumonia, unspecified organism: Secondary | ICD-10-CM

## 2020-06-20 HISTORY — DX: Pneumonia, unspecified organism: J18.9

## 2020-06-23 DIAGNOSIS — D225 Melanocytic nevi of trunk: Secondary | ICD-10-CM | POA: Diagnosis not present

## 2020-06-23 DIAGNOSIS — Z85828 Personal history of other malignant neoplasm of skin: Secondary | ICD-10-CM | POA: Diagnosis not present

## 2020-06-23 DIAGNOSIS — D2261 Melanocytic nevi of right upper limb, including shoulder: Secondary | ICD-10-CM | POA: Diagnosis not present

## 2020-06-23 DIAGNOSIS — X32XXXA Exposure to sunlight, initial encounter: Secondary | ICD-10-CM | POA: Diagnosis not present

## 2020-06-23 DIAGNOSIS — L570 Actinic keratosis: Secondary | ICD-10-CM | POA: Diagnosis not present

## 2020-06-23 DIAGNOSIS — D2262 Melanocytic nevi of left upper limb, including shoulder: Secondary | ICD-10-CM | POA: Diagnosis not present

## 2020-06-23 DIAGNOSIS — D2271 Melanocytic nevi of right lower limb, including hip: Secondary | ICD-10-CM | POA: Diagnosis not present

## 2020-07-01 ENCOUNTER — Ambulatory Visit (INDEPENDENT_AMBULATORY_CARE_PROVIDER_SITE_OTHER): Payer: PPO | Admitting: Gastroenterology

## 2020-07-01 ENCOUNTER — Encounter: Payer: Self-pay | Admitting: Gastroenterology

## 2020-07-01 VITALS — BP 144/72 | HR 81 | Temp 97.8°F | Ht 76.0 in | Wt 190.2 lb

## 2020-07-01 DIAGNOSIS — R194 Change in bowel habit: Secondary | ICD-10-CM | POA: Diagnosis not present

## 2020-07-01 DIAGNOSIS — D696 Thrombocytopenia, unspecified: Secondary | ICD-10-CM | POA: Diagnosis not present

## 2020-07-01 DIAGNOSIS — R140 Abdominal distension (gaseous): Secondary | ICD-10-CM

## 2020-07-01 NOTE — Progress Notes (Addendum)
Cephas Darby, MD [address]  [place]  [place], [place] [address]  Main: [phone]  Fax: [phone]    Gastroenterology Consultation  Referring Provider:     Crecencio Mc, MD Primary Care Physician:  Crecencio Mc, MD Primary Gastroenterologist:  Dr. Cephas Darby Reason for Consultation:     Altered bowel habits, abdominal bloating,         HPI:   George Owens is a 87 y.o. male referred by Dr. Crecencio Mc, MD  for consultation & management of altered bowel habits and abdominal bloating.  Patient reports that he has been experiencing abdominal bloating, particularly postprandial associated with variable stool consistency anywhere from normal caliber to pencil thin and sometimes hard.  He underwent colonoscopy in 4/21 which revealed polyps and diverticulosis of the sigmoid colon as well as internal hemorrhoids.  Patient also had rectal pain, was treated with hemorrhoid cream.  He is not concerned about rectal pain today.  Patient is also diagnosed with cirrhosis of liver based on CT chest and followed by ultrasound liver.  He does have new onset of mild thrombocytopenia, 142 in 4/21.  Patient underwent work-up for secondary liver disease, found to have ANA positive and elevated ferritin <500  NSAIDs: None  Antiplts/Anticoagulants/Anti thrombotics: None  GI Procedures:  Upper endoscopy 11/29/2018 - Normal esophagus. - No endoscopic esophageal abnormality to explain patient's dysphagia. Dilation attempted, but aborted due to resistance with Savary and known Zenker's diverticulum. - Multiple gastric polyps. Biopsied. - Normal examined duodenum.  Surgical [P], gastric polyp BX - HYPERPLASTIC POLYP. - WARTHIN-STARRY IS NEGATIVE FOR HELICOBACTER PYLORI. - NO INTESTINAL METAPLASIA, DYSPLASIA, OR MALIGNANCY.  Colonoscopy 10/09/2019 - Nine 3 to 6 mm polyps in the sigmoid colon, in the ascending colon and in the cecum, removed with a cold snare. Resected and  retrieved. - Diverticulosis in the sigmoid colon. - Non-bleeding internal hemorrhoids.  Diagnosis Surgical [P], colon, ascending and cecum, sigmoid, polyp (9) - TUBULAR ADENOMA(S) WITHOUT HIGH-GRADE DYSPLASIA OR MALIGNANCY - INFLAMMATORY POLYP(S)  Past Medical History:  Diagnosis Date  . Allergy   . Basal cell carcinoma    right temple Dr. Evorn Gong 12/2018  . Chicken pox   . Colon polyps   . COVID-19 virus infection 03/16/2020  . GERD (gastroesophageal reflux disease)   . history of prostate CA 2006  . Hyperlipidemia   . Leg cramps   . Neuropathy   . Osteoporosis     Past Surgical History:  Procedure Laterality Date  . CATARACT EXTRACTION Right   . CATARACT EXTRACTION W/PHACO Left 12/26/2017   Procedure: CATARACT EXTRACTION PHACO AND INTRAOCULAR LENS PLACEMENT ([facility])  COMPLICATED LEFT;  Surgeon: Leandrew Koyanagi, MD;  Location: [facility];  Service: Ophthalmology;  Laterality: Left;  [place]  . colonoscopy    . EYE MUSCLE SURGERY Left   . TONSILLECTOMY AND ADENOIDECTOMY      Current Outpatient Medications:  .  Calcium Carb-Cholecalciferol (CALCIUM 1000 + D PO), Take 1 tablet by mouth daily. , Disp: , Rfl:  .  Cholecalciferol (VITAMIN D3) 400 UNITS CAPS, Take 2 capsules by mouth daily. , Disp: , Rfl:  .  Denosumab (PROLIA [place]), Inject into the skin every 6 (six) months., Disp: , Rfl:  .  glucosamine-chondroitin 500-400 MG tablet, Take 1 tablet by mouth daily., Disp: , Rfl:  .  loratadine (CLARITIN) 10 MG tablet, Take 10 mg by mouth daily as needed. , Disp: , Rfl:  .  Multiple  Vitamins-Minerals (PRESERVISION AREDS PO), Take 2 capsules by mouth daily., Disp: , Rfl:  .  naproxen (NAPROSYN) 250 MG tablet, Take 250 mg by mouth as needed., Disp: , Rfl:  .  omeprazole (PRILOSEC) 40 MG capsule, TAKE 1 CAPSULE (40 MG TOTAL) BY MOUTH DAILY. IN THE MORNING 30 MINUTES PRIOR TO EATING, Disp: 90 capsule, Rfl: 3 .  Ranibizumab (LUCENTIS IO), Inject into the eye as  needed. As needed every three months., Disp: , Rfl:  .  traZODone (DESYREL) 50 MG tablet, Take 0.5 tablets (25 mg total) by mouth at bedtime. May increase to full tablet If needed, Disp: 90 tablet, Rfl: 0 .  pramoxine-hydrocortisone (PROCTOCREAM-HC) 1-1 % rectal cream, PLACE 1 APPLICATION RECTALLY 2 (TWO) TIMES DAILY. (Patient not taking: Reported on 07/01/2020), Disp: 30 g, Rfl: 0   Family History  Problem Relation Age of Onset  . Stroke Mother   . Arthritis Father   . Heart disease Father   . Brain cancer Brother        1/2 brother (possibly related to mother NOT related to patient)  . Colon cancer Neg Hx   . Esophageal cancer Neg Hx   . Pancreatic cancer Neg Hx   . Stomach cancer Neg Hx   . Liver disease Neg Hx      Social History   Tobacco Use  . Smoking status: Never Smoker  . Smokeless tobacco: Never Used  Vaping Use  . Vaping Use: Never used  Substance Use Topics  . Alcohol use: No  . Drug use: No    Allergies as of 07/01/2020 - Review Complete 07/01/2020  Allergen Reaction Noted  . Kenalog [triamcinolone acetonide]  12/13/2012    Review of Systems:    All systems reviewed and negative except where noted in HPI.   Physical Exam:  BP (!) 144/72 (BP Location: Left Arm, Patient Position: Sitting, Cuff Size: Normal)   Pulse 81   Temp 97.8 F (36.6 C) (Oral)   Ht 6\' 4"  (1.93 m)   Wt 190 lb 4 oz (86.3 kg)   BMI 23.16 kg/m  No LMP for male patient.  General:   Alert,  Well-developed, well-nourished, pleasant and cooperative in NAD Head:  Normocephalic and atraumatic. Eyes:  Sclera clear, no icterus.   Conjunctiva pink. Ears:  Normal auditory acuity. Nose:  No deformity, discharge, or lesions. Mouth:  No deformity or lesions,oropharynx pink & moist. Neck:  Supple; no masses or thyromegaly. Lungs:  Respirations even and unlabored.  Clear throughout to auscultation.   No wheezes, crackles, or rhonchi. No acute distress. Heart:  Regular rate and rhythm; no  murmurs, clicks, rubs, or gallops. Abdomen:  Normal bowel sounds. Soft, non-tender and non-distended without masses, hepatosplenomegaly or hernias noted.  No guarding or rebound tenderness.   Rectal: Not performed Msk:  Symmetrical without gross deformities. Good, equal movement & strength bilaterally. Pulses:  Normal pulses noted. Extremities:  No clubbing or edema.  No cyanosis. Neurologic:  Alert and oriented x3;  grossly normal neurologically. Skin:  Intact without significant lesions or rashes. No jaundice. Psych:  Alert and cooperative. Normal mood and affect.  Imaging Studies: Reviewed  Assessment and Plan:   George Owens is a 87 y.o. Caucasian male with no significant past medical history is seen in consultation for abdominal bloating and altered bowel habits, new diagnosis of cirrhosis  Abdomen bloating and altered bowel habits Patient already had a colonoscopy in 09/2019 which was unremarkable except for polyps and sigmoid diverticulosis and internal  hemorrhoids Recommend H. pylori breath test and treat if positive Discussed about possible dietary triggers and avoid lactose for 2 weeks  Cirrhosis of liver: Ultrasound liver revealed nodularity of the liver with no evidence of portal vein thrombosis.  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

## 2020-07-02 LAB — CBC
Hematocrit: 42.5 % (ref 37.5–51.0)
Hemoglobin: 14.1 g/dL (ref 13.0–17.7)
MCH: 30.1 pg (ref 26.6–33.0)
MCHC: 33.2 g/dL (ref 31.5–35.7)
MCV: 91 fL (ref 79–97)
Platelets: 161 10*3/uL (ref 150–450)
RBC: 4.68 x10E6/uL (ref 4.14–5.80)
RDW: 13.1 % (ref 11.6–15.4)
WBC: 5.8 10*3/uL (ref 3.4–10.8)

## 2020-07-02 LAB — H. PYLORI BREATH TEST: H pylori Breath Test: NEGATIVE

## 2020-07-06 ENCOUNTER — Telehealth: Payer: Self-pay

## 2020-07-06 NOTE — Telephone Encounter (Signed)
Good morning George Owens! This patient needs virtual visit to discuss his results with Dr. Derrel Nip.

## 2020-07-06 NOTE — Telephone Encounter (Signed)
Called and schedule pt for a VV 07/10/20

## 2020-07-06 NOTE — Telephone Encounter (Signed)
error 

## 2020-07-10 ENCOUNTER — Telehealth (INDEPENDENT_AMBULATORY_CARE_PROVIDER_SITE_OTHER): Payer: PPO | Admitting: Internal Medicine

## 2020-07-10 ENCOUNTER — Other Ambulatory Visit: Payer: Self-pay

## 2020-07-10 ENCOUNTER — Telehealth: Payer: PPO | Admitting: Internal Medicine

## 2020-07-10 ENCOUNTER — Encounter: Payer: Self-pay | Admitting: Internal Medicine

## 2020-07-10 DIAGNOSIS — I700 Atherosclerosis of aorta: Secondary | ICD-10-CM | POA: Diagnosis not present

## 2020-07-10 DIAGNOSIS — K7460 Unspecified cirrhosis of liver: Secondary | ICD-10-CM | POA: Diagnosis not present

## 2020-07-10 DIAGNOSIS — R198 Other specified symptoms and signs involving the digestive system and abdomen: Secondary | ICD-10-CM | POA: Diagnosis not present

## 2020-07-10 NOTE — Assessment & Plan Note (Signed)
Not resolved yet.  Now gaining weigh.  GI evaluation reviewed.  Follow up with Dr Marius Ditch at 3 months; advised to request anuscopy If rectal/anal pain still an issue

## 2020-07-10 NOTE — Patient Instructions (Signed)
Recommendations:  Lactase enzyme : take one tablet with any meal containing dairy  Hepatitis A/B vaccines recommended due to NASH/cirrhosis  Ask Dr Marius Ditch about having U/s and AFP (alfa feto protein) tumor marker done every 6 months  If rectal pain /thin stools still an issue,  Ask [place] about doing an anuscopy in office to rule out large hemorrhoid restricting passage   Blood pressure is very dynamic and will fluctuate with diet,  Anxiety,  Normal activities

## 2020-07-10 NOTE — Progress Notes (Signed)
Virtual Visit via [facility] Note  This visit type was conducted due to national recommendations for restrictions regarding the COVID-19 pandemic (e.g. social distancing).  This format is felt to be most appropriate for this patient at this time.  All issues noted in this document were discussed and addressed.  No physical exam was performed (except for noted visual exam findings with Video Visits).   I connected with@ on 07/10/20 at 10:00 AM EST by a video enabled telemedicine application  and verified that I am speaking with the correct person using two identifiers. Location patient: home Location provider: work or home office Persons participating in the virtual visit: patient, provider  I discussed the limitations, risks, security and privacy concerns of performing an evaluation and management service by telephone and the availability of in person appointments. I also discussed with the patient that there may be a patient responsible charge related to this service. The patient expressed understanding and agreed to proceed.   Reason for visit: follow up on multiple issues   HPI:  87 yr old male with recently discovered cirrhosis, non alcoholic,  TAA (4.7 cm) , both found during workup for altered bowel movements and unintentional weight loss following uncomplicated COVID infection , presents for follow up on all issues  1) altered BMs/ non alcoholic cirrhosis:  Recent GI evaluation reviewed, including additional labs drawn.  Patient has  Noted an improvement in constipation and bloating but not complete resolutions.  He has gained 5 lbs since last visit.  Continues to have rectal pain and thin stools with each urinary void,  Following a normal caliber stool .  Has not avoided dairy:  Uses cheese as protein source,  Drinks almond milk daily for calcium.  Discussed trial of lactase enzyme . Discussed follow up on cirrhosis which typically includes vaccination against Hep A/B,  6 month serial  ultrasound and AFP tumor marker,  Metformin and statin.  Drug therapy deferred for now.  Encouraged to keep 3 month follow up with Dr Marius Ditch   2) TAA:  Advised that his 4.7 cm aorta may be normal for a man of his height.  Annual follow up and control of BP advised.[name]  3) GAD:  Advised by surgery to stop looking for more medical issues .  Sentiments reinforced today.  He voices dismay that his BP of 976 systolic at the GI office was not addressed; home readings and other office readings have been normal and < 734 systolic   4) QOL:  He appears less anxious ,  Has resumed exercising, and is gaining weight   ROS: See pertinent positives and negatives per HPI.  Past Medical History:  Diagnosis Date  . Allergy   . Basal cell carcinoma    right temple Dr. Evorn Gong 12/2018  . Chicken pox   . Colon polyps   . COVID-19 virus infection 03/16/2020  . GERD (gastroesophageal reflux disease)   . history of prostate CA 2006  . Hyperlipidemia   . Leg cramps   . Neuropathy   . Osteoporosis     Past Surgical History:  Procedure Laterality Date  . CATARACT EXTRACTION Right   . CATARACT EXTRACTION W/PHACO Left 12/26/2017   Procedure: CATARACT EXTRACTION PHACO AND INTRAOCULAR LENS PLACEMENT ([facility])  COMPLICATED LEFT;  Surgeon: Leandrew Koyanagi, MD;  Location: [facility];  Service: Ophthalmology;  Laterality: Left;  [place]  . colonoscopy    . EYE MUSCLE SURGERY Left   . TONSILLECTOMY AND ADENOIDECTOMY  Family History  Problem Relation Age of Onset  . Stroke Mother   . Arthritis Father   . Heart disease Father   . Brain cancer Brother        1/2 brother (possibly related to mother NOT related to patient)  . Colon cancer Neg Hx   . Esophageal cancer Neg Hx   . Pancreatic cancer Neg Hx   . Stomach cancer Neg Hx   . Liver disease Neg Hx     SOCIAL HX:  reports that he has never smoked. He has never used smokeless tobacco. He reports that he does not drink alcohol and  does not use drugs.  Current Outpatient Medications:  .  Calcium Carb-Cholecalciferol (CALCIUM 1000 + D PO), Take 1 tablet by mouth daily. , Disp: , Rfl:  .  Cholecalciferol (VITAMIN D3) 400 UNITS CAPS, Take 2 capsules by mouth daily. , Disp: , Rfl:  .  Denosumab (PROLIA [place]), Inject into the skin every 6 (six) months., Disp: , Rfl:  .  glucosamine-chondroitin 500-400 MG tablet, Take 1 tablet by mouth daily., Disp: , Rfl:  .  loratadine (CLARITIN) 10 MG tablet, Take 10 mg by mouth daily as needed. , Disp: , Rfl:  .  naproxen (NAPROSYN) 250 MG tablet, Take 250 mg by mouth as needed., Disp: , Rfl:  .  omeprazole (PRILOSEC) 40 MG capsule, TAKE 1 CAPSULE (40 MG TOTAL) BY MOUTH DAILY. IN THE MORNING 30 MINUTES PRIOR TO EATING, Disp: 90 capsule, Rfl: 3 .  Ranibizumab (LUCENTIS IO), Inject into the eye as needed. As needed every three months., Disp: , Rfl:  .  traZODone (DESYREL) 50 MG tablet, Take 0.5 tablets (25 mg total) by mouth at bedtime. May increase to full tablet If needed, Disp: 90 tablet, Rfl: 0 .  pramoxine-hydrocortisone (PROCTOCREAM-HC) 1-1 % rectal cream, PLACE 1 APPLICATION RECTALLY 2 (TWO) TIMES DAILY. (Patient not taking: No sig reported), Disp: 30 g, Rfl: 0  EXAM:  VITALS per patient if applicable:  GENERAL: alert, oriented, appears well and in no acute distress  HEENT: atraumatic, conjunttiva clear, no obvious abnormalities on inspection of external nose and ears  NECK: normal movements of the head and neck  LUNGS: on inspection no signs of respiratory distress, breathing rate appears normal, no obvious gross SOB, gasping or wheezing  CV: no obvious cyanosis  MS: moves all visible extremities without noticeable abnormality  PSYCH/NEURO: pleasant and cooperative, no obvious depression or anxiety, speech and thought processing grossly intact  ASSESSMENT AND PLAN:  Discussed the following assessment and plan:  Change in bowel function  Cirrhosis of liver without  ascites, unspecified hepatic cirrhosis type (HCC)  Thoracic aortic atherosclerosis ([place])  Change in bowel function Not resolved yet.  Now gaining weigh.  GI evaluation reviewed.  Follow up with Dr Marius Ditch at 3 months; advised to request anuscopy If rectal/anal pain still an issue  Cirrhosis of liver ([place]) Compensated.  Return to GI for follow up. Hep A/B vaccines recommended , along with biannual surveillance with U/s and AFP tumor marker   Thoracic aortic atherosclerosis ([place]) Referred to Vascular Surgery.  Annual follow up with CT advised.  He has not history of hypertension     I discussed the assessment and treatment plan with the patient. The patient was provided an opportunity to ask questions and all were answered. The patient agreed with the plan and demonstrated an understanding of the instructions.   The patient was advised to call back or seek an in-person evaluation  if the symptoms worsen or if the condition fails to improve as anticipated.  I spent 40  Minutes  dedicated to the care of this patient on the date of this encounter to include pre-visit review of his medical history,  Face-to-face time with the patient , and post visit ordering of testing and therapeutics.  Crecencio Mc, MD

## 2020-07-10 NOTE — Assessment & Plan Note (Signed)
Referred to Vascular Surgery.  Annual follow up with CT advised.  He has not history of hypertension

## 2020-07-10 NOTE — Assessment & Plan Note (Signed)
Compensated.  Return to GI for follow up. Hep A/B vaccines recommended , along with biannual surveillance with U/s and AFP tumor marker

## 2020-07-13 ENCOUNTER — Other Ambulatory Visit: Payer: Self-pay

## 2020-07-13 ENCOUNTER — Ambulatory Visit (INDEPENDENT_AMBULATORY_CARE_PROVIDER_SITE_OTHER): Payer: PPO

## 2020-07-13 DIAGNOSIS — Z23 Encounter for immunization: Secondary | ICD-10-CM | POA: Diagnosis not present

## 2020-07-13 NOTE — Progress Notes (Signed)
Patient presented for Twinrix injection to right deltoid, patient voiced no concerns nor showed any signs of distress during injection. 

## 2020-07-17 DIAGNOSIS — H353223 Exudative age-related macular degeneration, left eye, with inactive scar: Secondary | ICD-10-CM | POA: Diagnosis not present

## 2020-07-17 DIAGNOSIS — H43813 Vitreous degeneration, bilateral: Secondary | ICD-10-CM | POA: Diagnosis not present

## 2020-07-17 DIAGNOSIS — H353211 Exudative age-related macular degeneration, right eye, with active choroidal neovascularization: Secondary | ICD-10-CM | POA: Diagnosis not present

## 2020-07-17 DIAGNOSIS — H31093 Other chorioretinal scars, bilateral: Secondary | ICD-10-CM | POA: Diagnosis not present

## 2020-08-03 ENCOUNTER — Other Ambulatory Visit: Payer: Self-pay | Admitting: Internal Medicine

## 2020-08-10 ENCOUNTER — Telehealth: Payer: Self-pay | Admitting: *Deleted

## 2020-08-10 NOTE — Telephone Encounter (Signed)
Mr. Gladu called with concerns of a f/u appt with Dr. Orvan Seen. Per Dr. Orvan Seen office visit note from 05/18/20, pt to f/u with CT of chest in one year. Pt advised to maintain healthy BP. Currently pt states systolic BP ranges from 734-037. No further questions at this time.

## 2020-08-12 ENCOUNTER — Telehealth: Payer: Self-pay | Admitting: Internal Medicine

## 2020-08-12 NOTE — Telephone Encounter (Signed)
Left a message to call back.

## 2020-08-12 NOTE — Telephone Encounter (Signed)
Patient called in wanted to know if Dr.Tullo could help him get some information on his office visit from Dr.Broadus he called the office of Dr.Broadus and could not get any information about his aneurysum

## 2020-08-13 NOTE — Telephone Encounter (Signed)
George Owens returned the call to go over the situation. Patient saw Dr. Fredrich Romans at [facility] in November of 2021. Patient is concerned over the lack of information given to him and the lack of information about his Aneurysm. Pt states that he was with his wife and both he and his wife were told to return in 6 months but had not yet received a call. When calling back to their office they spoke to a nurse who told them to come back in a year. The nurse did not make an appointment with them. They state that they were not given an After Visit Summary, nor were they given one through mychart and they can not access their visit on mychart either. George Owens is very concerned and asked if Our office new anything about his visit with Dr. Orvan Seen. I informed him that he should contact their office and request to know when his next appointment was or if he can be mailed the after visit summary from that encounter. George Owens declined and states that he did not want to cause an issue with that practice and asks if there was anything we could do, or if we could mail him his after visit summary from that visit.

## 2020-08-13 NOTE — Telephone Encounter (Signed)
Called and spoke to Kerin Salen, RN and she informed me that we can not send another clinics AVS and Xaden will have to contact Dr.Atkins office again and ask for more information and to schedule for further appointments. Pasqualino verbalized understanding and had no further questions.

## 2020-08-14 ENCOUNTER — Ambulatory Visit: Payer: PPO

## 2020-08-19 ENCOUNTER — Ambulatory Visit (INDEPENDENT_AMBULATORY_CARE_PROVIDER_SITE_OTHER): Payer: PPO

## 2020-08-19 ENCOUNTER — Other Ambulatory Visit: Payer: Self-pay

## 2020-08-19 DIAGNOSIS — Z23 Encounter for immunization: Secondary | ICD-10-CM | POA: Diagnosis not present

## 2020-08-19 NOTE — Progress Notes (Signed)
[name] presents today for injection per MD orders. 2nd TwinRix administered IM in left Upper Arm. Administration without incident. Patient tolerated well.  Zaynab Chipman,cma

## 2020-09-09 ENCOUNTER — Encounter: Payer: Self-pay | Admitting: Internal Medicine

## 2020-09-09 ENCOUNTER — Ambulatory Visit (INDEPENDENT_AMBULATORY_CARE_PROVIDER_SITE_OTHER): Payer: PPO | Admitting: Internal Medicine

## 2020-09-09 ENCOUNTER — Other Ambulatory Visit: Payer: Self-pay

## 2020-09-09 ENCOUNTER — Ambulatory Visit: Payer: PPO

## 2020-09-09 ENCOUNTER — Telehealth: Payer: Self-pay | Admitting: Internal Medicine

## 2020-09-09 VITALS — BP 130/80 | HR 67 | Temp 97.5°F | Ht 76.0 in | Wt 189.2 lb

## 2020-09-09 DIAGNOSIS — H6123 Impacted cerumen, bilateral: Secondary | ICD-10-CM | POA: Diagnosis not present

## 2020-09-09 DIAGNOSIS — K219 Gastro-esophageal reflux disease without esophagitis: Secondary | ICD-10-CM | POA: Diagnosis not present

## 2020-09-09 DIAGNOSIS — I712 Thoracic aortic aneurysm, without rupture: Secondary | ICD-10-CM

## 2020-09-09 DIAGNOSIS — R6881 Early satiety: Secondary | ICD-10-CM | POA: Diagnosis not present

## 2020-09-09 DIAGNOSIS — I7121 Aneurysm of the ascending aorta, without rupture: Secondary | ICD-10-CM

## 2020-09-09 DIAGNOSIS — K225 Diverticulum of esophagus, acquired: Secondary | ICD-10-CM | POA: Diagnosis not present

## 2020-09-09 DIAGNOSIS — K8689 Other specified diseases of pancreas: Secondary | ICD-10-CM | POA: Diagnosis not present

## 2020-09-09 DIAGNOSIS — R1030 Lower abdominal pain, unspecified: Secondary | ICD-10-CM

## 2020-09-09 DIAGNOSIS — K7460 Unspecified cirrhosis of liver: Secondary | ICD-10-CM

## 2020-09-09 DIAGNOSIS — R0602 Shortness of breath: Secondary | ICD-10-CM | POA: Diagnosis not present

## 2020-09-09 NOTE — Patient Instructions (Addendum)
Align prebiotic/probiotic Debrox ear wax drops   Gastroesophageal Reflux Disease, Adult Gastroesophageal reflux (GER) happens when acid from the stomach flows up into the tube that connects the mouth and the stomach (esophagus). Normally, food travels down the esophagus and stays in the stomach to be digested. However, when a person has GER, food and stomach acid sometimes move back up into the esophagus. If this becomes a more serious problem, the person may be diagnosed with a disease called gastroesophageal reflux disease (GERD). GERD occurs when the reflux:  Happens often.  Causes frequent or severe symptoms.  Causes problems such as damage to the esophagus. When stomach acid comes in contact with the esophagus, the acid may cause inflammation in the esophagus. Over time, GERD may create small holes (ulcers) in the lining of the esophagus. What are the causes? This condition is caused by a problem with the muscle between the esophagus and the stomach (lower esophageal sphincter, or LES). Normally, the LES muscle closes after food passes through the esophagus to the stomach. When the LES is weakened or abnormal, it does not close properly, and that allows food and stomach acid to go back up into the esophagus. The LES can be weakened by certain dietary substances, medicines, and medical conditions, including:  Tobacco use.  Pregnancy.  Having a hiatal hernia.  Alcohol use.  Certain foods and beverages, such as coffee, chocolate, onions, and peppermint. What increases the risk? You are more likely to develop this condition if you:  Have an increased body weight.  Have a connective tissue disorder.  Take NSAIDs, such as ibuprofen. What are the signs or symptoms? Symptoms of this condition include:  Heartburn.  Difficult or painful swallowing and the feeling of having a lump in the throat.  A bitter taste in the mouth.  Bad breath and having a large amount of  saliva.  Having an upset or bloated stomach and belching.  Chest pain. Different conditions can cause chest pain. Make sure you see your health care provider if you experience chest pain.  Shortness of breath or wheezing.  Ongoing (chronic) cough or a nighttime cough.  Wearing away of tooth enamel.  Weight loss. How is this diagnosed? This condition may be diagnosed based on a medical history and a physical exam. To determine if you have mild or severe GERD, your health care provider may also monitor how you respond to treatment. You may also have tests, including:  A test to examine your stomach and esophagus with a small camera (endoscopy).  A test that measures the acidity level in your esophagus.  A test that measures how much pressure is on your esophagus.  A barium swallow or modified barium swallow test to show the shape, size, and functioning of your esophagus. How is this treated? Treatment for this condition may vary depending on how severe your symptoms are. Your health care provider may recommend:  Changes to your diet.  Medicine.  Surgery. The goal of treatment is to help relieve your symptoms and to prevent complications. Follow these instructions at home: Eating and drinking  Follow a diet as recommended by your health care provider. This may involve avoiding foods and drinks such as: ? Coffee and tea, with or without caffeine. ? Drinks that contain alcohol. ? Energy drinks and sports drinks. ? Carbonated drinks or sodas. ? Chocolate and cocoa. ? Peppermint and mint flavorings. ? Garlic and onions. ? Horseradish. ? Spicy and acidic foods, including peppers, chili powder, curry powder,  vinegar, hot sauces, and barbecue sauce. ? Citrus fruit juices and citrus fruits, such as oranges, lemons, and limes. ? Tomato-based foods, such as red sauce, chili, salsa, and pizza with red sauce. ? Fried and fatty foods, such as donuts, french fries, potato chips, and  high-fat dressings. ? High-fat meats, such as hot dogs and fatty cuts of red and white meats, such as rib eye steak, sausage, ham, and bacon. ? High-fat dairy items, such as whole milk, butter, and cream cheese.  Eat small, frequent meals instead of large meals.  Avoid drinking large amounts of liquid with your meals.  Avoid eating meals during the 2-3 hours before bedtime.  Avoid lying down right after you eat.  Do not exercise right after you eat.   Lifestyle  Do not use any products that contain nicotine or tobacco. These products include cigarettes, chewing tobacco, and vaping devices, such as e-cigarettes. If you need help quitting, ask your health care provider.  Try to reduce your stress by using methods such as yoga or meditation. If you need help reducing stress, ask your health care provider.  If you are overweight, reduce your weight to an amount that is healthy for you. Ask your health care provider for guidance about a safe weight loss goal.   General instructions  Pay attention to any changes in your symptoms.  Take over-the-counter and prescription medicines only as told by your health care provider. Do not take aspirin, ibuprofen, or other NSAIDs unless your health care provider told you to take these medicines.  Wear loose-fitting clothing. Do not wear anything tight around your waist that causes pressure on your abdomen.  Raise (elevate) the head of your bed about 6 inches (15 cm). You can use a wedge to do this.  Avoid bending over if this makes your symptoms worse.  Keep all follow-up visits. This is important. Contact a health care provider if:  You have: ? New symptoms. ? Unexplained weight loss. ? Difficulty swallowing or it hurts to swallow. ? Wheezing or a persistent cough. ? A hoarse voice.  Your symptoms do not improve with treatment. Get help right away if:  You have sudden pain in your arms, neck, jaw, teeth, or back.  You suddenly feel  sweaty, dizzy, or light-headed.  You have chest pain or shortness of breath.  You vomit and the vomit is green, yellow, or black, or it looks like blood or coffee grounds.  You faint.  You have stool that is red, bloody, or black.  You cannot swallow, drink, or eat. These symptoms may represent a serious problem that is an emergency. Do not wait to see if the symptoms will go away. Get medical help right away. Call your local emergency services (911 in the [place] Do not drive yourself to the hospital. Summary  Gastroesophageal reflux happens when acid from the stomach flows up into the esophagus. GERD is a disease in which the reflux happens often, causes frequent or severe symptoms, or causes problems such as damage to the esophagus.  Treatment for this condition may vary depending on how severe your symptoms are. Your health care provider may recommend diet and lifestyle changes, medicine, or surgery.  Contact a health care provider if you have new or worsening symptoms.  Take over-the-counter and prescription medicines only as told by your health care provider. Do not take aspirin, ibuprofen, or other NSAIDs unless your health care provider told you to do so.  Keep all follow-up visits as  told by your health care provider. This is important. This information is not intended to replace advice given to you by your health care provider. Make sure you discuss any questions you have with your health care provider. Document Revised: 12/16/2019 Document Reviewed: 12/16/2019 Elsevier Patient Education  2021 [place] for Gastroesophageal Reflux Disease, Adult When you have gastroesophageal reflux disease (GERD), the foods you eat and your eating habits are very important. Choosing the right foods can help ease the discomfort of GERD. Consider working with a dietitian to help you make healthy food choices. What are tips for following this plan? Reading food labels  Look  for foods that are low in saturated fat. Foods that have less than 5% of daily value (DV) of fat and 0 g of trans fats may help with your symptoms. Cooking  Cook foods using methods other than frying. This may include baking, steaming, grilling, or broiling. These are all methods that do not need a lot of fat for cooking.  To add flavor, try to use herbs that are low in spice and acidity. Meal planning  Choose healthy foods that are low in fat, such as fruits, vegetables, whole grains, low-fat dairy products, lean meats, fish, and poultry.  Eat frequent, small meals instead of three large meals each day. Eat your meals slowly, in a relaxed setting. Avoid bending over or lying down until 2-3 hours after eating.  Limit high-fat foods such as fatty meats or fried foods.  Limit your intake of fatty foods, such as oils, butter, and shortening.  Avoid the following as told by your health care provider: ? Foods that cause symptoms. These may be different for different people. Keep a food diary to keep track of foods that cause symptoms. ? Alcohol. ? Drinking large amounts of liquid with meals. ? Eating meals during the 2-3 hours before bed.   Lifestyle  Maintain a healthy weight. Ask your health care provider what weight is healthy for you. If you need to lose weight, work with your health care provider to do so safely.  Exercise for at least 30 minutes on 5 or more days each week, or as told by your health care provider.  Avoid wearing clothes that fit tightly around your waist and chest.  Do not use any products that contain nicotine or tobacco. These products include cigarettes, chewing tobacco, and vaping devices, such as e-cigarettes. If you need help quitting, ask your health care provider.  Sleep with the head of your bed raised. Use a wedge under the mattress or blocks under the bed frame to raise the head of the bed.  Chew sugar-free gum after mealtimes. What foods should I  eat? Eat a healthy, well-balanced diet of fruits, vegetables, whole grains, low-fat dairy products, lean meats, fish, and poultry. Each person is different. Foods that may trigger symptoms in one person may not trigger any symptoms in another person. Work with your health care provider to identify foods that are safe for you. The items listed above may not be a complete list of recommended foods and beverages. Contact a dietitian for more information.   What foods should I avoid? Limiting some of these foods may help manage the symptoms of GERD. Everyone is different. Consult a dietitian or your health care provider to help you identify the exact foods to avoid, if any. Fruits Any fruits prepared with added fat. Any fruits that cause symptoms. For some people this may include citrus fruits,  such as oranges, grapefruit, pineapple, and lemons. Vegetables Deep-fried vegetables. [place] fries. Any vegetables prepared with added fat. Any vegetables that cause symptoms. For some people, this may include tomatoes and tomato products, chili peppers, onions and garlic, and horseradish. Grains Pastries or quick breads with added fat. Meats and other proteins High-fat meats, such as fatty beef or pork, hot dogs, ribs, ham, sausage, salami, and bacon. Fried meat or protein, including fried fish and fried chicken. Nuts and nut butters, in large amounts. Dairy Whole milk and chocolate milk. Sour cream. Cream. Ice cream. Cream cheese. Milkshakes. Fats and oils Butter. Margarine. Shortening. Ghee. Beverages Coffee and tea, with or without caffeine. Carbonated beverages. Sodas. Energy drinks. Fruit juice made with acidic fruits, such as orange or grapefruit. Tomato juice. Alcoholic drinks. Sweets and desserts Chocolate and cocoa. Donuts. Seasonings and condiments Pepper. Peppermint and spearmint. Added salt. Any condiments, herbs, or seasonings that cause symptoms. For some people, this may include curry, hot  sauce, or vinegar-based salad dressings. The items listed above may not be a complete list of foods and beverages to avoid. Contact a dietitian for more information. Questions to ask your health care provider Diet and lifestyle changes are usually the first steps that are taken to manage symptoms of GERD. If diet and lifestyle changes do not improve your symptoms, talk with your health care provider about taking medicines. Where to find more information  International Foundation for Gastrointestinal Disorders: aboutgerd.org Summary  When you have gastroesophageal reflux disease (GERD), food and lifestyle choices may be very helpful in easing the discomfort of GERD.  Eat frequent, small meals instead of three large meals each day. Eat your meals slowly, in a relaxed setting. Avoid bending over or lying down until 2-3 hours after eating.  Limit high-fat foods such as fatty meats or fried foods. This information is not intended to replace advice given to you by your health care provider. Make sure you discuss any questions you have with your health care provider. Document Revised: 12/16/2019 Document Reviewed: 12/16/2019 Elsevier Patient Education  [place].

## 2020-09-09 NOTE — Telephone Encounter (Signed)
Noted  

## 2020-09-09 NOTE — Telephone Encounter (Signed)
Patient put on schedule for today at 2 pm for bad acid reflux stomach pain

## 2020-09-09 NOTE — Telephone Encounter (Signed)
[facility] Patient Name: George Owens Gender: Male DOB: 03/04/1932 Age: 87 Y 36 M 6 D Return Phone Number: [phone] (Primary) Address: City/State/Zip: [place] [address] Client [facility] Client Site [place] - Day Physician Deborra Medina - MD Contact Type Call Who Is Calling Patient / Member / Family / Caregiver Call Type Triage / Clinical Relationship To Patient Self Return Phone Number [phone] (Primary) Chief Complaint Abdominal Pain Reason for Call Symptomatic / Request for Health Information Initial Comment Transferred from office, PT is having severe acid reflux for a few days and abdominal pain after meals. Translation No Nurse Assessment Nurse: Rock Nephew, RN, Juliann Pulse Date/Time (Eastern Time): 09/09/2020 8:46:11 AM Confirm and document reason for call. If symptomatic, describe symptoms. ---Transferred from office, PT is having severe acid reflux , present for a few days and abdominal pain after meals. He reports no pain at all right now. Does the patient have any new or worsening symptoms? ---Yes Will a triage be completed? ---Yes Related visit to physician within the last 2 weeks? ---No Does the PT have any chronic conditions? (i.e. diabetes, asthma, this includes High risk factors for pregnancy, etc.) ---Yes List chronic conditions. ---cirrhosis of liver, acid reflux Is this a behavioral health or substance abuse call? ---No Guidelines Guideline Title Affirmed Question Affirmed Notes Nurse Date/Time [name] Time) Abdominal Pain - Upper [1] MODERATE pain (e.g., interferes with normal activities) AND [2] comes and goes (cramps) AND [3] present > 24 hours (Exception: pain with Vomiting or Diarrhea - see that Guideline) Rock Nephew, RN, Juliann Pulse 09/09/2020 8:47:05 AM Disp. Time [name] Time) Disposition Final  User PLEASE NOTE: All timestamps contained within this report are represented as [place] Standard Time. CONFIDENTIALTY NOTICE: This fax transmission is intended only for the addressee. It contains information that is legally privileged, confidential or otherwise protected from use or disclosure. If you are not the intended recipient, you are strictly prohibited from reviewing, disclosing, copying using or disseminating any of this information or taking any action in reliance on or regarding this information. If you have received this fax in error, please notify us immediately by telephone so that we can arrange for its return to [place]. Phone: [phone], Toll-Free: [phone], Fax: [phone] Page: 2 of 2 Call Id: [id number] 09/09/2020 8:50:36 AM See PCP within 24 Hours Yes Rock Nephew, RN, Gara Kroner Disagree/Comply Comply Caller Understands Yes PreDisposition Call Doctor Care Advice Given Per Guideline SEE PCP WITHIN 24 HOURS: * IF OFFICE WILL BE OPEN: You need to be examined within the next 24 hours. Call your doctor (or NP/PA) when the office opens and make an appointment. DRINK CLEAR FLUIDS: * Drink clear fluids only (e.g., water, flat soft drinks or half-strength Gatorade). * Sip small amounts at a time, until you feel better and the pain is gone. * Then slowly return to a regular diet. CALL BACK IF: * Severe pain present over 1 hour * Constant pain present over 2 hours * You become worse CARE ADVICE given per Abdominal Pain, Upper (Adult) guideline.

## 2020-09-09 NOTE — Progress Notes (Signed)
Chief Complaint  Patient presents with  . GI Problem  . Gastroesophageal Reflux   F/u  1. C/o GERD worsening, early satiety, bloating, lower ab pain after eating x 10-12 days with h/o constipation but better this happened after eating BLT sandwich. He has been on prilosec x 40 mg x 5 years. He does silk milk, min. Ice cream at night and activia yogurt x 3 months small amount. He has lost 10 lbs w/in the last 1 year  Barium swallow 08/17/18 +zenkers diverticulum and neg otherwise   2. [place] + cirrhosis hepatitis labs negative and currently getting twinrix vaccines   3. Increased wax in ears  4. 04/29/20 CT +4.7 cm ascending aortic aneurysm   Review of Systems  Constitutional: Positive for weight loss.  HENT: Negative for hearing loss.        +ear wax  Eyes: Negative for blurred vision.  Respiratory: Negative for shortness of breath.   Cardiovascular: Negative for chest pain.  Gastrointestinal: Positive for abdominal pain and heartburn. Negative for constipation.       +ab bloating   Skin: Negative for rash.   Past Medical History:  Diagnosis Date  . Allergy   . Basal cell carcinoma    right temple Dr. Evorn Gong 12/2018  . Chicken pox   . Colon polyps   . COVID-19 virus infection 03/16/2020  . GERD (gastroesophageal reflux disease)   . history of prostate CA 2006  . Hyperlipidemia   . Leg cramps   . Neuropathy   . Osteoporosis    Past Surgical History:  Procedure Laterality Date  . CATARACT EXTRACTION Right   . CATARACT EXTRACTION W/PHACO Left 12/26/2017   Procedure: CATARACT EXTRACTION PHACO AND INTRAOCULAR LENS PLACEMENT ([place])  COMPLICATED LEFT;  Surgeon: Leandrew Koyanagi, MD;  Location: [place];  Service: Ophthalmology;  Laterality: Left;  [place]  . colonoscopy    . EYE MUSCLE SURGERY Left   . TONSILLECTOMY AND ADENOIDECTOMY     Family History  Problem Relation Age of Onset  . Stroke Mother   . Arthritis Father   . Heart disease Father   .  Brain cancer Brother        1/2 brother (possibly related to mother NOT related to patient)  . Colon cancer Neg Hx   . Esophageal cancer Neg Hx   . Pancreatic cancer Neg Hx   . Stomach cancer Neg Hx   . Liver disease Neg Hx    Social History   Socioeconomic History  . Marital status: Married    Spouse name: Eloise  . Number of children: 4  . Years of education: Not on file  . Highest education level: Not on file  Occupational History  . Occupation: retired  Tobacco Use  . Smoking status: Never Smoker  . Smokeless tobacco: Never Used  Vaping Use  . Vaping Use: Never used  Substance and Sexual Activity  . Alcohol use: No  . Drug use: No  . Sexual activity: Yes    Partners: Female  Other Topics Concern  . Not on file  Social History Narrative   Lives with wife in a 5 story retirement home.  He lives on the first floor.  4 year college degree.  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     Social Determinants of Health   Financial Resource Strain: Low Risk   . Difficulty of Paying Living Expenses: Not hard at all  Food Insecurity: Not on  file  Transportation Needs: No Transportation Needs  . Lack of Transportation (Medical): No  . Lack of Transportation (Non-Medical): No  Physical Activity: Not on file  Stress: No Stress Concern Present  . Feeling of Stress : Not at all  Social Connections: Unknown  . Frequency of Communication with Friends and Family: Not on file  . Frequency of Social Gatherings with Friends and Family: Not on file  . Attends Religious Services: Not on file  . Active Member of Clubs or Organizations: Not on file  . Attends Archivist Meetings: Not on file  . Marital Status: Married  Human resources officer Violence: Not on file   No outpatient medications have been marked as taking for the 09/09/20 encounter (Office Visit) with McLean-Scocuzza, Nino Glow, MD.   Allergies  Allergen Reactions  . Kenalog [Triamcinolone  Acetonide]     Blindness X 3 Days   Recent Results (from the past 2160 hour(s))  H. pylori breath test     Status: None   Collection Time: 07/01/20  2:19 PM  Result Value Ref Range   H pylori Breath Test Negative Negative  CBC     Status: None   Collection Time: 07/01/20  2:19 PM  Result Value Ref Range   WBC 5.8 3.4 - 10.8 x10E3/uL   RBC 4.68 4.14 - 5.80 x10E6/uL   Hemoglobin 14.1 13.0 - 17.7 g/dL   Hematocrit 42.5 37.5 - 51.0 %   MCV 91 79 - 97 fL   MCH 30.1 26.6 - 33.0 pg   MCHC 33.2 31.5 - 35.7 g/dL   RDW 13.1 11.6 - 15.4 %   Platelets 161 150 - 450 x10E3/uL   Objective  Body mass index is 23.03 kg/m. Wt Readings from Last 3 Encounters:  09/09/20 189 lb 3.2 oz (85.8 kg)  07/10/20 190 lb (86.2 kg)  07/01/20 190 lb 4 oz (86.3 kg)   Temp Readings from Last 3 Encounters:  09/09/20 (!) 97.5 F (36.4 C) (Oral)  07/10/20 (!) 96.4 F (35.8 C)  07/01/20 97.8 F (36.6 C) (Oral)   BP Readings from Last 3 Encounters:  09/09/20 130/80  07/10/20 118/62  07/01/20 (!) 144/72   Pulse Readings from Last 3 Encounters:  09/09/20 67  07/01/20 81  05/18/20 71    Physical Exam Vitals and nursing note reviewed.  Constitutional:      Appearance: Normal appearance. He is well-developed and well-groomed.  HENT:     Head: Normocephalic and atraumatic.     Right Ear: There is impacted cerumen.     Left Ear: There is impacted cerumen.  Cardiovascular:     Rate and Rhythm: Normal rate and regular rhythm.     Heart sounds: Normal heart sounds.  Abdominal:     General: There is distension.     Tenderness: There is no abdominal tenderness.     Comments: Mild ab bloating   Skin:    General: Skin is warm and moist.  Neurological:     General: No focal deficit present.     Mental Status: He is alert and oriented to person, place, and time.     Gait: Gait normal.  Psychiatric:        Attention and Perception: Attention and perception normal.        Mood and Affect: Mood and  affect normal.        Speech: Speech normal.        Behavior: Behavior normal. Behavior is cooperative.  Thought Content: Thought content normal.        Cognition and Memory: Cognition and memory normal.        Judgment: Judgment normal.     Assessment  Plan  Early satiety - Plan: CT Abdomen Pelvis W Contrast Gastroesophageal reflux disease without esophagitis --> prilosec 40 mg qam and pepcid qhs -he has f/u with GI 09/2020 Lower abdominal pain/bloating ? Etiology r/o GI vs GU reduce dairy though not doing much - Plan: Comprehensive metabolic panel, CBC with Differential/Platelet, Urinalysis, Routine w reflex microscopic, Urine Culture, CT Abdomen Pelvis W Contrast Stop activia, reduce other daily and try align probiotics  Zenker's diverticulum -->he is w/o sx's this was noted on barium 07/2018  Thoracic ascending aortic aneurysm (HCC) - Plan: ECHOCARDIOGRAM COMPLETE Messaged Dr. Julien Girt CTS in [place] pt wants to do CTA/MRA Q6 months to f/u and not 12 months though he initially stated 12 months will see if ok with 6 month f/u   Bilateral impacted cerumen Consented and removed b/l cerumen both ears with improved with currette wax removed  Debrox ear wax drops otc   Cirrhosis of liver without ascites, unspecified hepatic cirrhosis type ([place]) - Plan: ECHOCARDIOGRAM COMPLETE CT scan abdomen US 04/2020 + cirrhosis denies etoh, neg hepatitis to echo r/o CHF   Provider: Dr. Olivia Mackie McLean-Scocuzza-Internal Medicine

## 2020-09-10 ENCOUNTER — Telehealth: Payer: Self-pay | Admitting: Internal Medicine

## 2020-09-10 LAB — URINALYSIS, ROUTINE W REFLEX MICROSCOPIC
Bilirubin, UA: NEGATIVE
Glucose, UA: NEGATIVE
Ketones, UA: NEGATIVE
Leukocytes,UA: NEGATIVE
Nitrite, UA: NEGATIVE
Protein,UA: NEGATIVE
RBC, UA: NEGATIVE
Specific Gravity, UA: 1.011 (ref 1.005–1.030)
Urobilinogen, Ur: 0.2 mg/dL (ref 0.2–1.0)
pH, UA: 5.5 (ref 5.0–7.5)

## 2020-09-10 LAB — COMPREHENSIVE METABOLIC PANEL
ALT: 30 U/L (ref 0–53)
AST: 26 U/L (ref 0–37)
Albumin: 4.6 g/dL (ref 3.5–5.2)
Alkaline Phosphatase: 44 U/L (ref 39–117)
BUN: 24 mg/dL — ABNORMAL HIGH (ref 6–23)
CO2: 32 mEq/L (ref 19–32)
Calcium: 9.7 mg/dL (ref 8.4–10.5)
Chloride: 101 mEq/L (ref 96–112)
Creatinine, Ser: 1 mg/dL (ref 0.40–1.50)
GFR: 68.23 mL/min (ref >60.00)
Glucose, Bld: 116 mg/dL — ABNORMAL HIGH (ref 70–99)
Potassium: 4.4 mEq/L (ref 3.5–5.1)
Sodium: 141 mEq/L (ref 135–145)
Total Bilirubin: 0.5 mg/dL (ref 0.2–1.2)
Total Protein: 6.9 g/dL (ref 6.0–8.3)

## 2020-09-10 LAB — CBC WITH DIFFERENTIAL/PLATELET
Basophils Absolute: 0 10*3/uL (ref 0.0–0.1)
Basophils Relative: 0.8 % (ref 0.0–3.0)
Eosinophils Absolute: 0 10*3/uL (ref 0.0–0.7)
Eosinophils Relative: 0.1 % (ref 0.0–5.0)
HCT: 42.3 % (ref 39.0–52.0)
Hemoglobin: 14.4 g/dL (ref 13.0–17.0)
Lymphocytes Relative: 27.5 % (ref 12.0–46.0)
Lymphs Abs: 1.6 10*3/uL (ref 0.7–4.0)
MCHC: 34.1 g/dL (ref 30.0–36.0)
MCV: 89.4 fl (ref 78.0–100.0)
Monocytes Absolute: 0.4 10*3/uL (ref 0.1–1.0)
Monocytes Relative: 7.6 % (ref 3.0–12.0)
Neutro Abs: 3.6 10*3/uL (ref 1.4–7.7)
Neutrophils Relative %: 64 % (ref 43.0–77.0)
Platelets: 168 10*3/uL (ref 150.0–400.0)
RBC: 4.73 Mil/uL (ref 4.22–5.81)
RDW: 13.3 % (ref 11.5–15.5)
WBC: 5.7 10*3/uL (ref 4.0–10.5)

## 2020-09-10 NOTE — Telephone Encounter (Signed)
Patient informed and verbalized understanding

## 2020-09-10 NOTE — Progress Notes (Signed)
Inform pt cardiothoracic surgeon ok with repeating imaging in 6 months  Will leave up to CTS or PCP to order CTA/MRA chest likely CTS and inform patient please

## 2020-09-10 NOTE — Telephone Encounter (Addendum)
Filed: 09/10/2020 8:42 AM Encounter Date: 09/09/2020 Status: Signed Editor: McLean-Scocuzza, Nino Glow, MD (Physician)       Show:Clear all ManualTemplateCopied  Added by: McLean-Scocuzza, Nino Glow, MD   Hover for details  Inform pt cardiothoracic surgeon ok with repeating imaging in 6 months  Will leave up to CTS or PCP to order CTA/MRA chest likely CTS and inform patient please      ----- Message from Delorise Jackson, MD sent at 09/10/2020  8:41 AM EDT -----   ----- Message ----- From: Wonda Olds, MD Sent: 09/10/2020   8:33 AM EDT To: Nino Glow McLean-Scocuzza, MD  That's fine ----- Message ----- From: McLean-ScocuzzaNino Glow, MD Sent: 09/09/2020   6:41 PM EDT To: Wonda Olds, MD  Based on the report semi annual pt wants it in 6 months would you consider this?   Thank you   ----- Message ----- From: Wonda Olds, MD Sent: 09/09/2020   2:40 PM EDT To: Nino Glow McLean-Scocuzza, MD  12 mos is fine; at his age we will be hard pressed to do elective aneurysm surgery on him. Thanks. Zane ----- Message ----- From: McLean-Scocuzza, Nino Glow, MD Sent: 09/09/2020   2:36 PM EDT To: Wonda Olds, MD  Pt wanted clarification on CTA/MRA 4.7 T aneurysm   Report says semi-annual and he wants to know if he needs repeat in 6 months or 12 months

## 2020-09-11 ENCOUNTER — Encounter: Payer: Self-pay | Admitting: Internal Medicine

## 2020-09-11 ENCOUNTER — Telehealth: Payer: Self-pay | Admitting: Internal Medicine

## 2020-09-11 ENCOUNTER — Telehealth: Payer: Self-pay

## 2020-09-11 ENCOUNTER — Other Ambulatory Visit: Payer: Self-pay

## 2020-09-11 ENCOUNTER — Ambulatory Visit
Admission: RE | Admit: 2020-09-11 | Discharge: 2020-09-11 | Disposition: A | Discharge status: Home / Self Care | Payer: PPO | Source: Ambulatory Visit | Attending: Internal Medicine | Admitting: Internal Medicine

## 2020-09-11 ENCOUNTER — Other Ambulatory Visit: Payer: Self-pay | Admitting: Internal Medicine

## 2020-09-11 DIAGNOSIS — R1030 Lower abdominal pain, unspecified: Secondary | ICD-10-CM

## 2020-09-11 DIAGNOSIS — K219 Gastro-esophageal reflux disease without esophagitis: Secondary | ICD-10-CM | POA: Diagnosis not present

## 2020-09-11 DIAGNOSIS — R6881 Early satiety: Secondary | ICD-10-CM | POA: Diagnosis not present

## 2020-09-11 DIAGNOSIS — R109 Unspecified abdominal pain: Secondary | ICD-10-CM | POA: Diagnosis not present

## 2020-09-11 DIAGNOSIS — K8689 Other specified diseases of pancreas: Secondary | ICD-10-CM | POA: Insufficient documentation

## 2020-09-11 LAB — POCT I-STAT CREATININE: Creatinine, Ser: 1 mg/dL (ref 0.61–1.24)

## 2020-09-11 MED ORDER — IOHEXOL 300 MG/ML  SOLN
85.0000 mL | Freq: Once | INTRAMUSCULAR | Status: AC | PRN
  Administered 2020-09-11: 85 mL via INTRAVENOUS

## 2020-09-11 NOTE — Telephone Encounter (Signed)
Does he want to see local GI and local oncology and have endoscopic biopsy in [place] or go straight to [facility]

## 2020-09-11 NOTE — Telephone Encounter (Signed)
Patient informed and verbalized understanding

## 2020-09-11 NOTE — Telephone Encounter (Signed)
I'm ok with either way, [facility] or [facility] can get it done sooner.

## 2020-09-11 NOTE — Telephone Encounter (Signed)
Patient calling back in to inquire what the CT said about his liver? States the US done in 11/21 was abnormal.   Patient states [facility] is not in network and that they will need the referral sent in with a claim from [place].   Patient wanting to know if the referral needs to be changed into Dr Lupita Dawn name. Please advise

## 2020-09-11 NOTE — Telephone Encounter (Signed)
This is ok with me if pt agreeable will defer to pt preference to PCP's recs

## 2020-09-11 NOTE — Telephone Encounter (Signed)
Does he want to see local GI and local oncology and have endoscopic biopsy in [place] or go straight to [facility]

## 2020-09-11 NOTE — Telephone Encounter (Signed)
Dr. Marius Ditch appt with upcoming 09/2020  See CT scan +pancreatic mass can you help coordinate care

## 2020-09-11 NOTE — Telephone Encounter (Signed)
It did not mention any liver cirrhosis like the ultrasound. Please f/u with GI to see what they think I think my referral is enough [facility] Dr. Tillie Rung or her colleagues should call him for appt placed urgently

## 2020-09-11 NOTE — Telephone Encounter (Signed)
Olivia Mackie- I can see him on Monday and have Kristi get him scheduled for EUS with [facility] but they can do it in [place] if you are ok with it

## 2020-09-11 NOTE — Telephone Encounter (Signed)
Radiology called with CT results: Impression pasted below.   IMPRESSION: 1. 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 Recommend GI follow-up for consideration of EUS. 2. No evidence of metastatic disease. 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

## 2020-09-11 NOTE — Telephone Encounter (Signed)
Patient spoke with Dr Olivia Mackie McLean-Scocuzza and would like to be seen locally. This has been initiated

## 2020-09-11 NOTE — Telephone Encounter (Signed)
I disc with PCP we for now referred to [facility] in [facility] for EUS and further work up  Thank you

## 2020-09-11 NOTE — Telephone Encounter (Signed)
Called and spoke with George Owens to coordinate his care for new pancreas mass. Appointment arranged with Dr. Janese Banks for 09-18-20 at 0900. He is aware of cancer center location. We will arrange EUS at [facility] or [facility], whichever is quicker.

## 2020-09-11 NOTE — Addendum Note (Signed)
Addended by: Orland Mustard on: 09/11/2020 10:52 AM   Modules accepted: Orders

## 2020-09-11 NOTE — Telephone Encounter (Signed)
He will need to be seen by oncology Dr Janese Banks, I am going to refer this patient to you, can you please see him  Thanks RV

## 2020-09-12 LAB — URINE CULTURE

## 2020-09-14 ENCOUNTER — Other Ambulatory Visit: Payer: Self-pay

## 2020-09-14 NOTE — Progress Notes (Unsigned)
Ambulatory

## 2020-09-15 ENCOUNTER — Telehealth: Payer: Self-pay | Admitting: Internal Medicine

## 2020-09-15 NOTE — Progress Notes (Signed)
LVM no answer. Instructed pt to return call as soon as possible.

## 2020-09-15 NOTE — Telephone Encounter (Signed)
Patient was returning call about results he would like a call on his cell phone [phone]

## 2020-09-16 NOTE — Telephone Encounter (Signed)
Patient would like a call back from office. See note below.

## 2020-09-17 ENCOUNTER — Telehealth: Payer: Self-pay

## 2020-09-17 DIAGNOSIS — R933 Abnormal findings on diagnostic imaging of other parts of digestive tract: Secondary | ICD-10-CM | POA: Diagnosis not present

## 2020-09-17 DIAGNOSIS — K219 Gastro-esophageal reflux disease without esophagitis: Secondary | ICD-10-CM | POA: Diagnosis not present

## 2020-09-17 DIAGNOSIS — K8689 Other specified diseases of pancreas: Secondary | ICD-10-CM | POA: Diagnosis not present

## 2020-09-17 DIAGNOSIS — Z79899 Other long term (current) drug therapy: Secondary | ICD-10-CM | POA: Diagnosis not present

## 2020-09-17 DIAGNOSIS — R1013 Epigastric pain: Secondary | ICD-10-CM | POA: Diagnosis not present

## 2020-09-17 DIAGNOSIS — C251 Malignant neoplasm of body of pancreas: Secondary | ICD-10-CM | POA: Diagnosis not present

## 2020-09-17 DIAGNOSIS — M810 Age-related osteoporosis without current pathological fracture: Secondary | ICD-10-CM | POA: Diagnosis not present

## 2020-09-17 DIAGNOSIS — Z8616 Personal history of COVID-19: Secondary | ICD-10-CM | POA: Diagnosis not present

## 2020-09-17 NOTE — Telephone Encounter (Signed)
EUS performed today by Dr. Francella Solian. Copy of report sent to HIM for scanning to medical record.

## 2020-09-17 NOTE — Telephone Encounter (Signed)
Noted  

## 2020-09-17 NOTE — Telephone Encounter (Signed)
Pt called and states that he already has seen labs that Dr Olivia Mackie resulted on [place] and has no further questions at this time

## 2020-09-18 ENCOUNTER — Encounter: Payer: Self-pay | Admitting: Gastroenterology

## 2020-09-18 ENCOUNTER — Telehealth: Payer: Self-pay | Admitting: *Deleted

## 2020-09-18 ENCOUNTER — Encounter: Payer: Self-pay | Admitting: Oncology

## 2020-09-18 ENCOUNTER — Inpatient Hospital Stay: Payer: PPO

## 2020-09-18 ENCOUNTER — Inpatient Hospital Stay: Payer: PPO | Attending: Oncology | Admitting: Oncology

## 2020-09-18 DIAGNOSIS — I722 Aneurysm of renal artery: Secondary | ICD-10-CM

## 2020-09-18 DIAGNOSIS — C259 Malignant neoplasm of pancreas, unspecified: Secondary | ICD-10-CM

## 2020-09-18 DIAGNOSIS — Z9221 Personal history of antineoplastic chemotherapy: Secondary | ICD-10-CM | POA: Diagnosis not present

## 2020-09-18 DIAGNOSIS — K7460 Unspecified cirrhosis of liver: Secondary | ICD-10-CM

## 2020-09-18 DIAGNOSIS — E785 Hyperlipidemia, unspecified: Secondary | ICD-10-CM

## 2020-09-18 DIAGNOSIS — Z5111 Encounter for antineoplastic chemotherapy: Secondary | ICD-10-CM | POA: Insufficient documentation

## 2020-09-18 DIAGNOSIS — Z298 Encounter for other specified prophylactic measures: Secondary | ICD-10-CM | POA: Insufficient documentation

## 2020-09-18 DIAGNOSIS — K219 Gastro-esophageal reflux disease without esophagitis: Secondary | ICD-10-CM | POA: Diagnosis not present

## 2020-09-18 DIAGNOSIS — Z8546 Personal history of malignant neoplasm of prostate: Secondary | ICD-10-CM | POA: Diagnosis not present

## 2020-09-18 DIAGNOSIS — C251 Malignant neoplasm of body of pancreas: Secondary | ICD-10-CM

## 2020-09-18 DIAGNOSIS — Z7189 Other specified counseling: Secondary | ICD-10-CM | POA: Insufficient documentation

## 2020-09-18 MED ORDER — PANTOPRAZOLE SODIUM 40 MG PO TBEC: 40.0000 mg | DELAYED_RELEASE_TABLET | Freq: Two times a day (BID) | ORAL | 0 refills | Status: DC

## 2020-09-18 NOTE — Progress Notes (Signed)
Noted that pathology has resulted and George Owens was notified by [facility] team. Dr. Janese Banks updated. Referral has been sent to [facility] surgeon at [facility]. PET has been scheduled.

## 2020-09-18 NOTE — Progress Notes (Signed)
Pt says he voice is not quite back to normal but no pain. No acid reflux today. Since right before the scan his bowels has just been working normal. Has not had to use miralax. He is here to review scans and ust had egd yest.

## 2020-09-18 NOTE — Progress Notes (Signed)
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 Derrel Nip  Date of visit: 09/18/20   History of presenting illness-patient is a 87 year old male with a past medical history significant for GERD, hyperlipidemia, cirrhosis.  He was having symptoms of Abdominal pain and reflux and therefore underwent a CT abdomen and pelvis with contrast on 09/11/2020 which showed a hypointense ill-defined mass in the pancreatic body up to 25 mm.  No extrapancreatic infiltrative density about the aorta or proximal [name].  No local regional adenopathy or distant metastatic disease.  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  This was followed by an EUS at [facility] which showed a hypoechoic mass measuring 1.8 x 1.6 cm in the pancreatic body.  Endosonographic borders poorly defined.  Upstream pancreatic duct dilatation.  No abnormal appearing lymph nodes.  Endosonographic imaging of the liver showed no lesion.  Biopsy showed adenocarcinoma.  Patient referred for further management.  Patient presently reports symptoms of heartburn but is otherwise doing well.  Denies any Significant pain.  Appetite and weight have remained normal.  He is independent of his ADLs and IADLs.  ECOG PS- 1  Pain scale- 0   Review of systems- Review of Systems  Constitutional: Negative for chills, fever, malaise/fatigue and weight loss.  HENT: Negative for congestion, ear discharge and nosebleeds.   Eyes: Negative for blurred vision.  Respiratory: Negative for cough, hemoptysis, sputum production, shortness of breath and wheezing.   Cardiovascular: Negative for chest pain, palpitations, orthopnea and  claudication.  Gastrointestinal: Positive for heartburn. Negative for abdominal pain, blood in stool, constipation, diarrhea, melena, nausea and vomiting.  Genitourinary: Negative for dysuria, flank pain, frequency, hematuria and urgency.  Musculoskeletal: Negative for back pain, joint pain and myalgias.  Skin: Negative for rash.  Neurological: Negative for dizziness, tingling, focal weakness, seizures, weakness and headaches.  Endo/Heme/Allergies: Does not bruise/bleed easily.  Psychiatric/Behavioral: Negative for depression and suicidal ideas. The patient does not have insomnia.     Allergies  Allergen Reactions  . Kenalog [Triamcinolone Acetonide]     Blindness X 3 Days    Patient Active Problem List   Diagnosis Date Noted  . Mass of pancreas 09/11/2020  . Cirrhosis of liver ([place]) 05/12/2020  . Thoracic ascending aortic aneurysm ([place]) 04/29/2020  . Abnormal computerized axial tomography of liver 04/29/2020  . Snoring 04/28/2020  . Use of leuprolide acetate (Lupron) 04/27/2020  . Thoracic aortic atherosclerosis ([place]) 04/14/2020  . Anxiety about health 04/14/2020  . Nocturia 04/14/2020  . 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  . Constipation due to outlet dysfunction 02/16/2019  . Dysphagia, pharyngoesophageal phase 08/01/2018  . GERD (gastroesophageal reflux disease) 04/07/2018  . Post-nasal drainage 06/28/2017  . Change in bowel function 11/20/2016  . Neuropathy 03/19/2014  . Spinal stenosis of lumbar region 02/26/2014  . Osteoporosis 10/06/2013  . Vitamin D deficiency 09/10/2013  . History of prostate cancer 03/01/2013  . Personal history of colonic polyps 02/27/2013  . Myalgia due to statin 02/27/2013  . Visit for preventive health examination 02/27/2013  . Hyperlipidemia      Past Medical History:  Diagnosis Date  . Allergy   . Basal cell carcinoma    right temple Dr. Evorn Gong 12/2018  . Chicken pox   .  Colon polyps   . COVID-19 virus infection 03/16/2020  . GERD  (gastroesophageal reflux disease)   . history of prostate CA 2006  . Hyperlipidemia   . Leg cramps   . Neuropathy   . Osteoporosis      Past Surgical History:  Procedure Laterality Date  . CATARACT EXTRACTION Right   . CATARACT EXTRACTION W/PHACO Left 12/26/2017   Procedure: CATARACT EXTRACTION PHACO AND INTRAOCULAR LENS PLACEMENT ([place])  COMPLICATED LEFT;  Surgeon: Leandrew Koyanagi, MD;  Location: [facility];  Service: Ophthalmology;  Laterality: Left;  [place]  . colonoscopy    . EYE MUSCLE SURGERY Left   . TONSILLECTOMY AND ADENOIDECTOMY      Social History   Socioeconomic History  . Marital status: Married    Spouse name: Eloise  . Number of children: 4  . Years of education: Not on file  . Highest education level: Not on file  Occupational History  . Occupation: retired  Tobacco Use  . Smoking status: Never Smoker  . Smokeless tobacco: Never Used  Vaping Use  . Vaping Use: Never used  Substance and Sexual Activity  . Alcohol use: No  . Drug use: No  . Sexual activity: Yes    Partners: Female  Other Topics Concern  . Not on file  Social History Narrative   Lives with wife in a 5 story retirement home.  He lives on the first floor.  4 year college degree.  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     Social Determinants of Health   Financial Resource Strain: Low Risk   . Difficulty of Paying Living Expenses: Not hard at all  Food Insecurity: Not on file  Transportation Needs: No Transportation Needs  . Lack of Transportation (Medical): No  . Lack of Transportation (Non-Medical): No  Physical Activity: Not on file  Stress: No Stress Concern Present  . Feeling of Stress : Not at all  Social Connections: Unknown  . Frequency of Communication with Friends and Family: Not on file  . Frequency of Social Gatherings with Friends and Family: Not on file  . Attends Religious Services: Not on file  . Active Member of  Clubs or Organizations: Not on file  . Attends Archivist Meetings: Not on file  . Marital Status: Married  Human resources officer Violence: Not on file     Family History  Problem Relation Age of Onset  . Stroke Mother   . Arthritis Father   . Heart disease Father   . Brain cancer Brother        1/2 brother (possibly related to mother NOT related to patient)  . Colon cancer Neg Hx   . Esophageal cancer Neg Hx   . Pancreatic cancer Neg Hx   . Stomach cancer Neg Hx   . Liver disease Neg Hx      Current Outpatient Medications:  .  Calcium Carb-Cholecalciferol (CALCIUM 1000 + D PO), Take 1 tablet by mouth daily. , Disp: , Rfl:  .  Denosumab (PROLIA [place]), Inject into the skin every 6 (six) months., Disp: , Rfl:  .  glucosamine-chondroitin 500-400 MG tablet, Take 1 tablet by mouth daily., Disp: , Rfl:  .  loratadine (CLARITIN) 10 MG tablet, Take 10 mg by mouth daily as needed. , Disp: , Rfl:  .  naproxen (NAPROSYN) 250 MG tablet, Take 250 mg by mouth as needed., Disp: , Rfl:  .  omeprazole (PRILOSEC) 40 MG capsule,  TAKE 1 CAPSULE (40 MG TOTAL) BY MOUTH DAILY. IN THE MORNING 30 MINUTES PRIOR TO EATING, Disp: 90 capsule, Rfl: 3 .  Ranibizumab (LUCENTIS IO), Inject into the eye as needed. As needed every three months., Disp: , Rfl:  .  traZODone (DESYREL) 50 MG tablet, TAKE 0.5 TABLETS (25 MG TOTAL) BY MOUTH AT BEDTIME. MAY INCREASE TO FULL TABLET IF NEEDED, Disp: 90 tablet, Rfl: 0 .  vitamin B-12 (CYANOCOBALAMIN) 1000 MCG tablet, Take 1,000 mcg by mouth daily., Disp: , Rfl:  .  VITAMIN D PO, Take 2,000 Units by mouth daily., Disp: , Rfl:  .  pramoxine-hydrocortisone (PROCTOCREAM-HC) 1-1 % rectal cream, PLACE 1 APPLICATION RECTALLY 2 (TWO) TIMES DAILY. (Patient not taking: No sig reported), Disp: 30 g, Rfl: 0   Physical exam:  Physical Exam Constitutional:      General: He is not in acute distress. Cardiovascular:     Rate and Rhythm: Normal rate and regular rhythm.     Heart  sounds: Normal heart sounds.  Pulmonary:     Effort: Pulmonary effort is normal.     Breath sounds: Normal breath sounds.  Abdominal:     General: Bowel sounds are normal.     Palpations: Abdomen is soft.  Skin:    General: Skin is warm and dry.  Neurological:     Mental Status: He is alert and oriented to person, place, and time.        CMP Latest Ref Rng & Units 09/11/2020  Glucose 70 - 99 mg/dL -  BUN 6 - 23 mg/dL -  Creatinine 0.61 - 1.24 mg/dL 1.00  Sodium 135 - 145 mEq/L -  Potassium 3.5 - 5.1 mEq/L -  Chloride 96 - 112 mEq/L -  CO2 19 - 32 mEq/L -  Calcium 8.4 - 10.5 mg/dL -  Total Protein 6.0 - 8.3 g/dL -  Total Bilirubin 0.2 - 1.2 mg/dL -  Alkaline Phos 39 - 117 U/L -  AST 0 - 37 U/L -  ALT 0 - 53 U/L -   CBC Latest Ref Rng & Units 09/09/2020  WBC 4.0 - 10.5 K/uL 5.7  Hemoglobin 13.0 - 17.0 g/dL 14.4  Hematocrit 39.0 - 52.0 % 42.3  Platelets 150.0 - 400.0 K/uL 168.0    No images are attached to the encounter.  CT Abdomen Pelvis W Contrast  Result Date: 09/11/2020 CLINICAL DATA:  Acute nonlocalized abdominal pain EXAM: CT ABDOMEN AND PELVIS WITH CONTRAST TECHNIQUE: Multidetector CT imaging of the abdomen and pelvis was performed using the standard protocol following bolus administration of intravenous contrast. CONTRAST:  90mL OMNIPAQUE IOHEXOL 300 MG/ML  SOLN COMPARISON:  02/28/2013 FINDINGS: Lower chest:  No contributory findings. Hepatobiliary: No focal liver abnormality. Lobulated liver surface. There is history of cirrhosis in the chart.No evidence of biliary obstruction or stone. Pancreas: Hypointense ill-defined mass in the pancreatic body. Although difficult to visualize due to generalized pancreatic atrophy there is ductal dilatation in the more proximal tail. No generalized or prior noted inflammation to implicate pancreatitis. Dimensions measure up to 25 mm on axial slices. No extra pancreatic infiltrative density about the aorta or proximal viscera. Spleen:  Unremarkable. Adrenals/Urinary Tract: Negative adrenals. No hydronephrosis or stone. Unremarkable bladder. Stomach/Bowel:  No obstruction. No appendicitis. Vascular/Lymphatic: No acute vascular abnormality. Atheromatous calcification. 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 No mass or adenopathy. Reproductive:Fiducial markers at the posterior prostate. Other: No ascites or pneumoperitoneum. Musculoskeletal: No acute abnormalities. Remote L2 superior endplate fracture. Benign sclerosis  in the intertrochanteric right femur. These results will be called to the ordering clinician or representative by the Radiologist Assistant, and communication documented in the PACS or [employer]. IMPRESSION: 1. 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 Recommend GI follow-up for consideration of EUS. 2. No evidence of metastatic disease. 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 Electronically Signed   By: Monte Fantasia M.D.   On: 09/11/2020 09:57    Assessment and plan- Patient is a 87 y.o. male with newly diagnosed pancreatic adenocarcinoma likely stage I T1 cN0 Mx to discuss further management  Biopsy results were not back at the time of my visit today but did come back later positive for adenocarcinoma.  Based on his recent CT abdomen and pelvis with contrast there was no evidence of distant metastatic disease.  No locoregional lymph node involvement seen on EUS either.  I will get a PET CT scan to get his complete staging work-up and check a CA 19-9 today.  Patient has previously had CT chest With contrast in November 2021 which showed a 4.7 cm ascending thoracic aortic aneurysm and liver cirrhosis but no other significant findings.  At this time I would refer him to [facility] hepatobiliary surgery but I explained to the patient that despite stage I pancreatic cancer, given his age he will likely not be a candidate for [facility] surgery.  Other nonsurgical options  at this time would be SBRT to the pancreatic lesion which can be offered at [facility] versus concurrent chemoradiation which we can do here in [place].  Treatment will be given with the potential curative intent.  I will tentatively see him back after he is seen at [facility] and PET CT scan results are back  Patient has prior history of prostate cancer and now with his pancreatic cancer he would qualify for genetic testing.  Patient would like to hold off on that at this time but will talk about it further at my next visit.   Patient and his family had multiple insightful questions and all of them were answered to their satisfaction   Thank you for this kind referral and the opportunity to participate in the care of this patient   Visit Diagnosis 1. Malignant neoplasm of pancreas, unspecified location of malignancy [facility])     Dr. Randa Evens, MD, MPH [facility] at [facility] [phone] 09/18/2020 1:11 PM

## 2020-09-18 NOTE — Telephone Encounter (Signed)
Called pt and got voicemail and said that I thought George Owens called the pt. And dr Janese Banks would like him to come in today but if not then he can come thursday

## 2020-09-19 LAB — CANCER ANTIGEN 19-9: CA 19-9: 32 U/mL (ref 0–35)

## 2020-09-23 ENCOUNTER — Other Ambulatory Visit: Payer: Self-pay | Admitting: Cardiothoracic Surgery

## 2020-09-23 DIAGNOSIS — I712 Thoracic aortic aneurysm, without rupture, unspecified: Secondary | ICD-10-CM

## 2020-09-24 ENCOUNTER — Telehealth: Payer: Self-pay

## 2020-09-24 NOTE — Telephone Encounter (Signed)
Spoke with Mr. Rosander. Follow up arranged with Dr. Janese Banks 4/15 at 1400. Reviewed PET scan instructions.

## 2020-09-25 DIAGNOSIS — K5900 Constipation, unspecified: Secondary | ICD-10-CM | POA: Diagnosis not present

## 2020-09-25 DIAGNOSIS — Z923 Personal history of irradiation: Secondary | ICD-10-CM | POA: Diagnosis not present

## 2020-09-25 DIAGNOSIS — K219 Gastro-esophageal reflux disease without esophagitis: Secondary | ICD-10-CM | POA: Diagnosis not present

## 2020-09-25 DIAGNOSIS — R197 Diarrhea, unspecified: Secondary | ICD-10-CM | POA: Diagnosis not present

## 2020-09-25 DIAGNOSIS — Z79899 Other long term (current) drug therapy: Secondary | ICD-10-CM | POA: Diagnosis not present

## 2020-09-25 DIAGNOSIS — C251 Malignant neoplasm of body of pancreas: Secondary | ICD-10-CM | POA: Diagnosis not present

## 2020-09-25 DIAGNOSIS — R6881 Early satiety: Secondary | ICD-10-CM | POA: Diagnosis not present

## 2020-09-25 DIAGNOSIS — R140 Abdominal distension (gaseous): Secondary | ICD-10-CM | POA: Diagnosis not present

## 2020-09-29 ENCOUNTER — Encounter
Admission: RE | Admit: 2020-09-29 | Discharge: 2020-09-29 | Disposition: A | Discharge status: Home / Self Care | Payer: PPO | Source: Ambulatory Visit | Attending: Oncology | Admitting: Oncology

## 2020-09-29 ENCOUNTER — Other Ambulatory Visit: Payer: Self-pay

## 2020-09-29 DIAGNOSIS — E041 Nontoxic single thyroid nodule: Secondary | ICD-10-CM | POA: Diagnosis not present

## 2020-09-29 DIAGNOSIS — C259 Malignant neoplasm of pancreas, unspecified: Secondary | ICD-10-CM | POA: Diagnosis not present

## 2020-09-29 DIAGNOSIS — K869 Disease of pancreas, unspecified: Secondary | ICD-10-CM | POA: Diagnosis not present

## 2020-09-29 DIAGNOSIS — C251 Malignant neoplasm of body of pancreas: Secondary | ICD-10-CM | POA: Insufficient documentation

## 2020-09-29 LAB — GLUCOSE, CAPILLARY: Glucose-Capillary: 93 mg/dL (ref 70–99)

## 2020-09-29 MED ORDER — FLUDEOXYGLUCOSE F - 18 (FDG) INJECTION
9.8000 | Freq: Once | INTRAVENOUS | Status: AC | PRN
  Administered 2020-09-29: 10.083 via INTRAVENOUS

## 2020-10-02 ENCOUNTER — Inpatient Hospital Stay (HOSPITAL_BASED_OUTPATIENT_CLINIC_OR_DEPARTMENT_OTHER): Payer: PPO | Admitting: Oncology

## 2020-10-02 ENCOUNTER — Other Ambulatory Visit: Payer: Self-pay | Admitting: *Deleted

## 2020-10-02 ENCOUNTER — Encounter: Payer: Self-pay | Admitting: Oncology

## 2020-10-02 VITALS — BP 117/69 | HR 67 | Temp 96.9°F | Resp 16 | Wt 189.7 lb

## 2020-10-02 DIAGNOSIS — Z7189 Other specified counseling: Secondary | ICD-10-CM | POA: Diagnosis not present

## 2020-10-02 DIAGNOSIS — C251 Malignant neoplasm of body of pancreas: Secondary | ICD-10-CM

## 2020-10-02 DIAGNOSIS — Z5111 Encounter for antineoplastic chemotherapy: Secondary | ICD-10-CM | POA: Diagnosis not present

## 2020-10-02 NOTE — Progress Notes (Signed)
Pt here to get results of the scan and make plan for cancer

## 2020-10-04 ENCOUNTER — Other Ambulatory Visit: Payer: Self-pay | Admitting: *Deleted

## 2020-10-04 DIAGNOSIS — C251 Malignant neoplasm of body of pancreas: Secondary | ICD-10-CM

## 2020-10-04 MED ORDER — ONDANSETRON HCL 8 MG PO TABS: 8.0000 mg | ORAL_TABLET | Freq: Two times a day (BID) | ORAL | 1 refills | Status: DC | PRN

## 2020-10-04 MED ORDER — LORAZEPAM 0.5 MG PO TABS: 0.5000 mg | ORAL_TABLET | Freq: Four times a day (QID) | ORAL | 0 refills | Status: DC | PRN

## 2020-10-04 MED ORDER — LIDOCAINE-PRILOCAINE 2.5-2.5 % EX CREA: TOPICAL_CREAM | CUTANEOUS | 3 refills | Status: DC

## 2020-10-04 MED ORDER — PROCHLORPERAZINE MALEATE 10 MG PO TABS: 10.0000 mg | ORAL_TABLET | Freq: Four times a day (QID) | ORAL | 1 refills | Status: DC | PRN

## 2020-10-04 NOTE — Progress Notes (Signed)
START ON PATHWAY REGIMEN - Pancreatic Adenocarcinoma     A cycle is every 28 days:     Nab-paclitaxel (protein bound)      Gemcitabine   **Always confirm dose/schedule in your pharmacy ordering system**  Patient Characteristics: Preoperative (Clinical Staging), Resectable, Neoadjuvant Therapy Planned, BRCA1/2 and PALB2 Mutation Absent/Unknown Therapeutic Status: Preoperative (Clinical Staging) AJCC T Category: cT2 AJCC N Category: cN0 Resectability Status: Resectable AJCC M Category: cM0 AJCC 8 Stage Grouping: IB BRCA1/2 Mutation Status: Awaiting Test Results PALB2 Mutation Status: Awaiting Test Results Intent of Therapy: Curative Intent, Discussed with Patient

## 2020-10-04 NOTE — Progress Notes (Signed)
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  He was having symptoms of Abdominal pain and reflux and therefore underwent a CT abdomen and pelvis with contrast on 09/11/2020 which showed a hypointense ill-defined mass in the pancreatic body up to 25 mm.  No extrapancreatic infiltrative density about the aorta or proximal [name].  No local regional adenopathy or distant metastatic disease.  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  This was followed by an EUS at [facility] which showed a hypoechoic mass measuring 1.8 x 1.6 cm in the pancreatic body.  Endosonographic borders poorly defined.  Upstream pancreatic duct dilatation.  No abnormal appearing lymph nodes.  Endosonographic imaging of the liver showed no lesion.  Biopsy showed adenocarcinoma.  PET scan showed 2.7 x 2 cm ovoid mass in the body of the pancreas with an SUV of 7.1.  18 mm hypermetabolic nodule in the isthmus of the thyroid gland.  No evidence of local regional adenopathy or distant metastatic disease.  Interval history-patient reports doing well presently and denies any specific complaints at this time.  He is here with his daughter today  ECOG PS- 1 Pain scale- 0   Review of systems- Review of Systems  Constitutional: Negative for chills, fever, malaise/fatigue and weight loss.  HENT: Negative for congestion, ear discharge and nosebleeds.   Eyes: Negative for blurred vision.  Respiratory:  Negative for cough, hemoptysis, sputum production, shortness of breath and wheezing.   Cardiovascular: Negative for chest pain, palpitations, orthopnea and claudication.  Gastrointestinal: Negative for abdominal pain, blood in stool, constipation, diarrhea, heartburn, melena, nausea and vomiting.  Genitourinary: Negative for dysuria, flank pain, frequency, hematuria and urgency.  Musculoskeletal: Negative for back pain, joint pain and myalgias.  Skin: Negative for rash.  Neurological: Negative for dizziness, tingling, focal weakness, seizures, weakness and headaches.  Endo/Heme/Allergies: Does not bruise/bleed easily.  Psychiatric/Behavioral: Negative for depression and suicidal ideas. The patient does not have insomnia.        Allergies  Allergen Reactions  . Kenalog [Triamcinolone Acetonide]     Blindness X 3 Days     Past Medical History:  Diagnosis Date  . Allergy   . Basal cell carcinoma    right temple Dr. Evorn Gong 12/2018  . Chicken pox   . Colon polyps   . COVID-19 virus infection 03/16/2020  . GERD (gastroesophageal reflux disease)   . history of prostate CA 2006  . Hyperlipidemia   . Leg cramps   . Neuropathy   . Osteoporosis      Past Surgical History:  Procedure Laterality Date  . CATARACT EXTRACTION Right   . CATARACT EXTRACTION W/PHACO Left 12/26/2017   Procedure: CATARACT EXTRACTION PHACO AND INTRAOCULAR LENS PLACEMENT ([place])  COMPLICATED LEFT;  Surgeon: Leandrew Koyanagi, MD;  Location: [place];  Service: Ophthalmology;  Laterality: Left;  [place]  . colonoscopy    . EYE MUSCLE SURGERY Left   . TONSILLECTOMY AND ADENOIDECTOMY      Social History   Socioeconomic History  .  Marital status: Married    Spouse name: Eloise  . Number of children: 4  . Years of education: Not on file  . Highest education level: Not on file  Occupational History  . Occupation: retired  Tobacco Use  . Smoking status: Never Smoker  . Smokeless  tobacco: Never Used  Vaping Use  . Vaping Use: Never used  Substance and Sexual Activity  . Alcohol use: No  . Drug use: No  . Sexual activity: Yes    Partners: Female  Other Topics Concern  . Not on file  Social History Narrative   Lives with wife in a 5 story retirement home.  He lives on the first floor.  4 year college degree.  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     Social Determinants of Health   Financial Resource Strain: Low Risk   . Difficulty of Paying Living Expenses: Not hard at all  Food Insecurity: Not on file  Transportation Needs: No Transportation Needs  . Lack of Transportation (Medical): No  . Lack of Transportation (Non-Medical): No  Physical Activity: Not on file  Stress: No Stress Concern Present  . Feeling of Stress : Not at all  Social Connections: Unknown  . Frequency of Communication with Friends and Family: Not on file  . Frequency of Social Gatherings with Friends and Family: Not on file  . Attends Religious Services: Not on file  . Active Member of Clubs or Organizations: Not on file  . Attends Archivist Meetings: Not on file  . Marital Status: Married  Human resources officer Violence: Not on file    Family History  Problem Relation Age of Onset  . Stroke Mother   . Arthritis Father   . Heart disease Father   . Brain cancer Brother        1/2 brother (possibly related to mother NOT related to patient)  . Colon cancer Neg Hx   . Esophageal cancer Neg Hx   . Pancreatic cancer Neg Hx   . Stomach cancer Neg Hx   . Liver disease Neg Hx      Current Outpatient Medications:  .  Calcium Carb-Cholecalciferol (CALCIUM 1000 + D PO), Take 1 tablet by mouth daily. , Disp: , Rfl:  .  glucosamine-chondroitin 500-400 MG tablet, Take 1 tablet by mouth daily., Disp: , Rfl:  .  loratadine (CLARITIN) 10 MG tablet, Take 10 mg by mouth daily as needed. , Disp: , Rfl:  .  naproxen (NAPROSYN) 250 MG tablet, Take 250 mg by  mouth as needed., Disp: , Rfl:  .  pantoprazole (PROTONIX) 40 MG tablet, Take 1 tablet (40 mg total) by mouth 2 (two) times daily., Disp: 60 tablet, Rfl: 0 .  pramoxine-hydrocortisone (PROCTOCREAM-HC) 1-1 % rectal cream, PLACE 1 APPLICATION RECTALLY 2 (TWO) TIMES DAILY., Disp: 30 g, Rfl: 0 .  Ranibizumab (LUCENTIS IO), Inject into the eye as needed. As needed every three months., Disp: , Rfl:  .  traZODone (DESYREL) 50 MG tablet, TAKE 0.5 TABLETS (25 MG TOTAL) BY MOUTH AT BEDTIME. MAY INCREASE TO FULL TABLET IF NEEDED, Disp: 90 tablet, Rfl: 0 .  vitamin B-12 (CYANOCOBALAMIN) 1000 MCG tablet, Take 1,000 mcg by mouth in the morning and at bedtime., Disp: , Rfl:  .  VITAMIN D PO, Take 2,000 Units by mouth daily., Disp: , Rfl:  .  Denosumab (PROLIA [place]), Inject into the skin every 6 (six) months. (Patient not taking: Reported on 10/02/2020), Disp: ,  Rfl:  .  lidocaine-prilocaine (EMLA) cream, Apply to affected area once, Disp: 30 g, Rfl: 3 .  LORazepam (ATIVAN) 0.5 MG tablet, Take 1 tablet (0.5 mg total) by mouth every 6 (six) hours as needed (Nausea or vomiting)., Disp: 30 tablet, Rfl: 0 .  ondansetron (ZOFRAN) 8 MG tablet, Take 1 tablet (8 mg total) by mouth 2 (two) times daily as needed (Nausea or vomiting)., Disp: 30 tablet, Rfl: 1 .  prochlorperazine (COMPAZINE) 10 MG tablet, Take 1 tablet (10 mg total) by mouth every 6 (six) hours as needed (Nausea or vomiting)., Disp: 30 tablet, Rfl: 1  Physical exam:  Vitals:   10/02/20 1359  BP: 117/69  Pulse: 67  Resp: 16  Temp: (!) 96.9 F (36.1 C)  TempSrc: Tympanic  SpO2: 100%  Weight: 189 lb 11.2 oz (86 kg)   Physical Exam Constitutional:      General: He is not in acute distress. Cardiovascular:     Rate and Rhythm: Normal rate and regular rhythm.     Heart sounds: Normal heart sounds.  Pulmonary:     Effort: Pulmonary effort is normal.     Breath sounds: Normal breath sounds.  Abdominal:     General: Bowel sounds are normal.      Palpations: Abdomen is soft.  Skin:    General: Skin is warm and dry.  Neurological:     Mental Status: He is alert and oriented to person, place, and time.      CMP Latest Ref Rng & Units 09/11/2020  Glucose 70 - 99 mg/dL -  BUN 6 - 23 mg/dL -  Creatinine 0.61 - 1.24 mg/dL 1.00  Sodium 135 - 145 mEq/L -  Potassium 3.5 - 5.1 mEq/L -  Chloride 96 - 112 mEq/L -  CO2 19 - 32 mEq/L -  Calcium 8.4 - 10.5 mg/dL -  Total Protein 6.0 - 8.3 g/dL -  Total Bilirubin 0.2 - 1.2 mg/dL -  Alkaline Phos 39 - 117 U/L -  AST 0 - 37 U/L -  ALT 0 - 53 U/L -   CBC Latest Ref Rng & Units 09/09/2020  WBC 4.0 - 10.5 K/uL 5.7  Hemoglobin 13.0 - 17.0 g/dL 14.4  Hematocrit 39.0 - 52.0 % 42.3  Platelets 150.0 - 400.0 K/uL 168.0       CT Abdomen Pelvis W Contrast  Result Date: 09/11/2020 CLINICAL DATA:  Acute nonlocalized abdominal pain EXAM: CT ABDOMEN AND PELVIS WITH CONTRAST TECHNIQUE: Multidetector CT imaging of the abdomen and pelvis was performed using the standard protocol following bolus administration of intravenous contrast. CONTRAST:  32mL OMNIPAQUE IOHEXOL 300 MG/ML  SOLN COMPARISON:  02/28/2013 FINDINGS: Lower chest:  No contributory findings. Hepatobiliary: No focal liver abnormality. Lobulated liver surface. There is history of cirrhosis in the chart.No evidence of biliary obstruction or stone. Pancreas: Hypointense ill-defined mass in the pancreatic body. Although difficult to visualize due to generalized pancreatic atrophy there is ductal dilatation in the more proximal tail. No generalized or prior noted inflammation to implicate pancreatitis. Dimensions measure up to 25 mm on axial slices. No extra pancreatic infiltrative density about the aorta or proximal viscera. Spleen: Unremarkable. Adrenals/Urinary Tract: Negative adrenals. No hydronephrosis or stone. Unremarkable bladder. Stomach/Bowel:  No obstruction. No appendicitis. Vascular/Lymphatic: No acute vascular abnormality. Atheromatous  calcification. 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 No mass or adenopathy. Reproductive:Fiducial markers at the posterior prostate. Other: No ascites or pneumoperitoneum. Musculoskeletal: No acute abnormalities. Remote L2 superior endplate fracture.  Benign sclerosis in the intertrochanteric right femur. These results will be called to the ordering clinician or representative by the Radiologist Assistant, and communication documented in the PACS or [employer]. IMPRESSION: 1. 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 Recommend GI follow-up for consideration of EUS. 2. No evidence of metastatic disease. 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 Electronically Signed   By: Monte Fantasia M.D.   On: 09/11/2020 09:57   NM PET Image Initial (PI) Skull Base To Thigh  Result Date: 09/30/2020 CLINICAL DATA:  Initial treatment strategy for pancreatic carcinoma. EXAM: NUCLEAR MEDICINE PET SKULL BASE TO THIGH TECHNIQUE: 10.0 mCi F-18 FDG was injected intravenously. Full-ring PET imaging was performed from the skull base to thigh after the radiotracer. CT data was obtained and used for attenuation correction and anatomic localization. Fasting blood glucose: 93 mg/dl COMPARISON:  CT abdomen 09/11/2020 FINDINGS: Mediastinal blood pool activity: SUV max 2.5 Liver activity: SUV max NA NECK: No hypermetabolic lymph nodes in the neck. Hypermetabolic 18 mm nodule within the isthmus of the thyroid gland (image 72/series 3) with SUV max equal 4.7. Incidental CT findings: none CHEST: No hypermetabolic mediastinal or hilar nodes. No suspicious pulmonary nodules on the CT scan. Incidental CT findings: none ABDOMEN/PELVIS: Within the mid body the pancreas, ovoid mass measures 2.7 by 2.0 cm (image 153) with SUV max equal 7.1. No additional lesions in the pancreas. No abnormal metabolic activity in the liver. No hypermetabolic periportal peripancreatic lymph nodes. No  periaortic retroperitoneal hypermetabolic lymph nodes or pelvic lymph nodes. Incidental CT findings: Small RIGHT renal artery aneurysm measuring 13 mm (image 160/CT series 3). SKELETON: No focal hypermetabolic activity to suggest skeletal metastasis. Incidental CT findings: none IMPRESSION: 1. Hypermetabolic mass in the pancreas body consistent primary pancreatic neoplasm. 2. No evidence of metastatic adenopathy in the abdomen pelvis. 3. No evidence of liver metastasis. 4. Hypermetabolic nodule in the isthmus of the thyroid gland. Recommend thyroid ultrasound to exclude thyroid carcinoma. Electronically Signed   By: Suzy Bouchard M.D.   On: 09/30/2020 14:10     Assessment and plan- Patient is a 87 y.o. male with newly diagnosed adenocarcinoma of the body of the pancreas stage IB cT2 cN0 cM0 here to discuss further management  Patient was seen by pancreaticobiliary surgery Dr. Hyman Hopes at [facility].  I have personally discussed patient's case with Dr. Hyman Hopes.  Based on CT and PET scan findings patient has a stage Ib pancreatic adenocarcinoma with no locoregional lymph node adenopathy or celiac plexus involvement.  No evidence of distant metastatic disease.  As such his disease is upfront resectable.  There are 3 possible options moving forward and there is no clear data as to which approach would be the best in stage I setting in the absence of results from randomized clinical trials.  Option 1: Upfront surgery followed by adjuvant chemotherapy.  The concern with this approach is if the patient would be a candidate for adjuvant chemotherapy following surgery.  That could mean seeing a recurrence in distant metastatic disease as micrometastatic disease was not controlled with chemotherapy  Option 2: Neoadjuvant chemotherapy to control micrometastatic disease not visible on PET scan and following chemotherapy proceed with definitive surgery.  The problem with this approach is if the patient would be a  surgical candidate upon completion of chemotherapy  Option 3: Neoadjuvant chemotherapy to control micrometastatic disease followed by definitive radiation plus minus concurrent chemotherapy without surgery  Patient understands the pros and cons of each  approach.  It would be reasonable to proceed with neoadjuvant chemotherapy at this time.  Given patient's age and in order to maintain his quality of life I would not be in favor of offering modified FOLFIRINOX chemotherapy.  I would recommend neoadjuvant gemcitabine Abraxane chemotherapy given 3 weeks on 1 week off for 6 months.  Interim CT scan after 3 months to assess response to treatment and determine whether to proceed with surgery at the end of 3 months or completion of 3 more months of neoadjuvant chemotherapy.  Discussed data from [place] 001 as well as APACT trial which showed benefit for adjuvant chemotherapy especially combination chemotherapy which resulted in improvement in progression free survival as well as overall survival after definitive pancreatic resection.  Discussed risks and benefits of chemotherapy including all but not limited to nausea, vomiting, low blood counts, risk of infections and hospitalization.  Risk of peripheral neuropathy associated with Abraxane.  Treatment will be given with a curative intent.  Patient understands and agrees to proceed as planned.  We will decrease the dose of gemcitabine to 800 mg per metered squared and Abraxane to 100 mg per metered squared.  If patient is unable to tolerate 3 weeks on 1 week off approach I will reduce it to 2 weeks on 1 week off.  Patient and his family had multiple insightful questions and all of them were answered to their satisfaction.  Patient will need port placement as well as chemo teach prior to starting chemotherapy  Cancer Staging Malignant neoplasm of body of pancreas [facility]) Staging form: Exocrine Pancreas, AJCC 8th Edition - Clinical stage from 10/02/2020: Stage  IB (cT2, cN0, cM0) - Signed by Sindy Guadeloupe, MD on 10/04/2020 Total positive nodes: 0     Visit Diagnosis 1. Malignant neoplasm of body of pancreas ([place])   2. Goals of care, counseling/discussion      Dr. Randa Evens, MD, MPH [facility] [phone] 10/04/2020 7:20 PM

## 2020-10-05 ENCOUNTER — Other Ambulatory Visit: Payer: Self-pay | Admitting: Physician Assistant

## 2020-10-05 ENCOUNTER — Inpatient Hospital Stay: Payer: PPO

## 2020-10-05 ENCOUNTER — Other Ambulatory Visit: Payer: PPO

## 2020-10-05 ENCOUNTER — Other Ambulatory Visit: Payer: Self-pay

## 2020-10-05 DIAGNOSIS — C251 Malignant neoplasm of body of pancreas: Secondary | ICD-10-CM

## 2020-10-05 NOTE — Progress Notes (Signed)
I connected by phone with George Owens on 10/05/2020, 7:48 PM to discuss the potential use of a new treatment, tixagevimab/cilgavimab, for pre-exposure prophylaxis for prevention of coronavirus disease 2019 (COVID-19) caused by the SARS-CoV-2 virus.  This patient is a 87 y.o. male that meets the FDA criteria for Emergency Use Authorization of tixagevimab/cilgavimab for pre-exposure prophylaxis of COVID-19 disease. Pt meets following criteria:  Age >12 yr and weight > 40kg  Not currently infected with SARS-CoV-2 and has no known recent exposure to an individual infected with SARS-CoV-2 AND o Who has moderate to severe immune compromise due to a medical condition or receipt of immunosuppressive medications or treatments and may not mount an adequate immune response to COVID-19 vaccination or  o Vaccination with any available COVID-19 vaccine, according to the approved or authorized schedule, is not recommended due to a history of severe adverse reaction (e.g., severe allergic reaction) to a COVID-19 vaccine(s) and/or COVID-19 vaccine component(s).  o Patient meets the following definition of mod-severe immune compromised status: 7. Solid tumor malignancies on immunomodulatory chemotherapy or advanced AIDS   I have spoken and communicated the following to the patient or parent/caregiver regarding COVID monoclonal antibody treatment:  1. FDA has authorized the emergency use of tixagevimab/cilgavimab for the pre-exposure prophylaxis of COVID-19 in patients with moderate-severe immunocompromised status, who meet above EUA criteria.  2. The significant known and potential risks and benefits of COVID monoclonal antibody, and the extent to which such potential risks and benefits are unknown.  3. Information on available alternative treatments and the risks and benefits of those alternatives, including clinical trials.  4. The patient or parent/caregiver has the option to accept or refuse COVID monoclonal  antibody treatment.  After reviewing this information with the patient, agree to receive tixagevimab/cilgavimab  Angelena Form, PA-C, 10/05/2020, 7:48 PM

## 2020-10-05 NOTE — Patient Instructions (Signed)
Nanoparticle Albumin-Bound Paclitaxel injection What is this medicine? NANOPARTICLE ALBUMIN-BOUND PACLITAXEL (Na no PAHR ti kuhl al BYOO muhn-bound PAK li TAX el) is a chemotherapy drug. It targets fast dividing cells, like cancer cells, and causes these cells to die. This medicine is used to treat advanced breast cancer, lung cancer, and pancreatic cancer. This medicine may be used for other purposes; ask your health care provider or pharmacist if you have questions. COMMON BRAND NAME(S): Abraxane What should I tell my health care provider before I take this medicine? They need to know if you have any of these conditions:  kidney disease  liver disease  low blood counts, like low white cell, platelet, or red cell counts  lung or breathing disease, like asthma  tingling of the fingers or toes, or other nerve disorder  an unusual or allergic reaction to paclitaxel, albumin, other chemotherapy, other medicines, foods, dyes, or preservatives  pregnant or trying to get pregnant  breast-feeding How should I use this medicine? This drug is given as an infusion into a vein. It is administered in a hospital or clinic by a specially trained health care professional. Talk to your pediatrician regarding the use of this medicine in children. Special care may be needed. Overdosage: If you think you have taken too much of this medicine contact a poison control center or emergency room at once. NOTE: This medicine is only for you. Do not share this medicine with others. What if I miss a dose? It is important not to miss your dose. Call your doctor or health care professional if you are unable to keep an appointment. What may interact with this medicine? This medicine may interact with the following medications:  antiviral medicines for hepatitis, HIV or AIDS  certain antibiotics like erythromycin and clarithromycin  certain medicines for fungal infections like ketoconazole and  itraconazole  certain medicines for seizures like carbamazepine, phenobarbital, phenytoin  gemfibrozil  nefazodone  rifampin  [place]. Kutter's wort This list may not describe all possible interactions. Give your health care provider a list of all the medicines, herbs, non-prescription drugs, or dietary supplements you use. Also tell them if you smoke, drink alcohol, or use illegal drugs. Some items may interact with your medicine. What should I watch for while using this medicine? Your condition will be monitored carefully while you are receiving this medicine. You will need important blood work done while you are taking this medicine. This medicine can cause serious allergic reactions. If you experience allergic reactions like skin rash, itching or hives, swelling of the face, lips, or tongue, tell your doctor or health care professional right away. In some cases, you may be given additional medicines to help with side effects. Follow all directions for their use. This drug may make you feel generally unwell. This is not uncommon, as chemotherapy can affect healthy cells as well as cancer cells. Report any side effects. Continue your course of treatment even though you feel ill unless your doctor tells you to stop. Call your doctor or health care professional for advice if you get a fever, chills or sore throat, or other symptoms of a cold or flu. Do not treat yourself. This drug decreases your body's ability to fight infections. Try to avoid being around people who are sick. This medicine may increase your risk to bruise or bleed. Call your doctor or health care professional if you notice any unusual bleeding. Be careful brushing and flossing your teeth or using a toothpick because you may   get an infection or bleed more easily. If you have any dental work done, tell your dentist you are receiving this medicine. Avoid taking products that contain aspirin, acetaminophen, ibuprofen, naproxen, or  ketoprofen unless instructed by your doctor. These medicines may hide a fever. Do not become pregnant while taking this medicine or for 6 months after stopping it. Women should inform their doctor if they wish to become pregnant or think they might be pregnant. Men should not father a child while taking this medicine or for 3 months after stopping it. There is a potential for serious side effects to an unborn child. Talk to your health care professional or pharmacist for more information. Do not breast-feed an infant while taking this medicine or for 2 weeks after stopping it. This medicine may interfere with the ability to get pregnant or to father a child. You should talk to your doctor or health care professional if you are concerned about your fertility. What side effects may I notice from receiving this medicine? Side effects that you should report to your doctor or health care professional as soon as possible:  allergic reactions like skin rash, itching or hives, swelling of the face, lips, or tongue  breathing problems  changes in vision  fast, irregular heartbeat  low blood pressure  mouth sores  pain, tingling, numbness in the hands or feet  signs of decreased platelets or bleeding - bruising, pinpoint red spots on the skin, black, tarry stools, blood in the urine  signs of decreased red blood cells - unusually weak or tired, feeling faint or lightheaded, falls  signs of infection - fever or chills, cough, sore throat, pain or difficulty passing urine  signs and symptoms of liver injury like dark yellow or brown urine; general ill feeling or flu-like symptoms; light-colored stools; loss of appetite; nausea; right upper belly pain; unusually weak or tired; yellowing of the eyes or skin  swelling of the ankles, feet, hands  unusually slow heartbeat Side effects that usually do not require medical attention (report to your doctor or health care professional if they continue or  are bothersome):  diarrhea  hair loss  loss of appetite  nausea, vomiting  tiredness This list may not describe all possible side effects. Call your doctor for medical advice about side effects. You may report side effects to FDA at 1-800-FDA-1088. Where should I keep my medicine? This drug is given in a hospital or clinic and will not be stored at home. NOTE: This sheet is a summary. It may not cover all possible information. If you have questions about this medicine, talk to your doctor, pharmacist, or health care provider.  2021 Elsevier/Gold Standard (2017-02-07 13:03:45) Gemcitabine injection What is this medicine? GEMCITABINE (jem SYE ta been) is a chemotherapy drug. This medicine is used to treat many types of cancer like breast cancer, lung cancer, pancreatic cancer, and ovarian cancer. This medicine may be used for other purposes; ask your health care provider or pharmacist if you have questions. COMMON BRAND NAME(S): Gemzar, Infugem What should I tell my health care provider before I take this medicine? They need to know if you have any of these conditions:  blood disorders  infection  kidney disease  liver disease  lung or breathing disease, like asthma  recent or ongoing radiation therapy  an unusual or allergic reaction to gemcitabine, other chemotherapy, other medicines, foods, dyes, or preservatives  pregnant or trying to get pregnant  breast-feeding How should I use this medicine?  This drug is given as an infusion into a vein. It is administered in a hospital or clinic by a specially trained health care professional. Talk to your pediatrician regarding the use of this medicine in children. Special care may be needed. Overdosage: If you think you have taken too much of this medicine contact a poison control center or emergency room at once. NOTE: This medicine is only for you. Do not share this medicine with others. What if I miss a dose? It is important  not to miss your dose. Call your doctor or health care professional if you are unable to keep an appointment. What may interact with this medicine?  medicines to increase blood counts like filgrastim, pegfilgrastim, sargramostim  some other chemotherapy drugs like cisplatin  vaccines Talk to your doctor or health care professional before taking any of these medicines:  acetaminophen  aspirin  ibuprofen  ketoprofen  naproxen This list may not describe all possible interactions. Give your health care provider a list of all the medicines, herbs, non-prescription drugs, or dietary supplements you use. Also tell them if you smoke, drink alcohol, or use illegal drugs. Some items may interact with your medicine. What should I watch for while using this medicine? Visit your doctor for checks on your progress. This drug may make you feel generally unwell. This is not uncommon, as chemotherapy can affect healthy cells as well as cancer cells. Report any side effects. Continue your course of treatment even though you feel ill unless your doctor tells you to stop. In some cases, you may be given additional medicines to help with side effects. Follow all directions for their use. Call your doctor or health care professional for advice if you get a fever, chills or sore throat, or other symptoms of a cold or flu. Do not treat yourself. This drug decreases your body's ability to fight infections. Try to avoid being around people who are sick. This medicine may increase your risk to bruise or bleed. Call your doctor or health care professional if you notice any unusual bleeding. Be careful brushing and flossing your teeth or using a toothpick because you may get an infection or bleed more easily. If you have any dental work done, tell your dentist you are receiving this medicine. Avoid taking products that contain aspirin, acetaminophen, ibuprofen, naproxen, or ketoprofen unless instructed by your doctor.  These medicines may hide a fever. Do not become pregnant while taking this medicine or for 6 months after stopping it. Women should inform their doctor if they wish to become pregnant or think they might be pregnant. Men should not father a child while taking this medicine and for 3 months after stopping it. There is a potential for serious side effects to an unborn child. Talk to your health care professional or pharmacist for more information. Do not breast-feed an infant while taking this medicine or for at least 1 week after stopping it. Men should inform their doctors if they wish to father a child. This medicine may lower sperm counts. Talk with your doctor or health care professional if you are concerned about your fertility. What side effects may I notice from receiving this medicine? Side effects that you should report to your doctor or health care professional as soon as possible:  allergic reactions like skin rash, itching or hives, swelling of the face, lips, or tongue  breathing problems  pain, redness, or irritation at site where injected  signs and symptoms of a dangerous change  in heartbeat or heart rhythm like chest pain; dizziness; fast or irregular heartbeat; palpitations; feeling faint or lightheaded, falls; breathing problems  signs of decreased platelets or bleeding - bruising, pinpoint red spots on the skin, black, tarry stools, blood in the urine  signs of decreased red blood cells - unusually weak or tired, feeling faint or lightheaded, falls  signs of infection - fever or chills, cough, sore throat, pain or difficulty passing urine  signs and symptoms of kidney injury like trouble passing urine or change in the amount of urine  signs and symptoms of liver injury like dark yellow or brown urine; general ill feeling or flu-like symptoms; light-colored stools; loss of appetite; nausea; right upper belly pain; unusually weak or tired; yellowing of the eyes or  skin  swelling of ankles, feet, hands Side effects that usually do not require medical attention (report to your doctor or health care professional if they continue or are bothersome):  constipation  diarrhea  hair loss  loss of appetite  nausea  rash  vomiting This list may not describe all possible side effects. Call your doctor for medical advice about side effects. You may report side effects to FDA at 1-800-FDA-1088. Where should I keep my medicine? This drug is given in a hospital or clinic and will not be stored at home. NOTE: This sheet is a summary. It may not cover all possible information. If you have questions about this medicine, talk to your doctor, pharmacist, or health care provider.  2021 Elsevier/Gold Standard (2017-08-30 18:06:11)

## 2020-10-06 ENCOUNTER — Other Ambulatory Visit: Payer: Self-pay

## 2020-10-06 ENCOUNTER — Ambulatory Visit: Payer: PPO | Admitting: Gastroenterology

## 2020-10-06 ENCOUNTER — Telehealth: Payer: Self-pay | Admitting: *Deleted

## 2020-10-06 ENCOUNTER — Encounter: Payer: Self-pay | Admitting: Gastroenterology

## 2020-10-06 ENCOUNTER — Other Ambulatory Visit: Payer: Self-pay | Admitting: *Deleted

## 2020-10-06 ENCOUNTER — Telehealth: Payer: Self-pay

## 2020-10-06 VITALS — BP 123/71 | HR 80 | Ht 76.0 in | Wt 189.2 lb

## 2020-10-06 DIAGNOSIS — C259 Malignant neoplasm of pancreas, unspecified: Secondary | ICD-10-CM

## 2020-10-06 DIAGNOSIS — R194 Change in bowel habit: Secondary | ICD-10-CM | POA: Diagnosis not present

## 2020-10-06 DIAGNOSIS — R140 Abdominal distension (gaseous): Secondary | ICD-10-CM | POA: Diagnosis not present

## 2020-10-06 MED ORDER — PANCRELIPASE (LIP-PROT-AMYL) 36000-114000 UNITS PO CPEP: ORAL_CAPSULE | ORAL | 2 refills | Status: DC

## 2020-10-06 NOTE — Telephone Encounter (Signed)
Pt called and states that he starts chemo Thursday and wants to know if he should still get the electrocardiogram scheduled for Friday. He states that his oncologist does not see an issue with it but wanted to run it by Dr Derrel Nip first. Please advise

## 2020-10-06 NOTE — Telephone Encounter (Signed)
Message is not clear: I can't tell if his onoclogist says thereis no problem getting it? Or that he doesn't need it " If he feels up to having the ECHO,  He can have it,  But it his oncologist does not want him going to the procedure right now because of the chemo,  I agree that it can be postponed

## 2020-10-06 NOTE — Telephone Encounter (Signed)
Patient called asking about the Port insertion appointment and said he would like to speak with Judeen Hammans since Steffanie Dunn is not available. I do not see it scheduled. He said to call his home number

## 2020-10-06 NOTE — Telephone Encounter (Signed)
George Owens called me and said port insertion can be 4/25 arrival [facility]. Covid test 4/21. I called pt to go over it and he was in the middle of a call to his pharmacy and he said it will work and he will change his PCP appt for that day and we can give him the details when he comes on 4/21. I did tell him that he will need to get covid test after his chemo appt. He is agreeable

## 2020-10-06 NOTE — Telephone Encounter (Signed)
Spoke with pt and informed him of the message below. Pt stated that he would reach back out to Dr. Janese Banks.

## 2020-10-06 NOTE — Progress Notes (Signed)
Cephas Darby, MD [address]  [place]  [place], [place] [address]  Main: [phone]  Fax: [phone]    Gastroenterology Consultation  Referring Provider:     Crecencio Mc, MD Primary Care Physician:  Crecencio Mc, MD Primary Gastroenterologist:  Dr. Cephas Darby Reason for Consultation:     Altered bowel habits, abdominal bloating        HPI:   George Owens is a 87 y.o. male referred by Dr. Crecencio Mc, MD  for consultation & management of altered bowel habits and abdominal bloating.  Patient reports that he has been experiencing abdominal bloating, particularly postprandial associated with variable stool consistency anywhere from normal caliber to pencil thin and sometimes hard.  He underwent colonoscopy in 4/21 which revealed polyps and diverticulosis of the sigmoid colon as well as internal hemorrhoids.  Patient also had rectal pain, was treated with hemorrhoid cream.  He is not concerned about rectal pain today.  Patient is also diagnosed with cirrhosis of liver based on CT chest and followed by ultrasound liver.  He does have new onset of mild thrombocytopenia, 142 in 4/21.  Patient underwent work-up for secondary liver disease, found to have ANA positive and elevated ferritin <500  Follow-up visit 10/06/2020 Patient is diagnosed with adenocarcinoma of pancreas, stage I, resectable, planning to start neoadjuvant chemo.  He continues to have abdominal bloating and loose stools, primarily postprandial.  His weight is stable.  NSAIDs: None  Antiplts/Anticoagulants/Anti thrombotics: None  GI Procedures:  Upper endoscopy 11/29/2018 - Normal esophagus. - No endoscopic esophageal abnormality to explain patient's dysphagia. Dilation attempted, but aborted due to resistance with Savary and known Zenker's diverticulum. - Multiple gastric polyps. Biopsied. - Normal examined duodenum.  Surgical [P], gastric polyp BX - HYPERPLASTIC POLYP. - WARTHIN-STARRY  IS NEGATIVE FOR HELICOBACTER PYLORI. - NO INTESTINAL METAPLASIA, DYSPLASIA, OR MALIGNANCY.  Colonoscopy 10/09/2019 - Nine 3 to 6 mm polyps in the sigmoid colon, in the ascending colon and in the cecum, removed with a cold snare. Resected and retrieved. - Diverticulosis in the sigmoid colon. - Non-bleeding internal hemorrhoids.  Diagnosis Surgical [P], colon, ascending and cecum, sigmoid, polyp (9) - TUBULAR ADENOMA(S) WITHOUT HIGH-GRADE DYSPLASIA OR MALIGNANCY - INFLAMMATORY POLYP(S)  Past Medical History:  Diagnosis Date  . Allergy   . Basal cell carcinoma    right temple Dr. Evorn Gong 12/2018  . Chicken pox   . Colon polyps   . COVID-19 virus infection 03/16/2020  . GERD (gastroesophageal reflux disease)   . history of prostate CA 2006  . Hyperlipidemia   . Leg cramps   . Neuropathy   . Osteoporosis     Past Surgical History:  Procedure Laterality Date  . CATARACT EXTRACTION Right   . CATARACT EXTRACTION W/PHACO Left 12/26/2017   Procedure: CATARACT EXTRACTION PHACO AND INTRAOCULAR LENS PLACEMENT ([place])  COMPLICATED LEFT;  Surgeon: Leandrew Koyanagi, MD;  Location: [facility];  Service: Ophthalmology;  Laterality: Left;  [place]  . colonoscopy    . EYE MUSCLE SURGERY Left   . TONSILLECTOMY AND ADENOIDECTOMY      Current Outpatient Medications:  .  Calcium Carb-Cholecalciferol (CALCIUM 1000 + D PO), Take 1 tablet by mouth daily. , Disp: , Rfl:  .  Cholecalciferol 50 MCG (2000 UT) CAPS, Take by mouth., Disp: , Rfl:  .  Denosumab (PROLIA [place]), Inject into the skin every 6 (six) months., Disp: , Rfl:  .  glucosamine-chondroitin 500-400 MG tablet, Take 1  tablet by mouth daily., Disp: , Rfl:  .  lidocaine-prilocaine (EMLA) cream, Apply to affected area once, Disp: 30 g, Rfl: 3 .  lipase/protease/amylase (CREON) 36000 UNITS CPEP capsule, Take 1 capsule with the first bite of each meal and 1 capsule with the first bite of each snack, Disp: 180 capsule, Rfl:  2 .  loratadine (CLARITIN) 10 MG tablet, Take 10 mg by mouth daily as needed. , Disp: , Rfl:  .  LORazepam (ATIVAN) 0.5 MG tablet, Take 1 tablet (0.5 mg total) by mouth every 6 (six) hours as needed (Nausea or vomiting)., Disp: 30 tablet, Rfl: 0 .  naproxen sodium (ALEVE) 220 MG tablet, Take by mouth., Disp: , Rfl:  .  pantoprazole (PROTONIX) 40 MG tablet, Take 1 tablet (40 mg total) by mouth 2 (two) times daily., Disp: 60 tablet, Rfl: 0 .  pramoxine-hydrocortisone (PROCTOCREAM-HC) 1-1 % rectal cream, PLACE 1 APPLICATION RECTALLY 2 (TWO) TIMES DAILY., Disp: 30 g, Rfl: 0 .  Ranibizumab (LUCENTIS IO), Inject into the eye as needed. As needed every three months., Disp: , Rfl:  .  traZODone (DESYREL) 50 MG tablet, TAKE 0.5 TABLETS (25 MG TOTAL) BY MOUTH AT BEDTIME. MAY INCREASE TO FULL TABLET IF NEEDED, Disp: 90 tablet, Rfl: 0 .  vitamin B-12 (CYANOCOBALAMIN) 1000 MCG tablet, Take 1,000 mcg by mouth in the morning and at bedtime., Disp: , Rfl:    Family History  Problem Relation Age of Onset  . Stroke Mother   . Arthritis Father   . Heart disease Father   . Brain cancer Brother        1/2 brother (possibly related to mother NOT related to patient)  . Colon cancer Neg Hx   . Esophageal cancer Neg Hx   . Pancreatic cancer Neg Hx   . Stomach cancer Neg Hx   . Liver disease Neg Hx      Social History   Tobacco Use  . Smoking status: Never Smoker  . Smokeless tobacco: Never Used  Vaping Use  . Vaping Use: Never used  Substance Use Topics  . Alcohol use: No  . Drug use: No    Allergies as of 10/06/2020 - Review Complete 10/06/2020  Allergen Reaction Noted  . Kenalog [triamcinolone acetonide]  12/13/2012    Review of Systems:    All systems reviewed and negative except where noted in HPI.   Physical Exam:  BP 123/71 (BP Location: Left Arm, Patient Position: Sitting, Cuff Size: Normal)   Pulse 80   Ht 6\' 4"  (1.93 m)   Wt 189 lb 4 oz (85.8 kg)   BMI 23.04 kg/m  No LMP for male  patient.  General:   Alert,  Well-developed, well-nourished, pleasant and cooperative in NAD Head:  Normocephalic and atraumatic. Eyes:  Sclera clear, no icterus.   Conjunctiva pink. Ears:  Normal auditory acuity. Nose:  No deformity, discharge, or lesions. Mouth:  No deformity or lesions,oropharynx pink & moist. Neck:  Supple; no masses or thyromegaly. Lungs:  Respirations even and unlabored.  Clear throughout to auscultation.   No wheezes, crackles, or rhonchi. No acute distress. Heart:  Regular rate and rhythm; no murmurs, clicks, rubs, or gallops. Abdomen:  Normal bowel sounds. Soft, non-tender and non-distended without masses, hepatosplenomegaly or hernias noted.  No guarding or rebound tenderness.   Rectal: Not performed Msk:  Symmetrical without gross deformities. Good, equal movement & strength bilaterally. Pulses:  Normal pulses noted. Extremities:  No clubbing or edema.  No cyanosis. Neurologic:  Alert  and oriented x3;  grossly normal neurologically. Skin:  Intact without significant lesions or rashes. No jaundice. Psych:  Alert and cooperative. Normal mood and affect.  Imaging Studies: Reviewed  Assessment and Plan:   George Owens is a 87 y.o. Caucasian male with no significant past medical history is seen in consultation for abdominal bloating and altered bowel habits, new diagnosis of cirrhosis as well as stage I pancreatic adenocarcinoma  Abdomen bloating and altered bowel habits Patient already had a colonoscopy in 09/2019 which was unremarkable except for polyps and sigmoid diverticulosis and internal hemorrhoids H. 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

## 2020-10-06 NOTE — Telephone Encounter (Deleted)
Patient called back and we spoke, I saw that [facility] had his port scheduled for Monday and patient and I went over his appts. He is scheduled for a lab/ doctor vist 4/26 that I think is an old appointment. Please verify

## 2020-10-07 ENCOUNTER — Encounter: Payer: Self-pay | Admitting: Gastroenterology

## 2020-10-07 ENCOUNTER — Inpatient Hospital Stay: Payer: PPO

## 2020-10-07 DIAGNOSIS — R197 Diarrhea, unspecified: Secondary | ICD-10-CM | POA: Diagnosis not present

## 2020-10-07 DIAGNOSIS — R140 Abdominal distension (gaseous): Secondary | ICD-10-CM | POA: Diagnosis not present

## 2020-10-08 ENCOUNTER — Telehealth: Payer: Self-pay | Admitting: *Deleted

## 2020-10-08 ENCOUNTER — Other Ambulatory Visit: Payer: Self-pay

## 2020-10-08 ENCOUNTER — Encounter: Payer: Self-pay | Admitting: Oncology

## 2020-10-08 ENCOUNTER — Other Ambulatory Visit: Admission: RE | Admit: 2020-10-08 | Payer: PPO | Source: Ambulatory Visit

## 2020-10-08 ENCOUNTER — Inpatient Hospital Stay: Payer: PPO

## 2020-10-08 ENCOUNTER — Inpatient Hospital Stay (HOSPITAL_BASED_OUTPATIENT_CLINIC_OR_DEPARTMENT_OTHER): Payer: PPO | Admitting: Oncology

## 2020-10-08 VITALS — BP 123/70 | HR 75 | Temp 97.0°F | Resp 18 | Wt 189.0 lb

## 2020-10-08 VITALS — BP 119/63 | HR 66 | Temp 97.3°F

## 2020-10-08 DIAGNOSIS — C251 Malignant neoplasm of body of pancreas: Secondary | ICD-10-CM

## 2020-10-08 DIAGNOSIS — Z5111 Encounter for antineoplastic chemotherapy: Secondary | ICD-10-CM | POA: Diagnosis not present

## 2020-10-08 DIAGNOSIS — Z298 Encounter for other specified prophylactic measures: Secondary | ICD-10-CM | POA: Diagnosis not present

## 2020-10-08 DIAGNOSIS — I722 Aneurysm of renal artery: Secondary | ICD-10-CM | POA: Diagnosis not present

## 2020-10-08 DIAGNOSIS — Z9221 Personal history of antineoplastic chemotherapy: Secondary | ICD-10-CM | POA: Diagnosis not present

## 2020-10-08 DIAGNOSIS — K7460 Unspecified cirrhosis of liver: Secondary | ICD-10-CM | POA: Diagnosis not present

## 2020-10-08 DIAGNOSIS — K219 Gastro-esophageal reflux disease without esophagitis: Secondary | ICD-10-CM | POA: Diagnosis not present

## 2020-10-08 DIAGNOSIS — E785 Hyperlipidemia, unspecified: Secondary | ICD-10-CM | POA: Diagnosis not present

## 2020-10-08 DIAGNOSIS — Z8546 Personal history of malignant neoplasm of prostate: Secondary | ICD-10-CM | POA: Diagnosis not present

## 2020-10-08 LAB — HEPATITIS B CORE ANTIBODY, TOTAL: Hep B Core Total Ab: NONREACTIVE

## 2020-10-08 LAB — COMPREHENSIVE METABOLIC PANEL WITH GFR
ALT: 37 U/L (ref 0–44)
AST: 31 U/L (ref 15–41)
Albumin: 4 g/dL (ref 3.5–5.0)
Alkaline Phosphatase: 37 U/L — ABNORMAL LOW (ref 38–126)
Anion gap: 11 (ref 5–15)
BUN: 27 mg/dL — ABNORMAL HIGH (ref 8–23)
CO2: 28 mmol/L (ref 22–32)
Calcium: 9.2 mg/dL (ref 8.9–10.3)
Chloride: 102 mmol/L (ref 98–111)
Creatinine, Ser: 0.97 mg/dL (ref 0.61–1.24)
GFR, Estimated: >60 mL/min
Glucose, Bld: 126 mg/dL — ABNORMAL HIGH (ref 70–99)
Potassium: 3.8 mmol/L (ref 3.5–5.1)
Sodium: 141 mmol/L (ref 135–145)
Total Bilirubin: 0.9 mg/dL (ref 0.3–1.2)
Total Protein: 6.7 g/dL (ref 6.5–8.1)

## 2020-10-08 LAB — CBC WITH DIFFERENTIAL/PLATELET
Abs Immature Granulocytes: 0.01 K/uL (ref 0.00–0.07)
Basophils Absolute: 0 K/uL (ref 0.0–0.1)
Basophils Relative: 1 %
Eosinophils Absolute: 0.1 K/uL (ref 0.0–0.5)
Eosinophils Relative: 1 %
HCT: 41.1 % (ref 39.0–52.0)
Hemoglobin: 13.7 g/dL (ref 13.0–17.0)
Immature Granulocytes: 0 %
Lymphocytes Relative: 31 %
Lymphs Abs: 1.2 K/uL (ref 0.7–4.0)
MCH: 30.3 pg (ref 26.0–34.0)
MCHC: 33.3 g/dL (ref 30.0–36.0)
MCV: 90.9 fL (ref 80.0–100.0)
Monocytes Absolute: 0.3 K/uL (ref 0.1–1.0)
Monocytes Relative: 9 %
Neutro Abs: 2.2 K/uL (ref 1.7–7.7)
Neutrophils Relative %: 58 %
Platelets: 136 K/uL — ABNORMAL LOW (ref 150–400)
RBC: 4.52 MIL/uL (ref 4.22–5.81)
RDW: 13 % (ref 11.5–15.5)
WBC: 3.8 K/uL — ABNORMAL LOW (ref 4.0–10.5)
nRBC: 0 % (ref 0.0–0.2)

## 2020-10-08 LAB — HEPATITIS B SURFACE ANTIGEN: Hepatitis B Surface Ag: NONREACTIVE

## 2020-10-08 MED ORDER — TIXAGEVIMAB (PART OF EVUSHELD) INJECTION
300.0000 mg | Freq: Once | INTRAMUSCULAR | Status: AC
  Administered 2020-10-08: 300 mg via INTRAMUSCULAR
  Filled 2020-10-08: qty 3

## 2020-10-08 MED ORDER — PACLITAXEL PROTEIN-BOUND CHEMO INJECTION 100 MG
100.0000 mg/m2 | Freq: Once | INTRAVENOUS | Status: AC
  Administered 2020-10-08: 225 mg via INTRAVENOUS
  Filled 2020-10-08: qty 45

## 2020-10-08 MED ORDER — SODIUM CHLORIDE 0.9 % IV SOLN
1800.0000 mg | Freq: Once | INTRAVENOUS | Status: AC
  Administered 2020-10-08: 1800 mg via INTRAVENOUS
  Filled 2020-10-08: qty 26.3

## 2020-10-08 MED ORDER — SODIUM CHLORIDE 0.9 % IV SOLN
Freq: Once | INTRAVENOUS | Status: AC
  Filled 2020-10-08: qty 250

## 2020-10-08 MED ORDER — CILGAVIMAB (PART OF EVUSHELD) INJECTION
300.0000 mg | Freq: Once | INTRAMUSCULAR | Status: AC
Start: 2020-10-08 — End: 2020-10-08
  Administered 2020-10-08: 300 mg via INTRAMUSCULAR
  Filled 2020-10-08: qty 3

## 2020-10-08 MED ORDER — PROCHLORPERAZINE MALEATE 10 MG PO TABS
10.0000 mg | ORAL_TABLET | Freq: Once | ORAL | Status: AC
Start: 2020-10-08 — End: 2020-10-08
  Administered 2020-10-08: 10 mg via ORAL
  Filled 2020-10-08: qty 1

## 2020-10-08 NOTE — Progress Notes (Signed)
Per Dr. Janese Banks proceed with Rexford Maus and chem treatment as scheduled. **See Angelena Form PA note on 10/05/20 regarding Evusheld**  Per Elmon Else [facility] okay to give Evushled prior to Chemo and okay to give on the same day.    1200: Pt tolerated Evusheld injections well with no s/s of distress or reaction noted. Pt monitored 1 hour post injections as ordered. Pt and VS remain stable and injection sites WNL, with no swelling or redness noted.    1330: Pt tolerated infusion well. No s/s of distress or reaction noted. Pt stable at time of discharge.

## 2020-10-08 NOTE — H&P (View-Only) (Signed)
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 No extrapancreatic infiltrative density about the aorta or proximal [name]. No local regional adenopathy or distant metastatic disease. 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 This was followed by an EUS at [facility] which showed a hypoechoic mass measuring 1.8 x 1.6 cm in the pancreatic body. Endosonographic borders poorly defined. Upstream pancreatic duct dilatation. No abnormal appearing lymph nodes. Endosonographic imaging of the liver showed no lesion. Biopsy showed adenocarcinoma.  PET scan showed 2.7 x 2 cm ovoid mass in the body of the pancreas with an SUV of 7.1.  18 mm hypermetabolic nodule in the isthmus of the thyroid gland.  No evidence of local regional adenopathy or distant metastatic disease.  Interval history-patient reports that his symptoms of indigestion are better after he started using pancreatic enzyme supplements.  He has cut down on his dose of Protonix.  Denies any other complaints at this time  ECOG PS- 1 Pain scale- 0   Review of systems- Review of Systems  Constitutional: Negative for chills, fever, malaise/fatigue and weight  loss.  HENT: Negative for congestion, ear discharge and nosebleeds.   Eyes: Negative for blurred vision.  Respiratory: Negative for cough, hemoptysis, sputum production, shortness of breath and wheezing.   Cardiovascular: Negative for chest pain, palpitations, orthopnea and claudication.  Gastrointestinal: Negative for abdominal pain, blood in stool, constipation, diarrhea, heartburn, melena, nausea and vomiting.  Genitourinary: Negative for dysuria, flank pain, frequency, hematuria and urgency.  Musculoskeletal: Negative for back pain, joint pain and myalgias.  Skin: Negative for rash.  Neurological: Negative for dizziness, tingling, focal weakness, seizures, weakness and headaches.  Endo/Heme/Allergies: Does not bruise/bleed easily.  Psychiatric/Behavioral: Negative for depression and suicidal ideas. The patient does not have insomnia.       Allergies  Allergen Reactions  . Kenalog [Triamcinolone Acetonide]     Blindness X 3 Days     Past Medical History:  Diagnosis Date  . Allergy   . Basal cell carcinoma    right temple Dr. Evorn Gong 12/2018  . Chicken pox   . Colon polyps   . COVID-19 virus infection 03/16/2020  . GERD (gastroesophageal reflux disease)   . history of prostate CA 2006  . Hyperlipidemia   . Leg cramps   . Neuropathy   . Osteoporosis      Past Surgical History:  Procedure Laterality Date  . CATARACT EXTRACTION Right   . CATARACT EXTRACTION W/PHACO Left 12/26/2017   Procedure: CATARACT EXTRACTION PHACO AND INTRAOCULAR LENS PLACEMENT ([place])  COMPLICATED LEFT;  Surgeon: Leandrew Koyanagi, MD;  Location: [facility];  Service: Ophthalmology;  Laterality: Left;  [place]  . colonoscopy    . EYE MUSCLE SURGERY Left   . TONSILLECTOMY AND ADENOIDECTOMY  Social History   Socioeconomic History  . Marital status: Married    Spouse name: Eloise  . Number of children: 4  . Years of education: Not on file  . Highest education level: Not  on file  Occupational History  . Occupation: retired  Tobacco Use  . Smoking status: Never Smoker  . Smokeless tobacco: Never Used  Vaping Use  . Vaping Use: Never used  Substance and Sexual Activity  . Alcohol use: No  . Drug use: No  . Sexual activity: Yes    Partners: Female  Other Topics Concern  . Not on file  Social History Narrative   Lives with wife in a 5 story retirement home.  He lives on the first floor.  4 year college degree.  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     Social Determinants of Health   Financial Resource Strain: Low Risk   . Difficulty of Paying Living Expenses: Not hard at all  Food Insecurity: Not on file  Transportation Needs: No Transportation Needs  . Lack of Transportation (Medical): No  . Lack of Transportation (Non-Medical): No  Physical Activity: Not on file  Stress: No Stress Concern Present  . Feeling of Stress : Not at all  Social Connections: Unknown  . Frequency of Communication with Friends and Family: Not on file  . Frequency of Social Gatherings with Friends and Family: Not on file  . Attends Religious Services: Not on file  . Active Member of Clubs or Organizations: Not on file  . Attends Archivist Meetings: Not on file  . Marital Status: Married  Human resources officer Violence: Not on file    Family History  Problem Relation Age of Onset  . Stroke Mother   . Arthritis Father   . Heart disease Father   . Brain cancer Brother        1/2 brother (possibly related to mother NOT related to patient)  . Colon cancer Neg Hx   . Esophageal cancer Neg Hx   . Pancreatic cancer Neg Hx   . Stomach cancer Neg Hx   . Liver disease Neg Hx      Current Outpatient Medications:  .  Calcium Carb-Cholecalciferol (CALCIUM 1000 + D PO), Take 1 tablet by mouth daily. , Disp: , Rfl:  .  Cholecalciferol 50 MCG (2000 UT) CAPS, Take by mouth., Disp: , Rfl:  .  Denosumab (PROLIA Yoder), Inject into the skin  every 6 (six) months., Disp: , Rfl:  .  glucosamine-chondroitin 500-400 MG tablet, Take 1 tablet by mouth daily., Disp: , Rfl:  .  lidocaine-prilocaine (EMLA) cream, Apply to affected area once, Disp: 30 g, Rfl: 3 .  lipase/protease/amylase (CREON) 36000 UNITS CPEP capsule, Take 1 capsule with the first bite of each meal and 1 capsule with the first bite of each snack, Disp: 180 capsule, Rfl: 2 .  loratadine (CLARITIN) 10 MG tablet, Take 10 mg by mouth daily as needed. , Disp: , Rfl:  .  naproxen sodium (ALEVE) 220 MG tablet, Take by mouth., Disp: , Rfl:  .  pantoprazole (PROTONIX) 40 MG tablet, Take 1 tablet (40 mg total) by mouth 2 (two) times daily. (Patient taking differently: Take 40 mg by mouth daily. Tapering off), Disp: 60 tablet, Rfl: 0 .  pramoxine-hydrocortisone (PROCTOCREAM-HC) 1-1 % rectal cream, PLACE 1 APPLICATION RECTALLY 2 (TWO) TIMES DAILY., Disp: 30 g, Rfl: 0 .  prochlorperazine (COMPAZINE) 5 MG tablet, Take 5 mg by mouth  every 6 (six) hours as needed for nausea or vomiting., Disp: , Rfl:  .  Ranibizumab (LUCENTIS IO), Inject into the eye as needed. As needed every three months., Disp: , Rfl:  .  traZODone (DESYREL) 50 MG tablet, TAKE 0.5 TABLETS (25 MG TOTAL) BY MOUTH AT BEDTIME. MAY INCREASE TO FULL TABLET IF NEEDED, Disp: 90 tablet, Rfl: 0 .  vitamin B-12 (CYANOCOBALAMIN) 1000 MCG tablet, Take 1,000 mcg by mouth in the morning and at bedtime., Disp: , Rfl:  .  LORazepam (ATIVAN) 0.5 MG tablet, Take 1 tablet (0.5 mg total) by mouth every 6 (six) hours as needed (Nausea or vomiting). (Patient not taking: Reported on 10/08/2020), Disp: 30 tablet, Rfl: 0  Physical exam:  Vitals:   10/08/20 0911  BP: 123/70  Pulse: 75  Resp: 18  Temp: (!) 97 F (36.1 C)  TempSrc: Tympanic  SpO2: 100%  Weight: 189 lb (85.7 kg)   Physical Exam Constitutional:      General: He is not in acute distress. Cardiovascular:     Rate and Rhythm: Normal rate and regular rhythm.  Pulmonary:      Effort: Pulmonary effort is normal.  Skin:    General: Skin is warm and dry.  Neurological:     Mental Status: He is alert and oriented to person, place, and time.      CMP Latest Ref Rng & Units 10/08/2020  Glucose 70 - 99 mg/dL 126(H)  BUN 8 - 23 mg/dL 27(H)  Creatinine 0.61 - 1.24 mg/dL 0.97  Sodium 135 - 145 mmol/L 141  Potassium 3.5 - 5.1 mmol/L 3.8  Chloride 98 - 111 mmol/L 102  CO2 22 - 32 mmol/L 28  Calcium 8.9 - 10.3 mg/dL 9.2  Total Protein 6.5 - 8.1 g/dL 6.7  Total Bilirubin 0.3 - 1.2 mg/dL 0.9  Alkaline Phos 38 - 126 U/L 37(L)  AST 15 - 41 U/L 31  ALT 0 - 44 U/L 37   CBC Latest Ref Rng & Units 10/08/2020  WBC 4.0 - 10.5 K/uL 3.8(L)  Hemoglobin 13.0 - 17.0 g/dL 13.7  Hematocrit 39.0 - 52.0 % 41.1  Platelets 150 - 400 K/uL 136(L)    No images are attached to the encounter.  CT Abdomen Pelvis W Contrast  Result Date: 09/11/2020 CLINICAL DATA:  Acute nonlocalized abdominal pain EXAM: CT ABDOMEN AND PELVIS WITH CONTRAST TECHNIQUE: Multidetector CT imaging of the abdomen and pelvis was performed using the standard protocol following bolus administration of intravenous contrast. CONTRAST:  39mL OMNIPAQUE IOHEXOL 300 MG/ML  SOLN COMPARISON:  02/28/2013 FINDINGS: Lower chest:  No contributory findings. Hepatobiliary: No focal liver abnormality. Lobulated liver surface. There is history of cirrhosis in the chart.No evidence of biliary obstruction or stone. Pancreas: Hypointense ill-defined mass in the pancreatic body. Although difficult to visualize due to generalized pancreatic atrophy there is ductal dilatation in the more proximal tail. No generalized or prior noted inflammation to implicate pancreatitis. Dimensions measure up to 25 mm on axial slices. No extra pancreatic infiltrative density about the aorta or proximal viscera. Spleen: Unremarkable. Adrenals/Urinary Tract: Negative adrenals. No hydronephrosis or stone. Unremarkable bladder. Stomach/Bowel:  No obstruction. No  appendicitis. Vascular/Lymphatic: No acute vascular abnormality. Atheromatous calcification. 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 No mass or adenopathy. Reproductive:Fiducial markers at the posterior prostate. Other: No ascites or pneumoperitoneum. Musculoskeletal: No acute abnormalities. Remote L2 superior endplate fracture. Benign sclerosis in the intertrochanteric right femur. These results will be called to the ordering clinician or  representative by the Radiologist Assistant, and communication documented in the PACS or [employer]. IMPRESSION: 1. 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 Recommend GI follow-up for consideration of EUS. 2. No evidence of metastatic disease. 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 Electronically Signed   By: Monte Fantasia M.D.   On: 09/11/2020 09:57   NM PET Image Initial (PI) Skull Base To Thigh  Result Date: 09/30/2020 CLINICAL DATA:  Initial treatment strategy for pancreatic carcinoma. EXAM: NUCLEAR MEDICINE PET SKULL BASE TO THIGH TECHNIQUE: 10.0 mCi F-18 FDG was injected intravenously. Full-ring PET imaging was performed from the skull base to thigh after the radiotracer. CT data was obtained and used for attenuation correction and anatomic localization. Fasting blood glucose: 93 mg/dl COMPARISON:  CT abdomen 09/11/2020 FINDINGS: Mediastinal blood pool activity: SUV max 2.5 Liver activity: SUV max NA NECK: No hypermetabolic lymph nodes in the neck. Hypermetabolic 18 mm nodule within the isthmus of the thyroid gland (image 72/series 3) with SUV max equal 4.7. Incidental CT findings: none CHEST: No hypermetabolic mediastinal or hilar nodes. No suspicious pulmonary nodules on the CT scan. Incidental CT findings: none ABDOMEN/PELVIS: Within the mid body the pancreas, ovoid mass measures 2.7 by 2.0 cm (image 153) with SUV max equal 7.1. No additional lesions in the pancreas. No abnormal metabolic activity  in the liver. No hypermetabolic periportal peripancreatic lymph nodes. No periaortic retroperitoneal hypermetabolic lymph nodes or pelvic lymph nodes. Incidental CT findings: Small RIGHT renal artery aneurysm measuring 13 mm (image 160/CT series 3). SKELETON: No focal hypermetabolic activity to suggest skeletal metastasis. Incidental CT findings: none IMPRESSION: 1. Hypermetabolic mass in the pancreas body consistent primary pancreatic neoplasm. 2. No evidence of metastatic adenopathy in the abdomen pelvis. 3. No evidence of liver metastasis. 4. Hypermetabolic nodule in the isthmus of the thyroid gland. Recommend thyroid ultrasound to exclude thyroid carcinoma. Electronically Signed   By: Suzy Bouchard M.D.   On: 09/30/2020 14:10     Assessment and plan- Patient is a 87 y.o. 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  He is receiving gemcitabine at a reduced dose of 800 mg per metered square along with Abraxane 100 mg per metered squared due to his age and concerns for toxicity.  He will directly proceed for cycle 1 day 8 of gemcitabine and Abraxane next week and I will see him back in 2 weeks for cycle 1 day 15.  Discussed risks and effects of chemotherapy including all but not limited to nausea, vomiting, low blood counts, risk of infections and hospitalization.  Risk of peripheral neuropathy associated with Abraxane.  Treatment will be given with a curative intent.  Patient understands and agrees to proceed as planned   Visit Diagnosis 1. Malignant neoplasm of body of pancreas ([place])   2. Encounter for antineoplastic chemotherapy      Dr. Randa Evens, MD, MPH [facility] [phone] 10/08/2020 2:35 PM

## 2020-10-08 NOTE — Telephone Encounter (Signed)
Called pt based on a secure chat from brenda triage nurse stating that pt had questions about his hepatitis inj. And need covid  Vaccine. I called pt and told him the he got evusheld todat andhe does not need covid vaccine . He ended up saying that the evusheld is what he was calling covid vaccine. I explained that because he is going through chemo that if he got covid that the evusheld will help him have a lesser case or help him get better quicker. The hepatitis part was the labs that we drew this am. It is required with dr. Janese Banks to do hepatitis labs first time for anyone getting chemo. Now he understands .

## 2020-10-08 NOTE — Progress Notes (Signed)
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 No extrapancreatic infiltrative density about the aorta or proximal [name]. No local regional adenopathy or distant metastatic disease. 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 This was followed by an EUS at [facility] which showed a hypoechoic mass measuring 1.8 x 1.6 cm in the pancreatic body. Endosonographic borders poorly defined. Upstream pancreatic duct dilatation. No abnormal appearing lymph nodes. Endosonographic imaging of the liver showed no lesion. Biopsy showed adenocarcinoma.  PET scan showed 2.7 x 2 cm ovoid mass in the body of the pancreas with an SUV of 7.1.  18 mm hypermetabolic nodule in the isthmus of the thyroid gland.  No evidence of local regional adenopathy or distant metastatic disease.  Interval history-patient reports that his symptoms of indigestion are better after he started using pancreatic enzyme supplements.  He has cut down on his dose of Protonix.  Denies any other complaints at this time  ECOG PS- 1 Pain scale- 0   Review of systems- Review of Systems  Constitutional: Negative for chills, fever, malaise/fatigue and weight  loss.  HENT: Negative for congestion, ear discharge and nosebleeds.   Eyes: Negative for blurred vision.  Respiratory: Negative for cough, hemoptysis, sputum production, shortness of breath and wheezing.   Cardiovascular: Negative for chest pain, palpitations, orthopnea and claudication.  Gastrointestinal: Negative for abdominal pain, blood in stool, constipation, diarrhea, heartburn, melena, nausea and vomiting.  Genitourinary: Negative for dysuria, flank pain, frequency, hematuria and urgency.  Musculoskeletal: Negative for back pain, joint pain and myalgias.  Skin: Negative for rash.  Neurological: Negative for dizziness, tingling, focal weakness, seizures, weakness and headaches.  Endo/Heme/Allergies: Does not bruise/bleed easily.  Psychiatric/Behavioral: Negative for depression and suicidal ideas. The patient does not have insomnia.       Allergies  Allergen Reactions  . Kenalog [Triamcinolone Acetonide]     Blindness X 3 Days     Past Medical History:  Diagnosis Date  . Allergy   . Basal cell carcinoma    right temple Dr. Evorn Gong 12/2018  . Chicken pox   . Colon polyps   . COVID-19 virus infection 03/16/2020  . GERD (gastroesophageal reflux disease)   . history of prostate CA 2006  . Hyperlipidemia   . Leg cramps   . Neuropathy   . Osteoporosis      Past Surgical History:  Procedure Laterality Date  . CATARACT EXTRACTION Right   . CATARACT EXTRACTION W/PHACO Left 12/26/2017   Procedure: CATARACT EXTRACTION PHACO AND INTRAOCULAR LENS PLACEMENT ([place])  COMPLICATED LEFT;  Surgeon: Leandrew Koyanagi, MD;  Location: [facility];  Service: Ophthalmology;  Laterality: Left;  [place]  . colonoscopy    . EYE MUSCLE SURGERY Left   . TONSILLECTOMY AND ADENOIDECTOMY  Social History   Socioeconomic History  . Marital status: Married    Spouse name: Eloise  . Number of children: 4  . Years of education: Not on file  . Highest education level: Not  on file  Occupational History  . Occupation: retired  Tobacco Use  . Smoking status: Never Smoker  . Smokeless tobacco: Never Used  Vaping Use  . Vaping Use: Never used  Substance and Sexual Activity  . Alcohol use: No  . Drug use: No  . Sexual activity: Yes    Partners: Female  Other Topics Concern  . Not on file  Social History Narrative   Lives with wife in a 5 story retirement home.  He lives on the first floor.  4 year college degree.  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     Social Determinants of Health   Financial Resource Strain: Low Risk   . Difficulty of Paying Living Expenses: Not hard at all  Food Insecurity: Not on file  Transportation Needs: No Transportation Needs  . Lack of Transportation (Medical): No  . Lack of Transportation (Non-Medical): No  Physical Activity: Not on file  Stress: No Stress Concern Present  . Feeling of Stress : Not at all  Social Connections: Unknown  . Frequency of Communication with Friends and Family: Not on file  . Frequency of Social Gatherings with Friends and Family: Not on file  . Attends Religious Services: Not on file  . Active Member of Clubs or Organizations: Not on file  . Attends Archivist Meetings: Not on file  . Marital Status: Married  Human resources officer Violence: Not on file    Family History  Problem Relation Age of Onset  . Stroke Mother   . Arthritis Father   . Heart disease Father   . Brain cancer Brother        1/2 brother (possibly related to mother NOT related to patient)  . Colon cancer Neg Hx   . Esophageal cancer Neg Hx   . Pancreatic cancer Neg Hx   . Stomach cancer Neg Hx   . Liver disease Neg Hx      Current Outpatient Medications:  .  Calcium Carb-Cholecalciferol (CALCIUM 1000 + D PO), Take 1 tablet by mouth daily. , Disp: , Rfl:  .  Cholecalciferol 50 MCG (2000 UT) CAPS, Take by mouth., Disp: , Rfl:  .  Denosumab (PROLIA ), Inject into the skin  every 6 (six) months., Disp: , Rfl:  .  glucosamine-chondroitin 500-400 MG tablet, Take 1 tablet by mouth daily., Disp: , Rfl:  .  lidocaine-prilocaine (EMLA) cream, Apply to affected area once, Disp: 30 g, Rfl: 3 .  lipase/protease/amylase (CREON) 36000 UNITS CPEP capsule, Take 1 capsule with the first bite of each meal and 1 capsule with the first bite of each snack, Disp: 180 capsule, Rfl: 2 .  loratadine (CLARITIN) 10 MG tablet, Take 10 mg by mouth daily as needed. , Disp: , Rfl:  .  naproxen sodium (ALEVE) 220 MG tablet, Take by mouth., Disp: , Rfl:  .  pantoprazole (PROTONIX) 40 MG tablet, Take 1 tablet (40 mg total) by mouth 2 (two) times daily. (Patient taking differently: Take 40 mg by mouth daily. Tapering off), Disp: 60 tablet, Rfl: 0 .  pramoxine-hydrocortisone (PROCTOCREAM-HC) 1-1 % rectal cream, PLACE 1 APPLICATION RECTALLY 2 (TWO) TIMES DAILY., Disp: 30 g, Rfl: 0 .  prochlorperazine (COMPAZINE) 5 MG tablet, Take 5 mg by mouth  every 6 (six) hours as needed for nausea or vomiting., Disp: , Rfl:  .  Ranibizumab (LUCENTIS IO), Inject into the eye as needed. As needed every three months., Disp: , Rfl:  .  traZODone (DESYREL) 50 MG tablet, TAKE 0.5 TABLETS (25 MG TOTAL) BY MOUTH AT BEDTIME. MAY INCREASE TO FULL TABLET IF NEEDED, Disp: 90 tablet, Rfl: 0 .  vitamin B-12 (CYANOCOBALAMIN) 1000 MCG tablet, Take 1,000 mcg by mouth in the morning and at bedtime., Disp: , Rfl:  .  LORazepam (ATIVAN) 0.5 MG tablet, Take 1 tablet (0.5 mg total) by mouth every 6 (six) hours as needed (Nausea or vomiting). (Patient not taking: Reported on 10/08/2020), Disp: 30 tablet, Rfl: 0  Physical exam:  Vitals:   10/08/20 0911  BP: 123/70  Pulse: 75  Resp: 18  Temp: (!) 97 F (36.1 C)  TempSrc: Tympanic  SpO2: 100%  Weight: 189 lb (85.7 kg)   Physical Exam Constitutional:      General: He is not in acute distress. Cardiovascular:     Rate and Rhythm: Normal rate and regular rhythm.  Pulmonary:      Effort: Pulmonary effort is normal.  Skin:    General: Skin is warm and dry.  Neurological:     Mental Status: He is alert and oriented to person, place, and time.      CMP Latest Ref Rng & Units 10/08/2020  Glucose 70 - 99 mg/dL 126(H)  BUN 8 - 23 mg/dL 27(H)  Creatinine 0.61 - 1.24 mg/dL 0.97  Sodium 135 - 145 mmol/L 141  Potassium 3.5 - 5.1 mmol/L 3.8  Chloride 98 - 111 mmol/L 102  CO2 22 - 32 mmol/L 28  Calcium 8.9 - 10.3 mg/dL 9.2  Total Protein 6.5 - 8.1 g/dL 6.7  Total Bilirubin 0.3 - 1.2 mg/dL 0.9  Alkaline Phos 38 - 126 U/L 37(L)  AST 15 - 41 U/L 31  ALT 0 - 44 U/L 37   CBC Latest Ref Rng & Units 10/08/2020  WBC 4.0 - 10.5 K/uL 3.8(L)  Hemoglobin 13.0 - 17.0 g/dL 13.7  Hematocrit 39.0 - 52.0 % 41.1  Platelets 150 - 400 K/uL 136(L)    No images are attached to the encounter.  CT Abdomen Pelvis W Contrast  Result Date: 09/11/2020 CLINICAL DATA:  Acute nonlocalized abdominal pain EXAM: CT ABDOMEN AND PELVIS WITH CONTRAST TECHNIQUE: Multidetector CT imaging of the abdomen and pelvis was performed using the standard protocol following bolus administration of intravenous contrast. CONTRAST:  22mL OMNIPAQUE IOHEXOL 300 MG/ML  SOLN COMPARISON:  02/28/2013 FINDINGS: Lower chest:  No contributory findings. Hepatobiliary: No focal liver abnormality. Lobulated liver surface. There is history of cirrhosis in the chart.No evidence of biliary obstruction or stone. Pancreas: Hypointense ill-defined mass in the pancreatic body. Although difficult to visualize due to generalized pancreatic atrophy there is ductal dilatation in the more proximal tail. No generalized or prior noted inflammation to implicate pancreatitis. Dimensions measure up to 25 mm on axial slices. No extra pancreatic infiltrative density about the aorta or proximal viscera. Spleen: Unremarkable. Adrenals/Urinary Tract: Negative adrenals. No hydronephrosis or stone. Unremarkable bladder. Stomach/Bowel:  No obstruction. No  appendicitis. Vascular/Lymphatic: No acute vascular abnormality. Atheromatous calcification. 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 No mass or adenopathy. Reproductive:Fiducial markers at the posterior prostate. Other: No ascites or pneumoperitoneum. Musculoskeletal: No acute abnormalities. Remote L2 superior endplate fracture. Benign sclerosis in the intertrochanteric right femur. These results will be called to the ordering clinician or  representative by the Radiologist Assistant, and communication documented in the PACS or [employer]. IMPRESSION: 1. 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 Recommend GI follow-up for consideration of EUS. 2. No evidence of metastatic disease. 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 Electronically Signed   By: Monte Fantasia M.D.   On: 09/11/2020 09:57   NM PET Image Initial (PI) Skull Base To Thigh  Result Date: 09/30/2020 CLINICAL DATA:  Initial treatment strategy for pancreatic carcinoma. EXAM: NUCLEAR MEDICINE PET SKULL BASE TO THIGH TECHNIQUE: 10.0 mCi F-18 FDG was injected intravenously. Full-ring PET imaging was performed from the skull base to thigh after the radiotracer. CT data was obtained and used for attenuation correction and anatomic localization. Fasting blood glucose: 93 mg/dl COMPARISON:  CT abdomen 09/11/2020 FINDINGS: Mediastinal blood pool activity: SUV max 2.5 Liver activity: SUV max NA NECK: No hypermetabolic lymph nodes in the neck. Hypermetabolic 18 mm nodule within the isthmus of the thyroid gland (image 72/series 3) with SUV max equal 4.7. Incidental CT findings: none CHEST: No hypermetabolic mediastinal or hilar nodes. No suspicious pulmonary nodules on the CT scan. Incidental CT findings: none ABDOMEN/PELVIS: Within the mid body the pancreas, ovoid mass measures 2.7 by 2.0 cm (image 153) with SUV max equal 7.1. No additional lesions in the pancreas. No abnormal metabolic activity  in the liver. No hypermetabolic periportal peripancreatic lymph nodes. No periaortic retroperitoneal hypermetabolic lymph nodes or pelvic lymph nodes. Incidental CT findings: Small RIGHT renal artery aneurysm measuring 13 mm (image 160/CT series 3). SKELETON: No focal hypermetabolic activity to suggest skeletal metastasis. Incidental CT findings: none IMPRESSION: 1. Hypermetabolic mass in the pancreas body consistent primary pancreatic neoplasm. 2. No evidence of metastatic adenopathy in the abdomen pelvis. 3. No evidence of liver metastasis. 4. Hypermetabolic nodule in the isthmus of the thyroid gland. Recommend thyroid ultrasound to exclude thyroid carcinoma. Electronically Signed   By: Suzy Bouchard M.D.   On: 09/30/2020 14:10     Assessment and plan- Patient is a 87 y.o. 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  He is receiving gemcitabine at a reduced dose of 800 mg per metered square along with Abraxane 100 mg per metered squared due to his age and concerns for toxicity.  He will directly proceed for cycle 1 day 8 of gemcitabine and Abraxane next week and I will see him back in 2 weeks for cycle 1 day 15.  Discussed risks and effects of chemotherapy including all but not limited to nausea, vomiting, low blood counts, risk of infections and hospitalization.  Risk of peripheral neuropathy associated with Abraxane.  Treatment will be given with a curative intent.  Patient understands and agrees to proceed as planned   Visit Diagnosis 1. Malignant neoplasm of body of pancreas ([place])   2. Encounter for antineoplastic chemotherapy      Dr. Randa Evens, MD, MPH [facility] [phone] 10/08/2020 2:35 PM

## 2020-10-09 ENCOUNTER — Other Ambulatory Visit
Admission: RE | Admit: 2020-10-09 | Discharge: 2020-10-09 | Disposition: A | Discharge status: Home / Self Care | Payer: PPO | Source: Ambulatory Visit | Attending: Vascular Surgery | Admitting: Vascular Surgery

## 2020-10-09 ENCOUNTER — Telehealth: Payer: Self-pay

## 2020-10-09 ENCOUNTER — Encounter (INDEPENDENT_AMBULATORY_CARE_PROVIDER_SITE_OTHER): Payer: Self-pay

## 2020-10-09 ENCOUNTER — Ambulatory Visit
Admission: RE | Admit: 2020-10-09 | Discharge: 2020-10-09 | Disposition: A | Discharge status: Home / Self Care | Payer: PPO | Source: Ambulatory Visit | Attending: Internal Medicine | Admitting: Internal Medicine

## 2020-10-09 ENCOUNTER — Ambulatory Visit: Payer: PPO | Admitting: Internal Medicine

## 2020-10-09 ENCOUNTER — Telehealth (INDEPENDENT_AMBULATORY_CARE_PROVIDER_SITE_OTHER): Payer: Self-pay | Admitting: Vascular Surgery

## 2020-10-09 DIAGNOSIS — I712 Thoracic aortic aneurysm, without rupture: Secondary | ICD-10-CM | POA: Insufficient documentation

## 2020-10-09 DIAGNOSIS — K7460 Unspecified cirrhosis of liver: Secondary | ICD-10-CM | POA: Diagnosis not present

## 2020-10-09 DIAGNOSIS — Z20822 Contact with and (suspected) exposure to covid-19: Secondary | ICD-10-CM | POA: Diagnosis not present

## 2020-10-09 DIAGNOSIS — I5189 Other ill-defined heart diseases: Secondary | ICD-10-CM

## 2020-10-09 DIAGNOSIS — R0602 Shortness of breath: Secondary | ICD-10-CM | POA: Diagnosis not present

## 2020-10-09 DIAGNOSIS — E785 Hyperlipidemia, unspecified: Secondary | ICD-10-CM | POA: Diagnosis not present

## 2020-10-09 DIAGNOSIS — I7121 Aneurysm of the ascending aorta, without rupture: Secondary | ICD-10-CM

## 2020-10-09 HISTORY — DX: Other ill-defined heart diseases: I51.89

## 2020-10-09 LAB — ECHOCARDIOGRAM COMPLETE
AR max vel: 2.4 cm2
AV Area VTI: 2.55 cm2
AV Area mean vel: 2.39 cm2
AV Mean grad: 5 mmHg
AV Peak grad: 9.1 mmHg
Ao pk vel: 1.51 m/s
Area-P 1/2: 2.76 cm2
S' Lateral: 2.22 cm

## 2020-10-09 LAB — SARS CORONAVIRUS 2 (TAT 6-24 HRS): SARS Coronavirus 2: NEGATIVE

## 2020-10-09 LAB — HEPATITIS B SURFACE ANTIBODY, QUANTITATIVE: Hep B S AB Quant (Post): 45 m[IU]/mL (ref >9.9)

## 2020-10-09 NOTE — Telephone Encounter (Signed)
Patient left vm requesting information on his procedure he will be getting done on the 25th with Dr. Lucky Cowboy.  He needs to know when he needs to arrive, where to go etc.  Please advise.

## 2020-10-09 NOTE — Telephone Encounter (Signed)
Patient has giving pre-procedure instructions and will be coming by the office to pick up a copy.

## 2020-10-09 NOTE — Telephone Encounter (Signed)
Telephone call to patient for follow up after receiving first infusion.   No answer but left message stating we were calling to check on them.  Encouraged patient to call for any questions or concerns.   

## 2020-10-09 NOTE — Progress Notes (Signed)
*  PRELIMINARY RESULTS* Echocardiogram 2D Echocardiogram has been performed.  Sherrie Sport 10/09/2020, 11:35 AM

## 2020-10-09 NOTE — Progress Notes (Signed)
Patient called today with questions regarding procedure scheduled Monday 10/12/2020.  Provided patient with phone number for [facility] [phone] and recommended he call them for procedure questions.  I did confirm with him to be here at [facility] at registration by 11am Monday morning.  Patient states he knows about his echocardiogram scheduled today and will call [facility] today.

## 2020-10-10 ENCOUNTER — Other Ambulatory Visit: Payer: Self-pay | Admitting: Oncology

## 2020-10-12 ENCOUNTER — Ambulatory Visit: Admission: RE | Admit: 2020-10-12 | Payer: PPO | Source: Home / Self Care | Admitting: Vascular Surgery

## 2020-10-12 ENCOUNTER — Ambulatory Visit: Payer: PPO | Admitting: Internal Medicine

## 2020-10-12 ENCOUNTER — Encounter: Payer: Self-pay | Admitting: Gastroenterology

## 2020-10-12 ENCOUNTER — Telehealth: Payer: Self-pay | Admitting: *Deleted

## 2020-10-12 ENCOUNTER — Telehealth (INDEPENDENT_AMBULATORY_CARE_PROVIDER_SITE_OTHER): Payer: Self-pay

## 2020-10-12 ENCOUNTER — Encounter: Admission: RE | Payer: Self-pay | Source: Home / Self Care

## 2020-10-12 ENCOUNTER — Other Ambulatory Visit (INDEPENDENT_AMBULATORY_CARE_PROVIDER_SITE_OTHER): Payer: Self-pay | Admitting: Nurse Practitioner

## 2020-10-12 ENCOUNTER — Telehealth: Payer: Self-pay

## 2020-10-12 DIAGNOSIS — C259 Malignant neoplasm of pancreas, unspecified: Secondary | ICD-10-CM

## 2020-10-12 LAB — PANCREATIC ELASTASE, FECAL: Pancreatic Elastase, Fecal: 102 ug Elast./g — ABNORMAL LOW (ref >200)

## 2020-10-12 SURGERY — PORTA CATH INSERTION
Anesthesia: Moderate Sedation

## 2020-10-12 MED ORDER — SODIUM CHLORIDE 0.9 % IV SOLN
Freq: Once | INTRAVENOUS | Status: DC
  Filled 2020-10-12: qty 2

## 2020-10-12 MED ORDER — PANCRELIPASE (LIP-PROT-AMYL) 36000-114000 UNITS PO CPEP: ORAL_CAPSULE | ORAL | 2 refills | Status: DC

## 2020-10-12 NOTE — Telephone Encounter (Signed)
Call returned to patient and he requested that we call to cancel his port placement for today. I called [facility] and had to leave message on Laura's voice mail to cancel patient port placement today

## 2020-10-12 NOTE — Telephone Encounter (Signed)
Patient called stating he is not sure that he wants to proceed with the port placement today citing that he does not think he is going to be able to complete chemotherapy. He reports that due to his pancreas, he is unable to digest foods and he had a horrible weekend trying to find something he could eat without feeling like he was going to have a heart attack. He said the GI doctor needs to give him some sort of hope that he is going to get better and is awaiting a call from Dr Marius Ditch. He would like to hear from Dr Janese Banks as well as to why he should get the port placed today. He said he doesn't want to get if it is not going to be of any use. Please return his call to his cell phone [phone]

## 2020-10-12 NOTE — Telephone Encounter (Addendum)
Patient verbalized understanding  

## 2020-10-12 NOTE — Telephone Encounter (Signed)
Patient is having a procedure done today to place a porta. He states he started using creon before his first chemo treatment and it was working great. He states he was not having any alter bowel habits or bloating. He states after his first chemo treatment his symptoms return. Patient does not feel like a port being placed and starting routine chemo treatments is going to help him because he is not able to digest foods. Informed patient Dr. Janese Banks would want him to still have the port placed and have the chemo treatment. Informed patient he needed to contact her office also. Patient wants to know what you recommend since the chemo is not working

## 2020-10-12 NOTE — Telephone Encounter (Signed)
Called and left a message for call back. Sent new RX to the pharmacy

## 2020-10-12 NOTE — Telephone Encounter (Signed)
Per Dr Janese Banks ok to hold off on port I will get in touch with dr Marius Ditch we can keep chemo plans the way they are for now and he can always get it without port if need be

## 2020-10-12 NOTE — Telephone Encounter (Signed)
Patient called to cancel port placement scheduled for 10/12/20 today. Patient stated he has health issues that have come up and will call to reschedule.

## 2020-10-12 NOTE — Addendum Note (Signed)
Addended by: Ulyess Blossom L on: 10/12/2020 12:36 PM   Modules accepted: Orders

## 2020-10-12 NOTE — Telephone Encounter (Signed)
Sometimes he can have diarrhea from side effects of chemo itself.  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  We can increase Creon to 2 capsules with each meal and 1 capsule with snack  RV

## 2020-10-12 NOTE — Telephone Encounter (Signed)
Called pt and he said he had a bad weekend. He states he was doing well until after he ate sandwich and soup at cancer center.digestion issues, bloating. He had stools. He did the test on it and it was at sever point and called GI and was advised to increase creon with each meal and each snack. He Hopes that the creon to help and that is why he is not getting port. If he cont. To feel bad then he may stop with everthing. He will come this week and we will see how he is and go from there about his treatment this week. He is agreeable to this.

## 2020-10-13 ENCOUNTER — Telehealth: Payer: Self-pay

## 2020-10-13 NOTE — Telephone Encounter (Signed)
Received call from George Owens. He is interested in getting his vascular port inserted if he tolerates his chemotherapy better on 4/28. He would like to try and get it before his next treatment on 5/5. I will follow up with him 4/28 and 4/29 and arrange port if he tolerates treatment.

## 2020-10-14 ENCOUNTER — Telehealth: Payer: Self-pay

## 2020-10-15 ENCOUNTER — Other Ambulatory Visit
Admission: RE | Admit: 2020-10-15 | Discharge: 2020-10-15 | Disposition: A | Discharge status: Home / Self Care | Payer: PPO | Source: Ambulatory Visit | Attending: Vascular Surgery | Admitting: Vascular Surgery

## 2020-10-15 ENCOUNTER — Other Ambulatory Visit: Payer: Self-pay

## 2020-10-15 ENCOUNTER — Inpatient Hospital Stay: Payer: PPO

## 2020-10-15 ENCOUNTER — Telehealth: Payer: Self-pay | Admitting: *Deleted

## 2020-10-15 DIAGNOSIS — Z01812 Encounter for preprocedural laboratory examination: Secondary | ICD-10-CM | POA: Insufficient documentation

## 2020-10-15 DIAGNOSIS — Z20822 Contact with and (suspected) exposure to covid-19: Secondary | ICD-10-CM | POA: Diagnosis not present

## 2020-10-15 DIAGNOSIS — Z5111 Encounter for antineoplastic chemotherapy: Secondary | ICD-10-CM | POA: Diagnosis not present

## 2020-10-15 DIAGNOSIS — C251 Malignant neoplasm of body of pancreas: Secondary | ICD-10-CM

## 2020-10-15 LAB — CBC WITH DIFFERENTIAL/PLATELET
Abs Immature Granulocytes: 0.01 10*3/uL (ref 0.00–0.07)
Basophils Absolute: 0 10*3/uL (ref 0.0–0.1)
Basophils Relative: 1 %
Eosinophils Absolute: 0 10*3/uL (ref 0.0–0.5)
Eosinophils Relative: 2 %
HCT: 37.3 % — ABNORMAL LOW (ref 39.0–52.0)
Hemoglobin: 12.7 g/dL — ABNORMAL LOW (ref 13.0–17.0)
Immature Granulocytes: 1 %
Lymphocytes Relative: 43 %
Lymphs Abs: 0.8 10*3/uL (ref 0.7–4.0)
MCH: 30.6 pg (ref 26.0–34.0)
MCHC: 34 g/dL (ref 30.0–36.0)
MCV: 89.9 fL (ref 80.0–100.0)
Monocytes Absolute: 0.1 10*3/uL (ref 0.1–1.0)
Monocytes Relative: 8 %
Neutro Abs: 0.8 10*3/uL — ABNORMAL LOW (ref 1.7–7.7)
Neutrophils Relative %: 45 %
Platelets: 101 10*3/uL — ABNORMAL LOW (ref 150–400)
RBC: 4.15 MIL/uL — ABNORMAL LOW (ref 4.22–5.81)
RDW: 12.4 % (ref 11.5–15.5)
WBC: 1.8 10*3/uL — ABNORMAL LOW (ref 4.0–10.5)
nRBC: 0 % (ref 0.0–0.2)

## 2020-10-15 LAB — COMPREHENSIVE METABOLIC PANEL
ALT: 51 U/L — ABNORMAL HIGH (ref 0–44)
AST: 41 U/L (ref 15–41)
Albumin: 3.8 g/dL (ref 3.5–5.0)
Alkaline Phosphatase: 40 U/L (ref 38–126)
Anion gap: 10 (ref 5–15)
BUN: 24 mg/dL — ABNORMAL HIGH (ref 8–23)
CO2: 29 mmol/L (ref 22–32)
Calcium: 9.1 mg/dL (ref 8.9–10.3)
Chloride: 101 mmol/L (ref 98–111)
Creatinine, Ser: 1 mg/dL (ref 0.61–1.24)
GFR, Estimated: >60 mL/min (ref >60)
Glucose, Bld: 123 mg/dL — ABNORMAL HIGH (ref 70–99)
Potassium: 4.1 mmol/L (ref 3.5–5.1)
Sodium: 140 mmol/L (ref 135–145)
Total Bilirubin: 0.7 mg/dL (ref 0.3–1.2)
Total Protein: 6.6 g/dL (ref 6.5–8.1)

## 2020-10-15 LAB — SARS CORONAVIRUS 2 (TAT 6-24 HRS): SARS Coronavirus 2: NEGATIVE

## 2020-10-15 NOTE — Telephone Encounter (Signed)
Pt will not get infusion today due to anc 0.8. I have went over precautions for pt. tye to stay out of public. Wear mask. wash his hands and do not allow anyone to come into house that is sick or running fever. Also told pt that if he has fever 100.4 or higher to call us. Pt would like to see if we can increase creon 1 extra capsule  The day before chemo and the day of chemo. I will speak to dr Marius Ditch. And let pt know

## 2020-10-15 NOTE — Progress Notes (Unsigned)
ANC 0.8 today. Per Dr Janese Banks no chemo today.

## 2020-10-15 NOTE — Telephone Encounter (Signed)
Spoke with George Owens regarding port placement. Went over instructions below in great detail. All questions answered.  Port scheduled Monday, 10/19/2020, 0900 [facility]. Take medications with a small sip of water that morning. Do not eat or drink anything after midnight. Will need a driver and can have one person with him. Covid testing 10/15/20, between 0800 and 1400. He will go after his chemotherapy treatment.

## 2020-10-16 ENCOUNTER — Encounter: Payer: Self-pay | Admitting: Internal Medicine

## 2020-10-16 ENCOUNTER — Ambulatory Visit (INDEPENDENT_AMBULATORY_CARE_PROVIDER_SITE_OTHER): Payer: PPO | Admitting: Internal Medicine

## 2020-10-16 ENCOUNTER — Other Ambulatory Visit: Payer: Self-pay

## 2020-10-16 DIAGNOSIS — F418 Other specified anxiety disorders: Secondary | ICD-10-CM

## 2020-10-16 DIAGNOSIS — I712 Thoracic aortic aneurysm, without rupture: Secondary | ICD-10-CM | POA: Diagnosis not present

## 2020-10-16 DIAGNOSIS — K7460 Unspecified cirrhosis of liver: Secondary | ICD-10-CM

## 2020-10-16 DIAGNOSIS — I7121 Aneurysm of the ascending aorta, without rupture: Secondary | ICD-10-CM

## 2020-10-16 MED ORDER — ALPRAZOLAM 0.25 MG PO TABS: 0.1250 mg | ORAL_TABLET | Freq: Every evening | ORAL | 0 refills | Status: DC | PRN

## 2020-10-16 MED ORDER — FLUTICASONE PROPIONATE 50 MCG/ACT NA SUSP: 2.0000 | Freq: Every day | NASAL | 6 refills | Status: DC

## 2020-10-16 NOTE — Progress Notes (Signed)
Subjective:  Patient ID: George Owens, male    DOB: 09-20-1931  Age: 87 y.o. MRN: [id number]  CC: Diagnoses of Thoracic ascending aortic aneurysm ([place]), Cirrhosis of liver without ascites, unspecified hepatic cirrhosis type ([place]), and Anxiety about health were pertinent to this visit.  HPI George Owens presents for follow up on multiple chronic issues, including recent diagnosis of pancreatic ca.  He is accompanied by his wife George Owens.   This visit occurred during the SARS-CoV-2 public health emergency.  Safety protocols were in place, including screening questions prior to the visit, additional usage of staff PPE, and extensive cleaning of exam room while observing appropriate contact time as indicated for disinfecting solutions.   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  He has been Checking temp daily with an oral thermometer, but is concerned about the silver tip of his thermometer being a metal that is contraindicated per oncology .  Since I could not confirm the nature of the metal,  I recommended he obtain a headband or ear thermometer   We discussed each of the medical issues that became apparent in the last 6 months.   Aortic aneurysm, incidental finding og a hypermetabolic thyroid nodule on his PET scan,  Follow upon his non alcoholic  Cirrhosis  Insomnia/anxiety:  sleeping about 6 hours with trazodone .  Wakes up to void at 4:30 and can't go back to sleep . Discussed using 0.125 alprazolam.   New concerns today:  1)Before he started chemo,  He started feeling throat tenderness which he is attributing to sinus drainage.  Has been using salt water gargles which has alleviated his discomfort    2) persistent loss of balance since COVID infection .  Thinks that earwax contributes . No vertigo.  Still walking > 1 mile daily . Doesn't notice it when walking,  But finds he  trips over small stones,    Cracks in pavement.  Not interested in using a cane   3) Thoracic aneurysm .:  He was referred to vascular surgery and has a CT Angiogram scheduled by Fredrich Romans (thoracic surgeon in [place]) but is not sure if he still needs to have it .   Outpatient Medications Prior to Visit  Medication Sig Dispense Refill  . acetaminophen (TYLENOL) 325 MG tablet Take 650 mg by mouth every 6 (six) hours as needed.    . Calcium Carb-Cholecalciferol (CALCIUM 1000 + D PO) Take 1 tablet by mouth daily.     . Cholecalciferol 50 MCG (2000 UT) CAPS Take by mouth.    . Denosumab (PROLIA [place]) Inject into the skin every 6 (six) months.    [name] glucosamine-chondroitin 500-400 MG tablet Take 1 tablet by mouth daily.    [name] lidocaine-prilocaine (EMLA) cream Apply to affected area once 30 g 3  . lipase/protease/amylase (CREON) 36000 UNITS CPEP capsule Take 2 capsule with the first bite of each meal and 1 capsule with the first bite of each snack 240 capsule 2  . loratadine (CLARITIN) 10 MG tablet Take 10 mg by mouth daily as needed.     [name] LORazepam (ATIVAN) 0.5 MG tablet Take 1 tablet (0.5 mg total) by mouth every 6 (six) hours as needed (Nausea or vomiting). 30 tablet 0  . pantoprazole (PROTONIX) 40 MG tablet Take 1 tablet (40 mg total) by mouth 2 (two) times daily. (Patient taking differently: Take 40  mg by mouth daily. Tapering off) 60 tablet 0  . pramoxine-hydrocortisone (PROCTOCREAM-HC) 1-1 % rectal cream PLACE 1 APPLICATION RECTALLY 2 (TWO) TIMES DAILY. 30 g 0  . prochlorperazine (COMPAZINE) 5 MG tablet Take 5 mg by mouth every 6 (six) hours as needed for nausea or vomiting.    . Ranibizumab (LUCENTIS IO) Inject into the eye as needed. As needed every three months.    . traZODone (DESYREL) 50 MG tablet TAKE 0.5 TABLETS (25 MG TOTAL) BY MOUTH AT BEDTIME. MAY INCREASE TO FULL TABLET IF NEEDED 90 tablet 0  . vitamin B-12 (CYANOCOBALAMIN) 1000 MCG tablet Take 1,000 mcg by mouth in the morning and at  bedtime.    . naproxen sodium (ALEVE) 220 MG tablet Take by mouth. (Patient not taking: Reported on 10/16/2020)     No facility-administered medications prior to visit.    Review of Systems;  Patient denies headache, fevers, malaise, unintentional weight loss, skin rash, eye pain, sinus congestion and sinus pain, sore throat, dysphagia,  hemoptysis , cough, dyspnea, wheezing, chest pain, palpitations, orthopnea, edema, abdominal pain, nausea, melena, diarrhea, constipation, flank pain, dysuria, hematuria, urinary  Frequency, nocturia, numbness, tingling, seizures,  Focal weakness, Loss of consciousness,  Tremor, insomnia, depression, anxiety, and suicidal ideation.      Objective:  BP 112/60 (BP Location: Left Arm, Patient Position: Sitting, Cuff Size: Normal)   Pulse (!) 50   Temp (!) 96.7 F (35.9 C) (Oral)   Resp 15   Ht 6\' 4"  (1.93 m)   Wt 187 lb 3.2 oz (84.9 kg)   SpO2 99%   BMI 22.79 kg/m   BP Readings from Last 3 Encounters:  10/16/20 112/60  10/08/20 119/63  10/08/20 123/70    Wt Readings from Last 3 Encounters:  10/16/20 187 lb 3.2 oz (84.9 kg)  10/08/20 189 lb (85.7 kg)  10/06/20 189 lb 4 oz (85.8 kg)    General appearance: alert, cooperative and appears stated age Ears: normal TM's and external ear canals both ears Throat: lips, mucosa, and tongue normal; teeth and gums normal Neck: no adenopathy, no carotid bruit, supple, symmetrical, trachea midline and thyroid not enlarged, symmetric, no tenderness/mass/nodules Back: symmetric, no curvature. ROM normal. No CVA tenderness. Lungs: clear to auscultation bilaterally Heart: regular rate and rhythm, S1, S2 normal, no murmur, click, rub or gallop Abdomen: soft, non-tender; bowel sounds normal; no masses,  no organomegaly Pulses: 2+ and symmetric Skin: Skin color, texture, turgor normal. No rashes or lesions Lymph nodes: Cervical, supraclavicular, and axillary nodes normal.  No results found for: HGBA1C  Lab  Results  Component Value Date   CREATININE 1.00 10/15/2020   CREATININE 0.97 10/08/2020   CREATININE 1.00 09/11/2020    Lab Results  Component Value Date   WBC 1.8 (L) 10/15/2020   HGB 12.7 (L) 10/15/2020   HCT 37.3 (L) 10/15/2020   PLT 101 (L) 10/15/2020   GLUCOSE 123 (H) 10/15/2020   CHOL 198 04/19/2019   TRIG 171 (H) 04/19/2019   HDL 41 04/19/2019   LDLDIRECT 129.0 03/31/2017   LDLCALC 128 (H) 04/19/2019   ALT 51 (H) 10/15/2020   AST 41 10/15/2020   NA 140 10/15/2020   K 4.1 10/15/2020   CL 101 10/15/2020   CREATININE 1.00 10/15/2020   BUN 24 (H) 10/15/2020   CO2 29 10/15/2020   TSH 2.26 04/27/2020   PSA 0.51 04/27/2020   INR 1.0 04/30/2020    No results found.  Assessment & Plan:   Problem List Items  Addressed This Visit      Unprioritized   Thoracic ascending aortic aneurysm ([place])    Measured at 4.7  Cm.  He is scheduled to have a CT angiogram in late May ,  Which appears to have been ordered by Dr Orvan Seen prior to his diagnosis of pancreatic cancer was known.  He should not need to go at this time.        Cirrhosis of liver ([facility])    Compensated.  Reassured that treatment for pancreatic CA takes priority over any treatment for cirrhosis       Anxiety about health    counselling given.  He is very disappointed about the neutropenia that resulted after his first round , but tolerated the infusion without nausea or diarrean.        Relevant Medications   ALPRAZolam (XANAX) 0.25 MG tablet     A total of 40 minutes was spent with patient more than half of which was spent in counseling patient on the above mentioned issues , reviewing and explaining recent labs and imaging studies done, and coordination of care.   I have discontinued Dann A. Schroyer's naproxen sodium. I am also having him start on ALPRAZolam and fluticasone. Additionally, I am having him maintain his loratadine, Calcium Carb-Cholecalciferol (CALCIUM 1000 + D PO), Denosumab (PROLIA [place]),  Ranibizumab (LUCENTIS IO), glucosamine-chondroitin, pramoxine-hydrocortisone, traZODone, vitamin B-12, pantoprazole, lidocaine-prilocaine, LORazepam, Cholecalciferol, prochlorperazine, lipase/protease/amylase, and acetaminophen.  Meds ordered this encounter  Medications  . ALPRAZolam (XANAX) 0.25 MG tablet    Sig: Take 0.5 tablets (0.125 mg total) by mouth at bedtime as needed for anxiety or sleep.    Dispense:  20 tablet    Refill:  0  . fluticasone (FLONASE) 50 MCG/ACT nasal spray    Sig: Place 2 sprays into both nostrils daily.    Dispense:  16 g    Refill:  6    Medications Discontinued During This Encounter  Medication Reason  . naproxen sodium (ALEVE) 220 MG tablet     Follow-up: No follow-ups on file.   Crecencio Mc, MD

## 2020-10-16 NOTE — Patient Instructions (Addendum)
Your ears and throat look fine!  Consider adding flonase for a trial  to see if the balance issues improve by treating sinus/Eustachian tube inflammation   Practice the exercises  I showed you for balance and proximal leg strength  Take the 1/2 tablet of alprazolam when you get up to go to the bathroom or earlier  If need be for your insomia     "Be anxious for nothing,  But in everything , by prayer and supplication , let your request by made known to God. And the peace of God which surpasses all understanding, shall guard your hearts and minds through [employer]."

## 2020-10-17 NOTE — Assessment & Plan Note (Signed)
Measured at 4.7  Cm.  He is scheduled to have a CT angiogram in late May ,  Which appears to have been ordered by Dr Orvan Seen prior to his diagnosis of pancreatic cancer was known.  He should not need to go at this time.

## 2020-10-17 NOTE — Assessment & Plan Note (Signed)
counselling given.  He is very disappointed about the neutropenia that resulted after his first round , but tolerated the infusion without nausea or diarrean.

## 2020-10-17 NOTE — Assessment & Plan Note (Addendum)
Compensated.  Reassured that treatment for pancreatic CA takes priority over any treatment for cirrhosis

## 2020-10-19 ENCOUNTER — Encounter
Admission: RE | Disposition: A | Discharge status: Home / Self Care | Payer: Self-pay | Source: Ambulatory Visit | Attending: Vascular Surgery

## 2020-10-19 ENCOUNTER — Ambulatory Visit
Admission: RE | Admit: 2020-10-19 | Discharge: 2020-10-19 | Disposition: A | Discharge status: Home / Self Care | Payer: PPO | Source: Ambulatory Visit | Attending: Vascular Surgery | Admitting: Vascular Surgery

## 2020-10-19 ENCOUNTER — Other Ambulatory Visit (INDEPENDENT_AMBULATORY_CARE_PROVIDER_SITE_OTHER): Payer: Self-pay | Admitting: Nurse Practitioner

## 2020-10-19 ENCOUNTER — Encounter: Payer: Self-pay | Admitting: Vascular Surgery

## 2020-10-19 DIAGNOSIS — C251 Malignant neoplasm of body of pancreas: Secondary | ICD-10-CM | POA: Diagnosis not present

## 2020-10-19 DIAGNOSIS — Z85828 Personal history of other malignant neoplasm of skin: Secondary | ICD-10-CM | POA: Diagnosis not present

## 2020-10-19 DIAGNOSIS — Z8546 Personal history of malignant neoplasm of prostate: Secondary | ICD-10-CM | POA: Insufficient documentation

## 2020-10-19 DIAGNOSIS — Z8249 Family history of ischemic heart disease and other diseases of the circulatory system: Secondary | ICD-10-CM | POA: Diagnosis not present

## 2020-10-19 DIAGNOSIS — Z8616 Personal history of COVID-19: Secondary | ICD-10-CM | POA: Insufficient documentation

## 2020-10-19 DIAGNOSIS — C259 Malignant neoplasm of pancreas, unspecified: Secondary | ICD-10-CM | POA: Diagnosis not present

## 2020-10-19 DIAGNOSIS — E785 Hyperlipidemia, unspecified: Secondary | ICD-10-CM | POA: Insufficient documentation

## 2020-10-19 DIAGNOSIS — Z79899 Other long term (current) drug therapy: Secondary | ICD-10-CM | POA: Diagnosis not present

## 2020-10-19 DIAGNOSIS — I722 Aneurysm of renal artery: Secondary | ICD-10-CM | POA: Diagnosis not present

## 2020-10-19 DIAGNOSIS — Z888 Allergy status to other drugs, medicaments and biological substances status: Secondary | ICD-10-CM | POA: Diagnosis not present

## 2020-10-19 DIAGNOSIS — Z808 Family history of malignant neoplasm of other organs or systems: Secondary | ICD-10-CM | POA: Diagnosis not present

## 2020-10-19 DIAGNOSIS — Z8619 Personal history of other infectious and parasitic diseases: Secondary | ICD-10-CM | POA: Insufficient documentation

## 2020-10-19 HISTORY — PX: PORTA CATH INSERTION: CATH118285

## 2020-10-19 HISTORY — DX: Abdominal aortic aneurysm, without rupture, unspecified: I71.40

## 2020-10-19 HISTORY — DX: Abdominal aortic aneurysm, without rupture: I71.4

## 2020-10-19 SURGERY — PORTA CATH INSERTION
Anesthesia: Moderate Sedation

## 2020-10-19 MED ORDER — CEFAZOLIN SODIUM-DEXTROSE 2-4 GM/100ML-% IV SOLN
2.0000 g | Freq: Once | INTRAVENOUS | Status: AC
  Administered 2020-10-19: 2 g via INTRAVENOUS

## 2020-10-19 MED ORDER — DIPHENHYDRAMINE HCL 50 MG/ML IJ SOLN: 50.0000 mg | Freq: Once | INTRAMUSCULAR | Status: DC | PRN

## 2020-10-19 MED ORDER — ONDANSETRON HCL 4 MG/2ML IJ SOLN: 4.0000 mg | Freq: Four times a day (QID) | INTRAMUSCULAR | Status: DC | PRN

## 2020-10-19 MED ORDER — MIDAZOLAM HCL 2 MG/2ML IJ SOLN
INTRAMUSCULAR | Status: DC | PRN
  Administered 2020-10-19: 1 mg via INTRAVENOUS

## 2020-10-19 MED ORDER — SODIUM CHLORIDE 0.9 % IV SOLN
Freq: Once | INTRAVENOUS | Status: DC
  Filled 2020-10-19: qty 2

## 2020-10-19 MED ORDER — FENTANYL CITRATE (PF) 100 MCG/2ML IJ SOLN
INTRAMUSCULAR | Status: DC | PRN
  Administered 2020-10-19: 25 ug via INTRAVENOUS

## 2020-10-19 MED ORDER — SODIUM CHLORIDE 0.9 % IV SOLN
INTRAVENOUS | Status: DC
  Administered 2020-10-19: 1000 mL via INTRAVENOUS

## 2020-10-19 MED ORDER — FENTANYL CITRATE (PF) 100 MCG/2ML IJ SOLN
INTRAMUSCULAR | Status: AC
  Filled 2020-10-19: qty 2

## 2020-10-19 MED ORDER — FAMOTIDINE 20 MG PO TABS: 40.0000 mg | ORAL_TABLET | Freq: Once | ORAL | Status: DC | PRN

## 2020-10-19 MED ORDER — METHYLPREDNISOLONE SODIUM SUCC 125 MG IJ SOLR: 125.0000 mg | Freq: Once | INTRAMUSCULAR | Status: DC | PRN

## 2020-10-19 MED ORDER — HYDROMORPHONE HCL 1 MG/ML IJ SOLN: 1.0000 mg | Freq: Once | INTRAMUSCULAR | Status: DC | PRN

## 2020-10-19 MED ORDER — MIDAZOLAM HCL 2 MG/2ML IJ SOLN
INTRAMUSCULAR | Status: AC
  Filled 2020-10-19: qty 2

## 2020-10-19 MED ORDER — CEFAZOLIN SODIUM-DEXTROSE 2-4 GM/100ML-% IV SOLN
INTRAVENOUS | Status: AC
  Filled 2020-10-19: qty 100

## 2020-10-19 MED ORDER — CHLORHEXIDINE GLUCONATE CLOTH 2 % EX PADS
6.0000 | MEDICATED_PAD | Freq: Every day | CUTANEOUS | Status: DC
  Administered 2020-10-19: 6 via TOPICAL

## 2020-10-19 MED ORDER — MIDAZOLAM HCL 2 MG/ML PO SYRP: 8.0000 mg | ORAL_SOLUTION | Freq: Once | ORAL | Status: DC | PRN

## 2020-10-19 SURGICAL SUPPLY — 16 items
ADH SKN CLS APL DERMABOND .7 (GAUZE/BANDAGES/DRESSINGS) ×1
COVER PROBE U/S 5X48 (MISCELLANEOUS) ×2 IMPLANT
COVER SURGICAL LIGHT HANDLE (MISCELLANEOUS) ×2 IMPLANT
DERMABOND ADVANCED (GAUZE/BANDAGES/DRESSINGS) ×1
DERMABOND ADVANCED .7 DNX12 (GAUZE/BANDAGES/DRESSINGS) ×1 IMPLANT
ELECT REM PT RETURN 9FT ADLT (ELECTROSURGICAL) ×2
ELECTRODE REM PT RTRN 9FT ADLT (ELECTROSURGICAL) ×1 IMPLANT
HANDLE YANKAUER SUCT BULB TIP (MISCELLANEOUS) ×2 IMPLANT
KIT PORT POWER 8FR ISP CVUE (Port) ×2 IMPLANT
PACK ANGIOGRAPHY (CUSTOM PROCEDURE TRAY) ×2 IMPLANT
PENCIL ELECTRO HAND CTR (MISCELLANEOUS) ×2 IMPLANT
SPONGE XRAY 4X4 16PLY STRL (MISCELLANEOUS) ×2 IMPLANT
SUT MNCRL AB 4-0 PS2 18 (SUTURE) ×2 IMPLANT
SUT VIC AB 3-0 SH 27 (SUTURE) ×2
SUT VIC AB 3-0 SH 27X BRD (SUTURE) ×1 IMPLANT
TUBING CONNECTING 10 (TUBING) ×4 IMPLANT

## 2020-10-19 NOTE — Op Note (Signed)
      [facility]       Operative Note  Date: 10/19/2020  Preoperative diagnosis:  1. Pancreas cancer  Postoperative diagnosis:  Same as above  Procedures: #1. Ultrasound guidance for vascular access to the right internal jugular vein. #2. Fluoroscopic guidance for placement of catheter. #3. Placement of CT compatible Port-A-Cath, right internal jugular vein.  Surgeon: Leotis Pain, MD.   Anesthesia: Local with moderate conscious sedation for approximately 23  minutes using 1 mg of Versed and 25 mcg of Fentanyl  Fluoroscopy time: less than 1 minute  Contrast used: 0  Estimated blood loss: 5 cc  Indication for the procedure:  The patient is a 87 y.o.male with pancreas cancer.  The patient needs a Port-A-Cath for durable venous access, chemotherapy, lab draws, and CT scans. We are asked to place this. Risks and benefits were discussed and informed consent was obtained.  Description of procedure: The patient was brought to the vascular and interventional radiology suite.  Moderate conscious sedation was administered throughout the procedure during a face to face encounter with the patient with my supervision of the RN administering medicines and monitoring the patient's vital signs, pulse oximetry, telemetry and mental status throughout from the start of the procedure until the patient was taken to the recovery room. The right neck chest and shoulder were sterilely prepped and draped, and a sterile surgical field was created. Ultrasound was used to help visualize a patent right internal jugular vein. This was then accessed under direct ultrasound guidance without difficulty with the Seldinger needle and a permanent image was recorded. A J-wire was placed. After skin nick and dilatation, the peel-away sheath was then placed over the wire. I then anesthetized an area under the clavicle approximately 1-2 fingerbreadths. A transverse incision was created and an inferior pocket  was created with electrocautery and blunt dissection. The port was then brought onto the field, placed into the pocket and secured to the chest wall with 2 Prolene sutures. The catheter was connected to the port and tunneled from the subclavicular incision to the access site. Fluoroscopic guidance was then used to cut the catheter to an appropriate length. The catheter was then placed through the peel-away sheath and the peel-away sheath was removed. The catheter tip was parked in excellent location under fluorocoscopic guidance in the cavoatrial junction. The pocket was then irrigated with antibiotic impregnated saline and the wound was closed with a running 3-0 Vicryl and a 4-0 Monocryl. The access incision was closed with a single 4-0 Monocryl. The Huber needle was used to withdraw blood and flush the port with heparinized saline. Dermabond was then placed as a dressing. The patient tolerated the procedure well and was taken to the recovery room in stable condition.   Leotis Pain 10/19/2020 10:35 AM   This note was created with Dragon Medical transcription system. Any errors in dictation are purely unintentional.

## 2020-10-19 NOTE — Interval H&P Note (Signed)
History and Physical Interval Note:  10/19/2020 9:29 AM  George Owens  has presented today for surgery, with the diagnosis of Port Placement   Pancreatic adenocarcinoma.  The various methods of treatment have been discussed with the patient and family. After consideration of risks, benefits and other options for treatment, the patient has consented to  Procedure(s): PORTA CATH INSERTION (N/A) as a surgical intervention.  The patient's history has been reviewed, patient examined, no change in status, stable for surgery.  I have reviewed the patient's chart and labs.  Questions were answered to the patient's satisfaction.     Leotis Pain

## 2020-10-21 DIAGNOSIS — H43813 Vitreous degeneration, bilateral: Secondary | ICD-10-CM | POA: Diagnosis not present

## 2020-10-21 DIAGNOSIS — H353223 Exudative age-related macular degeneration, left eye, with inactive scar: Secondary | ICD-10-CM | POA: Diagnosis not present

## 2020-10-21 DIAGNOSIS — H353211 Exudative age-related macular degeneration, right eye, with active choroidal neovascularization: Secondary | ICD-10-CM | POA: Diagnosis not present

## 2020-10-21 DIAGNOSIS — H35433 Paving stone degeneration of retina, bilateral: Secondary | ICD-10-CM | POA: Diagnosis not present

## 2020-10-22 ENCOUNTER — Encounter: Payer: Self-pay | Admitting: Oncology

## 2020-10-22 ENCOUNTER — Inpatient Hospital Stay (HOSPITAL_BASED_OUTPATIENT_CLINIC_OR_DEPARTMENT_OTHER): Payer: PPO | Admitting: Oncology

## 2020-10-22 ENCOUNTER — Telehealth: Payer: Self-pay | Admitting: Internal Medicine

## 2020-10-22 ENCOUNTER — Inpatient Hospital Stay: Payer: PPO | Attending: Oncology

## 2020-10-22 ENCOUNTER — Inpatient Hospital Stay: Payer: PPO

## 2020-10-22 VITALS — BP 103/59 | HR 80 | Temp 96.9°F | Resp 18 | Wt 186.0 lb

## 2020-10-22 DIAGNOSIS — D701 Agranulocytosis secondary to cancer chemotherapy: Secondary | ICD-10-CM | POA: Diagnosis not present

## 2020-10-22 DIAGNOSIS — C251 Malignant neoplasm of body of pancreas: Secondary | ICD-10-CM

## 2020-10-22 DIAGNOSIS — E041 Nontoxic single thyroid nodule: Secondary | ICD-10-CM | POA: Diagnosis not present

## 2020-10-22 DIAGNOSIS — D6959 Other secondary thrombocytopenia: Secondary | ICD-10-CM | POA: Diagnosis not present

## 2020-10-22 DIAGNOSIS — E785 Hyperlipidemia, unspecified: Secondary | ICD-10-CM | POA: Diagnosis not present

## 2020-10-22 DIAGNOSIS — I722 Aneurysm of renal artery: Secondary | ICD-10-CM | POA: Insufficient documentation

## 2020-10-22 DIAGNOSIS — T451X5A Adverse effect of antineoplastic and immunosuppressive drugs, initial encounter: Secondary | ICD-10-CM | POA: Diagnosis not present

## 2020-10-22 DIAGNOSIS — Z5111 Encounter for antineoplastic chemotherapy: Secondary | ICD-10-CM | POA: Diagnosis not present

## 2020-10-22 DIAGNOSIS — K219 Gastro-esophageal reflux disease without esophagitis: Secondary | ICD-10-CM | POA: Insufficient documentation

## 2020-10-22 DIAGNOSIS — K7460 Unspecified cirrhosis of liver: Secondary | ICD-10-CM | POA: Insufficient documentation

## 2020-10-22 LAB — CBC WITH DIFFERENTIAL/PLATELET
Abs Immature Granulocytes: 0.01 10*3/uL (ref 0.00–0.07)
Basophils Absolute: 0 10*3/uL (ref 0.0–0.1)
Basophils Relative: 0 %
Eosinophils Absolute: 0.1 10*3/uL (ref 0.0–0.5)
Eosinophils Relative: 2 %
HCT: 37 % — ABNORMAL LOW (ref 39.0–52.0)
Hemoglobin: 12.6 g/dL — ABNORMAL LOW (ref 13.0–17.0)
Immature Granulocytes: 0 %
Lymphocytes Relative: 34 %
Lymphs Abs: 1 10*3/uL (ref 0.7–4.0)
MCH: 30.7 pg (ref 26.0–34.0)
MCHC: 34.1 g/dL (ref 30.0–36.0)
MCV: 90.2 fL (ref 80.0–100.0)
Monocytes Absolute: 0.4 10*3/uL (ref 0.1–1.0)
Monocytes Relative: 13 %
Neutro Abs: 1.6 10*3/uL — ABNORMAL LOW (ref 1.7–7.7)
Neutrophils Relative %: 51 %
Platelets: 145 10*3/uL — ABNORMAL LOW (ref 150–400)
RBC: 4.1 MIL/uL — ABNORMAL LOW (ref 4.22–5.81)
RDW: 13.3 % (ref 11.5–15.5)
WBC: 3.1 10*3/uL — ABNORMAL LOW (ref 4.0–10.5)
nRBC: 0 % (ref 0.0–0.2)

## 2020-10-22 LAB — COMPREHENSIVE METABOLIC PANEL
ALT: 37 U/L (ref 0–44)
AST: 40 U/L (ref 15–41)
Albumin: 3.9 g/dL (ref 3.5–5.0)
Alkaline Phosphatase: 34 U/L — ABNORMAL LOW (ref 38–126)
Anion gap: 10 (ref 5–15)
BUN: 22 mg/dL (ref 8–23)
CO2: 27 mmol/L (ref 22–32)
Calcium: 9 mg/dL (ref 8.9–10.3)
Chloride: 104 mmol/L (ref 98–111)
Creatinine, Ser: 0.88 mg/dL (ref 0.61–1.24)
GFR, Estimated: >60 mL/min (ref >60)
Glucose, Bld: 133 mg/dL — ABNORMAL HIGH (ref 70–99)
Potassium: 3.7 mmol/L (ref 3.5–5.1)
Sodium: 141 mmol/L (ref 135–145)
Total Bilirubin: 0.7 mg/dL (ref 0.3–1.2)
Total Protein: 6.7 g/dL (ref 6.5–8.1)

## 2020-10-22 MED ORDER — PACLITAXEL PROTEIN-BOUND CHEMO INJECTION 100 MG
80.0000 mg/m2 | Freq: Once | INTRAVENOUS | Status: AC
  Administered 2020-10-22: 175 mg via INTRAVENOUS
  Filled 2020-10-22: qty 35

## 2020-10-22 MED ORDER — PROCHLORPERAZINE MALEATE 10 MG PO TABS
10.0000 mg | ORAL_TABLET | Freq: Once | ORAL | Status: AC
  Administered 2020-10-22: 10 mg via ORAL
  Filled 2020-10-22: qty 1

## 2020-10-22 MED ORDER — HEPARIN SOD (PORK) LOCK FLUSH 100 UNIT/ML IV SOLN
500.0000 [IU] | Freq: Once | INTRAVENOUS | Status: AC | PRN
  Administered 2020-10-22: 500 [IU]
  Filled 2020-10-22: qty 5

## 2020-10-22 MED ORDER — PEGFILGRASTIM 6 MG/0.6ML ~~LOC~~ PSKT
6.0000 mg | PREFILLED_SYRINGE | Freq: Once | SUBCUTANEOUS | Status: AC
  Administered 2020-10-22: 6 mg via SUBCUTANEOUS
  Filled 2020-10-22: qty 0.6

## 2020-10-22 MED ORDER — SODIUM CHLORIDE 0.9 % IV SOLN
700.0000 mg/m2 | Freq: Once | INTRAVENOUS | Status: AC
  Administered 2020-10-22: 1520 mg via INTRAVENOUS
  Filled 2020-10-22: qty 26.3

## 2020-10-22 MED ORDER — SODIUM CHLORIDE 0.9 % IV SOLN
Freq: Once | INTRAVENOUS | Status: AC
  Filled 2020-10-22: qty 250

## 2020-10-22 MED ORDER — SODIUM CHLORIDE 0.9% FLUSH
10.0000 mL | Freq: Once | INTRAVENOUS | Status: DC
  Filled 2020-10-22: qty 10

## 2020-10-22 NOTE — Telephone Encounter (Signed)
Called left detailed message per DPR, please cal the office to schedule Prolia injection on or after 10/25/20.

## 2020-10-22 NOTE — Progress Notes (Signed)
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 No extrapancreatic infiltrative density about the aorta or proximal [name]. No local regional adenopathy or distant metastatic disease. 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 This was followed by an EUS at [facility] which showed a hypoechoic mass measuring 1.8 x 1.6 cm in the pancreatic body. Endosonographic borders poorly defined. Upstream pancreatic duct dilatation. No abnormal appearing lymph nodes. Endosonographic imaging of the liver showed no lesion. Biopsy showed adenocarcinoma.  PET scan showed 2.7 x 2 cm ovoid mass in the body of the pancreas with an SUV of 7.1. 18 mm hypermetabolic nodule in the isthmus of the thyroid gland. No evidence of local regional adenopathy or distant metastatic disease.  Interval history-patient reports getting indigestion and abdominal bloating and discomfort that lasted for 2 to 3 days following chemotherapy.  He states that he felt better after increasing his Creon intake.  Otherwise tolerated chemotherapy well without any significant side effects.  ECOG PS- 1 Pain scale- 0   Review of systems- Review of Systems   Constitutional: Negative for chills, fever, malaise/fatigue and weight loss.  HENT: Negative for congestion, ear discharge and nosebleeds.   Eyes: Negative for blurred vision.  Respiratory: Negative for cough, hemoptysis, sputum production, shortness of breath and wheezing.   Cardiovascular: Negative for chest pain, palpitations, orthopnea and claudication.  Gastrointestinal: Negative for abdominal pain, blood in stool, constipation, diarrhea, heartburn, melena, nausea and vomiting.  Genitourinary: Negative for dysuria, flank pain, frequency, hematuria and urgency.  Musculoskeletal: Negative for back pain, joint pain and myalgias.  Skin: Negative for rash.  Neurological: Negative for dizziness, tingling, focal weakness, seizures, weakness and headaches.  Endo/Heme/Allergies: Does not bruise/bleed easily.  Psychiatric/Behavioral: Negative for depression and suicidal ideas. The patient does not have insomnia.        Allergies  Allergen Reactions  . Kenalog [Triamcinolone Acetonide]     Blindness X 3 Days     Past Medical History:  Diagnosis Date  . Allergy   . Aneurysm of abdominal aorta (HCC)   . Basal cell carcinoma    right temple Dr. Evorn Gong 12/2018  . Chicken pox   . Colon polyps   . COVID-19 virus infection 03/16/2020  . GERD (gastroesophageal reflux disease)   . history of prostate CA 2006  . Hyperlipidemia   . Leg cramps   . Neuropathy   . Osteoporosis      Past Surgical History:  Procedure Laterality Date  . CATARACT EXTRACTION Right   . CATARACT EXTRACTION W/PHACO Left 12/26/2017   Procedure: CATARACT EXTRACTION PHACO AND INTRAOCULAR LENS PLACEMENT ([place])  COMPLICATED LEFT;  Surgeon: Leandrew Koyanagi, MD;  Location: [place];  Service: Ophthalmology;  Laterality: Left;  [place]  . colonoscopy    . ESOPHAGOGASTRODUODENOSCOPY (  EGD) WITH PROPOFOL    . EYE MUSCLE SURGERY Left   . PORTA CATH INSERTION N/A 10/19/2020   Procedure: PORTA CATH  INSERTION;  Surgeon: Algernon Huxley, MD;  Location: [facility];  Service: Cardiovascular;  Laterality: N/A;  . TONSILLECTOMY AND ADENOIDECTOMY      Social History   Socioeconomic History  . Marital status: Married    Spouse name: Eloise  . Number of children: 4  . Years of education: Not on file  . Highest education level: Not on file  Occupational History  . Occupation: retired  Tobacco Use  . Smoking status: Never Smoker  . Smokeless tobacco: Never Used  Vaping Use  . Vaping Use: Never used  Substance and Sexual Activity  . Alcohol use: No  . Drug use: No  . Sexual activity: Yes    Partners: Female  Other Topics Concern  . Not on file  Social History Narrative   Lives with wife in a 5 story retirement home.  He lives on the first floor.  4 year college degree.  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     Social Determinants of Health   Financial Resource Strain: Low Risk   . Difficulty of Paying Living Expenses: Not hard at all  Food Insecurity: Not on file  Transportation Needs: No Transportation Needs  . Lack of Transportation (Medical): No  . Lack of Transportation (Non-Medical): No  Physical Activity: Not on file  Stress: No Stress Concern Present  . Feeling of Stress : Not at all  Social Connections: Unknown  . Frequency of Communication with Friends and Family: Not on file  . Frequency of Social Gatherings with Friends and Family: Not on file  . Attends Religious Services: Not on file  . Active Member of Clubs or Organizations: Not on file  . Attends Archivist Meetings: Not on file  . Marital Status: Married  Human resources officer Violence: Not on file    Family History  Problem Relation Age of Onset  . Stroke Mother   . Arthritis Father   . Heart disease Father   . Brain cancer Brother        1/2 brother (possibly related to mother NOT related to patient)  . Colon cancer Neg Hx   . Esophageal cancer Neg Hx   .  Pancreatic cancer Neg Hx   . Stomach cancer Neg Hx   . Liver disease Neg Hx      Current Outpatient Medications:  .  acetaminophen (TYLENOL) 325 MG tablet, Take 650 mg by mouth every 6 (six) hours as needed., Disp: , Rfl:  .  ALPRAZolam (XANAX) 0.25 MG tablet, Take 0.5 tablets (0.125 mg total) by mouth at bedtime as needed for anxiety or sleep., Disp: 20 tablet, Rfl: 0 .  Calcium Carb-Cholecalciferol (CALCIUM 1000 + D PO), Take 1 tablet by mouth daily. , Disp: , Rfl:  .  Cholecalciferol 50 MCG (2000 UT) CAPS, Take by mouth., Disp: , Rfl:  .  Denosumab (PROLIA [place]), Inject into the skin every 6 (six) months., Disp: , Rfl:  .  fluticasone (FLONASE) 50 MCG/ACT nasal spray, Place 2 sprays into both nostrils daily., Disp: 16 g, Rfl: 6 .  glucosamine-chondroitin 500-400 MG tablet, Take 1 tablet by mouth daily., Disp: , Rfl:  .  lidocaine-prilocaine (EMLA) cream, Apply to affected area once, Disp: 30 g, Rfl: 3 .  lipase/protease/amylase (CREON) 36000 UNITS CPEP capsule, Take 2 capsule with the  first bite of each meal and 1 capsule with the first bite of each snack, Disp: 240 capsule, Rfl: 2 .  loratadine (CLARITIN) 10 MG tablet, Take 10 mg by mouth daily as needed. , Disp: , Rfl:  .  LORazepam (ATIVAN) 0.5 MG tablet, Take 1 tablet (0.5 mg total) by mouth every 6 (six) hours as needed (Nausea or vomiting)., Disp: 30 tablet, Rfl: 0 .  pantoprazole (PROTONIX) 40 MG tablet, Take 1 tablet (40 mg total) by mouth 2 (two) times daily. (Patient taking differently: Take 40 mg by mouth daily. Tapering off), Disp: 60 tablet, Rfl: 0 .  pramoxine-hydrocortisone (PROCTOCREAM-HC) 1-1 % rectal cream, PLACE 1 APPLICATION RECTALLY 2 (TWO) TIMES DAILY., Disp: 30 g, Rfl: 0 .  prochlorperazine (COMPAZINE) 5 MG tablet, Take 5 mg by mouth every 6 (six) hours as needed for nausea or vomiting., Disp: , Rfl:  .  Ranibizumab (LUCENTIS IO), Inject into the eye as needed. As needed every three months., Disp: , Rfl:  .  traZODone  (DESYREL) 50 MG tablet, TAKE 0.5 TABLETS (25 MG TOTAL) BY MOUTH AT BEDTIME. MAY INCREASE TO FULL TABLET IF NEEDED, Disp: 90 tablet, Rfl: 0 .  vitamin B-12 (CYANOCOBALAMIN) 1000 MCG tablet, Take 1,000 mcg by mouth in the morning and at bedtime., Disp: , Rfl:   Physical exam:  Vitals:   10/22/20 0907  BP: (!) 103/59  Pulse: 80  Resp: 18  Temp: (!) 96.9 F (36.1 C)  TempSrc: Tympanic  SpO2: 100%  Weight: 196 lb 11.2 oz (89.2 kg)   Physical Exam Constitutional:      General: He is not in acute distress. Cardiovascular:     Rate and Rhythm: Normal rate and regular rhythm.     Heart sounds: Normal heart sounds.  Pulmonary:     Effort: Pulmonary effort is normal.     Breath sounds: Normal breath sounds.  Abdominal:     General: Bowel sounds are normal.     Palpations: Abdomen is soft.  Skin:    General: Skin is warm and dry.  Neurological:     Mental Status: He is alert and oriented to person, place, and time.      CMP Latest Ref Rng & Units 10/22/2020  Glucose 70 - 99 mg/dL 133(H)  BUN 8 - 23 mg/dL 22  Creatinine 0.61 - 1.24 mg/dL 0.88  Sodium 135 - 145 mmol/L 141  Potassium 3.5 - 5.1 mmol/L 3.7  Chloride 98 - 111 mmol/L 104  CO2 22 - 32 mmol/L 27  Calcium 8.9 - 10.3 mg/dL 9.0  Total Protein 6.5 - 8.1 g/dL 6.7  Total Bilirubin 0.3 - 1.2 mg/dL 0.7  Alkaline Phos 38 - 126 U/L 34(L)  AST 15 - 41 U/L 40  ALT 0 - 44 U/L 37   CBC Latest Ref Rng & Units 10/22/2020  WBC 4.0 - 10.5 K/uL 3.1(L)  Hemoglobin 13.0 - 17.0 g/dL 12.6(L)  Hematocrit 39.0 - 52.0 % 37.0(L)  Platelets 150 - 400 K/uL 145(L)    No images are attached to the encounter.  PERIPHERAL VASCULAR CATHETERIZATION  Result Date: 10/19/2020 See op note  NM PET Image Initial (PI) Skull Base To Thigh  Result Date: 09/30/2020 CLINICAL DATA:  Initial treatment strategy for pancreatic carcinoma. EXAM: NUCLEAR MEDICINE PET SKULL BASE TO THIGH TECHNIQUE: 10.0 mCi F-18 FDG was injected intravenously. Full-ring PET imaging  was performed from the skull base to thigh after the radiotracer. CT data was obtained and used for attenuation correction and anatomic  localization. Fasting blood glucose: 93 mg/dl COMPARISON:  CT abdomen 09/11/2020 FINDINGS: Mediastinal blood pool activity: SUV max 2.5 Liver activity: SUV max NA NECK: No hypermetabolic lymph nodes in the neck. Hypermetabolic 18 mm nodule within the isthmus of the thyroid gland (image 72/series 3) with SUV max equal 4.7. Incidental CT findings: none CHEST: No hypermetabolic mediastinal or hilar nodes. No suspicious pulmonary nodules on the CT scan. Incidental CT findings: none ABDOMEN/PELVIS: Within the mid body the pancreas, ovoid mass measures 2.7 by 2.0 cm (image 153) with SUV max equal 7.1. No additional lesions in the pancreas. No abnormal metabolic activity in the liver. No hypermetabolic periportal peripancreatic lymph nodes. No periaortic retroperitoneal hypermetabolic lymph nodes or pelvic lymph nodes. Incidental CT findings: Small RIGHT renal artery aneurysm measuring 13 mm (image 160/CT series 3). SKELETON: No focal hypermetabolic activity to suggest skeletal metastasis. Incidental CT findings: none IMPRESSION: 1. Hypermetabolic mass in the pancreas body consistent primary pancreatic neoplasm. 2. No evidence of metastatic adenopathy in the abdomen pelvis. 3. No evidence of liver metastasis. 4. Hypermetabolic nodule in the isthmus of the thyroid gland. Recommend thyroid ultrasound to exclude thyroid carcinoma. Electronically Signed   By: Suzy Bouchard M.D.   On: 09/30/2020 14:10   ECHOCARDIOGRAM COMPLETE  Result Date: 10/09/2020    ECHOCARDIOGRAM REPORT   Patient Name:   ALESSIO MCQUILLIN Date of Exam: 10/09/2020 Medical Rec #:  [id number]       Height:       76.0 in Accession #:    [id number]      Weight:       189.0 lb Date of Birth:  1932/04/12      BSA:          2.163 m Patient Age:    89 years        BP:           119/63 mmHg Patient Gender: M               HR:            66 bpm. Exam Location:  [facility] Procedure: 2D Echo, Cardiac Doppler and Color Doppler Indications:     Ascending aortic aneurysm I71  History:         Patient has no prior history of Echocardiogram examinations.                  Risk Factors:Dyslipidemia.  Sonographer:     Sherrie Sport RDCS (AE) Referring Phys:  [place] Diagnosing Phys: Ida Rogue MD IMPRESSIONS  1. Left ventricular ejection fraction, by estimation, is 60 to 65%. The left ventricle has normal function. The left ventricle has no regional wall motion abnormalities. Left ventricular diastolic parameters are consistent with Grade I diastolic dysfunction (impaired relaxation).  2. Right ventricular systolic function is normal. The right ventricular size is normal.  3. The mitral valve is normal in structure. No evidence of mitral valve regurgitation.  4. The aortic valve was not well visualized.Unable to determine number of leaflets. Aortic valve regurgitation is not visualized. No aortic stenosis is present.  5. Aortic ascending aorta not well visualized.There is mild dilatation of the aortic root, measuring 39 mm. FINDINGS  Left Ventricle: Left ventricular ejection fraction, by estimation, is 60 to 65%. The left ventricle has normal function. The left ventricle has no regional wall motion abnormalities. The left ventricular internal cavity size was normal in size. There is  no left ventricular hypertrophy. Left ventricular diastolic parameters  are consistent with Grade I diastolic dysfunction (impaired relaxation). Right Ventricle: The right ventricular size is normal. No increase in right ventricular wall thickness. Right ventricular systolic function is normal. Left Atrium: Left atrial size was normal in size. Right Atrium: Right atrial size was normal in size. Pericardium: There is no evidence of pericardial effusion. Mitral Valve: The mitral valve is normal in structure. No evidence of mitral valve regurgitation. No  evidence of mitral valve stenosis. Tricuspid Valve: The tricuspid valve is normal in structure. Tricuspid valve regurgitation is not demonstrated. No evidence of tricuspid stenosis. Aortic Valve: The aortic valve was not well visualized. Aortic valve regurgitation is not visualized. No aortic stenosis is present. Aortic valve mean gradient measures 5.0 mmHg. Aortic valve peak gradient measures 9.1 mmHg. Aortic valve area, by VTI measures 2.55 cm. Pulmonic Valve: The pulmonic valve was normal in structure. Pulmonic valve regurgitation is not visualized. No evidence of pulmonic stenosis. Aorta: Ascending aorta not well visualized. There is mild dilatation of the aortic root, measuring 39 mm. Venous: The inferior vena cava is normal in size with greater than 50% respiratory variability, suggesting right atrial pressure of 3 mmHg. IAS/Shunts: No atrial level shunt detected by color flow Doppler.  LEFT VENTRICLE PLAX 2D LVIDd:         3.44 cm  Diastology LVIDs:         2.22 cm  LV e' medial:    6.85 cm/s LV PW:         1.17 cm  LV E/e' medial:  12.9 LV IVS:        0.93 cm  LV e' lateral:   6.85 cm/s LVOT diam:     2.20 cm  LV E/e' lateral: 12.9 LV SV:         80 LV SV Index:   37 LVOT Area:     3.80 cm  RIGHT VENTRICLE RV Basal diam:  2.85 cm RV S prime:     16.50 cm/s TAPSE (M-mode): 4.5 cm LEFT ATRIUM             Index       RIGHT ATRIUM           Index LA diam:        3.40 cm 1.57 cm/m  RA Area:     21.90 cm LA Vol (A2C):   71.5 ml 33.06 ml/m RA Volume:   63.70 ml  29.46 ml/m LA Vol (A4C):   39.3 ml 18.17 ml/m LA Biplane Vol: 55.2 ml 25.52 ml/m  AORTIC VALVE                    PULMONIC VALVE AV Area (Vmax):    2.40 cm     PV Vmax:        0.73 m/s AV Area (Vmean):   2.39 cm     PV Peak grad:   2.1 mmHg AV Area (VTI):     2.55 cm     RVOT Peak grad: 4 mmHg AV Vmax:           151.00 cm/s AV Vmean:          105.000 cm/s AV VTI:            0.313 m AV Peak Grad:      9.1 mmHg AV Mean Grad:      5.0 mmHg LVOT  Vmax:         95.30 cm/s LVOT Vmean:  65.900 cm/s LVOT VTI:          0.210 m LVOT/AV VTI ratio: 0.67  AORTA Ao Root diam: 3.82 cm MITRAL VALVE                TRICUSPID VALVE MV Area (PHT): 2.76 cm     TR Peak grad:   11.0 mmHg MV Decel Time: 275 msec     TR Vmax:        166.00 cm/s MV E velocity: 88.70 cm/s MV A velocity: 117.00 cm/s  SHUNTS MV E/A ratio:  0.76         Systemic VTI:  0.21 m                             Systemic Diam: 2.20 cm Ida Rogue MD Electronically signed by Ida Rogue MD Signature Date/Time: 10/09/2020/3:53:50 PM    Final      Assessment and plan- Patient is a 87 y.o. 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  He is here for on treatment assessment prior to cycle 1 day 15 of gemcitabine Abraxane chemotherapy neoadjuvant  Patient has some baseline leukopenia/neutropenia even prior to starting chemotherapy.  His ANC was 2.2 on day 1 cycle 1 of treatment.  It dropped down to 0.8 on day 8 cycle 1 and he therefore did not receive any chemotherapy.  His ANC is 1.6 today.  I plan to dose reduce gemcitabine further to 700 mg per metered squared and Abraxane 80 mg per metered square and he will receive on pro Neulasta today.  I will see him back in 2 weeks for cycle 2-day 1 of chemotherapy.  If his counts hold up after dose reduction and on for Neulasta I would like to do this regimen 2 weeks on 1 week off.  If he is unable to tolerate a 2-week on regimen his chemotherapy would likely run a very protracted course if done 1 week on and 1 week off.  I will get in touch with Dr. Hyman Hopes from [facility] pancreaticobiliary surgery as well to see if we could reconsider upfront surgery as an option given the potential delays associated with chemotherapy.  Patient and his wife verbalized understanding   Visit Diagnosis 1. Encounter for antineoplastic chemotherapy   2. Chemotherapy induced neutropenia (HCC)   3. Malignant neoplasm of body of pancreas ([place])      Dr. Randa Evens, MD, MPH [facility] [phone] 10/22/2020 4:16 PM

## 2020-10-22 NOTE — Patient Instructions (Addendum)
[facility]  Discharge Instructions: Thank you for choosing [facility] to provide your oncology and hematology care.  If you have a lab appointment with the [facility], please go directly to the [facility] and check in at the registration area.  Wear comfortable clothing and clothing appropriate for easy access to any Portacath or PICC line.   We strive to give you quality time with your provider. You may need to reschedule your appointment if you arrive late (15 or more minutes).  Arriving late affects you and other patients whose appointments are after yours.  Also, if you miss three or more appointments without notifying the office, you may be dismissed from the clinic at the provider's discretion.      For prescription refill requests, have your pharmacy contact our office and allow 72 hours for refills to be completed.    Today you received the following chemotherapy and/or immunotherapy agents Abraxexe, gemzar, Neulasta   To help prevent nausea and vomiting after your treatment, we encourage you to take your nausea medication as directed.  BELOW ARE SYMPTOMS THAT SHOULD BE REPORTED IMMEDIATELY: . *FEVER GREATER THAN 100.4 F (38 C) OR HIGHER . *CHILLS OR SWEATING . *NAUSEA AND VOMITING THAT IS NOT CONTROLLED WITH YOUR NAUSEA MEDICATION . *UNUSUAL SHORTNESS OF BREATH . *UNUSUAL BRUISING OR BLEEDING . *URINARY PROBLEMS (pain or burning when urinating, or frequent urination) . *BOWEL PROBLEMS (unusual diarrhea, constipation, pain near the anus) . TENDERNESS IN MOUTH AND THROAT WITH OR WITHOUT PRESENCE OF ULCERS (sore throat, sores in mouth, or a toothache) . UNUSUAL RASH, SWELLING OR PAIN  . UNUSUAL VAGINAL DISCHARGE OR ITCHING   Items with * indicate a potential emergency and should be followed up as soon as possible or go to the Emergency Department if any problems should occur.  Please show the CHEMOTHERAPY ALERT CARD or  IMMUNOTHERAPY ALERT CARD at check-in to the Emergency Department and triage nurse.  Should you have questions after your visit or need to cancel or reschedule your appointment, please contact [facility]  [phone] and follow the prompts.  Office hours are 8:00 a.m. to 4:30 p.m. Monday - Friday. Please note that voicemails left after 4:00 p.m. may not be returned until the following business day.  We are closed weekends and major holidays. You have access to a nurse at all times for urgent questions. Please call the main number to the clinic [phone] and follow the prompts.  For any non-urgent questions, you may also contact your provider using MyChart. We now offer e-Visits for anyone 30 and older to request care online for non-urgent symptoms. For details visit [email or web address].   Also download the MyChart app! Go to the app store, search "MyChart", open the app, select [facility], and log in with your MyChart username and password.  Due to Covid, a mask is required upon entering the hospital/clinic. If you do not have a mask, one will be given to you upon arrival. For doctor visits, patients may have 1 support person aged 29 or older with them. For treatment visits, patients cannot have anyone with them due to current Covid guidelines and our immunocompromised population.   Nanoparticle Albumin-Bound Paclitaxel injection What is this medicine? NANOPARTICLE ALBUMIN-BOUND PACLITAXEL (Na no PAHR ti kuhl al BYOO muhn-bound PAK li TAX el) is a chemotherapy drug. It targets fast dividing cells, like cancer cells, and causes these cells to die. This medicine is used to  treat advanced breast cancer, lung cancer, and pancreatic cancer. This medicine may be used for other purposes; ask your health care provider or pharmacist if you have questions. COMMON BRAND NAME(S): Abraxane What should I tell my health care provider before I take this medicine? They  need to know if you have any of these conditions:  kidney disease  liver disease  low blood counts, like low white cell, platelet, or red cell counts  lung or breathing disease, like asthma  tingling of the fingers or toes, or other nerve disorder  an unusual or allergic reaction to paclitaxel, albumin, other chemotherapy, other medicines, foods, dyes, or preservatives  pregnant or trying to get pregnant  breast-feeding How should I use this medicine? This drug is given as an infusion into a vein. It is administered in a hospital or clinic by a specially trained health care professional. Talk to your pediatrician regarding the use of this medicine in children. Special care may be needed. Overdosage: If you think you have taken too much of this medicine contact a poison control center or emergency room at once. NOTE: This medicine is only for you. Do not share this medicine with others. What if I miss a dose? It is important not to miss your dose. Call your doctor or health care professional if you are unable to keep an appointment. What may interact with this medicine? This medicine may interact with the following medications:  antiviral medicines for hepatitis, HIV or AIDS  certain antibiotics like erythromycin and clarithromycin  certain medicines for fungal infections like ketoconazole and itraconazole  certain medicines for seizures like carbamazepine, phenobarbital, phenytoin  gemfibrozil  nefazodone  rifampin  [place]. Ronson's wort This list may not describe all possible interactions. Give your health care provider a list of all the medicines, herbs, non-prescription drugs, or dietary supplements you use. Also tell them if you smoke, drink alcohol, or use illegal drugs. Some items may interact with your medicine. What should I watch for while using this medicine? Your condition will be monitored carefully while you are receiving this medicine. You will need important blood  work done while you are taking this medicine. This medicine can cause serious allergic reactions. If you experience allergic reactions like skin rash, itching or hives, swelling of the face, lips, or tongue, tell your doctor or health care professional right away. In some cases, you may be given additional medicines to help with side effects. Follow all directions for their use. This drug may make you feel generally unwell. This is not uncommon, as chemotherapy can affect healthy cells as well as cancer cells. Report any side effects. Continue your course of treatment even though you feel ill unless your doctor tells you to stop. Call your doctor or health care professional for advice if you get a fever, chills or sore throat, or other symptoms of a cold or flu. Do not treat yourself. This drug decreases your body's ability to fight infections. Try to avoid being around people who are sick. This medicine may increase your risk to bruise or bleed. Call your doctor or health care professional if you notice any unusual bleeding. Be careful brushing and flossing your teeth or using a toothpick because you may get an infection or bleed more easily. If you have any dental work done, tell your dentist you are receiving this medicine. Avoid taking products that contain aspirin, acetaminophen, ibuprofen, naproxen, or ketoprofen unless instructed by your doctor. These medicines may hide a fever. Do  not become pregnant while taking this medicine or for 6 months after stopping it. Women should inform their doctor if they wish to become pregnant or think they might be pregnant. Men should not father a child while taking this medicine or for 3 months after stopping it. There is a potential for serious side effects to an unborn child. Talk to your health care professional or pharmacist for more information. Do not breast-feed an infant while taking this medicine or for 2 weeks after stopping it. This medicine may  interfere with the ability to get pregnant or to father a child. You should talk to your doctor or health care professional if you are concerned about your fertility. What side effects may I notice from receiving this medicine? Side effects that you should report to your doctor or health care professional as soon as possible:  allergic reactions like skin rash, itching or hives, swelling of the face, lips, or tongue  breathing problems  changes in vision  fast, irregular heartbeat  low blood pressure  mouth sores  pain, tingling, numbness in the hands or feet  signs of decreased platelets or bleeding - bruising, pinpoint red spots on the skin, black, tarry stools, blood in the urine  signs of decreased red blood cells - unusually weak or tired, feeling faint or lightheaded, falls  signs of infection - fever or chills, cough, sore throat, pain or difficulty passing urine  signs and symptoms of liver injury like dark yellow or brown urine; general ill feeling or flu-like symptoms; light-colored stools; loss of appetite; nausea; right upper belly pain; unusually weak or tired; yellowing of the eyes or skin  swelling of the ankles, feet, hands  unusually slow heartbeat Side effects that usually do not require medical attention (report to your doctor or health care professional if they continue or are bothersome):  diarrhea  hair loss  loss of appetite  nausea, vomiting  tiredness This list may not describe all possible side effects. Call your doctor for medical advice about side effects. You may report side effects to FDA at 1-800-FDA-1088. Where should I keep my medicine? This drug is given in a hospital or clinic and will not be stored at home. NOTE: This sheet is a summary. It may not cover all possible information. If you have questions about this medicine, talk to your doctor, pharmacist, or health care provider.  2021 Elsevier/Gold Standard (2017-02-07  13:03:45)  Gemcitabine injection What is this medicine? GEMCITABINE (jem SYE ta been) is a chemotherapy drug. This medicine is used to treat many types of cancer like breast cancer, lung cancer, pancreatic cancer, and ovarian cancer. This medicine may be used for other purposes; ask your health care provider or pharmacist if you have questions. COMMON BRAND NAME(S): Gemzar, Infugem What should I tell my health care provider before I take this medicine? They need to know if you have any of these conditions:  blood disorders  infection  kidney disease  liver disease  lung or breathing disease, like asthma  recent or ongoing radiation therapy  an unusual or allergic reaction to gemcitabine, other chemotherapy, other medicines, foods, dyes, or preservatives  pregnant or trying to get pregnant  breast-feeding How should I use this medicine? This drug is given as an infusion into a vein. It is administered in a hospital or clinic by a specially trained health care professional. Talk to your pediatrician regarding the use of this medicine in children. Special care may be needed. Overdosage: If  you think you have taken too much of this medicine contact a poison control center or emergency room at once. NOTE: This medicine is only for you. Do not share this medicine with others. What if I miss a dose? It is important not to miss your dose. Call your doctor or health care professional if you are unable to keep an appointment. What may interact with this medicine?  medicines to increase blood counts like filgrastim, pegfilgrastim, sargramostim  some other chemotherapy drugs like cisplatin  vaccines Talk to your doctor or health care professional before taking any of these medicines:  acetaminophen  aspirin  ibuprofen  ketoprofen  naproxen This list may not describe all possible interactions. Give your health care provider a list of all the medicines, herbs, non-prescription  drugs, or dietary supplements you use. Also tell them if you smoke, drink alcohol, or use illegal drugs. Some items may interact with your medicine. What should I watch for while using this medicine? Visit your doctor for checks on your progress. This drug may make you feel generally unwell. This is not uncommon, as chemotherapy can affect healthy cells as well as cancer cells. Report any side effects. Continue your course of treatment even though you feel ill unless your doctor tells you to stop. In some cases, you may be given additional medicines to help with side effects. Follow all directions for their use. Call your doctor or health care professional for advice if you get a fever, chills or sore throat, or other symptoms of a cold or flu. Do not treat yourself. This drug decreases your body's ability to fight infections. Try to avoid being around people who are sick. This medicine may increase your risk to bruise or bleed. Call your doctor or health care professional if you notice any unusual bleeding. Be careful brushing and flossing your teeth or using a toothpick because you may get an infection or bleed more easily. If you have any dental work done, tell your dentist you are receiving this medicine. Avoid taking products that contain aspirin, acetaminophen, ibuprofen, naproxen, or ketoprofen unless instructed by your doctor. These medicines may hide a fever. Do not become pregnant while taking this medicine or for 6 months after stopping it. Women should inform their doctor if they wish to become pregnant or think they might be pregnant. Men should not father a child while taking this medicine and for 3 months after stopping it. There is a potential for serious side effects to an unborn child. Talk to your health care professional or pharmacist for more information. Do not breast-feed an infant while taking this medicine or for at least 1 week after stopping it. Men should inform their doctors if  they wish to father a child. This medicine may lower sperm counts. Talk with your doctor or health care professional if you are concerned about your fertility. What side effects may I notice from receiving this medicine? Side effects that you should report to your doctor or health care professional as soon as possible:  allergic reactions like skin rash, itching or hives, swelling of the face, lips, or tongue  breathing problems  pain, redness, or irritation at site where injected  signs and symptoms of a dangerous change in heartbeat or heart rhythm like chest pain; dizziness; fast or irregular heartbeat; palpitations; feeling faint or lightheaded, falls; breathing problems  signs of decreased platelets or bleeding - bruising, pinpoint red spots on the skin, black, tarry stools, blood in the urine  signs of decreased red blood cells - unusually weak or tired, feeling faint or lightheaded, falls  signs of infection - fever or chills, cough, sore throat, pain or difficulty passing urine  signs and symptoms of kidney injury like trouble passing urine or change in the amount of urine  signs and symptoms of liver injury like dark yellow or brown urine; general ill feeling or flu-like symptoms; light-colored stools; loss of appetite; nausea; right upper belly pain; unusually weak or tired; yellowing of the eyes or skin  swelling of ankles, feet, hands Side effects that usually do not require medical attention (report to your doctor or health care professional if they continue or are bothersome):  constipation  diarrhea  hair loss  loss of appetite  nausea  rash  vomiting This list may not describe all possible side effects. Call your doctor for medical advice about side effects. You may report side effects to FDA at 1-800-FDA-1088. Where should I keep my medicine? This drug is given in a hospital or clinic and will not be stored at home. NOTE: This sheet is a summary. It may not  cover all possible information. If you have questions about this medicine, talk to your doctor, pharmacist, or health care provider.  2021 Elsevier/Gold Standard (2017-08-30 18:06:11)    Pegfilgrastim injection What is this medicine? PEGFILGRASTIM (PEG fil gra stim) is a long-acting granulocyte colony-stimulating factor that stimulates the growth of neutrophils, a type of white blood cell important in the body's fight against infection. It is used to reduce the incidence of fever and infection in patients with certain types of cancer who are receiving chemotherapy that affects the bone marrow, and to increase survival after being exposed to high doses of radiation. This medicine may be used for other purposes; ask your health care provider or pharmacist if you have questions. COMMON BRAND NAME(S): Rexene Edison, Ziextenzo What should I tell my health care provider before I take this medicine? They need to know if you have any of these conditions:  kidney disease  latex allergy  ongoing radiation therapy  sickle cell disease  skin reactions to acrylic adhesives (On-Body Injector only)  an unusual or allergic reaction to pegfilgrastim, filgrastim, other medicines, foods, dyes, or preservatives  pregnant or trying to get pregnant  breast-feeding How should I use this medicine? This medicine is for injection under the skin. If you get this medicine at home, you will be taught how to prepare and give the pre-filled syringe or how to use the On-body Injector. Refer to the patient Instructions for Use for detailed instructions. Use exactly as directed. Tell your healthcare provider immediately if you suspect that the On-body Injector may not have performed as intended or if you suspect the use of the On-body Injector resulted in a missed or partial dose. It is important that you put your used needles and syringes in a special sharps container. Do not put them in a  trash can. If you do not have a sharps container, call your pharmacist or healthcare provider to get one. Talk to your pediatrician regarding the use of this medicine in children. While this drug may be prescribed for selected conditions, precautions do apply. Overdosage: If you think you have taken too much of this medicine contact a poison control center or emergency room at once. NOTE: This medicine is only for you. Do not share this medicine with others. What if I miss a dose? It is important not to miss your  dose. Call your doctor or health care professional if you miss your dose. If you miss a dose due to an On-body Injector failure or leakage, a new dose should be administered as soon as possible using a single prefilled syringe for manual use. What may interact with this medicine? Interactions have not been studied. This list may not describe all possible interactions. Give your health care provider a list of all the medicines, herbs, non-prescription drugs, or dietary supplements you use. Also tell them if you smoke, drink alcohol, or use illegal drugs. Some items may interact with your medicine. What should I watch for while using this medicine? Your condition will be monitored carefully while you are receiving this medicine. You may need blood work done while you are taking this medicine. Talk to your health care provider about your risk of cancer. You may be more at risk for certain types of cancer if you take this medicine. If you are going to need a MRI, CT scan, or other procedure, tell your doctor that you are using this medicine (On-Body Injector only). What side effects may I notice from receiving this medicine? Side effects that you should report to your doctor or health care professional as soon as possible:  allergic reactions (skin rash, itching or hives, swelling of the face, lips, or tongue)  back pain  dizziness  fever  pain, redness, or irritation at site where  injected  pinpoint red spots on the skin  red or dark-brown urine  shortness of breath or breathing problems  stomach or side pain, or pain at the shoulder  swelling  tiredness  trouble passing urine or change in the amount of urine  unusual bruising or bleeding Side effects that usually do not require medical attention (report to your doctor or health care professional if they continue or are bothersome):  bone pain  muscle pain This list may not describe all possible side effects. Call your doctor for medical advice about side effects. You may report side effects to FDA at 1-800-FDA-1088. Where should I keep my medicine? Keep out of the reach of children. If you are using this medicine at home, you will be instructed on how to store it. Throw away any unused medicine after the expiration date on the label. NOTE: This sheet is a summary. It may not cover all possible information. If you have questions about this medicine, talk to your doctor, pharmacist, or health care provider.  2021 Elsevier/Gold Standard (2019-06-28 13:20:51)

## 2020-10-23 NOTE — Progress Notes (Signed)
Spoilage occurred on 10/22/2020,Medication dispensed: Neulasta OnPro, per Debbora Lacrosse, [name] advised pharmacy of waste/spoilage of medication due to Adhesive not sticking . [employer] manufacture was advised of (spoilage/waste/defective) Defective and is processing replacement of medication waste. Finance team advised of replacement of waste.  Case #:[id number] Lot:  [id number] Exp: 08/2022  Madalyn Rob, CPhT IV Drug Replacement Specialist  [place] Phone: [phone]

## 2020-10-30 ENCOUNTER — Telehealth: Payer: Self-pay

## 2020-10-30 NOTE — Telephone Encounter (Signed)
Spoke with George Owens regarding his past chemo experience, Neulasta and other concerns. His treatment is being moved to 5/17. All other questions answered.

## 2020-11-02 ENCOUNTER — Telehealth: Payer: Self-pay | Admitting: Oncology

## 2020-11-02 NOTE — Telephone Encounter (Signed)
Left VM with patient (he had left a message after hours on Friday) to confirm his appt time tomorrow with direct extension # to call back if he had any additional questions or concerns.

## 2020-11-03 ENCOUNTER — Inpatient Hospital Stay: Payer: PPO

## 2020-11-03 ENCOUNTER — Inpatient Hospital Stay (HOSPITAL_BASED_OUTPATIENT_CLINIC_OR_DEPARTMENT_OTHER): Payer: PPO | Admitting: Oncology

## 2020-11-03 ENCOUNTER — Telehealth: Payer: Self-pay | Admitting: Internal Medicine

## 2020-11-03 VITALS — BP 116/91 | HR 77 | Temp 97.0°F | Resp 20 | Wt 186.2 lb

## 2020-11-03 DIAGNOSIS — Z95828 Presence of other vascular implants and grafts: Secondary | ICD-10-CM

## 2020-11-03 DIAGNOSIS — T451X5A Adverse effect of antineoplastic and immunosuppressive drugs, initial encounter: Secondary | ICD-10-CM

## 2020-11-03 DIAGNOSIS — C251 Malignant neoplasm of body of pancreas: Secondary | ICD-10-CM

## 2020-11-03 DIAGNOSIS — D6959 Other secondary thrombocytopenia: Secondary | ICD-10-CM | POA: Diagnosis not present

## 2020-11-03 DIAGNOSIS — Z5111 Encounter for antineoplastic chemotherapy: Secondary | ICD-10-CM | POA: Diagnosis not present

## 2020-11-03 LAB — CBC WITH DIFFERENTIAL/PLATELET
Abs Immature Granulocytes: 0.53 10*3/uL — ABNORMAL HIGH (ref 0.00–0.07)
Basophils Absolute: 0.1 10*3/uL (ref 0.0–0.1)
Basophils Relative: 1 %
Eosinophils Absolute: 0 10*3/uL (ref 0.0–0.5)
Eosinophils Relative: 0 %
HCT: 36.1 % — ABNORMAL LOW (ref 39.0–52.0)
Hemoglobin: 12.5 g/dL — ABNORMAL LOW (ref 13.0–17.0)
Immature Granulocytes: 5 %
Lymphocytes Relative: 14 %
Lymphs Abs: 1.5 10*3/uL (ref 0.7–4.0)
MCH: 31.6 pg (ref 26.0–34.0)
MCHC: 34.6 g/dL (ref 30.0–36.0)
MCV: 91.2 fL (ref 80.0–100.0)
Monocytes Absolute: 0.7 10*3/uL (ref 0.1–1.0)
Monocytes Relative: 7 %
Neutro Abs: 7.7 10*3/uL (ref 1.7–7.7)
Neutrophils Relative %: 73 %
Platelets: 86 10*3/uL — ABNORMAL LOW (ref 150–400)
RBC: 3.96 MIL/uL — ABNORMAL LOW (ref 4.22–5.81)
RDW: 14.5 % (ref 11.5–15.5)
WBC: 10.6 10*3/uL — ABNORMAL HIGH (ref 4.0–10.5)
nRBC: 0 % (ref 0.0–0.2)

## 2020-11-03 LAB — COMPREHENSIVE METABOLIC PANEL
ALT: 32 U/L (ref 0–44)
AST: 30 U/L (ref 15–41)
Albumin: 3.4 g/dL — ABNORMAL LOW (ref 3.5–5.0)
Alkaline Phosphatase: 53 U/L (ref 38–126)
Anion gap: 8 (ref 5–15)
BUN: 19 mg/dL (ref 8–23)
CO2: 25 mmol/L (ref 22–32)
Calcium: 7.8 mg/dL — ABNORMAL LOW (ref 8.9–10.3)
Chloride: 107 mmol/L (ref 98–111)
Creatinine, Ser: 0.72 mg/dL (ref 0.61–1.24)
GFR, Estimated: >60 mL/min (ref >60)
Glucose, Bld: 108 mg/dL — ABNORMAL HIGH (ref 70–99)
Potassium: 3.4 mmol/L — ABNORMAL LOW (ref 3.5–5.1)
Sodium: 140 mmol/L (ref 135–145)
Total Bilirubin: 0.7 mg/dL (ref 0.3–1.2)
Total Protein: 5.8 g/dL — ABNORMAL LOW (ref 6.5–8.1)

## 2020-11-03 MED ORDER — SODIUM CHLORIDE 0.9 % IV SOLN
Freq: Once | INTRAVENOUS | Status: AC
Start: 2020-11-03 — End: 2020-11-03
  Filled 2020-11-03: qty 250

## 2020-11-03 MED ORDER — HEPARIN SOD (PORK) LOCK FLUSH 100 UNIT/ML IV SOLN
INTRAVENOUS | Status: AC
  Filled 2020-11-03: qty 5

## 2020-11-03 MED ORDER — PROCHLORPERAZINE MALEATE 10 MG PO TABS
10.0000 mg | ORAL_TABLET | Freq: Once | ORAL | Status: AC
Start: 2020-11-03 — End: 2020-11-03
  Administered 2020-11-03: 10 mg via ORAL
  Filled 2020-11-03: qty 1

## 2020-11-03 MED ORDER — SODIUM CHLORIDE 0.9% FLUSH
10.0000 mL | Freq: Once | INTRAVENOUS | Status: AC
Start: 2020-11-03 — End: 2020-11-03
  Administered 2020-11-03: 10 mL via INTRAVENOUS
  Filled 2020-11-03: qty 10

## 2020-11-03 MED ORDER — SODIUM CHLORIDE 0.9 % IV SOLN
700.0000 mg/m2 | Freq: Once | INTRAVENOUS | Status: AC
  Administered 2020-11-03: 1520 mg via INTRAVENOUS
  Filled 2020-11-03: qty 26.3

## 2020-11-03 MED ORDER — HEPARIN SOD (PORK) LOCK FLUSH 100 UNIT/ML IV SOLN
500.0000 [IU] | Freq: Once | INTRAVENOUS | Status: DC | PRN
  Filled 2020-11-03: qty 5

## 2020-11-03 MED ORDER — HEPARIN SOD (PORK) LOCK FLUSH 100 UNIT/ML IV SOLN
500.0000 [IU] | Freq: Once | INTRAVENOUS | Status: AC
Start: 2020-11-03 — End: 2020-11-03
  Administered 2020-11-03: 500 [IU] via INTRAVENOUS
  Filled 2020-11-03: qty 5

## 2020-11-03 NOTE — Progress Notes (Signed)
Labs reviewed (Platelets 86 and Calcium 7.8, WBC 10.6) Per Dr. Janese Banks pt to receive Gemzar only, no Abraxane of On Pro at this time.

## 2020-11-03 NOTE — Progress Notes (Signed)
Labs reviewed and ok to treat per MD.  No abraxane or onpro today, only gemzar

## 2020-11-03 NOTE — Progress Notes (Signed)
Hematology/Oncology Consult note [facility]  Telephone:([phone] [phone]  Patient Care Team: Crecencio Mc, MD as PCP - General (Internal Medicine) Clent Jacks, RN as Oncology Nurse Navigator   Name of the patient: Francois Oliva  [id number]  1932-02-20   Date of visit: 11/03/20  Diagnosis- pancreatic adenocarcinoma stage I BC T2N 0M0  Chief complaint/ Reason for visit-on treatment assessment prior to cycle 2-day 1 of gemcitabine Abraxane chemotherapy  Heme/Onc history: patient is a 87 year old male with a past medical history significant for GERD, hyperlipidemia, cirrhosis.He was having symptoms of Abdominal pain and reflux and therefore underwent a CT abdomen and pelvis with contrast on 09/11/2020 which showed a hypointense ill-defined mass in the pancreatic body up to 25 mm. No extrapancreatic infiltrative density about the aorta or proximal [name]. No local regional adenopathy or distant metastatic disease. 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 This was followed by an EUS at [facility] which showed a hypoechoic mass measuring 1.8 x 1.6 cm in the pancreatic body. Endosonographic borders poorly defined. Upstream pancreatic duct dilatation. No abnormal appearing lymph nodes. Endosonographic imaging of the liver showed no lesion. Biopsy showed adenocarcinoma.  PET scan showed 2.7 x 2 cm ovoid mass in the body of the pancreas with an SUV of 7.1. 18 mm hypermetabolic nodule in the isthmus of the thyroid gland. No evidence of local regional adenopathy or distant metastatic disease.  Patient was evaluated by Dr. Hyman Hopes at [facility] for consideration of surgery and has been deemed to be upfront resectable surgical candidate.  However plan was to offer him neoadjuvant chemotherapy for 3 months to control micrometastatic disease before proceeding with definitive surgery.  If ultimately patient decides not to proceed with surgery then radiation at  [facility] also remains an option.  Interval history-patient reports that he tolerated his second dose of chemotherapy well without any significant side effects.  He continues to be independent of his ADLs.  Denies any significant nausea or vomiting or bowel complaints.  ECOG PS- 1 Pain scale- 0   Review of systems- Review of Systems  Constitutional: Negative for chills, fever, malaise/fatigue and weight loss.  HENT: Negative for congestion, ear discharge and nosebleeds.   Eyes: Negative for blurred vision.  Respiratory: Negative for cough, hemoptysis, sputum production, shortness of breath and wheezing.   Cardiovascular: Negative for chest pain, palpitations, orthopnea and claudication.  Gastrointestinal: Negative for abdominal pain, blood in stool, constipation, diarrhea, heartburn, melena, nausea and vomiting.  Genitourinary: Negative for dysuria, flank pain, frequency, hematuria and urgency.  Musculoskeletal: Negative for back pain, joint pain and myalgias.  Skin: Negative for rash.  Neurological: Negative for dizziness, tingling, focal weakness, seizures, weakness and headaches.  Endo/Heme/Allergies: Does not bruise/bleed easily.  Psychiatric/Behavioral: Negative for depression and suicidal ideas. The patient does not have insomnia.        Allergies  Allergen Reactions  . Kenalog [Triamcinolone Acetonide]     Blindness X 3 Days     Past Medical History:  Diagnosis Date  . Allergy   . Aneurysm of abdominal aorta (HCC)   . Basal cell carcinoma    right temple Dr. Evorn Gong 12/2018  . Chicken pox   . Colon polyps   . COVID-19 virus infection 03/16/2020  . GERD (gastroesophageal reflux disease)   . history of prostate CA 2006  . Hyperlipidemia   . Leg cramps   . Neuropathy   . Osteoporosis      Past Surgical History:  Procedure Laterality  Date  . CATARACT EXTRACTION Right   . CATARACT EXTRACTION W/PHACO Left 12/26/2017   Procedure: CATARACT EXTRACTION PHACO AND INTRAOCULAR  LENS PLACEMENT ([place])  COMPLICATED LEFT;  Surgeon: Leandrew Koyanagi, MD;  Location: [place];  Service: Ophthalmology;  Laterality: Left;  [place]  . colonoscopy    . ESOPHAGOGASTRODUODENOSCOPY (EGD) WITH PROPOFOL    . EYE MUSCLE SURGERY Left   . PORTA CATH INSERTION N/A 10/19/2020   Procedure: PORTA CATH INSERTION;  Surgeon: Algernon Huxley, MD;  Location: [place] [facility];  Service: Cardiovascular;  Laterality: N/A;  . TONSILLECTOMY AND ADENOIDECTOMY      Social History   Socioeconomic History  . Marital status: Married    Spouse name: Eloise  . Number of children: 4  . Years of education: Not on file  . Highest education level: Not on file  Occupational History  . Occupation: retired  Tobacco Use  . Smoking status: Never Smoker  . Smokeless tobacco: Never Used  Vaping Use  . Vaping Use: Never used  Substance and Sexual Activity  . Alcohol use: No  . Drug use: No  . Sexual activity: Yes    Partners: Female  Other Topics Concern  . Not on file  Social History Narrative   Lives with wife in a 5 story retirement home.  He lives on the first floor.  4 year college degree.  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     Social Determinants of Health   Financial Resource Strain: Low Risk   . Difficulty of Paying Living Expenses: Not hard at all  Food Insecurity: Not on file  Transportation Needs: No Transportation Needs  . Lack of Transportation (Medical): No  . Lack of Transportation (Non-Medical): No  Physical Activity: Not on file  Stress: No Stress Concern Present  . Feeling of Stress : Not at all  Social Connections: Unknown  . Frequency of Communication with Friends and Family: Not on file  . Frequency of Social Gatherings with Friends and Family: Not on file  . Attends Religious Services: Not on file  . Active Member of Clubs or Organizations: Not on file  . Attends Archivist Meetings: Not on file  .  Marital Status: Married  Human resources officer Violence: Not on file    Family History  Problem Relation Age of Onset  . Stroke Mother   . Arthritis Father   . Heart disease Father   . Brain cancer Brother        1/2 brother (possibly related to mother NOT related to patient)  . Colon cancer Neg Hx   . Esophageal cancer Neg Hx   . Pancreatic cancer Neg Hx   . Stomach cancer Neg Hx   . Liver disease Neg Hx      Current Outpatient Medications:  .  acetaminophen (TYLENOL) 325 MG tablet, Take 650 mg by mouth every 6 (six) hours as needed., Disp: , Rfl:  .  ALPRAZolam (XANAX) 0.25 MG tablet, Take 0.5 tablets (0.125 mg total) by mouth at bedtime as needed for anxiety or sleep., Disp: 20 tablet, Rfl: 0 .  Calcium Carb-Cholecalciferol (CALCIUM 1000 + D PO), Take 1 tablet by mouth daily. , Disp: , Rfl:  .  Cholecalciferol 50 MCG (2000 UT) CAPS, Take by mouth., Disp: , Rfl:  .  Denosumab (PROLIA [place]), Inject into the skin every 6 (six) months., Disp: , Rfl:  .  fluticasone (FLONASE) 50 MCG/ACT nasal spray,  Place 2 sprays into both nostrils daily., Disp: 16 g, Rfl: 6 .  glucosamine-chondroitin 500-400 MG tablet, Take 1 tablet by mouth daily., Disp: , Rfl:  .  lidocaine-prilocaine (EMLA) cream, Apply to affected area once, Disp: 30 g, Rfl: 3 .  lipase/protease/amylase (CREON) 36000 UNITS CPEP capsule, Take 2 capsule with the first bite of each meal and 1 capsule with the first bite of each snack, Disp: 240 capsule, Rfl: 2 .  loratadine (CLARITIN) 10 MG tablet, Take 10 mg by mouth daily as needed. , Disp: , Rfl:  .  LORazepam (ATIVAN) 0.5 MG tablet, Take 1 tablet (0.5 mg total) by mouth every 6 (six) hours as needed (Nausea or vomiting)., Disp: 30 tablet, Rfl: 0 .  pantoprazole (PROTONIX) 40 MG tablet, Take 1 tablet (40 mg total) by mouth 2 (two) times daily. (Patient taking differently: Take 40 mg by mouth daily. Tapering off), Disp: 60 tablet, Rfl: 0 .  pramoxine-hydrocortisone (PROCTOCREAM-HC) 1-1 %  rectal cream, PLACE 1 APPLICATION RECTALLY 2 (TWO) TIMES DAILY., Disp: 30 g, Rfl: 0 .  prochlorperazine (COMPAZINE) 5 MG tablet, Take 5 mg by mouth every 6 (six) hours as needed for nausea or vomiting., Disp: , Rfl:  .  Ranibizumab (LUCENTIS IO), Inject into the eye as needed. As needed every three months., Disp: , Rfl:  .  traZODone (DESYREL) 50 MG tablet, TAKE 0.5 TABLETS (25 MG TOTAL) BY MOUTH AT BEDTIME. MAY INCREASE TO FULL TABLET IF NEEDED, Disp: 90 tablet, Rfl: 0 .  vitamin B-12 (CYANOCOBALAMIN) 1000 MCG tablet, Take 1,000 mcg by mouth in the morning and at bedtime., Disp: , Rfl:  No current facility-administered medications for this visit.  Facility-Administered Medications Ordered in Other Visits:  .  heparin lock flush 100 unit/mL, 500 Units, Intracatheter, Once PRN, Sindy Guadeloupe, MD  Physical exam:  Vitals:   11/03/20 1047  BP: (!) 116/91  Pulse: 77  Resp: 20  Temp: (!) 97 F (36.1 C)  TempSrc: Tympanic  SpO2: 100%  Weight: 186 lb 3.2 oz (84.5 kg)   Physical Exam Constitutional:      General: He is not in acute distress. Cardiovascular:     Rate and Rhythm: Normal rate and regular rhythm.     Heart sounds: Normal heart sounds.  Pulmonary:     Effort: Pulmonary effort is normal.     Breath sounds: Normal breath sounds.  Skin:    General: Skin is warm and dry.  Neurological:     Mental Status: He is alert and oriented to person, place, and time.      CMP Latest Ref Rng & Units 11/03/2020  Glucose 70 - 99 mg/dL 108(H)  BUN 8 - 23 mg/dL 19  Creatinine 0.61 - 1.24 mg/dL 0.72  Sodium 135 - 145 mmol/L 140  Potassium 3.5 - 5.1 mmol/L 3.4(L)  Chloride 98 - 111 mmol/L 107  CO2 22 - 32 mmol/L 25  Calcium 8.9 - 10.3 mg/dL 7.8(L)  Total Protein 6.5 - 8.1 g/dL 5.8(L)  Total Bilirubin 0.3 - 1.2 mg/dL 0.7  Alkaline Phos 38 - 126 U/L 53  AST 15 - 41 U/L 30  ALT 0 - 44 U/L 32   CBC Latest Ref Rng & Units 11/03/2020  WBC 4.0 - 10.5 K/uL 10.6(H)  Hemoglobin 13.0 - 17.0  g/dL 12.5(L)  Hematocrit 39.0 - 52.0 % 36.1(L)  Platelets 150 - 400 K/uL 86(L)    No images are attached to the encounter.  PERIPHERAL VASCULAR CATHETERIZATION  Result Date:  10/19/2020 See op note  ECHOCARDIOGRAM COMPLETE  Result Date: 10/09/2020    ECHOCARDIOGRAM REPORT   Patient Name:   PERCIVAL KURATA Date of Exam: 10/09/2020 Medical Rec #:  [id number]       Height:       76.0 in Accession #:    [id number]      Weight:       189.0 lb Date of Birth:  Dec 19, 1931      BSA:          2.163 m Patient Age:    62 years        BP:           119/63 mmHg Patient Gender: M               HR:           66 bpm. Exam Location:  [facility] Procedure: 2D Echo, Cardiac Doppler and Color Doppler Indications:     Ascending aortic aneurysm I71  History:         Patient has no prior history of Echocardiogram examinations.                  Risk Factors:Dyslipidemia.  Sonographer:     Sherrie Sport RDCS (AE) Referring Phys:  [place] Diagnosing Phys: Ida Rogue MD IMPRESSIONS  1. Left ventricular ejection fraction, by estimation, is 60 to 65%. The left ventricle has normal function. The left ventricle has no regional wall motion abnormalities. Left ventricular diastolic parameters are consistent with Grade I diastolic dysfunction (impaired relaxation).  2. Right ventricular systolic function is normal. The right ventricular size is normal.  3. The mitral valve is normal in structure. No evidence of mitral valve regurgitation.  4. The aortic valve was not well visualized.Unable to determine number of leaflets. Aortic valve regurgitation is not visualized. No aortic stenosis is present.  5. Aortic ascending aorta not well visualized.There is mild dilatation of the aortic root, measuring 39 mm. FINDINGS  Left Ventricle: Left ventricular ejection fraction, by estimation, is 60 to 65%. The left ventricle has normal function. The left ventricle has no regional wall motion abnormalities. The left ventricular internal  cavity size was normal in size. There is  no left ventricular hypertrophy. Left ventricular diastolic parameters are consistent with Grade I diastolic dysfunction (impaired relaxation). Right Ventricle: The right ventricular size is normal. No increase in right ventricular wall thickness. Right ventricular systolic function is normal. Left Atrium: Left atrial size was normal in size. Right Atrium: Right atrial size was normal in size. Pericardium: There is no evidence of pericardial effusion. Mitral Valve: The mitral valve is normal in structure. No evidence of mitral valve regurgitation. No evidence of mitral valve stenosis. Tricuspid Valve: The tricuspid valve is normal in structure. Tricuspid valve regurgitation is not demonstrated. No evidence of tricuspid stenosis. Aortic Valve: The aortic valve was not well visualized. Aortic valve regurgitation is not visualized. No aortic stenosis is present. Aortic valve mean gradient measures 5.0 mmHg. Aortic valve peak gradient measures 9.1 mmHg. Aortic valve area, by VTI measures 2.55 cm. Pulmonic Valve: The pulmonic valve was normal in structure. Pulmonic valve regurgitation is not visualized. No evidence of pulmonic stenosis. Aorta: Ascending aorta not well visualized. There is mild dilatation of the aortic root, measuring 39 mm. Venous: The inferior vena cava is normal in size with greater than 50% respiratory variability, suggesting right atrial pressure of 3 mmHg. IAS/Shunts: No atrial level shunt detected by color flow Doppler.  LEFT VENTRICLE PLAX 2D LVIDd:         3.44 cm  Diastology LVIDs:         2.22 cm  LV e' medial:    6.85 cm/s LV PW:         1.17 cm  LV E/e' medial:  12.9 LV IVS:        0.93 cm  LV e' lateral:   6.85 cm/s LVOT diam:     2.20 cm  LV E/e' lateral: 12.9 LV SV:         80 LV SV Index:   37 LVOT Area:     3.80 cm  RIGHT VENTRICLE RV Basal diam:  2.85 cm RV S prime:     16.50 cm/s TAPSE (M-mode): 4.5 cm LEFT ATRIUM             Index        RIGHT ATRIUM           Index LA diam:        3.40 cm 1.57 cm/m  RA Area:     21.90 cm LA Vol (A2C):   71.5 ml 33.06 ml/m RA Volume:   63.70 ml  29.46 ml/m LA Vol (A4C):   39.3 ml 18.17 ml/m LA Biplane Vol: 55.2 ml 25.52 ml/m  AORTIC VALVE                    PULMONIC VALVE AV Area (Vmax):    2.40 cm     PV Vmax:        0.73 m/s AV Area (Vmean):   2.39 cm     PV Peak grad:   2.1 mmHg AV Area (VTI):     2.55 cm     RVOT Peak grad: 4 mmHg AV Vmax:           151.00 cm/s AV Vmean:          105.000 cm/s AV VTI:            0.313 m AV Peak Grad:      9.1 mmHg AV Mean Grad:      5.0 mmHg LVOT Vmax:         95.30 cm/s LVOT Vmean:        65.900 cm/s LVOT VTI:          0.210 m LVOT/AV VTI ratio: 0.67  AORTA Ao Root diam: 3.82 cm MITRAL VALVE                TRICUSPID VALVE MV Area (PHT): 2.76 cm     TR Peak grad:   11.0 mmHg MV Decel Time: 275 msec     TR Vmax:        166.00 cm/s MV E velocity: 88.70 cm/s MV A velocity: 117.00 cm/s  SHUNTS MV E/A ratio:  0.76         Systemic VTI:  0.21 m                             Systemic Diam: 2.20 cm Ida Rogue MD Electronically signed by Ida Rogue MD Signature Date/Time: 10/09/2020/3:53:50 PM    Final      Assessment and plan- Patient is a 87 y.o. 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    He is here for on treatment assessment prior to cycle 2-day 1 of gemcitabine Abraxane chemotherapy neoadjuvant  Patient last received treatment about 10  days ago.  He received on for Neulasta at that time.  White count is improved today at 10.6.  However platelet counts are down to 86 today.  I will therefore give him only single agent gemcitabine today at 700 mg per metered square and hold off on giving Abraxane.  Between his neutropenia and thrombocytopenia we have not been able to consistently give him chemotherapy 2 weeks on 1 week off.  He will return to clinic in 2 weeks and see covering MD for consideration of gemcitabine chemotherapy plus minus Abraxane.   If platelet counts are more than 100 it would be reasonable to consider Abraxane along with gemcitabine.  If he has any evidence of leukopenia he will get on pro Neulasta as well.  He is not getting any on pro Neulasta today.  I will see him back in 4 weeks for gemcitabine plus minus Abraxane plus minus on pro Neulasta  Plan is to offer him about 3 months of chemotherapy with repeat scans in July 2022 and consideration for surgery versus radiation treatment at that time   Visit Diagnosis 1. Encounter for antineoplastic chemotherapy   2. Malignant neoplasm of body of pancreas ([place])   3. Chemotherapy-induced thrombocytopenia      Dr. Randa Evens, MD, MPH [facility] at [facility] [phone] 11/03/2020 1:00 PM

## 2020-11-03 NOTE — Telephone Encounter (Signed)
Patient called in about appointment with Dr.Atkins wanted to know if Dr.Tullo still wants him to do he would like a call

## 2020-11-03 NOTE — Patient Instructions (Signed)
C[facility] CENTER [place] [facility]  Discharge Instructions: Thank you for choosing [facility] to provide your oncology and hematology care.  If you have a lab appointment with the Cancer Center, please go directly to the Cancer Center and check in at the registration area.  Wear comfortable clothing and clothing appropriate for easy access to any Portacath or PICC line.   We strive to give you quality time with your provider. You may need to reschedule your appointment if you arrive late (15 or more minutes).  Arriving late affects you and other patients whose appointments are after yours.  Also, if you miss three or more appointments without notifying the office, you may be dismissed from the clinic at the provider's discretion.      For prescription refill requests, have your pharmacy contact our office and allow 72 hours for refills to be completed.    Today you received the following chemotherapy and/or immunotherapy agents Gemzar   To help prevent nausea and vomiting after your treatment, we encourage you to take your nausea medication as directed.  BELOW ARE SYMPTOMS THAT SHOULD BE REPORTED IMMEDIATELY: *FEVER GREATER THAN 100.4 F (38 C) OR HIGHER *CHILLS OR SWEATING *NAUSEA AND VOMITING THAT IS NOT CONTROLLED WITH YOUR NAUSEA MEDICATION *UNUSUAL SHORTNESS OF BREATH *UNUSUAL BRUISING OR BLEEDING *URINARY PROBLEMS (pain or burning when urinating, or frequent urination) *BOWEL PROBLEMS (unusual diarrhea, constipation, pain near the anus) TENDERNESS IN MOUTH AND THROAT WITH OR WITHOUT PRESENCE OF ULCERS (sore throat, sores in mouth, or a toothache) UNUSUAL RASH, SWELLING OR PAIN  UNUSUAL VAGINAL DISCHARGE OR ITCHING   Items with * indicate a potential emergency and should be followed up as soon as possible or go to the Emergency Department if any problems should occur.  Please show the CHEMOTHERAPY ALERT CARD or IMMUNOTHERAPY ALERT CARD at check-in to the  Emergency Department and triage nurse.  Should you have questions after your visit or need to cancel or reschedule your appointment, please contact CANCER CENTER [place] [facility]  [phone] and follow the prompts.  Office hours are 8:00 a.m. to 4:30 p.m. Monday - Friday. Please note that voicemails left after 4:00 p.m. may not be returned until the following business day.  We are closed weekends and major holidays. You have access to a nurse at all times for urgent questions. Please call the main number to the clinic [phone] and follow the prompts.  For any non-urgent questions, you may also contact your provider using MyChart. We now offer e-Visits for anyone 18 and older to request care online for non-urgent symptoms. For details visit [email or web address].   Also download the MyChart app! Go to the app store, search "MyChart", open the app, select [facility], and log in with your MyChart username and password.  Due to Covid, a mask is required upon entering the hospital/clinic. If you do not have a mask, one will be given to you upon arrival. For doctor visits, patients may have 1 support person aged 18 or older with them. For treatment visits, patients cannot have anyone with them due to current Covid guidelines and our immunocompromised population.  

## 2020-11-04 NOTE — Telephone Encounter (Signed)
Left message for patient to return call back.  

## 2020-11-04 NOTE — Telephone Encounter (Signed)
Patient has been notified

## 2020-11-06 ENCOUNTER — Telehealth: Payer: Self-pay | Admitting: Internal Medicine

## 2020-11-06 ENCOUNTER — Other Ambulatory Visit: Payer: PPO

## 2020-11-06 ENCOUNTER — Ambulatory Visit: Payer: PPO | Admitting: Oncology

## 2020-11-06 ENCOUNTER — Inpatient Hospital Stay: Payer: PPO

## 2020-11-06 ENCOUNTER — Ambulatory Visit: Payer: PPO

## 2020-11-06 ENCOUNTER — Inpatient Hospital Stay: Payer: PPO | Admitting: Oncology

## 2020-11-06 NOTE — Telephone Encounter (Signed)
I AGREE WITH SUSPENDING PROLIA DURING CHEMOTHERAPY, AND SUPPLEMENTING CALCIUM .  GOAL IS 1800 MG DAILY THROUGH DIET AND SUPPLEMENTS, UNLESS DR RAO SUGGESTED AN ALTERNATIVE AMOUNT

## 2020-11-06 NOTE — Telephone Encounter (Signed)
Patient return call advised PCP out of but as soon as providers approves okay to give Prolia that it is here and can be scheduled. Patient stated his calcium scores ar low per cancer center also should he be taking calcium.

## 2020-11-06 NOTE — Telephone Encounter (Signed)
PT called in to advise that Dr.Rao recommend him to not get his Prolia shot right now. PT wants to know what Dr.Tullo says about this and if she agrees should he take be taking more calcium.

## 2020-11-06 NOTE — Telephone Encounter (Signed)
Left message for patient to call office.  

## 2020-11-07 ENCOUNTER — Other Ambulatory Visit: Payer: Self-pay | Admitting: Internal Medicine

## 2020-11-09 ENCOUNTER — Ambulatory Visit: Payer: PPO

## 2020-11-09 NOTE — Telephone Encounter (Signed)
Patient aware and will suspend Prolia for now and start Calcium 1800 mg as advised.

## 2020-11-10 ENCOUNTER — Ambulatory Visit: Payer: PPO

## 2020-11-10 ENCOUNTER — Other Ambulatory Visit: Payer: PPO

## 2020-11-10 ENCOUNTER — Ambulatory Visit: Payer: PPO | Admitting: Oncology

## 2020-11-12 ENCOUNTER — Ambulatory Visit: Payer: PPO | Admitting: Cardiothoracic Surgery

## 2020-11-12 ENCOUNTER — Other Ambulatory Visit: Payer: PPO

## 2020-11-13 ENCOUNTER — Other Ambulatory Visit: Payer: Self-pay

## 2020-11-13 MED ORDER — PANTOPRAZOLE SODIUM 40 MG PO TBEC: 40.0000 mg | DELAYED_RELEASE_TABLET | Freq: Every day | ORAL | 0 refills | Status: DC

## 2020-11-17 ENCOUNTER — Inpatient Hospital Stay (HOSPITAL_BASED_OUTPATIENT_CLINIC_OR_DEPARTMENT_OTHER): Payer: PPO | Admitting: Oncology

## 2020-11-17 ENCOUNTER — Inpatient Hospital Stay: Payer: PPO

## 2020-11-17 VITALS — BP 119/78 | HR 77 | Temp 96.8°F | Resp 18 | Wt 185.9 lb

## 2020-11-17 DIAGNOSIS — C251 Malignant neoplasm of body of pancreas: Secondary | ICD-10-CM

## 2020-11-17 DIAGNOSIS — Z5111 Encounter for antineoplastic chemotherapy: Secondary | ICD-10-CM | POA: Diagnosis not present

## 2020-11-17 DIAGNOSIS — Z95828 Presence of other vascular implants and grafts: Secondary | ICD-10-CM

## 2020-11-17 LAB — CBC WITH DIFFERENTIAL/PLATELET
Abs Immature Granulocytes: 0.02 10*3/uL (ref 0.00–0.07)
Basophils Absolute: 0 10*3/uL (ref 0.0–0.1)
Basophils Relative: 0 %
Eosinophils Absolute: 0.1 10*3/uL (ref 0.0–0.5)
Eosinophils Relative: 1 %
HCT: 34.7 % — ABNORMAL LOW (ref 39.0–52.0)
Hemoglobin: 11.9 g/dL — ABNORMAL LOW (ref 13.0–17.0)
Immature Granulocytes: 0 %
Lymphocytes Relative: 25 %
Lymphs Abs: 1.3 10*3/uL (ref 0.7–4.0)
MCH: 31.2 pg (ref 26.0–34.0)
MCHC: 34.3 g/dL (ref 30.0–36.0)
MCV: 90.8 fL (ref 80.0–100.0)
Monocytes Absolute: 0.4 10*3/uL (ref 0.1–1.0)
Monocytes Relative: 8 %
Neutro Abs: 3.5 10*3/uL (ref 1.7–7.7)
Neutrophils Relative %: 66 %
Platelets: 145 10*3/uL — ABNORMAL LOW (ref 150–400)
RBC: 3.82 MIL/uL — ABNORMAL LOW (ref 4.22–5.81)
RDW: 14.9 % (ref 11.5–15.5)
WBC: 5.3 10*3/uL (ref 4.0–10.5)
nRBC: 0 % (ref 0.0–0.2)

## 2020-11-17 LAB — COMPREHENSIVE METABOLIC PANEL
ALT: 34 U/L (ref 0–44)
AST: 33 U/L (ref 15–41)
Albumin: 4.1 g/dL (ref 3.5–5.0)
Alkaline Phosphatase: 40 U/L (ref 38–126)
Anion gap: 10 (ref 5–15)
BUN: 29 mg/dL — ABNORMAL HIGH (ref 8–23)
CO2: 25 mmol/L (ref 22–32)
Calcium: 8.8 mg/dL — ABNORMAL LOW (ref 8.9–10.3)
Chloride: 102 mmol/L (ref 98–111)
Creatinine, Ser: 0.84 mg/dL (ref 0.61–1.24)
GFR, Estimated: >60 mL/min (ref >60)
Glucose, Bld: 122 mg/dL — ABNORMAL HIGH (ref 70–99)
Potassium: 3.7 mmol/L (ref 3.5–5.1)
Sodium: 137 mmol/L (ref 135–145)
Total Bilirubin: 0.8 mg/dL (ref 0.3–1.2)
Total Protein: 6.5 g/dL (ref 6.5–8.1)

## 2020-11-17 MED ORDER — HEPARIN SOD (PORK) LOCK FLUSH 100 UNIT/ML IV SOLN
500.0000 [IU] | Freq: Once | INTRAVENOUS | Status: AC
  Administered 2020-11-17: 500 [IU] via INTRAVENOUS
  Filled 2020-11-17: qty 5

## 2020-11-17 MED ORDER — SODIUM CHLORIDE 0.9 % IV SOLN
700.0000 mg/m2 | Freq: Once | INTRAVENOUS | Status: AC
  Administered 2020-11-17: 1520 mg via INTRAVENOUS
  Filled 2020-11-17: qty 26.3

## 2020-11-17 MED ORDER — HEPARIN SOD (PORK) LOCK FLUSH 100 UNIT/ML IV SOLN
500.0000 [IU] | Freq: Once | INTRAVENOUS | Status: DC
  Filled 2020-11-17: qty 5

## 2020-11-17 MED ORDER — PACLITAXEL PROTEIN-BOUND CHEMO INJECTION 100 MG
80.0000 mg/m2 | Freq: Once | INTRAVENOUS | Status: AC
  Administered 2020-11-17: 175 mg via INTRAVENOUS
  Filled 2020-11-17: qty 35

## 2020-11-17 MED ORDER — SODIUM CHLORIDE 0.9% FLUSH
10.0000 mL | INTRAVENOUS | Status: DC | PRN
  Administered 2020-11-17: 10 mL via INTRAVENOUS
  Filled 2020-11-17: qty 10

## 2020-11-17 MED ORDER — SODIUM CHLORIDE 0.9 % IV SOLN
Freq: Once | INTRAVENOUS | Status: AC
  Filled 2020-11-17: qty 250

## 2020-11-17 MED ORDER — HEPARIN SOD (PORK) LOCK FLUSH 100 UNIT/ML IV SOLN
INTRAVENOUS | Status: AC
  Filled 2020-11-17: qty 5

## 2020-11-17 MED ORDER — PROCHLORPERAZINE MALEATE 10 MG PO TABS
10.0000 mg | ORAL_TABLET | Freq: Once | ORAL | Status: AC
Start: 2020-11-17 — End: 2020-11-17
  Administered 2020-11-17: 10 mg via ORAL

## 2020-11-17 NOTE — Patient Instructions (Signed)
[facility] ONCOLOGY  Discharge Instructions: Thank you for choosing [facility] to provide your oncology and hematology care.  If you have a lab appointment with the [facility], please go directly to the [facility] and check in at the registration area.  Wear comfortable clothing and clothing appropriate for easy access to any Portacath or PICC line.   We strive to give you quality time with your provider. You may need to reschedule your appointment if you arrive late (15 or more minutes).  Arriving late affects you and other patients whose appointments are after yours.  Also, if you miss three or more appointments without notifying the office, you may be dismissed from the clinic at the provider's discretion.      For prescription refill requests, have your pharmacy contact our office and allow 72 hours for refills to be completed.    Today you received the following chemotherapy and/or immunotherapy agents: Gemzar / Abraxane   To help prevent nausea and vomiting after your treatment, we encourage you to take your nausea medication as directed.  BELOW ARE SYMPTOMS THAT SHOULD BE REPORTED IMMEDIATELY: . *FEVER GREATER THAN 100.4 F (38 C) OR HIGHER . *CHILLS OR SWEATING . *NAUSEA AND VOMITING THAT IS NOT CONTROLLED WITH YOUR NAUSEA MEDICATION . *UNUSUAL SHORTNESS OF BREATH . *UNUSUAL BRUISING OR BLEEDING . *URINARY PROBLEMS (pain or burning when urinating, or frequent urination) . *BOWEL PROBLEMS (unusual diarrhea, constipation, pain near the anus) . TENDERNESS IN MOUTH AND THROAT WITH OR WITHOUT PRESENCE OF ULCERS (sore throat, sores in mouth, or a toothache) . UNUSUAL RASH, SWELLING OR PAIN  . UNUSUAL VAGINAL DISCHARGE OR ITCHING   Items with * indicate a potential emergency and should be followed up as soon as possible or go to the Emergency Department if any problems should occur.  Please show the CHEMOTHERAPY ALERT CARD or  IMMUNOTHERAPY ALERT CARD at check-in to the Emergency Department and triage nurse.  Should you have questions after your visit or need to cancel or reschedule your appointment, please contact [facility]  [phone] and follow the prompts.  Office hours are 8:00 a.m. to 4:30 p.m. Monday - Friday. Please note that voicemails left after 4:00 p.m. may not be returned until the following business day.  We are closed weekends and major holidays. You have access to a nurse at all times for urgent questions. Please call the main number to the clinic [phone] and follow the prompts.  For any non-urgent questions, you may also contact your provider using MyChart. We now offer e-Visits for anyone 47 and older to request care online for non-urgent symptoms. For details visit [email or web address].   Also download the MyChart app! Go to the app store, search "MyChart", open the app, select [facility], and log in with your MyChart username and password.  Due to Covid, a mask is required upon entering the hospital/clinic. If you do not have a mask, one will be given to you upon arrival. For doctor visits, patients may have 1 support person aged 61 or older with them. For treatment visits, patients cannot have anyone with them due to current Covid guidelines and our immunocompromised population.

## 2020-11-17 NOTE — Progress Notes (Signed)
Hematology/Oncology Consult note [facility]  Telephone:([phone] [phone]  Patient Care Team: Crecencio Mc, MD as PCP - General (Internal Medicine) Clent Jacks, RN as Oncology Nurse Navigator   Name of the patient: George Owens  [id number]  Aug 14, 1931   Date of visit: 11/17/20  Diagnosis- pancreatic adenocarcinoma stage I BC T2N 0M0  Chief complaint/ Reason for visit-on treatment assessment prior to cycle 2-day 1 of gemcitabine Abraxane chemotherapy  Heme/Onc history: patient is a 87 year old male with a past medical history significant for GERD, hyperlipidemia, cirrhosis.He was having symptoms of Abdominal pain and reflux and therefore underwent a CT abdomen and pelvis with contrast on 09/11/2020 which showed a hypointense ill-defined mass in the pancreatic body up to 25 mm. No extrapancreatic infiltrative density about the aorta or proximal [name]. No local regional adenopathy or distant metastatic disease. 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 This was followed by an EUS at [facility] which showed a hypoechoic mass measuring 1.8 x 1.6 cm in the pancreatic body. Endosonographic borders poorly defined. Upstream pancreatic duct dilatation. No abnormal appearing lymph nodes. Endosonographic imaging of the liver showed no lesion. Biopsy showed adenocarcinoma.  PET scan showed 2.7 x 2 cm ovoid mass in the body of the pancreas with an SUV of 7.1. 18 mm hypermetabolic nodule in the isthmus of the thyroid gland. No evidence of local regional adenopathy or distant metastatic disease.  Patient was evaluated by Dr. Hyman Hopes at [name] for consideration of surgery and has been deemed to be upfront resectable surgical candidate.  However plan was to offer him neoadjuvant chemotherapy for 3 months to control micrometastatic disease before proceeding with definitive surgery.  If ultimately patient decides not to proceed with surgery then radiation at  [facility] also remains an option.  Interval history-patient reports that he tolerating his chemotherapy well without any significant side effects.  He continues to be independent of his ADLs.  His appetite is stable.  Denies any significant nausea or vomiting or bowel complaints.  ECOG PS- 1 Pain scale- 0   Review of systems- Review of Systems  Constitutional: Negative for chills, fever, malaise/fatigue and weight loss.  HENT: Negative for congestion, ear discharge and nosebleeds.   Eyes: Negative for blurred vision.  Respiratory: Negative for cough, hemoptysis, sputum production, shortness of breath and wheezing.   Cardiovascular: Negative for chest pain, palpitations, orthopnea and claudication.  Gastrointestinal: Negative for abdominal pain, blood in stool, constipation, diarrhea, heartburn, melena, nausea and vomiting.  Genitourinary: Negative for dysuria, flank pain, frequency, hematuria and urgency.  Musculoskeletal: Negative for back pain, joint pain and myalgias.  Skin: Negative for rash.  Neurological: Negative for dizziness, tingling, focal weakness, seizures, weakness and headaches.  Endo/Heme/Allergies: Does not bruise/bleed easily.  Psychiatric/Behavioral: Negative for depression and suicidal ideas. The patient does not have insomnia.        Allergies  Allergen Reactions  . Kenalog [Triamcinolone Acetonide]     Blindness X 3 Days     Past Medical History:  Diagnosis Date  . Allergy   . Aneurysm of abdominal aorta (HCC)   . Basal cell carcinoma    right temple Dr. Evorn Gong 12/2018  . Chicken pox   . Colon polyps   . COVID-19 virus infection 03/16/2020  . GERD (gastroesophageal reflux disease)   . history of prostate CA 2006  . Hyperlipidemia   . Leg cramps   . Neuropathy   . Osteoporosis      Past Surgical History:  Procedure Laterality Date  . CATARACT EXTRACTION Right   . CATARACT EXTRACTION W/PHACO Left 12/26/2017   Procedure: CATARACT EXTRACTION PHACO AND  INTRAOCULAR LENS PLACEMENT ([place])  COMPLICATED LEFT;  Surgeon: Leandrew Koyanagi, MD;  Location: [facility];  Service: Ophthalmology;  Laterality: Left;  [place]  . colonoscopy    . ESOPHAGOGASTRODUODENOSCOPY (EGD) WITH PROPOFOL    . EYE MUSCLE SURGERY Left   . PORTA CATH INSERTION N/A 10/19/2020   Procedure: PORTA CATH INSERTION;  Surgeon: Algernon Huxley, MD;  Location: [facility];  Service: Cardiovascular;  Laterality: N/A;  . TONSILLECTOMY AND ADENOIDECTOMY      Social History   Socioeconomic History  . Marital status: Married    Spouse name: Eloise  . Number of children: 4  . Years of education: Not on file  . Highest education level: Not on file  Occupational History  . Occupation: retired  Tobacco Use  . Smoking status: Never Smoker  . Smokeless tobacco: Never Used  Vaping Use  . Vaping Use: Never used  Substance and Sexual Activity  . Alcohol use: No  . Drug use: No  . Sexual activity: Yes    Partners: Female  Other Topics Concern  . Not on file  Social History Narrative   Lives with wife in a 5 story retirement home.  He lives on the first floor.  4 year college degree.  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     Social Determinants of Health   Financial Resource Strain: Low Risk   . Difficulty of Paying Living Expenses: Not hard at all  Food Insecurity: Not on file  Transportation Needs: No Transportation Needs  . Lack of Transportation (Medical): No  . Lack of Transportation (Non-Medical): No  Physical Activity: Not on file  Stress: No Stress Concern Present  . Feeling of Stress : Not at all  Social Connections: Unknown  . Frequency of Communication with Friends and Family: Not on file  . Frequency of Social Gatherings with Friends and Family: Not on file  . Attends Religious Services: Not on file  . Active Member of Clubs or Organizations: Not on file  . Attends Archivist Meetings: Not on  file  . Marital Status: Married  Human resources officer Violence: Not on file    Family History  Problem Relation Age of Onset  . Stroke Mother   . Arthritis Father   . Heart disease Father   . Brain cancer Brother        1/2 brother (possibly related to mother NOT related to patient)  . Colon cancer Neg Hx   . Esophageal cancer Neg Hx   . Pancreatic cancer Neg Hx   . Stomach cancer Neg Hx   . Liver disease Neg Hx      Current Outpatient Medications:  .  acetaminophen (TYLENOL) 325 MG tablet, Take 650 mg by mouth every 6 (six) hours as needed., Disp: , Rfl:  .  ALPRAZolam (XANAX) 0.25 MG tablet, Take 0.5 tablets (0.125 mg total) by mouth at bedtime as needed for anxiety or sleep., Disp: 20 tablet, Rfl: 0 .  Calcium Carb-Cholecalciferol (CALCIUM 1000 + D PO), Take 1 tablet by mouth daily. , Disp: , Rfl:  .  Cholecalciferol 50 MCG (2000 UT) CAPS, Take by mouth., Disp: , Rfl:  .  Denosumab (PROLIA [place]), Inject into the skin every 6 (six) months., Disp: , Rfl:  .  fluticasone (FLONASE) 50 MCG/ACT  nasal spray, Place 2 sprays into both nostrils daily., Disp: 16 g, Rfl: 6 .  glucosamine-chondroitin 500-400 MG tablet, Take 1 tablet by mouth daily., Disp: , Rfl:  .  lidocaine-prilocaine (EMLA) cream, Apply to affected area once, Disp: 30 g, Rfl: 3 .  lipase/protease/amylase (CREON) 36000 UNITS CPEP capsule, Take 2 capsule with the first bite of each meal and 1 capsule with the first bite of each snack, Disp: 240 capsule, Rfl: 2 .  loratadine (CLARITIN) 10 MG tablet, Take 10 mg by mouth daily as needed. , Disp: , Rfl:  .  LORazepam (ATIVAN) 0.5 MG tablet, Take 1 tablet (0.5 mg total) by mouth every 6 (six) hours as needed (Nausea or vomiting)., Disp: 30 tablet, Rfl: 0 .  pantoprazole (PROTONIX) 40 MG tablet, Take 1 tablet (40 mg total) by mouth daily., Disp: 30 tablet, Rfl: 0 .  pramoxine-hydrocortisone (PROCTOCREAM-HC) 1-1 % rectal cream, PLACE 1 APPLICATION RECTALLY 2 (TWO) TIMES DAILY., Disp: 30  g, Rfl: 0 .  prochlorperazine (COMPAZINE) 5 MG tablet, Take 5 mg by mouth every 6 (six) hours as needed for nausea or vomiting., Disp: , Rfl:  .  Ranibizumab (LUCENTIS IO), Inject into the eye as needed. As needed every three months., Disp: , Rfl:  .  traZODone (DESYREL) 50 MG tablet, TAKE 0.5 TABLETS (25 MG TOTAL) BY MOUTH AT BEDTIME. MAY INCREASE TO FULL TABLET IF NEEDED, Disp: 90 tablet, Rfl: 0 .  vitamin B-12 (CYANOCOBALAMIN) 1000 MCG tablet, Take 1,000 mcg by mouth in the morning and at bedtime., Disp: , Rfl:   Current Facility-Administered Medications:  .  gemcitabine (GEMZAR) 1,520 mg in sodium chloride 0.9 % 250 mL chemo infusion, 700 mg/m2 (Treatment Plan Recorded), Intravenous, Once, Sindy Guadeloupe, MD .  PACLitaxel-protein bound (ABRAXANE) chemo infusion 175 mg, 80 mg/m2 (Treatment Plan Recorded), Intravenous, Once, Sindy Guadeloupe, MD .  prochlorperazine (COMPAZINE) tablet 10 mg, 10 mg, Oral, Once, Sindy Guadeloupe, MD  Physical exam:  Vitals:   11/17/20 1403  BP: 119/78  Pulse: 77  Resp: 18  Temp: (!) 96.8 F (36 C)  TempSrc: Tympanic  SpO2: 100%  Weight: 185 lb 14.4 oz (84.3 kg)   Physical Exam Constitutional:      General: He is not in acute distress. Cardiovascular:     Rate and Rhythm: Normal rate and regular rhythm.     Heart sounds: Normal heart sounds.  Pulmonary:     Effort: Pulmonary effort is normal.     Breath sounds: Normal breath sounds.  Skin:    General: Skin is warm and dry.  Neurological:     Mental Status: He is alert and oriented to person, place, and time.      CMP Latest Ref Rng & Units 11/17/2020  Glucose 70 - 99 mg/dL 122(H)  BUN 8 - 23 mg/dL 29(H)  Creatinine 0.61 - 1.24 mg/dL 0.84  Sodium 135 - 145 mmol/L 137  Potassium 3.5 - 5.1 mmol/L 3.7  Chloride 98 - 111 mmol/L 102  CO2 22 - 32 mmol/L 25  Calcium 8.9 - 10.3 mg/dL 8.8(L)  Total Protein 6.5 - 8.1 g/dL 6.5  Total Bilirubin 0.3 - 1.2 mg/dL 0.8  Alkaline Phos 38 - 126 U/L 40  AST 15  - 41 U/L 33  ALT 0 - 44 U/L 34   CBC Latest Ref Rng & Units 11/17/2020  WBC 4.0 - 10.5 K/uL 5.3  Hemoglobin 13.0 - 17.0 g/dL 11.9(L)  Hematocrit 39.0 - 52.0 % 34.7(L)  Platelets  150 - 400 K/uL 145(L)    No images are attached to the encounter.  PERIPHERAL VASCULAR CATHETERIZATION  Result Date: 10/19/2020 See op note   Assessment and plan- Patient is a 87 y.o. 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    He is here for on treatment assessment prior to cycle 3-day 1 of gemcitabine Abraxane chemotherapy neoadjuvant.  Patient last received treatment last on 11/03/2020.  Abraxane was held secondary to low platelet counts.  They proceeded with single agent gemcitabine.  Treatments have been intermittently interrupted secondary to thrombocytopenia and neutropenia.  Plan was changed from 2 weeks on 1 week off to every 2 weeks.   Labs from 11/17/2020 show platelet count of 145,000 with a normal white blood cell count.  Hemoglobin is stable.  Electrolytes are WNL.  No evidence of leukopenia.  We will hold on pro-Neulasta with this cycle.  He is scheduled to return to clinic in 2 weeks for cycle 5 gemcitabine plus or minus Abraxane plus or minus on prior Neulasta.  Plan is to offer him about 3 months of chemotherapy with repeat scans in July 2022 and consideration for surgery versus radiation treatment at that time   Visit Diagnosis 1. Malignant neoplasm of body of pancreas ([place])    Greater than 50% was spent in counseling and coordination of care with this patient including but not limited to discussion of the relevant topics above (See A&P) including, but not limited to diagnosis and management of acute and chronic medical conditions.   Faythe Casa, NP 11/17/2020 3:12 PM

## 2020-11-17 NOTE — Progress Notes (Signed)
Per NP hold Neulasta today and proceed with gemzar and Abraxane.

## 2020-11-21 ENCOUNTER — Other Ambulatory Visit: Payer: Self-pay | Admitting: Internal Medicine

## 2020-11-23 ENCOUNTER — Other Ambulatory Visit: Payer: Self-pay | Admitting: Internal Medicine

## 2020-11-24 ENCOUNTER — Other Ambulatory Visit: Payer: Self-pay | Admitting: Internal Medicine

## 2020-11-24 NOTE — Telephone Encounter (Signed)
Alprazolam has been refilled  

## 2020-12-01 ENCOUNTER — Inpatient Hospital Stay: Payer: PPO | Attending: Oncology

## 2020-12-01 ENCOUNTER — Inpatient Hospital Stay: Payer: PPO

## 2020-12-01 ENCOUNTER — Encounter: Payer: Self-pay | Admitting: Oncology

## 2020-12-01 ENCOUNTER — Other Ambulatory Visit: Payer: Self-pay

## 2020-12-01 ENCOUNTER — Inpatient Hospital Stay (HOSPITAL_BASED_OUTPATIENT_CLINIC_OR_DEPARTMENT_OTHER): Payer: PPO | Admitting: Oncology

## 2020-12-01 VITALS — BP 108/69 | HR 73 | Temp 97.3°F | Ht 76.0 in | Wt 185.4 lb

## 2020-12-01 DIAGNOSIS — C251 Malignant neoplasm of body of pancreas: Secondary | ICD-10-CM

## 2020-12-01 DIAGNOSIS — T451X5A Adverse effect of antineoplastic and immunosuppressive drugs, initial encounter: Secondary | ICD-10-CM

## 2020-12-01 DIAGNOSIS — D6959 Other secondary thrombocytopenia: Secondary | ICD-10-CM

## 2020-12-01 DIAGNOSIS — D701 Agranulocytosis secondary to cancer chemotherapy: Secondary | ICD-10-CM | POA: Diagnosis not present

## 2020-12-01 DIAGNOSIS — Z5111 Encounter for antineoplastic chemotherapy: Secondary | ICD-10-CM | POA: Diagnosis not present

## 2020-12-01 LAB — CBC WITH DIFFERENTIAL/PLATELET
Abs Immature Granulocytes: 0.01 10*3/uL (ref 0.00–0.07)
Basophils Absolute: 0 10*3/uL (ref 0.0–0.1)
Basophils Relative: 1 %
Eosinophils Absolute: 0.1 10*3/uL (ref 0.0–0.5)
Eosinophils Relative: 2 %
HCT: 36 % — ABNORMAL LOW (ref 39.0–52.0)
Hemoglobin: 12.3 g/dL — ABNORMAL LOW (ref 13.0–17.0)
Immature Granulocytes: 0 %
Lymphocytes Relative: 26 %
Lymphs Abs: 1 10*3/uL (ref 0.7–4.0)
MCH: 31.5 pg (ref 26.0–34.0)
MCHC: 34.2 g/dL (ref 30.0–36.0)
MCV: 92.3 fL (ref 80.0–100.0)
Monocytes Absolute: 0.4 10*3/uL (ref 0.1–1.0)
Monocytes Relative: 10 %
Neutro Abs: 2.5 10*3/uL (ref 1.7–7.7)
Neutrophils Relative %: 61 %
Platelets: 118 10*3/uL — ABNORMAL LOW (ref 150–400)
RBC: 3.9 MIL/uL — ABNORMAL LOW (ref 4.22–5.81)
RDW: 14.9 % (ref 11.5–15.5)
WBC: 4 10*3/uL (ref 4.0–10.5)
nRBC: 0 % (ref 0.0–0.2)

## 2020-12-01 LAB — COMPREHENSIVE METABOLIC PANEL
ALT: 48 U/L — ABNORMAL HIGH (ref 0–44)
AST: 40 U/L (ref 15–41)
Albumin: 3.9 g/dL (ref 3.5–5.0)
Alkaline Phosphatase: 45 U/L (ref 38–126)
Anion gap: 9 (ref 5–15)
BUN: 25 mg/dL — ABNORMAL HIGH (ref 8–23)
CO2: 28 mmol/L (ref 22–32)
Calcium: 9.4 mg/dL (ref 8.9–10.3)
Chloride: 103 mmol/L (ref 98–111)
Creatinine, Ser: 1 mg/dL (ref 0.61–1.24)
GFR, Estimated: >60 mL/min (ref >60)
Glucose, Bld: 141 mg/dL — ABNORMAL HIGH (ref 70–99)
Potassium: 4 mmol/L (ref 3.5–5.1)
Sodium: 140 mmol/L (ref 135–145)
Total Bilirubin: 0.4 mg/dL (ref 0.3–1.2)
Total Protein: 6.6 g/dL (ref 6.5–8.1)

## 2020-12-01 MED ORDER — HEPARIN SOD (PORK) LOCK FLUSH 100 UNIT/ML IV SOLN
INTRAVENOUS | Status: AC
  Filled 2020-12-01: qty 5

## 2020-12-01 MED ORDER — SODIUM CHLORIDE 0.9 % IV SOLN
700.0000 mg/m2 | Freq: Once | INTRAVENOUS | Status: AC
  Administered 2020-12-01: 1520 mg via INTRAVENOUS
  Filled 2020-12-01: qty 26.3

## 2020-12-01 MED ORDER — PROCHLORPERAZINE MALEATE 10 MG PO TABS
10.0000 mg | ORAL_TABLET | Freq: Once | ORAL | Status: AC
Start: 2020-12-01 — End: 2020-12-01
  Administered 2020-12-01: 10 mg via ORAL
  Filled 2020-12-01: qty 1

## 2020-12-01 MED ORDER — PEGFILGRASTIM 6 MG/0.6ML ~~LOC~~ PSKT
6.0000 mg | PREFILLED_SYRINGE | Freq: Once | SUBCUTANEOUS | Status: AC
  Administered 2020-12-01: 6 mg via SUBCUTANEOUS
  Filled 2020-12-01: qty 0.6

## 2020-12-01 MED ORDER — SODIUM CHLORIDE 0.9 % IV SOLN
Freq: Once | INTRAVENOUS | Status: AC
  Filled 2020-12-01: qty 250

## 2020-12-01 MED ORDER — PACLITAXEL PROTEIN-BOUND CHEMO INJECTION 100 MG
80.0000 mg/m2 | Freq: Once | INTRAVENOUS | Status: AC
  Administered 2020-12-01: 175 mg via INTRAVENOUS
  Filled 2020-12-01: qty 35

## 2020-12-01 MED ORDER — HEPARIN SOD (PORK) LOCK FLUSH 100 UNIT/ML IV SOLN
500.0000 [IU] | Freq: Once | INTRAVENOUS | Status: AC | PRN
  Administered 2020-12-01: 500 [IU]
  Filled 2020-12-01: qty 5

## 2020-12-01 NOTE — Patient Instructions (Signed)
[facility] ONCOLOGY  Discharge Instructions: Thank you for choosing [facility] to provide your oncology and hematology care.  If you have a lab appointment with the [facility], please go directly to the [facility] and check in at the registration area.  Wear comfortable clothing and clothing appropriate for easy access to any Portacath or PICC line.   We strive to give you quality time with your provider. You may need to reschedule your appointment if you arrive late (15 or more minutes).  Arriving late affects you and other patients whose appointments are after yours.  Also, if you miss three or more appointments without notifying the office, you may be dismissed from the clinic at the provider's discretion.      For prescription refill requests, have your pharmacy contact our office and allow 72 hours for refills to be completed.    Today you received the following chemotherapy and/or immunotherapy agents Gemzar & Abraxane      To help prevent nausea and vomiting after your treatment, we encourage you to take your nausea medication as directed.  BELOW ARE SYMPTOMS THAT SHOULD BE REPORTED IMMEDIATELY: *FEVER GREATER THAN 100.4 F (38 C) OR HIGHER *CHILLS OR SWEATING *NAUSEA AND VOMITING THAT IS NOT CONTROLLED WITH YOUR NAUSEA MEDICATION *UNUSUAL SHORTNESS OF BREATH *UNUSUAL BRUISING OR BLEEDING *URINARY PROBLEMS (pain or burning when urinating, or frequent urination) *BOWEL PROBLEMS (unusual diarrhea, constipation, pain near the anus) TENDERNESS IN MOUTH AND THROAT WITH OR WITHOUT PRESENCE OF ULCERS (sore throat, sores in mouth, or a toothache) UNUSUAL RASH, SWELLING OR PAIN  UNUSUAL VAGINAL DISCHARGE OR ITCHING   Items with * indicate a potential emergency and should be followed up as soon as possible or go to the Emergency Department if any problems should occur.  Please show the CHEMOTHERAPY ALERT CARD or IMMUNOTHERAPY ALERT CARD at  check-in to the Emergency Department and triage nurse.  Should you have questions after your visit or need to cancel or reschedule your appointment, please contact [facility]  [phone] and follow the prompts.  Office hours are 8:00 a.m. to 4:30 p.m. Monday - Friday. Please note that voicemails left after 4:00 p.m. may not be returned until the following business day.  We are closed weekends and major holidays. You have access to a nurse at all times for urgent questions. Please call the main number to the clinic [phone] and follow the prompts.  For any non-urgent questions, you may also contact your provider using MyChart. We now offer e-Visits for anyone 56 and older to request care online for non-urgent symptoms. For details visit [email or web address].   Also download the MyChart app! Go to the app store, search "MyChart", open the app, select [facility], and log in with your MyChart username and password.  Due to Covid, a mask is required upon entering the hospital/clinic. If you do not have a mask, one will be given to you upon arrival. For doctor visits, patients may have 1 support person aged 41 or older with them. For treatment visits, patients cannot have anyone with them due to current Covid guidelines and our immunocompromised population.

## 2020-12-01 NOTE — Addendum Note (Signed)
Addended by: Luella Cook on: 12/01/2020 10:54 PM   Modules accepted: Orders

## 2020-12-01 NOTE — Progress Notes (Signed)
Hematology/Oncology Consult note [facility]  Telephone:([phone] [phone]  Patient Care Team: Crecencio Mc, MD as PCP - General (Internal Medicine) Clent Jacks, RN as Oncology Nurse Navigator   Name of the patient: George Owens  [id number]  1932-02-02   Date of visit: 12/01/20  Diagnosis- pancreatic adenocarcinoma stage I BC T2N 0M0  Chief complaint/ Reason for visit-on treatment assessment prior to cycle 3-day 1 of gemcitabine Abraxane chemotherapy  Heme/Onc history: patient is a 87 year old male with a past medical history significant for GERD, hyperlipidemia, cirrhosis.  He was having symptoms of Abdominal pain and reflux and therefore underwent a CT abdomen and pelvis with contrast on 09/11/2020 which showed a hypointense ill-defined mass in the pancreatic body up to 25 mm.  No extrapancreatic infiltrative density about the aorta or proximal [name].  No local regional adenopathy or distant metastatic disease.  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  This was followed by an EUS at [facility] which showed a hypoechoic mass measuring 1.8 x 1.6 cm in the pancreatic body.  Endosonographic borders poorly defined.  Upstream pancreatic duct dilatation.  No abnormal appearing lymph nodes.  Endosonographic imaging of the liver showed no lesion.  Biopsy showed adenocarcinoma.   PET scan showed 2.7 x 2 cm ovoid mass in the body of the pancreas with an SUV of 7.1.  18 mm hypermetabolic nodule in the isthmus of the thyroid gland.  No evidence of local regional adenopathy or distant metastatic disease.   Patient was evaluated by Dr. Hyman Hopes at [facility] for consideration of surgery and has been deemed to be upfront resectable surgical candidate.  However plan was to offer him neoadjuvant chemotherapy for 3 months to control micrometastatic disease before proceeding with definitive surgery.  If ultimately patient decides not to proceed with surgery then radiation at  [facility] also remains an option.  Patient is currently getting gemcitabine Abraxane chemotherapy 1 week on 1 week of due to neutropenia and thrombocytopenia  Interval history-patient reports tolerating chemotherapy well with the week on week off regimen.  He has not had any significant nausea or vomiting.  He does have some symptoms of indigestion 3 to 4 days after chemotherapy and feels that the Creon is helping him.  He is also on Protonix.  ECOG PS- 1 Pain scale- 0 Opioid associated constipation- no  Review of systems- Review of Systems  Constitutional:  Positive for malaise/fatigue. Negative for chills, fever and weight loss.  HENT:  Negative for congestion, ear discharge and nosebleeds.   Eyes:  Negative for blurred vision.  Respiratory:  Negative for cough, hemoptysis, sputum production, shortness of breath and wheezing.   Cardiovascular:  Negative for chest pain, palpitations, orthopnea and claudication.  Gastrointestinal:  Negative for abdominal pain, blood in stool, constipation, diarrhea, heartburn, melena, nausea and vomiting.  Genitourinary:  Negative for dysuria, flank pain, frequency, hematuria and urgency.  Musculoskeletal:  Negative for back pain, joint pain and myalgias.  Skin:  Negative for rash.  Neurological:  Negative for dizziness, tingling, focal weakness, seizures, weakness and headaches.  Endo/Heme/Allergies:  Does not bruise/bleed easily.  Psychiatric/Behavioral:  Negative for depression and suicidal ideas. The patient does not have insomnia.      Allergies  Allergen Reactions   Kenalog [Triamcinolone Acetonide]     Blindness X 3 Days     Past Medical History:  Diagnosis Date   Allergy    Aneurysm of abdominal aorta (HCC)    Basal cell carcinoma  right temple Dr. Evorn Gong 12/2018   Chicken pox    Colon polyps    COVID-19 virus infection 03/16/2020   GERD (gastroesophageal reflux disease)    history of prostate CA 2006   Hyperlipidemia    Leg cramps     Neuropathy    Osteoporosis      Past Surgical History:  Procedure Laterality Date   CATARACT EXTRACTION Right    CATARACT EXTRACTION W/PHACO Left 12/26/2017   Procedure: CATARACT EXTRACTION PHACO AND INTRAOCULAR LENS PLACEMENT ([place])  COMPLICATED LEFT;  Surgeon: Leandrew Koyanagi, MD;  Location: [place];  Service: Ophthalmology;  Laterality: Left;  [place]   colonoscopy     ESOPHAGOGASTRODUODENOSCOPY (EGD) WITH PROPOFOL     EYE MUSCLE SURGERY Left    PORTA CATH INSERTION N/A 10/19/2020   Procedure: PORTA CATH INSERTION;  Surgeon: Algernon Huxley, MD;  Location: [facility];  Service: Cardiovascular;  Laterality: N/A;   TONSILLECTOMY AND ADENOIDECTOMY      Social History   Socioeconomic History   Marital status: Married    Spouse name: Eloise   Number of children: 4   Years of education: Not on file   Highest education level: Not on file  Occupational History   Occupation: retired  Tobacco Use   Smoking status: Never   Smokeless tobacco: Never  Vaping Use   Vaping Use: Never used  Substance and Sexual Activity   Alcohol use: No   Drug use: No   Sexual activity: Yes    Partners: Female  Other Topics Concern   Not on file  Social History Narrative   Lives with wife in a 5 story retirement home.  He lives on the first floor.  4 year college degree.  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     Social Determinants of Health   Financial Resource Strain: Low Risk    Difficulty of Paying Living Expenses: Not hard at all  Food Insecurity: Not on file  Transportation Needs: No Transportation Needs   Lack of Transportation (Medical): No   Lack of Transportation (Non-Medical): No  Physical Activity: Not on file  Stress: No Stress Concern Present   Feeling of Stress : Not at all  Social Connections: Unknown   Frequency of Communication with Friends and Family: Not on file   Frequency of Social Gatherings with Friends  and Family: Not on file   Attends Religious Services: Not on file   Active Member of Clubs or Organizations: Not on file   Attends Archivist Meetings: Not on file   Marital Status: Married  Human resources officer Violence: Not on file    Family History  Problem Relation Age of Onset   Stroke Mother    Arthritis Father    Heart disease Father    Brain cancer Brother        1/2 brother (possibly related to mother NOT related to patient)   Colon cancer Neg Hx    Esophageal cancer Neg Hx    Pancreatic cancer Neg Hx    Stomach cancer Neg Hx    Liver disease Neg Hx      Current Outpatient Medications:    acetaminophen (TYLENOL) 325 MG tablet, Take 650 mg by mouth every 6 (six) hours as needed., Disp: , Rfl:    ALPRAZolam (XANAX) 0.25 MG tablet, TAKE 0.5 TABLETS (0.125 MG TOTAL) BY MOUTH AT BEDTIME AS NEEDED FOR ANXIETY OR SLEEP., Disp: 20 tablet, Rfl: 5  Calcium Carb-Cholecalciferol (CALCIUM 1000 + D PO), Take 1 tablet by mouth daily. , Disp: , Rfl:    Cholecalciferol 50 MCG (2000 UT) CAPS, Take by mouth., Disp: , Rfl:    Denosumab (PROLIA [place]), Inject into the skin every 6 (six) months., Disp: , Rfl:    fluticasone (FLONASE) 50 MCG/ACT nasal spray, Place 2 sprays into both nostrils daily., Disp: 16 g, Rfl: 6   glucosamine-chondroitin 500-400 MG tablet, Take 1 tablet by mouth daily., Disp: , Rfl:    lidocaine-prilocaine (EMLA) cream, Apply to affected area once, Disp: 30 g, Rfl: 3   lipase/protease/amylase (CREON) 36000 UNITS CPEP capsule, Take 2 capsule with the first bite of each meal and 1 capsule with the first bite of each snack, Disp: 240 capsule, Rfl: 2   loratadine (CLARITIN) 10 MG tablet, Take 10 mg by mouth daily as needed. , Disp: , Rfl:    LORazepam (ATIVAN) 0.5 MG tablet, Take 1 tablet (0.5 mg total) by mouth every 6 (six) hours as needed (Nausea or vomiting)., Disp: 30 tablet, Rfl: 0   pantoprazole (PROTONIX) 40 MG tablet, Take 1 tablet (40 mg total) by mouth daily.,  Disp: 30 tablet, Rfl: 0   pramoxine-hydrocortisone (PROCTOCREAM-HC) 1-1 % rectal cream, PLACE 1 APPLICATION RECTALLY 2 (TWO) TIMES DAILY., Disp: 30 g, Rfl: 0   prochlorperazine (COMPAZINE) 5 MG tablet, Take 5 mg by mouth every 6 (six) hours as needed for nausea or vomiting., Disp: , Rfl:    Ranibizumab (LUCENTIS IO), Inject into the eye as needed. As needed every three months., Disp: , Rfl:    traZODone (DESYREL) 50 MG tablet, TAKE 0.5 TABLETS (25 MG TOTAL) BY MOUTH AT BEDTIME. MAY INCREASE TO FULL TABLET IF NEEDED, Disp: 90 tablet, Rfl: 0   vitamin B-12 (CYANOCOBALAMIN) 1000 MCG tablet, Take 1,000 mcg by mouth in the morning and at bedtime., Disp: , Rfl:  No current facility-administered medications for this visit.  Facility-Administered Medications Ordered in Other Visits:    gemcitabine (GEMZAR) 1,520 mg in sodium chloride 0.9 % 250 mL chemo infusion, 700 mg/m2 (Treatment Plan Recorded), Intravenous, Once, Sindy Guadeloupe, MD, Last Rate: 580 mL/hr at 12/01/20 1134, 1,520 mg at 12/01/20 1134   heparin lock flush 100 unit/mL, 500 Units, Intracatheter, Once PRN, Sindy Guadeloupe, MD  Physical exam:  Vitals:   12/01/20 0851  BP: 108/69  Pulse: 73  Temp: (!) 97.3 F (36.3 C)  SpO2: 99%  Weight: 185 lb 7.2 oz (84.1 kg)  Height: 6\' 4"  (1.93 m)   Physical Exam Cardiovascular:     Rate and Rhythm: Normal rate and regular rhythm.     Heart sounds: Normal heart sounds.  Pulmonary:     Effort: Pulmonary effort is normal.     Breath sounds: Normal breath sounds.  Skin:    General: Skin is warm and dry.  Neurological:     Mental Status: He is alert and oriented to person, place, and time.     CMP Latest Ref Rng & Units 12/01/2020  Glucose 70 - 99 mg/dL 141(H)  BUN 8 - 23 mg/dL 25(H)  Creatinine 0.61 - 1.24 mg/dL 1.00  Sodium 135 - 145 mmol/L 140  Potassium 3.5 - 5.1 mmol/L 4.0  Chloride 98 - 111 mmol/L 103  CO2 22 - 32 mmol/L 28  Calcium 8.9 - 10.3 mg/dL 9.4  Total Protein 6.5 - 8.1  g/dL 6.6  Total Bilirubin 0.3 - 1.2 mg/dL 0.4  Alkaline Phos 38 - 126 U/L 45  AST 15 - 41 U/L 40  ALT 0 - 44 U/L 48(H)   CBC Latest Ref Rng & Units 12/01/2020  WBC 4.0 - 10.5 K/uL 4.0  Hemoglobin 13.0 - 17.0 g/dL 12.3(L)  Hematocrit 39.0 - 52.0 % 36.0(L)  Platelets 150 - 400 K/uL 118(L)     Assessment and plan- Patient is a 87 y.o. 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  He is here for on treatment assessment prior to cycle 3-day 1 of gemcitabine Abraxane chemotherapy  Counts okay to proceed with cycle 3-day 1 of gemcitabine Abraxane chemotherapy today.  White count is 4 with an ANC of 2.5.  He will receive on pro Neulasta with this cycle.  He will see covering NP in 2 weeks for cycle 3-day 15 and I will see him back in 4 weeks for cycle 4-day 1.  He has an upcoming appointment with Dr. Hyman Hopes at [facility] in August.  He will likely get scans done at [facility] as well.  Chemo induced thrombocytopenia: Okay to proceed with chemotherapy as long as platelet counts are more than 100.  Chemo induced neutropenia: He will receive 1 for Neulasta today.  He will likely require Neulasta every other cycle     Visit Diagnosis 1. Encounter for antineoplastic chemotherapy   2. Chemotherapy-induced thrombocytopenia   3. Chemotherapy induced neutropenia (HCC)   4. Malignant neoplasm of body of pancreas ([place])      Dr. Randa Evens, MD, MPH [facility] [phone] 12/01/2020 11:58 AM

## 2020-12-02 ENCOUNTER — Telehealth: Payer: Self-pay

## 2020-12-02 NOTE — Telephone Encounter (Signed)
Pt called and states that he wants Dr Derrel Nip to give him the ok to get the Prolia injection.

## 2020-12-02 NOTE — Telephone Encounter (Signed)
Does pt need to schedule an appt to discuss?

## 2020-12-03 NOTE — Telephone Encounter (Signed)
LMTCB. Need to schedule pt for a Prolia injection.

## 2020-12-07 ENCOUNTER — Other Ambulatory Visit: Payer: Self-pay | Admitting: Oncology

## 2020-12-07 ENCOUNTER — Encounter: Payer: Self-pay | Admitting: Oncology

## 2020-12-07 ENCOUNTER — Other Ambulatory Visit (HOSPITAL_COMMUNITY): Payer: Self-pay

## 2020-12-08 ENCOUNTER — Other Ambulatory Visit: Payer: Self-pay

## 2020-12-08 ENCOUNTER — Ambulatory Visit (INDEPENDENT_AMBULATORY_CARE_PROVIDER_SITE_OTHER): Payer: PPO

## 2020-12-08 ENCOUNTER — Telehealth: Payer: Self-pay | Admitting: Gastroenterology

## 2020-12-08 DIAGNOSIS — M810 Age-related osteoporosis without current pathological fracture: Secondary | ICD-10-CM

## 2020-12-08 MED ORDER — DENOSUMAB 60 MG/ML ~~LOC~~ SOSY
60.0000 mg | PREFILLED_SYRINGE | Freq: Once | SUBCUTANEOUS | Status: AC
  Administered 2020-12-08: 60 mg via SUBCUTANEOUS

## 2020-12-08 NOTE — Telephone Encounter (Signed)
Patient states he is in the doughnut whole with his insurance and the creon now is 500 dollars. Informed patient we could try patient assistance to help him with his medication  He is going to go to the [employer]  and fill out his portion of the form. He will then bring the form by our office and we will fill it out and fax it in. Informed patient to bring last W2 form also

## 2020-12-08 NOTE — Telephone Encounter (Signed)
Pt is scheduled °

## 2020-12-08 NOTE — Telephone Encounter (Signed)
Please call patient. He is asking for Creon samples or another alternative drug due to expense

## 2020-12-08 NOTE — Progress Notes (Signed)
Patient presented for prolia injection to left arm, patient voiced no concerns nor showed any signs of distress during injection    

## 2020-12-09 NOTE — Telephone Encounter (Signed)
Patient talk to [place] about patient assistance. They state they think patient would qualify. He is going to bring his portion of the form to me today. I have given him 5 boxes of samples so he does not have to pay that high copay. Patient was very appreciative of the samples that is being given to him

## 2020-12-15 ENCOUNTER — Inpatient Hospital Stay: Payer: PPO

## 2020-12-15 ENCOUNTER — Inpatient Hospital Stay (HOSPITAL_BASED_OUTPATIENT_CLINIC_OR_DEPARTMENT_OTHER): Payer: PPO | Admitting: Nurse Practitioner

## 2020-12-15 ENCOUNTER — Other Ambulatory Visit: Payer: Self-pay

## 2020-12-15 ENCOUNTER — Encounter: Payer: Self-pay | Admitting: Nurse Practitioner

## 2020-12-15 VITALS — BP 148/68 | HR 64 | Resp 18

## 2020-12-15 VITALS — BP 109/65 | HR 77 | Temp 96.8°F | Resp 20 | Wt 184.6 lb

## 2020-12-15 DIAGNOSIS — D701 Agranulocytosis secondary to cancer chemotherapy: Secondary | ICD-10-CM | POA: Diagnosis not present

## 2020-12-15 DIAGNOSIS — D6959 Other secondary thrombocytopenia: Secondary | ICD-10-CM | POA: Diagnosis not present

## 2020-12-15 DIAGNOSIS — Z5111 Encounter for antineoplastic chemotherapy: Secondary | ICD-10-CM

## 2020-12-15 DIAGNOSIS — T451X5A Adverse effect of antineoplastic and immunosuppressive drugs, initial encounter: Secondary | ICD-10-CM

## 2020-12-15 DIAGNOSIS — C251 Malignant neoplasm of body of pancreas: Secondary | ICD-10-CM

## 2020-12-15 DIAGNOSIS — Z95828 Presence of other vascular implants and grafts: Secondary | ICD-10-CM

## 2020-12-15 LAB — COMPREHENSIVE METABOLIC PANEL
ALT: 43 U/L (ref 0–44)
AST: 38 U/L (ref 15–41)
Albumin: 4 g/dL (ref 3.5–5.0)
Alkaline Phosphatase: 77 U/L (ref 38–126)
Anion gap: 8 (ref 5–15)
BUN: 24 mg/dL — ABNORMAL HIGH (ref 8–23)
CO2: 26 mmol/L (ref 22–32)
Calcium: 8.9 mg/dL (ref 8.9–10.3)
Chloride: 103 mmol/L (ref 98–111)
Creatinine, Ser: 1 mg/dL (ref 0.61–1.24)
GFR, Estimated: >60 mL/min (ref >60)
Glucose, Bld: 137 mg/dL — ABNORMAL HIGH (ref 70–99)
Potassium: 3.8 mmol/L (ref 3.5–5.1)
Sodium: 137 mmol/L (ref 135–145)
Total Bilirubin: 0.6 mg/dL (ref 0.3–1.2)
Total Protein: 6.3 g/dL — ABNORMAL LOW (ref 6.5–8.1)

## 2020-12-15 LAB — CBC WITH DIFFERENTIAL/PLATELET
Abs Immature Granulocytes: 0.22 10*3/uL — ABNORMAL HIGH (ref 0.00–0.07)
Basophils Absolute: 0.1 10*3/uL (ref 0.0–0.1)
Basophils Relative: 1 %
Eosinophils Absolute: 0.1 10*3/uL (ref 0.0–0.5)
Eosinophils Relative: 1 %
HCT: 36.3 % — ABNORMAL LOW (ref 39.0–52.0)
Hemoglobin: 12.3 g/dL — ABNORMAL LOW (ref 13.0–17.0)
Immature Granulocytes: 2 %
Lymphocytes Relative: 15 %
Lymphs Abs: 1.5 10*3/uL (ref 0.7–4.0)
MCH: 32.4 pg (ref 26.0–34.0)
MCHC: 33.9 g/dL (ref 30.0–36.0)
MCV: 95.5 fL (ref 80.0–100.0)
Monocytes Absolute: 0.8 10*3/uL (ref 0.1–1.0)
Monocytes Relative: 8 %
Neutro Abs: 7.4 10*3/uL (ref 1.7–7.7)
Neutrophils Relative %: 73 %
Platelets: 107 10*3/uL — ABNORMAL LOW (ref 150–400)
RBC: 3.8 MIL/uL — ABNORMAL LOW (ref 4.22–5.81)
RDW: 15.9 % — ABNORMAL HIGH (ref 11.5–15.5)
WBC: 10.1 10*3/uL (ref 4.0–10.5)
nRBC: 0 % (ref 0.0–0.2)

## 2020-12-15 MED ORDER — HEPARIN SOD (PORK) LOCK FLUSH 100 UNIT/ML IV SOLN
INTRAVENOUS | Status: AC
  Filled 2020-12-15: qty 5

## 2020-12-15 MED ORDER — PACLITAXEL PROTEIN-BOUND CHEMO INJECTION 100 MG
80.0000 mg/m2 | Freq: Once | INTRAVENOUS | Status: AC
  Administered 2020-12-15: 175 mg via INTRAVENOUS
  Filled 2020-12-15: qty 35

## 2020-12-15 MED ORDER — HEPARIN SOD (PORK) LOCK FLUSH 100 UNIT/ML IV SOLN
500.0000 [IU] | Freq: Once | INTRAVENOUS | Status: DC
  Filled 2020-12-15: qty 5

## 2020-12-15 MED ORDER — PROCHLORPERAZINE MALEATE 10 MG PO TABS
10.0000 mg | ORAL_TABLET | Freq: Once | ORAL | Status: AC
  Administered 2020-12-15: 10 mg via ORAL
  Filled 2020-12-15: qty 1

## 2020-12-15 MED ORDER — SODIUM CHLORIDE 0.9 % IV SOLN
700.0000 mg/m2 | Freq: Once | INTRAVENOUS | Status: AC
  Administered 2020-12-15: 1520 mg via INTRAVENOUS
  Filled 2020-12-15: qty 26.3

## 2020-12-15 MED ORDER — HEPARIN SOD (PORK) LOCK FLUSH 100 UNIT/ML IV SOLN
500.0000 [IU] | Freq: Once | INTRAVENOUS | Status: AC | PRN
  Administered 2020-12-15: 500 [IU]
  Filled 2020-12-15: qty 5

## 2020-12-15 MED ORDER — PEGFILGRASTIM 6 MG/0.6ML ~~LOC~~ PSKT
6.0000 mg | PREFILLED_SYRINGE | Freq: Once | SUBCUTANEOUS | Status: DC
  Filled 2020-12-15: qty 0.6

## 2020-12-15 MED ORDER — SODIUM CHLORIDE 0.9 % IV SOLN
Freq: Once | INTRAVENOUS | Status: AC
Start: 2020-12-15 — End: 2020-12-15
  Filled 2020-12-15: qty 250

## 2020-12-15 MED ORDER — SODIUM CHLORIDE 0.9% FLUSH
10.0000 mL | Freq: Once | INTRAVENOUS | Status: AC
Start: 2020-12-15 — End: 2020-12-15
  Administered 2020-12-15: 10 mL via INTRAVENOUS
  Filled 2020-12-15: qty 10

## 2020-12-15 NOTE — Patient Instructions (Signed)
[facility]  Discharge Instructions: Thank you for choosing [facility] to provide your oncology and hematology care.  If you have a lab appointment with the [facility], please go directly to the [facility] and check in at the registration area.  Wear comfortable clothing and clothing appropriate for easy access to any Portacath or PICC line.   We strive to give you quality time with your provider. You may need to reschedule your appointment if you arrive late (15 or more minutes).  Arriving late affects you and other patients whose appointments are after yours.  Also, if you miss three or more appointments without notifying the office, you may be dismissed from the clinic at the provider's discretion.      For prescription refill requests, have your pharmacy contact our office and allow 72 hours for refills to be completed.    Today you received the following chemotherapy and/or immunotherapy agents ABRAXENE, GEMZAR, ON PRO      To help prevent nausea and vomiting after your treatment, we encourage you to take your nausea medication as directed.  BELOW ARE SYMPTOMS THAT SHOULD BE REPORTED IMMEDIATELY: *FEVER GREATER THAN 100.4 F (38 C) OR HIGHER *CHILLS OR SWEATING *NAUSEA AND VOMITING THAT IS NOT CONTROLLED WITH YOUR NAUSEA MEDICATION *UNUSUAL SHORTNESS OF BREATH *UNUSUAL BRUISING OR BLEEDING *URINARY PROBLEMS (pain or burning when urinating, or frequent urination) *BOWEL PROBLEMS (unusual diarrhea, constipation, pain near the anus) TENDERNESS IN MOUTH AND THROAT WITH OR WITHOUT PRESENCE OF ULCERS (sore throat, sores in mouth, or a toothache) UNUSUAL RASH, SWELLING OR PAIN  UNUSUAL VAGINAL DISCHARGE OR ITCHING   Items with * indicate a potential emergency and should be followed up as soon as possible or go to the Emergency Department if any problems should occur.  Please show the CHEMOTHERAPY ALERT CARD or IMMUNOTHERAPY ALERT  CARD at check-in to the Emergency Department and triage nurse.  Should you have questions after your visit or need to cancel or reschedule your appointment, please contact [facility]  [phone] and follow the prompts.  Office hours are 8:00 a.m. to 4:30 p.m. Monday - Friday. Please note that voicemails left after 4:00 p.m. may not be returned until the following business day.  We are closed weekends and major holidays. You have access to a nurse at all times for urgent questions. Please call the main number to the clinic [phone] and follow the prompts.  For any non-urgent questions, you may also contact your provider using MyChart. We now offer e-Visits for anyone 28 and older to request care online for non-urgent symptoms. For details visit [email or web address].   Also download the MyChart app! Go to the app store, search "MyChart", open the app, select [facility], and log in with your MyChart username and password.  Due to Covid, a mask is required upon entering the hospital/clinic. If you do not have a mask, one will be given to you upon arrival. For doctor visits, patients may have 1 support person aged 57 or older with them. For treatment visits, patients cannot have anyone with them due to current Covid guidelines and our immunocompromised population.   Nanoparticle Albumin-Bound Paclitaxel injection What is this medication? NANOPARTICLE ALBUMIN-BOUND PACLITAXEL (Na no PAHR ti kuhl al BYOO muhn-bound PAK li TAX el) is a chemotherapy drug. It targets fast dividing cells, like cancer cells, and causes these cells to die. This medicine is used to treatadvanced breast cancer, lung cancer, and  pancreatic cancer. This medicine may be used for other purposes; ask your health care provider orpharmacist if you have questions. COMMON BRAND NAME(S): Abraxane What should I tell my care team before I take this medication? They need to know if you have any of  these conditions: kidney disease liver disease low blood counts, like low white cell, platelet, or red cell counts lung or breathing disease, like asthma tingling of the fingers or toes, or other nerve disorder an unusual or allergic reaction to paclitaxel, albumin, other chemotherapy, other medicines, foods, dyes, or preservatives pregnant or trying to get pregnant breast-feeding How should I use this medication? This drug is given as an infusion into a vein. It is administered in a hospitalor clinic by a specially trained health care professional. Talk to your pediatrician regarding the use of this medicine in children.Special care may be needed. Overdosage: If you think you have taken too much of this medicine contact apoison control center or emergency room at once. NOTE: This medicine is only for you. Do not share this medicine with others. What if I miss a dose? It is important not to miss your dose. Call your doctor or health careprofessional if you are unable to keep an appointment. What may interact with this medication? This medicine may interact with the following medications: antiviral medicines for hepatitis, HIV or AIDS certain antibiotics like erythromycin and clarithromycin certain medicines for fungal infections like ketoconazole and itraconazole certain medicines for seizures like carbamazepine, phenobarbital, phenytoin gemfibrozil nefazodone rifampin [place]'s wort This list may not describe all possible interactions. Give your health care provider a list of all the medicines, herbs, non-prescription drugs, or dietary supplements you use. Also tell them if you smoke, drink alcohol, or use illegaldrugs. Some items may interact with your medicine. What should I watch for while using this medication? Your condition will be monitored carefully while you are receiving this medicine. You will need important blood work done while you are taking thismedicine. This medicine  can cause serious allergic reactions. If you experience allergic reactions like skin rash, itching or hives, swelling of the face, lips, ortongue, tell your doctor or health care professional right away. In some cases, you may be given additional medicines to help with side effects.Follow all directions for their use. This drug may make you feel generally unwell. This is not uncommon, as chemotherapy can affect healthy cells as well as cancer cells. Report any side effects. Continue your course of treatment even though you feel ill unless yourdoctor tells you to stop. Call your doctor or health care professional for advice if you get a fever, chills or sore throat, or other symptoms of a cold or flu. Do not treat yourself. This drug decreases your body's ability to fight infections. Try toavoid being around people who are sick. This medicine may increase your risk to bruise or bleed. Call your doctor orhealth care professional if you notice any unusual bleeding. Be careful brushing and flossing your teeth or using a toothpick because you may get an infection or bleed more easily. If you have any dental work done,tell your dentist you are receiving this medicine. Avoid taking products that contain aspirin, acetaminophen, ibuprofen, naproxen, or ketoprofen unless instructed by your doctor. These medicines may hide afever. Do not become pregnant while taking this medicine or for 6 months after stopping it. Women should inform their doctor if they wish to become pregnant or think they might be pregnant. Men should not father a child while  taking this medicine or for 3 months after stopping it. There is a potential for serious side effects to an unborn child. Talk to your health care professionalor pharmacist for more information. Do not breast-feed an infant while taking this medicine or for 2 weeks afterstopping it. This medicine may interfere with the ability to get pregnant or to father a child. You should  talk to your doctor or health care professional if you areconcerned about your fertility. What side effects may I notice from receiving this medication? Side effects that you should report to your doctor or health care professionalas soon as possible: allergic reactions like skin rash, itching or hives, swelling of the face, lips, or tongue breathing problems changes in vision fast, irregular heartbeat low blood pressure mouth sores pain, tingling, numbness in the hands or feet signs of decreased platelets or bleeding - bruising, pinpoint red spots on the skin, black, tarry stools, blood in the urine signs of decreased red blood cells - unusually weak or tired, feeling faint or lightheaded, falls signs of infection - fever or chills, cough, sore throat, pain or difficulty passing urine signs and symptoms of liver injury like dark yellow or brown urine; general ill feeling or flu-like symptoms; light-colored stools; loss of appetite; nausea; right upper belly pain; unusually weak or tired; yellowing of the eyes or skin swelling of the ankles, feet, hands unusually slow heartbeat Side effects that usually do not require medical attention (report to yourdoctor or health care professional if they continue or are bothersome): diarrhea hair loss loss of appetite nausea, vomiting tiredness This list may not describe all possible side effects. Call your doctor for medical advice about side effects. You may report side effects to FDA at1-800-FDA-1088. Where should I keep my medication? This drug is given in a hospital or clinic and will not be stored at home. NOTE: This sheet is a summary. It may not cover all possible information. If you have questions about this medicine, talk to your doctor, pharmacist, orhealth care provider.  2022 Elsevier/Gold Standard (2017-02-07 13:03:45)  Gemcitabine injection What is this medication? GEMCITABINE (jem SYE ta been) is a chemotherapy drug. This medicine  is used to treat many types of cancer like breast cancer, lung cancer, pancreatic cancer,and ovarian cancer. This medicine may be used for other purposes; ask your health care provider orpharmacist if you have questions. COMMON BRAND NAME(S): Gemzar, Infugem What should I tell my care team before I take this medication? They need to know if you have any of these conditions: blood disorders infection kidney disease liver disease lung or breathing disease, like asthma recent or ongoing radiation therapy an unusual or allergic reaction to gemcitabine, other chemotherapy, other medicines, foods, dyes, or preservatives pregnant or trying to get pregnant breast-feeding How should I use this medication? This drug is given as an infusion into a vein. It is administered in a hospitalor clinic by a specially trained health care professional. Talk to your pediatrician regarding the use of this medicine in children.Special care may be needed. Overdosage: If you think you have taken too much of this medicine contact apoison control center or emergency room at once. NOTE: This medicine is only for you. Do not share this medicine with others. What if I miss a dose? It is important not to miss your dose. Call your doctor or health careprofessional if you are unable to keep an appointment. What may interact with this medication? medicines to increase blood counts like filgrastim, pegfilgrastim, sargramostim some  other chemotherapy drugs like cisplatin vaccines Talk to your doctor or health care professional before taking any of thesemedicines: acetaminophen aspirin ibuprofen ketoprofen naproxen This list may not describe all possible interactions. Give your health care provider a list of all the medicines, herbs, non-prescription drugs, or dietary supplements you use. Also tell them if you smoke, drink alcohol, or use illegaldrugs. Some items may interact with your medicine. What should I watch for  while using this medication? Visit your doctor for checks on your progress. This drug may make you feel generally unwell. This is not uncommon, as chemotherapy can affect healthy cells as well as cancer cells. Report any side effects. Continue your course oftreatment even though you feel ill unless your doctor tells you to stop. In some cases, you may be given additional medicines to help with side effects.Follow all directions for their use. Call your doctor or health care professional for advice if you get a fever, chills or sore throat, or other symptoms of a cold or flu. Do not treat yourself. This drug decreases your body's ability to fight infections. Try toavoid being around people who are sick. This medicine may increase your risk to bruise or bleed. Call your doctor orhealth care professional if you notice any unusual bleeding. Be careful brushing and flossing your teeth or using a toothpick because you may get an infection or bleed more easily. If you have any dental work done,tell your dentist you are receiving this medicine. Avoid taking products that contain aspirin, acetaminophen, ibuprofen, naproxen, or ketoprofen unless instructed by your doctor. These medicines may hide afever. Do not become pregnant while taking this medicine or for 6 months after stopping it. Women should inform their doctor if they wish to become pregnant or think they might be pregnant. Men should not father a child while taking this medicine and for 3 months after stopping it. There is a potential for serious side effects to an unborn child. Talk to your health care professional or pharmacist for more information. Do not breast-feed an infant while takingthis medicine or for at least 1 week after stopping it. Men should inform their doctors if they wish to father a child. This medicine may lower sperm counts. Talk with your doctor or health care professional ifyou are concerned about your fertility. What side effects  may I notice from receiving this medication? Side effects that you should report to your doctor or health care professionalas soon as possible: allergic reactions like skin rash, itching or hives, swelling of the face, lips, or tongue breathing problems pain, redness, or irritation at site where injected signs and symptoms of a dangerous change in heartbeat or heart rhythm like chest pain; dizziness; fast or irregular heartbeat; palpitations; feeling faint or lightheaded, falls; breathing problems signs of decreased platelets or bleeding - bruising, pinpoint red spots on the skin, black, tarry stools, blood in the urine signs of decreased red blood cells - unusually weak or tired, feeling faint or lightheaded, falls signs of infection - fever or chills, cough, sore throat, pain or difficulty passing urine signs and symptoms of kidney injury like trouble passing urine or change in the amount of urine signs and symptoms of liver injury like dark yellow or brown urine; general ill feeling or flu-like symptoms; light-colored stools; loss of appetite; nausea; right upper belly pain; unusually weak or tired; yellowing of the eyes or skin swelling of ankles, feet, hands Side effects that usually do not require medical attention (report to yourdoctor or  health care professional if they continue or are bothersome): constipation diarrhea hair loss loss of appetite nausea rash vomiting This list may not describe all possible side effects. Call your doctor for medical advice about side effects. You may report side effects to FDA at1-800-FDA-1088. Where should I keep my medication? This drug is given in a hospital or clinic and will not be stored at home. NOTE: This sheet is a summary. It may not cover all possible information. If you have questions about this medicine, talk to your doctor, pharmacist, orhealth care provider.  2022 Elsevier/Gold Standard (2017-08-30 18:06:11)   Pegfilgrastim  injection What is this medication? PEGFILGRASTIM (PEG fil gra stim) is a long-acting granulocyte colony-stimulating factor that stimulates the growth of neutrophils, a type of white blood cell important in the body's fight against infection. It is used to reduce the incidence of fever and infection in patients with certain types of cancer who are receiving chemotherapy that affects the bone marrow, and toincrease survival after being exposed to high doses of radiation. This medicine may be used for other purposes; ask your health care provider orpharmacist if you have questions. COMMON BRAND NAME(S): Rexene Edison, Ziextenzo What should I tell my care team before I take this medication? They need to know if you have any of these conditions: kidney disease latex allergy ongoing radiation therapy sickle cell disease skin reactions to acrylic adhesives (On-Body Injector only) an unusual or allergic reaction to pegfilgrastim, filgrastim, other medicines, foods, dyes, or preservatives pregnant or trying to get pregnant breast-feeding How should I use this medication? This medicine is for injection under the skin. If you get this medicine at home, you will be taught how to prepare and give the pre-filled syringe or how to use the On-body Injector. Refer to the patient Instructions for Use for detailed instructions. Use exactly as directed. Tell your healthcare provider immediately if you suspect that the On-body Injector may not have performed as intended or if you suspect the use of the On-body Injector resulted in a missedor partial dose. It is important that you put your used needles and syringes in a special sharps container. Do not put them in a trash can. If you do not have a sharpscontainer, call your pharmacist or healthcare provider to get one. Talk to your pediatrician regarding the use of this medicine in children. Whilethis drug may be prescribed for selected  conditions, precautions do apply. Overdosage: If you think you have taken too much of this medicine contact apoison control center or emergency room at once. NOTE: This medicine is only for you. Do not share this medicine with others. What if I miss a dose? It is important not to miss your dose. Call your doctor or health care professional if you miss your dose. If you miss a dose due to an On-body Injector failure or leakage, a new dose should be administered as soon aspossible using a single prefilled syringe for manual use. What may interact with this medication? Interactions have not been studied. This list may not describe all possible interactions. Give your health care provider a list of all the medicines, herbs, non-prescription drugs, or dietary supplements you use. Also tell them if you smoke, drink alcohol, or use illegaldrugs. Some items may interact with your medicine. What should I watch for while using this medication? Your condition will be monitored carefully while you are receiving thismedicine. You may need blood work done while you are taking this medicine. Talk to your  health care provider about your risk of cancer. You may be more atrisk for certain types of cancer if you take this medicine. If you are going to need a MRI, CT scan, or other procedure, tell your doctorthat you are using this medicine (On-Body Injector only). What side effects may I notice from receiving this medication? Side effects that you should report to your doctor or health care professionalas soon as possible: allergic reactions (skin rash, itching or hives, swelling of the face, lips, or tongue) back pain dizziness fever pain, redness, or irritation at site where injected pinpoint red spots on the skin red or dark-brown urine shortness of breath or breathing problems stomach or side pain, or pain at the shoulder swelling tiredness trouble passing urine or change in the amount of urine unusual  bruising or bleeding Side effects that usually do not require medical attention (report to yourdoctor or health care professional if they continue or are bothersome): bone pain muscle pain This list may not describe all possible side effects. Call your doctor for medical advice about side effects. You may report side effects to FDA at1-800-FDA-1088. Where should I keep my medication? Keep out of the reach of children. If you are using this medicine at home, you will be instructed on how to storeit. Throw away any unused medicine after the expiration date on the label. NOTE: This sheet is a summary. It may not cover all possible information. If you have questions about this medicine, talk to your doctor, pharmacist, orhealth care provider.  2022 Elsevier/Gold Standard (2020-07-03 11:54:14)

## 2020-12-15 NOTE — Progress Notes (Signed)
Hematology/Oncology Progress Note [facility]  [phone] [phone]  Virtual Visit Progress Note  I connected with George Owens on 12/15/20 at  9:45 AM EDT by video enabled telemedicine visit and verified that I am speaking with the correct person using two identifiers.   I discussed the limitations, risks, security and privacy concerns of performing an evaluation and management service by telemedicine and the availability of in-person appointments. I also discussed with the patient that there may be a patient responsible charge related to this service. The patient expressed understanding and agreed to proceed.   Other persons participating in the visit and their role in the encounter: Almedia Balls, RN   Patient's location: [facility]  Provider's location: [facility]   Chief Complaint: Pancreatic Cancer- assessment prior to chemotherapy  Patient Care Team: Crecencio Mc, MD as PCP - General (Internal Medicine) Clent Jacks, RN as Oncology Nurse Navigator   Name of the patient: George Owens  [id number]  06-19-1932   Date of visit: 12/15/20  Diagnosis- pancreatic adenocarcinoma stage I BC T2N 0M0  Chief complaint/ Reason for visit-treatment assessment prior to cycle 4-day 1 of gemcitabine Abraxane chemotherapy  Heme/Onc history: patient is a 87 year old male with a past medical history significant for GERD, hyperlipidemia, cirrhosis.  He was having symptoms of Abdominal pain and reflux and therefore underwent a CT abdomen and pelvis with contrast on 09/11/2020 which showed a hypointense ill-defined mass in the pancreatic body up to 25 mm.  No extrapancreatic infiltrative density about the aorta or proximal [name].  No local regional adenopathy or distant metastatic disease.  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  This was followed by an EUS at [facility] which showed a hypoechoic mass measuring 1.8 x 1.6 cm in the  pancreatic body.  Endosonographic borders poorly defined.  Upstream pancreatic duct dilatation.  No abnormal appearing lymph nodes.  Endosonographic imaging of the liver showed no lesion.  Biopsy showed adenocarcinoma.   PET scan showed 2.7 x 2 cm ovoid mass in the body of the pancreas with an SUV of 7.1.  18 mm hypermetabolic nodule in the isthmus of the thyroid gland.  No evidence of local regional adenopathy or distant metastatic disease.   Patient was evaluated by Dr. Hyman Hopes at [facility] for consideration of surgery and has been deemed to be upfront resectable surgical candidate.  However plan was to offer him neoadjuvant chemotherapy for 3 months to control micrometastatic disease before proceeding with definitive surgery.  If ultimately patient decides not to proceed with surgery then radiation at [facility] also remains an option.  Patient is currently getting gemcitabine Abraxane chemotherapy 1 week on 1 week of due to neutropenia and thrombocytopenia  Interval history-patient is 87 year old male who returns to clinic for further evaluation and consideration of continuation of gemcitabine Abraxane chemotherapy.  He has tolerated treatment well without significant side effects.  He continues Creon. Denies any neurologic complaints. Denies recent fevers or illnesses. Denies any easy bleeding or bruising. No melena or hematochezia. Reports good appetite and denies weight loss. Denies chest pain or shortness of breath.  Denies any nausea, vomiting, constipation, or diarrhea.  No abdominal pain.  Denies urinary complaints. Patient offers no further specific complaints today.  ECOG PS- 1 Pain scale- 0 Opioid associated constipation- no  Review of systems- Review of Systems  Constitutional:  Positive for malaise/fatigue. Negative for chills, fever and weight loss.  HENT:  Negative for congestion, ear discharge and nosebleeds.  Eyes:  Negative for blurred vision.  Respiratory:  Negative for cough,  hemoptysis, sputum production, shortness of breath and wheezing.   Cardiovascular:  Negative for chest pain, palpitations, orthopnea and claudication.  Gastrointestinal:  Negative for abdominal pain, blood in stool, constipation, diarrhea, heartburn, melena, nausea and vomiting.  Genitourinary:  Negative for dysuria, flank pain, frequency, hematuria and urgency.  Musculoskeletal:  Negative for back pain, joint pain and myalgias.  Skin:  Negative for rash.  Neurological:  Negative for dizziness, tingling, focal weakness, seizures, weakness and headaches.  Endo/Heme/Allergies:  Does not bruise/bleed easily.  Psychiatric/Behavioral:  Negative for depression and suicidal ideas. The patient does not have insomnia.      Allergies  Allergen Reactions   Kenalog [Triamcinolone Acetonide]     Blindness X 3 Days     Past Medical History:  Diagnosis Date   Allergy    Aneurysm of abdominal aorta (HCC)    Basal cell carcinoma    right temple Dr. Evorn Gong 12/2018   Chicken pox    Colon polyps    COVID-19 virus infection 03/16/2020   GERD (gastroesophageal reflux disease)    history of prostate CA 2006   Hyperlipidemia    Leg cramps    Neuropathy    Osteoporosis      Past Surgical History:  Procedure Laterality Date   CATARACT EXTRACTION Right    CATARACT EXTRACTION W/PHACO Left 12/26/2017   Procedure: CATARACT EXTRACTION PHACO AND INTRAOCULAR LENS PLACEMENT ([place])  COMPLICATED LEFT;  Surgeon: Leandrew Koyanagi, MD;  Location: [facility];  Service: Ophthalmology;  Laterality: Left;  [place]   colonoscopy     ESOPHAGOGASTRODUODENOSCOPY (EGD) WITH PROPOFOL     EYE MUSCLE SURGERY Left    PORTA CATH INSERTION N/A 10/19/2020   Procedure: PORTA CATH INSERTION;  Surgeon: Algernon Huxley, MD;  Location: [place] [facility];  Service: Cardiovascular;  Laterality: N/A;   TONSILLECTOMY AND ADENOIDECTOMY      Social History   Socioeconomic History   Marital status: Married     Spouse name: Eloise   Number of children: 4   Years of education: Not on file   Highest education level: Not on file  Occupational History   Occupation: retired  Tobacco Use   Smoking status: Never   Smokeless tobacco: Never  Vaping Use   Vaping Use: Never used  Substance and Sexual Activity   Alcohol use: No   Drug use: No   Sexual activity: Yes    Partners: Female  Other Topics Concern   Not on file  Social History Narrative   Lives with wife in a 5 story retirement home.  He lives on the first floor.  4 year college degree.  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     Social Determinants of Health   Financial Resource Strain: Low Risk    Difficulty of Paying Living Expenses: Not hard at all  Food Insecurity: Not on file  Transportation Needs: No Transportation Needs   Lack of Transportation (Medical): No   Lack of Transportation (Non-Medical): No  Physical Activity: Not on file  Stress: No Stress Concern Present   Feeling of Stress : Not at all  Social Connections: Unknown   Frequency of Communication with Friends and Family: Not on file   Frequency of Social Gatherings with Friends and Family: Not on file   Attends Religious Services: Not on file   Active Member of Clubs or Organizations: Not on  file   Attends Archivist Meetings: Not on file   Marital Status: Married  Human resources officer Violence: Not on file    Family History  Problem Relation Age of Onset   Stroke Mother    Arthritis Father    Heart disease Father    Brain cancer Brother        1/2 brother (possibly related to mother NOT related to patient)   Colon cancer Neg Hx    Esophageal cancer Neg Hx    Pancreatic cancer Neg Hx    Stomach cancer Neg Hx    Liver disease Neg Hx      Current Outpatient Medications:    acetaminophen (TYLENOL) 325 MG tablet, Take 650 mg by mouth every 6 (six) hours as needed., Disp: , Rfl:    ALPRAZolam (XANAX) 0.25 MG tablet, TAKE  0.5 TABLETS (0.125 MG TOTAL) BY MOUTH AT BEDTIME AS NEEDED FOR ANXIETY OR SLEEP., Disp: 20 tablet, Rfl: 5   Calcium Carb-Cholecalciferol (CALCIUM 1000 + D PO), Take 1 tablet by mouth daily. , Disp: , Rfl:    Cholecalciferol 50 MCG (2000 UT) CAPS, Take by mouth., Disp: , Rfl:    Denosumab (PROLIA [place]), Inject into the skin every 6 (six) months., Disp: , Rfl:    fluticasone (FLONASE) 50 MCG/ACT nasal spray, Place 2 sprays into both nostrils daily., Disp: 16 g, Rfl: 6   glucosamine-chondroitin 500-400 MG tablet, Take 1 tablet by mouth daily., Disp: , Rfl:    lidocaine-prilocaine (EMLA) cream, Apply to affected area once, Disp: 30 g, Rfl: 3   lipase/protease/amylase (CREON) 36000 UNITS CPEP capsule, Take 2 capsule with the first bite of each meal and 1 capsule with the first bite of each snack, Disp: 240 capsule, Rfl: 2   loratadine (CLARITIN) 10 MG tablet, Take 10 mg by mouth daily as needed. , Disp: , Rfl:    LORazepam (ATIVAN) 0.5 MG tablet, Take 1 tablet (0.5 mg total) by mouth every 6 (six) hours as needed (Nausea or vomiting)., Disp: 30 tablet, Rfl: 0   pantoprazole (PROTONIX) 40 MG tablet, TAKE 1 TABLET BY MOUTH EVERY DAY, Disp: 30 tablet, Rfl: 0   pramoxine-hydrocortisone (PROCTOCREAM-HC) 1-1 % rectal cream, PLACE 1 APPLICATION RECTALLY 2 (TWO) TIMES DAILY., Disp: 30 g, Rfl: 0   prochlorperazine (COMPAZINE) 5 MG tablet, Take 5 mg by mouth every 6 (six) hours as needed for nausea or vomiting., Disp: , Rfl:    Ranibizumab (LUCENTIS IO), Inject into the eye as needed. As needed every three months., Disp: , Rfl:    traZODone (DESYREL) 50 MG tablet, TAKE 0.5 TABLETS (25 MG TOTAL) BY MOUTH AT BEDTIME. MAY INCREASE TO FULL TABLET IF NEEDED, Disp: 90 tablet, Rfl: 0   vitamin B-12 (CYANOCOBALAMIN) 1000 MCG tablet, Take 1,000 mcg by mouth in the morning and at bedtime., Disp: , Rfl:   Physical exam:  Vitals:   12/15/20 0936  BP: 109/65  Pulse: 77  Resp: 20  Temp: (!) 96.8 F (36 C)  TempSrc:  Tympanic  SpO2: 100%  Weight: 184 lb 9.6 oz (83.7 kg)   Physical Exam Constitutional:      Appearance: He is not ill-appearing.  Pulmonary:     E[place]ort: No respiratory distress.  Skin:    Coloration: Skin is not pale.  Neurological:     General: No focal deficit present.     Mental Status: He is oriented to person, place, and time.  Psychiatric:        Mood and  Affect: Mood normal.        Behavior: Behavior normal.     CMP Latest Ref Rng & Units 12/01/2020  Glucose 70 - 99 mg/dL 141(H)  BUN 8 - 23 mg/dL 25(H)  Creatinine 0.61 - 1.24 mg/dL 1.00  Sodium 135 - 145 mmol/L 140  Potassium 3.5 - 5.1 mmol/L 4.0  Chloride 98 - 111 mmol/L 103  CO2 22 - 32 mmol/L 28  Calcium 8.9 - 10.3 mg/dL 9.4  Total Protein 6.5 - 8.1 g/dL 6.6  Total Bilirubin 0.3 - 1.2 mg/dL 0.4  Alkaline Phos 38 - 126 U/L 45  AST 15 - 41 U/L 40  ALT 0 - 44 U/L 48(H)   CBC Latest Ref Rng & Units 12/01/2020  WBC 4.0 - 10.5 K/uL 4.0  Hemoglobin 13.0 - 17.0 g/dL 12.3(L)  Hematocrit 39.0 - 52.0 % 36.0(L)  Platelets 150 - 400 K/uL 118(L)     Assessment and plan- Patient is a 87 y.o. male with pancreatic adenocarcinoma stage IB [id number] who returns to clinic for treatment assessment prior to cycle 4-day 1 of gemcitabine Abraxane chemotherapy. Patient received treatment every other week due to neutropenia and thrombocytopenia. Plan is to receive 3 months of neoadjuvant chemotherapy to control micrometastatic disease prior to proceeding with definitive surgery vs radiation at [facility].   Labs reviewed today and acceptable for continuation of treatment.  Proceed with cycle 4-day 1 of gemcitabine Abraxane chemotherapy today.   Chemotherapy-induced neutropenia-counts stable and holding well.  He received Neulasta with day 15 cycle 3.  Hold Neulasta today. Plan for neulasta with next treatment.   Chemotherapy-induced thrombocytopenia-platelet count stable at 107.  Monitor. Hold treatment if platelet count < 100,000.   Plan:   RTC in 2 weeks for labs, MD/Rao, D8,C4 gemcitabine abraxane with neulasta support  He has appointment with Dr. Hyman Hopes at [facility] January 22, 2021. Anticipate scans to be performed at [facility].   Visit Diagnosis 1. Encounter for antineoplastic chemotherapy   2. Malignant neoplasm of body of pancreas ([place])   3. Chemotherapy-induced thrombocytopenia   4. Chemotherapy induced neutropenia (HCC)     I discussed the assessment and treatment plan with the patient. The patient was provided an opportunity to ask questions and all were answered. The patient agreed with the plan and demonstrated an understanding of the instructions.   The patient was advised to call back or seek an in-person evaluation if the symptoms worsen or if the condition fails to improve as anticipated.   I spent 30 minutes face-to-face video visit time dedicated to the care of this patient on the date of this encounter to include pre-visit review of medical oncology notes, labs, treatment summary, face-to-face time with the patient, and post visit ordering of testing/documentation.    Beckey Rutter, DNP, AGNP-[facility]

## 2020-12-15 NOTE — Progress Notes (Signed)
Neulasta OnPro not given, it will be given every other treatment per Beckey Rutter CRNP

## 2020-12-16 ENCOUNTER — Encounter: Payer: Self-pay | Admitting: Oncology

## 2020-12-16 LAB — CANCER ANTIGEN 19-9: CA 19-9: 18 U/mL (ref 0–35)

## 2020-12-18 ENCOUNTER — Other Ambulatory Visit: Payer: Self-pay | Admitting: *Deleted

## 2020-12-18 DIAGNOSIS — I712 Thoracic aortic aneurysm, without rupture, unspecified: Secondary | ICD-10-CM

## 2020-12-22 ENCOUNTER — Telehealth: Payer: Self-pay | Admitting: Gastroenterology

## 2020-12-22 NOTE — Telephone Encounter (Signed)
Patient called and has been denied for lipase/protease/amylase (CREON) 36000 UNITS CPEP capsule. He needs samples. Has more questions about this Rx. He will be out of medication tomorrow.

## 2020-12-23 MED ORDER — ZENPEP 40000-126000 UNITS PO CPEP: ORAL_CAPSULE | ORAL | 1 refills | Status: DC

## 2020-12-23 NOTE — Telephone Encounter (Signed)
Has the zenpep been denied, if not let's try this  RV

## 2020-12-23 NOTE — Telephone Encounter (Signed)
Patient verbalized understanding of instructions. Sent Zenpep to the pharmacy

## 2020-12-23 NOTE — Telephone Encounter (Signed)
Patient was denied for patient assistance for creon. Please advised what medication you will want him to change to

## 2020-12-23 NOTE — Addendum Note (Signed)
Addended by: Ulyess Blossom L on: 12/23/2020 10:17 AM   Modules accepted: Orders

## 2020-12-26 ENCOUNTER — Other Ambulatory Visit: Payer: Self-pay

## 2020-12-26 ENCOUNTER — Emergency Department
Admission: EM | Admit: 2020-12-26 | Discharge: 2020-12-27 | Disposition: A | Discharge status: Home / Self Care | Payer: PPO | Attending: Emergency Medicine | Admitting: Emergency Medicine

## 2020-12-26 ENCOUNTER — Emergency Department: Payer: PPO

## 2020-12-26 ENCOUNTER — Encounter: Payer: Self-pay | Admitting: Emergency Medicine

## 2020-12-26 DIAGNOSIS — R531 Weakness: Secondary | ICD-10-CM | POA: Diagnosis not present

## 2020-12-26 DIAGNOSIS — R509 Fever, unspecified: Secondary | ICD-10-CM | POA: Diagnosis not present

## 2020-12-26 DIAGNOSIS — C259 Malignant neoplasm of pancreas, unspecified: Secondary | ICD-10-CM

## 2020-12-26 DIAGNOSIS — Z8616 Personal history of COVID-19: Secondary | ICD-10-CM | POA: Diagnosis not present

## 2020-12-26 DIAGNOSIS — J439 Emphysema, unspecified: Secondary | ICD-10-CM | POA: Diagnosis not present

## 2020-12-26 DIAGNOSIS — Z85828 Personal history of other malignant neoplasm of skin: Secondary | ICD-10-CM | POA: Insufficient documentation

## 2020-12-26 DIAGNOSIS — N3090 Cystitis, unspecified without hematuria: Secondary | ICD-10-CM

## 2020-12-26 DIAGNOSIS — Z20822 Contact with and (suspected) exposure to covid-19: Secondary | ICD-10-CM | POA: Diagnosis not present

## 2020-12-26 DIAGNOSIS — R000 Tachycardia, unspecified: Secondary | ICD-10-CM | POA: Diagnosis not present

## 2020-12-26 LAB — URINALYSIS, COMPLETE (UACMP) WITH MICROSCOPIC
Bilirubin Urine: NEGATIVE
Glucose, UA: NEGATIVE mg/dL
Ketones, ur: NEGATIVE mg/dL
Nitrite: POSITIVE — AB
Protein, ur: NEGATIVE mg/dL
Specific Gravity, Urine: 1.015 (ref 1.005–1.030)
Squamous Epithelial / HPF: NONE SEEN (ref 0–5)
WBC, UA: >50 WBC/hpf — ABNORMAL HIGH (ref 0–5)
pH: 7 (ref 5.0–8.0)

## 2020-12-26 LAB — CBC WITH DIFFERENTIAL/PLATELET
Abs Immature Granulocytes: 0.02 10*3/uL (ref 0.00–0.07)
Basophils Absolute: 0 10*3/uL (ref 0.0–0.1)
Basophils Relative: 0 %
Eosinophils Absolute: 0 10*3/uL (ref 0.0–0.5)
Eosinophils Relative: 0 %
HCT: 38.2 % — ABNORMAL LOW (ref 39.0–52.0)
Hemoglobin: 12.9 g/dL — ABNORMAL LOW (ref 13.0–17.0)
Immature Granulocytes: 0 %
Lymphocytes Relative: 6 %
Lymphs Abs: 0.5 10*3/uL — ABNORMAL LOW (ref 0.7–4.0)
MCH: 31.9 pg (ref 26.0–34.0)
MCHC: 33.8 g/dL (ref 30.0–36.0)
MCV: 94.6 fL (ref 80.0–100.0)
Monocytes Absolute: 0.6 10*3/uL (ref 0.1–1.0)
Monocytes Relative: 7 %
Neutro Abs: 7.7 10*3/uL (ref 1.7–7.7)
Neutrophils Relative %: 87 %
Platelets: 137 10*3/uL — ABNORMAL LOW (ref 150–400)
RBC: 4.04 MIL/uL — ABNORMAL LOW (ref 4.22–5.81)
RDW: 15.3 % (ref 11.5–15.5)
WBC: 9 10*3/uL (ref 4.0–10.5)
nRBC: 0 % (ref 0.0–0.2)

## 2020-12-26 LAB — COMPREHENSIVE METABOLIC PANEL
ALT: 62 U/L — ABNORMAL HIGH (ref 0–44)
AST: 52 U/L — ABNORMAL HIGH (ref 15–41)
Albumin: 4.6 g/dL (ref 3.5–5.0)
Alkaline Phosphatase: 65 U/L (ref 38–126)
Anion gap: 8 (ref 5–15)
BUN: 25 mg/dL — ABNORMAL HIGH (ref 8–23)
CO2: 28 mmol/L (ref 22–32)
Calcium: 9 mg/dL (ref 8.9–10.3)
Chloride: 101 mmol/L (ref 98–111)
Creatinine, Ser: 0.89 mg/dL (ref 0.61–1.24)
GFR, Estimated: >60 mL/min (ref >60)
Glucose, Bld: 128 mg/dL — ABNORMAL HIGH (ref 70–99)
Potassium: 3.8 mmol/L (ref 3.5–5.1)
Sodium: 137 mmol/L (ref 135–145)
Total Bilirubin: 0.7 mg/dL (ref 0.3–1.2)
Total Protein: 7.4 g/dL (ref 6.5–8.1)

## 2020-12-26 LAB — RESP PANEL BY RT-PCR (FLU A&B, COVID) ARPGX2
Influenza A by PCR: NEGATIVE
Influenza B by PCR: NEGATIVE
SARS Coronavirus 2 by RT PCR: NEGATIVE

## 2020-12-26 LAB — PROTIME-INR
INR: 1.1 (ref 0.8–1.2)
Prothrombin Time: 14.3 seconds (ref 11.4–15.2)

## 2020-12-26 LAB — PROCALCITONIN: Procalcitonin: <0.1 ng/mL

## 2020-12-26 LAB — LACTIC ACID, PLASMA: Lactic Acid, Venous: 1.6 mmol/L (ref 0.5–1.9)

## 2020-12-26 MED ORDER — CEFDINIR 300 MG PO CAPS: 300.0000 mg | ORAL_CAPSULE | Freq: Two times a day (BID) | ORAL | 0 refills | Status: DC

## 2020-12-26 MED ORDER — SODIUM CHLORIDE 0.9 % IV SOLN
1.0000 g | Freq: Once | INTRAVENOUS | Status: AC
  Administered 2020-12-26: 1 g via INTRAVENOUS
  Filled 2020-12-26: qty 10

## 2020-12-26 MED ORDER — SODIUM CHLORIDE 0.9 % IV BOLUS
1000.0000 mL | Freq: Once | INTRAVENOUS | Status: AC
  Administered 2020-12-26: 1000 mL via INTRAVENOUS

## 2020-12-26 MED ORDER — SODIUM CHLORIDE 0.9 % IV BOLUS
500.0000 mL | Freq: Once | INTRAVENOUS | Status: AC
  Administered 2020-12-26: 500 mL via INTRAVENOUS

## 2020-12-26 NOTE — ED Triage Notes (Signed)
Pt to ED from home c/o fever, nausea, dysuria and frequency today.  Pt states took tylenol around 1600, hx of pancreatic cancer with last treatment June 28th.  Pt A&Ox4, chest rise even and unlabored, in NAD at this time.

## 2020-12-26 NOTE — ED Provider Notes (Signed)
[facility] Emergency Department Provider Note  ____________________________________________  Time seen: Approximately 10:18 PM  I have reviewed the triage vital signs and the nursing notes.   HISTORY  Chief Complaint Fever and Dysuria    HPI LINK George Owens is a 87 y.o. male with a history of GERD, pancreatic cancer, abdominal aortic aneurysm who comes ED complaining of fever nausea dysuria and urinary frequency that started at 3:00 PM with chills.  No chest pain shortness of breath or cough.  No dizziness or syncope.  Denies pain.  No body aches vomiting or diarrhea.  Last chemo was 12 days ago.    Past Medical History:  Diagnosis Date  . Allergy   . Aneurysm of abdominal aorta (HCC)   . Basal cell carcinoma    right temple Dr. Evorn Gong 12/2018  . Chicken pox   . Colon polyps   . COVID-19 virus infection 03/16/2020  . GERD (gastroesophageal reflux disease)   . history of prostate CA 2006  . Hyperlipidemia   . Leg cramps   . Neuropathy   . Osteoporosis      Patient Active Problem List   Diagnosis Date Noted  . Malignant neoplasm of body of pancreas ([place]) 09/18/2020  . Goals of care, counseling/discussion 09/18/2020  . Cirrhosis of liver ([place]) 05/12/2020  . Thoracic ascending aortic aneurysm ([place]) 04/29/2020  . Abnormal computerized axial tomography of liver 04/29/2020  . Snoring 04/28/2020  . Use of leuprolide acetate (Lupron) 04/27/2020  . Thoracic aortic atherosclerosis ([place]) 04/14/2020  . Anxiety about health 04/14/2020  . Nocturia 04/14/2020  . 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  . Constipation due to outlet dysfunction 02/16/2019  . Dysphagia, pharyngoesophageal phase 08/01/2018  . GERD (gastroesophageal reflux disease) 04/07/2018  . Post-nasal drainage 06/28/2017  . Change in bowel function 11/20/2016  . Neuropathy 03/19/2014  . Spinal stenosis of lumbar region 02/26/2014  . Osteoporosis 10/06/2013  . Vitamin D deficiency  09/10/2013  . History of prostate cancer 03/01/2013  . Personal history of colonic polyps 02/27/2013  . Myalgia due to statin 02/27/2013  . Visit for preventive health examination 02/27/2013  . Hyperlipidemia      Past Surgical History:  Procedure Laterality Date  . CATARACT EXTRACTION Right   . CATARACT EXTRACTION W/PHACO Left 12/26/2017   Procedure: CATARACT EXTRACTION PHACO AND INTRAOCULAR LENS PLACEMENT ([place])  COMPLICATED LEFT;  Surgeon: Leandrew Koyanagi, MD;  Location: [facility];  Service: Ophthalmology;  Laterality: Left;  [place]  . colonoscopy    . ESOPHAGOGASTRODUODENOSCOPY (EGD) WITH PROPOFOL    . EYE MUSCLE SURGERY Left   . PORTA CATH INSERTION N/A 10/19/2020   Procedure: PORTA CATH INSERTION;  Surgeon: Algernon Huxley, MD;  Location: [facility];  Service: Cardiovascular;  Laterality: N/A;  . TONSILLECTOMY AND ADENOIDECTOMY       Prior to Admission medications   Medication Sig Start Date End Date Taking? Authorizing Provider  cefdinir (OMNICEF) 300 MG capsule Take 1 capsule (300 mg total) by mouth 2 (two) times daily. 12/26/20  Yes Carrie Mew, MD  acetaminophen (TYLENOL) 325 MG tablet Take 650 mg by mouth every 6 (six) hours as needed.    [provider]  ALPRAZolam (XANAX) 0.25 MG tablet TAKE 0.5 TABLETS (0.125 MG TOTAL) BY MOUTH AT BEDTIME AS NEEDED FOR ANXIETY OR SLEEP. 11/24/20   Crecencio Mc, MD  Calcium Carb-Cholecalciferol (CALCIUM 1000 + D PO) Take 1 tablet by mouth daily.     [provider]  Cholecalciferol 50 MCG (2000 UT) CAPS Take by mouth.    [provider]  Denosumab (PROLIA [place]) Inject into the skin every 6 (six) months.    [provider]  fluticasone (FLONASE) 50 MCG/ACT nasal spray Place 2 sprays into both nostrils daily. 10/16/20   Crecencio Mc, MD  glucosamine-chondroitin 500-400 MG tablet Take 1 tablet by mouth daily.    [provider]  lidocaine-prilocaine (EMLA)  cream Apply to affected area once 10/04/20   Sindy Guadeloupe, MD  loratadine (CLARITIN) 10 MG tablet Take 10 mg by mouth daily as needed.     [provider]  LORazepam (ATIVAN) 0.5 MG tablet Take 1 tablet (0.5 mg total) by mouth every 6 (six) hours as needed (Nausea or vomiting). 10/04/20   Sindy Guadeloupe, MD  Pancrelipase, Lip-Prot-Amyl, (ZENPEP) [phone] units CPEP Take 2 capsule with the first bite of each meal and 1 capsule with the first bite of each snack 12/23/20   Lin Landsman, MD  pantoprazole (PROTONIX) 40 MG tablet TAKE 1 TABLET BY MOUTH EVERY DAY 12/07/20   Sindy Guadeloupe, MD  pramoxine-hydrocortisone (PROCTOCREAM-HC) 1-1 % rectal cream PLACE 1 APPLICATION RECTALLY 2 (TWO) TIMES DAILY. 06/02/20   Crecencio Mc, MD  prochlorperazine (COMPAZINE) 5 MG tablet Take 5 mg by mouth every 6 (six) hours as needed for nausea or vomiting.    [provider]  Ranibizumab (LUCENTIS IO) Inject into the eye as needed. As needed every three months.    [provider]  traZODone (DESYREL) 50 MG tablet TAKE 0.5 TABLETS (25 MG TOTAL) BY MOUTH AT BEDTIME. MAY INCREASE TO FULL TABLET IF NEEDED 11/24/20   Crecencio Mc, MD  vitamin B-12 (CYANOCOBALAMIN) 1000 MCG tablet Take 1,000 mcg by mouth in the morning and at bedtime.    [provider]     Allergies Kenalog [triamcinolone acetonide]   Family History  Problem Relation Age of Onset  . Stroke Mother   . Arthritis Father   . Heart disease Father   . Brain cancer Brother        1/2 brother (possibly related to mother NOT related to patient)  . Colon cancer Neg Hx   . Esophageal cancer Neg Hx   . Pancreatic cancer Neg Hx   . Stomach cancer Neg Hx   . Liver disease Neg Hx     Social History Social History   Tobacco Use  . Smoking status: Never  . Smokeless tobacco: Never  Vaping Use  . Vaping Use: Never used  Substance Use Topics  . Alcohol use: No  . Drug use: No    Review of  Systems  Constitutional: Positive fever and chills.  ENT:   No sore throat. No rhinorrhea. Cardiovascular:   No chest pain or syncope. Respiratory:   No dyspnea or cough. Gastrointestinal:   Negative for abdominal pain, vomiting and diarrhea.  Musculoskeletal:   Negative for focal pain or swelling All other systems reviewed and are negative except as documented above in ROS and HPI.  ____________________________________________   PHYSICAL EXAM:  VITAL SIGNS: ED Triage Vitals  Enc Vitals Group     BP 12/26/20 2000 (!) 151/83     Pulse Rate 12/26/20 2000 (!) 117     Resp 12/26/20 2000 18     Temp 12/26/20 2000 (!) 100.5 F (38.1 C)     Temp Source 12/26/20 2000 Oral     SpO2 12/26/20 2000 100 %  Weight 12/26/20 2001 185 lb (83.9 kg)     Height 12/26/20 2001 6\' 4"  (1.93 m)     Head Circumference --      Peak Flow --      Pain Score 12/26/20 2001 0     Pain Loc --      Pain Edu? --      Excl. in [place]? --     Vital signs reviewed, nursing assessments reviewed.   Constitutional:   Alert and oriented. Non-toxic appearance. Eyes:   Conjunctivae are normal. EOMI. PERRL. ENT      Head:   Normocephalic and atraumatic.      Nose:   Normal      Mouth/Throat:   Dry mucous membranes.      Neck:   No meningismus. Full ROM. Hematological/Lymphatic/Immunilogical:   No cervical lymphadenopathy. Cardiovascular:   Tachycardia heart rate 115. Symmetric bilateral radial and DP pulses.  No murmurs. Cap refill less than 2 seconds. Respiratory:   Normal respiratory effort without tachypnea/retractions. Breath sounds are clear and equal bilaterally. No wheezes/rales/rhonchi. Gastrointestinal:   Soft with suprapubic tenderness. Non distended. There is no CVA tenderness.  No rebound, rigidity, or guarding. Genitourinary:   deferred Musculoskeletal:   Normal range of motion in all extremities. No joint effusions.  No lower extremity tenderness.  No edema. Neurologic:   Normal speech and  language.  Motor grossly intact. No acute focal neurologic deficits are appreciated.  Skin:    Skin is warm, dry and intact. No rash noted.  No petechiae, purpura, or bullae.  ____________________________________________    LABS (pertinent positives/negatives) (all labs ordered are listed, but only abnormal results are displayed) Labs Reviewed  COMPREHENSIVE METABOLIC PANEL - Abnormal; Notable for the following components:      Result Value   Glucose, Bld 128 (*)    BUN 25 (*)    AST 52 (*)    ALT 62 (*)    All other components within normal limits  CBC WITH DIFFERENTIAL/PLATELET - Abnormal; Notable for the following components:   RBC 4.04 (*)    Hemoglobin 12.9 (*)    HCT 38.2 (*)    Platelets 137 (*)    Lymphs Abs 0.5 (*)    All other components within normal limits  URINALYSIS, COMPLETE (UACMP) WITH MICROSCOPIC - Abnormal; Notable for the following components:   Color, Urine YELLOW (*)    APPearance HAZY (*)    Hgb urine dipstick MODERATE (*)    Nitrite POSITIVE (*)    Leukocytes,Ua LARGE (*)    WBC, UA >50 (*)    Bacteria, UA RARE (*)    All other components within normal limits  RESP PANEL BY RT-PCR (FLU A&B, COVID) ARPGX2  CULTURE, BLOOD (ROUTINE X 2)  CULTURE, BLOOD (ROUTINE X 2)  LACTIC ACID, PLASMA  PROTIME-INR  PROCALCITONIN   ____________________________________________   EKG Interpreted by me Sinus tachycardia rate 113.  Normal axis.  Left bundle branch block.  No acute ischemic changes.   ____________________________________________    RADIOLOGY  DG Chest Portable 1 View  Result Date: 12/26/2020 CLINICAL DATA:  87 year old male with fever and weakness. EXAM: PORTABLE CHEST 1 VIEW COMPARISON:  Chest radiograph dated 04/25/2020 and CT dated 04/29/2020. FINDINGS: Right-sided Port-A-Cath with tip at the cavoatrial junction. Background of emphysema. No focal consolidation, pleural effusion, or pneumothorax. The cardiac silhouette is within limits.  Atherosclerotic calcification of the aorta. No acute osseous pathology. IMPRESSION: 1. No acute cardiopulmonary process. 2. Emphysema. Electronically Signed  By: Anner Crete M.D.   On: 12/26/2020 21:22    ____________________________________________   PROCEDURES Procedures  ____________________________________________  DIFFERENTIAL DIAGNOSIS   UTI, pneumonia, viral illness, dehydration, electrolyte abnormality  CLINICAL IMPRESSION / ASSESSMENT AND PLAN / ED COURSE  Medications ordered in the ED: Medications  sodium chloride 0.9 % bolus 500 mL (has no administration in time range)  sodium chloride 0.9 % bolus 1,000 mL (0 mLs Intravenous Stopped 12/26/20 2211)  cefTRIAXone (ROCEPHIN) 1 g in sodium chloride 0.9 % 100 mL IVPB (1 g Intravenous New Bag/Given 12/26/20 2147)    Pertinent labs & imaging results that were available during my care of the patient were reviewed by me and considered in my medical decision making (see chart for details).  George Owens was evaluated in Emergency Department on 12/26/2020 for the symptoms described in the history of present illness. He was evaluated in the context of the global COVID-19 pandemic, which necessitated consideration that the patient might be at risk for infection with the SARS-CoV-2 virus that causes COVID-19. Institutional protocols and algorithms that pertain to the evaluation of patients at risk for COVID-19 are in a state of rapid change based on information released by regulatory bodies including the CDC and federal and state organizations. These policies and algorithms were followed during the patient's care in the ED.   Patient presents with fever, tachycardia.  Sepsis work-up initiated.  He is nontoxic in appearance and appears dehydrated.  Will give IV fluids.  No hypotension, clinical presentation not consistent with shock.  Clinical Course as of 12/26/20 2339  Sat Dec 26, 2020  2126 UA reveals UTI.  Will give IV Rocephin.  No  history of GU instrumentation or prior MDR organisms.  No signs of shock. [PS]  2216 Work-up overall reassuring, no abnormal findings except for cystitis.  Patient feeling much better after IV fluids.  Offered admission due to age and chemotherapy, patient declined states he feels well enough to go home and take oral antibiotics.  This is reasonable [PS]  2338 Orthostatic vitals stable.  Bladder scan 170.  Ready for discharge [PS]    Clinical Course User Index [PS] Carrie Mew, MD     ____________________________________________   FINAL CLINICAL IMPRESSION(S) / ED DIAGNOSES    Final diagnoses:  Cystitis  Fever, unspecified fever cause  Pancreatic adenocarcinoma [facility])     ED Discharge Orders          Ordered    cefdinir (OMNICEF) 300 MG capsule  2 times daily        12/26/20 2218            Portions of this note were generated with dragon dictation software. Dictation errors may occur despite best attempts at proofreading.    Carrie Mew, MD 12/26/20 2221

## 2020-12-28 ENCOUNTER — Telehealth: Payer: Self-pay | Admitting: Oncology

## 2020-12-28 ENCOUNTER — Telehealth: Payer: Self-pay | Admitting: *Deleted

## 2020-12-28 NOTE — Telephone Encounter (Signed)
Patient called to report that he has been treated for a UTI this past weekend. He has been afebrile for 2 days. He would like to know if he should come for treatment as scheduled.

## 2020-12-28 NOTE — Telephone Encounter (Signed)
Left VM with patient to please return call and clarify what day he would like to r/s his treatment (his daughters are in town).

## 2020-12-28 NOTE — Telephone Encounter (Signed)
Push it out by a week

## 2020-12-28 NOTE — Telephone Encounter (Signed)
Ok if he wants to move to [place] I am fine with it

## 2020-12-29 ENCOUNTER — Inpatient Hospital Stay: Payer: PPO

## 2020-12-29 ENCOUNTER — Inpatient Hospital Stay: Payer: PPO | Admitting: Oncology

## 2020-12-31 ENCOUNTER — Other Ambulatory Visit: Payer: Self-pay | Admitting: Oncology

## 2020-12-31 LAB — CULTURE, BLOOD (ROUTINE X 2)
Culture: NO GROWTH
Culture: NO GROWTH
Special Requests: ADEQUATE

## 2021-01-01 ENCOUNTER — Telehealth: Payer: Self-pay | Admitting: Oncology

## 2021-01-01 NOTE — Telephone Encounter (Signed)
Tried patient at home and cell. Left VM making him aware that I was able to move his appointment to Monday as requested. Sending him a Mychart message also.

## 2021-01-04 ENCOUNTER — Inpatient Hospital Stay: Payer: PPO

## 2021-01-04 ENCOUNTER — Inpatient Hospital Stay (HOSPITAL_BASED_OUTPATIENT_CLINIC_OR_DEPARTMENT_OTHER): Payer: PPO | Admitting: Oncology

## 2021-01-04 ENCOUNTER — Inpatient Hospital Stay: Payer: PPO | Attending: Oncology

## 2021-01-04 ENCOUNTER — Other Ambulatory Visit: Payer: Self-pay

## 2021-01-04 ENCOUNTER — Encounter: Payer: Self-pay | Admitting: Oncology

## 2021-01-04 VITALS — BP 115/59 | HR 74 | Temp 97.2°F | Resp 18 | Wt 182.1 lb

## 2021-01-04 DIAGNOSIS — R350 Frequency of micturition: Secondary | ICD-10-CM | POA: Insufficient documentation

## 2021-01-04 DIAGNOSIS — C251 Malignant neoplasm of body of pancreas: Secondary | ICD-10-CM

## 2021-01-04 DIAGNOSIS — Z5111 Encounter for antineoplastic chemotherapy: Secondary | ICD-10-CM | POA: Diagnosis not present

## 2021-01-04 DIAGNOSIS — R6883 Chills (without fever): Secondary | ICD-10-CM | POA: Insufficient documentation

## 2021-01-04 DIAGNOSIS — R300 Dysuria: Secondary | ICD-10-CM | POA: Diagnosis not present

## 2021-01-04 LAB — CBC WITH DIFFERENTIAL/PLATELET
Abs Immature Granulocytes: 0.05 10*3/uL (ref 0.00–0.07)
Basophils Absolute: 0 10*3/uL (ref 0.0–0.1)
Basophils Relative: 1 %
Eosinophils Absolute: 0.1 10*3/uL (ref 0.0–0.5)
Eosinophils Relative: 2 %
HCT: 37.8 % — ABNORMAL LOW (ref 39.0–52.0)
Hemoglobin: 12.5 g/dL — ABNORMAL LOW (ref 13.0–17.0)
Immature Granulocytes: 1 %
Lymphocytes Relative: 21 %
Lymphs Abs: 1.1 10*3/uL (ref 0.7–4.0)
MCH: 31.6 pg (ref 26.0–34.0)
MCHC: 33.1 g/dL (ref 30.0–36.0)
MCV: 95.5 fL (ref 80.0–100.0)
Monocytes Absolute: 0.6 10*3/uL (ref 0.1–1.0)
Monocytes Relative: 10 %
Neutro Abs: 3.5 10*3/uL (ref 1.7–7.7)
Neutrophils Relative %: 65 %
Platelets: 298 10*3/uL (ref 150–400)
RBC: 3.96 MIL/uL — ABNORMAL LOW (ref 4.22–5.81)
RDW: 14.8 % (ref 11.5–15.5)
WBC: 5.3 10*3/uL (ref 4.0–10.5)
nRBC: 0 % (ref 0.0–0.2)

## 2021-01-04 LAB — URINALYSIS, COMPLETE (UACMP) WITH MICROSCOPIC
Bacteria, UA: NONE SEEN
Bilirubin Urine: NEGATIVE
Glucose, UA: NEGATIVE mg/dL
Hgb urine dipstick: NEGATIVE
Ketones, ur: NEGATIVE mg/dL
Leukocytes,Ua: NEGATIVE
Nitrite: NEGATIVE
Protein, ur: NEGATIVE mg/dL
Specific Gravity, Urine: 1.015 (ref 1.005–1.030)
pH: 6 (ref 5.0–8.0)

## 2021-01-04 LAB — COMPREHENSIVE METABOLIC PANEL
ALT: 30 U/L (ref 0–44)
AST: 29 U/L (ref 15–41)
Albumin: 3.9 g/dL (ref 3.5–5.0)
Alkaline Phosphatase: 48 U/L (ref 38–126)
Anion gap: 7 (ref 5–15)
BUN: 20 mg/dL (ref 8–23)
CO2: 27 mmol/L (ref 22–32)
Calcium: 8.7 mg/dL — ABNORMAL LOW (ref 8.9–10.3)
Chloride: 104 mmol/L (ref 98–111)
Creatinine, Ser: 1.06 mg/dL (ref 0.61–1.24)
GFR, Estimated: >60 mL/min (ref >60)
Glucose, Bld: 166 mg/dL — ABNORMAL HIGH (ref 70–99)
Potassium: 3.7 mmol/L (ref 3.5–5.1)
Sodium: 138 mmol/L (ref 135–145)
Total Bilirubin: 0.6 mg/dL (ref 0.3–1.2)
Total Protein: 6.7 g/dL (ref 6.5–8.1)

## 2021-01-04 MED ORDER — PACLITAXEL PROTEIN-BOUND CHEMO INJECTION 100 MG
80.0000 mg/m2 | Freq: Once | INTRAVENOUS | Status: AC
  Administered 2021-01-04: 175 mg via INTRAVENOUS
  Filled 2021-01-04: qty 35

## 2021-01-04 MED ORDER — PROCHLORPERAZINE MALEATE 10 MG PO TABS
10.0000 mg | ORAL_TABLET | Freq: Once | ORAL | Status: AC
  Administered 2021-01-04: 10 mg via ORAL
  Filled 2021-01-04: qty 1

## 2021-01-04 MED ORDER — SODIUM CHLORIDE 0.9% FLUSH
10.0000 mL | INTRAVENOUS | Status: DC | PRN
Start: 2021-01-04 — End: 2021-01-04
  Administered 2021-01-04: 10 mL via INTRAVENOUS
  Filled 2021-01-04: qty 10

## 2021-01-04 MED ORDER — SODIUM CHLORIDE 0.9 % IV SOLN
Freq: Once | INTRAVENOUS | Status: AC
  Filled 2021-01-04: qty 250

## 2021-01-04 MED ORDER — HEPARIN SOD (PORK) LOCK FLUSH 100 UNIT/ML IV SOLN
INTRAVENOUS | Status: AC
  Filled 2021-01-04: qty 5

## 2021-01-04 MED ORDER — SODIUM CHLORIDE 0.9 % IV SOLN
Freq: Once | INTRAVENOUS | Status: DC
  Filled 2021-01-04: qty 250

## 2021-01-04 MED ORDER — SODIUM CHLORIDE 0.9 % IV SOLN
700.0000 mg/m2 | Freq: Once | INTRAVENOUS | Status: AC
  Administered 2021-01-04: 1520 mg via INTRAVENOUS
  Filled 2021-01-04: qty 26.3

## 2021-01-04 MED ORDER — HEPARIN SOD (PORK) LOCK FLUSH 100 UNIT/ML IV SOLN
500.0000 [IU] | Freq: Once | INTRAVENOUS | Status: DC
  Filled 2021-01-04: qty 5

## 2021-01-04 MED ORDER — HEPARIN SOD (PORK) LOCK FLUSH 100 UNIT/ML IV SOLN
500.0000 [IU] | Freq: Once | INTRAVENOUS | Status: AC | PRN
  Administered 2021-01-04: 500 [IU]
  Filled 2021-01-04: qty 5

## 2021-01-04 NOTE — Progress Notes (Signed)
Provided a printed copy of upcoming appointments at [facility] on 01/22/21, which does include CT scan on 8/5 prior to seeing Dr. Hyman Hopes.

## 2021-01-04 NOTE — Progress Notes (Signed)
Hematology/Oncology Consult note [facility]  Telephone:([phone] Fax:([phone]  Patient Care Team: Crecencio Mc, MD as PCP - General (Internal Medicine) Clent Jacks, RN as Oncology Nurse Navigator   Name of the patient: George Owens  [id number]  01-17-1932   Date of visit: 01/04/21  Diagnosis- pancreatic adenocarcinoma stage I BC T2N 0M0  Chief complaint/ Reason for visit-on treatment assessment prior to cycle 4-day 1 of gemcitabine Abraxane chemotherapy  Heme/Onc history: patient is a 87 year old male with a past medical history significant for GERD, hyperlipidemia, cirrhosis.  He was having symptoms of Abdominal pain and reflux and therefore underwent a CT abdomen and pelvis with contrast on 09/11/2020 which showed a hypointense ill-defined mass in the pancreatic body up to 25 mm.  No extrapancreatic infiltrative density about the aorta or proximal [name].  No local regional adenopathy or distant metastatic disease.  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  This was followed by an EUS at [facility] which showed a hypoechoic mass measuring 1.8 x 1.6 cm in the pancreatic body.  Endosonographic borders poorly defined.  Upstream pancreatic duct dilatation.  No abnormal appearing lymph nodes.  Endosonographic imaging of the liver showed no lesion.  Biopsy showed adenocarcinoma.   PET scan showed 2.7 x 2 cm ovoid mass in the body of the pancreas with an SUV of 7.1.  18 mm hypermetabolic nodule in the isthmus of the thyroid gland.  No evidence of local regional adenopathy or distant metastatic disease.   Patient was evaluated by Dr. Hyman Hopes at [facility] for consideration of surgery and has been deemed to be upfront resectable surgical candidate.  However plan was to offer him neoadjuvant chemotherapy for 3 months to control micrometastatic disease before proceeding with definitive surgery.  If ultimately patient decides not to proceed with surgery then radiation at  [facility] also remains an option.   Patient is currently getting gemcitabine Abraxane chemotherapy 1 week on 1 week of due to neutropenia and thrombocytopenia  Interval history-patient had an episode of UTI presenting with urinary frequency and fever with chills for which he went to the ER and was given 7 days of cefdinir.  He reports that his fevers have subsided but he still has occasional urinary frequency  ECOG PS- 1 Pain scale- 0   Review of systems- Review of Systems  Constitutional:  Positive for malaise/fatigue. Negative for chills, fever and weight loss.  HENT:  Negative for congestion, ear discharge and nosebleeds.   Eyes:  Negative for blurred vision.  Respiratory:  Negative for cough, hemoptysis, sputum production, shortness of breath and wheezing.   Cardiovascular:  Negative for chest pain, palpitations, orthopnea and claudication.  Gastrointestinal:  Negative for abdominal pain, blood in stool, constipation, diarrhea, heartburn, melena, nausea and vomiting.  Genitourinary:  Positive for frequency. Negative for dysuria, flank pain, hematuria and urgency.  Musculoskeletal:  Negative for back pain, joint pain and myalgias.  Skin:  Negative for rash.  Neurological:  Negative for dizziness, tingling, focal weakness, seizures, weakness and headaches.  Endo/Heme/Allergies:  Does not bruise/bleed easily.  Psychiatric/Behavioral:  Negative for depression and suicidal ideas. The patient does not have insomnia.       Allergies  Allergen Reactions   Kenalog [Triamcinolone Acetonide]     Blindness X 3 Days     Past Medical History:  Diagnosis Date   Allergy    Aneurysm of abdominal aorta (HCC)    Basal cell carcinoma    right temple Dr. Evorn Gong  12/2018   Chicken pox    Colon polyps    COVID-19 virus infection 03/16/2020   GERD (gastroesophageal reflux disease)    history of prostate CA 2006   Hyperlipidemia    Leg cramps    Neuropathy    Osteoporosis      Past Surgical  History:  Procedure Laterality Date   CATARACT EXTRACTION Right    CATARACT EXTRACTION W/PHACO Left 12/26/2017   Procedure: CATARACT EXTRACTION PHACO AND INTRAOCULAR LENS PLACEMENT ([place])  COMPLICATED LEFT;  Surgeon: Leandrew Koyanagi, MD;  Location: [place];  Service: Ophthalmology;  Laterality: Left;  [place]   colonoscopy     ESOPHAGOGASTRODUODENOSCOPY (EGD) WITH PROPOFOL     EYE MUSCLE SURGERY Left    PORTA CATH INSERTION N/A 10/19/2020   Procedure: PORTA CATH INSERTION;  Surgeon: Algernon Huxley, MD;  Location: [place] CV [facility];  Service: Cardiovascular;  Laterality: N/A;   TONSILLECTOMY AND ADENOIDECTOMY      Social History   Socioeconomic History   Marital status: Married    Spouse name: Eloise   Number of children: 4   Years of education: Not on file   Highest education level: Not on file  Occupational History   Occupation: retired  Tobacco Use   Smoking status: Never   Smokeless tobacco: Never  Vaping Use   Vaping Use: Never used  Substance and Sexual Activity   Alcohol use: No   Drug use: No   Sexual activity: Yes    Partners: Female  Other Topics Concern   Not on file  Social History Narrative   Lives with wife in a 5 story retirement home.  He lives on the first floor.  4 year college degree.  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     Social Determinants of Health   Financial Resource Strain: Low Risk    Difficulty of Paying Living Expenses: Not hard at all  Food Insecurity: Not on file  Transportation Needs: No Transportation Needs   Lack of Transportation (Medical): No   Lack of Transportation (Non-Medical): No  Physical Activity: Not on file  Stress: No Stress Concern Present   Feeling of Stress : Not at all  Social Connections: Unknown   Frequency of Communication with Friends and Family: Not on file   Frequency of Social Gatherings with Friends and Family: Not on file   Attends Religious  Services: Not on file   Active Member of Clubs or Organizations: Not on file   Attends Archivist Meetings: Not on file   Marital Status: Married  Human resources officer Violence: Not on file    Family History  Problem Relation Age of Onset   Stroke Mother    Arthritis Father    Heart disease Father    Brain cancer Brother        1/2 brother (possibly related to mother NOT related to patient)   Colon cancer Neg Hx    Esophageal cancer Neg Hx    Pancreatic cancer Neg Hx    Stomach cancer Neg Hx    Liver disease Neg Hx      Current Outpatient Medications:    acetaminophen (TYLENOL) 325 MG tablet, Take 650 mg by mouth every 6 (six) hours as needed., Disp: , Rfl:    ALPRAZolam (XANAX) 0.25 MG tablet, TAKE 0.5 TABLETS (0.125 MG TOTAL) BY MOUTH AT BEDTIME AS NEEDED FOR ANXIETY OR SLEEP., Disp: 20 tablet, Rfl: 5   Calcium Carb-Cholecalciferol (  CALCIUM 1000 + D PO), Take 1 tablet by mouth daily. , Disp: , Rfl:    cefdinir (OMNICEF) 300 MG capsule, Take 1 capsule (300 mg total) by mouth 2 (two) times daily., Disp: 14 capsule, Rfl: 0   Cholecalciferol 50 MCG (2000 UT) CAPS, Take by mouth., Disp: , Rfl:    Denosumab (PROLIA [place]), Inject into the skin every 6 (six) months., Disp: , Rfl:    fluticasone (FLONASE) 50 MCG/ACT nasal spray, Place 2 sprays into both nostrils daily., Disp: 16 g, Rfl: 6   glucosamine-chondroitin 500-400 MG tablet, Take 1 tablet by mouth daily., Disp: , Rfl:    lidocaine-prilocaine (EMLA) cream, Apply to affected area once, Disp: 30 g, Rfl: 3   loratadine (CLARITIN) 10 MG tablet, Take 10 mg by mouth daily as needed. , Disp: , Rfl:    LORazepam (ATIVAN) 0.5 MG tablet, Take 1 tablet (0.5 mg total) by mouth every 6 (six) hours as needed (Nausea or vomiting)., Disp: 30 tablet, Rfl: 0   Pancrelipase, Lip-Prot-Amyl, (ZENPEP) 40000-126000 units CPEP, Take 2 capsule with the first bite of each meal and 1 capsule with the first bite of each snack, Disp: 240 capsule, Rfl: 1    pantoprazole (PROTONIX) 40 MG tablet, TAKE 1 TABLET BY MOUTH EVERY DAY, Disp: 30 tablet, Rfl: 0   pramoxine-hydrocortisone (PROCTOCREAM-HC) 1-1 % rectal cream, PLACE 1 APPLICATION RECTALLY 2 (TWO) TIMES DAILY., Disp: 30 g, Rfl: 0   prochlorperazine (COMPAZINE) 5 MG tablet, Take 5 mg by mouth every 6 (six) hours as needed for nausea or vomiting., Disp: , Rfl:    Ranibizumab (LUCENTIS IO), Inject into the eye as needed. As needed every three months., Disp: , Rfl:    traZODone (DESYREL) 50 MG tablet, TAKE 0.5 TABLETS (25 MG TOTAL) BY MOUTH AT BEDTIME. MAY INCREASE TO FULL TABLET IF NEEDED, Disp: 90 tablet, Rfl: 0   vitamin B-12 (CYANOCOBALAMIN) 1000 MCG tablet, Take 1,000 mcg by mouth in the morning and at bedtime., Disp: , Rfl:  No current facility-administered medications for this visit.  Facility-Administered Medications Ordered in Other Visits:    0.9 %  sodium chloride infusion, , Intravenous, Once, Sindy Guadeloupe, MD   heparin lock flush 100 unit/mL, 500 Units, Intravenous, Once, Sindy Guadeloupe, MD   sodium chloride flush (NS) 0.9 % injection 10 mL, 10 mL, Intravenous, PRN, Sindy Guadeloupe, MD, 10 mL at 01/04/21 0953  Physical exam:  Vitals:   01/04/21 1039  BP: (!) 115/59  Pulse: 74  Resp: 18  Temp: (!) 97.2 F (36.2 C)  TempSrc: Tympanic  SpO2: 99%  Weight: 182 lb 1.6 oz (82.6 kg)   Physical Exam Constitutional:      General: He is not in acute distress. Cardiovascular:     Rate and Rhythm: Normal rate and regular rhythm.     Heart sounds: Normal heart sounds.  Pulmonary:     Effort: Pulmonary effort is normal.     Breath sounds: Normal breath sounds.  Abdominal:     General: Bowel sounds are normal.     Palpations: Abdomen is soft.  Skin:    General: Skin is warm and dry.  Neurological:     Mental Status: He is alert and oriented to person, place, and time.     CMP Latest Ref Rng & Units 01/04/2021  Glucose 70 - 99 mg/dL 166(H)  BUN 8 - 23 mg/dL 20  Creatinine 0.61 -  1.24 mg/dL 1.06  Sodium 135 - 145 mmol/L 138  Potassium 3.5 - 5.1 mmol/L 3.7  Chloride 98 - 111 mmol/L 104  CO2 22 - 32 mmol/L 27  Calcium 8.9 - 10.3 mg/dL 8.7(L)  Total Protein 6.5 - 8.1 g/dL 6.7  Total Bilirubin 0.3 - 1.2 mg/dL 0.6  Alkaline Phos 38 - 126 U/L 48  AST 15 - 41 U/L 29  ALT 0 - 44 U/L 30   CBC Latest Ref Rng & Units 01/04/2021  WBC 4.0 - 10.5 K/uL 5.3  Hemoglobin 13.0 - 17.0 g/dL 12.5(L)  Hematocrit 39.0 - 52.0 % 37.8(L)  Platelets 150 - 400 K/uL 298    No images are attached to the encounter.  DG Chest Portable 1 View  Result Date: 12/26/2020 CLINICAL DATA:  87 year old male with fever and weakness. EXAM: PORTABLE CHEST 1 VIEW COMPARISON:  Chest radiograph dated 04/25/2020 and CT dated 04/29/2020. FINDINGS: Right-sided Port-A-Cath with tip at the cavoatrial junction. Background of emphysema. No focal consolidation, pleural effusion, or pneumothorax. The cardiac silhouette is within limits. Atherosclerotic calcification of the aorta. No acute osseous pathology. IMPRESSION: 1. No acute cardiopulmonary process. 2. Emphysema. Electronically Signed   By: Anner Crete M.D.   On: 12/26/2020 21:22     Assessment and plan- Patient is a 87 y.o. 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  He is here for on treatment assessment prior to cycle 4-day 1 of gemcitabine Abraxane chemotherapy  Counts okay to proceed with cycle 4-day 1 of gemcitabine Abraxane chemotherapy today.  No on for Neulasta with this cycle.  He will come back in 2 weeks for cycle 4-day 15.  He has an appointment with Dr. Hyman Hopes at [facility] on August 5 to discuss definitive resection of his pancreatic cancer.  Urinary frequency: I did repeat his urinalysis today will not show any evidence of UTI.  He has completed 7 days of cefdinir.  Continue to monitor   Visit Diagnosis 1. Frequent urination   2. Malignant neoplasm of body of pancreas ([place])      Dr. Randa Evens, MD, MPH [facility] at [facility] [phone] 01/04/2021 4:08 PM

## 2021-01-05 ENCOUNTER — Encounter: Payer: Self-pay | Admitting: Oncology

## 2021-01-05 ENCOUNTER — Inpatient Hospital Stay: Payer: PPO

## 2021-01-05 ENCOUNTER — Inpatient Hospital Stay: Payer: PPO | Admitting: Oncology

## 2021-01-05 LAB — URINE CULTURE: Culture: NO GROWTH

## 2021-01-06 ENCOUNTER — Other Ambulatory Visit: Payer: PPO

## 2021-01-06 ENCOUNTER — Ambulatory Visit: Payer: PPO | Admitting: Oncology

## 2021-01-06 ENCOUNTER — Ambulatory Visit: Payer: PPO

## 2021-01-07 ENCOUNTER — Telehealth: Payer: Self-pay | Admitting: *Deleted

## 2021-01-07 NOTE — Telephone Encounter (Signed)
Patient notified and verbalized understanding. 

## 2021-01-07 NOTE — Telephone Encounter (Signed)
Patient would like to know if he is ok to have his final hepatitis vaccine on 01/11/2021.

## 2021-01-07 NOTE — Telephone Encounter (Signed)
yes

## 2021-01-08 ENCOUNTER — Inpatient Hospital Stay: Payer: PPO

## 2021-01-08 ENCOUNTER — Telehealth: Payer: Self-pay | Admitting: Internal Medicine

## 2021-01-08 ENCOUNTER — Other Ambulatory Visit: Payer: Self-pay | Admitting: *Deleted

## 2021-01-08 ENCOUNTER — Telehealth: Payer: Self-pay | Admitting: *Deleted

## 2021-01-08 ENCOUNTER — Inpatient Hospital Stay (HOSPITAL_BASED_OUTPATIENT_CLINIC_OR_DEPARTMENT_OTHER): Payer: PPO | Admitting: Hospice and Palliative Medicine

## 2021-01-08 ENCOUNTER — Other Ambulatory Visit: Payer: Self-pay

## 2021-01-08 VITALS — BP 116/64 | HR 76 | Temp 98.3°F | Resp 16 | Wt 183.2 lb

## 2021-01-08 DIAGNOSIS — R350 Frequency of micturition: Secondary | ICD-10-CM

## 2021-01-08 DIAGNOSIS — N390 Urinary tract infection, site not specified: Secondary | ICD-10-CM

## 2021-01-08 DIAGNOSIS — Z5111 Encounter for antineoplastic chemotherapy: Secondary | ICD-10-CM | POA: Diagnosis not present

## 2021-01-08 DIAGNOSIS — C251 Malignant neoplasm of body of pancreas: Secondary | ICD-10-CM

## 2021-01-08 LAB — CBC WITH DIFFERENTIAL/PLATELET
Abs Immature Granulocytes: 0.03 10*3/uL (ref 0.00–0.07)
Basophils Absolute: 0 10*3/uL (ref 0.0–0.1)
Basophils Relative: 0 %
Eosinophils Absolute: 0 10*3/uL (ref 0.0–0.5)
Eosinophils Relative: 0 %
HCT: 35.1 % — ABNORMAL LOW (ref 39.0–52.0)
Hemoglobin: 11.7 g/dL — ABNORMAL LOW (ref 13.0–17.0)
Immature Granulocytes: 0 %
Lymphocytes Relative: 9 %
Lymphs Abs: 0.8 10*3/uL (ref 0.7–4.0)
MCH: 31.8 pg (ref 26.0–34.0)
MCHC: 33.3 g/dL (ref 30.0–36.0)
MCV: 95.4 fL (ref 80.0–100.0)
Monocytes Absolute: 0.1 10*3/uL (ref 0.1–1.0)
Monocytes Relative: 1 %
Neutro Abs: 7.7 10*3/uL (ref 1.7–7.7)
Neutrophils Relative %: 90 %
Platelets: 185 10*3/uL (ref 150–400)
RBC: 3.68 MIL/uL — ABNORMAL LOW (ref 4.22–5.81)
RDW: 13.9 % (ref 11.5–15.5)
WBC: 8.6 10*3/uL (ref 4.0–10.5)
nRBC: 0 % (ref 0.0–0.2)

## 2021-01-08 LAB — URINALYSIS, COMPLETE (UACMP) WITH MICROSCOPIC
Bilirubin Urine: NEGATIVE
Glucose, UA: NEGATIVE mg/dL
Hgb urine dipstick: NEGATIVE
Ketones, ur: NEGATIVE mg/dL
Nitrite: NEGATIVE
Protein, ur: NEGATIVE mg/dL
Specific Gravity, Urine: 1.023 (ref 1.005–1.030)
Squamous Epithelial / HPF: NONE SEEN (ref 0–5)
WBC, UA: >50 WBC/hpf — ABNORMAL HIGH (ref 0–5)
pH: 5 (ref 5.0–8.0)

## 2021-01-08 MED ORDER — CIPROFLOXACIN HCL 250 MG PO TABS: 250.0000 mg | ORAL_TABLET | Freq: Two times a day (BID) | ORAL | 0 refills | Status: DC

## 2021-01-08 NOTE — Progress Notes (Signed)
Pt went to bathroom and felt chills and he took 2 extra strength tylenol and went to bed. Woke up and was clammy. Then this am the same way and took tylenol and it got better. Last check 99 and when he urinates he has frequency and burning. He even felt at one time was pain from rectum and he does have hemorrhoids from time to time but he did not have on and he took rectal cream in middle of night and today to be sure. All his issues seem to start at night and then lingers in the day. He is eating  good and has snacks-maybe not as much as he use to eat but does eat, tries to drink water, tea, lemonade, premier drinks.

## 2021-01-08 NOTE — Telephone Encounter (Signed)
Called patient and he says that he had fever yest and took tylenol and it got better, then today he had fever and got tylenol and now 99 temp. Did not give me a temp for anything else. As I spoke to him to ask what it was he said he knows he had fever. Having uti symptoms so we will do ua and cult to check on his frequency urination. Pt will come today to be seen

## 2021-01-08 NOTE — Telephone Encounter (Signed)
Patient called again to request assessment of his UTI symproms.

## 2021-01-08 NOTE — Progress Notes (Signed)
Symptom Management [place]  [phone] [phone]  Patient Care Team: Crecencio Mc, MD as PCP - General (Internal Medicine) Clent Jacks, RN as Oncology Nurse Navigator   Name of the patient: George Owens  [id number]  [birth year]   Date of visit: 01/08/21  Reason for Consult:  George Owens is an 87 year old man with multiple medical problems including stage I pancreatic adenocarcinoma on systemic chemo with gemcitabine/Abraxane.  Patient was recently seen in the ER on 12/26/2020 with dysuria and fever and found to have UTI and completed course of cefdinir.  He had subsequent negative urinary culture on 01/04/2021.  Patient now presents to [facility] with 24 to 48 hours of dysuria, urinary frequency, and subjective fever/chills.  Symptoms feel as if he has recurrent UTI.  Denies any neurologic complaints. Denies any easy bleeding or bruising. Reports good appetite and denies weight loss. Denies chest pain. Denies any nausea, vomiting, constipation, or diarrhea. Patient offers no further specific complaints today.  PAST MEDICAL HISTORY: Past Medical History:  Diagnosis Date   Allergy    Aneurysm of abdominal aorta (HCC)    Basal cell carcinoma    right temple Dr. Evorn Gong 12/2018   Chicken pox    Colon polyps    COVID-19 virus infection 03/16/2020   GERD (gastroesophageal reflux disease)    history of prostate CA 2006   Hyperlipidemia    Leg cramps    Neuropathy    Osteoporosis     PAST SURGICAL HISTORY:  Past Surgical History:  Procedure Laterality Date   CATARACT EXTRACTION Right    CATARACT EXTRACTION W/PHACO Left 12/26/2017   Procedure: CATARACT EXTRACTION PHACO AND INTRAOCULAR LENS PLACEMENT ([place])  COMPLICATED LEFT;  Surgeon: Leandrew Koyanagi, MD;  Location: [place];  Service: Ophthalmology;  Laterality: Left;  [place]   colonoscopy     ESOPHAGOGASTRODUODENOSCOPY (EGD) WITH PROPOFOL      EYE MUSCLE SURGERY Left    PORTA CATH INSERTION N/A 10/19/2020   Procedure: PORTA CATH INSERTION;  Surgeon: Algernon Huxley, MD;  Location: [place] [facility];  Service: Cardiovascular;  Laterality: N/A;   TONSILLECTOMY AND ADENOIDECTOMY      HEMATOLOGY/ONCOLOGY HISTORY:  Oncology History  Malignant neoplasm of body of pancreas ([place])  09/18/2020 Initial Diagnosis   Malignant neoplasm of body of pancreas ([place])    10/02/2020 Cancer Staging   Staging form: Exocrine Pancreas, AJCC 8th Edition - Clinical stage from 10/02/2020: Stage IB (cT2, cN0, cM0) - Signed by Sindy Guadeloupe, MD on 10/04/2020  Total positive nodes: 0    10/08/2020 -  Chemotherapy    Patient is on Treatment Plan: PANCREATIC ABRAXANE / GEMCITABINE D1,8,15 Q28D         ALLERGIES:  is allergic to kenalog [triamcinolone acetonide].  MEDICATIONS:  Current Outpatient Medications  Medication Sig Dispense Refill   acetaminophen (TYLENOL) 325 MG tablet Take 650 mg by mouth every 6 (six) hours as needed.     ciprofloxacin (CIPRO) 250 MG tablet Take 1 tablet (250 mg total) by mouth 2 (two) times daily. 14 tablet 0   Denosumab (PROLIA [place]) Inject into the skin every 6 (six) months.     fluticasone (FLONASE) 50 MCG/ACT nasal spray Place 2 sprays into both nostrils daily. 16 g 6   glucosamine-chondroitin 500-400 MG tablet Take 1 tablet by mouth daily.     lidocaine-prilocaine (EMLA) cream Apply to affected area once 30 g 3   loratadine (CLARITIN) 10 MG  tablet Take 10 mg by mouth daily as needed.      LORazepam (ATIVAN) 0.5 MG tablet Take 1 tablet (0.5 mg total) by mouth every 6 (six) hours as needed (Nausea or vomiting). 30 tablet 0   Pancrelipase, Lip-Prot-Amyl, (ZENPEP) 40000-126000 units CPEP Take 2 capsule with the first bite of each meal and 1 capsule with the first bite of each snack 240 capsule 1   pantoprazole (PROTONIX) 40 MG tablet TAKE 1 TABLET BY MOUTH EVERY DAY 30 tablet 0   pramoxine-hydrocortisone (PROCTOCREAM-HC) 1-1  % rectal cream PLACE 1 APPLICATION RECTALLY 2 (TWO) TIMES DAILY. 30 g 0   prochlorperazine (COMPAZINE) 5 MG tablet Take 5 mg by mouth every 6 (six) hours as needed for nausea or vomiting.     Ranibizumab (LUCENTIS IO) Inject into the eye as needed. As needed every three months.     traZODone (DESYREL) 50 MG tablet TAKE 0.5 TABLETS (25 MG TOTAL) BY MOUTH AT BEDTIME. MAY INCREASE TO FULL TABLET IF NEEDED 90 tablet 0   vitamin B-12 (CYANOCOBALAMIN) 1000 MCG tablet Take 1,000 mcg by mouth in the morning and at bedtime.     ALPRAZolam (XANAX) 0.25 MG tablet TAKE 0.5 TABLETS (0.125 MG TOTAL) BY MOUTH AT BEDTIME AS NEEDED FOR ANXIETY OR SLEEP. (Patient not taking: Reported on 01/08/2021) 20 tablet 5   Calcium Carb-Cholecalciferol (CALCIUM 1000 + D PO) Take 1 tablet by mouth daily.      Cholecalciferol 50 MCG (2000 UT) CAPS Take by mouth.     No current facility-administered medications for this visit.    VITAL SIGNS: BP 116/64   Pulse 76   Temp 98.3 F (36.8 C) (Oral)   Resp 16   Wt 183 lb 3.2 oz (83.1 kg)   BMI 22.30 kg/m  Filed Weights   01/08/21 1535  Weight: 183 lb 3.2 oz (83.1 kg)    Estimated body mass index is 22.3 kg/m as calculated from the following:   Height as of 12/26/20: '6\' 4"'$  (1.93 m).   Weight as of this encounter: 183 lb 3.2 oz (83.1 kg).  LABS: CBC:    Component Value Date/Time   WBC 8.6 01/08/2021 1450   HGB 11.7 (L) 01/08/2021 1450   HGB 14.1 07/01/2020 1419   HCT 35.1 (L) 01/08/2021 1450   HCT 42.5 07/01/2020 1419   PLT 185 01/08/2021 1450   PLT 161 07/01/2020 1419   MCV 95.4 01/08/2021 1450   MCV 91 07/01/2020 1419   MCV 91 05/29/2014 0917   NEUTROABS 7.7 01/08/2021 1450   NEUTROABS 1.9 05/29/2014 0917   LYMPHSABS 0.8 01/08/2021 1450   LYMPHSABS 1.2 05/29/2014 0917   MONOABS 0.1 01/08/2021 1450   MONOABS 0.3 05/29/2014 0917   EOSABS 0.0 01/08/2021 1450   EOSABS 0.1 05/29/2014 0917   BASOSABS 0.0 01/08/2021 1450   BASOSABS 0.0 05/29/2014 0917    Comprehensive Metabolic Panel:    Component Value Date/Time   NA 138 01/04/2021 0947   NA 145 05/29/2014 0917   K 3.7 01/04/2021 0947   K 3.6 05/29/2014 0917   CL 104 01/04/2021 0947   CL 105 05/29/2014 0917   CO2 27 01/04/2021 0947   CO2 33 (H) 05/29/2014 0917   BUN 20 01/04/2021 0947   BUN 20 (H) 05/29/2014 0917   CREATININE 1.06 01/04/2021 0947   CREATININE 0.83 04/19/2019 1544   GLUCOSE 166 (H) 01/04/2021 0947   GLUCOSE 116 (H) 05/29/2014 0917   CALCIUM 8.7 (L) 01/04/2021 0947   CALCIUM  8.3 (L) 05/29/2014 0917   AST 29 01/04/2021 0947   AST 30 05/29/2014 0917   ALT 30 01/04/2021 0947   ALT 47 05/29/2014 0917   ALKPHOS 48 01/04/2021 0947   ALKPHOS 57 05/29/2014 0917   BILITOT 0.6 01/04/2021 0947   BILITOT 0.6 05/29/2014 0917   PROT 6.7 01/04/2021 0947   PROT 6.5 05/29/2014 0917   ALBUMIN 3.9 01/04/2021 0947   ALBUMIN 3.5 05/29/2014 0917    RADIOGRAPHIC STUDIES: DG Chest Portable 1 View  Result Date: 12/26/2020 CLINICAL DATA:  87 year old male with fever and weakness. EXAM: PORTABLE CHEST 1 VIEW COMPARISON:  Chest radiograph dated 04/25/2020 and CT dated 04/29/2020. FINDINGS: Right-sided Port-A-Cath with tip at the cavoatrial junction. Background of emphysema. No focal consolidation, pleural effusion, or pneumothorax. The cardiac silhouette is within limits. Atherosclerotic calcification of the aorta. No acute osseous pathology. IMPRESSION: 1. No acute cardiopulmonary process. 2. Emphysema. Electronically Signed   By: Anner Crete M.D.   On: 12/26/2020 21:22    PERFORMANCE STATUS (ECOG) : 1 - Symptomatic but completely ambulatory  Review of Systems Unless otherwise noted, a complete review of systems is negative.  Physical Exam General: NAD Cardiovascular: regular rate and rhythm Pulmonary: clear ant fields Abdomen: soft, nontender, + bowel sounds GU: no suprapubic tenderness, no CVA tenderness Extremities: no edema, no joint deformities Skin: no  rashes Neurological: Weakness but otherwise nonfocal  Assessment and Plan- Patient is a 87 y.o. male stage I pancreatic adenocarcinoma on systemic chemotherapy with recent UTI he completed course of cefdinir who now presents with recurrent urinary frequency/urgency, dysuria, and subjective fever chills  UTI -urinalysis appears consistent with UTI.  Patient is nontoxic-appearing with stable vitals and no CVA or SP tenderness.  As he recently completed course of a cefdinir, will rotate to Cipro.  Await sensitivities on culture and will change antibiotic if needed.  Plan to repeat UA/culture when patient returns next week.  Discussed triggers for ER or urgent care utilization of the weekend if symptoms worsen.    Patient expressed understanding and was in agreement with this plan. He also understands that He can call clinic at any time with any questions, concerns, or complaints.   Thank you for allowing me to participate in the care of this very pleasant patient.   Time Total: 20 minutes  Visit consisted of counseling and education dealing with the complex and emotionally intense issues of symptom management and palliative care in the setting of serious and potentially life-threatening illness.Greater than 50%  of this time was spent counseling and coordinating care related to the above assessment and plan.  Signed by: Altha Harm, PhD, NP-C

## 2021-01-08 NOTE — Telephone Encounter (Signed)
Patient called back stating that the cancer center will see him today.

## 2021-01-08 NOTE — Telephone Encounter (Signed)
Patietn feels another UTI coming. Would like to do urine sample so he doesn't end up at the ED.

## 2021-01-08 NOTE — Telephone Encounter (Signed)
Spoke with pt and he stated that the cancer center called him back to let him know that they are going to try to work him in. He stated that they should call him back in a little bit with an appt for today. I advised pt that if he did not hear from them by 3:30 to please give our office a call back. Pt gave a verbal understanding.

## 2021-01-08 NOTE — Telephone Encounter (Signed)
Patient reports that he is again symptomatic for UTI Fever/chills last night which he treated with tylenol, frequency and painful urination.

## 2021-01-08 NOTE — Telephone Encounter (Signed)
Forwarded message

## 2021-01-11 ENCOUNTER — Other Ambulatory Visit: Payer: Self-pay

## 2021-01-11 ENCOUNTER — Ambulatory Visit (INDEPENDENT_AMBULATORY_CARE_PROVIDER_SITE_OTHER): Payer: Self-pay | Admitting: *Deleted

## 2021-01-11 ENCOUNTER — Telehealth: Payer: Self-pay | Admitting: Hospice and Palliative Medicine

## 2021-01-11 ENCOUNTER — Other Ambulatory Visit: Payer: Self-pay | Admitting: Nurse Practitioner

## 2021-01-11 ENCOUNTER — Ambulatory Visit: Payer: PPO

## 2021-01-11 DIAGNOSIS — Z23 Encounter for immunization: Secondary | ICD-10-CM

## 2021-01-11 DIAGNOSIS — Z1379 Encounter for other screening for genetic and chromosomal anomalies: Secondary | ICD-10-CM

## 2021-01-11 DIAGNOSIS — C251 Malignant neoplasm of body of pancreas: Secondary | ICD-10-CM

## 2021-01-11 LAB — URINE CULTURE: Culture: 70000 — AB

## 2021-01-11 NOTE — Progress Notes (Signed)
Patient presented for Twinrix injection to left deltoid, patient voiced no concerns nor showed any signs of distress during injection. 

## 2021-01-11 NOTE — Progress Notes (Signed)
Spoke to patient. He has not had genetic testing to date. Discussed rationale for testing. Patient agrees to referral. Referral sent.

## 2021-01-11 NOTE — Telephone Encounter (Signed)
I spoke with patient by phone.  He reports doing somewhat better with less dysuria on antibiotics.  He is still having urinary frequency.  Urinary culture was positive for pansensitive E. Coli.  Continue course of Cipro.  Patient will see Dr. Janese Banks next week.

## 2021-01-16 ENCOUNTER — Other Ambulatory Visit: Payer: Self-pay | Admitting: Oncology

## 2021-01-16 MED ORDER — CIPROFLOXACIN HCL 250 MG PO TABS: 250.0000 mg | ORAL_TABLET | Freq: Two times a day (BID) | ORAL | 0 refills | Status: DC

## 2021-01-17 ENCOUNTER — Other Ambulatory Visit: Payer: Self-pay | Admitting: Oncology

## 2021-01-17 DIAGNOSIS — C251 Malignant neoplasm of body of pancreas: Secondary | ICD-10-CM

## 2021-01-18 ENCOUNTER — Encounter: Payer: Self-pay | Admitting: Oncology

## 2021-01-18 ENCOUNTER — Inpatient Hospital Stay: Payer: PPO

## 2021-01-18 ENCOUNTER — Inpatient Hospital Stay (HOSPITAL_BASED_OUTPATIENT_CLINIC_OR_DEPARTMENT_OTHER): Payer: PPO | Admitting: Oncology

## 2021-01-18 ENCOUNTER — Other Ambulatory Visit: Payer: Self-pay

## 2021-01-18 ENCOUNTER — Inpatient Hospital Stay: Payer: PPO | Attending: Oncology

## 2021-01-18 VITALS — BP 111/62 | HR 90 | Temp 97.3°F | Resp 20 | Wt 180.6 lb

## 2021-01-18 DIAGNOSIS — K625 Hemorrhage of anus and rectum: Secondary | ICD-10-CM | POA: Insufficient documentation

## 2021-01-18 DIAGNOSIS — K7460 Unspecified cirrhosis of liver: Secondary | ICD-10-CM | POA: Insufficient documentation

## 2021-01-18 DIAGNOSIS — E041 Nontoxic single thyroid nodule: Secondary | ICD-10-CM | POA: Insufficient documentation

## 2021-01-18 DIAGNOSIS — Z87440 Personal history of urinary (tract) infections: Secondary | ICD-10-CM | POA: Diagnosis not present

## 2021-01-18 DIAGNOSIS — E785 Hyperlipidemia, unspecified: Secondary | ICD-10-CM | POA: Insufficient documentation

## 2021-01-18 DIAGNOSIS — C251 Malignant neoplasm of body of pancreas: Secondary | ICD-10-CM | POA: Insufficient documentation

## 2021-01-18 DIAGNOSIS — Z5111 Encounter for antineoplastic chemotherapy: Secondary | ICD-10-CM | POA: Insufficient documentation

## 2021-01-18 DIAGNOSIS — N390 Urinary tract infection, site not specified: Secondary | ICD-10-CM | POA: Diagnosis not present

## 2021-01-18 DIAGNOSIS — R300 Dysuria: Secondary | ICD-10-CM | POA: Diagnosis not present

## 2021-01-18 DIAGNOSIS — Z5189 Encounter for other specified aftercare: Secondary | ICD-10-CM | POA: Insufficient documentation

## 2021-01-18 DIAGNOSIS — I722 Aneurysm of renal artery: Secondary | ICD-10-CM | POA: Insufficient documentation

## 2021-01-18 DIAGNOSIS — K219 Gastro-esophageal reflux disease without esophagitis: Secondary | ICD-10-CM | POA: Diagnosis not present

## 2021-01-18 LAB — COMPREHENSIVE METABOLIC PANEL
ALT: 38 U/L (ref 0–44)
AST: 39 U/L (ref 15–41)
Albumin: 3.9 g/dL (ref 3.5–5.0)
Alkaline Phosphatase: 46 U/L (ref 38–126)
Anion gap: 12 (ref 5–15)
BUN: 19 mg/dL (ref 8–23)
CO2: 24 mmol/L (ref 22–32)
Calcium: 8.7 mg/dL — ABNORMAL LOW (ref 8.9–10.3)
Chloride: 102 mmol/L (ref 98–111)
Creatinine, Ser: 0.91 mg/dL (ref 0.61–1.24)
GFR, Estimated: >60 mL/min (ref >60)
Glucose, Bld: 149 mg/dL — ABNORMAL HIGH (ref 70–99)
Potassium: 3.7 mmol/L (ref 3.5–5.1)
Sodium: 138 mmol/L (ref 135–145)
Total Bilirubin: 0.5 mg/dL (ref 0.3–1.2)
Total Protein: 6.6 g/dL (ref 6.5–8.1)

## 2021-01-18 LAB — URINALYSIS, COMPLETE (UACMP) WITH MICROSCOPIC
Bacteria, UA: NONE SEEN
Bilirubin Urine: NEGATIVE
Glucose, UA: NEGATIVE mg/dL
Hgb urine dipstick: NEGATIVE
Ketones, ur: NEGATIVE mg/dL
Leukocytes,Ua: NEGATIVE
Nitrite: NEGATIVE
Protein, ur: NEGATIVE mg/dL
Specific Gravity, Urine: 1.014 (ref 1.005–1.030)
Squamous Epithelial / HPF: NONE SEEN (ref 0–5)
pH: 6 (ref 5.0–8.0)

## 2021-01-18 LAB — CBC WITH DIFFERENTIAL/PLATELET
Abs Immature Granulocytes: 0.01 10*3/uL (ref 0.00–0.07)
Basophils Absolute: 0 10*3/uL (ref 0.0–0.1)
Basophils Relative: 1 %
Eosinophils Absolute: 0.1 10*3/uL (ref 0.0–0.5)
Eosinophils Relative: 3 %
HCT: 37.2 % — ABNORMAL LOW (ref 39.0–52.0)
Hemoglobin: 12.5 g/dL — ABNORMAL LOW (ref 13.0–17.0)
Immature Granulocytes: 0 %
Lymphocytes Relative: 25 %
Lymphs Abs: 1 10*3/uL (ref 0.7–4.0)
MCH: 31.9 pg (ref 26.0–34.0)
MCHC: 33.6 g/dL (ref 30.0–36.0)
MCV: 94.9 fL (ref 80.0–100.0)
Monocytes Absolute: 0.5 10*3/uL (ref 0.1–1.0)
Monocytes Relative: 12 %
Neutro Abs: 2.4 10*3/uL (ref 1.7–7.7)
Neutrophils Relative %: 59 %
Platelets: 132 10*3/uL — ABNORMAL LOW (ref 150–400)
RBC: 3.92 MIL/uL — ABNORMAL LOW (ref 4.22–5.81)
RDW: 13.6 % (ref 11.5–15.5)
WBC: 4.1 10*3/uL (ref 4.0–10.5)
nRBC: 0 % (ref 0.0–0.2)

## 2021-01-18 MED ORDER — SODIUM CHLORIDE 0.9 % IV SOLN
700.0000 mg/m2 | Freq: Once | INTRAVENOUS | Status: AC
  Administered 2021-01-18: 1520 mg via INTRAVENOUS
  Filled 2021-01-18: qty 26.3

## 2021-01-18 MED ORDER — HEPARIN SOD (PORK) LOCK FLUSH 100 UNIT/ML IV SOLN
INTRAVENOUS | Status: AC
  Filled 2021-01-18: qty 5

## 2021-01-18 MED ORDER — PEGFILGRASTIM 6 MG/0.6ML ~~LOC~~ PSKT
6.0000 mg | PREFILLED_SYRINGE | Freq: Once | SUBCUTANEOUS | Status: AC
  Administered 2021-01-18: 6 mg via SUBCUTANEOUS
  Filled 2021-01-18: qty 0.6

## 2021-01-18 MED ORDER — SODIUM CHLORIDE 0.9 % IV SOLN
Freq: Once | INTRAVENOUS | Status: AC
  Filled 2021-01-18: qty 250

## 2021-01-18 MED ORDER — PACLITAXEL PROTEIN-BOUND CHEMO INJECTION 100 MG
80.0000 mg/m2 | Freq: Once | INTRAVENOUS | Status: AC
  Administered 2021-01-18: 175 mg via INTRAVENOUS
  Filled 2021-01-18: qty 35

## 2021-01-18 MED ORDER — PROCHLORPERAZINE MALEATE 10 MG PO TABS
10.0000 mg | ORAL_TABLET | Freq: Once | ORAL | Status: AC
  Administered 2021-01-18: 10 mg via ORAL
  Filled 2021-01-18: qty 1

## 2021-01-18 MED ORDER — HEPARIN SOD (PORK) LOCK FLUSH 100 UNIT/ML IV SOLN
500.0000 [IU] | Freq: Once | INTRAVENOUS | Status: AC | PRN
  Administered 2021-01-18: 500 [IU]
  Filled 2021-01-18: qty 5

## 2021-01-18 MED ORDER — SODIUM CHLORIDE 0.9% FLUSH
10.0000 mL | Freq: Once | INTRAVENOUS | Status: AC
  Administered 2021-01-18: 10 mL via INTRAVENOUS
  Filled 2021-01-18: qty 10

## 2021-01-18 NOTE — Patient Instructions (Addendum)
[facility]   Discharge Instructions: Thank you for choosing [facility] to provide your oncology and hematology care.  If you have a lab appointment with the [facility], please go directly to the [facility] and check in at the registration area.  Wear comfortable clothing and clothing appropriate for easy access to any Portacath or PICC line.   We strive to give you quality time with your provider. You may need to reschedule your appointment if you arrive late (15 or more minutes).  Arriving late affects you and other patients whose appointments are after yours.  Also, if you miss three or more appointments without notifying the office, you may be dismissed from the clinic at the provider's discretion.      For prescription refill requests, have your pharmacy contact our office and allow 72 hours for refills to be completed.    Today you received the following chemotherapy and/or immunotherapy agents - Abraxane, Gemzar      To help prevent nausea and vomiting after your treatment, we encourage you to take your nausea medication as directed.  BELOW ARE SYMPTOMS THAT SHOULD BE REPORTED IMMEDIATELY: *FEVER GREATER THAN 100.4 F (38 C) OR HIGHER *CHILLS OR SWEATING *NAUSEA AND VOMITING THAT IS NOT CONTROLLED WITH YOUR NAUSEA MEDICATION *UNUSUAL SHORTNESS OF BREATH *UNUSUAL BRUISING OR BLEEDING *URINARY PROBLEMS (pain or burning when urinating, or frequent urination) *BOWEL PROBLEMS (unusual diarrhea, constipation, pain near the anus) TENDERNESS IN MOUTH AND THROAT WITH OR WITHOUT PRESENCE OF ULCERS (sore throat, sores in mouth, or a toothache) UNUSUAL RASH, SWELLING OR PAIN  UNUSUAL VAGINAL DISCHARGE OR ITCHING   Items with * indicate a potential emergency and should be followed up as soon as possible or go to the Emergency Department if any problems should occur.  Please show the CHEMOTHERAPY ALERT CARD or IMMUNOTHERAPY ALERT CARD at  check-in to the Emergency Department and triage nurse.  Should you have questions after your visit or need to cancel or reschedule your appointment, please contact [facility]  [phone] and follow the prompts.  Office hours are 8:00 a.m. to 4:30 p.m. Monday - Friday. Please note that voicemails left after 4:00 p.m. may not be returned until the following business day.  We are closed weekends and major holidays. You have access to a nurse at all times for urgent questions. Please call the main number to the clinic [phone] and follow the prompts.  For any non-urgent questions, you may also contact your provider using MyChart. We now offer e-Visits for anyone 81 and older to request care online for non-urgent symptoms. For details visit [email or web address].   Also download the MyChart app! Go to the app store, search "MyChart", open the app, select [facility], and log in with your MyChart username and password.  Due to Covid, a mask is required upon entering the hospital/clinic. If you do not have a mask, one will be given to you upon arrival. For doctor visits, patients may have 1 support person aged 12 or older with them. For treatment visits, patients cannot have anyone with them due to current Covid guidelines and our immunocompromised population.   Prochlorperazine tablets What is this medication? PROCHLORPERAZINE (proe klor PER a zeen) helps to control severe nausea and vomiting. This medicine is also used to treat schizophrenia. It can also helppatients who experience anxiety that is not due to psychological illness. This medicine may be used for other purposes; ask your health  care provider orpharmacist if you have questions. COMMON BRAND NAME(S): Compazine What should I tell my care team before I take this medication? They need to know if you have any of these conditions: blockage in your bowel brain tumor dementia diabetes difficulty  swallowing glaucoma have trouble controlling your muscles head injury heart disease history of irregular heartbeat if you often drink alcohol liver disease low blood counts, like low Aiman Sonn cell, platelet, or red cell counts low blood pressure lung or breathing disease, like asthma Parkinson's disease prostate disease seizures trouble passing urine an unusual or allergic reaction to prochlorperazine, other medicines, foods, dyes, or preservatives pregnant or trying to get pregnant breast-feeding How should I use this medication? Take this medicine by mouth with a glass of water. Follow the directions on the prescription label. Take your doses at regular intervals. Do not take your medicine more often than directed. Do not stop taking this medicine suddenly. This can cause nausea, vomiting, and dizziness. Ask your doctor or health careprofessional for advice. Talk to your pediatrician regarding the use of this medicine in children. Special care may be needed. While this drug may be prescribed for children asyoung as 2 years for selected conditions, precautions do apply. Overdosage: If you think you have taken too much of this medicine contact apoison control center or emergency room at once. NOTE: This medicine is only for you. Do not share this medicine with others. What if I miss a dose? If you miss a dose, take it as soon as you can. If it is almost time for yournext dose, take only that dose. Do not take double or extra doses. What may interact with this medication? Do not take this medicine with any of the following medications: cisapride dofetilide dronedarone metoclopramide pimozide saquinavir thioridazine This medicine may also interact with the following medications: alcohol antihistamines for allergy, cough, and cold atropine certain medicines for anxiety or sleep certain medicines for bladder problems like oxybutynin, tolterodine certain medicines for depression like  amitriptyline, fluoxetine, sertraline certain medicines for stomach problems like dicyclomine, hyoscyamine certain medicines for travel sickness like scopolamine epinephrine general anesthetics like halothane, isoflurane, methoxyflurane, propofol ipratropium levodopa or other medicines for Parkinson's disease lithium medicines for blood pressure medicines for seizures like phenobarbital, primidone, phenytoin medicines that relax muscles for surgery narcotic medicines for pain propranolol warfarin This list may not describe all possible interactions. Give your health care provider a list of all the medicines, herbs, non-prescription drugs, or dietary supplements you use. Also tell them if you smoke, drink alcohol, or use illegaldrugs. Some items may interact with your medicine. What should I watch for while using this medication? Visit your health care professional for regular checks on your progress. Tell your health care professional if symptoms do not start to get better or if theyget worse. You may get drowsy or dizzy. Do not drive, use machinery, or do anything that needs mental alertness until you know how this medicine affects you. Do not stand or sit up quickly, especially if you are an older patient. This reduces the risk of dizzy or fainting spells. Alcohol may interfere with the effect ofthis medicine. Avoid alcoholic drinks. This drug can cause problems with controlling your body temperature. It can lower the response of your body to cold temperatures. If possible, stay indoors during cold weather. If you must go outdoors, wear warm clothes. It can also lower the response of your body to heat. Do not overheat. Do not over-exercise. Stay out of the  sun when possible. If you must be in the sun, wear cool clothing. Drink plenty of water. If you have trouble controlling your bodytemperature, call your health care provider right away. This medicine may increase blood sugar. Ask your health  care provider ifchanges in diet or medicines are needed if you have diabetes. This medicine can make you more sensitive to the sun. Keep out of the sun. If you cannot avoid being in the sun, wear protective clothing and use sunscreen.Do not use sun lamps or tanning beds/booths. Your mouth may get dry. Chewing sugarless gum or sucking hard candy, and drinking plenty of water may help. Contact your doctor if the problem does notgo away or is severe. What side effects may I notice from receiving this medication? Side effects that you should report to your doctor or health care professionalas soon as possible: allergic reactions like skin rash, itching or hives, swelling of the face, lips, or tongue abnormal production of milk breast enlargement in both males and females changes in vision chest pain confusion fast, irregular heartbeat fever, chills, sore throat seizures signs and symptoms of high blood sugar such as being more thirsty or hungry or having to urinate more than normal. You may also feel very tired or have blurry vision. signs and symptoms of liver injury like dark yellow or brown urine; general ill feeling or flu-like symptoms; light-colored stools; loss of appetite; nausea; right upper belly pain; unusually weak or tired; yellowing of the eyes or skin signs and symptoms of low blood pressure like dizziness; feeling faint or lightheaded, falls; unusually weak or tired trouble passing urine or change in the amount of urine trouble swallowing uncontrollable movements of the arms, face, head, mouth, neck, or upper body unusual bruising or bleeding unusually weak or tired Side effects that usually do not require medical attention (report to yourdoctor or health care professional if they continue or are bothersome): constipation drowsiness dry mouth This list may not describe all possible side effects. Call your doctor for medical advice about side effects. You may report side effects  to FDA at1-800-FDA-1088. Where should I keep my medication? Keep out of the reach of children. Store at room temperature between 15 and 30 degrees C (59 and 86 degrees F).Protect from light. Throw away any unused medicine after the expiration date. NOTE: This sheet is a summary. It may not cover all possible information. If you have questions about this medicine, talk to your doctor, pharmacist, orhealth care provider.  2022 Elsevier/Gold Standard (2019-04-30 12:26:11)  Nanoparticle Albumin-Bound Paclitaxel injection What is this medication? NANOPARTICLE ALBUMIN-BOUND PACLITAXEL (Na no PAHR ti kuhl al BYOO muhn-bound PAK li TAX el) is a chemotherapy drug. It targets fast dividing cells, like cancer cells, and causes these cells to die. This medicine is used to treatadvanced breast cancer, lung cancer, and pancreatic cancer. This medicine may be used for other purposes; ask your health care provider orpharmacist if you have questions. COMMON BRAND NAME(S): Abraxane What should I tell my care team before I take this medication? They need to know if you have any of these conditions: kidney disease liver disease low blood counts, like low Rasheeda Mulvehill cell, platelet, or red cell counts lung or breathing disease, like asthma tingling of the fingers or toes, or other nerve disorder an unusual or allergic reaction to paclitaxel, albumin, other chemotherapy, other medicines, foods, dyes, or preservatives pregnant or trying to get pregnant breast-feeding How should I use this medication? This drug is given as an infusion  into a vein. It is administered in a hospitalor clinic by a specially trained health care professional. Talk to your pediatrician regarding the use of this medicine in children.Special care may be needed. Overdosage: If you think you have taken too much of this medicine contact apoison control center or emergency room at once. NOTE: This medicine is only for you. Do not share this  medicine with others. What if I miss a dose? It is important not to miss your dose. Call your doctor or health careprofessional if you are unable to keep an appointment. What may interact with this medication? This medicine may interact with the following medications: antiviral medicines for hepatitis, HIV or AIDS certain antibiotics like erythromycin and clarithromycin certain medicines for fungal infections like ketoconazole and itraconazole certain medicines for seizures like carbamazepine, phenobarbital, phenytoin gemfibrozil nefazodone rifampin [place] wort This list may not describe all possible interactions. Give your health care provider a list of all the medicines, herbs, non-prescription drugs, or dietary supplements you use. Also tell them if you smoke, drink alcohol, or use illegaldrugs. Some items may interact with your medicine. What should I watch for while using this medication? Your condition will be monitored carefully while you are receiving this medicine. You will need important blood work done while you are taking thismedicine. This medicine can cause serious allergic reactions. If you experience allergic reactions like skin rash, itching or hives, swelling of the face, lips, ortongue, tell your doctor or health care professional right away. In some cases, you may be given additional medicines to help with side effects.Follow all directions for their use. This drug may make you feel generally unwell. This is not uncommon, as chemotherapy can affect healthy cells as well as cancer cells. Report any side effects. Continue your course of treatment even though you feel ill unless yourdoctor tells you to stop. Call your doctor or health care professional for advice if you get a fever, chills or sore throat, or other symptoms of a cold or flu. Do not treat yourself. This drug decreases your body's ability to fight infections. Try toavoid being around people who are sick. This  medicine may increase your risk to bruise or bleed. Call your doctor orhealth care professional if you notice any unusual bleeding. Be careful brushing and flossing your teeth or using a toothpick because you may get an infection or bleed more easily. If you have any dental work done,tell your dentist you are receiving this medicine. Avoid taking products that contain aspirin, acetaminophen, ibuprofen, naproxen, or ketoprofen unless instructed by your doctor. These medicines may hide afever. Do not become pregnant while taking this medicine or for 6 months after stopping it. Women should inform their doctor if they wish to become pregnant or think they might be pregnant. Men should not father a child while taking this medicine or for 3 months after stopping it. There is a potential for serious side effects to an unborn child. Talk to your health care professionalor pharmacist for more information. Do not breast-feed an infant while taking this medicine or for 2 weeks afterstopping it. This medicine may interfere with the ability to get pregnant or to father a child. You should talk to your doctor or health care professional if you areconcerned about your fertility. What side effects may I notice from receiving this medication? Side effects that you should report to your doctor or health care professionalas soon as possible: allergic reactions like skin rash, itching or hives, swelling of the  face, lips, or tongue breathing problems changes in vision fast, irregular heartbeat low blood pressure mouth sores pain, tingling, numbness in the hands or feet signs of decreased platelets or bleeding - bruising, pinpoint red spots on the skin, black, tarry stools, blood in the urine signs of decreased red blood cells - unusually weak or tired, feeling faint or lightheaded, falls signs of infection - fever or chills, cough, sore throat, pain or difficulty passing urine signs and symptoms of liver injury like  dark yellow or brown urine; general ill feeling or flu-like symptoms; light-colored stools; loss of appetite; nausea; right upper belly pain; unusually weak or tired; yellowing of the eyes or skin swelling of the ankles, feet, hands unusually slow heartbeat Side effects that usually do not require medical attention (report to yourdoctor or health care professional if they continue or are bothersome): diarrhea hair loss loss of appetite nausea, vomiting tiredness This list may not describe all possible side effects. Call your doctor for medical advice about side effects. You may report side effects to FDA at1-800-FDA-1088. Where should I keep my medication? This drug is given in a hospital or clinic and will not be stored at home. NOTE: This sheet is a summary. It may not cover all possible information. If you have questions about this medicine, talk to your doctor, pharmacist, orhealth care provider.  2022 Elsevier/Gold Standard (2017-02-07 13:03:45)  Gemcitabine injection What is this medication? GEMCITABINE (jem SYE ta been) is a chemotherapy drug. This medicine is used to treat many types of cancer like breast cancer, lung cancer, pancreatic cancer,and ovarian cancer. This medicine may be used for other purposes; ask your health care provider orpharmacist if you have questions. COMMON BRAND NAME(S): Gemzar, Infugem What should I tell my care team before I take this medication? They need to know if you have any of these conditions: blood disorders infection kidney disease liver disease lung or breathing disease, like asthma recent or ongoing radiation therapy an unusual or allergic reaction to gemcitabine, other chemotherapy, other medicines, foods, dyes, or preservatives pregnant or trying to get pregnant breast-feeding How should I use this medication? This drug is given as an infusion into a vein. It is administered in a hospitalor clinic by a specially trained health care  professional. Talk to your pediatrician regarding the use of this medicine in children.Special care may be needed. Overdosage: If you think you have taken too much of this medicine contact apoison control center or emergency room at once. NOTE: This medicine is only for you. Do not share this medicine with others. What if I miss a dose? It is important not to miss your dose. Call your doctor or health careprofessional if you are unable to keep an appointment. What may interact with this medication? medicines to increase blood counts like filgrastim, pegfilgrastim, sargramostim some other chemotherapy drugs like cisplatin vaccines Talk to your doctor or health care professional before taking any of thesemedicines: acetaminophen aspirin ibuprofen ketoprofen naproxen This list may not describe all possible interactions. Give your health care provider a list of all the medicines, herbs, non-prescription drugs, or dietary supplements you use. Also tell them if you smoke, drink alcohol, or use illegaldrugs. Some items may interact with your medicine. What should I watch for while using this medication? Visit your doctor for checks on your progress. This drug may make you feel generally unwell. This is not uncommon, as chemotherapy can affect healthy cells as well as cancer cells. Report any side effects. Continue your  course oftreatment even though you feel ill unless your doctor tells you to stop. In some cases, you may be given additional medicines to help with side effects.Follow all directions for their use. Call your doctor or health care professional for advice if you get a fever, chills or sore throat, or other symptoms of a cold or flu. Do not treat yourself. This drug decreases your body's ability to fight infections. Try toavoid being around people who are sick. This medicine may increase your risk to bruise or bleed. Call your doctor orhealth care professional if you notice any unusual  bleeding. Be careful brushing and flossing your teeth or using a toothpick because you may get an infection or bleed more easily. If you have any dental work done,tell your dentist you are receiving this medicine. Avoid taking products that contain aspirin, acetaminophen, ibuprofen, naproxen, or ketoprofen unless instructed by your doctor. These medicines may hide afever. Do not become pregnant while taking this medicine or for 6 months after stopping it. Women should inform their doctor if they wish to become pregnant or think they might be pregnant. Men should not father a child while taking this medicine and for 3 months after stopping it. There is a potential for serious side effects to an unborn child. Talk to your health care professional or pharmacist for more information. Do not breast-feed an infant while takingthis medicine or for at least 1 week after stopping it. Men should inform their doctors if they wish to father a child. This medicine may lower sperm counts. Talk with your doctor or health care professional ifyou are concerned about your fertility. What side effects may I notice from receiving this medication? Side effects that you should report to your doctor or health care professionalas soon as possible: allergic reactions like skin rash, itching or hives, swelling of the face, lips, or tongue breathing problems pain, redness, or irritation at site where injected signs and symptoms of a dangerous change in heartbeat or heart rhythm like chest pain; dizziness; fast or irregular heartbeat; palpitations; feeling faint or lightheaded, falls; breathing problems signs of decreased platelets or bleeding - bruising, pinpoint red spots on the skin, black, tarry stools, blood in the urine signs of decreased red blood cells - unusually weak or tired, feeling faint or lightheaded, falls signs of infection - fever or chills, cough, sore throat, pain or difficulty passing urine signs and symptoms  of kidney injury like trouble passing urine or change in the amount of urine signs and symptoms of liver injury like dark yellow or brown urine; general ill feeling or flu-like symptoms; light-colored stools; loss of appetite; nausea; right upper belly pain; unusually weak or tired; yellowing of the eyes or skin swelling of ankles, feet, hands Side effects that usually do not require medical attention (report to yourdoctor or health care professional if they continue or are bothersome): constipation diarrhea hair loss loss of appetite nausea rash vomiting This list may not describe all possible side effects. Call your doctor for medical advice about side effects. You may report side effects to FDA at1-800-FDA-1088. Where should I keep my medication? This drug is given in a hospital or clinic and will not be stored at home. NOTE: This sheet is a summary. It may not cover all possible information. If you have questions about this medicine, talk to your doctor, pharmacist, orhealth care provider.  2022 Elsevier/Gold Standard (2017-08-30 18:06:11)  Pegfilgrastim injection What is this medication? PEGFILGRASTIM (PEG fil gra stim) is a  long-acting granulocyte colony-stimulating factor that stimulates the growth of neutrophils, a type of Souleymane Saiki blood cell important in the body's fight against infection. It is used to reduce the incidence of fever and infection in patients with certain types of cancer who are receiving chemotherapy that affects the bone marrow, and toincrease survival after being exposed to high doses of radiation. This medicine may be used for other purposes; ask your health care provider orpharmacist if you have questions. COMMON BRAND NAME(S): Rexene Edison, Ziextenzo What should I tell my care team before I take this medication? They need to know if you have any of these conditions: kidney disease latex allergy ongoing radiation therapy sickle cell  disease skin reactions to acrylic adhesives (On-Body Injector only) an unusual or allergic reaction to pegfilgrastim, filgrastim, other medicines, foods, dyes, or preservatives pregnant or trying to get pregnant breast-feeding How should I use this medication? This medicine is for injection under the skin. If you get this medicine at home, you will be taught how to prepare and give the pre-filled syringe or how to use the On-body Injector. Refer to the patient Instructions for Use for detailed instructions. Use exactly as directed. Tell your healthcare provider immediately if you suspect that the On-body Injector may not have performed as intended or if you suspect the use of the On-body Injector resulted in a missedor partial dose. It is important that you put your used needles and syringes in a special sharps container. Do not put them in a trash can. If you do not have a sharpscontainer, call your pharmacist or healthcare provider to get one. Talk to your pediatrician regarding the use of this medicine in children. Whilethis drug may be prescribed for selected conditions, precautions do apply. Overdosage: If you think you have taken too much of this medicine contact apoison control center or emergency room at once. NOTE: This medicine is only for you. Do not share this medicine with others. What if I miss a dose? It is important not to miss your dose. Call your doctor or health care professional if you miss your dose. If you miss a dose due to an On-body Injector failure or leakage, a new dose should be administered as soon aspossible using a single prefilled syringe for manual use. What may interact with this medication? Interactions have not been studied. This list may not describe all possible interactions. Give your health care provider a list of all the medicines, herbs, non-prescription drugs, or dietary supplements you use. Also tell them if you smoke, drink alcohol, or use illegaldrugs.  Some items may interact with your medicine. What should I watch for while using this medication? Your condition will be monitored carefully while you are receiving thismedicine. You may need blood work done while you are taking this medicine. Talk to your health care provider about your risk of cancer. You may be more atrisk for certain types of cancer if you take this medicine. If you are going to need a MRI, CT scan, or other procedure, tell your doctorthat you are using this medicine (On-Body Injector only). What side effects may I notice from receiving this medication? Side effects that you should report to your doctor or health care professionalas soon as possible: allergic reactions (skin rash, itching or hives, swelling of the face, lips, or tongue) back pain dizziness fever pain, redness, or irritation at site where injected pinpoint red spots on the skin red or dark-brown urine shortness of breath or breathing problems stomach  or side pain, or pain at the shoulder swelling tiredness trouble passing urine or change in the amount of urine unusual bruising or bleeding Side effects that usually do not require medical attention (report to yourdoctor or health care professional if they continue or are bothersome): bone pain muscle pain This list may not describe all possible side effects. Call your doctor for medical advice about side effects. You may report side effects to FDA at1-800-FDA-1088. Where should I keep my medication? Keep out of the reach of children. If you are using this medicine at home, you will be instructed on how to storeit. Throw away any unused medicine after the expiration date on the label. NOTE: This sheet is a summary. It may not cover all possible information. If you have questions about this medicine, talk to your doctor, pharmacist, orhealth care provider.  2022 Elsevier/Gold Standard (2020-07-03 11:54:14)

## 2021-01-18 NOTE — Progress Notes (Signed)
Pt received abraxane gemzar infusion in clinic today. Tolerated well. Onpro placed before d/c.

## 2021-01-18 NOTE — Progress Notes (Signed)
Hematology/Oncology Consult note [facility]  Telephone:([phone] [phone]  Patient Care Team: Crecencio Mc, MD as PCP - General (Internal Medicine) Clent Jacks, RN as Oncology Nurse Navigator   Name of the patient: George Owens  ZC:[id number]  [birth year]   Date of visit: 01/18/21  Diagnosis- pancreatic adenocarcinoma stage I BC T2N 0M0    Chief complaint/ Reason for visit-on treatment assessment prior to cycle 5-day 1 of gemcitabine Abraxane chemotherapy  Heme/Onc history: patient is a 87 year old male with a past medical history significant for GERD, hyperlipidemia, cirrhosis.  He was having symptoms of Abdominal pain and reflux and therefore underwent a CT abdomen and pelvis with contrast on 09/11/2020 which showed a hypointense ill-defined mass in the pancreatic body up to 25 mm.  No extrapancreatic infiltrative density about the aorta or proximal [name].  No local regional adenopathy or distant metastatic disease.  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  This was followed by an EUS at [facility] which showed a hypoechoic mass measuring 1.8 x 1.6 cm in the pancreatic body.  Endosonographic borders poorly defined.  Upstream pancreatic duct dilatation.  No abnormal appearing lymph nodes.  Endosonographic imaging of the liver showed no lesion.  Biopsy showed adenocarcinoma.   PET scan showed 2.7 x 2 cm ovoid mass in the body of the pancreas with an SUV of 7.1.  18 mm hypermetabolic nodule in the isthmus of the thyroid gland.  No evidence of local regional adenopathy or distant metastatic disease.   Patient was evaluated by Dr. Hyman Hopes at [facility] for consideration of surgery and has been deemed to be upfront resectable surgical candidate.  However plan was to offer him neoadjuvant chemotherapy for 3 months to control micrometastatic disease before proceeding with definitive surgery.  If ultimately patient decides not to proceed with surgery then radiation  at [facility] also remains an option.   Patient is currently getting gemcitabine Abraxane chemotherapy 1 week on 1 week of due to neutropenia and thrombocytopenia    Interval history-patient was seen at symptom management last week for symptoms of dysuria and was positive for his urinalysis.  Urine culture showed 70,000 units of E. coli and patient was switched to ciprofloxacin.  His symptoms got better for 3 to 4 days but he felt that his dysuria was coming back over the weekend and therefore called Dr. Grayland Ormond who was on-call and his ciprofloxacin prescription was extended for 3 more days.  Otherwise he is tolerating chemotherapy well and denies any other complaints at this time.  He will be seeing Dr. Hyman Hopes from pancreaticobiliary surgery later this week.  ECOG PS- 1 Pain scale- 0   Review of systems- Review of Systems  Constitutional:  Positive for malaise/fatigue. Negative for chills, fever and weight loss.  HENT:  Negative for congestion, ear discharge and nosebleeds.   Eyes:  Negative for blurred vision.  Respiratory:  Negative for cough, hemoptysis, sputum production, shortness of breath and wheezing.   Cardiovascular:  Negative for chest pain, palpitations, orthopnea and claudication.  Gastrointestinal:  Negative for abdominal pain, blood in stool, constipation, diarrhea, heartburn, melena, nausea and vomiting.  Genitourinary:  Positive for dysuria. Negative for flank pain, frequency, hematuria and urgency.  Musculoskeletal:  Negative for back pain, joint pain and myalgias.  Skin:  Negative for rash.  Neurological:  Negative for dizziness, tingling, focal weakness, seizures, weakness and headaches.  Endo/Heme/Allergies:  Does not bruise/bleed easily.  Psychiatric/Behavioral:  Negative for depression and suicidal ideas. The  patient does not have insomnia.      Allergies  Allergen Reactions   Kenalog [Triamcinolone Acetonide]     Blindness X 3 Days     Past Medical History:   Diagnosis Date   Allergy    Aneurysm of abdominal aorta (HCC)    Basal cell carcinoma    right temple Dr. Evorn Gong 12/2018   Chicken pox    Colon polyps    COVID-19 virus infection 03/16/2020   GERD (gastroesophageal reflux disease)    history of prostate CA 2006   Hyperlipidemia    Leg cramps    Neuropathy    Osteoporosis      Past Surgical History:  Procedure Laterality Date   CATARACT EXTRACTION Right    CATARACT EXTRACTION W/PHACO Left 12/26/2017   Procedure: CATARACT EXTRACTION PHACO AND INTRAOCULAR LENS PLACEMENT ([place])  COMPLICATED LEFT;  Surgeon: Leandrew Koyanagi, MD;  Location: [facility];  Service: Ophthalmology;  Laterality: Left;  [place]   colonoscopy     ESOPHAGOGASTRODUODENOSCOPY (EGD) WITH PROPOFOL     EYE MUSCLE SURGERY Left    PORTA CATH INSERTION N/A 10/19/2020   Procedure: PORTA CATH INSERTION;  Surgeon: Algernon Huxley, MD;  Location: [place] [facility];  Service: Cardiovascular;  Laterality: N/A;   TONSILLECTOMY AND ADENOIDECTOMY      Social History   Socioeconomic History   Marital status: Married    Spouse name: Eloise   Number of children: 4   Years of education: Not on file   Highest education level: Not on file  Occupational History   Occupation: retired  Tobacco Use   Smoking status: Never   Smokeless tobacco: Never  Vaping Use   Vaping Use: Never used  Substance and Sexual Activity   Alcohol use: No   Drug use: No   Sexual activity: Yes    Partners: Female  Other Topics Concern   Not on file  Social History Narrative   Lives with wife in a 5 story retirement home.  He lives on the first floor.  4 year college degree.  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     Social Determinants of Health   Financial Resource Strain: Low Risk    Difficulty of Paying Living Expenses: Not hard at all  Food Insecurity: Not on file  Transportation Needs: No Transportation Needs   Lack of Transportation  (Medical): No   Lack of Transportation (Non-Medical): No  Physical Activity: Not on file  Stress: No Stress Concern Present   Feeling of Stress : Not at all  Social Connections: Unknown   Frequency of Communication with Friends and Family: Not on file   Frequency of Social Gatherings with Friends and Family: Not on file   Attends Religious Services: Not on Electrical engineer or Organizations: Not on file   Attends Archivist Meetings: Not on file   Marital Status: Married  Human resources officer Violence: Not on file    Family History  Problem Relation Age of Onset   Stroke Mother    Arthritis Father    Heart disease Father    Brain cancer Brother        1/2 brother (possibly related to mother NOT related to patient)   Colon cancer Neg Hx    Esophageal cancer Neg Hx    Pancreatic cancer Neg Hx    Stomach cancer Neg Hx    Liver disease Neg Hx  Current Outpatient Medications:    acetaminophen (TYLENOL) 325 MG tablet, Take 650 mg by mouth every 6 (six) hours as needed., Disp: , Rfl:    Calcium Carb-Cholecalciferol (CALCIUM 1000 + D PO), Take 1 tablet by mouth daily. , Disp: , Rfl:    Cholecalciferol 50 MCG (2000 UT) CAPS, Take by mouth., Disp: , Rfl:    ciprofloxacin (CIPRO) 250 MG tablet, Take 1 tablet (250 mg total) by mouth 2 (two) times daily., Disp: 10 tablet, Rfl: 0   Denosumab (PROLIA [place]), Inject into the skin every 6 (six) months., Disp: , Rfl:    fluticasone (FLONASE) 50 MCG/ACT nasal spray, Place 2 sprays into both nostrils daily., Disp: 16 g, Rfl: 6   glucosamine-chondroitin 500-400 MG tablet, Take 1 tablet by mouth daily., Disp: , Rfl:    lidocaine-prilocaine (EMLA) cream, Apply to affected area once, Disp: 30 g, Rfl: 3   loratadine (CLARITIN) 10 MG tablet, Take 10 mg by mouth daily as needed. , Disp: , Rfl:    LORazepam (ATIVAN) 0.5 MG tablet, Take 1 tablet (0.5 mg total) by mouth every 6 (six) hours as needed (Nausea or vomiting)., Disp: 30  tablet, Rfl: 0   Pancrelipase, Lip-Prot-Amyl, (ZENPEP) 40000-126000 units CPEP, Take 2 capsule with the first bite of each meal and 1 capsule with the first bite of each snack, Disp: 240 capsule, Rfl: 1   pantoprazole (PROTONIX) 40 MG tablet, TAKE 1 TABLET BY MOUTH EVERY DAY, Disp: 30 tablet, Rfl: 0   pramoxine-hydrocortisone (PROCTOCREAM-HC) 1-1 % rectal cream, PLACE 1 APPLICATION RECTALLY 2 (TWO) TIMES DAILY., Disp: 30 g, Rfl: 0   prochlorperazine (COMPAZINE) 5 MG tablet, Take 5 mg by mouth every 6 (six) hours as needed for nausea or vomiting., Disp: , Rfl:    Ranibizumab (LUCENTIS IO), Inject into the eye as needed. As needed every three months., Disp: , Rfl:    traZODone (DESYREL) 50 MG tablet, TAKE 0.5 TABLETS (25 MG TOTAL) BY MOUTH AT BEDTIME. MAY INCREASE TO FULL TABLET IF NEEDED, Disp: 90 tablet, Rfl: 0   vitamin B-12 (CYANOCOBALAMIN) 1000 MCG tablet, Take 1,000 mcg by mouth in the morning and at bedtime., Disp: , Rfl:    ALPRAZolam (XANAX) 0.25 MG tablet, TAKE 0.5 TABLETS (0.125 MG TOTAL) BY MOUTH AT BEDTIME AS NEEDED FOR ANXIETY OR SLEEP. (Patient not taking: No sig reported), Disp: 20 tablet, Rfl: 5  Physical exam:  Vitals:   01/18/21 0912  BP: 111/62  Pulse: 90  Resp: 20  Temp: (!) 97.3 F (36.3 C)  TempSrc: Tympanic  SpO2: 99%  Weight: 180 lb 9.6 oz (81.9 kg)   Physical Exam Constitutional:      General: He is not in acute distress. Cardiovascular:     Rate and Rhythm: Normal rate and regular rhythm.     Heart sounds: Normal heart sounds.  Pulmonary:     Effort: Pulmonary effort is normal.     Breath sounds: Normal breath sounds.  Abdominal:     General: Bowel sounds are normal.     Palpations: Abdomen is soft.  Skin:    General: Skin is warm and dry.  Neurological:     Mental Status: He is alert and oriented to person, place, and time.     CMP Latest Ref Rng & Units 01/18/2021  Glucose 70 - 99 mg/dL 149(H)  BUN 8 - 23 mg/dL 19  Creatinine 0.61 - 1.24 mg/dL 0.91   Sodium 135 - 145 mmol/L 138  Potassium 3.5 - 5.1 mmol/L  3.7  Chloride 98 - 111 mmol/L 102  CO2 22 - 32 mmol/L 24  Calcium 8.9 - 10.3 mg/dL 8.7(L)  Total Protein 6.5 - 8.1 g/dL 6.6  Total Bilirubin 0.3 - 1.2 mg/dL 0.5  Alkaline Phos 38 - 126 U/L 46  AST 15 - 41 U/L 39  ALT 0 - 44 U/L 38   CBC Latest Ref Rng & Units 01/18/2021  WBC 4.0 - 10.5 K/uL 4.1  Hemoglobin 13.0 - 17.0 g/dL 12.5(L)  Hematocrit 39.0 - 52.0 % 37.2(L)  Platelets 150 - 400 K/uL 132(L)    No images are attached to the encounter.  DG Chest Portable 1 View  Result Date: 12/26/2020 CLINICAL DATA:  87 year old male with fever and weakness. EXAM: PORTABLE CHEST 1 VIEW COMPARISON:  Chest radiograph dated 04/25/2020 and CT dated 04/29/2020. FINDINGS: Right-sided Port-A-Cath with tip at the cavoatrial junction. Background of emphysema. No focal consolidation, pleural effusion, or pneumothorax. The cardiac silhouette is within limits. Atherosclerotic calcification of the aorta. No acute osseous pathology. IMPRESSION: 1. No acute cardiopulmonary process. 2. Emphysema. Electronically Signed   By: Anner Crete M.D.   On: 12/26/2020 21:22     Assessment and plan- Patient is a 87 y.o. 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  He is here for on treatment assessment prior to cycle 5-day 1 of gemcitabine Abraxane chemotherapy  Counts okay to proceed with cycle 5-day 1 of gemcitabine Abraxane chemotherapy today with on pro Neulasta support.  He has been receiving treatment 2-week on week of due to his neutropenia and thrombocytopenia.  He does have an appointment coming up with pancreaticobiliary surgery Dr. Hyman Hopes this week and based on his scans decision will be made for surgery.  I will tentatively see him back in 2 weeks for cycle 5-day 15 of gemcitabine Abraxane chemotherapy but plans will change based on surgical input.  Recurrent UTI symptoms: Patient was on cefdinir for 7 days in early July.  He was seen in  symptom management on 01/07/2021 when was given a prescription for ciprofloxacin for possible UTI.  His urinalysis was suggestive of UTI at that time but urine culture showed 70,000 units of E. coli which were pansensitive.Patient had improvement in his symptoms but when he was coming off ciprofloxacin he felt like his dysuria was returning and he was given an extended course of ciprofloxacin which she will complete on 01/20/2021. I am concerned about prolonged ciprofloxacin antibiotic usage especially in the elderly event can be associated with potential side effect such as tendinitis and tendon rupture.  Also recurrent use of antibiotics can increase risk of C. difficile.  I will therefore refer him to urology for his symptoms.  Have also asked him to start taking Azo after he completes his present antibiotic course.   Visit Diagnosis 1. Frequent UTI   2. Encounter for antineoplastic chemotherapy   3. Malignant neoplasm of body of pancreas ([place])      Dr. Randa Evens, MD, MPH [facility] at [facility] [id number] 01/18/2021 12:28 PM

## 2021-01-19 ENCOUNTER — Other Ambulatory Visit: Payer: Self-pay | Admitting: *Deleted

## 2021-01-19 DIAGNOSIS — C251 Malignant neoplasm of body of pancreas: Secondary | ICD-10-CM

## 2021-01-19 LAB — URINE CULTURE: Culture: NO GROWTH

## 2021-01-19 LAB — CANCER ANTIGEN 19-9: CA 19-9: 25 U/mL (ref 0–35)

## 2021-01-19 NOTE — Progress Notes (Signed)
Amb ref

## 2021-01-21 ENCOUNTER — Other Ambulatory Visit: Payer: Self-pay

## 2021-01-21 ENCOUNTER — Inpatient Hospital Stay: Payer: PPO

## 2021-01-21 NOTE — Progress Notes (Signed)
Nutrition Assessment:  Referral for weight loss  87 year old male with stage I pancreatic cancer.  Patient maybe candidate for surgery.  Past medical history of GERD, HLD, cirrhosis, COVID +.  Patient receiving neoadjuvant chemotherapy.  Plan to meet with [facility] surgery tomorrow.  Met with patient and wife in clinic. Patient reports that he has a good appetite although last night was decreased for some reason.  Patient and wife live at the [facility] [place].  Breakfast is usually high protein cereal, sliced toast with cheese, premier protein shake and boiled egg.  Mid morning had banana yesterday.  Lunch today was spaghetti, sweet potato, and white beans.  Tonight either having meatloaf or [place] with gravy and dressing, macaroni and cheese and green beans.  Sometimes has ice cream or other desserts.  Meals are prepared for patient and wife.  Reports some indigestion.    Walks about 1 - 1 1/2 5 days a week  Medications: creon, calcium Vit D, ativan, protonix, compazine,  Vit B 12  Labs: Glucose 149  Anthropometrics:   Height: 76 inches Weight: 180 lb 9.6 oz (8/1) UBW: 192 lb about 1 year ago 4/29 187 lb BMI: 21  6% weight loss in the last year    Estimated Energy Needs  Kcals: 2400-2800 Protein: 120-140 g Fluid: 2.4 L  NUTRITION DIAGNOSIS: Unintentional weight loss related to cancer and cancer related treatment side effects as evidenced by 6% weight loss in the last year   INTERVENTION:  Recommend switching to higher calorie shake (ensure complete, Reason, [employer] 1.4, packet of carnation instant breakfast, boost plus, vital cusine 500 cal) samples given for patient to try along with coupons.  Discussed Boost VHC but RD does not have samples Discussed ways to add calories and protein.  Handout provided along with snack list. Recipe booklet given with high calorie ideas.   Contact information    MONITORING, EVALUATION, GOAL: weight trends, intake   NEXT VISIT: Monday,  August 15 during infusion  Mehdi Gironda B. Zenia Resides, [place], [place] Registered Dietitian [phone] (mobile)

## 2021-01-22 DIAGNOSIS — Z923 Personal history of irradiation: Secondary | ICD-10-CM | POA: Diagnosis not present

## 2021-01-22 DIAGNOSIS — Z08 Encounter for follow-up examination after completed treatment for malignant neoplasm: Secondary | ICD-10-CM | POA: Diagnosis not present

## 2021-01-22 DIAGNOSIS — K219 Gastro-esophageal reflux disease without esophagitis: Secondary | ICD-10-CM | POA: Diagnosis not present

## 2021-01-22 DIAGNOSIS — I82C11 Acute embolism and thrombosis of right internal jugular vein: Secondary | ICD-10-CM | POA: Diagnosis not present

## 2021-01-22 DIAGNOSIS — I722 Aneurysm of renal artery: Secondary | ICD-10-CM | POA: Diagnosis not present

## 2021-01-22 DIAGNOSIS — Z8546 Personal history of malignant neoplasm of prostate: Secondary | ICD-10-CM | POA: Diagnosis not present

## 2021-01-22 DIAGNOSIS — C251 Malignant neoplasm of body of pancreas: Secondary | ICD-10-CM | POA: Diagnosis not present

## 2021-01-25 ENCOUNTER — Ambulatory Visit: Payer: PPO | Admitting: Urology

## 2021-01-25 ENCOUNTER — Encounter: Payer: Self-pay | Admitting: Urology

## 2021-01-25 ENCOUNTER — Other Ambulatory Visit: Payer: Self-pay

## 2021-01-25 ENCOUNTER — Telehealth: Payer: Self-pay | Admitting: *Deleted

## 2021-01-25 ENCOUNTER — Encounter: Payer: Self-pay | Admitting: Oncology

## 2021-01-25 VITALS — BP 110/65 | HR 88 | Ht 76.0 in | Wt 184.1 lb

## 2021-01-25 DIAGNOSIS — N390 Urinary tract infection, site not specified: Secondary | ICD-10-CM

## 2021-01-25 DIAGNOSIS — Z8546 Personal history of malignant neoplasm of prostate: Secondary | ICD-10-CM

## 2021-01-25 LAB — BLADDER SCAN AMB NON-IMAGING: Scan Result: 188

## 2021-01-25 LAB — URINALYSIS, COMPLETE
Bilirubin, UA: NEGATIVE
Glucose, UA: NEGATIVE
Ketones, UA: NEGATIVE
Leukocytes,UA: NEGATIVE
Nitrite, UA: NEGATIVE
Protein,UA: NEGATIVE
RBC, UA: NEGATIVE
Specific Gravity, UA: 1.02 (ref 1.005–1.030)
Urobilinogen, Ur: 0.2 mg/dL (ref 0.2–1.0)
pH, UA: 5.5 (ref 5.0–7.5)

## 2021-01-25 LAB — MICROSCOPIC EXAMINATION
Bacteria, UA: NONE SEEN
RBC, Urine: NONE SEEN /hpf (ref 0–2)

## 2021-01-25 NOTE — Telephone Encounter (Signed)
Called George Owens today to ask if he got a call from Dr. Loletha Carrow about surgery date.  Dr. Janese Banks had talked to him last week and said that the surgery was going to be in 5 weeks.  I told the patient that if he could call me and if that was 5 weeks for surgery then we will not give him any more chemo but we will see him in 3 weeks with labs.  Asked patient to call me back and let me know about this

## 2021-01-25 NOTE — Progress Notes (Signed)
01/25/2021 10:13 AM   George Owens [birth year] [id number]  Referring provider: Sindy Guadeloupe, MD [place],  [place] [address]  Chief Complaint  Patient presents with   Recurrent UTI    HPI: George Owens is an 87 y.o. male referred for recurrent UTI.  Followed by oncology for pancreatic adenocarcinoma ED visit 12/26/2020 with complaints of dysuria, frequency, fever and nausea.  Temp was 100.5 UA >50 WBC/nitrite positive Given 1 g ceftriaxone in ED and discharged on cefdinir x7 days Bladder scan PVR 170 mL Urine culture was not ordered.  Follow-up culture 7/18 was negative Recurrent symptoms on 01/08/2021 complaining of low-grade temp 99, urinary frequency and dysuria Urinalysis again with >50 WBC and started on Cipro 250 mg x7 days Urine culture grew 70,000 colonies E. coli pansensitive Given an additional 10 days of Cipro 01/16/2021 which he has completed Does note urinary frequency and urgency for the last several months Prior history of prostate cancer treated with brachytherapy 2006 Had CT scan abdomen pelvis 01/22/2021 at [facility] which showed no hydronephrosis or urinary calculi   PMH: Past Medical History:  Diagnosis Date   Allergy    Aneurysm of abdominal aorta ([place])    Basal cell carcinoma    right temple Dr. Evorn Gong 12/2018   Chicken pox    Colon polyps    COVID-19 virus infection 03/16/2020   GERD (gastroesophageal reflux disease)    history of prostate CA 2006   Hyperlipidemia    Leg cramps    Neuropathy    Osteoporosis     Surgical History: Past Surgical History:  Procedure Laterality Date   CATARACT EXTRACTION Right    CATARACT EXTRACTION W/PHACO Left 12/26/2017   Procedure: CATARACT EXTRACTION PHACO AND INTRAOCULAR LENS PLACEMENT ([place])  COMPLICATED LEFT;  Surgeon: Leandrew Koyanagi, MD;  Location: [facility];  Service: Ophthalmology;  Laterality: Left;  [place]   colonoscopy     ESOPHAGOGASTRODUODENOSCOPY (EGD)  WITH PROPOFOL     EYE MUSCLE SURGERY Left    PORTA CATH INSERTION N/A 10/19/2020   Procedure: PORTA CATH INSERTION;  Surgeon: Algernon Huxley, MD;  Location: [facility];  Service: Cardiovascular;  Laterality: N/A;   TONSILLECTOMY AND ADENOIDECTOMY      Home Medications:  Allergies as of 01/25/2021       Reactions   Kenalog [triamcinolone Acetonide]    Blindness X 3 Days        Medication List        Accurate as of January 25, 2021 10:13 AM. If you have any questions, ask your nurse or doctor.          acetaminophen 325 MG tablet Commonly known as: TYLENOL Take 650 mg by mouth every 6 (six) hours as needed.   ALPRAZolam 0.25 MG tablet Commonly known as: XANAX TAKE 0.5 TABLETS (0.125 MG TOTAL) BY MOUTH AT BEDTIME AS NEEDED FOR ANXIETY OR SLEEP.   CALCIUM 1000 + D PO Take 1 tablet by mouth daily.   Cholecalciferol 50 MCG (2000 UT) Caps Take by mouth.   ciprofloxacin 250 MG tablet Commonly known as: Cipro Take 1 tablet (250 mg total) by mouth 2 (two) times daily.   fluticasone 50 MCG/ACT nasal spray Commonly known as: FLONASE Place 2 sprays into both nostrils daily.   glucosamine-chondroitin 500-400 MG tablet Take 1 tablet by mouth daily.   lidocaine-prilocaine cream Commonly known as: EMLA Apply to affected area once   loratadine 10 MG tablet Commonly  known as: CLARITIN Take 10 mg by mouth daily as needed.   LORazepam 0.5 MG tablet Commonly known as: Ativan Take 1 tablet (0.5 mg total) by mouth every 6 (six) hours as needed (Nausea or vomiting).   LUCENTIS IO Inject into the eye as needed. As needed every three months.   pantoprazole 40 MG tablet Commonly known as: PROTONIX TAKE 1 TABLET BY MOUTH EVERY DAY   pramoxine-hydrocortisone 1-1 % rectal cream Commonly known as: PROCTOCREAM-HC PLACE 1 APPLICATION RECTALLY 2 (TWO) TIMES DAILY.   prochlorperazine 5 MG tablet Commonly known as: COMPAZINE Take 5 mg by mouth every 6 (six) hours as needed  for nausea or vomiting.   PROLIA [place] Inject into the skin every 6 (six) months.   traZODone 50 MG tablet Commonly known as: DESYREL TAKE 0.5 TABLETS (25 MG TOTAL) BY MOUTH AT BEDTIME. MAY INCREASE TO FULL TABLET IF NEEDED   vitamin B-12 1000 MCG tablet Commonly known as: CYANOCOBALAMIN Take 1,000 mcg by mouth in the morning and at bedtime.   Zenpep 40000-126000 units Cpep Generic drug: Pancrelipase (Lip-Prot-Amyl) Take 2 capsule with the first bite of each meal and 1 capsule with the first bite of each snack        Allergies:  Allergies  Allergen Reactions   Kenalog [Triamcinolone Acetonide]     Blindness X 3 Days    Family History: Family History  Problem Relation Age of Onset   Stroke Mother    Arthritis Father    Heart disease Father    Brain cancer Brother        1/2 brother (possibly related to mother NOT related to patient)   Colon cancer Neg Hx    Esophageal cancer Neg Hx    Pancreatic cancer Neg Hx    Stomach cancer Neg Hx    Liver disease Neg Hx     Social History:  reports that he has never smoked. He has never used smokeless tobacco. He reports that he does not drink alcohol and does not use drugs.   Physical Exam: BP 110/65   Pulse 88   Ht '6\' 4"'$  (1.93 m)   Wt 184 lb 1.6 oz (83.5 kg)   BMI 22.41 kg/m   Constitutional:  Alert and oriented, No acute distress. HEENT: Standard AT, moist mucus membranes.  Trachea midline, no masses. Cardiovascular: No clubbing, cyanosis, or edema. Respiratory: Normal respiratory effort, no increased work of breathing. GI: Abdomen is soft, nontender, nondistended, no abdominal masses GU: Prostate 40 g, flat smooth without nodules Skin: No rashes, bruises or suspicious lesions. Neurologic: Grossly intact, no focal deficits, moving all 4 extremities. Psychiatric: Normal mood and affect.  Laboratory Data:  Urinalysis Dipstick/microscopy negative    Assessment & Plan:    1.  Urinary tract infection May have been  secondary to prostatitis and needed a longer course antibiotic therapy Presently asymptomatic and urinalysis today is clear Repeat bladder scan today was 188 mL and cystoscopy recommended  2.  History of prostate cancer   Abbie Sons, MD  [facility] [address], [place], [place] [address] [phone]

## 2021-01-27 ENCOUNTER — Telehealth: Payer: Self-pay | Admitting: *Deleted

## 2021-01-27 DIAGNOSIS — H31093 Other chorioretinal scars, bilateral: Secondary | ICD-10-CM | POA: Diagnosis not present

## 2021-01-27 DIAGNOSIS — H353223 Exudative age-related macular degeneration, left eye, with inactive scar: Secondary | ICD-10-CM | POA: Diagnosis not present

## 2021-01-27 DIAGNOSIS — H43813 Vitreous degeneration, bilateral: Secondary | ICD-10-CM | POA: Diagnosis not present

## 2021-01-27 DIAGNOSIS — H353211 Exudative age-related macular degeneration, right eye, with active choroidal neovascularization: Secondary | ICD-10-CM | POA: Diagnosis not present

## 2021-01-27 NOTE — Telephone Encounter (Deleted)
error 

## 2021-01-27 NOTE — Telephone Encounter (Addendum)
Patient called and wanted to return call back and he and his wife has thought and thought and thought about whether or not he needed surgery.  He has decided that he is going for the surgery and then afterwards will see whether or not he is going to need chemo or how things go.  Still has an ascending aorta and it is enlarged in the surgeon will go and reach out to the cardiac doctor whether or not that would be a safe surgery with that going on.  appointment date is for 9/8 for the surgery and I wish him well.  Patient will call us back when he is out of surgery and out of the hospital to let us know how everything went.  Also wanted to do a shout out for [employer] the nutritionist and she has give them a lot of information that they did not know and was really appreciative and thought she had a lot of knowledge to help anyone out when they are having nutrition problems.

## 2021-01-28 ENCOUNTER — Telehealth: Payer: Self-pay | Admitting: Gastroenterology

## 2021-01-28 ENCOUNTER — Encounter: Payer: Self-pay | Admitting: Oncology

## 2021-01-28 NOTE — Telephone Encounter (Signed)
Patient still having problems with Creon, please call [facility].

## 2021-01-30 ENCOUNTER — Other Ambulatory Visit: Payer: Self-pay | Admitting: Oncology

## 2021-01-30 NOTE — Telephone Encounter (Signed)
Request for protonix. Approved per dr Janese Banks

## 2021-02-01 ENCOUNTER — Inpatient Hospital Stay: Payer: PPO

## 2021-02-01 ENCOUNTER — Other Ambulatory Visit: Payer: Self-pay

## 2021-02-01 NOTE — Telephone Encounter (Addendum)
Patient states he is having major surgery at [facility] on 03/17/2021. He states he would like to see Dr. Marius Ditch before his surgery. He states that he is struggling keeping his weight up and does not feel like he is digesting is meals. He states he taking the creon and it is really expensive. He states the Zenpep was more expensive then the creon. Made patient a appointment for 02/02/2021 at 1:15

## 2021-02-01 NOTE — Progress Notes (Signed)
Nutrition Follow-up:  Patient with stage I pancreatic cancer.  Patient planning surgery on 9/8 at [facility].    Called patient for nutrition follow-up.  Patient reports that weight has stayed the same or increased.  Reports that he is drinking more whole milk (with cereal and 2 other times). Eating ice cream. Concerned about added sugar and possible increased blood glucose.  Says that he is eating oatmeal or high protein cereal with fruit or eggs and cheese for breakfast.  Lunch and dinner are usually meat and sides and dessert.  Drinking premier protein as had some on hand.  Has tried the other shakes and planning to order the Boost [facility] (daughter to order for him).  Reports no abdominal pain does have small loose stool with voiding but this is unchanged.  Planning to see GI this week.  Has found a new alternative to creon that he will discuss with MD    Medications: reviewed  Labs: reviewed  Anthropometrics:   Weight 184 lb on 8/8  180 lb on 8/1 UBW of 192 lb about 1 year ago   NUTRITION DIAGNOSIS: Unintentional weight loss stable    INTERVENTION:  Reviewed where to purchase Boost [facility] and will have daughter to order for him Discussed ways of adding calories without adding sugar.  Must be mindful with added fat to increase calories as may cause abdominal issues. Although patient's goal is to gain weight and increasing calories is key to reaching this goal.    Patient to discuss alternative to creon with GI MD.      MONITORING, EVALUATION, GOAL: weight trends, intake   NEXT VISIT: Monday, August 22 phone call  Lacreshia Bondarenko B. Zenia Resides, [place], [place] Registered Dietitian [phone] (mobile)

## 2021-02-02 ENCOUNTER — Ambulatory Visit (INDEPENDENT_AMBULATORY_CARE_PROVIDER_SITE_OTHER): Payer: PPO | Admitting: Gastroenterology

## 2021-02-02 ENCOUNTER — Encounter: Payer: Self-pay | Admitting: Gastroenterology

## 2021-02-02 ENCOUNTER — Other Ambulatory Visit: Payer: Self-pay

## 2021-02-02 VITALS — BP 127/80 | HR 80 | Temp 97.8°F | Ht 76.0 in | Wt 184.0 lb

## 2021-02-02 DIAGNOSIS — C259 Malignant neoplasm of pancreas, unspecified: Secondary | ICD-10-CM

## 2021-02-02 DIAGNOSIS — K8689 Other specified diseases of pancreas: Secondary | ICD-10-CM

## 2021-02-02 NOTE — Progress Notes (Signed)
Cephas Darby, MD [address]  [place]  [place], [place] [address]  Main: [phone]  Fax: [phone]    Gastroenterology Consultation  Referring Provider:     Crecencio Mc, MD Primary Care Physician:  Crecencio Mc, MD Primary Gastroenterologist:  Dr. Cephas Darby Reason for Consultation:     Altered bowel habits, abdominal bloating        HPI:   George Owens is a 87 y.o. male referred by Dr. Crecencio Mc, MD  for consultation & management of altered bowel habits and abdominal bloating.  Patient reports that he has been experiencing abdominal bloating, particularly postprandial associated with variable stool consistency anywhere from normal caliber to pencil thin and sometimes hard.  He underwent colonoscopy in 4/21 which revealed polyps and diverticulosis of the sigmoid colon as well as internal hemorrhoids.  Patient also had rectal pain, was treated with hemorrhoid cream.  He is not concerned about rectal pain today.  Patient is also diagnosed with cirrhosis of liver based on CT chest and followed by ultrasound liver.  He does have new onset of mild thrombocytopenia, 142 in 4/21.  Patient underwent work-up for secondary liver disease, found to have ANA positive and elevated ferritin <500  Follow-up visit 10/06/2020 Patient is diagnosed with adenocarcinoma of pancreas, stage I, resectable, planning to start neoadjuvant chemo.  He continues to have abdominal bloating and loose stools, primarily postprandial.  His weight is stable.  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  He is currently undergoing neoadjuvant chemo for stage I pancreatic adenocarcinoma.  Patient is also scheduled to undergo pancreaticobiliary surgery in September.  Fortunately patient's weight has been stable in last 3 months.  He finds Creon to be expensive and was denied from patient assistance program.  However, he manages to take Creon 4  to 5 pills daily.  He wants to know if he can take over-the-counter digestive enzymes that has low-dose lipase.  He reports having abdominal bloating as well as up to 6 soft bowel movements every time he urinates.  Patient is also consuming whole milk daily.  He is accompanied by his wife today  NSAIDs: None  Antiplts/Anticoagulants/Anti thrombotics: None  GI Procedures:  Upper endoscopy 11/29/2018 - Normal esophagus. - No endoscopic esophageal abnormality to explain patient's dysphagia. Dilation attempted, but aborted due to resistance with [place] and known Zenker's diverticulum. - Multiple gastric polyps. Biopsied. - Normal examined duodenum.  Surgical [P], gastric polyp BX - HYPERPLASTIC POLYP. - WARTHIN-STARRY IS NEGATIVE FOR HELICOBACTER PYLORI. - NO INTESTINAL METAPLASIA, DYSPLASIA, OR MALIGNANCY.  Colonoscopy 10/09/2019 - Nine 3 to 6 mm polyps in the sigmoid colon, in the ascending colon and in the cecum, removed with a cold snare. Resected and retrieved. - Diverticulosis in the sigmoid colon. - Non-bleeding internal hemorrhoids.  Diagnosis Surgical [P], colon, ascending and cecum, sigmoid, polyp (9) - TUBULAR ADENOMA(S) WITHOUT HIGH-GRADE DYSPLASIA OR MALIGNANCY - INFLAMMATORY POLYP(S)  Past Medical History:  Diagnosis Date   Allergy    Aneurysm of abdominal aorta ([facility])    Basal cell carcinoma    right temple Dr. Evorn Gong 12/2018   Chicken pox    Colon polyps    COVID-19 virus infection 03/16/2020   GERD (gastroesophageal reflux disease)    history of prostate CA 2006   Hyperlipidemia    Leg cramps    Neuropathy    Osteoporosis     Past Surgical History:  Procedure Laterality Date  CATARACT EXTRACTION Right    CATARACT EXTRACTION W/PHACO Left 12/26/2017   Procedure: CATARACT EXTRACTION PHACO AND INTRAOCULAR LENS PLACEMENT ([place])  COMPLICATED LEFT;  Surgeon: Leandrew Koyanagi, MD;  Location: [facility];  Service: Ophthalmology;  Laterality: Left;   [place]   colonoscopy     ESOPHAGOGASTRODUODENOSCOPY (EGD) WITH PROPOFOL     EYE MUSCLE SURGERY Left    PORTA CATH INSERTION N/A 10/19/2020   Procedure: PORTA CATH INSERTION;  Surgeon: Algernon Huxley, MD;  Location: [facility];  Service: Cardiovascular;  Laterality: N/A;   TONSILLECTOMY AND ADENOIDECTOMY      Current Outpatient Medications:    acetaminophen (TYLENOL) 325 MG tablet, Take 650 mg by mouth every 6 (six) hours as needed., Disp: , Rfl:    ALPRAZolam (XANAX) 0.25 MG tablet, TAKE 0.5 TABLETS (0.125 MG TOTAL) BY MOUTH AT BEDTIME AS NEEDED FOR ANXIETY OR SLEEP., Disp: 20 tablet, Rfl: 5   Calcium Carb-Cholecalciferol (CALCIUM 1000 + D PO), Take 1 tablet by mouth daily. , Disp: , Rfl:    Cholecalciferol 50 MCG (2000 UT) CAPS, Take by mouth., Disp: , Rfl:    CREON 36000-114000 units CPEP capsule, Take by mouth., Disp: , Rfl:    Denosumab (PROLIA [place]), Inject into the skin every 6 (six) months., Disp: , Rfl:    fluticasone (FLONASE) 50 MCG/ACT nasal spray, Place 2 sprays into both nostrils daily., Disp: 16 g, Rfl: 6   glucosamine-chondroitin 500-400 MG tablet, Take 1 tablet by mouth daily., Disp: , Rfl:    lidocaine-prilocaine (EMLA) cream, Apply to affected area once, Disp: 30 g, Rfl: 3   loratadine (CLARITIN) 10 MG tablet, Take 10 mg by mouth daily as needed. , Disp: , Rfl:    LORazepam (ATIVAN) 0.5 MG tablet, Take 1 tablet (0.5 mg total) by mouth every 6 (six) hours as needed (Nausea or vomiting)., Disp: 30 tablet, Rfl: 0   pantoprazole (PROTONIX) 40 MG tablet, TAKE 1 TABLET BY MOUTH EVERY DAY, Disp: 30 tablet, Rfl: 0   pramoxine-hydrocortisone (PROCTOCREAM-HC) 1-1 % rectal cream, PLACE 1 APPLICATION RECTALLY 2 (TWO) TIMES DAILY., Disp: 30 g, Rfl: 0   prochlorperazine (COMPAZINE) 5 MG tablet, Take 5 mg by mouth every 6 (six) hours as needed for nausea or vomiting., Disp: , Rfl:    Ranibizumab (LUCENTIS IO), Inject into the eye as needed. As needed every three months.,  Disp: , Rfl:    traZODone (DESYREL) 50 MG tablet, TAKE 0.5 TABLETS (25 MG TOTAL) BY MOUTH AT BEDTIME. MAY INCREASE TO FULL TABLET IF NEEDED, Disp: 90 tablet, Rfl: 0   vitamin B-12 (CYANOCOBALAMIN) 1000 MCG tablet, Take 1,000 mcg by mouth in the morning and at bedtime., Disp: , Rfl:    Family History  Problem Relation Age of Onset   Stroke Mother    Arthritis Father    Heart disease Father    Brain cancer Brother        1/2 brother (possibly related to mother NOT related to patient)   Colon cancer Neg Hx    Esophageal cancer Neg Hx    Pancreatic cancer Neg Hx    Stomach cancer Neg Hx    Liver disease Neg Hx      Social History   Tobacco Use   Smoking status: Never   Smokeless tobacco: Never  Vaping Use   Vaping Use: Never used  Substance Use Topics   Alcohol use: No   Drug use: No    Allergies as of 02/02/2021 -  Review Complete 02/02/2021  Allergen Reaction Noted   Kenalog [triamcinolone acetonide]  12/13/2012    Review of Systems:    All systems reviewed and negative except where noted in HPI.   Physical Exam:  BP 127/80 (BP Location: Left Arm, Patient Position: Sitting, Cuff Size: Normal)   Pulse 80   Temp 97.8 F (36.6 C) (Oral)   Ht '6\' 4"'$  (1.93 m)   Wt 184 lb (83.5 kg)   BMI 22.40 kg/m  No LMP for male patient.  General:   Alert,  Well-developed, well-nourished, pleasant and cooperative in NAD Head:  Normocephalic and atraumatic. Eyes:  Sclera clear, no icterus.   Conjunctiva pink. Ears:  Normal auditory acuity. Nose:  No deformity, discharge, or lesions. Mouth:  No deformity or lesions,oropharynx pink & moist. Neck:  Supple; no masses or thyromegaly. Lungs:  Respirations even and unlabored.  Clear throughout to auscultation.   No wheezes, crackles, or rhonchi. No acute distress. Heart:  Regular rate and rhythm; no murmurs, clicks, rubs, or gallops. Abdomen:  Normal bowel sounds. Soft, non-tender and mildly distended, tympanic without masses,  hepatosplenomegaly or hernias noted.  No guarding or rebound tenderness.   Rectal: Not performed Msk:  Symmetrical without gross deformities. Good, equal movement & strength bilaterally. Pulses:  Normal pulses noted. Extremities:  No clubbing or edema.  No cyanosis. Neurologic:  Alert and oriented x3;  grossly normal neurologically. Skin:  Intact without significant lesions or rashes. No jaundice. Psych:  Alert and cooperative. Normal mood and affect.  Imaging Studies: Reviewed  Assessment and Plan:   BALRAJ IFFT is a 87 y.o. Caucasian male with stage I pancreatic adenocarcinoma on neoadjuvant chemo and ?  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  Patient can also take over-the-counter digestive enzymes as he finds Creon to be very expensive.  He said he would apply for patient assistance program 1 more time.  Explained to the patient that he will stay on pancreatic enzymes indefinitely and may need higher dose after pancreatic surgery Patient already had a colonoscopy in 09/2019 which was unremarkable except for polyps and sigmoid diverticulosis and internal hemorrhoids.  Random biopsies were not performed to rule out microscopic colitis H. pylori breath test is negative Also, advised him to try lactose-free diet given ongoing bloating and loose stools which he is at risk given his advanced age  Sore throat, possible heartburn Advised to increase Protonix 40 mg to twice daily EGD revealed possible Zenker's diverticulum in the past, otherwise normal esophagus Patient will contact me if his upper GI symptoms are not improving  ?Cirrhosis of liver: Ultrasound liver revealed nodularity of the liver with no evidence of portal vein thrombosis.  Recent CT revealed lobulated liver surface only.  If at all he has cirrhosis, etiology is likely  secondary to NASH, appears to be well compensated child A LFTs normal, no evidence of hypoalbuminemia or ascites Secondary liver disease work-up is negative except for positive ANA, viral hepatitis panel negative Mild thrombocytopenia with mild anemia No evidence of varices LFTs are normal Recommend low-sodium diet HCC screening: No evidence of liver lesions based on recent CT scan in 3/22   Follow up as needed   Cephas Darby, MD

## 2021-02-08 ENCOUNTER — Other Ambulatory Visit: Payer: Self-pay

## 2021-02-08 ENCOUNTER — Inpatient Hospital Stay: Payer: PPO

## 2021-02-08 NOTE — Progress Notes (Signed)
Nutrition Follow-up:  Patient with stage I pancreatic cancer.  Patient planning surgery on 9/8 at [facility].    Called patient for nutrition follow-up.  Spoke with wife as patient was at the dentist, had an opening.  Wife says that patient's weight is stable. Has gotten the Boost [facility] shakes (strawberry) and added ice cream to shake last night with few strawberries.      Medications: reviewed  Labs: reviewed  Anthropometrics:   Weight stable at 184 lb on 8/16  180 lb on 8/1  UBW of 192 lb about 1 year ago   NUTRITION DIAGNOSIS: Unintentional weight loss stable   INTERVENTION:  Continue high calorie, high protein foods to promote weight maintenance/stability Continue pancreatic enzymes    MONITORING, EVALUATION, GOAL: weight trends, intake   NEXT VISIT: as needed  George Owens B. Zenia Resides, [place], [employer] Registered Dietitian [phone] (mobile)

## 2021-02-11 ENCOUNTER — Ambulatory Visit
Admission: RE | Admit: 2021-02-11 | Discharge: 2021-02-11 | Disposition: A | Discharge status: Home / Self Care | Payer: PPO | Source: Ambulatory Visit | Attending: Cardiothoracic Surgery | Admitting: Cardiothoracic Surgery

## 2021-02-11 ENCOUNTER — Ambulatory Visit (INDEPENDENT_AMBULATORY_CARE_PROVIDER_SITE_OTHER): Payer: PPO | Admitting: Physician Assistant

## 2021-02-11 ENCOUNTER — Encounter: Payer: Self-pay | Admitting: Oncology

## 2021-02-11 ENCOUNTER — Other Ambulatory Visit: Payer: Self-pay

## 2021-02-11 VITALS — BP 120/63 | HR 77 | Resp 20 | Ht 76.0 in | Wt 181.2 lb

## 2021-02-11 DIAGNOSIS — R911 Solitary pulmonary nodule: Secondary | ICD-10-CM | POA: Diagnosis not present

## 2021-02-11 DIAGNOSIS — I712 Thoracic aortic aneurysm, without rupture, unspecified: Secondary | ICD-10-CM

## 2021-02-11 DIAGNOSIS — I700 Atherosclerosis of aorta: Secondary | ICD-10-CM | POA: Diagnosis not present

## 2021-02-11 DIAGNOSIS — I7121 Aneurysm of the ascending aorta, without rupture: Secondary | ICD-10-CM

## 2021-02-11 NOTE — Progress Notes (Signed)
[address]       [place] [address]             [id number]     CARDIOTHORACIC SURGERY CONSULTATION REPORT  Referring Provider is Crecencio Mc, MD Primary Cardiologist is No primary care provider on file. PCP is Crecencio Mc, MD  Chief Complaint  Patient presents with   Thoracic Aortic Aneurysm    new patient consultation, Chest CT 11/10    HPI:  The patient was in his usual state of health until recently when he presented with weight loss.  He underwent CT scan of the chest to evaluate for lung nodule.  This demonstrated an incidentally discovered ascending aortic aneurysm measured approximately 4.7 cm in maximal diameter.  He denies any chest pain.  He has no family history of aneurysm or personal hinge history of aneurysm.  He does not have high blood pressure.  He does not smoke.  He presents to the office for surveillance of his ascending thoracic aortic aneurysm. He informs me that he is going to have laparoscopic surgery on his pancreas for pancreatic cancer. He also mentioned his other doctors know about his thyroid nodule. He is having his surgery done at [facility].   Past Medical History:  Diagnosis Date   Allergy    Basal cell carcinoma    right temple Dr. Evorn Gong 12/2018   Chicken pox    Colon polyps    COVID-19 virus infection 03/16/2020   GERD (gastroesophageal reflux disease)    history of prostate CA 2006   Hyperlipidemia    Leg cramps    Neuropathy    Osteoporosis     Past Surgical History:  Procedure Laterality Date   CATARACT EXTRACTION Right    CATARACT EXTRACTION W/PHACO Left 12/26/2017   Procedure: CATARACT EXTRACTION PHACO AND INTRAOCULAR LENS PLACEMENT ([place])  COMPLICATED LEFT;  Surgeon: Leandrew Koyanagi, MD;  Location: [place];  Service: Ophthalmology;  Laterality: Left;  [place]   colonoscopy     EYE MUSCLE SURGERY Left    TONSILLECTOMY AND ADENOIDECTOMY      Family History  Problem  Relation Age of Onset   Stroke Mother    Arthritis Father    Heart disease Father    Brain cancer Brother        1/2 brother (possibly related to mother NOT related to patient)   Colon cancer Neg Hx    Esophageal cancer Neg Hx    Pancreatic cancer Neg Hx    Stomach cancer Neg Hx    Liver disease Neg Hx     Social History   Socioeconomic History   Marital status: Married    Spouse name: Eloise   Number of children: 4   Years of education: Not on file   Highest education level: Not on file  Occupational History   Occupation: retired  Tobacco Use   Smoking status: Never Smoker   Smokeless tobacco: Never Used  Scientific laboratory technician Use: Never used  Substance and Sexual Activity   Alcohol use: No   Drug use: No   Sexual activity: Yes    Partners: Female  Other Topics Concern   Not on file  Social History Narrative   Lives with wife in a 5 story retirement home.  He lives on the first floor.  4 year college degree.  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  Current Outpatient Medications on File Prior to Visit  Medication Sig Dispense Refill   acetaminophen (TYLENOL) 325 MG tablet Take 650 mg by mouth every 6 (six) hours as needed.     ALPRAZolam (XANAX) 0.25 MG tablet TAKE 0.5 TABLETS (0.125 MG TOTAL) BY MOUTH AT BEDTIME AS NEEDED FOR ANXIETY OR SLEEP. 20 tablet 5   Calcium Carb-Cholecalciferol (CALCIUM 1000 + D PO) Take 1 tablet by mouth daily.      Cholecalciferol 50 MCG (2000 UT) CAPS Take by mouth.     CREON 36000-114000 units CPEP capsule Take by mouth.     Denosumab (PROLIA [place]) Inject into the skin every 6 (six) months.     glucosamine-chondroitin 500-400 MG tablet Take 1 tablet by mouth daily.     lidocaine-prilocaine (EMLA) cream Apply to affected area once 30 g 3   loratadine (CLARITIN) 10 MG tablet Take 10 mg by mouth daily as needed.      pantoprazole (PROTONIX) 40 MG tablet TAKE 1 TABLET BY MOUTH EVERY DAY 30 tablet 0    pramoxine-hydrocortisone (PROCTOCREAM-HC) 1-1 % rectal cream PLACE 1 APPLICATION RECTALLY 2 (TWO) TIMES DAILY. 30 g 0   Ranibizumab (LUCENTIS IO) Inject into the eye as needed. As needed every three months.     traZODone (DESYREL) 50 MG tablet TAKE 0.5 TABLETS (25 MG TOTAL) BY MOUTH AT BEDTIME. MAY INCREASE TO FULL TABLET IF NEEDED 90 tablet 0   vitamin B-12 (CYANOCOBALAMIN) 1000 MCG tablet Take 1,000 mcg by mouth in the morning and at bedtime.     No current facility-administered medications on file prior to visit.         Physical Exam:  Vitals:    Today's Vitals   02/11/21 1122  BP: 120/63  Pulse: 77  Resp: 20  SpO2: 98%  Weight: 181 lb 3.2 oz (82.2 kg)  Height: '6\' 4"'$  (1.93 m)   Body mass index is 22.06 kg/m.   Cor: RRR, no murmur Pulm: CTA bilaterally and in all fields Ext: no edema   Diagnostic Tests:  CLINICAL DATA:  Thoracic aortic aneurysm.  Pancreatic cancer.   EXAM: CT CHEST WITHOUT CONTRAST   TECHNIQUE: Multidetector CT imaging of the chest was performed following the standard protocol without IV contrast.   COMPARISON:  PET, 09/29/2020, CT abdomen pelvis 09/11/2020 and CT chest 04/29/2020.   FINDINGS: Cardiovascular: Right IJ Port-A-Cath terminates in the right atrium. Atherosclerotic calcification of the aorta, aortic valve and coronary arteries. Ascending aorta measures 4.5 cm. Heart size normal. No pericardial effusion.   Mediastinum/Nodes: Low-attenuation thyroid isthmus nodule measures 1.8 cm, similar. No pathologically enlarged mediastinal or axillary lymph nodes. Hilar regions are difficult to definitively evaluate without IV contrast. Esophagus is grossly unremarkable.   Lungs/Pleura: Biapical pleuroparenchymal scarring. No suspicious pulmonary nodules. No pleural fluid. Airway is unremarkable.   Upper Abdomen: Liver margin is irregular with hypertrophy of the left hepatic lobe. Stone in the gallbladder. Visualized portions of the  adrenal glands, kidneys and spleen are unremarkable. Pancreatic tail mass measures approximately 2.1 x 2.8 cm, similar to 09/11/2020, 2.0 x 2.6 cm. Visualized portions of the stomach and bowel are unremarkable.   Musculoskeletal: Degenerative changes in the spine. No worrisome lytic or sclerotic lesions.   IMPRESSION: 1. Ascending aortic aneurysm, stable. Ascending thoracic aortic aneurysm. Recommend semi-annual imaging followup by CTA or MRA and referral to cardiothoracic surgery if not already obtained. This recommendation follows 2010 ACCF/AHA/AATS/ACR/ASA/SCA/SCAI/SIR/STS/SVM Guidelines for the Diagnosis and Management of Patients With Thoracic Aortic Disease. Circulation. 2010;  121: [id number]. Aortic aneurysm NOS (ICD10-I71.9). 2. Pancreatic tail mass is similar to minimally enlarged from 09/11/2020. 3. 1.5 cm thyroid isthmus nodule. Recommend thyroid ultrasound. (Ref: J Am Coll Radiol. 2015 Feb;12(2): 143-50). 4. Cirrhosis. 5. Cholelithiasis. 6. Aortic atherosclerosis (ICD10-I70.0). Coronary artery calcification.     Electronically Signed   By: Lorin Picket M.D.   On: 02/11/2021 11:31     Impression:  87 year old man with a 4.5 cm ascending aortic aneurysm it is unclear how long this has been present.  He plans to have pancreatic surgery with [facility] for cancer. As far as they know it has not spread. His BP has been well controlled at home. He continues to walk 1-2 miles a day without symptoms.   Plan:  Follow-up in 1 year with repeat CT scan Continue to follow blood pressure control If we need to send the CT scan to [facility] before his surgery, they may contact us.    I spent in excess of 30 minutes during the conduct of this office consultation and >50% of this time involved direct face-to-face encounter with the patient for counseling and/or coordination of their care.          Level 3 Office Consult = 40 minutes         Level 4 Office Consult = 60 minutes          Level 5 Office Consult = 80 minutes  Nicholes Rough, PA-C

## 2021-02-12 DIAGNOSIS — G4700 Insomnia, unspecified: Secondary | ICD-10-CM | POA: Diagnosis not present

## 2021-02-12 DIAGNOSIS — R9431 Abnormal electrocardiogram [ECG] [EKG]: Secondary | ICD-10-CM | POA: Diagnosis not present

## 2021-02-12 DIAGNOSIS — E785 Hyperlipidemia, unspecified: Secondary | ICD-10-CM | POA: Diagnosis not present

## 2021-02-12 DIAGNOSIS — E559 Vitamin D deficiency, unspecified: Secondary | ICD-10-CM | POA: Diagnosis not present

## 2021-02-12 DIAGNOSIS — I712 Thoracic aortic aneurysm, without rupture: Secondary | ICD-10-CM | POA: Diagnosis not present

## 2021-02-12 DIAGNOSIS — R54 Age-related physical debility: Secondary | ICD-10-CM | POA: Diagnosis not present

## 2021-02-12 DIAGNOSIS — M48061 Spinal stenosis, lumbar region without neurogenic claudication: Secondary | ICD-10-CM | POA: Diagnosis not present

## 2021-02-12 DIAGNOSIS — Z01818 Encounter for other preprocedural examination: Secondary | ICD-10-CM | POA: Diagnosis not present

## 2021-02-12 DIAGNOSIS — C251 Malignant neoplasm of body of pancreas: Secondary | ICD-10-CM | POA: Diagnosis not present

## 2021-02-12 DIAGNOSIS — K219 Gastro-esophageal reflux disease without esophagitis: Secondary | ICD-10-CM | POA: Diagnosis not present

## 2021-02-12 DIAGNOSIS — K7460 Unspecified cirrhosis of liver: Secondary | ICD-10-CM | POA: Diagnosis not present

## 2021-02-12 DIAGNOSIS — D649 Anemia, unspecified: Secondary | ICD-10-CM | POA: Diagnosis not present

## 2021-02-12 DIAGNOSIS — Z8546 Personal history of malignant neoplasm of prostate: Secondary | ICD-10-CM | POA: Diagnosis not present

## 2021-02-12 DIAGNOSIS — I517 Cardiomegaly: Secondary | ICD-10-CM | POA: Diagnosis not present

## 2021-02-12 DIAGNOSIS — Q8901 Asplenia (congenital): Secondary | ICD-10-CM | POA: Diagnosis not present

## 2021-02-12 DIAGNOSIS — Z79899 Other long term (current) drug therapy: Secondary | ICD-10-CM | POA: Diagnosis not present

## 2021-02-15 ENCOUNTER — Inpatient Hospital Stay: Payer: PPO

## 2021-02-15 ENCOUNTER — Ambulatory Visit: Payer: PPO

## 2021-02-15 ENCOUNTER — Inpatient Hospital Stay (HOSPITAL_BASED_OUTPATIENT_CLINIC_OR_DEPARTMENT_OTHER): Payer: PPO | Admitting: Oncology

## 2021-02-15 ENCOUNTER — Encounter: Payer: Self-pay | Admitting: Oncology

## 2021-02-15 VITALS — BP 114/64 | HR 74 | Temp 97.8°F | Resp 16 | Ht 76.0 in | Wt 182.3 lb

## 2021-02-15 DIAGNOSIS — C251 Malignant neoplasm of body of pancreas: Secondary | ICD-10-CM | POA: Diagnosis not present

## 2021-02-15 DIAGNOSIS — Z5111 Encounter for antineoplastic chemotherapy: Secondary | ICD-10-CM | POA: Diagnosis not present

## 2021-02-15 LAB — CBC WITH DIFFERENTIAL/PLATELET
Abs Immature Granulocytes: 0.02 10*3/uL (ref 0.00–0.07)
Basophils Absolute: 0 10*3/uL (ref 0.0–0.1)
Basophils Relative: 1 %
Eosinophils Absolute: 0.2 10*3/uL (ref 0.0–0.5)
Eosinophils Relative: 4 %
HCT: 40.9 % (ref 39.0–52.0)
Hemoglobin: 13.6 g/dL (ref 13.0–17.0)
Immature Granulocytes: 1 %
Lymphocytes Relative: 19 %
Lymphs Abs: 0.7 10*3/uL (ref 0.7–4.0)
MCH: 31.5 pg (ref 26.0–34.0)
MCHC: 33.3 g/dL (ref 30.0–36.0)
MCV: 94.7 fL (ref 80.0–100.0)
Monocytes Absolute: 0.4 10*3/uL (ref 0.1–1.0)
Monocytes Relative: 10 %
Neutro Abs: 2.5 10*3/uL (ref 1.7–7.7)
Neutrophils Relative %: 65 %
Platelets: 180 10*3/uL (ref 150–400)
RBC: 4.32 MIL/uL (ref 4.22–5.81)
RDW: 13.5 % (ref 11.5–15.5)
WBC: 3.8 10*3/uL — ABNORMAL LOW (ref 4.0–10.5)
nRBC: 0 % (ref 0.0–0.2)

## 2021-02-15 LAB — COMPREHENSIVE METABOLIC PANEL
ALT: 44 U/L (ref 0–44)
AST: 40 U/L (ref 15–41)
Albumin: 4.1 g/dL (ref 3.5–5.0)
Alkaline Phosphatase: 67 U/L (ref 38–126)
Anion gap: 9 (ref 5–15)
BUN: 26 mg/dL — ABNORMAL HIGH (ref 8–23)
CO2: 29 mmol/L (ref 22–32)
Calcium: 9.1 mg/dL (ref 8.9–10.3)
Chloride: 100 mmol/L (ref 98–111)
Creatinine, Ser: 0.92 mg/dL (ref 0.61–1.24)
GFR, Estimated: >60 mL/min (ref >60)
Glucose, Bld: 170 mg/dL — ABNORMAL HIGH (ref 70–99)
Potassium: 4.1 mmol/L (ref 3.5–5.1)
Sodium: 138 mmol/L (ref 135–145)
Total Bilirubin: 0.5 mg/dL (ref 0.3–1.2)
Total Protein: 7 g/dL (ref 6.5–8.1)

## 2021-02-15 NOTE — Progress Notes (Signed)
Pt having surgeyr 9/8. Having to take 2 creon with each meal. Also he had bloating with boost. He states that dr Marius Ditch thinkg he is lactose intolerant but pt says he feels same way whether he is drinking something,  eating something so he does not know .

## 2021-02-15 NOTE — Progress Notes (Signed)
Hematology/Oncology Consult note 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  He was having symptoms of Abdominal pain and reflux and therefore underwent a CT abdomen and pelvis with contrast on 09/11/2020 which showed a hypointense ill-defined mass in the pancreatic body up to 25 mm.  No extrapancreatic infiltrative density about the aorta or proximal [name].  No local regional adenopathy or distant metastatic disease.  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  This was followed by an EUS at [facility] which showed a hypoechoic mass measuring 1.8 x 1.6 cm in the pancreatic body.  Endosonographic borders poorly defined.  Upstream pancreatic duct dilatation.  No abnormal appearing lymph nodes.  Endosonographic imaging of the liver showed no lesion.  Biopsy showed adenocarcinoma.   PET scan showed 2.7 x 2 cm ovoid mass in the body of the pancreas with an SUV of 7.1.  18 mm hypermetabolic nodule in the isthmus of the thyroid gland.  No evidence of local regional adenopathy or distant metastatic disease.   Patient was evaluated by Dr. Hyman Hopes at [facility] for consideration of surgery and has been deemed to be upfront resectable surgical candidate.  However plan was to offer him neoadjuvant chemotherapy for 3 months to control micrometastatic disease before proceeding with definitive surgery.  If ultimately patient decides not to proceed with surgery then radiation at [facility] also remains  an option.   Patient received 8 doses of gemcitabine and Abraxane chemotherapy 1 week on 1 week off due to neutropenia and thrombocytopenia.  Plan is to proceed with definitive surgery at this time as patient was also getting recurrent UTIs on chemotherapy.  Depending on his recovery after surgery further adjuvant chemotherapy can be considered  Interval history-patient has not had any recent UTIs.  He did have sensation of significant bloating and was also seen by Dr. Marius Ditch.  He is back on Creon but has not tried a lactose-free diet yet.  Bloating sensation is presently improved.  Bowel movements are regular.  Patient did not report an episode of bright red blood per rectum which he noticed while he was wiping himself during bath this morning.  ECOG PS- 1 Pain scale- 0   Review of systems- Review of Systems  Constitutional:  Positive for malaise/fatigue. Negative for chills, fever and weight loss.  HENT:  Negative for congestion, ear discharge and nosebleeds.   Eyes:  Negative for blurred vision.  Respiratory:  Negative for cough, hemoptysis, sputum production, shortness of breath and wheezing.   Cardiovascular:  Negative for chest pain, palpitations, orthopnea and claudication.  Gastrointestinal:  Positive for blood in stool. Negative for abdominal pain, constipation, diarrhea, heartburn, melena, nausea and vomiting.  Genitourinary:  Negative for dysuria, flank pain, frequency, hematuria and urgency.  Musculoskeletal:  Negative for back pain, joint pain and myalgias.  Skin:  Negative for rash.  Neurological:  Negative for dizziness, tingling, focal weakness, seizures, weakness and headaches.  Endo/Heme/Allergies:  Does not bruise/bleed easily.  Psychiatric/Behavioral:  Negative for depression and suicidal ideas. The patient does  not have insomnia.      Allergies  Allergen Reactions   Kenalog [Triamcinolone Acetonide]     Blindness X 3 Days     Past Medical History:  Diagnosis Date    Allergy    Aneurysm of abdominal aorta (HCC)    Basal cell carcinoma    right temple Dr. Evorn Gong 12/2018   Chicken pox    Colon polyps    COVID-19 virus infection 03/16/2020   GERD (gastroesophageal reflux disease)    history of prostate CA 2006   Hyperlipidemia    Leg cramps    Neuropathy    Osteoporosis      Past Surgical History:  Procedure Laterality Date   CATARACT EXTRACTION Right    CATARACT EXTRACTION W/PHACO Left 12/26/2017   Procedure: CATARACT EXTRACTION PHACO AND INTRAOCULAR LENS PLACEMENT ([place])  COMPLICATED LEFT;  Surgeon: Leandrew Koyanagi, MD;  Location: [place];  Service: Ophthalmology;  Laterality: Left;  [place]   colonoscopy     ESOPHAGOGASTRODUODENOSCOPY (EGD) WITH PROPOFOL     EYE MUSCLE SURGERY Left    PORTA CATH INSERTION N/A 10/19/2020   Procedure: PORTA CATH INSERTION;  Surgeon: Algernon Huxley, MD;  Location: [place] [facility];  Service: Cardiovascular;  Laterality: N/A;   TONSILLECTOMY AND ADENOIDECTOMY      Social History   Socioeconomic History   Marital status: Married    Spouse name: Eloise   Number of children: 4   Years of education: Not on file   Highest education level: Not on file  Occupational History   Occupation: retired  Tobacco Use   Smoking status: Never   Smokeless tobacco: Never  Vaping Use   Vaping Use: Never used  Substance and Sexual Activity   Alcohol use: No   Drug use: No   Sexual activity: Yes    Partners: Female  Other Topics Concern   Not on file  Social History Narrative   Lives with wife in a 5 story retirement home.  He lives on the first floor.  4 year college degree.  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     Social Determinants of Health   Financial Resource Strain: Low Risk    Difficulty of Paying Living Expenses: Not hard at all  Food Insecurity: Not on file  Transportation Needs: No Transportation Needs   Lack of Transportation (Medical): No    Lack of Transportation (Non-Medical): No  Physical Activity: Not on file  Stress: No Stress Concern Present   Feeling of Stress : Not at all  Social Connections: Unknown   Frequency of Communication with Friends and Family: Not on file   Frequency of Social Gatherings with Friends and Family: Not on file   Attends Religious Services: Not on Electrical engineer or Organizations: Not on file   Attends Archivist Meetings: Not on file   Marital Status: Married  Human resources officer Violence: Not on file    Family History  Problem Relation Age of Onset   Stroke Mother    Arthritis Father    Heart disease Father    Brain cancer Brother        1/2 brother (possibly related to mother NOT related to patient)   Colon cancer Neg Hx    Esophageal cancer Neg Hx    Pancreatic cancer Neg Hx    Stomach cancer Neg Hx    Liver disease Neg Hx  Current Outpatient Medications:    acetaminophen (TYLENOL) 325 MG tablet, Take 650 mg by mouth every 6 (six) hours as needed., Disp: , Rfl:    ALPRAZolam (XANAX) 0.25 MG tablet, TAKE 0.5 TABLETS (0.125 MG TOTAL) BY MOUTH AT BEDTIME AS NEEDED FOR ANXIETY OR SLEEP., Disp: 20 tablet, Rfl: 5   Calcium Carb-Cholecalciferol (CALCIUM 1000 + D PO), Take 1 tablet by mouth daily. , Disp: , Rfl:    Cholecalciferol 50 MCG (2000 UT) CAPS, Take by mouth., Disp: , Rfl:    CREON 36000-114000 units CPEP capsule, Take by mouth. 2 pills with meal, 1 pill with snack, Disp: , Rfl:    Denosumab (PROLIA [place]), Inject into the skin every 6 (six) months., Disp: , Rfl:    glucosamine-chondroitin 500-400 MG tablet, Take 1 tablet by mouth daily., Disp: , Rfl:    lidocaine-prilocaine (EMLA) cream, Apply to affected area once, Disp: 30 g, Rfl: 3   loratadine (CLARITIN) 10 MG tablet, Take 10 mg by mouth daily as needed. , Disp: , Rfl:    pantoprazole (PROTONIX) 40 MG tablet, TAKE 1 TABLET BY MOUTH EVERY DAY (Patient taking differently: 40 mg 2 (two) times daily.),  Disp: 30 tablet, Rfl: 0   phenazopyridine (PYRIDIUM) 95 MG tablet, Take 95 mg by mouth in the morning and at bedtime., Disp: , Rfl:    pramoxine-hydrocortisone (PROCTOCREAM-HC) 1-1 % rectal cream, PLACE 1 APPLICATION RECTALLY 2 (TWO) TIMES DAILY., Disp: 30 g, Rfl: 0   Ranibizumab (LUCENTIS IO), Inject into the eye as needed. As needed every three months., Disp: , Rfl:    traZODone (DESYREL) 50 MG tablet, TAKE 0.5 TABLETS (25 MG TOTAL) BY MOUTH AT BEDTIME. MAY INCREASE TO FULL TABLET IF NEEDED, Disp: 90 tablet, Rfl: 0   vitamin B-12 (CYANOCOBALAMIN) 1000 MCG tablet, Take 1,000 mcg by mouth in the morning and at bedtime., Disp: , Rfl:   Physical exam:  Vitals:   02/15/21 1010  BP: (!) 144/66  Pulse: 74  Resp: 16  Temp: 97.8 F (36.6 C)  TempSrc: Tympanic  Weight: 182 lb 4.8 oz (82.7 kg)  Height: '6\' 4"'$  (1.93 m)   Physical Exam Constitutional:      General: He is not in acute distress. Cardiovascular:     Rate and Rhythm: Normal rate and regular rhythm.     Heart sounds: Normal heart sounds.  Pulmonary:     Effort: Pulmonary effort is normal.     Breath sounds: Normal breath sounds.  Abdominal:     General: Bowel sounds are normal.     Palpations: Abdomen is soft.  Skin:    General: Skin is warm and dry.  Neurological:     Mental Status: He is alert and oriented to person, place, and time.     CMP Latest Ref Rng & Units 02/15/2021  Glucose 70 - 99 mg/dL 170(H)  BUN 8 - 23 mg/dL 26(H)  Creatinine 0.61 - 1.24 mg/dL 0.92  Sodium 135 - 145 mmol/L 138  Potassium 3.5 - 5.1 mmol/L 4.1  Chloride 98 - 111 mmol/L 100  CO2 22 - 32 mmol/L 29  Calcium 8.9 - 10.3 mg/dL 9.1  Total Protein 6.5 - 8.1 g/dL 7.0  Total Bilirubin 0.3 - 1.2 mg/dL 0.5  Alkaline Phos 38 - 126 U/L 67  AST 15 - 41 U/L 40  ALT 0 - 44 U/L 44   CBC Latest Ref Rng & Units 02/15/2021  WBC 4.0 - 10.5 K/uL 3.8(L)  Hemoglobin 13.0 - 17.0 g/dL 13.6  Hematocrit 39.0 - 52.0 % 40.9  Platelets 150 - 400 K/uL 180    No  images are attached to the encounter.  CT CHEST WO CONTRAST  Result Date: 02/11/2021 CLINICAL DATA:  Thoracic aortic aneurysm.  Pancreatic cancer. EXAM: CT CHEST WITHOUT CONTRAST TECHNIQUE: Multidetector CT imaging of the chest was performed following the standard protocol without IV contrast. COMPARISON:  PET, 09/29/2020, CT abdomen pelvis 09/11/2020 and CT chest 04/29/2020. FINDINGS: Cardiovascular: Right IJ Port-A-Cath terminates in the right atrium. Atherosclerotic calcification of the aorta, aortic valve and coronary arteries. Ascending aorta measures 4.5 cm. Heart size normal. No pericardial effusion. Mediastinum/Nodes: Low-attenuation thyroid isthmus nodule measures 1.8 cm, similar. No pathologically enlarged mediastinal or axillary lymph nodes. Hilar regions are difficult to definitively evaluate without IV contrast. Esophagus is grossly unremarkable. Lungs/Pleura: Biapical pleuroparenchymal scarring. No suspicious pulmonary nodules. No pleural fluid. Airway is unremarkable. Upper Abdomen: Liver margin is irregular with hypertrophy of the left hepatic lobe. Stone in the gallbladder. Visualized portions of the adrenal glands, kidneys and spleen are unremarkable. Pancreatic tail mass measures approximately 2.1 x 2.8 cm, similar to 09/11/2020, 2.0 x 2.6 cm. Visualized portions of the stomach and bowel are unremarkable. Musculoskeletal: Degenerative changes in the spine. No worrisome lytic or sclerotic lesions. IMPRESSION: 1. Ascending aortic aneurysm, stable. Ascending thoracic aortic aneurysm. Recommend semi-annual imaging followup by CTA or MRA and referral to cardiothoracic surgery if not already obtained. This recommendation follows 2010 ACCF/AHA/AATS/ACR/ASA/SCA/SCAI/SIR/STS/SVM Guidelines for the Diagnosis and Management of Patients With Thoracic Aortic Disease. Circulation. 2010; 121ML:[id number]. Aortic aneurysm NOS (ICD10-I71.9). 2. Pancreatic tail mass is similar to minimally enlarged from  09/11/2020. 3. 1.5 cm thyroid isthmus nodule. Recommend thyroid ultrasound. (Ref: J Am Coll Radiol. 2015 Feb;12(2): 143-50). 4. Cirrhosis. 5. Cholelithiasis. 6. Aortic atherosclerosis (ICD10-I70.0). Coronary artery calcification. Electronically Signed   By: Lorin Picket M.D.   On: 02/11/2021 11:31     Assessment and plan- Patient is a 87 y.o. 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  He is here for routine follow-up of pancreatic cancer s/p 3 months of neoadjuvant gemcitabine Abraxane chemotherapy  Patient could not get neoadjuvant chemotherapy 3 weeks on 1 week of due to neutropenia and thrombocytopenia.  He received it 1 week on 1 week off.  He had a repeat CT scan at [facility] in August 2022 roughly after 3 months of treatment which showed slightly enlarging hypoenhancing mass in the pancreatic body with no evidence of distant metastatic disease.  I did discuss his case with Dr. Hyman Hopes at [facility] as well.  Given that we are unable to stick to a 3-week on 1 week off regimen of chemotherapy and also recurrent UTIs with the patient had of the last 1 month I would favor proceeding with surgery at this time.  Patient is scheduled for laparoscopy surgery for removal of pancreatic mass on 02/25/2021.  I will probably see him 2 to 3 weeks after surgery.  Depending on his overall performance status and quality of life I will discuss about restarting chemotherapy at some point in the future.  Bright red blood per rectum: 1 episode this morning.  If he continues to have more such episodes he will reach out to [place] and I will get in touch with Dr. Marius Ditch to consider flexible sigmoidoscopy.   Visit Diagnosis 1. Malignant neoplasm of body of pancreas ([place])      Dr. Randa Evens, MD, MPH [facility] at [facility] [id number] 02/15/2021 9:03 AM

## 2021-02-22 DIAGNOSIS — C251 Malignant neoplasm of body of pancreas: Secondary | ICD-10-CM | POA: Diagnosis not present

## 2021-02-22 DIAGNOSIS — Z01812 Encounter for preprocedural laboratory examination: Secondary | ICD-10-CM | POA: Diagnosis not present

## 2021-02-22 DIAGNOSIS — Z20822 Contact with and (suspected) exposure to covid-19: Secondary | ICD-10-CM | POA: Diagnosis not present

## 2021-02-24 ENCOUNTER — Other Ambulatory Visit: Payer: Self-pay | Admitting: Urology

## 2021-02-25 DIAGNOSIS — Z9221 Personal history of antineoplastic chemotherapy: Secondary | ICD-10-CM | POA: Diagnosis not present

## 2021-02-25 DIAGNOSIS — I447 Left bundle-branch block, unspecified: Secondary | ICD-10-CM | POA: Diagnosis not present

## 2021-02-25 DIAGNOSIS — Z9081 Acquired absence of spleen: Secondary | ICD-10-CM | POA: Diagnosis not present

## 2021-02-25 DIAGNOSIS — M48061 Spinal stenosis, lumbar region without neurogenic claudication: Secondary | ICD-10-CM | POA: Diagnosis not present

## 2021-02-25 DIAGNOSIS — Z8546 Personal history of malignant neoplasm of prostate: Secondary | ICD-10-CM | POA: Diagnosis not present

## 2021-02-25 DIAGNOSIS — Z79899 Other long term (current) drug therapy: Secondary | ICD-10-CM | POA: Diagnosis not present

## 2021-02-25 DIAGNOSIS — Z888 Allergy status to other drugs, medicaments and biological substances status: Secondary | ICD-10-CM | POA: Diagnosis not present

## 2021-02-25 DIAGNOSIS — Z923 Personal history of irradiation: Secondary | ICD-10-CM | POA: Diagnosis not present

## 2021-02-25 DIAGNOSIS — E559 Vitamin D deficiency, unspecified: Secondary | ICD-10-CM | POA: Diagnosis not present

## 2021-02-25 DIAGNOSIS — Z20822 Contact with and (suspected) exposure to covid-19: Secondary | ICD-10-CM | POA: Diagnosis not present

## 2021-02-25 DIAGNOSIS — Z90410 Acquired total absence of pancreas: Secondary | ICD-10-CM | POA: Diagnosis not present

## 2021-02-25 DIAGNOSIS — G8918 Other acute postprocedural pain: Secondary | ICD-10-CM | POA: Diagnosis not present

## 2021-02-25 DIAGNOSIS — C251 Malignant neoplasm of body of pancreas: Secondary | ICD-10-CM | POA: Diagnosis not present

## 2021-02-25 DIAGNOSIS — R079 Chest pain, unspecified: Secondary | ICD-10-CM | POA: Diagnosis not present

## 2021-02-25 DIAGNOSIS — Z483 Aftercare following surgery for neoplasm: Secondary | ICD-10-CM | POA: Diagnosis not present

## 2021-02-25 DIAGNOSIS — I4891 Unspecified atrial fibrillation: Secondary | ICD-10-CM | POA: Diagnosis not present

## 2021-02-25 DIAGNOSIS — C252 Malignant neoplasm of tail of pancreas: Secondary | ICD-10-CM | POA: Diagnosis not present

## 2021-02-25 DIAGNOSIS — I480 Paroxysmal atrial fibrillation: Secondary | ICD-10-CM

## 2021-02-25 DIAGNOSIS — I10 Essential (primary) hypertension: Secondary | ICD-10-CM | POA: Diagnosis not present

## 2021-02-25 DIAGNOSIS — I712 Thoracic aortic aneurysm, without rupture: Secondary | ICD-10-CM | POA: Diagnosis not present

## 2021-02-25 DIAGNOSIS — C259 Malignant neoplasm of pancreas, unspecified: Secondary | ICD-10-CM | POA: Diagnosis not present

## 2021-02-25 DIAGNOSIS — K219 Gastro-esophageal reflux disease without esophagitis: Secondary | ICD-10-CM | POA: Diagnosis not present

## 2021-02-25 DIAGNOSIS — I440 Atrioventricular block, first degree: Secondary | ICD-10-CM | POA: Diagnosis not present

## 2021-02-25 DIAGNOSIS — T402X5A Adverse effect of other opioids, initial encounter: Secondary | ICD-10-CM | POA: Diagnosis not present

## 2021-02-25 DIAGNOSIS — Z90411 Acquired partial absence of pancreas: Secondary | ICD-10-CM | POA: Diagnosis not present

## 2021-02-25 DIAGNOSIS — L298 Other pruritus: Secondary | ICD-10-CM | POA: Diagnosis not present

## 2021-02-25 DIAGNOSIS — K7460 Unspecified cirrhosis of liver: Secondary | ICD-10-CM | POA: Diagnosis not present

## 2021-02-25 DIAGNOSIS — E785 Hyperlipidemia, unspecified: Secondary | ICD-10-CM | POA: Diagnosis not present

## 2021-02-25 DIAGNOSIS — I4892 Unspecified atrial flutter: Secondary | ICD-10-CM | POA: Diagnosis not present

## 2021-02-25 DIAGNOSIS — T797XXA Traumatic subcutaneous emphysema, initial encounter: Secondary | ICD-10-CM | POA: Diagnosis not present

## 2021-02-25 HISTORY — PX: LAPAROSCOPIC SPLENECTOMY: SHX409

## 2021-02-25 HISTORY — PX: XI ROBOTIC ASSISTED LAPAROSCOPIC DISTAL PANCREATECTOMY: SHX6539

## 2021-02-25 HISTORY — DX: Paroxysmal atrial fibrillation: I48.0

## 2021-03-01 ENCOUNTER — Other Ambulatory Visit: Payer: Self-pay | Admitting: Oncology

## 2021-03-03 DIAGNOSIS — M48061 Spinal stenosis, lumbar region without neurogenic claudication: Secondary | ICD-10-CM | POA: Diagnosis not present

## 2021-03-03 DIAGNOSIS — K219 Gastro-esophageal reflux disease without esophagitis: Secondary | ICD-10-CM | POA: Diagnosis not present

## 2021-03-03 DIAGNOSIS — Z90411 Acquired partial absence of pancreas: Secondary | ICD-10-CM | POA: Diagnosis not present

## 2021-03-03 DIAGNOSIS — Z8546 Personal history of malignant neoplasm of prostate: Secondary | ICD-10-CM | POA: Diagnosis not present

## 2021-03-03 DIAGNOSIS — Z483 Aftercare following surgery for neoplasm: Secondary | ICD-10-CM | POA: Diagnosis not present

## 2021-03-03 DIAGNOSIS — C251 Malignant neoplasm of body of pancreas: Secondary | ICD-10-CM | POA: Diagnosis not present

## 2021-03-03 DIAGNOSIS — C259 Malignant neoplasm of pancreas, unspecified: Secondary | ICD-10-CM | POA: Diagnosis not present

## 2021-03-03 DIAGNOSIS — E559 Vitamin D deficiency, unspecified: Secondary | ICD-10-CM | POA: Diagnosis not present

## 2021-03-03 DIAGNOSIS — R079 Chest pain, unspecified: Secondary | ICD-10-CM | POA: Diagnosis not present

## 2021-03-03 DIAGNOSIS — K7460 Unspecified cirrhosis of liver: Secondary | ICD-10-CM | POA: Diagnosis not present

## 2021-03-03 DIAGNOSIS — E785 Hyperlipidemia, unspecified: Secondary | ICD-10-CM | POA: Diagnosis not present

## 2021-03-04 DIAGNOSIS — M48061 Spinal stenosis, lumbar region without neurogenic claudication: Secondary | ICD-10-CM | POA: Diagnosis not present

## 2021-03-04 DIAGNOSIS — K7460 Unspecified cirrhosis of liver: Secondary | ICD-10-CM | POA: Diagnosis not present

## 2021-03-04 DIAGNOSIS — C251 Malignant neoplasm of body of pancreas: Secondary | ICD-10-CM | POA: Diagnosis not present

## 2021-03-12 ENCOUNTER — Other Ambulatory Visit: Payer: Self-pay | Admitting: Nurse Practitioner

## 2021-03-12 ENCOUNTER — Other Ambulatory Visit: Payer: Self-pay

## 2021-03-12 ENCOUNTER — Telehealth: Payer: Self-pay | Admitting: *Deleted

## 2021-03-12 ENCOUNTER — Inpatient Hospital Stay: Payer: PPO | Attending: Nurse Practitioner | Admitting: Nurse Practitioner

## 2021-03-12 ENCOUNTER — Other Ambulatory Visit: Payer: Self-pay | Admitting: Oncology

## 2021-03-12 DIAGNOSIS — C251 Malignant neoplasm of body of pancreas: Secondary | ICD-10-CM | POA: Diagnosis not present

## 2021-03-12 DIAGNOSIS — U071 COVID-19: Secondary | ICD-10-CM | POA: Diagnosis not present

## 2021-03-12 MED ORDER — NIRMATRELVIR/RITONAVIR (PAXLOVID)TABLET: 3.0000 | ORAL_TABLET | Freq: Two times a day (BID) | ORAL | 0 refills | Status: AC

## 2021-03-12 NOTE — Telephone Encounter (Signed)
Patient called reporting that he has tested positive for COVID and is in need for the antiviral medicine ASAP as he has had his spleen removed. Please advise

## 2021-03-12 NOTE — Patient Instructions (Signed)
You are being prescribed PAXLOVID for COVID-19 infection.   Please pick up your prescription at: [facility]   Please call the pharmacy or go through the drive through vs going inside if you are picking up the mediation yourself to prevent further spread.   ADMINISTRATION INSTRUCTIONS: Take with or without food. Swallow the tablets whole. Don't chew, crush, or break the medications because it might not work as well  For each dose of the medication, you should be taking 3 tablets together TWICE a day for FIVE days   Finish your full five-day course of Paxlovid even if you feel better before you're done. Stopping this medication too early can make it less effective to prevent severe illness related to [place].    Paxlovid is prescribed for YOU ONLY. Don't share it with others, even if they have similar symptoms as you. This medication might not be right for everyone.  Make sure to take steps to protect yourself and others while you're taking this medication in order to get well soon and to prevent others from getting sick with COVID-19.   COMMON SIDE EFFECTS: Altered or bad taste in your mouth  Diarrhea  High blood pressure (1% of people) Muscle aches (1% of people)   If your COVID-19 symptoms get worse, get medical help right away. Call 911 if you experience symptoms such as worsening cough, trouble breathing, chest pain that doesn't go away, confusion, a hard time staying awake, and pale or blue-colored skin. This medication won't prevent all COVID-19 cases from getting worse.

## 2021-03-12 NOTE — Progress Notes (Signed)
Virtual Visit Progress Note  Sympt[facility] Management Clinic [facility]  [phone] [phone]  I connected with George Owens on 03/12/21 at  1:40 PM EDT by video enabled telemedicine visit and verified that I am speaking with the correct person using two identifiers.   I discussed the limitations, risks, security and privacy concerns of performing an evaluation and management service by telemedicine and the availability of in-person appointments. I also discussed with the patient that there may be a patient responsible charge related to this service. The patient expressed understanding and agreed to proceed.   Other persons participating in the visit and their role in the encounter: patient's wife   Patient's location: home  Provider's location: Clinic   Chief Complaint: Covid Infection    Patient Care Team: Crecencio Mc, MD as PCP - General (Internal Medicine) Clent Jacks, RN as Oncology Nurse Navigator   Name of the patient: George Owens  [id number]  [birth year]   Date of visit: 03/12/21  Diagnosis- Pancreatic Cancer  Chief complaint/ Reason for visit- Covid Infection  Heme/Onc history:  Oncology History  Malignant neoplasm of body of pancreas ([place])  09/18/2020 Initial Diagnosis   Malignant neoplasm of body of pancreas ([place])   10/02/2020 Cancer Staging   Staging form: Exocrine Pancreas, AJCC 8th Edition - Clinical stage from 10/02/2020: Stage IB (cT2, cN0, cM0) - Signed by Sindy Guadeloupe, MD on 10/04/2020 Total positive nodes: 0   10/08/2020 -  Chemotherapy    Patient is on Treatment Plan: PANCREATIC ABRAXANE / GEMCITABINE D1,8,15 Q28D         Interval history- Possible symptoms started on Tuesday with sinus drainage. Negative test on Wednesday. Wife as well. Tested positive this morning. He underwent RA laparoscopic pancreatectomy and splenectomy at [facility] with Dr. Hyman Hopes on 02/25/21. Had episode of a fib while  hospitalized. Currently wearing holter monitor. Otherwise feels well. Continues lovenox. No dizziness or weakness. No fevers or chills. No chest pain or shortness of breath. No nausea, vomiting, diarrhea. No urinary complaints.   Review of systems- Review of Systems  Constitutional:  Positive for malaise/fatigue. Negative for chills, diaphoresis, fever and weight loss.  HENT:  Positive for congestion. Negative for ear discharge, ear pain, nosebleeds, sinus pain and sore throat.   Eyes:  Negative for pain and redness.  Respiratory:  Negative for cough, hemoptysis, sputum production, shortness of breath, wheezing and stridor.   Cardiovascular:  Negative for chest pain, palpitations and leg swelling.  Gastrointestinal:  Negative for abdominal pain, constipation, diarrhea, nausea and vomiting.  Genitourinary:  Negative for dysuria and flank pain.  Musculoskeletal:  Negative for falls, joint pain and myalgias.  Skin:  Negative for rash.  Neurological:  Negative for dizziness, loss of consciousness, weakness and headaches.  Psychiatric/Behavioral:  Negative for depression. The patient is not nervous/anxious and does not have insomnia.   All other systems reviewed and are negative.    Allergies  Allergen Reactions   Kenalog [Triamcinolone Acetonide]     Blindness X 3 Days    Past Medical History:  Diagnosis Date   Allergy    Aneurysm of abdominal aorta (HCC)    Basal cell carcinoma    right temple Dr. Evorn Gong 12/2018   Chicken pox    Colon polyps    COVID-19 virus infection 03/16/2020   GERD (gastroesophageal reflux disease)    history of prostate CA 2006   Hyperlipidemia    Leg cramps    Neuropathy  Osteoporosis     Past Surgical History:  Procedure Laterality Date   CATARACT EXTRACTION Right    CATARACT EXTRACTION W/PHACO Left 12/26/2017   Procedure: CATARACT EXTRACTION PHACO AND INTRAOCULAR LENS PLACEMENT ([place])  COMPLICATED LEFT;  Surgeon: Leandrew Koyanagi, MD;  Location:  [place];  Service: Ophthalmology;  Laterality: Left;  [place]   colonoscopy     ESOPHAGOGASTRODUODENOSCOPY (EGD) WITH PROPOFOL     EYE MUSCLE SURGERY Left    PORTA CATH INSERTION N/A 10/19/2020   Procedure: PORTA CATH INSERTION;  Surgeon: Algernon Huxley, MD;  Location: [place] [facility];  Service: Cardiovascular;  Laterality: N/A;   TONSILLECTOMY AND ADENOIDECTOMY      Social History   Socioeconomic History   Marital status: Married    Spouse name: George Owens   Number of children: 4   Years of education: Not on file   Highest education level: Not on file  Occupational History   Occupation: retired  Tobacco Use   Smoking status: Never   Smokeless tobacco: Never  Vaping Use   Vaping Use: Never used  Substance and Sexual Activity   Alcohol use: No   Drug use: No   Sexual activity: Yes    Partners: Female  Other Topics Concern   Not on file  Social History Narrative   Lives with wife in a 5 story retirement home.  He lives on the first floor.  4 year college degree.  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     Social Determinants of Health   Financial Resource Strain: Low Risk    Difficulty of Paying Living Expenses: Not hard at all  Food Insecurity: Not on file  Transportation Needs: No Transportation Needs   Lack of Transportation (Medical): No   Lack of Transportation (Non-Medical): No  Physical Activity: Not on file  Stress: No Stress Concern Present   Feeling of Stress : Not at all  Social Connections: Unknown   Frequency of Communication with Friends and Family: Not on file   Frequency of Social Gatherings with Friends and Family: Not on file   Attends Religious Services: Not on file   Active Member of Clubs or Organizations: Not on file   Attends Archivist Meetings: Not on file   Marital Status: Married  Human resources officer Violence: Not on file    Family History  Problem Relation Age of Onset   Stroke  Mother    Arthritis Father    Heart disease Father    Brain cancer Brother        1/2 brother (possibly related to mother NOT related to patient)   Colon cancer Neg Hx    Esophageal cancer Neg Hx    Pancreatic cancer Neg Hx    Stomach cancer Neg Hx    Liver disease Neg Hx     Current Outpatient Medications:    acetaminophen (TYLENOL) 325 MG tablet, Take 650 mg by mouth every 6 (six) hours as needed., Disp: , Rfl:    ALPRAZolam (XANAX) 0.5 MG tablet, Take by mouth., Disp: , Rfl:    Calcium Carb-Cholecalciferol (CALCIUM 1000 + D PO), Take 1 tablet by mouth daily. , Disp: , Rfl:    Cholecalciferol 50 MCG (2000 UT) CAPS, Take 1 capsule by mouth daily., Disp: , Rfl:    Cranberry 1000 MG CAPS, Take 2 tablets by mouth daily., Disp: , Rfl:    CREON 36000-114000 units CPEP capsule, Take by mouth. 2 pills  with meal, 1 pill with snack, Disp: , Rfl:    Denosumab (PROLIA [place]), Inject into the skin every 6 (six) months. (Patient not taking: Reported on 02/15/2021), Disp: , Rfl:    Docusate Sodium (DSS) 100 MG CAPS, Take by mouth., Disp: , Rfl:    enoxaparin (LOVENOX) 40 MG/0.4ML injection, Inject into the skin., Disp: , Rfl:    glucosamine-chondroitin 500-400 MG tablet, Take 1 tablet by mouth daily., Disp: , Rfl:    lidocaine-prilocaine (EMLA) cream, Apply to affected area once (Patient not taking: Reported on 02/15/2021), Disp: 30 g, Rfl: 3   loratadine (CLARITIN) 10 MG tablet, Take 10 mg by mouth daily as needed. , Disp: , Rfl:    metoprolol succinate (TOPROL-XL) 50 MG 24 hr tablet, Take by mouth., Disp: , Rfl:    Misc Natural Products (HM GLUCOSAMINE CHONDROITIN PO), Take 1 tablet by mouth daily., Disp: , Rfl:    nirmatrelvir/ritonavir EUA (PAXLOVID) 20 x 150 MG & 10 x 100MG  TABS, Take 3 tablets by mouth 2 (two) times daily for 5 days. Take nirmatrelvir (150 mg) two tablets twice daily for 5 days and ritonavir (100 mg) one tablet twice daily for 5 days., Disp: 30 tablet, Rfl: 0   pantoprazole  (PROTONIX) 40 MG tablet, TAKE 1 TABLET BY MOUTH EVERY DAY, Disp: 30 tablet, Rfl: 0   phenazopyridine (PYRIDIUM) 95 MG tablet, Take 95 mg by mouth daily. 2a day, Disp: , Rfl:    pramoxine-hydrocortisone (ANALPRAM HC) cream, Place rectally., Disp: , Rfl:    Ranibizumab (LUCENTIS IO), Inject into the eye as needed. As needed every three months., Disp: , Rfl:    tamsulosin (FLOMAX) 0.4 MG CAPS capsule, Take by mouth., Disp: , Rfl:    traZODone (DESYREL) 50 MG tablet, TAKE 0.5 TABLETS (25 MG TOTAL) BY MOUTH AT BEDTIME. MAY INCREASE TO FULL TABLET IF NEEDED, Disp: 90 tablet, Rfl: 0   vitamin B-12 (CYANOCOBALAMIN) 1000 MCG tablet, Take 1,000 mcg by mouth in the morning and at bedtime., Disp: , Rfl:   Physical exam: Exam limited due to telemedicine  There were no vitals filed for this visit. Physical Exam Vitals reviewed.  Constitutional:      Appearance: He is not ill-appearing.  HENT:     Nose: Congestion and rhinorrhea present.  Pulmonary:     Effort: No respiratory distress.  Skin:    Coloration: Skin is not pale.  Neurological:     Mental Status: He is alert and oriented to person, place, and time.  Psychiatric:        Mood and Affect: Mood normal.        Behavior: Behavior normal.     CMP Latest Ref Rng & Units 02/15/2021  Glucose 70 - 99 mg/dL 170(H)  BUN 8 - 23 mg/dL 26(H)  Creatinine 0.61 - 1.24 mg/dL 0.92  Sodium 135 - 145 mmol/L 138  Potassium 3.5 - 5.1 mmol/L 4.1  Chloride 98 - 111 mmol/L 100  CO2 22 - 32 mmol/L 29  Calcium 8.9 - 10.3 mg/dL 9.1  Total Protein 6.5 - 8.1 g/dL 7.0  Total Bilirubin 0.3 - 1.2 mg/dL 0.5  Alkaline Phos 38 - 126 U/L 67  AST 15 - 41 U/L 40  ALT 0 - 44 U/L 44   CBC Latest Ref Rng & Units 02/15/2021  WBC 4.0 - 10.5 K/uL 3.8(L)  Hemoglobin 13.0 - 17.0 g/dL 13.6  Hematocrit 39.0 - 52.0 % 40.9  Platelets 150 - 400 K/uL 180   No images are  attached to the encounter.  CT CHEST WO CONTRAST  Result Date: 02/11/2021 CLINICAL DATA:  Thoracic aortic  aneurysm.  Pancreatic cancer. EXAM: CT CHEST WITHOUT CONTRAST TECHNIQUE: Multidetector CT imaging of the chest was performed following the standard protocol without IV contrast. COMPARISON:  PET, 09/29/2020, CT abdomen pelvis 09/11/2020 and CT chest 04/29/2020. FINDINGS: Cardiovascular: Right IJ Port-A-Cath terminates in the right atrium. Atherosclerotic calcification of the aorta, aortic valve and coronary arteries. Ascending aorta measures 4.5 cm. Heart size normal. No pericardial effusion. Mediastinum/Nodes: Low-attenuation thyroid isthmus nodule measures 1.8 cm, similar. No pathologically enlarged mediastinal or axillary lymph nodes. Hilar regions are difficult to definitively evaluate without IV contrast. Esophagus is grossly unremarkable. Lungs/Pleura: Biapical pleuroparenchymal scarring. No suspicious pulmonary nodules. No pleural fluid. Airway is unremarkable. Upper Abdomen: Liver margin is irregular with hypertrophy of the left hepatic lobe. Stone in the gallbladder. Visualized portions of the adrenal glands, kidneys and spleen are unremarkable. Pancreatic tail mass measures approximately 2.1 x 2.8 cm, similar to 09/11/2020, 2.0 x 2.6 cm. Visualized portions of the stomach and bowel are unremarkable. Musculoskeletal: Degenerative changes in the spine. No worrisome lytic or sclerotic lesions. IMPRESSION: 1. Ascending aortic aneurysm, stable. Ascending thoracic aortic aneurysm. Recommend semi-annual imaging followup by CTA or MRA and referral to cardiothoracic surgery if not already obtained. This recommendation follows 2010 ACCF/AHA/AATS/ACR/ASA/SCA/SCAI/SIR/STS/SVM Guidelines for the Diagnosis and Management of Patients With Thoracic Aortic Disease. Circulation. 2010; 121: A540-J811. Aortic aneurysm NOS (ICD10-I71.9). 2. Pancreatic tail mass is similar to minimally enlarged from 09/11/2020. 3. 1.5 cm thyroid isthmus nodule. Recommend thyroid ultrasound. (Ref: J Am Coll Radiol. 2015 Feb;12(2): 143-50). 4.  Cirrhosis. 5. Cholelithiasis. 6. Aortic atherosclerosis (ICD10-I70.0). Coronary artery calcification. Electronically Signed   By: Lorin Picket M.D.   On: 02/11/2021 11:31    Assessment and plan- Patient is a 87 y.o. male with pancreatic cancer,, post op surgery on 9/8 who presents for covid infection. He is high risk of severe infection based on multiple comorbidities. Recommend antiviral treatment with paxlovid. Risks, benefits, and alternatives reviewed. Supportive care reviewed. He will also monitor oxygen levels at home. Symptoms that would warrant evaluation and ER reviewed.   Advised patient to quarantine based on cdc recommendations. Will move out upcoming appointments x 2 weeks. He will continue lovenox through [facility] post op.   Visit Diagnosis 1. COVID-19 virus infection    Patient expressed understanding and was in agreement with this plan. He also understands that He can call clinic at any time with any questions, concerns, or complaints.   I discussed the assessment and treatment plan with the patient. The patient was provided an opportunity to ask questions and all were answered. The patient agreed with the plan and demonstrated an understanding of the instructions.   The patient was advised to call back or seek an in-person evaluation if the symptoms worsen or if the condition fails to improve as anticipated.   I spent 20 minutes face-to-face video visit time dedicated to the care of this patient on the date of this encounter to include pre-visit review of medical oncology, hospital notes, labs, face-to-face time with the patient, and post visit ordering of testing/documentation.   Thank you for allowing me to participate in the care of this very pleasant patient.   Beckey Rutter, DNP, AGNP-[facility]: Dr. Janese Banks

## 2021-03-12 NOTE — Telephone Encounter (Signed)
George Owens will be calling him today. He needs paxlovid sent today

## 2021-03-15 ENCOUNTER — Inpatient Hospital Stay: Payer: PPO

## 2021-03-15 ENCOUNTER — Inpatient Hospital Stay: Payer: PPO | Admitting: Oncology

## 2021-03-16 ENCOUNTER — Telehealth: Payer: Self-pay | Admitting: Gastroenterology

## 2021-03-16 ENCOUNTER — Telehealth: Payer: PPO | Admitting: Nurse Practitioner

## 2021-03-16 NOTE — Telephone Encounter (Signed)
As long as he is not having diarrhea and maintaining his weight, I am okay for him to cut back on pancreatic enzymes.  RV

## 2021-03-16 NOTE — Telephone Encounter (Signed)
Pt. Calling for a call back. He dis not discuss the reason why he wants to talk to you.

## 2021-03-16 NOTE — Telephone Encounter (Signed)
Patient is calling because he had his pancreas surgery and they took 25 to 30 percent of it. He said he has done well from the surgery and is digestion is going very well. He said today he tried only taking 1 pill daily with each meal and he has done great digesting food. He wants to know if he can keep only doing 1 pill with each meal due to the cost of the medication and being in the doughnut whole with insurance company

## 2021-03-17 ENCOUNTER — Telehealth: Payer: Self-pay | Admitting: *Deleted

## 2021-03-17 NOTE — Telephone Encounter (Signed)
Patient informed per Beckey Rutter, NP that Covid can cause diarrhea and that he should try over the counter Imodium per box directions. He than asked for something for his rectum I had suggested over the counter medicine for this and he asked for Hydrocortisone cream to be calle din as it had worked well in the  past. Patient informed per Dr Janese Banks instructions to use over the counter Anusol as it has hydrocortisone in it, He agreed to this and informed me that he did find a box of the lidocaine/Hydrocortisone cream in his cabinet and he will decide which cream he wants to try as well as using the Imodium

## 2021-03-17 NOTE — Telephone Encounter (Signed)
Patient called stating he is having side effects from the antiviral medicine he is taking for  COVID. He has developed very loose/watery diarrhea 1 1/2 days after starting the medicine. He reports that he now has rectal pain and swelling and is asking for something to be called in for the pain and diarrhea. Please George Owens

## 2021-03-17 NOTE — Telephone Encounter (Signed)
Patient verbalized understanding of instructions. He states he will take the 1 pill with each meal and watch his symptoms

## 2021-03-18 ENCOUNTER — Telehealth: Payer: Self-pay | Admitting: Nurse Practitioner

## 2021-03-18 NOTE — Telephone Encounter (Signed)
Spoke to patient in regards to diarrhea. He had reduced creon dose which he believes exacerbated his diarrhea. He resumed previous dose and diarrhea has resolved. He has not required imodium. He complains of rectal inflammation. Has not tried any topicals. We discussed otc hydrocortisone and sitz baths for comfort which he will try. Recommended if symptoms do not improve or worsen to notify clinic.

## 2021-03-21 ENCOUNTER — Encounter: Payer: Self-pay | Admitting: Oncology

## 2021-03-21 ENCOUNTER — Ambulatory Visit
Admission: EM | Admit: 2021-03-21 | Discharge: 2021-03-21 | Disposition: A | Discharge status: Home / Self Care | Payer: PPO | Source: Ambulatory Visit | Attending: Internal Medicine | Admitting: Internal Medicine

## 2021-03-21 ENCOUNTER — Other Ambulatory Visit: Payer: Self-pay

## 2021-03-21 VITALS — BP 120/70 | HR 75 | Temp 98.8°F | Resp 18 | Ht 76.0 in | Wt 173.0 lb

## 2021-03-21 DIAGNOSIS — K644 Residual hemorrhoidal skin tags: Secondary | ICD-10-CM | POA: Diagnosis not present

## 2021-03-21 DIAGNOSIS — K6289 Other specified diseases of anus and rectum: Secondary | ICD-10-CM | POA: Diagnosis not present

## 2021-03-21 NOTE — ED Triage Notes (Signed)
Pt here with C/O rectal inflammation, there is some discharge and was a little bloody. Called dr, who told him to try some OTC, but at that time there was no blood, is having little bleeding now.

## 2021-03-21 NOTE — Discharge Instructions (Addendum)
Continue use of Preparation H cream twice a day, can purchase over-the-counter zinc oxide paste( diaper rash cream) and apply on top of Preparation H to further protect skin  May use 400 to 600 mg of ibuprofen every 6 hours as needed to help with inflammation and pain  May start use of Imodium twice a day as needed to help slow diarrhea, stop use of stool softener until diarrhea has subsided  All medications listed above can be found at most local pharmacies, if having difficulty locating product please ask pharmacist or worker for assistance   Can attempt use of sitz bath's as needed for further comfort  If you begin to see an increase of blood in your stool please stop use of Lovenox and notify prescribing doctor  If symptoms persist please follow-up with oncologist or gastrointestinal doctor for further evaluation

## 2021-03-21 NOTE — ED Provider Notes (Signed)
[facility]    CSN: [id number] Arrival date & time: 03/21/21  8185      History   Chief Complaint Chief Complaint  Patient presents with   Rectal Problems    HPI WARNIE BELAIR is a 87 y.o. male.   Patient presents with anal irritation and swelling for 6 days, noticed blood on his toilet tissue 2 days ago.  Denies blood seen in toilet.  Endorses soft to watery diarrhea for 6 days as well.  Poor appetite but attempting to eat. Has attempted use of Preparation H cream which does provide some relief but has not resolved the issue.Recent pancreatic surgery on 03/04/2021 related to cancer and pancreatic insufficiency.  Followed by GI and oncologist who are aware of symptoms.  Currently on short course of Lovenox injections, 4 days remaining. denies fever, chills, abdominal pain, changes in diet, recent travel, nausea, vomiting, increased bloating or gas production.  Past Medical History:  Diagnosis Date   Allergy    Aneurysm of abdominal aorta    Basal cell carcinoma    right temple Dr. Evorn Gong 12/2018   Chicken pox    Colon polyps    COVID-19 virus infection 03/16/2020   GERD (gastroesophageal reflux disease)    history of prostate CA 2006   Hyperlipidemia    Leg cramps    Neuropathy    Osteoporosis     Patient Active Problem List   Diagnosis Date Noted   Malignant neoplasm of body of pancreas ([place]) 09/18/2020   Goals of care, counseling/discussion 09/18/2020   Cirrhosis of liver ([place]) 05/12/2020   Thoracic ascending aortic aneurysm 04/29/2020   Abnormal computerized axial tomography of liver 04/29/2020   Snoring 04/28/2020   Use of leuprolide acetate (Lupron) 04/27/2020   Thoracic aortic atherosclerosis ([place]) 04/14/2020   Anxiety about health 04/14/2020   Nocturia 04/14/2020   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   Constipation due to outlet dysfunction 02/16/2019   Dysphagia, pharyngoesophageal phase 08/01/2018   GERD (gastroesophageal reflux disease) 04/07/2018    Post-nasal drainage 06/28/2017   Change in bowel function 11/20/2016   Neuropathy 03/19/2014   Spinal stenosis of lumbar region 02/26/2014   Osteoporosis 10/06/2013   Vitamin D deficiency 09/10/2013   History of prostate cancer 03/01/2013   Personal history of colonic polyps 02/27/2013   Myalgia due to statin 02/27/2013   Visit for preventive health examination 02/27/2013   Hyperlipidemia     Past Surgical History:  Procedure Laterality Date   CATARACT EXTRACTION Right    CATARACT EXTRACTION W/PHACO Left 12/26/2017   Procedure: CATARACT EXTRACTION PHACO AND INTRAOCULAR LENS PLACEMENT ([place])  COMPLICATED LEFT;  Surgeon: Leandrew Koyanagi, MD;  Location: [facility];  Service: Ophthalmology;  Laterality: Left;  [place]   colonoscopy     ESOPHAGOGASTRODUODENOSCOPY (EGD) WITH PROPOFOL     EYE MUSCLE SURGERY Left    PORTA CATH INSERTION N/A 10/19/2020   Procedure: PORTA CATH INSERTION;  Surgeon: Algernon Huxley, MD;  Location: [facility];  Service: Cardiovascular;  Laterality: N/A;   TONSILLECTOMY AND ADENOIDECTOMY         Home Medications    Prior to Admission medications   Medication Sig Start Date End Date Taking? Authorizing Provider  acetaminophen (TYLENOL) 325 MG tablet Take 650 mg by mouth every 6 (six) hours as needed.   Yes [provider]  ALPRAZolam Duanne Moron) 0.5 MG tablet Take by mouth. 03/03/21  Yes [provider]  Calcium Carb-Cholecalciferol (CALCIUM 1000 + D PO)  Take 1 tablet by mouth daily.    Yes [provider]  Cholecalciferol 50 MCG (2000 UT) CAPS Take 1 capsule by mouth daily.   Yes [provider]  Cranberry 1000 MG CAPS Take 2 tablets by mouth daily.   Yes [provider]  CREON 36000-114000 units CPEP capsule Take by mouth. 2 pills with meal, 1 pill with snack 01/08/21  Yes [provider]  Docusate Sodium (DSS) 100 MG CAPS Take by mouth. 03/03/21 04/02/21 Yes [provider]  enoxaparin (LOVENOX) 40 MG/0.4ML injection Inject into the skin. 03/04/21 03/26/21 Yes [provider]  glucosamine-chondroitin 500-400 MG tablet Take 1 tablet by mouth daily.   Yes [provider]  loratadine (CLARITIN) 10 MG tablet Take 10 mg by mouth daily as needed.    Yes [provider]  metoprolol succinate (TOPROL-XL) 50 MG 24 hr tablet Take by mouth. 03/03/21 07/01/21 Yes [provider]  Misc Natural Products (HM GLUCOSAMINE CHONDROITIN PO) Take 1 tablet by mouth daily.   Yes [provider]  pantoprazole (PROTONIX) 40 MG tablet TAKE 1 TABLET BY MOUTH EVERY DAY 03/01/21  Yes Sindy Guadeloupe, MD  phenazopyridine (PYRIDIUM) 95 MG tablet Take 95 mg by mouth daily. 2a day   Yes [provider]  pramoxine-hydrocortisone (ANALPRAM HC) cream Place rectally.   Yes [provider]  Ranibizumab (LUCENTIS IO) Inject into the eye as needed. As needed every three months.   Yes [provider]  tamsulosin (FLOMAX) 0.4 MG CAPS capsule Take by mouth. 03/03/21 03/03/22 Yes [provider]  traZODone (DESYREL) 50 MG tablet TAKE 0.5 TABLETS (25 MG TOTAL) BY MOUTH AT BEDTIME. MAY INCREASE TO FULL TABLET IF NEEDED 11/24/20  Yes Crecencio Mc, MD  vitamin B-12 (CYANOCOBALAMIN) 1000 MCG tablet Take 1,000 mcg by mouth in the morning and at bedtime.   Yes [provider]  Denosumab (PROLIA [place]) Inject into the skin every 6 (six) months. Patient not taking: No sig reported    [provider]  lidocaine-prilocaine (EMLA) cream Apply to affected area once Patient not taking: No sig reported 10/04/20   Sindy Guadeloupe, MD    Family History Family History  Problem Relation Age of Onset   Stroke Mother    Arthritis Father    Heart disease Father    Brain cancer Brother        1/2 brother (possibly related to mother NOT related to patient)   Colon cancer Neg Hx    Esophageal cancer Neg Hx    Pancreatic  cancer Neg Hx    Stomach cancer Neg Hx    Liver disease Neg Hx     Social History Social History   Tobacco Use   Smoking status: Never   Smokeless tobacco: Never  Vaping Use   Vaping Use: Never used  Substance Use Topics   Alcohol use: No   Drug use: No     Allergies   Kenalog [triamcinolone acetonide]   Review of Systems Review of Systems  Constitutional: Negative.   HENT: Negative.    Respiratory: Negative.    Cardiovascular: Negative.   Gastrointestinal:  Positive for rectal pain. Negative for abdominal distention, abdominal pain, anal bleeding, blood in stool, constipation, diarrhea, nausea and vomiting.  Genitourinary: Negative.   Neurological: Negative.     Physical Exam Triage Vital Signs ED Triage Vitals  Enc Vitals Group     BP 03/21/21 0919 120/70     Pulse Rate 03/21/21 0919  75     Resp 03/21/21 0919 18     Temp 03/21/21 0919 98.8 F (37.1 C)     Temp Source 03/21/21 0919 Oral     SpO2 03/21/21 0919 96 %     Weight 03/21/21 0916 173 lb (78.5 kg)     Height 03/21/21 0916 6\' 4"  (1.93 m)     Head Circumference --      Peak Flow --      Pain Score 03/21/21 0916 0     Pain Loc --      Pain Edu? --      Excl. in [place]? --    No data found.  Updated Vital Signs BP 120/70 (BP Location: Left Arm)   Pulse 75   Temp 98.8 F (37.1 C) (Oral)   Resp 18   Ht 6\' 4"  (1.93 m)   Wt 173 lb (78.5 kg)   SpO2 96%   BMI 21.06 kg/m   Visual Acuity Right Eye Distance:   Left Eye Distance:   Bilateral Distance:    Right Eye Near:   Left Eye Near:    Bilateral Near:     Physical Exam Constitutional:      Appearance: Normal appearance. He is normal weight.  HENT:     Head: Normocephalic.  Eyes:     Extraocular Movements: Extraocular movements intact.  Pulmonary:     Effort: Pulmonary effort is normal.  Genitourinary:    Comments: External hemorrhoid present between 9 and 12 o, clock, does not appear thrombosed, no anal fissure present, erythematous  irritation present around the perianal Neurological:     General: No focal deficit present.     Mental Status: He is alert and oriented to person, place, and time. Mental status is at baseline.  Psychiatric:        Mood and Affect: Mood normal.        Behavior: Behavior normal.     UC Treatments / Results  Labs (all labs ordered are listed, but only abnormal results are displayed) Labs Reviewed - No data to display  EKG   Radiology No results found.  Procedures Procedures (including critical care time)  Medications Ordered in UC Medications - No data to display  Initial Impression / Assessment and Plan / UC Course  I have reviewed the triage vital signs and the nursing notes.  Pertinent labs & imaging results that were available during my care of the patient were reviewed by me and considered in my medical decision making (see chart for details).  External hemorrhoid Rectal pain  Advised continued use of Preparation H cream twice a day, advised adding zinc oxide paste in addition to be placed over top Preparation H cream twice a day, advised use of 400 to 600 mg of ibuprofen every 6 hours needed to have further help with pain, advised use of Imodium twice a day to slow diarrhea, patient endorses that he takes daily stool softener, advised discontinuation until his diarrhea has resolved, patient endorses that he takes daily Lovenox injections, has 4 days left for postop treatment, discussed benefit of medication outweighing small amount of bleeding, advised continued treatment with strict precautions that if bleeding increases to notify prescribing physician as soon as possible, patient to follow-up with oncologist or gastrointestinal specialist if symptoms continue to persist Final Clinical Impressions(s) / UC Diagnoses   Final diagnoses:  External hemorrhoid  Rectal pain     Discharge Instructions      Continue use of Preparation  H cream twice a day, can purchase  over-the-counter zinc oxide paste( diaper rash cream) and apply on top of Preparation H to further protect skin  May use 400 to 600 mg of ibuprofen every 6 hours as needed to help with inflammation and pain  May start use of Imodium twice a day as needed to help slow diarrhea, stop use of stool softener until diarrhea has subsided  All medications listed above can be found at most local pharmacies, if having difficulty locating product please ask pharmacist or worker for assistance   Can attempt use of sitz bath's as needed for further comfort  If you begin to see an increase of blood in your stool please stop use of Lovenox and notify prescribing doctor  If symptoms persist please follow-up with oncologist or gastrointestinal doctor for further evaluation   ED Prescriptions   None    PDMP not reviewed this encounter.   Hans Eden, NP 03/21/21 1029

## 2021-03-22 ENCOUNTER — Other Ambulatory Visit: Payer: Self-pay

## 2021-03-22 ENCOUNTER — Telehealth: Payer: Self-pay | Admitting: *Deleted

## 2021-03-22 ENCOUNTER — Other Ambulatory Visit: Payer: Self-pay | Admitting: *Deleted

## 2021-03-22 DIAGNOSIS — Z48815 Encounter for surgical aftercare following surgery on the digestive system: Secondary | ICD-10-CM | POA: Diagnosis not present

## 2021-03-22 DIAGNOSIS — C259 Malignant neoplasm of pancreas, unspecified: Secondary | ICD-10-CM | POA: Diagnosis not present

## 2021-03-22 DIAGNOSIS — R2681 Unsteadiness on feet: Secondary | ICD-10-CM | POA: Diagnosis not present

## 2021-03-22 DIAGNOSIS — K625 Hemorrhage of anus and rectum: Secondary | ICD-10-CM

## 2021-03-22 DIAGNOSIS — I480 Paroxysmal atrial fibrillation: Secondary | ICD-10-CM | POA: Insufficient documentation

## 2021-03-22 DIAGNOSIS — U071 COVID-19: Secondary | ICD-10-CM

## 2021-03-22 DIAGNOSIS — C251 Malignant neoplasm of body of pancreas: Secondary | ICD-10-CM

## 2021-03-22 DIAGNOSIS — M6281 Muscle weakness (generalized): Secondary | ICD-10-CM | POA: Diagnosis not present

## 2021-03-22 DIAGNOSIS — I7121 Aneurysm of the ascending aorta, without rupture: Secondary | ICD-10-CM | POA: Diagnosis not present

## 2021-03-22 DIAGNOSIS — Z8679 Personal history of other diseases of the circulatory system: Secondary | ICD-10-CM | POA: Diagnosis not present

## 2021-03-22 DIAGNOSIS — Z8546 Personal history of malignant neoplasm of prostate: Secondary | ICD-10-CM | POA: Diagnosis not present

## 2021-03-22 DIAGNOSIS — E785 Hyperlipidemia, unspecified: Secondary | ICD-10-CM | POA: Diagnosis not present

## 2021-03-22 DIAGNOSIS — R2689 Other abnormalities of gait and mobility: Secondary | ICD-10-CM | POA: Diagnosis not present

## 2021-03-22 NOTE — Telephone Encounter (Signed)
Patient called asking that someone please respond to his message he sent last evening via My Chart. Per Dr Janese Banks, patient can come in today or tomorrow for CBCD, IIBC, Ferr lab check and he can stop his Lovenox injection as well. She has messaged Dr Marius Ditch to get him seen by her and she has agreed to see him, but per her office the first available is 04/14/21. Dr Janese Banks said that Dr Marius Ditch is looking into this. Patient has been notified and has agreed to come tomorrow at 9 for labs. He repeated back to me that  he will stop the Lovenox injections.[name] He will await to hear back from Dr Verlin Grills office re appointment there

## 2021-03-22 NOTE — Progress Notes (Signed)
Order labs for Dr. Marius Ditch on Wednesday

## 2021-03-23 ENCOUNTER — Other Ambulatory Visit: Payer: PPO

## 2021-03-24 ENCOUNTER — Other Ambulatory Visit: Payer: Self-pay | Admitting: Oncology

## 2021-03-24 ENCOUNTER — Encounter: Payer: Self-pay | Admitting: Gastroenterology

## 2021-03-24 ENCOUNTER — Other Ambulatory Visit: Payer: Self-pay

## 2021-03-24 ENCOUNTER — Ambulatory Visit (INDEPENDENT_AMBULATORY_CARE_PROVIDER_SITE_OTHER): Payer: PPO | Admitting: Gastroenterology

## 2021-03-24 VITALS — BP 128/75 | HR 72 | Temp 98.2°F | Wt 178.4 lb

## 2021-03-24 DIAGNOSIS — K641 Second degree hemorrhoids: Secondary | ICD-10-CM

## 2021-03-24 DIAGNOSIS — K644 Residual hemorrhoidal skin tags: Secondary | ICD-10-CM

## 2021-03-24 DIAGNOSIS — K625 Hemorrhage of anus and rectum: Secondary | ICD-10-CM | POA: Diagnosis not present

## 2021-03-24 MED ORDER — HYDROCORTISONE (PERIANAL) 2.5 % EX CREA: 1.0000 "application " | TOPICAL_CREAM | Freq: Two times a day (BID) | CUTANEOUS | 0 refills | Status: DC

## 2021-03-24 NOTE — Progress Notes (Signed)
George Darby, MD [address]  [place], [place] [address]  Main: [phone]  Fax: [phone]    Gastroenterology Consultation  Referring Provider:     Crecencio Mc, MD Primary Care Physician:  Crecencio Mc, MD Primary Gastroenterologist:  Dr. Cephas Owens Reason for Consultation:    Exocrine pancreatic insufficiency, pancreatic cancer, rectal bleeding       HPI:   George Owens is a 87 y.o. male referred by Dr. Crecencio Mc, MD  for consultation & management of altered bowel habits and abdominal bloating.  Patient reports that he has been experiencing abdominal bloating, particularly postprandial associated with variable stool consistency anywhere from normal caliber to pencil thin and sometimes hard.  He underwent colonoscopy in 4/21 which revealed polyps and diverticulosis of the sigmoid colon as well as internal hemorrhoids.  Patient also had rectal pain, was treated with hemorrhoid cream.  He is not concerned about rectal pain today.  Patient is also diagnosed with cirrhosis of liver based on CT chest and followed by ultrasound liver.  He does have new onset of mild thrombocytopenia, 142 in 4/21.  Patient underwent work-up for secondary liver disease, found to have ANA positive and elevated ferritin <500  Follow-up visit 10/06/2020 Patient is diagnosed with adenocarcinoma of pancreas, stage I, resectable, planning to start neoadjuvant chemo.  He continues to have abdominal bloating and loose stools, primarily postprandial.  His weight is stable.  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  He is currently undergoing neoadjuvant chemo for stage I pancreatic adenocarcinoma.  Patient is also scheduled to undergo pancreaticobiliary surgery in September.  Fortunately patient's weight has been stable in last 3 months.  He finds Creon to be expensive and was denied from patient assistance program.  However,  he manages to take Creon 4 to 5 pills daily.  He wants to know if he can take over-the-counter digestive enzymes that has low-dose lipase.  He reports having abdominal bloating as well as up to 6 soft bowel movements every time he urinates.  Patient is also consuming whole milk daily.  He is accompanied by his wife today  Follow-up visit 03/24/2021 Patient underwent robotic assisted laparoscopic distal pancreatectomy and splenectomy on 02/25/2021.  He recovered well from surgery.  He has been tolerating p.o. well and able to maintain weight.  He restarted Creon.  Because of cost issues, patient has cut back on the number of Creon pills per day to 1 or 2 pills.  He did notice that his diarrhea recurs if he does not take Creon with meals.  Patient made an urgent visit to see me because last Saturday, he started noticing bright red blood per rectum on wiping, in toilet bowel.  He was taking Lovenox injections postoperatively and his last dose was on Monday.  He thought his hemorrhoids were inflamed and he noticed some bloody discharge.  He tried over-the-counter Preparation H.  Because of the bleeding, he went to the ER.  He was diagnosed with inflamed external hemorrhoid causing rectal pain.  He was advised to continue Preparation H twice daily and add zinc oxide paste, as well as ibuprofen 400-600 mg every 6 hours.  Patient reports that rectal discomfort has significantly improved, bleeding has slowed down.  He has been using wet wipes and water to clean the area after a BM.  His bowel movements are more or less formed.  NSAIDs: None  Antiplts/Anticoagulants/Anti thrombotics:  None  GI Procedures:  Upper endoscopy 11/29/2018 - Normal esophagus. - No endoscopic esophageal abnormality to explain patient's dysphagia. Dilation attempted, but aborted due to resistance with Savary and known Zenker's diverticulum. - Multiple gastric polyps. Biopsied. - Normal examined duodenum.  Surgical [P], gastric polyp BX -  HYPERPLASTIC POLYP. - WARTHIN-STARRY IS NEGATIVE FOR HELICOBACTER PYLORI. - NO INTESTINAL METAPLASIA, DYSPLASIA, OR MALIGNANCY.  Colonoscopy 10/09/2019 - Nine 3 to 6 mm polyps in the sigmoid colon, in the ascending colon and in the cecum, removed with a cold snare. Resected and retrieved. - Diverticulosis in the sigmoid colon. - Non-bleeding internal hemorrhoids.  Diagnosis Surgical [P], colon, ascending and cecum, sigmoid, polyp (9) - TUBULAR ADENOMA(S) WITHOUT HIGH-GRADE DYSPLASIA OR MALIGNANCY - INFLAMMATORY POLYP(S)  Past Medical History:  Diagnosis Date   Allergy    Aneurysm of abdominal aorta    Basal cell carcinoma    right temple Dr. Evorn Gong 12/2018   Chicken pox    Colon polyps    COVID-19 virus infection 03/16/2020   GERD (gastroesophageal reflux disease)    history of prostate CA 2006   Hyperlipidemia    Leg cramps    Neuropathy    Osteoporosis     Past Surgical History:  Procedure Laterality Date   CATARACT EXTRACTION Right    CATARACT EXTRACTION W/PHACO Left 12/26/2017   Procedure: CATARACT EXTRACTION PHACO AND INTRAOCULAR LENS PLACEMENT ([place])  COMPLICATED LEFT;  Surgeon: Leandrew Koyanagi, MD;  Location: [facility];  Service: Ophthalmology;  Laterality: Left;  [place]   colonoscopy     ESOPHAGOGASTRODUODENOSCOPY (EGD) WITH PROPOFOL     EYE MUSCLE SURGERY Left    PORTA CATH INSERTION N/A 10/19/2020   Procedure: PORTA CATH INSERTION;  Surgeon: Algernon Huxley, MD;  Location: [facility];  Service: Cardiovascular;  Laterality: N/A;   TONSILLECTOMY AND ADENOIDECTOMY      Current Outpatient Medications:    acetaminophen (TYLENOL) 325 MG tablet, Take 650 mg by mouth every 6 (six) hours as needed., Disp: , Rfl:    ALPRAZolam (XANAX) 0.5 MG tablet, Take by mouth., Disp: , Rfl:    Calcium Carb-Cholecalciferol (CALCIUM 1000 + D PO), Take 1 tablet by mouth daily. , Disp: , Rfl:    Cholecalciferol 50 MCG (2000 UT) CAPS, Take 1 capsule by  mouth daily., Disp: , Rfl:    Cranberry 1000 MG CAPS, Take 2 tablets by mouth daily., Disp: , Rfl:    CREON 36000-114000 units CPEP capsule, Take by mouth. 2 pills with meal, 1 pill with snack, Disp: , Rfl:    Docusate Sodium (DSS) 100 MG CAPS, Take by mouth., Disp: , Rfl:    glucosamine-chondroitin 500-400 MG tablet, Take 1 tablet by mouth daily., Disp: , Rfl:    hydrocortisone (ANUSOL-HC) 2.5 % rectal cream, Place 1 application rectally 2 (two) times daily., Disp: 30 g, Rfl: 0   ibuprofen (ADVIL) 200 MG tablet, Take 200 mg by mouth every 6 (six) hours as needed. Taking 200mg  2 times a day, Disp: , Rfl:    loratadine (CLARITIN) 10 MG tablet, Take 10 mg by mouth daily as needed. , Disp: , Rfl:    metoprolol succinate (TOPROL-XL) 50 MG 24 hr tablet, Take by mouth., Disp: , Rfl:    Misc Natural Products (HM GLUCOSAMINE CHONDROITIN PO), Take 1 tablet by mouth daily., Disp: , Rfl:    pantoprazole (PROTONIX) 40 MG tablet, TAKE 1 TABLET BY MOUTH EVERY DAY, Disp: 30 tablet, Rfl: 0   phenazopyridine (PYRIDIUM)  95 MG tablet, Take 95 mg by mouth daily. 2a day, Disp: , Rfl:    pramoxine-hydrocortisone (ANALPRAM HC) cream, Place rectally., Disp: , Rfl:    Ranibizumab (LUCENTIS IO), Inject into the eye as needed. As needed every three months., Disp: , Rfl:    tamsulosin (FLOMAX) 0.4 MG CAPS capsule, Take by mouth., Disp: , Rfl:    traZODone (DESYREL) 50 MG tablet, TAKE 0.5 TABLETS (25 MG TOTAL) BY MOUTH AT BEDTIME. MAY INCREASE TO FULL TABLET IF NEEDED, Disp: 90 tablet, Rfl: 0   vitamin B-12 (CYANOCOBALAMIN) 1000 MCG tablet, Take 1,000 mcg by mouth in the morning and at bedtime., Disp: , Rfl:    Denosumab (PROLIA [place]), Inject into the skin every 6 (six) months. (Patient not taking: No sig reported), Disp: , Rfl:    enoxaparin (LOVENOX) 40 MG/0.4ML injection, Inject into the skin. (Patient not taking: Reported on 03/24/2021), Disp: , Rfl:    lidocaine-prilocaine (EMLA) cream, Apply to affected area once (Patient  not taking: No sig reported), Disp: 30 g, Rfl: 3   Family History  Problem Relation Age of Onset   Stroke Mother    Arthritis Father    Heart disease Father    Brain cancer Brother        1/2 brother (possibly related to mother NOT related to patient)   Colon cancer Neg Hx    Esophageal cancer Neg Hx    Pancreatic cancer Neg Hx    Stomach cancer Neg Hx    Liver disease Neg Hx      Social History   Tobacco Use   Smoking status: Never   Smokeless tobacco: Never  Vaping Use   Vaping Use: Never used  Substance Use Topics   Alcohol use: No   Drug use: No    Allergies as of 03/24/2021 - Review Complete 03/24/2021  Allergen Reaction Noted   Kenalog [triamcinolone acetonide]  12/13/2012    Review of Systems:    All systems reviewed and negative except where noted in HPI.   Physical Exam:  BP 128/75 (BP Location: Left Arm, Patient Position: Sitting, Cuff Size: Normal)   Pulse 72   Temp 98.2 F (36.8 C) (Oral)   Wt 178 lb 6.4 oz (80.9 kg)   BMI 21.72 kg/m  No LMP for male patient.  General:   Alert,  Well-developed, well-nourished, pleasant and cooperative in NAD Head:  Normocephalic and atraumatic. Eyes:  Sclera clear, no icterus.   Conjunctiva pink. Ears:  Normal auditory acuity. Nose:  No deformity, discharge, or lesions. Mouth:  No deformity or lesions,oropharynx pink & moist. Neck:  Supple; no masses or thyromegaly. Lungs:  Respirations even and unlabored.  Clear throughout to auscultation.   No wheezes, crackles, or rhonchi. No acute distress. Heart:  Regular rate and rhythm; no murmurs, clicks, rubs, or gallops. Abdomen:  Normal bowel sounds. Soft, non-tender and mildly distended, tympanic without masses, hepatosplenomegaly or hernias noted.  No guarding or rebound tenderness.   Rectal: Perianal skin tags, nontender, do not appear inflamed.  Mild tenderness on digital rectal exam, brown stool in rectal vault Msk:  Symmetrical without gross deformities. Good,  equal movement & strength bilaterally. Pulses:  Normal pulses noted. Extremities:  No clubbing or edema.  No cyanosis. Neurologic:  Alert and oriented x3;  grossly normal neurologically. Skin:  Intact without significant lesions or rashes. No jaundice. Psych:  Alert and cooperative. Normal mood and affect.  Imaging Studies: Reviewed  Assessment and Plan:   [name]  is a 87 y.o. Caucasian male with stage I pancreatic adenocarcinoma on neoadjuvant chemo and ?Cirrhosis of liver is seen as an urgent visit for rectal bleeding  Rectal bleeding associated with rectal discomfort Patient already had a colonoscopy in 09/2019 which was unremarkable except for polyps and sigmoid diverticulosis and internal hemorrhoids.   Likely secondary to symptomatic hemorrhoids and inflamed skin tags Recommend Anusol cream twice daily Check hemoglobin today I have also discussed about outpatient hemorrhoid ligation if his symptoms are persistent despite using Anusol cream.  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  Patient can also take over-the-counter digestive enzymes as he finds Creon to be very expensive. Explained to the patient that he will stay on pancreatic enzymes indefinitely and  H. pylori breath test is negative Also, advised him to try lactose-free diet given ongoing bloating and loose stools which he is at risk given his advanced age   ?Cirrhosis of liver: Ultrasound liver revealed nodularity of the liver with no evidence of portal vein thrombosis.  Recent CT revealed lobulated liver surface only.  If at all he has cirrhosis, etiology is likely secondary to NASH, appears to be well compensated child A LFTs normal, no  evidence of hypoalbuminemia or ascites Secondary liver disease work-up is negative except for positive ANA, viral hepatitis panel negative No evidence of thrombocytopenia or anemia No evidence of varices LFTs are normal Recommend low-sodium diet HCC screening: No evidence of liver lesions based on recent CT scan in 3/22   Follow up in 2 weeks   George Darby, MD

## 2021-03-25 ENCOUNTER — Other Ambulatory Visit: Payer: Self-pay | Admitting: *Deleted

## 2021-03-25 ENCOUNTER — Encounter: Payer: Self-pay | Admitting: Gastroenterology

## 2021-03-25 DIAGNOSIS — M6281 Muscle weakness (generalized): Secondary | ICD-10-CM | POA: Diagnosis not present

## 2021-03-25 DIAGNOSIS — Z48815 Encounter for surgical aftercare following surgery on the digestive system: Secondary | ICD-10-CM | POA: Diagnosis not present

## 2021-03-25 DIAGNOSIS — R2681 Unsteadiness on feet: Secondary | ICD-10-CM | POA: Diagnosis not present

## 2021-03-25 DIAGNOSIS — R2689 Other abnormalities of gait and mobility: Secondary | ICD-10-CM | POA: Diagnosis not present

## 2021-03-25 DIAGNOSIS — C251 Malignant neoplasm of body of pancreas: Secondary | ICD-10-CM

## 2021-03-25 DIAGNOSIS — C259 Malignant neoplasm of pancreas, unspecified: Secondary | ICD-10-CM | POA: Diagnosis not present

## 2021-03-25 LAB — FERRITIN: Ferritin: 410 ng/mL — ABNORMAL HIGH (ref 30–400)

## 2021-03-25 LAB — IRON AND TIBC
Iron Saturation: 37 % (ref 15–55)
Iron: 92 ug/dL (ref 38–169)
Total Iron Binding Capacity: 250 ug/dL (ref 250–450)
UIBC: 158 ug/dL (ref 111–343)

## 2021-03-25 LAB — CBC
Hematocrit: 41.4 % (ref 37.5–51.0)
Hemoglobin: 13.6 g/dL (ref 13.0–17.7)
MCH: 30.6 pg (ref 26.6–33.0)
MCHC: 32.9 g/dL (ref 31.5–35.7)
MCV: 93 fL (ref 79–97)
Platelets: 324 10*3/uL (ref 150–450)
RBC: 4.44 x10E6/uL (ref 4.14–5.80)
RDW: 12.4 % (ref 11.6–15.4)
WBC: 5.5 10*3/uL (ref 3.4–10.8)

## 2021-03-26 DIAGNOSIS — C251 Malignant neoplasm of body of pancreas: Secondary | ICD-10-CM | POA: Diagnosis not present

## 2021-03-29 ENCOUNTER — Encounter: Payer: Self-pay | Admitting: Oncology

## 2021-03-29 ENCOUNTER — Inpatient Hospital Stay (HOSPITAL_BASED_OUTPATIENT_CLINIC_OR_DEPARTMENT_OTHER): Payer: PPO | Admitting: Oncology

## 2021-03-29 ENCOUNTER — Other Ambulatory Visit: Payer: Self-pay

## 2021-03-29 ENCOUNTER — Inpatient Hospital Stay: Payer: PPO | Attending: Oncology

## 2021-03-29 VITALS — BP 93/73 | HR 75 | Temp 97.7°F | Resp 16 | Ht 76.0 in | Wt 176.2 lb

## 2021-03-29 DIAGNOSIS — Z5111 Encounter for antineoplastic chemotherapy: Secondary | ICD-10-CM | POA: Insufficient documentation

## 2021-03-29 DIAGNOSIS — Z7901 Long term (current) use of anticoagulants: Secondary | ICD-10-CM | POA: Diagnosis not present

## 2021-03-29 DIAGNOSIS — M6281 Muscle weakness (generalized): Secondary | ICD-10-CM | POA: Diagnosis not present

## 2021-03-29 DIAGNOSIS — C251 Malignant neoplasm of body of pancreas: Secondary | ICD-10-CM | POA: Insufficient documentation

## 2021-03-29 DIAGNOSIS — R2681 Unsteadiness on feet: Secondary | ICD-10-CM | POA: Diagnosis not present

## 2021-03-29 DIAGNOSIS — K649 Unspecified hemorrhoids: Secondary | ICD-10-CM | POA: Insufficient documentation

## 2021-03-29 DIAGNOSIS — Z48815 Encounter for surgical aftercare following surgery on the digestive system: Secondary | ICD-10-CM | POA: Diagnosis not present

## 2021-03-29 DIAGNOSIS — I480 Paroxysmal atrial fibrillation: Secondary | ICD-10-CM | POA: Diagnosis not present

## 2021-03-29 DIAGNOSIS — R2689 Other abnormalities of gait and mobility: Secondary | ICD-10-CM | POA: Diagnosis not present

## 2021-03-29 DIAGNOSIS — Z7189 Other specified counseling: Secondary | ICD-10-CM

## 2021-03-29 DIAGNOSIS — C259 Malignant neoplasm of pancreas, unspecified: Secondary | ICD-10-CM | POA: Diagnosis not present

## 2021-03-29 NOTE — Progress Notes (Signed)
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  He was having symptoms of Abdominal pain and reflux and therefore underwent a CT abdomen and pelvis with contrast on 09/11/2020 which showed a hypointense ill-defined mass in the pancreatic body up to 25 mm.  No extrapancreatic infiltrative density about the aorta or proximal [name].  No local regional adenopathy or distant metastatic disease.  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  This was followed by an EUS at [facility] which showed a hypoechoic mass measuring 1.8 x 1.6 cm in the pancreatic body.  Endosonographic borders poorly defined.  Upstream pancreatic duct dilatation.  No abnormal appearing lymph nodes.  Endosonographic imaging of the liver showed no lesion.  Biopsy showed adenocarcinoma.   PET scan showed 2.7 x 2 cm ovoid mass in the body of the pancreas with an SUV of 7.1.  18 mm hypermetabolic nodule in the isthmus of the thyroid gland.  No evidence of local regional adenopathy or distant metastatic disease.   Patient was evaluated by Dr. Hyman Hopes at [facility] for consideration of surgery and has been deemed to be upfront resectable surgical candidate.  However plan was to offer him neoadjuvant chemotherapy for 3 months to control micrometastatic disease before proceeding with definitive surgery.  If ultimately patient decides not to proceed with surgery then radiation at [facility] also remains an option.    Patient received 8 doses of gemcitabine and Abraxane chemotherapy 1 week on 1 week off due to neutropenia and thrombocytopenia.  Plan is to proceed with definitive surgery at this time as patient was also getting recurrent UTIs on chemotherapy.  Depending on his recovery after surgery further adjuvant chemotherapy can be considered  Patient underwent laparoscopy surgery by Dr. Hyman Hopes at [facility].  Final pathology showed 3 cm grade 3 poorly differentiated adenocarcinoma of the pancreas with negative margins.  15 lymph nodes negative for malignancy.  Absent chemotherapy response with extensive residual cancer and no evidence of tumor regression.  Perineural invasion identified lymphovascular invasion not identified.    Interval history-patient is recovered well from surgery.  Reports occasional catching sensation at one of his laparoscopy incision sites.  After he was diagnosed with COVID about 5 weeks ago his diarrhea flared up with back fluid.  That led to hemorrhoids getting worse and was seen by GI Dr. Marius Ditch who has recommended banding of his hemorrhoids.  He is still taking Creon and is wondering if he can reduce the dose or stop it altogether.  ECOG PS- 1 Pain scale- 0  Review of Systems  Constitutional:  Positive for malaise/fatigue. Negative for chills, fever and weight loss.  HENT:  Negative for congestion, ear discharge and nosebleeds.   Eyes:  Negative for blurred vision.  Respiratory:  Negative for cough, hemoptysis, sputum production, shortness of breath and wheezing.   Cardiovascular:  Negative for chest pain, palpitations, orthopnea and claudication.  Gastrointestinal:  Positive for blood in stool. Negative for abdominal pain, constipation, diarrhea, heartburn, melena, nausea and vomiting.  Genitourinary:  Negative  for dysuria, flank pain, frequency, hematuria and urgency.  Musculoskeletal:  Negative for back pain, joint pain and myalgias.  Skin:  Negative for rash.  Neurological:   Negative for dizziness, tingling, focal weakness, seizures, weakness and headaches.  Endo/Heme/Allergies:  Does not bruise/bleed easily.  Psychiatric/Behavioral:  Negative for depression and suicidal ideas. The patient does not have insomnia.     Allergies  Allergen Reactions   Kenalog [Triamcinolone Acetonide]     Blindness X 3 Days     Past Medical History:  Diagnosis Date   Allergy    Aneurysm of abdominal aorta    Basal cell carcinoma    right temple Dr. 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     Past Surgical History:  Procedure Laterality Date   CATARACT EXTRACTION Right    CATARACT EXTRACTION W/PHACO Left 12/26/2017   Procedure: CATARACT EXTRACTION PHACO AND INTRAOCULAR LENS PLACEMENT ([place])  COMPLICATED LEFT;  Surgeon: Leandrew Koyanagi, MD;  Location: [place];  Service: Ophthalmology;  Laterality: Left;  [place]   colonoscopy     ESOPHAGOGASTRODUODENOSCOPY (EGD) WITH PROPOFOL     EYE MUSCLE SURGERY Left    PORTA CATH INSERTION N/A 10/19/2020   Procedure: PORTA CATH INSERTION;  Surgeon: Algernon Huxley, MD;  Location: [place] [facility];  Service: Cardiovascular;  Laterality: N/A;   TONSILLECTOMY AND ADENOIDECTOMY      Social History   Socioeconomic History   Marital status: Married    Spouse name: Eloise   Number of children: 4   Years of education: Not on file   Highest education level: Not on file  Occupational History   Occupation: retired  Tobacco Use   Smoking status: Never   Smokeless tobacco: Never  Vaping Use   Vaping Use: Never used  Substance and Sexual Activity   Alcohol use: No   Drug use: No   Sexual activity: Yes    Partners: Female  Other Topics Concern   Not on file  Social History Narrative    Lives with wife in a 5 story retirement home.  He lives on the first floor.  4 year college degree.  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     Social Determinants of Health   Financial Resource Strain: Low Risk    Difficulty of Paying Living Expenses: Not hard at all  Food Insecurity: Not on file  Transportation Needs: No Transportation Needs   Lack of Transportation (Medical): No   Lack of Transportation (Non-Medical): No  Physical Activity: Not on file  Stress: No Stress Concern Present   Feeling of Stress : Not at all  Social Connections: Unknown   Frequency of Communication with Friends and Family: Not on file   Frequency of Social Gatherings with Friends and Family: Not on file   Attends Religious Services: Not on file   Active Member of Clubs or Organizations: Not on file   Attends Archivist Meetings: Not on file   Marital Status: Married  Intimate Partner Violence: Not on file    Family History  Problem Relation Age of Onset   Stroke Mother    Arthritis Father    Heart disease Father  Brain cancer Brother        1/2 brother (possibly related to mother NOT related to patient)   Colon cancer Neg Hx    Esophageal cancer Neg Hx    Pancreatic cancer Neg Hx    Stomach cancer Neg Hx    Liver disease Neg Hx      Current Outpatient Medications:    acetaminophen (TYLENOL) 325 MG tablet, Take 650 mg by mouth every 6 (six) hours as needed., Disp: , Rfl:    ALPRAZolam (XANAX) 0.5 MG tablet, Take by mouth., Disp: , Rfl:    Calcium Carb-Cholecalciferol (CALCIUM 1000 + D PO), Take 1 tablet by mouth daily. , Disp: , Rfl:    Cholecalciferol 50 MCG (2000 UT) CAPS, Take 1 capsule by mouth daily., Disp: , Rfl:    Cranberry 1000 MG CAPS, Take 2 tablets by mouth daily., Disp: , Rfl:    CREON 36000-114000 units CPEP capsule, Take by mouth. 2 pills with meal, 1 pill with snack, Disp: , Rfl:    Docusate Sodium (DSS) 100 MG CAPS, Take by mouth., Disp:  , Rfl:    glucosamine-chondroitin 500-400 MG tablet, Take 1 tablet by mouth daily., Disp: , Rfl:    hydrocortisone (ANUSOL-HC) 2.5 % rectal cream, Place 1 application rectally 2 (two) times daily., Disp: 30 g, Rfl: 0   loratadine (CLARITIN) 10 MG tablet, Take 10 mg by mouth daily as needed. , Disp: , Rfl:    metoprolol succinate (TOPROL-XL) 50 MG 24 hr tablet, Take by mouth., Disp: , Rfl:    pantoprazole (PROTONIX) 40 MG tablet, TAKE 1 TABLET BY MOUTH EVERY DAY, Disp: 30 tablet, Rfl: 0   phenazopyridine (PYRIDIUM) 95 MG tablet, Take 95 mg by mouth daily. 2a day, Disp: , Rfl:    Ranibizumab (LUCENTIS IO), Inject into the eye as needed. As needed every three months., Disp: , Rfl:    tamsulosin (FLOMAX) 0.4 MG CAPS capsule, Take by mouth., Disp: , Rfl:    traZODone (DESYREL) 50 MG tablet, TAKE 0.5 TABLETS (25 MG TOTAL) BY MOUTH AT BEDTIME. MAY INCREASE TO FULL TABLET IF NEEDED, Disp: 90 tablet, Rfl: 0   vitamin B-12 (CYANOCOBALAMIN) 1000 MCG tablet, Take 1,000 mcg by mouth in the morning and at bedtime., Disp: , Rfl:    Denosumab (PROLIA [place]), Inject into the skin every 6 (six) months. (Patient not taking: No sig reported), Disp: , Rfl:    lidocaine-prilocaine (EMLA) cream, Apply to affected area once (Patient not taking: No sig reported), Disp: 30 g, Rfl: 3  Physical exam:  Vitals:   03/29/21 0938  BP: 93/73  Pulse: 75  Resp: 16  Temp: 97.7 F (36.5 C)  TempSrc: Oral  Weight: 176 lb 3.2 oz (79.9 kg)  Height: 6\' 4"  (1.93 m)   Physical Exam Constitutional:      General: He is not in acute distress.    Comments: Ambulates with a walker.  Appears in no acute distress  Cardiovascular:     Rate and Rhythm: Normal rate and regular rhythm.     Heart sounds: Normal heart sounds.  Pulmonary:     Effort: Pulmonary effort is normal.     Breath sounds: Normal breath sounds.  Abdominal:     General: Bowel sounds are normal.     Palpations: Abdomen is soft.  Skin:    General: Skin is warm and  dry.  Neurological:     Mental Status: He is alert and oriented to person, place, and time.  CMP Latest Ref Rng & Units 02/15/2021  Glucose 70 - 99 mg/dL 170(H)  BUN 8 - 23 mg/dL 26(H)  Creatinine 0.61 - 1.24 mg/dL 0.92  Sodium 135 - 145 mmol/L 138  Potassium 3.5 - 5.1 mmol/L 4.1  Chloride 98 - 111 mmol/L 100  CO2 22 - 32 mmol/L 29  Calcium 8.9 - 10.3 mg/dL 9.1  Total Protein 6.5 - 8.1 g/dL 7.0  Total Bilirubin 0.3 - 1.2 mg/dL 0.5  Alkaline Phos 38 - 126 U/L 67  AST 15 - 41 U/L 40  ALT 0 - 44 U/L 44   CBC Latest Ref Rng & Units 03/24/2021  WBC 3.4 - 10.8 x10E3/uL 5.5  Hemoglobin 13.0 - 17.7 g/dL 13.6  Hematocrit 37.5 - 51.0 % 41.4  Platelets 150 - 450 x10E3/uL 324     Assessment and plan- Patient is a 87 y.o. male with stage Ib pancreatic adenocarcinoma here to discuss final pathology results and further following issues:  Pancreatic cancer: Patient received 8 weekly gemcitabine and Abraxane chemotherapy treatments neoadjuvantly which would be roughly equal to 3 months of treatment.  We were not able to do treatments 3 weeks on 1 week off due to poor tolerance as well as cytopenias and he landed up getting treatments 1 week on 1 week off.  Despite 8 cycles it was no significant chemotherapy benefit seen in his residual tumor which was 3 cm poorly differentiated with negative margins.  Ideally patients even with stage I pancreatic cancer which is what he has need chemotherapy for a total duration of 6 months.  The absolute benefit of chemotherapy especially at his age is uncertain although there are retrospective studies which continue to show benefit even with stage I disease.  We could consider restarting chemotherapy at the same 1 week on 1 week off regimen for 3-4 more months based on patient's tolerance.  He would like to think about it and let us know if he wants to restart chemotherapy at this time.  Regardless patient would need surveillance scans about every 3 to 6 months and  he prefers to get that done here instead of going to [facility].  I will tentatively see him in 6 weeks with CBC with differential CMP CA 19-9 and scans prior.  If he does decide to treat chemotherapy I will see him sooner  2.  Hemorrhoidal bleed: He is leaning towards getting hemorrhoidal banding done.  3.  Paroxysmal atrial fibrillation: Noted in the postop setting and was started on metoprolol.  Will defer decision to cardiology if his metoprolol can be stopped at this time.  4.  Patient was having recurrent UTIs while on chemotherapy and was seen by Dr. Bernardo Heater.  Cystoscopy was recommended and patient decided to hold off on it until he goes for his surgery.  Patient has not had any UTIs since stopping chemotherapy and he will think about if he wants to proceed with cystoscopy at this time.  He will continue tamsulosin.   Visit Diagnosis 1. Malignant neoplasm of body of pancreas ([place])   2. Goals of care, counseling/discussion      Dr. Randa Evens, MD, MPH [facility] at [facility] [phone] 03/29/2021 12:57 PM

## 2021-03-30 ENCOUNTER — Encounter: Payer: Self-pay | Admitting: Oncology

## 2021-03-30 ENCOUNTER — Telehealth: Payer: Self-pay | Admitting: Internal Medicine

## 2021-03-30 ENCOUNTER — Telehealth: Payer: Self-pay | Admitting: Gastroenterology

## 2021-03-30 NOTE — Telephone Encounter (Signed)
Patient verbalized understanding  

## 2021-03-30 NOTE — Telephone Encounter (Signed)
Pt. Requesting a call back

## 2021-03-30 NOTE — Telephone Encounter (Signed)
Patient is deciding if he needs cancer treatments after his procedure that he had done on 03/26/21. He states that Dr. Janese Banks told him it was up to him and what quality of life he wanted to have because the cancer might come back with the treatments he states. He wants to you to review his recent scans because they have said that he might have cirrhosis of the liver. He states if he has cirrhosis of the liver to he is not going to have treatments he does not think. He states he has to make a decision this week about treatments and wants your input about if he has cirrhosis of the liver.

## 2021-03-30 NOTE — Telephone Encounter (Signed)
Patient called in stating that he has questions about his sleeping medicine and is unsure if he should take them.Please call him at [phone].

## 2021-03-30 NOTE — Telephone Encounter (Signed)
Spoke with pt and he stated that he has a lot going on right now with his pancreatic cancer. He stated that he just had surgery and they removed part of his pancreas, he is having to make a decision as to whether to go back with chemo or not and this is causing pt to not be able to sleep some nights. Pt stated that he has been taking the Trazodone at bedtime and then if he wakes up at 2 or 3 am he takes half an Alprazolam. He stated that sometimes that works and some times it does not. He is wondering if he needs to increase his sleeping medication or change it all together because he has been on it for a while. Pt would like to schedule an appt to discuss but there is no availability.

## 2021-03-30 NOTE — Telephone Encounter (Signed)
Cirrhosis of liver is not a confirmed diagnosis in his case.  The CT scan only showed nodular/bumpy surface.  He does not have any sequelae from cirrhosis of liver.  His liver is functioning normally at 100%. Without any hesitation, if patient decides to undergo chemotherapy, he can proceed from liver standpoint  Sherri Sear, MD

## 2021-03-31 NOTE — Telephone Encounter (Signed)
What time would like for me to add him? You are scheduled from 8am to 1:30pm.

## 2021-04-01 ENCOUNTER — Telehealth: Payer: Self-pay

## 2021-04-01 DIAGNOSIS — C259 Malignant neoplasm of pancreas, unspecified: Secondary | ICD-10-CM | POA: Diagnosis not present

## 2021-04-01 DIAGNOSIS — R2689 Other abnormalities of gait and mobility: Secondary | ICD-10-CM | POA: Diagnosis not present

## 2021-04-01 DIAGNOSIS — M6281 Muscle weakness (generalized): Secondary | ICD-10-CM | POA: Diagnosis not present

## 2021-04-01 DIAGNOSIS — Z48815 Encounter for surgical aftercare following surgery on the digestive system: Secondary | ICD-10-CM | POA: Diagnosis not present

## 2021-04-01 DIAGNOSIS — R2681 Unsteadiness on feet: Secondary | ICD-10-CM | POA: Diagnosis not present

## 2021-04-01 NOTE — Telephone Encounter (Signed)
Nutrition Follow-up:  Patient with stage I pancreatic cancer.  Patient had robot distal pancreatectomy/splenectomy on 02/25/21. Noted contracted COVID following surgery. Patient to decide if he wants to proceed with further chemotherapy.    Spoke with patient's wife as patient doing therapy at the time of my call.  Wife says that patient is eating well post op.  Eating 3 meals per day most often and patient does not want to take creon more frequently.  Patient continues to drink boost [facility] and is tolerating well after surgery.  Wife does not report any particular nutrition concerns at this time.   Medications: creon  Labs: reviewed  Anthropometrics:   Weight 176 lb 3.2 oz on 10/10 184 lb on 8/16 180 lb on 8/1 4% weight loss in the last 2 months    NUTRITION DIAGNOSIS: Unintentional weight loss following surgery but not significant    INTERVENTION:  Continue boost VHC as long as patient able to tolerate orally. Encouraged small frequent meals. Wife has encouraged patient as well but does not want to take creon more frequently Encouraged good sources of protein Contact information given to wife.   Wife will have patient reach out to RD if has any other questions or needs     NEXT VISIT: no follow-up at this time Patient to contact RD if needed  Thorn Demas B. Zenia Resides, [place], [place] Registered Dietitian [phone] (mobile)

## 2021-04-01 NOTE — Telephone Encounter (Signed)
Spoke with pt and he stated that he had a therapy appt to at 2pm. Pt has been scheduled for tomorrow at 8:30am.

## 2021-04-02 ENCOUNTER — Encounter: Payer: Self-pay | Admitting: Oncology

## 2021-04-02 ENCOUNTER — Telehealth (INDEPENDENT_AMBULATORY_CARE_PROVIDER_SITE_OTHER): Payer: PPO | Admitting: Internal Medicine

## 2021-04-02 DIAGNOSIS — F5105 Insomnia due to other mental disorder: Secondary | ICD-10-CM | POA: Diagnosis not present

## 2021-04-02 DIAGNOSIS — I480 Paroxysmal atrial fibrillation: Secondary | ICD-10-CM | POA: Diagnosis not present

## 2021-04-02 DIAGNOSIS — F409 Phobic anxiety disorder, unspecified: Secondary | ICD-10-CM

## 2021-04-02 NOTE — Progress Notes (Signed)
Virtual Visit via [place] Note  This visit type was conducted due to national recommendations for restrictions regarding the COVID-19 pandemic (e.g. social distancing).  This format is felt to be most appropriate for this patient at this time.  All issues noted in this document were discussed and addressed.  No physical exam was performed (except for noted visual exam findings with Video Visits).   I connected withNAME@ on 04/02/21 at  8:30 AM EDT by a video enabled telemedicine application and verified that I am speaking with the correct person using two identifiers. Location patient: home Location provider: work or home office Persons participating in the virtual visit: patient, provider  I discussed the limitations, risks, security and privacy concerns of performing an evaluation and management service by telephone and the availability of in person appointments. I also discussed with the patient that there may be a patient responsible charge related to this service. The patient expressed understanding and agreed to proceed.   Reason for visit: insomnia, anxiety   HPI:  87 yr old male s/p robotic lap resection of pancreatic adenoCA Sept 8 at [facility] complicated by new onset atrial fibrillation,  followed by COVID 19 infection while in skilled RN. Presents for managementof insomnia  1) Insomnia: Using 50 mg trazodone 30 mintues pre bedtime.  Waking up 4 hours later to void,  unable to return to sleep even after taking 0.25 mg alprazolam (1/2 tablet)  2) Pancreatic adenoCA:  facing decision about post resection chemo to prevent recurrence.  Regimen would be same as pre pancreatectomy  3) atrial fib: awaiting results of Zio monitor ,  anxious to stop metoprolol  but not having side effects.     ROS: See pertinent positives and negatives per HPI.  Past Medical History:  Diagnosis Date   Allergy    Aneurysm of abdominal aorta    Basal cell carcinoma    right temple Dr. 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    Past Surgical History:  Procedure Laterality Date   CATARACT EXTRACTION Right    CATARACT EXTRACTION W/PHACO Left 12/26/2017   Procedure: CATARACT EXTRACTION PHACO AND INTRAOCULAR LENS PLACEMENT ([place])  COMPLICATED LEFT;  Surgeon: Leandrew Koyanagi, MD;  Location: [place];  Service: Ophthalmology;  Laterality: Left;  [place]   colonoscopy     ESOPHAGOGASTRODUODENOSCOPY (EGD) WITH PROPOFOL     EYE MUSCLE SURGERY Left    PORTA CATH INSERTION N/A 10/19/2020   Procedure: PORTA CATH INSERTION;  Surgeon: Algernon Huxley, MD;  Location: [place] [facility];  Service: Cardiovascular;  Laterality: N/A;   TONSILLECTOMY AND ADENOIDECTOMY      Family History  Problem Relation Age of Onset   Stroke Mother    Arthritis Father    Heart disease Father    Brain cancer Brother        1/2 brother (possibly related to mother NOT related to patient)   Colon cancer Neg Hx    Esophageal cancer Neg Hx    Pancreatic cancer Neg Hx    Stomach cancer Neg Hx    Liver disease Neg Hx     SOCIAL HX:  reports that he has never smoked. He has never used smokeless tobacco.  He reports that he does not drink alcohol and does not use drugs.    Current Outpatient Medications:    acetaminophen (TYLENOL) 325 MG tablet, Take 650 mg by mouth every 6 (six) hours as needed., Disp: , Rfl:    ALPRAZolam (XANAX) 0.5 MG tablet, Take by mouth., Disp: , Rfl:    Calcium Carb-Cholecalciferol (CALCIUM 1000 + D PO), Take 1 tablet by mouth daily. , Disp: , Rfl:    Cholecalciferol 50 MCG (2000 UT) CAPS, Take 1 capsule by mouth daily., Disp: , Rfl:    Cranberry 1000 MG CAPS, Take 2 tablets by mouth daily., Disp: , Rfl:    CREON 36000-114000 units CPEP capsule, Take by  mouth. 2 pills with meal, 1 pill with snack, Disp: , Rfl:    Denosumab (PROLIA [place]), Inject into the skin every 6 (six) months., Disp: , Rfl:    glucosamine-chondroitin 500-400 MG tablet, Take 1 tablet by mouth daily., Disp: , Rfl:    lidocaine-prilocaine (EMLA) cream, Apply to affected area once, Disp: 30 g, Rfl: 3   loratadine (CLARITIN) 10 MG tablet, Take 10 mg by mouth daily as needed. , Disp: , Rfl:    metoprolol succinate (TOPROL-XL) 50 MG 24 hr tablet, Take by mouth., Disp: , Rfl:    pantoprazole (PROTONIX) 40 MG tablet, TAKE 1 TABLET BY MOUTH EVERY DAY, Disp: 30 tablet, Rfl: 0   Ranibizumab (LUCENTIS [place]), Inject into the eye as needed. As needed every three months., Disp: , Rfl:    tamsulosin (FLOMAX) 0.4 MG CAPS capsule, Take by mouth., Disp: , Rfl:    traZODone (DESYREL) 50 MG tablet, TAKE 0.5 TABLETS (25 MG TOTAL) BY MOUTH AT BEDTIME. MAY INCREASE TO FULL TABLET IF NEEDED, Disp: 90 tablet, Rfl: 0   vitamin B-12 (CYANOCOBALAMIN) 1000 MCG tablet, Take 1,000 mcg by mouth in the morning and at bedtime., Disp: , Rfl:    hydrocortisone (ANUSOL-HC) 2.5 % rectal cream, Place 1 application rectally 2 (two) times daily. (Patient not taking: Reported on 04/02/2021), Disp: 30 g, Rfl: 0   phenazopyridine (PYRIDIUM) 95 MG tablet, Take 95 mg by mouth daily. 2a day (Patient not taking: Reported on 04/02/2021), Disp: , Rfl:   EXAM:  VITALS per patient if applicable:  GENERAL: alert, oriented, appears well and in no acute distress  HEENT: atraumatic, conjunttiva clear, no obvious abnormalities on inspection of external nose and ears  NECK: normal movements of the head and neck  LUNGS: on inspection no signs of respiratory distress, breathing rate appears normal, no obvious gross SOB, gasping or wheezing  CV: no obvious cyanosis  MS: moves all visible extremities without noticeable abnormality  PSYCH/NEURO: pleasant and cooperative, no obvious depression or anxiety, speech and thought processing  grossly intact  ASSESSMENT AND PLAN:  Discussed the following assessment and plan:  Insomnia due to anxiety and fear  Paroxysmal A-fib (HCC)  Insomnia due to anxiety and fear Aggravated by medication conditions including pancreatic cancer Advised to increase trazodone to 100 mg one hour before bedtime .  Advised that if he remains wide awake after one hour he can add 1/2 tablet of alprazolam   Paroxysmal A-fib ([place]) Continue use of metoprolol pending evaluation of results of Zio monitor.      I discussed the assessment and treatment plan with the patient. The patient was provided an opportunity to ask questions and all were answered. The patient agreed with the plan and demonstrated an understanding of the instructions.   The patient was advised to  call back or seek an in-person evaluation if the symptoms worsen or if the condition fails to improve as anticipated.   I spent 30 minutes dedicated to the care of this patient on the date of this encounter to include pre-visit review of his medical history,  Face-to-face time with the patient , and post visit ordering of testing and therapeutics.    Crecencio Mc, MD

## 2021-04-02 NOTE — Patient Instructions (Addendum)
You can increase the trazodone dose up to 100 mg nightly.  You can take it 1 hour before bedtime if it works,  to reduce the morning grogginess you may experience  You can still use th 1/2 tablet of alprazolam if you are unable to return to sleep in 15 minutes after gettting up to  void

## 2021-04-04 DIAGNOSIS — F409 Phobic anxiety disorder, unspecified: Secondary | ICD-10-CM | POA: Insufficient documentation

## 2021-04-04 DIAGNOSIS — F5105 Insomnia due to other mental disorder: Secondary | ICD-10-CM | POA: Insufficient documentation

## 2021-04-04 NOTE — Assessment & Plan Note (Signed)
Continue use of metoprolol pending evaluation of results of Zio monitor.

## 2021-04-04 NOTE — Assessment & Plan Note (Signed)
Aggravated by medication conditions including pancreatic cancer Advised to increase trazodone to 100 mg one hour before bedtime .  Advised that if he remains wide awake after one hour he can add 1/2 tablet of alprazolam

## 2021-04-05 DIAGNOSIS — M6281 Muscle weakness (generalized): Secondary | ICD-10-CM | POA: Diagnosis not present

## 2021-04-05 DIAGNOSIS — Z48815 Encounter for surgical aftercare following surgery on the digestive system: Secondary | ICD-10-CM | POA: Diagnosis not present

## 2021-04-05 DIAGNOSIS — R2681 Unsteadiness on feet: Secondary | ICD-10-CM | POA: Diagnosis not present

## 2021-04-05 DIAGNOSIS — R2689 Other abnormalities of gait and mobility: Secondary | ICD-10-CM | POA: Diagnosis not present

## 2021-04-05 DIAGNOSIS — C259 Malignant neoplasm of pancreas, unspecified: Secondary | ICD-10-CM | POA: Diagnosis not present

## 2021-04-06 ENCOUNTER — Telehealth: Payer: Self-pay

## 2021-04-06 ENCOUNTER — Other Ambulatory Visit: Payer: Self-pay

## 2021-04-06 MED ORDER — METOPROLOL SUCCINATE ER 50 MG PO TB24: 75.0000 mg | ORAL_TABLET | Freq: Every day | ORAL | 1 refills | Status: DC

## 2021-04-07 ENCOUNTER — Other Ambulatory Visit: Payer: Self-pay

## 2021-04-07 ENCOUNTER — Ambulatory Visit (INDEPENDENT_AMBULATORY_CARE_PROVIDER_SITE_OTHER): Payer: PPO | Admitting: Gastroenterology

## 2021-04-07 ENCOUNTER — Encounter: Payer: Self-pay | Admitting: Gastroenterology

## 2021-04-07 VITALS — BP 122/65 | HR 68 | Temp 97.9°F | Ht 76.0 in | Wt 178.8 lb

## 2021-04-07 DIAGNOSIS — K641 Second degree hemorrhoids: Secondary | ICD-10-CM | POA: Diagnosis not present

## 2021-04-07 DIAGNOSIS — K644 Residual hemorrhoidal skin tags: Secondary | ICD-10-CM

## 2021-04-07 NOTE — Progress Notes (Signed)
Cephas Darby, MD [address]  [place]  [place], [place] [address]  Main: [phone]  Fax: [phone]    Gastroenterology Consultation  Referring Provider:     Crecencio Mc, MD Primary Care Physician:  Crecencio Mc, MD Primary Gastroenterologist:  Dr. Cephas Darby Reason for Consultation:    Exocrine pancreatic insufficiency, pancreatic cancer, rectal bleeding       HPI:   George Owens is a 87 y.o. male referred by Dr. Crecencio Mc, MD  for consultation & management of altered bowel habits and abdominal bloating.  Patient reports that he has been experiencing abdominal bloating, particularly postprandial associated with variable stool consistency anywhere from normal caliber to pencil thin and sometimes hard.  He underwent colonoscopy in 4/21 which revealed polyps and diverticulosis of the sigmoid colon as well as internal hemorrhoids.  Patient also had rectal pain, was treated with hemorrhoid cream.  He is not concerned about rectal pain today.  Patient is also diagnosed with cirrhosis of liver based on CT chest and followed by ultrasound liver.  He does have new onset of mild thrombocytopenia, 142 in 4/21.  Patient underwent work-up for secondary liver disease, found to have ANA positive and elevated ferritin <500  Follow-up visit 10/06/2020 Patient is diagnosed with adenocarcinoma of pancreas, stage I, resectable, planning to start neoadjuvant chemo.  He continues to have abdominal bloating and loose stools, primarily postprandial.  His weight is stable.  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  He is currently undergoing neoadjuvant chemo for stage I pancreatic adenocarcinoma.  Patient is also scheduled to undergo pancreaticobiliary surgery in September.  Fortunately patient's weight has been stable in last 3 months.  He finds Creon to be expensive and was denied from patient assistance program.  However,  he manages to take Creon 4 to 5 pills daily.  He wants to know if he can take over-the-counter digestive enzymes that has low-dose lipase.  He reports having abdominal bloating as well as up to 6 soft bowel movements every time he urinates.  Patient is also consuming whole milk daily.  He is accompanied by his wife today  Follow-up visit 03/24/2021 Patient underwent robotic assisted laparoscopic distal pancreatectomy and splenectomy on 02/25/2021.  He recovered well from surgery.  He has been tolerating p.o. well and able to maintain weight.  He restarted Creon.  Because of cost issues, patient has cut back on the number of Creon pills per day to 1 or 2 pills.  He did notice that his diarrhea recurs if he does not take Creon with meals.  Patient made an urgent visit to see me because last Saturday, he started noticing bright red blood per rectum on wiping, in toilet bowel.  He was taking Lovenox injections postoperatively and his last dose was on Monday.  He thought his hemorrhoids were inflamed and he noticed some bloody discharge.  He tried over-the-counter Preparation H.  Because of the bleeding, he went to the ER.  He was diagnosed with inflamed external hemorrhoid causing rectal pain.  He was advised to continue Preparation H twice daily and add zinc oxide paste, as well as ibuprofen 400-600 mg every 6 hours.  Patient reports that rectal discomfort has significantly improved, bleeding has slowed down.  He has been using wet wipes and water to clean the area after a BM.  His bowel movements are more or less formed.  Follow-up visit 08/08/2020 Patient is  here for follow-up of hemorrhoidal symptoms.  He has been applying Anusol cream twice daily which provided relief.  After he stopped Anusol cream, he states that his symptoms recurred but not as severe.  He denies rectal bleeding.  His weight has been stable, he managed to get Creon down to 1 capsule daily.  I discussed with him about hemorrhoid banding but he  is out of town to [place] on Friday and planning to start chemotherapy next week.  NSAIDs: None  Antiplts/Anticoagulants/Anti thrombotics: None  GI Procedures:  Upper endoscopy 11/29/2018 - Normal esophagus. - No endoscopic esophageal abnormality to explain patient's dysphagia. Dilation attempted, but aborted due to resistance with Savary and known Zenker's diverticulum. - Multiple gastric polyps. Biopsied. - Normal examined duodenum.  Surgical [P], gastric polyp BX - HYPERPLASTIC POLYP. - WARTHIN-STARRY IS NEGATIVE FOR HELICOBACTER PYLORI. - NO INTESTINAL METAPLASIA, DYSPLASIA, OR MALIGNANCY.  Colonoscopy 10/09/2019 - Nine 3 to 6 mm polyps in the sigmoid colon, in the ascending colon and in the cecum, removed with a cold snare. Resected and retrieved. - Diverticulosis in the sigmoid colon. - Non-bleeding internal hemorrhoids.  Diagnosis Surgical [P], colon, ascending and cecum, sigmoid, polyp (9) - TUBULAR ADENOMA(S) WITHOUT HIGH-GRADE DYSPLASIA OR MALIGNANCY - INFLAMMATORY POLYP(S)  Past Medical History:  Diagnosis Date   Allergy    Aneurysm of abdominal aorta    Basal cell carcinoma    right temple Dr. 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    Past Surgical History:  Procedure Laterality Date   CATARACT EXTRACTION Right    CATARACT EXTRACTION W/PHACO Left 12/26/2017   Procedure: CATARACT EXTRACTION PHACO AND INTRAOCULAR LENS PLACEMENT ([place])  COMPLICATED LEFT;  Surgeon: Leandrew Koyanagi, MD;  Location: [facility];  Service: Ophthalmology;  Laterality: Left;  [place]   colonoscopy     ESOPHAGOGASTRODUODENOSCOPY (EGD) WITH PROPOFOL     EYE MUSCLE SURGERY Left    PORTA CATH INSERTION N/A 10/19/2020   Procedure: PORTA CATH INSERTION;   Surgeon: Algernon Huxley, MD;  Location: [facility];  Service: Cardiovascular;  Laterality: N/A;   TONSILLECTOMY AND ADENOIDECTOMY      Current Outpatient Medications:    acetaminophen (TYLENOL) 325 MG tablet, Take 650 mg by mouth every 6 (six) hours as needed., Disp: , Rfl:    acetaminophen (TYLENOL) 325 MG tablet, Take by mouth., Disp: , Rfl:    ALPRAZolam (XANAX) 0.5 MG tablet, Take by mouth., Disp: , Rfl:    Calcium Carb-Cholecalciferol (CALCIUM 1000 + D PO), Take 1 tablet by mouth daily. , Disp: , Rfl:    Cholecalciferol 50 MCG (2000 UT) CAPS, Take 1 capsule by mouth daily., Disp: , Rfl:    Cranberry 1000 MG CAPS, Take 2 tablets by mouth daily., Disp: , Rfl:    CREON 36000-114000 units CPEP capsule, Take by mouth. 2 pills with meal, 1 pill with snack, Disp: , Rfl:    Denosumab (PROLIA [place]), Inject into the skin every 6 (six) months., Disp: , Rfl:    glucosamine-chondroitin 500-400 MG tablet, Take 1 tablet by mouth daily., Disp: , Rfl:    lidocaine-prilocaine (EMLA) cream, Apply to affected area once, Disp: 30 g, Rfl: 3   loratadine (CLARITIN) 10 MG  tablet, Take 10 mg by mouth daily as needed. , Disp: , Rfl:    metoprolol succinate (TOPROL-XL) 50 MG 24 hr tablet, Take by mouth., Disp: , Rfl:    metoprolol succinate (TOPROL-XL) 50 MG 24 hr tablet, Take 1.5 tablets (75 mg total) by mouth daily., Disp: 30 tablet, Rfl: 1   pantoprazole (PROTONIX) 40 MG tablet, TAKE 1 TABLET BY MOUTH EVERY DAY, Disp: 30 tablet, Rfl: 0   Ranibizumab (LUCENTIS IO), Inject into the eye as needed. As needed every three months., Disp: , Rfl:    tamsulosin (FLOMAX) 0.4 MG CAPS capsule, Take by mouth., Disp: , Rfl:    traZODone (DESYREL) 50 MG tablet, TAKE 0.5 TABLETS (25 MG TOTAL) BY MOUTH AT BEDTIME. MAY INCREASE TO FULL TABLET IF NEEDED, Disp: 90 tablet, Rfl: 0   vitamin B-12 (CYANOCOBALAMIN) 1000 MCG tablet, Take 1,000 mcg by mouth in the morning and at bedtime., Disp: , Rfl:    hydrocortisone (ANUSOL-HC)  2.5 % rectal cream, Place 1 application rectally 2 (two) times daily. (Patient not taking: No sig reported), Disp: 30 g, Rfl: 0   phenazopyridine (PYRIDIUM) 95 MG tablet, Take 95 mg by mouth daily. 2a day (Patient not taking: No sig reported), Disp: , Rfl:    Family History  Problem Relation Age of Onset   Stroke Mother    Arthritis Father    Heart disease Father    Brain cancer Brother        1/2 brother (possibly related to mother NOT related to patient)   Colon cancer Neg Hx    Esophageal cancer Neg Hx    Pancreatic cancer Neg Hx    Stomach cancer Neg Hx    Liver disease Neg Hx      Social History   Tobacco Use   Smoking status: Never   Smokeless tobacco: Never  Vaping Use   Vaping Use: Never used  Substance Use Topics   Alcohol use: No   Drug use: No    Allergies as of 04/07/2021 - Review Complete 04/07/2021  Allergen Reaction Noted   Kenalog [triamcinolone acetonide]  12/13/2012    Review of Systems:    All systems reviewed and negative except where noted in HPI.   Physical Exam:  BP 122/65 (BP Location: Left Arm, Patient Position: Sitting, Cuff Size: Normal)   Pulse 68   Temp 97.9 F (36.6 C) (Oral)   Ht 6\' 4"  (1.93 m)   Wt 178 lb 12.8 oz (81.1 kg)   BMI 21.76 kg/m  No LMP for male patient.  General:   Alert,  Well-developed, well-nourished, pleasant and cooperative in NAD Head:  Normocephalic and atraumatic. Eyes:  Sclera clear, no icterus.   Conjunctiva pink. Ears:  Normal auditory acuity. Nose:  No deformity, discharge, or lesions. Mouth:  No deformity or lesions,oropharynx pink & moist. Neck:  Supple; no masses or thyromegaly. Lungs:  Respirations even and unlabored.  Clear throughout to auscultation.   No wheezes, crackles, or rhonchi. No acute distress. Heart:  Regular rate and rhythm; no murmurs, clicks, rubs, or gallops. Abdomen:  Normal bowel sounds. Soft, non-tender and mildly distended, tympanic without masses, hepatosplenomegaly or hernias  noted.  No guarding or rebound tenderness.   Rectal: Perianal skin tags, nontender, do not appear inflamed.  Mild tenderness on digital rectal exam, brown stool in rectal vault Msk:  Symmetrical without gross deformities. Good, equal movement & strength bilaterally. Pulses:  Normal pulses noted. Extremities:  No clubbing or edema.  No cyanosis. Neurologic:  Alert and oriented x3;  grossly normal neurologically. Skin:  Intact without significant lesions or rashes. No jaundice. Psych:  Alert and cooperative. Normal mood and affect.  Imaging Studies: Reviewed  Assessment and Plan:   CHONG WOJDYLA is a 87 y.o. Caucasian male with stage I pancreatic adenocarcinoma on neoadjuvant chemo and ?Cirrhosis of liver is seen as an urgent visit for rectal bleeding  Rectal bleeding associated with rectal discomfort Patient already had a colonoscopy in 09/2019 which was unremarkable except for polyps and sigmoid diverticulosis and internal hemorrhoids.   Likely secondary to symptomatic hemorrhoids and inflamed skin tags Continue Anusol cream twice daily up to 7 to 10 days during flareup hemoglobin normal I have also discussed about outpatient hemorrhoid ligation if his symptoms are persistent despite using Anusol cream.  Patient will be out of town this Friday, I do not recommend to perform hemorrhoid ligation today.  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  Patient can also take over-the-counter digestive enzymes as he finds Creon to be very expensive. Explained to the patient that he will stay on pancreatic enzymes indefinitely and  H. pylori breath test is negative Also, advised him to try lactose-free diet given ongoing bloating and loose stools which  he is at risk given his advanced age   ?Cirrhosis of liver: Ultrasound liver revealed nodularity of the liver with no evidence of portal vein thrombosis.  Recent CT revealed lobulated liver surface only.  If at all he has cirrhosis, etiology is likely secondary to NASH, appears to be well compensated child A LFTs normal, no evidence of hypoalbuminemia or ascites Secondary liver disease work-up is negative except for positive ANA, viral hepatitis panel negative No evidence of thrombocytopenia or anemia No evidence of varices LFTs are normal Recommend low-sodium diet HCC screening: No evidence of liver lesions based on recent CT scan in 3/22   Follow up as needed   Cephas Darby, MD

## 2021-04-08 ENCOUNTER — Telehealth: Payer: Self-pay

## 2021-04-08 DIAGNOSIS — M6281 Muscle weakness (generalized): Secondary | ICD-10-CM | POA: Diagnosis not present

## 2021-04-08 DIAGNOSIS — Z48815 Encounter for surgical aftercare following surgery on the digestive system: Secondary | ICD-10-CM | POA: Diagnosis not present

## 2021-04-08 DIAGNOSIS — R2689 Other abnormalities of gait and mobility: Secondary | ICD-10-CM | POA: Diagnosis not present

## 2021-04-08 DIAGNOSIS — C259 Malignant neoplasm of pancreas, unspecified: Secondary | ICD-10-CM | POA: Diagnosis not present

## 2021-04-08 DIAGNOSIS — R2681 Unsteadiness on feet: Secondary | ICD-10-CM | POA: Diagnosis not present

## 2021-04-08 NOTE — Telephone Encounter (Signed)
Copy of [facility] referrals faxed to [facility] as requested. Fax [phone]. Confirmation received.

## 2021-04-12 ENCOUNTER — Encounter: Payer: Self-pay | Admitting: Oncology

## 2021-04-13 DIAGNOSIS — Z48815 Encounter for surgical aftercare following surgery on the digestive system: Secondary | ICD-10-CM | POA: Diagnosis not present

## 2021-04-13 DIAGNOSIS — R2681 Unsteadiness on feet: Secondary | ICD-10-CM | POA: Diagnosis not present

## 2021-04-13 DIAGNOSIS — M6281 Muscle weakness (generalized): Secondary | ICD-10-CM | POA: Diagnosis not present

## 2021-04-13 DIAGNOSIS — R2689 Other abnormalities of gait and mobility: Secondary | ICD-10-CM | POA: Diagnosis not present

## 2021-04-13 DIAGNOSIS — C259 Malignant neoplasm of pancreas, unspecified: Secondary | ICD-10-CM | POA: Diagnosis not present

## 2021-04-14 ENCOUNTER — Inpatient Hospital Stay: Payer: PPO

## 2021-04-14 ENCOUNTER — Other Ambulatory Visit: Payer: Self-pay

## 2021-04-14 ENCOUNTER — Ambulatory Visit: Payer: PPO | Admitting: Gastroenterology

## 2021-04-14 ENCOUNTER — Ambulatory Visit: Payer: PPO

## 2021-04-14 ENCOUNTER — Inpatient Hospital Stay (HOSPITAL_BASED_OUTPATIENT_CLINIC_OR_DEPARTMENT_OTHER): Payer: PPO | Admitting: Oncology

## 2021-04-14 ENCOUNTER — Encounter: Payer: Self-pay | Admitting: Oncology

## 2021-04-14 VITALS — BP 106/63 | HR 70 | Temp 97.0°F | Resp 16 | Wt 178.2 lb

## 2021-04-14 DIAGNOSIS — Z5111 Encounter for antineoplastic chemotherapy: Secondary | ICD-10-CM | POA: Diagnosis not present

## 2021-04-14 DIAGNOSIS — C251 Malignant neoplasm of body of pancreas: Secondary | ICD-10-CM | POA: Diagnosis not present

## 2021-04-14 LAB — COMPREHENSIVE METABOLIC PANEL
ALT: 35 U/L (ref 0–44)
AST: 36 U/L (ref 15–41)
Albumin: 4 g/dL (ref 3.5–5.0)
Alkaline Phosphatase: 53 U/L (ref 38–126)
Anion gap: 9 (ref 5–15)
BUN: 19 mg/dL (ref 8–23)
CO2: 28 mmol/L (ref 22–32)
Calcium: 9 mg/dL (ref 8.9–10.3)
Chloride: 100 mmol/L (ref 98–111)
Creatinine, Ser: 0.88 mg/dL (ref 0.61–1.24)
GFR, Estimated: >60 mL/min (ref >60)
Glucose, Bld: 167 mg/dL — ABNORMAL HIGH (ref 70–99)
Potassium: 3.8 mmol/L (ref 3.5–5.1)
Sodium: 137 mmol/L (ref 135–145)
Total Bilirubin: 0.7 mg/dL (ref 0.3–1.2)
Total Protein: 6.8 g/dL (ref 6.5–8.1)

## 2021-04-14 LAB — CBC WITH DIFFERENTIAL/PLATELET
Abs Immature Granulocytes: 0 10*3/uL (ref 0.00–0.07)
Basophils Absolute: 0 10*3/uL (ref 0.0–0.1)
Basophils Relative: 1 %
Eosinophils Absolute: 0.1 10*3/uL (ref 0.0–0.5)
Eosinophils Relative: 2 %
HCT: 41.5 % (ref 39.0–52.0)
Hemoglobin: 13.9 g/dL (ref 13.0–17.0)
Immature Granulocytes: 0 %
Lymphocytes Relative: 33 %
Lymphs Abs: 1.7 10*3/uL (ref 0.7–4.0)
MCH: 30 pg (ref 26.0–34.0)
MCHC: 33.5 g/dL (ref 30.0–36.0)
MCV: 89.6 fL (ref 80.0–100.0)
Monocytes Absolute: 0.7 10*3/uL (ref 0.1–1.0)
Monocytes Relative: 14 %
Neutro Abs: 2.7 10*3/uL (ref 1.7–7.7)
Neutrophils Relative %: 50 %
Platelets: 268 10*3/uL (ref 150–400)
RBC: 4.63 MIL/uL (ref 4.22–5.81)
RDW: 13.9 % (ref 11.5–15.5)
WBC: 5.2 10*3/uL (ref 4.0–10.5)
nRBC: 0 % (ref 0.0–0.2)

## 2021-04-14 MED ORDER — TAMSULOSIN HCL 0.4 MG PO CAPS: 0.4000 mg | ORAL_CAPSULE | Freq: Every day | ORAL | 0 refills | Status: DC

## 2021-04-14 MED ORDER — SODIUM CHLORIDE 0.9 % IV SOLN
Freq: Once | INTRAVENOUS | Status: AC
Start: 2021-04-14 — End: 2021-04-14
  Filled 2021-04-14: qty 250

## 2021-04-14 MED ORDER — PACLITAXEL PROTEIN-BOUND CHEMO INJECTION 100 MG
80.0000 mg/m2 | Freq: Once | INTRAVENOUS | Status: AC
  Administered 2021-04-14: 175 mg via INTRAVENOUS
  Filled 2021-04-14: qty 35

## 2021-04-14 MED ORDER — PROCHLORPERAZINE MALEATE 10 MG PO TABS
10.0000 mg | ORAL_TABLET | Freq: Once | ORAL | Status: AC
  Administered 2021-04-14: 10 mg via ORAL
  Filled 2021-04-14: qty 1

## 2021-04-14 MED ORDER — SODIUM CHLORIDE 0.9 % IV SOLN
700.0000 mg/m2 | Freq: Once | INTRAVENOUS | Status: AC
  Administered 2021-04-14: 1520 mg via INTRAVENOUS
  Filled 2021-04-14: qty 26.3

## 2021-04-14 MED ORDER — HEPARIN SOD (PORK) LOCK FLUSH 100 UNIT/ML IV SOLN
INTRAVENOUS | Status: AC
  Filled 2021-04-14: qty 5

## 2021-04-14 NOTE — Progress Notes (Signed)
Hematology/Oncology Consult note [facility]  [phone] [phone]  Patient Care Team: Crecencio Mc, MD as PCP - General (Internal Medicine) Clent Jacks, RN as Oncology Nurse Navigator   Name of the patient: George Owens  [id number]  11/28/1931   Date of visit: 04/14/21  Diagnosis- pancreatic adenocarcinoma stage I BC T2N 0M0  Chief complaint/ Reason for visit-on treatment assessment prior to cycle 1 day 1 of adjuvant gemcitabine Abraxane  Heme/Onc history: patient is a 87 year old male with a past medical history significant for GERD, hyperlipidemia, cirrhosis.  He was having symptoms of Abdominal pain and reflux and therefore underwent a CT abdomen and pelvis with contrast on 09/11/2020 which showed a hypointense ill-defined mass in the pancreatic body up to 25 mm.  No extrapancreatic infiltrative density about the aorta or proximal [name].  No local regional adenopathy or distant metastatic disease.  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  This was followed by an EUS at [facility] which showed a hypoechoic mass measuring 1.8 x 1.6 cm in the pancreatic body.  Endosonographic borders poorly defined.  Upstream pancreatic duct dilatation.  No abnormal appearing lymph nodes.  Endosonographic imaging of the liver showed no lesion.  Biopsy showed adenocarcinoma.   PET scan showed 2.7 x 2 cm ovoid mass in the body of the pancreas with an SUV of 7.1.  18 mm hypermetabolic nodule in the isthmus of the thyroid gland.  No evidence of local regional adenopathy or distant metastatic disease.   Patient was evaluated by Dr. Hyman Hopes at [facility] for consideration of surgery and has been deemed to be upfront resectable surgical candidate.  However plan was to offer him neoadjuvant chemotherapy for 3 months to control micrometastatic disease before proceeding with definitive surgery.  If ultimately patient decides not to proceed with surgery then radiation at [facility]  also remains an option.   Patient received 8 doses of gemcitabine and Abraxane chemotherapy 1 week on 1 week off due to neutropenia and thrombocytopenia.  Plan is to proceed with definitive surgery at this time as patient was also getting recurrent UTIs on chemotherapy.  Depending on his recovery after surgery further adjuvant chemotherapy can be considered   Patient underwent laparoscopy surgery by Dr. Hyman Hopes at [facility].  Final pathology showed 3 cm grade 3 poorly differentiated adenocarcinoma of the pancreas with negative margins.  15 lymph nodes negative for malignancy.  Absent chemotherapy response with extensive residual cancer and no evidence of tumor regression.  Perineural invasion identified lymphovascular invasion not identified.  Plan is to proceed with 4 cycles of adjuvant gemcitabine Abraxane 1 week on 1 week off if tolerated    Interval history-he still has some rectal discomfort for which she is using Preparation H but denies any rectal bleeding.  He has also been using Creon for his digestive issues.  ECOG PS- 1 Pain scale- 0   Review of systems- Review of Systems  Constitutional:  Negative for chills, fever, malaise/fatigue and weight loss.  HENT:  Negative for congestion, ear discharge and nosebleeds.   Eyes:  Negative for blurred vision.  Respiratory:  Negative for cough, hemoptysis, sputum production, shortness of breath and wheezing.   Cardiovascular:  Negative for chest pain, palpitations, orthopnea and claudication.  Gastrointestinal:  Negative for abdominal pain, blood in stool, constipation, diarrhea, heartburn, melena, nausea and vomiting.  Genitourinary:  Negative for dysuria, flank pain, frequency, hematuria and urgency.  Musculoskeletal:  Negative for back pain, joint pain and myalgias.  Skin:  Negative for rash.  Neurological:  Negative for dizziness, tingling, focal weakness, seizures, weakness and headaches.  Endo/Heme/Allergies:  Does not bruise/bleed easily.   Psychiatric/Behavioral:  Negative for depression and suicidal ideas. The patient does not have insomnia.      Allergies  Allergen Reactions   Kenalog [Triamcinolone Acetonide]     Blindness X 3 Days     Past Medical History:  Diagnosis Date   Allergy    Aneurysm of abdominal aorta    Basal cell carcinoma    right temple Dr. Evorn Gong 12/2018   Chicken pox    Cirrhosis of liver ([place]) 05/12/2020   Colon polyps    COVID-19 virus infection 03/16/2020   GERD (gastroesophageal reflux disease)    history of prostate CA 2006   Hyperlipidemia    Leg cramps    Neuropathy    Osteoporosis    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     Past Surgical History:  Procedure Laterality Date   CATARACT EXTRACTION Right    CATARACT EXTRACTION W/PHACO Left 12/26/2017   Procedure: CATARACT EXTRACTION PHACO AND INTRAOCULAR LENS PLACEMENT ([place])  COMPLICATED LEFT;  Surgeon: Leandrew Koyanagi, MD;  Location: [facility];  Service: Ophthalmology;  Laterality: Left;  [place]   colonoscopy     ESOPHAGOGASTRODUODENOSCOPY (EGD) WITH PROPOFOL     EYE MUSCLE SURGERY Left    PORTA CATH INSERTION N/A 10/19/2020   Procedure: PORTA CATH INSERTION;  Surgeon: Algernon Huxley, MD;  Location: [facility];  Service: Cardiovascular;  Laterality: N/A;   TONSILLECTOMY AND ADENOIDECTOMY      Social History   Socioeconomic History   Marital status: Married    Spouse name: Eloise   Number of children: 4   Years of education: Not on file   Highest education level: Not on file  Occupational History   Occupation: retired  Tobacco Use   Smoking status: Never   Smokeless tobacco: Never  Vaping Use   Vaping Use: Never used  Substance and Sexual Activity   Alcohol use: No   Drug use: No   Sexual activity: Yes    Partners: Female  Other Topics Concern   Not on file  Social History Narrative   Lives with wife in a 5 story retirement home.  He lives on the first floor.  4 year college degree.   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     Social Determinants of Health   Financial Resource Strain: Not on file  Food Insecurity: Not on file  Transportation Needs: Not on file  Physical Activity: Not on file  Stress: Not on file  Social Connections: Not on file  Intimate Partner Violence: Not on file    Family History  Problem Relation Age of Onset   Stroke Mother    Arthritis Father    Heart disease Father    Brain cancer Brother        1/2 brother (possibly related to mother NOT related to patient)   Colon cancer Neg Hx    Esophageal cancer Neg Hx    Pancreatic cancer Neg Hx    Stomach cancer Neg Hx    Liver disease Neg Hx      Current Outpatient Medications:    acetaminophen (TYLENOL) 325 MG tablet, Take 650 mg by mouth every 6 (six) hours as needed., Disp: , Rfl:    ALPRAZolam (XANAX) 0.25 MG tablet, Take by mouth., Disp: , Rfl:    Calcium  Carb-Cholecalciferol (CALCIUM 1000 + D PO), Take 1 tablet by mouth daily. , Disp: , Rfl:    Cholecalciferol 50 MCG (2000 UT) CAPS, Take 1 capsule by mouth daily., Disp: , Rfl:    Cranberry 1000 MG CAPS, Take 2 tablets by mouth daily., Disp: , Rfl:    CREON 36000-114000 units CPEP capsule, Take by mouth. 2 pills with meal, 1 pill with snack, Disp: , Rfl:    Denosumab (PROLIA [place]), Inject into the skin every 6 (six) months., Disp: , Rfl:    glucosamine-chondroitin 500-400 MG tablet, Take 1 tablet by mouth daily., Disp: , Rfl:    lidocaine-prilocaine (EMLA) cream, Apply to affected area once, Disp: 30 g, Rfl: 3   loratadine (CLARITIN) 10 MG tablet, Take 10 mg by mouth daily as needed. , Disp: , Rfl:    metoprolol succinate (TOPROL-XL) 50 MG 24 hr tablet, Take 1.5 tablets (75 mg total) by mouth daily., Disp: 30 tablet, Rfl: 1   pantoprazole (PROTONIX) 40 MG tablet, TAKE 1 TABLET BY MOUTH EVERY DAY, Disp: 30 tablet, Rfl: 0   Ranibizumab (LUCENTIS IO), Inject into the eye as needed. As needed every three months.,  Disp: , Rfl:    tamsulosin (FLOMAX) 0.4 MG CAPS capsule, Take by mouth., Disp: , Rfl:    traZODone (DESYREL) 50 MG tablet, TAKE 0.5 TABLETS (25 MG TOTAL) BY MOUTH AT BEDTIME. MAY INCREASE TO FULL TABLET IF NEEDED, Disp: 90 tablet, Rfl: 0   vitamin B-12 (CYANOCOBALAMIN) 1000 MCG tablet, Take 1,000 mcg by mouth in the morning and at bedtime., Disp: , Rfl:    acetaminophen (TYLENOL) 325 MG tablet, Take by mouth., Disp: , Rfl:    ALPRAZolam (XANAX) 0.5 MG tablet, Take by mouth. (Patient not taking: Reported on 04/14/2021), Disp: , Rfl:    hydrocortisone (ANUSOL-HC) 2.5 % rectal cream, Place 1 application rectally 2 (two) times daily. (Patient not taking: No sig reported), Disp: 30 g, Rfl: 0   metoprolol succinate (TOPROL-XL) 50 MG 24 hr tablet, Take by mouth. (Patient not taking: Reported on 04/14/2021), Disp: , Rfl:    phenazopyridine (PYRIDIUM) 95 MG tablet, Take 95 mg by mouth daily. 2a day (Patient not taking: No sig reported), Disp: , Rfl:   Physical exam:  Vitals:   04/14/21 0909  BP: 106/63  Pulse: 70  Resp: 16  Temp: (!) 97 F (36.1 C)  SpO2: 99%  Weight: 178 lb 3.2 oz (80.8 kg)   Physical Exam Cardiovascular:     Rate and Rhythm: Normal rate and regular rhythm.     Heart sounds: Normal heart sounds.  Pulmonary:     Effort: Pulmonary effort is normal.     Breath sounds: Normal breath sounds.  Abdominal:     General: Bowel sounds are normal.     Palpations: Abdomen is soft.  Skin:    General: Skin is warm and dry.  Neurological:     Mental Status: He is alert and oriented to person, place, and time.     CMP Latest Ref Rng & Units 02/15/2021  Glucose 70 - 99 mg/dL 170(H)  BUN 8 - 23 mg/dL 26(H)  Creatinine 0.61 - 1.24 mg/dL 0.92  Sodium 135 - 145 mmol/L 138  Potassium 3.5 - 5.1 mmol/L 4.1  Chloride 98 - 111 mmol/L 100  CO2 22 - 32 mmol/L 29  Calcium 8.9 - 10.3 mg/dL 9.1  Total Protein 6.5 - 8.1 g/dL 7.0  Total Bilirubin 0.3 - 1.2 mg/dL 0.5  Alkaline Phos 38 - 126  U/L  67  AST 15 - 41 U/L 40  ALT 0 - 44 U/L 44   CBC Latest Ref Rng & Units 04/14/2021  WBC 4.0 - 10.5 K/uL 5.2  Hemoglobin 13.0 - 17.0 g/dL 13.9  Hematocrit 39.0 - 52.0 % 41.5  Platelets 150 - 400 K/uL 268     Assessment and plan- Patient is a 87 y.o. male with stage Ib pancreatic adenocarcinoma s/p 3 months of neoadjuvant gemcitabine Abraxane chemotherapy here for on treatment assessment prior to cycle 1 day 1 of gemcitabine Abraxane  After discussing pros and cons of adjuvant chemotherapy patient has decided to proceed with adjuvant treatment based on how he tolerates it.  Ideally I would like to give him 4 cycles 1 week on 1 week off.  Counts otherwise okay to proceed with cycle 1 day 1 of gemcitabine Abraxane adjuvantly today.  I will hold off on giving him on pro today but based on labs in 2 weeks I will proceed with that.  He is receiving reduced dose of gemcitabine at 700 mg per metered squared and Abraxane at 80 mg per metered square based on his previous tolerance and counts.  I will see him back in 2 weeks for cycle 1 day 15 of chemotherapy.   Visit Diagnosis 1. Encounter for antineoplastic chemotherapy   2. Malignant neoplasm of body of pancreas ([place])      Dr. Randa Evens, MD, MPH [facility] at [facility] [phone] 04/14/2021 9:12 AM

## 2021-04-14 NOTE — Patient Instructions (Signed)
[facility]  Discharge Instructions: Thank you for choosing [facility] to provide your oncology and hematology care.  If you have a lab appointment with the [facility], please go directly to the [facility] and check in at the registration area.  Wear comfortable clothing and clothing appropriate for easy access to any Portacath or PICC line.   We strive to give you quality time with your provider. You may need to reschedule your appointment if you arrive late (15 or more minutes).  Arriving late affects you and other patients whose appointments are after yours.  Also, if you miss three or more appointments without notifying the office, you may be dismissed from the clinic at the provider's discretion.      For prescription refill requests, have your pharmacy contact our office and allow 72 hours for refills to be completed.    Today you received the following chemotherapy and/or immunotherapy agents : Abraxane / Gemzar   To help prevent nausea and vomiting after your treatment, we encourage you to take your nausea medication as directed.  BELOW ARE SYMPTOMS THAT SHOULD BE REPORTED IMMEDIATELY: *FEVER GREATER THAN 100.4 F (38 C) OR HIGHER *CHILLS OR SWEATING *NAUSEA AND VOMITING THAT IS NOT CONTROLLED WITH YOUR NAUSEA MEDICATION *UNUSUAL SHORTNESS OF BREATH *UNUSUAL BRUISING OR BLEEDING *URINARY PROBLEMS (pain or burning when urinating, or frequent urination) *BOWEL PROBLEMS (unusual diarrhea, constipation, pain near the anus) TENDERNESS IN MOUTH AND THROAT WITH OR WITHOUT PRESENCE OF ULCERS (sore throat, sores in mouth, or a toothache) UNUSUAL RASH, SWELLING OR PAIN  UNUSUAL VAGINAL DISCHARGE OR ITCHING   Items with * indicate a potential emergency and should be followed up as soon as possible or go to the Emergency Department if any problems should occur.  Please show the CHEMOTHERAPY ALERT CARD or IMMUNOTHERAPY ALERT CARD at  check-in to the Emergency Department and triage nurse.  Should you have questions after your visit or need to cancel or reschedule your appointment, please contact [facility]  [phone] and follow the prompts.  Office hours are 8:00 a.m. to 4:30 p.m. Monday - Friday. Please note that voicemails left after 4:00 p.m. may not be returned until the following business day.  We are closed weekends and major holidays. You have access to a nurse at all times for urgent questions. Please call the main number to the clinic [phone] and follow the prompts.  For any non-urgent questions, you may also contact your provider using MyChart. We now offer e-Visits for anyone 52 and older to request care online for non-urgent symptoms. For details visit [email or web address].   Also download the MyChart app! Go to the app store, search "MyChart", open the app, select [facility], and log in with your MyChart username and password.  Due to Covid, a mask is required upon entering the hospital/clinic. If you do not have a mask, one will be given to you upon arrival. For doctor visits, patients may have 1 support person aged 86 or older with them. For treatment visits, patients cannot have anyone with them due to current Covid guidelines and our immunocompromised population.

## 2021-04-14 NOTE — Progress Notes (Signed)
Pt states that he can not recall which vitamin he is not suppose to take when starting chemo. Also, wants to talk about balance issues.

## 2021-04-15 ENCOUNTER — Encounter: Payer: Self-pay | Admitting: Oncology

## 2021-04-15 LAB — CANCER ANTIGEN 19-9: CA 19-9: 15 U/mL (ref 0–35)

## 2021-04-15 NOTE — Telephone Encounter (Signed)
See telephone note 04/06/21.

## 2021-04-15 NOTE — Telephone Encounter (Signed)
See telephone note 10/18

## 2021-04-19 ENCOUNTER — Telehealth: Payer: Self-pay

## 2021-04-19 NOTE — Telephone Encounter (Signed)
Returned patients call.  Patient's heart rate has been up and down since last week.  Prior to taking the morning metoprolol last Friday his heart rate was 105 and did go down to 80's after medication.  Did do his exercise routine yesterday that did not affect his heart rate.  Could this be related to the treatments.  Also wants Dr. Janese Banks to know that he has tried decreasing the Creon dose.  He ate a sandwich yesterday evening and took 1 capsule of the Creon but he woke up in the middle of the night with reflux.

## 2021-04-21 ENCOUNTER — Telehealth: Payer: Self-pay

## 2021-04-21 NOTE — Telephone Encounter (Signed)
Pt called the office c/o noticeable acid reflux: dispise his two acid reflux medications. Sx not going away after a week seems like its getting worse, has not had this problem before surgery; tastes acid reflux after eating but still has a good appetite, burps a lot, but is now tasting it and it makes his throat sore. Recalls it is a GI issue but he will like to know Dr. Elroy Channel recommendation. Informed pt I will route to Dr. Janese Banks and return his call once I hear back.

## 2021-04-21 NOTE — Telephone Encounter (Signed)
Nutrition Follow-up:  Received voicemail from patient requesting call back.    Patient with pancreatic cancer. S/p surgery at [facility], now starting adjuvant chemotherapy.  Spoke with patient via phone.  Patient wanting to know foods that will help with reflux.  Reports increased reflux over the last week or so.  Taking protonix and creon, although concerned with cost of creon.      Medications: reviewed  Labs: reviewed  Anthropometrics:   Weight 178 lb 3.2 oz on 10/26  176 lb 3.2 oz on 10/10 184 lb on 8/16 180 lb on 8/1  NUTRITION DIAGNOSIS: Unintentional weight loss improving   INTERVENTION:  Reviewed strategies to help with GERD and handout from [facility] emailed to patient.   Encouraged small frequent meals although patient prefers not to do that due to having to take more creon and concerned with cost of medication.  Encouraged patient to speak with MD with continued concerns about reflux if diet strategies and current medication not working      MONITORING, EVALUATION, GOAL: weight trends, intake   NEXT VISIT: Tuesday, Nov 22 during infusion  Marieli Rudy B. Zenia Resides, [place], [place] Registered Dietitian [phone] (mobile)

## 2021-04-22 DIAGNOSIS — Z48815 Encounter for surgical aftercare following surgery on the digestive system: Secondary | ICD-10-CM | POA: Diagnosis not present

## 2021-04-22 DIAGNOSIS — M6281 Muscle weakness (generalized): Secondary | ICD-10-CM | POA: Diagnosis not present

## 2021-04-22 DIAGNOSIS — R2689 Other abnormalities of gait and mobility: Secondary | ICD-10-CM | POA: Diagnosis not present

## 2021-04-22 DIAGNOSIS — C259 Malignant neoplasm of pancreas, unspecified: Secondary | ICD-10-CM | POA: Diagnosis not present

## 2021-04-22 DIAGNOSIS — R2681 Unsteadiness on feet: Secondary | ICD-10-CM | POA: Diagnosis not present

## 2021-04-26 ENCOUNTER — Telehealth: Payer: Self-pay | Admitting: *Deleted

## 2021-04-26 DIAGNOSIS — I7121 Aneurysm of the ascending aorta, without rupture: Secondary | ICD-10-CM | POA: Diagnosis not present

## 2021-04-26 DIAGNOSIS — I480 Paroxysmal atrial fibrillation: Secondary | ICD-10-CM | POA: Diagnosis not present

## 2021-04-26 DIAGNOSIS — E785 Hyperlipidemia, unspecified: Secondary | ICD-10-CM | POA: Diagnosis not present

## 2021-04-26 DIAGNOSIS — Z23 Encounter for immunization: Secondary | ICD-10-CM | POA: Diagnosis not present

## 2021-04-26 NOTE — Telephone Encounter (Signed)
Returned patient's call from Friday 04/23/2021. Patient reported that about 3 days after his last infusion his HR increased by approximately 30 beats per minute. The HR returned to normal. He also is experiencing increased episodes of acid reflux. He stated that this was not urgent and he would discuss with Dr. Janese Banks tomorrow prior to treatment.

## 2021-04-27 ENCOUNTER — Inpatient Hospital Stay (HOSPITAL_BASED_OUTPATIENT_CLINIC_OR_DEPARTMENT_OTHER): Payer: PPO | Admitting: Oncology

## 2021-04-27 ENCOUNTER — Inpatient Hospital Stay: Payer: PPO

## 2021-04-27 ENCOUNTER — Inpatient Hospital Stay: Payer: PPO | Attending: Oncology

## 2021-04-27 ENCOUNTER — Encounter: Payer: Self-pay | Admitting: Oncology

## 2021-04-27 ENCOUNTER — Other Ambulatory Visit: Payer: Self-pay

## 2021-04-27 VITALS — BP 116/65 | HR 57 | Temp 97.1°F | Resp 18 | Wt 179.0 lb

## 2021-04-27 DIAGNOSIS — C251 Malignant neoplasm of body of pancreas: Secondary | ICD-10-CM | POA: Insufficient documentation

## 2021-04-27 DIAGNOSIS — T451X5A Adverse effect of antineoplastic and immunosuppressive drugs, initial encounter: Secondary | ICD-10-CM

## 2021-04-27 DIAGNOSIS — Z5111 Encounter for antineoplastic chemotherapy: Secondary | ICD-10-CM

## 2021-04-27 DIAGNOSIS — D701 Agranulocytosis secondary to cancer chemotherapy: Secondary | ICD-10-CM | POA: Insufficient documentation

## 2021-04-27 LAB — COMPREHENSIVE METABOLIC PANEL
ALT: 40 U/L (ref 0–44)
AST: 36 U/L (ref 15–41)
Albumin: 3.9 g/dL (ref 3.5–5.0)
Alkaline Phosphatase: 60 U/L (ref 38–126)
Anion gap: 6 (ref 5–15)
BUN: 22 mg/dL (ref 8–23)
CO2: 30 mmol/L (ref 22–32)
Calcium: 8.8 mg/dL — ABNORMAL LOW (ref 8.9–10.3)
Chloride: 101 mmol/L (ref 98–111)
Creatinine, Ser: 0.8 mg/dL (ref 0.61–1.24)
GFR, Estimated: >60 mL/min (ref >60)
Glucose, Bld: 124 mg/dL — ABNORMAL HIGH (ref 70–99)
Potassium: 3.9 mmol/L (ref 3.5–5.1)
Sodium: 137 mmol/L (ref 135–145)
Total Bilirubin: 0.4 mg/dL (ref 0.3–1.2)
Total Protein: 6.7 g/dL (ref 6.5–8.1)

## 2021-04-27 LAB — CBC WITH DIFFERENTIAL/PLATELET
Abs Immature Granulocytes: 0 10*3/uL (ref 0.00–0.07)
Basophils Absolute: 0 10*3/uL (ref 0.0–0.1)
Basophils Relative: 0 %
Eosinophils Absolute: 0.1 10*3/uL (ref 0.0–0.5)
Eosinophils Relative: 2 %
HCT: 38.7 % — ABNORMAL LOW (ref 39.0–52.0)
Hemoglobin: 13.1 g/dL (ref 13.0–17.0)
Immature Granulocytes: 0 %
Lymphocytes Relative: 39 %
Lymphs Abs: 1.9 10*3/uL (ref 0.7–4.0)
MCH: 30.2 pg (ref 26.0–34.0)
MCHC: 33.9 g/dL (ref 30.0–36.0)
MCV: 89.2 fL (ref 80.0–100.0)
Monocytes Absolute: 0.9 10*3/uL (ref 0.1–1.0)
Monocytes Relative: 20 %
Neutro Abs: 1.9 10*3/uL (ref 1.7–7.7)
Neutrophils Relative %: 39 %
Platelets: 248 10*3/uL (ref 150–400)
RBC: 4.34 MIL/uL (ref 4.22–5.81)
RDW: 14 % (ref 11.5–15.5)
WBC: 4.8 10*3/uL (ref 4.0–10.5)
nRBC: 0 % (ref 0.0–0.2)

## 2021-04-27 MED ORDER — PROCHLORPERAZINE MALEATE 10 MG PO TABS
10.0000 mg | ORAL_TABLET | Freq: Once | ORAL | Status: AC
  Administered 2021-04-27: 10 mg via ORAL
  Filled 2021-04-27: qty 1

## 2021-04-27 MED ORDER — FAMOTIDINE 20 MG IN NS 100 ML IVPB
20.0000 mg | Freq: Once | INTRAVENOUS | Status: AC
  Administered 2021-04-27: 20 mg via INTRAVENOUS
  Filled 2021-04-27: qty 20

## 2021-04-27 MED ORDER — SODIUM CHLORIDE 0.9 % IV SOLN
Freq: Once | INTRAVENOUS | Status: AC
  Filled 2021-04-27: qty 250

## 2021-04-27 MED ORDER — PACLITAXEL PROTEIN-BOUND CHEMO INJECTION 100 MG
80.0000 mg/m2 | Freq: Once | INTRAVENOUS | Status: AC
  Administered 2021-04-27: 175 mg via INTRAVENOUS
  Filled 2021-04-27: qty 35

## 2021-04-27 MED ORDER — SODIUM CHLORIDE 0.9% FLUSH
10.0000 mL | Freq: Once | INTRAVENOUS | Status: AC
  Administered 2021-04-27: 10 mL via INTRAVENOUS
  Filled 2021-04-27: qty 10

## 2021-04-27 MED ORDER — PEGFILGRASTIM 6 MG/0.6ML ~~LOC~~ PSKT
6.0000 mg | PREFILLED_SYRINGE | Freq: Once | SUBCUTANEOUS | Status: AC
  Administered 2021-04-27: 6 mg via SUBCUTANEOUS
  Filled 2021-04-27: qty 0.6

## 2021-04-27 MED ORDER — HEPARIN SOD (PORK) LOCK FLUSH 100 UNIT/ML IV SOLN
500.0000 [IU] | Freq: Once | INTRAVENOUS | Status: AC
  Administered 2021-04-27: 500 [IU] via INTRAVENOUS
  Filled 2021-04-27: qty 5

## 2021-04-27 MED ORDER — SODIUM CHLORIDE 0.9 % IV SOLN
700.0000 mg/m2 | Freq: Once | INTRAVENOUS | Status: AC
  Administered 2021-04-27: 1520 mg via INTRAVENOUS
  Filled 2021-04-27: qty 26.3

## 2021-04-27 NOTE — Progress Notes (Signed)
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  He was having symptoms of Abdominal pain and reflux and therefore underwent a CT abdomen and pelvis with contrast on 09/11/2020 which showed a hypointense ill-defined mass in the pancreatic body up to 25 mm.  No extrapancreatic infiltrative density about the aorta or proximal [name].  No local regional adenopathy or distant metastatic disease.  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  This was followed by an EUS at [facility] which showed a hypoechoic mass measuring 1.8 x 1.6 cm in the pancreatic body.  Endosonographic borders poorly defined.  Upstream pancreatic duct dilatation.  No abnormal appearing lymph nodes.  Endosonographic imaging of the liver showed no lesion.  Biopsy showed adenocarcinoma.   PET scan showed 2.7 x 2 cm ovoid mass in the body of the pancreas with an SUV of 7.1.  18 mm hypermetabolic nodule in the isthmus of the thyroid gland.  No evidence of local regional adenopathy or distant metastatic disease.   Patient was evaluated by Dr. Hyman Hopes at [facility] for consideration of surgery and has been deemed to be upfront resectable surgical candidate.  However plan was to offer him neoadjuvant chemotherapy for 3 months to control micrometastatic disease before proceeding with definitive surgery.  If ultimately patient decides not to proceed with surgery then radiation at  [facility] also remains an option.   Patient received 8 doses of gemcitabine and Abraxane chemotherapy 1 week on 1 week off due to neutropenia and thrombocytopenia.  Plan is to proceed with definitive surgery at this time as patient was also getting recurrent UTIs on chemotherapy.  Depending on his recovery after surgery further adjuvant chemotherapy can be considered   Patient underwent laparoscopy surgery by Dr. Hyman Hopes at [facility].  Final pathology showed 3 cm grade 3 poorly differentiated adenocarcinoma of the pancreas with negative margins.  15 lymph nodes negative for malignancy.  Absent chemotherapy response with extensive residual cancer and no evidence of tumor regression.  Perineural invasion identified lymphovascular invasion not identified.   Plan is to proceed with 4 cycles of adjuvant gemcitabine Abraxane 1 week on 1 week off if tolerated    Interval history-after receiving his first adjuvant chemotherapy patient had heart rate in the 100s for about a day or so.  He reported a low-grade temperature between 99 200 but not higher.  He has been following up with cardiology for his heart rate as well and had a Holter monitor.  He was seen by them yesterday and his metoprolol dose was decreased recently.  Presently denies any heart palpitations.  Reports reflux symptoms and his Protonix dose was recently increased to 40 mg twice daily.  He has also been needing occasional Tums in addition to that to keep his reflux symptoms under control but they have not completely resolved.  ECOG PS- 1 Pain scale- 0   Review of systems- Review of Systems  Constitutional:  Positive for malaise/fatigue. Negative for chills, fever and weight  loss.  HENT:  Negative for congestion, ear discharge and nosebleeds.   Eyes:  Negative for blurred vision.  Respiratory:  Negative for cough, hemoptysis, sputum production, shortness of breath and wheezing.   Cardiovascular:  Negative for chest pain, palpitations, orthopnea  and claudication.  Gastrointestinal:  Positive for heartburn. Negative for abdominal pain, blood in stool, constipation, diarrhea, melena, nausea and vomiting.  Genitourinary:  Negative for dysuria, flank pain, frequency, hematuria and urgency.  Musculoskeletal:  Negative for back pain, joint pain and myalgias.  Skin:  Negative for rash.  Neurological:  Negative for dizziness, tingling, focal weakness, seizures, weakness and headaches.  Endo/Heme/Allergies:  Does not bruise/bleed easily.  Psychiatric/Behavioral:  Negative for depression and suicidal ideas. The patient does not have insomnia.       Allergies  Allergen Reactions   Kenalog [Triamcinolone Acetonide]     Blindness X 3 Days     Past Medical History:  Diagnosis Date   Allergy    Aneurysm of abdominal aorta    Basal cell carcinoma    right temple Dr. 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     Past Surgical History:  Procedure Laterality Date   CATARACT EXTRACTION Right    CATARACT EXTRACTION W/PHACO Left 12/26/2017   Procedure: CATARACT EXTRACTION PHACO AND INTRAOCULAR LENS PLACEMENT ([facility])  COMPLICATED LEFT;  Surgeon: Leandrew Koyanagi, MD;  Location: [facility];  Service: Ophthalmology;  Laterality: Left;  [place]   colonoscopy     ESOPHAGOGASTRODUODENOSCOPY (EGD) WITH PROPOFOL     EYE MUSCLE SURGERY Left    PORTA CATH INSERTION N/A 10/19/2020   Procedure: PORTA CATH INSERTION;  Surgeon: Algernon Huxley, MD;  Location: [facility];  Service: Cardiovascular;  Laterality: N/A;   TONSILLECTOMY AND ADENOIDECTOMY      Social History   Socioeconomic History   Marital status: Married    Spouse name: Eloise   Number of children: 4    Years of education: Not on file   Highest education level: Not on file  Occupational History   Occupation: retired  Tobacco Use   Smoking status: Never   Smokeless tobacco: Never  Vaping Use   Vaping Use: Never used  Substance and Sexual Activity   Alcohol use: No   Drug use: No   Sexual activity: Yes    Partners: Female  Other Topics Concern   Not on file  Social History Narrative   Lives with wife in a 5 story retirement home.  He lives on the first floor.  4 year college degree.  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     Social Determinants of Health   Financial Resource Strain: Not on file  Food Insecurity: Not on file  Transportation Needs: Not on file  Physical Activity: Not on file  Stress: Not on file  Social Connections: Not on file  Intimate Partner Violence: Not on file    Family History  Problem Relation Age of Onset   Stroke Mother    Arthritis Father    Heart disease Father    Brain cancer Brother        1/2 brother (possibly  related to mother NOT related to patient)   Colon cancer Neg Hx    Esophageal cancer Neg Hx    Pancreatic cancer Neg Hx    Stomach cancer Neg Hx    Liver disease Neg Hx      Current Outpatient Medications:    acetaminophen (TYLENOL) 325 MG tablet, Take 650 mg by mouth every 6 (six) hours as needed., Disp: , Rfl:    acetaminophen (TYLENOL) 325 MG tablet, Take by mouth., Disp: , Rfl:    ALPRAZolam (XANAX) 0.25 MG tablet, Take by mouth., Disp: , Rfl:    ALPRAZolam (XANAX) 0.5 MG tablet, Take by mouth. (Patient not taking: Reported on 04/14/2021), Disp: , Rfl:    Calcium Carb-Cholecalciferol (CALCIUM 1000 + D PO), Take 1 tablet by mouth daily. , Disp: , Rfl:    Cholecalciferol 50 MCG (2000 UT) CAPS, Take 1 capsule by mouth daily., Disp: , Rfl:    Cranberry 1000 MG CAPS, Take 2 tablets by mouth daily., Disp: , Rfl:    CREON 36000-114000 units CPEP capsule, Take by mouth. 2 pills with meal, 1 pill with  snack, Disp: , Rfl:    Denosumab (PROLIA [place]), Inject into the skin every 6 (six) months., Disp: , Rfl:    glucosamine-chondroitin 500-400 MG tablet, Take 1 tablet by mouth daily., Disp: , Rfl:    hydrocortisone (ANUSOL-HC) 2.5 % rectal cream, Place 1 application rectally 2 (two) times daily. (Patient not taking: No sig reported), Disp: 30 g, Rfl: 0   lidocaine-prilocaine (EMLA) cream, Apply to affected area once, Disp: 30 g, Rfl: 3   loratadine (CLARITIN) 10 MG tablet, Take 10 mg by mouth daily as needed. , Disp: , Rfl:    metoprolol succinate (TOPROL-XL) 50 MG 24 hr tablet, Take by mouth. (Patient not taking: Reported on 04/14/2021), Disp: , Rfl:    metoprolol succinate (TOPROL-XL) 50 MG 24 hr tablet, Take 1.5 tablets (75 mg total) by mouth daily., Disp: 30 tablet, Rfl: 1   pantoprazole (PROTONIX) 40 MG tablet, TAKE 1 TABLET BY MOUTH EVERY DAY, Disp: 30 tablet, Rfl: 0   phenazopyridine (PYRIDIUM) 95 MG tablet, Take 95 mg by mouth daily. 2a day (Patient not taking: No sig reported), Disp: , Rfl:    Ranibizumab (LUCENTIS IO), Inject into the eye as needed. As needed every three months., Disp: , Rfl:    tamsulosin (FLOMAX) 0.4 MG CAPS capsule, Take 1 capsule (0.4 mg total) by mouth daily after supper., Disp: 30 capsule, Rfl: 0   traZODone (DESYREL) 50 MG tablet, TAKE 0.5 TABLETS (25 MG TOTAL) BY MOUTH AT BEDTIME. MAY INCREASE TO FULL TABLET IF NEEDED, Disp: 90 tablet, Rfl: 0   TWINRIX 720-20 ELU-MCG/ML injection, , Disp: , Rfl:    vitamin B-12 (CYANOCOBALAMIN) 1000 MCG tablet, Take 1,000 mcg by mouth in the morning and at bedtime., Disp: , Rfl:  No current facility-administered medications for this visit.  Facility-Administered Medications Ordered in Other Visits:    heparin lock flush 100 unit/mL, 500 Units, Intravenous, Once, Sindy Guadeloupe, MD  Physical exam:  Vitals:   04/27/21 1012  BP: 116/65  Pulse: (!) 57  Resp: 18  Temp: (!) 97.1 F (36.2 C)  SpO2: 99%  Weight: 179 lb (81.2 kg)    Physical Exam Constitutional:      General: He is not in acute distress. Cardiovascular:     Rate and Rhythm: Normal rate and regular rhythm.     Heart sounds: Normal heart sounds.  Pulmonary:  Effort: Pulmonary effort is normal.     Breath sounds: Normal breath sounds.  Skin:    General: Skin is warm and dry.  Neurological:     Mental Status: He is alert and oriented to person, place, and time.     CMP Latest Ref Rng & Units 04/14/2021  Glucose 70 - 99 mg/dL 167(H)  BUN 8 - 23 mg/dL 19  Creatinine 0.61 - 1.24 mg/dL 0.88  Sodium 135 - 145 mmol/L 137  Potassium 3.5 - 5.1 mmol/L 3.8  Chloride 98 - 111 mmol/L 100  CO2 22 - 32 mmol/L 28  Calcium 8.9 - 10.3 mg/dL 9.0  Total Protein 6.5 - 8.1 g/dL 6.8  Total Bilirubin 0.3 - 1.2 mg/dL 0.7  Alkaline Phos 38 - 126 U/L 53  AST 15 - 41 U/L 36  ALT 0 - 44 U/L 35   CBC Latest Ref Rng & Units 04/27/2021  WBC 4.0 - 10.5 K/uL 4.8  Hemoglobin 13.0 - 17.0 g/dL 13.1  Hematocrit 39.0 - 52.0 % 38.7(L)  Platelets 150 - 400 K/uL 248     Assessment and plan- Patient is a 87 y.o. male stage Ib pancreatic adenocarcinoma s/p 3 months of neoadjuvant gemcitabine Abraxane chemotherapy followed by roboticAssisted laparoscopy and distal pancreatectomy here for on treatment assessment prior to cycle 1 day 15 of gemcitabine and Abraxane chemotherapy  Patient has been receiving chemotherapy 1 week on 1 week off due to chemo induced neutropenia and counts are otherwise okay to proceed with cycle 1 day 15 of gemcitabine Abraxane chemotherapy today with on pro Neulasta support.  I will see him back in 2 weeks for cycle 2-day 1.  Plan is to complete 4 cycles of chemotherapy if tolerated  Heartburn: Continue Protonix 40 mg twice daily and I will add famotidine to his premedications as well today.   Visit Diagnosis 1. Encounter for antineoplastic chemotherapy   2. Chemotherapy induced neutropenia (HCC)   3. Malignant neoplasm of body of pancreas ([place])       Dr. Randa Evens, MD, MPH [facility] [phone] 04/27/2021 10:08 AM

## 2021-04-27 NOTE — Patient Instructions (Signed)
[facility] ONCOLOGY  Discharge Instructions: Thank you for choosing [facility] to provide your oncology and hematology care.  If you have a lab appointment with the [facility], please go directly to the [facility] and check in at the registration area.  Wear comfortable clothing and clothing appropriate for easy access to any Portacath or PICC line.   We strive to give you quality time with your provider. You may need to reschedule your appointment if you arrive late (15 or more minutes).  Arriving late affects you and other patients whose appointments are after yours.  Also, if you miss three or more appointments without notifying the office, you may be dismissed from the clinic at the provider's discretion.      For prescription refill requests, have your pharmacy contact our office and allow 72 hours for refills to be completed.    Today you received the following chemotherapy and/or immunotherapy agents: Gemzar, Abraxane      To help prevent nausea and vomiting after your treatment, we encourage you to take your nausea medication as directed.  BELOW ARE SYMPTOMS THAT SHOULD BE REPORTED IMMEDIATELY: *FEVER GREATER THAN 100.4 F (38 C) OR HIGHER *CHILLS OR SWEATING *NAUSEA AND VOMITING THAT IS NOT CONTROLLED WITH YOUR NAUSEA MEDICATION *UNUSUAL SHORTNESS OF BREATH *UNUSUAL BRUISING OR BLEEDING *URINARY PROBLEMS (pain or burning when urinating, or frequent urination) *BOWEL PROBLEMS (unusual diarrhea, constipation, pain near the anus) TENDERNESS IN MOUTH AND THROAT WITH OR WITHOUT PRESENCE OF ULCERS (sore throat, sores in mouth, or a toothache) UNUSUAL RASH, SWELLING OR PAIN  UNUSUAL VAGINAL DISCHARGE OR ITCHING   Items with * indicate a potential emergency and should be followed up as soon as possible or go to the Emergency Department if any problems should occur.  Please show the CHEMOTHERAPY ALERT CARD or IMMUNOTHERAPY ALERT CARD at  check-in to the Emergency Department and triage nurse.  Should you have questions after your visit or need to cancel or reschedule your appointment, please contact [facility]  [phone] and follow the prompts.  Office hours are 8:00 a.m. to 4:30 p.m. Monday - Friday. Please note that voicemails left after 4:00 p.m. may not be returned until the following business day.  We are closed weekends and major holidays. You have access to a nurse at all times for urgent questions. Please call the main number to the clinic [phone] and follow the prompts.  For any non-urgent questions, you may also contact your provider using MyChart. We now offer e-Visits for anyone 2 and older to request care online for non-urgent symptoms. For details visit [email or web address].   Also download the MyChart app! Go to the app store, search "MyChart", open the app, select [facility], and log in with your MyChart username and password.  Due to Covid, a mask is required upon entering the hospital/clinic. If you do not have a mask, one will be given to you upon arrival. For doctor visits, patients may have 1 support person aged 82 or older with them. For treatment visits, patients cannot have anyone with them due to current Covid guidelines and our immunocompromised population. Nanoparticle Albumin-Bound Paclitaxel injection What is this medication? NANOPARTICLE ALBUMIN-BOUND PACLITAXEL (Na no PAHR ti kuhl  al BYOO muhn-bound  PAK li TAX el) is a chemotherapy drug. It targets fast dividing cells, like cancer cells, and causes these cells to die. This medicine is used to treat advanced breast cancer, lung cancer, and pancreatic  cancer. This medicine may be used for other purposes; ask your health care provider or pharmacist if you have questions. COMMON BRAND NAME(S): Abraxane What should I tell my care team before I take this medication? They need to know if you have any of these  conditions: kidney disease liver disease low blood counts, like low white cell, platelet, or red cell counts lung or breathing disease, like asthma tingling of the fingers or toes, or other nerve disorder an unusual or allergic reaction to paclitaxel, albumin, other chemotherapy, other medicines, foods, dyes, or preservatives pregnant or trying to get pregnant breast-feeding How should I use this medication? This drug is given as an infusion into a vein. It is administered in a hospital or clinic by a specially trained health care professional. Talk to your pediatrician regarding the use of this medicine in children. Special care may be needed. Overdosage: If you think you have taken too much of this medicine contact a poison control center or emergency room at once. NOTE: This medicine is only for you. Do not share this medicine with others. What if I miss a dose? It is important not to miss your dose. Call your doctor or health care professional if you are unable to keep an appointment. What may interact with this medication? This medicine may interact with the following medications: antiviral medicines for hepatitis, HIV or AIDS certain antibiotics like erythromycin and clarithromycin certain medicines for fungal infections like ketoconazole and itraconazole certain medicines for seizures like carbamazepine, phenobarbital, phenytoin gemfibrozil nefazodone rifampin [place]. Baine's wort This list may not describe all possible interactions. Give your health care provider a list of all the medicines, herbs, non-prescription drugs, or dietary supplements you use. Also tell them if you smoke, drink alcohol, or use illegal drugs. Some items may interact with your medicine. What should I watch for while using this medication? Your condition will be monitored carefully while you are receiving this medicine. You will need important blood work done while you are taking this medicine. This medicine  can cause serious allergic reactions. If you experience allergic reactions like skin rash, itching or hives, swelling of the face, lips, or tongue, tell your doctor or health care professional right away. In some cases, you may be given additional medicines to help with side effects. Follow all directions for their use. This drug may make you feel generally unwell. This is not uncommon, as chemotherapy can affect healthy cells as well as cancer cells. Report any side effects. Continue your course of treatment even though you feel ill unless your doctor tells you to stop. Call your doctor or health care professional for advice if you get a fever, chills or sore throat, or other symptoms of a cold or flu. Do not treat yourself. This drug decreases your body's ability to fight infections. Try to avoid being around people who are sick. This medicine may increase your risk to bruise or bleed. Call your doctor or health care professional if you notice any unusual bleeding. Be careful brushing and flossing your teeth or using a toothpick because you may get an infection or bleed more easily. If you have any dental work done, tell your dentist you are receiving this medicine. Avoid taking products that contain aspirin, acetaminophen, ibuprofen, naproxen, or ketoprofen unless instructed by your doctor. These medicines may hide a fever. Do not become pregnant while taking this medicine or for 6 months after stopping it. Women should inform their doctor if they wish to become pregnant  or think they might be pregnant. Men should not father a child while taking this medicine or for 3 months after stopping it. There is a potential for serious side effects to an unborn child. Talk to your health care professional or pharmacist for more information. Do not breast-feed an infant while taking this medicine or for 2 weeks after stopping it. This medicine may interfere with the ability to get pregnant or to father a child. You  should talk to your doctor or health care professional if you are concerned about your fertility. What side effects may I notice from receiving this medication? Side effects that you should report to your doctor or health care professional as soon as possible: allergic reactions like skin rash, itching or hives, swelling of the face, lips, or tongue breathing problems changes in vision fast, irregular heartbeat low blood pressure mouth sores pain, tingling, numbness in the hands or feet signs of decreased platelets or bleeding - bruising, pinpoint red spots on the skin, black, tarry stools, blood in the urine signs of decreased red blood cells - unusually weak or tired, feeling faint or lightheaded, falls signs of infection - fever or chills, cough, sore throat, pain or difficulty passing urine signs and symptoms of liver injury like dark yellow or brown urine; general ill feeling or flu-like symptoms; light-colored stools; loss of appetite; nausea; right upper belly pain; unusually weak or tired; yellowing of the eyes or skin swelling of the ankles, feet, hands unusually slow heartbeat Side effects that usually do not require medical attention (report to your doctor or health care professional if they continue or are bothersome): diarrhea hair loss loss of appetite nausea, vomiting tiredness This list may not describe all possible side effects. Call your doctor for medical advice about side effects. You may report side effects to FDA at 1-800-FDA-1088. Where should I keep my medication? This drug is given in a hospital or clinic and will not be stored at home. NOTE: This sheet is a summary. It may not cover all possible information. If you have questions about this medicine, talk to your doctor, pharmacist, or health care provider.  2022 Elsevier/Gold Standard (2017-02-07 00:00:00) Gemcitabine injection What is this medication? GEMCITABINE (jem SYE ta been) is a chemotherapy drug. This  medicine is used to treat many types of cancer like breast cancer, lung cancer, pancreatic cancer, and ovarian cancer. This medicine may be used for other purposes; ask your health care provider or pharmacist if you have questions. COMMON BRAND NAME(S): Gemzar, Infugem What should I tell my care team before I take this medication? They need to know if you have any of these conditions: blood disorders infection kidney disease liver disease lung or breathing disease, like asthma recent or ongoing radiation therapy an unusual or allergic reaction to gemcitabine, other chemotherapy, other medicines, foods, dyes, or preservatives pregnant or trying to get pregnant breast-feeding How should I use this medication? This drug is given as an infusion into a vein. It is administered in a hospital or clinic by a specially trained health care professional. Talk to your pediatrician regarding the use of this medicine in children. Special care may be needed. Overdosage: If you think you have taken too much of this medicine contact a poison control center or emergency room at once. NOTE: This medicine is only for you. Do not share this medicine with others. What if I miss a dose? It is important not to miss your dose. Call your doctor or health care  professional if you are unable to keep an appointment. What may interact with this medication? medicines to increase blood counts like filgrastim, pegfilgrastim, sargramostim some other chemotherapy drugs like cisplatin vaccines Talk to your doctor or health care professional before taking any of these medicines: acetaminophen aspirin ibuprofen ketoprofen naproxen This list may not describe all possible interactions. Give your health care provider a list of all the medicines, herbs, non-prescription drugs, or dietary supplements you use. Also tell them if you smoke, drink alcohol, or use illegal drugs. Some items may interact with your medicine. What  should I watch for while using this medication? Visit your doctor for checks on your progress. This drug may make you feel generally unwell. This is not uncommon, as chemotherapy can affect healthy cells as well as cancer cells. Report any side effects. Continue your course of treatment even though you feel ill unless your doctor tells you to stop. In some cases, you may be given additional medicines to help with side effects. Follow all directions for their use. Call your doctor or health care professional for advice if you get a fever, chills or sore throat, or other symptoms of a cold or flu. Do not treat yourself. This drug decreases your body's ability to fight infections. Try to avoid being around people who are sick. This medicine may increase your risk to bruise or bleed. Call your doctor or health care professional if you notice any unusual bleeding. Be careful brushing and flossing your teeth or using a toothpick because you may get an infection or bleed more easily. If you have any dental work done, tell your dentist you are receiving this medicine. Avoid taking products that contain aspirin, acetaminophen, ibuprofen, naproxen, or ketoprofen unless instructed by your doctor. These medicines may hide a fever. Do not become pregnant while taking this medicine or for 6 months after stopping it. Women should inform their doctor if they wish to become pregnant or think they might be pregnant. Men should not father a child while taking this medicine and for 3 months after stopping it. There is a potential for serious side effects to an unborn child. Talk to your health care professional or pharmacist for more information. Do not breast-feed an infant while taking this medicine or for at least 1 week after stopping it. Men should inform their doctors if they wish to father a child. This medicine may lower sperm counts. Talk with your doctor or health care professional if you are concerned about your  fertility. What side effects may I notice from receiving this medication? Side effects that you should report to your doctor or health care professional as soon as possible: allergic reactions like skin rash, itching or hives, swelling of the face, lips, or tongue breathing problems pain, redness, or irritation at site where injected signs and symptoms of a dangerous change in heartbeat or heart rhythm like chest pain; dizziness; fast or irregular heartbeat; palpitations; feeling faint or lightheaded, falls; breathing problems signs of decreased platelets or bleeding - bruising, pinpoint red spots on the skin, black, tarry stools, blood in the urine signs of decreased red blood cells - unusually weak or tired, feeling faint or lightheaded, falls signs of infection - fever or chills, cough, sore throat, pain or difficulty passing urine signs and symptoms of kidney injury like trouble passing urine or change in the amount of urine signs and symptoms of liver injury like dark yellow or brown urine; general ill feeling or flu-like symptoms; light-colored stools;  loss of appetite; nausea; right upper belly pain; unusually weak or tired; yellowing of the eyes or skin swelling of ankles, feet, hands Side effects that usually do not require medical attention (report to your doctor or health care professional if they continue or are bothersome): constipation diarrhea hair loss loss of appetite nausea rash vomiting This list may not describe all possible side effects. Call your doctor for medical advice about side effects. You may report side effects to FDA at 1-800-FDA-1088. Where should I keep my medication? This drug is given in a hospital or clinic and will not be stored at home. NOTE: This sheet is a summary. It may not cover all possible information. If you have questions about this medicine, talk to your doctor, pharmacist, or health care provider.  2022 Elsevier/Gold Standard (2017-08-30  00:00:00)

## 2021-04-27 NOTE — Progress Notes (Signed)
Pt will like to inform Dr. Janese Banks of cardiology status, as well as dealing with tachycardia after tx. With last tx even with CREON medication pt has been experiencing more "acid reflux". Pt is curious in regards to his can on the 16th, believes it is not needed.

## 2021-04-28 ENCOUNTER — Telehealth: Payer: Self-pay | Admitting: *Deleted

## 2021-04-28 ENCOUNTER — Other Ambulatory Visit: Payer: Self-pay | Admitting: Oncology

## 2021-04-28 NOTE — Telephone Encounter (Signed)
Pt called  asking about if he has to have ct scan. He saw that his lab and the appt to see md for results was cancelled. I told him I will check with Janese Banks and let him know. Janese Banks says that he does not need it now and we have cancelled it. I left him a message because I got his voicemail when I called him

## 2021-04-30 ENCOUNTER — Telehealth: Payer: Self-pay | Admitting: Gastroenterology

## 2021-05-03 ENCOUNTER — Other Ambulatory Visit: Payer: Self-pay

## 2021-05-03 ENCOUNTER — Telehealth: Payer: Self-pay | Admitting: Internal Medicine

## 2021-05-03 MED ORDER — OMEPRAZOLE 40 MG PO CPDR: 40.0000 mg | DELAYED_RELEASE_CAPSULE | Freq: Every day | ORAL | 3 refills | Status: DC

## 2021-05-03 NOTE — Telephone Encounter (Signed)
Prolia Verification still in process.

## 2021-05-03 NOTE — Progress Notes (Signed)
Sent in prescription for prilosec for patient to try  for symptoms

## 2021-05-05 ENCOUNTER — Other Ambulatory Visit: Payer: Self-pay | Admitting: Internal Medicine

## 2021-05-05 ENCOUNTER — Ambulatory Visit: Payer: PPO

## 2021-05-05 ENCOUNTER — Telehealth: Payer: Self-pay | Admitting: Internal Medicine

## 2021-05-05 ENCOUNTER — Other Ambulatory Visit: Payer: PPO

## 2021-05-05 NOTE — Telephone Encounter (Signed)
Pt called in regards to sleep medication prescribed to him. Pt states he was advised to call back when he's close to running out of the medication so that he and Dr. Derrel Nip could discuss if the medication should be changed and what should be done next. Pt would like to discuss what the best option would be and explain his questions/ concerns.

## 2021-05-05 NOTE — Telephone Encounter (Signed)
LMTCB

## 2021-05-05 NOTE — Telephone Encounter (Signed)
Spoke with pt and he stated that his sleep medication is not helping him. He stated that he is taking two Trazodone and if he wakes up at 2 or 3am he takes a half Alprazolam and it does not help him get back to sleep. I have scheduled pt for a follow up on 05/17/2021 earliest appt available.

## 2021-05-06 MED ORDER — TRAZODONE HCL 100 MG PO TABS: 100.0000 mg | ORAL_TABLET | Freq: Every day | ORAL | 1 refills | Status: DC

## 2021-05-06 NOTE — Telephone Encounter (Signed)
Pt returning phone call ° °

## 2021-05-06 NOTE — Telephone Encounter (Signed)
Rx for trazodone 100 mg qhs sent to pharmacy

## 2021-05-06 NOTE — Telephone Encounter (Signed)
Spoke with pt about increasing the Alprazolam to a whole tablet. Pt gave a verbal understanding. Pt stated he has enough Alprazolam but needs a refill on the Trazodone since he is having to take two daily.

## 2021-05-06 NOTE — Telephone Encounter (Signed)
Pt is aware that the rx has been sent in and is a different dose. He is aware that he will only take one of those at bedtime instead of two.

## 2021-05-07 ENCOUNTER — Ambulatory Visit: Payer: PPO | Admitting: Oncology

## 2021-05-07 ENCOUNTER — Other Ambulatory Visit: Payer: Self-pay | Admitting: Oncology

## 2021-05-07 DIAGNOSIS — H35433 Paving stone degeneration of retina, bilateral: Secondary | ICD-10-CM | POA: Diagnosis not present

## 2021-05-07 DIAGNOSIS — H353223 Exudative age-related macular degeneration, left eye, with inactive scar: Secondary | ICD-10-CM | POA: Diagnosis not present

## 2021-05-07 DIAGNOSIS — H31093 Other chorioretinal scars, bilateral: Secondary | ICD-10-CM | POA: Diagnosis not present

## 2021-05-07 DIAGNOSIS — H353211 Exudative age-related macular degeneration, right eye, with active choroidal neovascularization: Secondary | ICD-10-CM | POA: Diagnosis not present

## 2021-05-10 ENCOUNTER — Other Ambulatory Visit: Payer: Self-pay | Admitting: *Deleted

## 2021-05-10 DIAGNOSIS — C251 Malignant neoplasm of body of pancreas: Secondary | ICD-10-CM

## 2021-05-11 ENCOUNTER — Encounter: Payer: Self-pay | Admitting: Oncology

## 2021-05-11 ENCOUNTER — Inpatient Hospital Stay: Payer: PPO

## 2021-05-11 ENCOUNTER — Inpatient Hospital Stay (HOSPITAL_BASED_OUTPATIENT_CLINIC_OR_DEPARTMENT_OTHER): Payer: PPO | Admitting: Oncology

## 2021-05-11 ENCOUNTER — Other Ambulatory Visit: Payer: Self-pay

## 2021-05-11 VITALS — BP 114/67 | HR 85 | Temp 97.8°F | Resp 16 | Ht 76.0 in | Wt 179.7 lb

## 2021-05-11 DIAGNOSIS — Z5111 Encounter for antineoplastic chemotherapy: Secondary | ICD-10-CM

## 2021-05-11 DIAGNOSIS — C251 Malignant neoplasm of body of pancreas: Secondary | ICD-10-CM

## 2021-05-11 LAB — COMPREHENSIVE METABOLIC PANEL
ALT: 36 U/L (ref 0–44)
AST: 39 U/L (ref 15–41)
Albumin: 3.8 g/dL (ref 3.5–5.0)
Alkaline Phosphatase: 100 U/L (ref 38–126)
Anion gap: 11 (ref 5–15)
BUN: 19 mg/dL (ref 8–23)
CO2: 27 mmol/L (ref 22–32)
Calcium: 8.8 mg/dL — ABNORMAL LOW (ref 8.9–10.3)
Chloride: 100 mmol/L (ref 98–111)
Creatinine, Ser: 0.86 mg/dL (ref 0.61–1.24)
GFR, Estimated: >60 mL/min (ref >60)
Glucose, Bld: 162 mg/dL — ABNORMAL HIGH (ref 70–99)
Potassium: 3.8 mmol/L (ref 3.5–5.1)
Sodium: 138 mmol/L (ref 135–145)
Total Bilirubin: 0.3 mg/dL (ref 0.3–1.2)
Total Protein: 7.3 g/dL (ref 6.5–8.1)

## 2021-05-11 LAB — CBC WITH DIFFERENTIAL/PLATELET
Abs Immature Granulocytes: 0.23 10*3/uL — ABNORMAL HIGH (ref 0.00–0.07)
Basophils Absolute: 0.1 10*3/uL (ref 0.0–0.1)
Basophils Relative: 1 %
Eosinophils Absolute: 0.1 10*3/uL (ref 0.0–0.5)
Eosinophils Relative: 1 %
HCT: 39.2 % (ref 39.0–52.0)
Hemoglobin: 13.4 g/dL (ref 13.0–17.0)
Immature Granulocytes: 2 %
Lymphocytes Relative: 23 %
Lymphs Abs: 2.3 10*3/uL (ref 0.7–4.0)
MCH: 30.9 pg (ref 26.0–34.0)
MCHC: 34.2 g/dL (ref 30.0–36.0)
MCV: 90.5 fL (ref 80.0–100.0)
Monocytes Absolute: 1.7 10*3/uL — ABNORMAL HIGH (ref 0.1–1.0)
Monocytes Relative: 17 %
Neutro Abs: 5.8 10*3/uL (ref 1.7–7.7)
Neutrophils Relative %: 56 %
Platelets: 205 10*3/uL (ref 150–400)
RBC: 4.33 MIL/uL (ref 4.22–5.81)
RDW: 15.7 % — ABNORMAL HIGH (ref 11.5–15.5)
WBC: 10.1 10*3/uL (ref 4.0–10.5)
nRBC: 0.2 % (ref 0.0–0.2)

## 2021-05-11 MED ORDER — SODIUM CHLORIDE 0.9 % IV SOLN
Freq: Once | INTRAVENOUS | Status: AC
  Filled 2021-05-11: qty 250

## 2021-05-11 MED ORDER — HEPARIN SOD (PORK) LOCK FLUSH 100 UNIT/ML IV SOLN
INTRAVENOUS | Status: AC
  Administered 2021-05-11: 500 [IU]
  Filled 2021-05-11: qty 5

## 2021-05-11 MED ORDER — SODIUM CHLORIDE 0.9 % IV SOLN
700.0000 mg/m2 | Freq: Once | INTRAVENOUS | Status: AC
  Administered 2021-05-11: 1520 mg via INTRAVENOUS
  Filled 2021-05-11: qty 26.3

## 2021-05-11 MED ORDER — PACLITAXEL PROTEIN-BOUND CHEMO INJECTION 100 MG
80.0000 mg/m2 | Freq: Once | INTRAVENOUS | Status: AC
  Administered 2021-05-11: 175 mg via INTRAVENOUS
  Filled 2021-05-11: qty 35

## 2021-05-11 MED ORDER — HEPARIN SOD (PORK) LOCK FLUSH 100 UNIT/ML IV SOLN
500.0000 [IU] | Freq: Once | INTRAVENOUS | Status: AC | PRN
  Filled 2021-05-11: qty 5

## 2021-05-11 MED ORDER — PROCHLORPERAZINE MALEATE 10 MG PO TABS
10.0000 mg | ORAL_TABLET | Freq: Once | ORAL | Status: AC
  Administered 2021-05-11: 10 mg via ORAL
  Filled 2021-05-11: qty 1

## 2021-05-11 NOTE — Patient Instructions (Signed)
[facility] ONCOLOGY  Discharge Instructions: Thank you for choosing [facility] to provide your oncology and hematology care.  If you have a lab appointment with the [facility], please go directly to the [facility] and check in at the registration area.  Wear comfortable clothing and clothing appropriate for easy access to any Portacath or PICC line.   We strive to give you quality time with your provider. You may need to reschedule your appointment if you arrive late (15 or more minutes).  Arriving late affects you and other patients whose appointments are after yours.  Also, if you miss three or more appointments without notifying the office, you may be dismissed from the clinic at the provider's discretion.      For prescription refill requests, have your pharmacy contact our office and allow 72 hours for refills to be completed.    Today you received the following chemotherapy and/or immunotherapy agents Abraxane and Gemcitabine      To help prevent nausea and vomiting after your treatment, we encourage you to take your nausea medication as directed.  BELOW ARE SYMPTOMS THAT SHOULD BE REPORTED IMMEDIATELY: *FEVER GREATER THAN 100.4 F (38 C) OR HIGHER *CHILLS OR SWEATING *NAUSEA AND VOMITING THAT IS NOT CONTROLLED WITH YOUR NAUSEA MEDICATION *UNUSUAL SHORTNESS OF BREATH *UNUSUAL BRUISING OR BLEEDING *URINARY PROBLEMS (pain or burning when urinating, or frequent urination) *BOWEL PROBLEMS (unusual diarrhea, constipation, pain near the anus) TENDERNESS IN MOUTH AND THROAT WITH OR WITHOUT PRESENCE OF ULCERS (sore throat, sores in mouth, or a toothache) UNUSUAL RASH, SWELLING OR PAIN  UNUSUAL VAGINAL DISCHARGE OR ITCHING   Items with * indicate a potential emergency and should be followed up as soon as possible or go to the Emergency Department if any problems should occur.  Please show the CHEMOTHERAPY ALERT CARD or IMMUNOTHERAPY ALERT  CARD at check-in to the Emergency Department and triage nurse.  Should you have questions after your visit or need to cancel or reschedule your appointment, please contact [facility]  [phone] and follow the prompts.  Office hours are 8:00 a.m. to 4:30 p.m. Monday - Friday. Please note that voicemails left after 4:00 p.m. may not be returned until the following business day.  We are closed weekends and major holidays. You have access to a nurse at all times for urgent questions. Please call the main number to the clinic [phone] and follow the prompts.  For any non-urgent questions, you may also contact your provider using MyChart. We now offer e-Visits for anyone 42 and older to request care online for non-urgent symptoms. For details visit [email or web address].   Also download the MyChart app! Go to the app store, search "MyChart", open the app, select [employer], and log in with your MyChart username and password.  Due to Covid, a mask is required upon entering the hospital/clinic. If you do not have a mask, one will be given to you upon arrival. For doctor visits, patients may have 1 support person aged 49 or older with them. For treatment visits, patients cannot have anyone with them due to current Covid guidelines and our immunocompromised population.

## 2021-05-11 NOTE — Progress Notes (Signed)
Takes a boost a day, says that sometimes he has bowel movement each time he goes to bathroom and sometimes he has bloating and has horrible smelling BM.that is a change from his usual

## 2021-05-11 NOTE — Progress Notes (Signed)
Nutrition Follow-up:  Patient with pancreatic cancer.  S/p surgery at [facility] (distal pancreatectomy).  Patient receiving gemcitabine/abraxane.  Met with patient during infusion.  Patient reports that he is having gas (burping and flatus) and bloating.  Trying to eat less fatty foods but wants to keep weight up.  Reports that reflux is better with adding prilosec.  Drinking boost VHC 1 time per day.      Medications: creon  Labs: reviewed  Anthropometrics:   Weight 179 lb 11.2 oz  178 lb 3.2 oz on 10/26 176 lb 3.2 oz on 10/10 184 lb on 8/16 180 lb on 8/1  NUTRITION DIAGNOSIS: Unintentional weight loss improving   INTERVENTION:  Patient to try eating half meal prepared for him at [facility] and then other half later. Continue creon. May need to increase dose with symptoms.  Patient concerned about cost of creon.   If able increase Boost VHC to BID    MONITORING, EVALUATION, GOAL: weight trends, intake   NEXT VISIT: ~ 4 weeks with treatment  Melondy Blanchard B. Zenia Resides, [place], [place] Registered Dietitian [phone] (mobile)

## 2021-05-11 NOTE — Progress Notes (Signed)
Hematology/Oncology Consult note [facility]  [phone] [phone]  Patient Care Team: Crecencio Mc, MD as PCP - General (Internal Medicine) Clent Jacks, RN as Oncology Nurse Navigator   Name of the patient: George Owens  [id number]  Apr 24, 1932   Date of visit: 05/11/21  Diagnosis- pancreatic adenocarcinoma stage I BC T2N 0M0  Chief complaint/ Reason for visit-on treatment assessment prior to cycle 2-day 1 of adjuvant gemcitabine Abraxane chemotherapy  Heme/Onc history:  patient is a 87 year old male with a past medical history significant for GERD, hyperlipidemia, cirrhosis.  He was having symptoms of Abdominal pain and reflux and therefore underwent a CT abdomen and pelvis with contrast on 09/11/2020 which showed a hypointense ill-defined mass in the pancreatic body up to 25 mm.  No extrapancreatic infiltrative density about the aorta or proximal [name].  No local regional adenopathy or distant metastatic disease.  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  This was followed by an EUS at [facility] which showed a hypoechoic mass measuring 1.8 x 1.6 cm in the pancreatic body.  Endosonographic borders poorly defined.  Upstream pancreatic duct dilatation.  No abnormal appearing lymph nodes.  Endosonographic imaging of the liver showed no lesion.  Biopsy showed adenocarcinoma.   PET scan showed 2.7 x 2 cm ovoid mass in the body of the pancreas with an SUV of 7.1.  18 mm hypermetabolic nodule in the isthmus of the thyroid gland.  No evidence of local regional adenopathy or distant metastatic disease.   Patient was evaluated by Dr. Hyman Hopes at [facility] for consideration of surgery and has been deemed to be upfront resectable surgical candidate.  However plan was to offer him neoadjuvant chemotherapy for 3 months to control micrometastatic disease before proceeding with definitive surgery.  If ultimately patient decides not to proceed with surgery then  radiation at [facility] also remains an option.   Patient received 8 doses of gemcitabine and Abraxane chemotherapy 1 week on 1 week off due to neutropenia and thrombocytopenia.  Plan is to proceed with definitive surgery at this time as patient was also getting recurrent UTIs on chemotherapy.  Depending on his recovery after surgery further adjuvant chemotherapy can be considered   Patient underwent laparoscopy surgery by Dr. Hyman Hopes at [facility].  Final pathology showed 3 cm grade 3 poorly differentiated adenocarcinoma of the pancreas with negative margins.  15 lymph nodes negative for malignancy.  Absent chemotherapy response with extensive residual cancer and no evidence of tumor regression.  Perineural invasion identified lymphovascular invasion not identified.   Plan is to proceed with 4 cycles of adjuvant gemcitabine Abraxane 1 week on 1 week off if tolerated    Interval history-patient tolerated prior chemotherapy cycle well with minimal side effects.  Typically his heart rate does go up into the 100s for a day or 2 after chemotherapy.  He does report occasional nausea but he does not use any nausea medications.  Appetite has been good and weight has been stable.  He continues to have problems with indigestion for which she is on omeprazole and sees Dr. Marius Ditch  ECOG PS- 1 Pain scale- 0   Review of systems- Review of Systems  Constitutional:  Negative for chills, fever, malaise/fatigue and weight loss.  HENT:  Negative for congestion, ear discharge and nosebleeds.   Eyes:  Negative for blurred vision.  Respiratory:  Negative for cough, hemoptysis, sputum production, shortness of breath and wheezing.   Cardiovascular:  Negative for chest pain, palpitations, orthopnea  and claudication.  Gastrointestinal:  Positive for nausea. Negative for abdominal pain, blood in stool, constipation, diarrhea, heartburn, melena and vomiting.  Genitourinary:  Negative for dysuria, flank pain, frequency, hematuria and  urgency.  Musculoskeletal:  Negative for back pain, joint pain and myalgias.  Skin:  Negative for rash.  Neurological:  Negative for dizziness, tingling, focal weakness, seizures, weakness and headaches.  Endo/Heme/Allergies:  Does not bruise/bleed easily.  Psychiatric/Behavioral:  Negative for depression and suicidal ideas. The patient does not have insomnia.       Allergies  Allergen Reactions   Kenalog [Triamcinolone Acetonide]     Blindness X 3 Days injection     Past Medical History:  Diagnosis Date   Allergy    Aneurysm of abdominal aorta    Basal cell carcinoma    right temple Dr. Evorn Gong 12/2018   Chicken pox    Cirrhosis of liver ([place]) 05/12/2020   Colon polyps    COVID-19 virus infection 03/16/2020   GERD (gastroesophageal reflux disease)    history of prostate CA 2006   Hyperlipidemia    Leg cramps    Neuropathy    Osteoporosis    Pancreas cancer ([place])    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     Past Surgical History:  Procedure Laterality Date   CATARACT EXTRACTION Right    CATARACT EXTRACTION W/PHACO Left 12/26/2017   Procedure: CATARACT EXTRACTION PHACO AND INTRAOCULAR LENS PLACEMENT ([place])  COMPLICATED LEFT;  Surgeon: Leandrew Koyanagi, MD;  Location: [facility];  Service: Ophthalmology;  Laterality: Left;  [place]   colonoscopy     ESOPHAGOGASTRODUODENOSCOPY (EGD) WITH PROPOFOL     EYE MUSCLE SURGERY Left    PORTA CATH INSERTION N/A 10/19/2020   Procedure: PORTA CATH INSERTION;  Surgeon: Algernon Huxley, MD;  Location: [place];  Service: Cardiovascular;  Laterality: N/A;   TONSILLECTOMY AND ADENOIDECTOMY      Social History   Socioeconomic History   Marital status: Married    Spouse name: Eloise   Number of children: 4   Years of education: Not on file   Highest education level: Not on file  Occupational History   Occupation: retired  Tobacco Use   Smoking status: Never   Smokeless tobacco: Never  Vaping Use    Vaping Use: Never used  Substance and Sexual Activity   Alcohol use: No   Drug use: No   Sexual activity: Yes    Partners: Female  Other Topics Concern   Not on file  Social History Narrative   Lives with wife in a 5 story retirement home.  He lives on the first floor.  4 year college degree.  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     Social Determinants of Health   Financial Resource Strain: Not on file  Food Insecurity: Not on file  Transportation Needs: Not on file  Physical Activity: Not on file  Stress: Not on file  Social Connections: Not on file  Intimate Partner Violence: Not on file    Family History  Problem Relation Age of Onset   Stroke Mother    Arthritis Father    Heart disease Father    Brain cancer Brother        1/2 brother (possibly related to mother NOT related to patient)   Colon cancer Neg Hx    Esophageal cancer Neg Hx    Pancreatic cancer Neg Hx    Stomach cancer Neg Hx  Liver disease Neg Hx      Current Outpatient Medications:    acetaminophen (TYLENOL) 325 MG tablet, Take 650 mg by mouth every 6 (six) hours as needed., Disp: , Rfl:    ALPRAZolam (XANAX) 0.25 MG tablet, 0.25 mg at bedtime as needed., Disp: , Rfl:    Calcium Carb-Cholecalciferol (CALCIUM 1000 + D PO), Take 1 tablet by mouth daily. , Disp: , Rfl:    Cholecalciferol 50 MCG (2000 UT) CAPS, Take 1 capsule by mouth daily., Disp: , Rfl:    Cranberry 1000 MG CAPS, Take 2 tablets by mouth daily., Disp: , Rfl:    CREON 36000-114000 units CPEP capsule, Take by mouth. 2 pills with meal, 1 pill with snack, Disp: , Rfl:    glucosamine-chondroitin 500-400 MG tablet, Take 1 tablet by mouth daily., Disp: , Rfl:    hydrocortisone (ANUSOL-HC) 2.5 % rectal cream, Place 1 application rectally 2 (two) times daily., Disp: 30 g, Rfl: 0   lidocaine-prilocaine (EMLA) cream, Apply to affected area once, Disp: 30 g, Rfl: 3   loratadine (CLARITIN) 10 MG tablet, Take 10 mg by  mouth daily as needed. , Disp: , Rfl:    metoprolol succinate (TOPROL-XL) 50 MG 24 hr tablet, Take 1.5 tablets (75 mg total) by mouth daily., Disp: 30 tablet, Rfl: 1   omeprazole (PRILOSEC) 40 MG capsule, Take 1 capsule (40 mg total) by mouth daily., Disp: 90 capsule, Rfl: 3   phenazopyridine (PYRIDIUM) 95 MG tablet, Take 95 mg by mouth daily. 2a day, Disp: , Rfl:    Ranibizumab (LUCENTIS IO), Inject into the eye as needed. As needed every three months., Disp: , Rfl:    tamsulosin (FLOMAX) 0.4 MG CAPS capsule, TAKE 1 CAPSULE BY MOUTH EVERY DAY AFTER SUPPER, Disp: 30 capsule, Rfl: 0   traZODone (DESYREL) 100 MG tablet, Take 1 tablet (100 mg total) by mouth at bedtime. May increase to full tablet If needed, Disp: 90 tablet, Rfl: 1   vitamin B-12 (CYANOCOBALAMIN) 1000 MCG tablet, Take 1,000 mcg by mouth in the morning and at bedtime., Disp: , Rfl:    Denosumab (PROLIA [place]), Inject into the skin every 6 (six) months. (Patient not taking: Reported on 04/27/2021), Disp: , Rfl:   Physical exam:  Vitals:   05/11/21 0922  BP: 114/67  Pulse: 85  Resp: 16  Temp: 97.8 F (36.6 C)  TempSrc: Oral  Weight: 179 lb 11.2 oz (81.5 kg)  Height: 6\' 4"  (1.93 m)   Physical Exam Cardiovascular:     Rate and Rhythm: Normal rate and regular rhythm.     Heart sounds: Normal heart sounds.  Pulmonary:     Effort: Pulmonary effort is normal.     Breath sounds: Normal breath sounds.  Skin:    General: Skin is warm and dry.  Neurological:     Mental Status: He is alert and oriented to person, place, and time.     CMP Latest Ref Rng & Units 05/11/2021  Glucose 70 - 99 mg/dL 162(H)  BUN 8 - 23 mg/dL 19  Creatinine 0.61 - 1.24 mg/dL 0.86  Sodium 135 - 145 mmol/L 138  Potassium 3.5 - 5.1 mmol/L 3.8  Chloride 98 - 111 mmol/L 100  CO2 22 - 32 mmol/L 27  Calcium 8.9 - 10.3 mg/dL 8.8(L)  Total Protein 6.5 - 8.1 g/dL 7.3  Total Bilirubin 0.3 - 1.2 mg/dL 0.3  Alkaline Phos 38 - 126 U/L 100  AST 15 - 41 U/L 39   ALT 0 -  44 U/L 36   CBC Latest Ref Rng & Units 05/11/2021  WBC 4.0 - 10.5 K/uL 10.1  Hemoglobin 13.0 - 17.0 g/dL 13.4  Hematocrit 39.0 - 52.0 % 39.2  Platelets 150 - 400 K/uL 205     Assessment and plan- Patient is a 87 y.o. male  stage Ib pancreatic adenocarcinoma s/p 3 months of neoadjuvant gemcitabine Abraxane chemotherapy followed by roboticAssisted laparoscopy and distal pancreatectomy.  He is here for on treatment assessment prior to cycle 2-day 1 of gemcitabine Abraxane chemotherapy  Counts okay to proceed with cycle 2-day 1 of gemcitabine Abraxane chemotherapy today.  He does not require growth factor support today return to clinic in 2 weeks for cycle 2-day 15.  Plan to repeat scans after 3 cycles.  Patient seems to be tolerating this regimen well 1 week on 1 week off with stable counts and minimal side effects.   Visit Diagnosis 1. Encounter for antineoplastic chemotherapy   2. Malignant neoplasm of body of pancreas ([place])      Dr. Randa Evens, MD, MPH [facility] [phone] 05/11/2021 12:01 PM

## 2021-05-12 ENCOUNTER — Telehealth: Payer: Self-pay

## 2021-05-12 ENCOUNTER — Telehealth: Payer: Self-pay | Admitting: Gastroenterology

## 2021-05-12 LAB — CANCER ANTIGEN 19-9: CA 19-9: 18 U/mL (ref 0–35)

## 2021-05-12 NOTE — Telephone Encounter (Signed)
Inbound call from pt requesting a call back stating that was approved for a 90 day supply for Creon. He stated that it will probably be a month before getting that medication and wanted to know if you had samples until then. Please advise. Thank you.

## 2021-05-12 NOTE — Telephone Encounter (Signed)
CALLED PATIENT BACK PUT HIM A COUPLE SAMPLES AT FRONT DESK TILL HE GETS HIS MEDICINE

## 2021-05-17 ENCOUNTER — Other Ambulatory Visit: Payer: Self-pay

## 2021-05-17 ENCOUNTER — Encounter: Payer: Self-pay | Admitting: Internal Medicine

## 2021-05-17 ENCOUNTER — Ambulatory Visit (INDEPENDENT_AMBULATORY_CARE_PROVIDER_SITE_OTHER): Payer: PPO | Admitting: Internal Medicine

## 2021-05-17 DIAGNOSIS — K8689 Other specified diseases of pancreas: Secondary | ICD-10-CM

## 2021-05-17 DIAGNOSIS — C251 Malignant neoplasm of body of pancreas: Secondary | ICD-10-CM

## 2021-05-17 DIAGNOSIS — G629 Polyneuropathy, unspecified: Secondary | ICD-10-CM

## 2021-05-17 DIAGNOSIS — Z8546 Personal history of malignant neoplasm of prostate: Secondary | ICD-10-CM

## 2021-05-17 DIAGNOSIS — K219 Gastro-esophageal reflux disease without esophagitis: Secondary | ICD-10-CM | POA: Diagnosis not present

## 2021-05-17 DIAGNOSIS — M818 Other osteoporosis without current pathological fracture: Secondary | ICD-10-CM

## 2021-05-17 DIAGNOSIS — I480 Paroxysmal atrial fibrillation: Secondary | ICD-10-CM | POA: Diagnosis not present

## 2021-05-17 DIAGNOSIS — F5105 Insomnia due to other mental disorder: Secondary | ICD-10-CM | POA: Diagnosis not present

## 2021-05-17 DIAGNOSIS — F409 Phobic anxiety disorder, unspecified: Secondary | ICD-10-CM

## 2021-05-17 NOTE — Progress Notes (Signed)
Subjective:  Patient ID: George Owens, male    DOB: [birth year]  Age: 87 y.o. MRN: [id number]  CC: Diagnoses of Neuropathy, History of prostate cancer, Paroxysmal A-fib ([place]), Malignant neoplasm of body of pancreas ([place]), Other osteoporosis without current pathological fracture, Gastroesophageal reflux disease without esophagitis, Insomnia due to anxiety and fear, and Secondary pancreatic insufficiency were pertinent to this visit.  HPI George Owens presents for  Chief Complaint  Patient presents with   Follow-up    Follow up on sleep medication   This visit occurred during the SARS-CoV-2 public health emergency.  Safety protocols were in place, including screening questions prior to the visit, additional usage of staff PPE, and extensive cleaning of exam room while observing appropriate contact time as indicated for disinfecting solutions.   1) Pancreatic CA:  George Owens was diagnosed several months ago after multiple workups for weight loss finally revealed a pancreatic lesion.  He survived the surgical resection which was done at [facility] and is now receiving biweekly modified chemotherapy, managed by Dr Janese Banks,  typically has 1.5 days  of malaise after each infusion,  last one Nov 22.  He is still walking daily for exercise , though no longer walking 3 miles daily.  Walked 1 mile today.  Starting to lose some hair.  Weight is stable and appetite is improving.    2) Pancreatic insufficiency:  seeing GI:  Dr Marius Ditch:  using Creon but the cost is $3 /pill,  prescribed 6 daily  using max 5 daily .  Gets reflux with some meds.  Not having diarrhea,  But BMS are variable in size, color and frequency.   3)  insomnia:  using 100 mg trazodone at bedtime but still waking up after 5 hours , cannot return to sleep without use of  0.25 mg  xanax .  Feels that his insomnia is  aggravated  by some ongoing mild marital conflict. He loves his wife George Owens dearly but is  aggravated by her lack of sexual interest,   sedentary habits, and her fear of being controlled .   4) Osteoporosis : worried about the potential (not realized) side effects of Prolia; his next dose is  due in the spring.  Discussed today  5) GERD :  protonix did not help ,  wants to resume omeprazole   6 ) Neuropathy:   he has developed a  mild pain involving the medial aspect of his left heel.  He has a history of b12 deficiency and is taking 5000 mcg daily  .  Reminded him that many  chemotherapeutic agents that treat CA cause neuropathy   7) H/o  PAFoccurred during general anesthesia surgery at [facility]:  .  Completed 22 days of lovenox, Developed rectal bleeding  anticoag stopped and.  Holter monitor noted mostly sinus rhythm.  Seeing Paraschos every 3 months metopt\rolol dose reduced to 50 mg     Outpatient Medications Prior to Visit  Medication Sig Dispense Refill   acetaminophen (TYLENOL) 325 MG tablet Take 650 mg by mouth every 6 (six) hours as needed.     ALPRAZolam (XANAX) 0.25 MG tablet 0.25 mg at bedtime as needed.     Calcium Carb-Cholecalciferol (CALCIUM 1000 + D PO) Take 1 tablet by mouth daily.      Cholecalciferol 50 MCG (2000 UT) CAPS Take 1 capsule by mouth daily.     Cranberry 1000 MG CAPS Take 2 tablets by mouth daily.     CREON 36000-114000 units CPEP capsule  Take by mouth. 2 pills with meal, 1 pill with snack     Denosumab (PROLIA [place]) Inject into the skin every 6 (six) months.     glucosamine-chondroitin 500-400 MG tablet Take 1 tablet by mouth daily.     hydrocortisone (ANUSOL-HC) 2.5 % rectal cream Place 1 application rectally 2 (two) times daily. 30 g 0   lidocaine-prilocaine (EMLA) cream Apply to affected area once 30 g 3   loratadine (CLARITIN) 10 MG tablet Take 10 mg by mouth daily as needed.      metoprolol succinate (TOPROL-XL) 50 MG 24 hr tablet Take 1.5 tablets (75 mg total) by mouth daily. (Patient taking differently: Take 50 mg by mouth daily.) 30 tablet 1   omeprazole (PRILOSEC) 40 MG capsule Take 1  capsule (40 mg total) by mouth daily. 90 capsule 3   phenazopyridine (PYRIDIUM) 95 MG tablet Take 95 mg by mouth daily. 2a day     Ranibizumab (LUCENTIS IO) Inject into the eye as needed. As needed every three months.     tamsulosin (FLOMAX) 0.4 MG CAPS capsule TAKE 1 CAPSULE BY MOUTH EVERY DAY AFTER SUPPER 30 capsule 0   traZODone (DESYREL) 100 MG tablet Take 1 tablet (100 mg total) by mouth at bedtime. May increase to full tablet If needed 90 tablet 1   vitamin B-12 (CYANOCOBALAMIN) 1000 MCG tablet Take 1,000 mcg by mouth in the morning and at bedtime.     No facility-administered medications prior to visit.    Review of Systems;  Patient denies headache, fevers, malaise, unintentional weight loss, skin rash, eye pain, sinus congestion and sinus pain, sore throat, dysphagia,  hemoptysis , cough, dyspnea, wheezing, chest pain, palpitations, orthopnea, edema, abdominal pain, nausea, melena, diarrhea, constipation, flank pain, dysuria, hematuria, urinary  Frequency, nocturia, numbness, tingling, seizures,  Focal weakness, Loss of consciousness,  Tremor, depression, anxiety, and suicidal ideation.      Objective:  BP 122/60 (BP Location: Left Arm, Patient Position: Sitting, Cuff Size: Normal)   Pulse 77   Temp (!) 96.7 F (35.9 C) (Temporal)   Ht 6\' 4"  (1.93 m)   Wt 181 lb (82.1 kg)   SpO2 95%   BMI 22.03 kg/m   BP Readings from Last 3 Encounters:  05/17/21 122/60  05/11/21 114/67  04/27/21 116/65    Wt Readings from Last 3 Encounters:  05/17/21 181 lb (82.1 kg)  05/11/21 179 lb 11.2 oz (81.5 kg)  04/27/21 179 lb (81.2 kg)    General appearance: alert, cooperative and appears stated age Ears: normal TM's and external ear canals both ears Throat: lips, mucosa, and tongue normal; teeth and gums normal Neck: no adenopathy, no carotid bruit, supple, symmetrical, trachea midline and thyroid not enlarged, symmetric, no tenderness/mass/nodules Back: symmetric, no curvature. ROM  normal. No CVA tenderness. Lungs: clear to auscultation bilaterally Heart: regular rate and rhythm, S1, S2 normal, no murmur, click, rub or gallop Abdomen: soft, non-tender; bowel sounds normal; no masses,  no organomegaly Pulses: 2+ and symmetric Skin: Skin color, texture, turgor normal. No rashes or lesions Lymph nodes: Cervical, supraclavicular, and axillary nodes normal.  No results found for: HGBA1C  Lab Results  Component Value Date   CREATININE 0.86 05/11/2021   CREATININE 0.80 04/27/2021   CREATININE 0.88 04/14/2021    Lab Results  Component Value Date   WBC 10.1 05/11/2021   HGB 13.4 05/11/2021   HCT 39.2 05/11/2021   PLT 205 05/11/2021   GLUCOSE 162 (H) 05/11/2021   CHOL 198 04/19/2019  TRIG 171 (H) 04/19/2019   HDL 41 04/19/2019   LDLDIRECT 129.0 03/31/2017   LDLCALC 128 (H) 04/19/2019   ALT 36 05/11/2021   AST 39 05/11/2021   NA 138 05/11/2021   K 3.8 05/11/2021   CL 100 05/11/2021   CREATININE 0.86 05/11/2021   BUN 19 05/11/2021   CO2 27 05/11/2021   TSH 2.26 04/27/2020   PSA 0.51 04/27/2020   INR 1.1 12/26/2020    No results found.  Assessment & Plan:   Problem List Items Addressed This Visit     History of prostate cancer   Osteoporosis    Reassurance provided regarding potential side effects of Prolia. Still the best choice for him.  Next dose due Spring 2023/       Neuropathy    b12 was low in 2018 and he has been taking 2 otc capsules daily   . Current symptoms are new and likely related to chemotherapy.  He will ask Dr Janese Banks to check a B12 at his next blood draw      GERD (gastroesophageal reflux disease)    Continue daily use of omeprazole,  Add famotidine qac       Malignant neoplasm of body of pancreas [facility])    Currently undergoing modified chemotherapy managed by Dr Janese Banks.  Weight is stable and CBC normal.        Paroxysmal A-fib ([place])    now taking metoprolol prescribed by Paraschos, now taking reduced dose of 50 mg .   Anticoagulation deferred due to hemorrhoidal bleeding.  Zio monitor noted mostly sinus rhythm.  Follow up with AP in Feb 2023      Insomnia due to anxiety and fear    He prefers to continue trazodone 100 mg qhs and 0.25 mg alprazolam prn early waking .  Alternative therapy with temazepam considered if current therapy fails       Secondary pancreatic insufficiency    Secondary to pancreatic resection for CA.  Using Creon to prevent malabsorption.  Stools are solid and weight is stable        I provided  40 minutes of  face-to-face time during this encounter reviewing patient's current problems and past surgeries, labs and imaging studies, providing counseling on the above mentioned problems , and coordination  of care .  There are no discontinued medications.  Follow-up: No follow-ups on file.   Crecencio Mc, MD

## 2021-05-17 NOTE — Assessment & Plan Note (Addendum)
b12 was low in 2018 and he has been taking 2 otc capsules daily   . Current symptoms are new and likely related to chemotherapy.  He will ask Dr Janese Banks to check a B12 at his next blood draw

## 2021-05-17 NOTE — Patient Instructions (Addendum)
Try taking pepcid (famotidine 20 mg ) before the meal most likely to cause reflux   continue omeprazole in the morning    Continue B12 2 capsules daily .  Dr Janese Banks can check level    For the insomnia :   Continue trazodone 100 mg and 0.25 mg alprazolam for  early wakeups .  If you get tired of this regimen or if it stops working,  we can  try using temazepam

## 2021-05-18 DIAGNOSIS — K8689 Other specified diseases of pancreas: Secondary | ICD-10-CM | POA: Insufficient documentation

## 2021-05-18 NOTE — Assessment & Plan Note (Signed)
Currently undergoing modified chemotherapy managed by Dr Rao.  Weight is stable and CBC normal.   

## 2021-05-18 NOTE — Assessment & Plan Note (Signed)
Reassurance provided regarding potential side effects of Prolia. Still the best choice for him.  Next dose due Spring 2023/

## 2021-05-18 NOTE — Assessment & Plan Note (Signed)
now taking metoprolol prescribed by Paraschos, now taking reduced dose of 50 mg .  Anticoagulation deferred due to hemorrhoidal bleeding.  Zio monitor noted mostly sinus rhythm.  Follow up with AP in Feb 2023

## 2021-05-18 NOTE — Assessment & Plan Note (Signed)
He prefers to continue trazodone 100 mg qhs and 0.25 mg alprazolam prn early waking .  Alternative therapy with temazepam considered if current therapy fails

## 2021-05-18 NOTE — Assessment & Plan Note (Signed)
Secondary to pancreatic resection for CA.  Using Creon to prevent malabsorption.  Stools are solid and weight is stable

## 2021-05-18 NOTE — Assessment & Plan Note (Signed)
Continue daily use of omeprazole,  Add famotidine qac

## 2021-05-21 ENCOUNTER — Other Ambulatory Visit: Payer: Self-pay | Admitting: Oncology

## 2021-05-21 ENCOUNTER — Telehealth: Payer: Self-pay | Admitting: Internal Medicine

## 2021-05-21 MED ORDER — TEMAZEPAM 22.5 MG PO CAPS: 22.5000 mg | ORAL_CAPSULE | Freq: Every evening | ORAL | 0 refills | Status: DC | PRN

## 2021-05-21 NOTE — Telephone Encounter (Signed)
Spoke with pt and he stated that he would like for the temazepam to be sent in to help with his sleep, he stated that this was discussed during his last office visit. Pt also stated that last night he took his first pepcid before dinner and it worked great for the acid reflux but during the night he had to get up more often than usual to go to urinate. Pt stated that one time there was some burning with urination. The symptoms only lasted during the night a coupld hours after taking the pepcid. Pt wanted to know if you have ever heard of this being a side effect. He stated if not he is going to keep taking it and see if it happens again.

## 2021-05-21 NOTE — Telephone Encounter (Signed)
Pt called in regards to OV he had on 11/28 with PCP. Pt was advised he could be prescribed a sleep medication. Pt would like to start this medication. Pt uses [facility] on [address]  Pt also has a question about another medication he was prescribed. He is unsure of the name but he has a few questions about it.

## 2021-05-21 NOTE — Telephone Encounter (Signed)
Spoke with pt and informed him of the message below. Pt gave a verbal understanding.

## 2021-05-25 ENCOUNTER — Other Ambulatory Visit: Payer: Self-pay

## 2021-05-25 ENCOUNTER — Inpatient Hospital Stay: Payer: PPO | Attending: Oncology

## 2021-05-25 ENCOUNTER — Inpatient Hospital Stay: Payer: PPO

## 2021-05-25 ENCOUNTER — Encounter: Payer: Self-pay | Admitting: Oncology

## 2021-05-25 ENCOUNTER — Inpatient Hospital Stay (HOSPITAL_BASED_OUTPATIENT_CLINIC_OR_DEPARTMENT_OTHER): Payer: PPO | Admitting: Oncology

## 2021-05-25 VITALS — BP 111/66 | HR 67 | Temp 96.4°F | Resp 18 | Wt 181.8 lb

## 2021-05-25 DIAGNOSIS — F32A Depression, unspecified: Secondary | ICD-10-CM | POA: Insufficient documentation

## 2021-05-25 DIAGNOSIS — D709 Neutropenia, unspecified: Secondary | ICD-10-CM | POA: Insufficient documentation

## 2021-05-25 DIAGNOSIS — C251 Malignant neoplasm of body of pancreas: Secondary | ICD-10-CM | POA: Insufficient documentation

## 2021-05-25 DIAGNOSIS — Z5111 Encounter for antineoplastic chemotherapy: Secondary | ICD-10-CM

## 2021-05-25 DIAGNOSIS — Z5189 Encounter for other specified aftercare: Secondary | ICD-10-CM | POA: Insufficient documentation

## 2021-05-25 DIAGNOSIS — R5381 Other malaise: Secondary | ICD-10-CM | POA: Insufficient documentation

## 2021-05-25 LAB — COMPREHENSIVE METABOLIC PANEL
ALT: 34 U/L (ref 0–44)
AST: 33 U/L (ref 15–41)
Albumin: 3.9 g/dL (ref 3.5–5.0)
Alkaline Phosphatase: 62 U/L (ref 38–126)
Anion gap: 8 (ref 5–15)
BUN: 22 mg/dL (ref 8–23)
CO2: 28 mmol/L (ref 22–32)
Calcium: 8.9 mg/dL (ref 8.9–10.3)
Chloride: 100 mmol/L (ref 98–111)
Creatinine, Ser: 0.79 mg/dL (ref 0.61–1.24)
GFR, Estimated: >60 mL/min (ref >60)
Glucose, Bld: 131 mg/dL — ABNORMAL HIGH (ref 70–99)
Potassium: 3.9 mmol/L (ref 3.5–5.1)
Sodium: 136 mmol/L (ref 135–145)
Total Bilirubin: 0.6 mg/dL (ref 0.3–1.2)
Total Protein: 6.6 g/dL (ref 6.5–8.1)

## 2021-05-25 LAB — CBC WITH DIFFERENTIAL/PLATELET
Abs Immature Granulocytes: 0.01 10*3/uL (ref 0.00–0.07)
Basophils Absolute: 0 10*3/uL (ref 0.0–0.1)
Basophils Relative: 1 %
Eosinophils Absolute: 0.1 10*3/uL (ref 0.0–0.5)
Eosinophils Relative: 2 %
HCT: 36.2 % — ABNORMAL LOW (ref 39.0–52.0)
Hemoglobin: 12.4 g/dL — ABNORMAL LOW (ref 13.0–17.0)
Immature Granulocytes: 0 %
Lymphocytes Relative: 36 %
Lymphs Abs: 1.8 10*3/uL (ref 0.7–4.0)
MCH: 30.8 pg (ref 26.0–34.0)
MCHC: 34.3 g/dL (ref 30.0–36.0)
MCV: 90 fL (ref 80.0–100.0)
Monocytes Absolute: 1.4 10*3/uL — ABNORMAL HIGH (ref 0.1–1.0)
Monocytes Relative: 29 %
Neutro Abs: 1.5 10*3/uL — ABNORMAL LOW (ref 1.7–7.7)
Neutrophils Relative %: 32 %
Platelets: 255 10*3/uL (ref 150–400)
RBC: 4.02 MIL/uL — ABNORMAL LOW (ref 4.22–5.81)
RDW: 16.5 % — ABNORMAL HIGH (ref 11.5–15.5)
WBC: 4.8 10*3/uL (ref 4.0–10.5)
nRBC: 0 % (ref 0.0–0.2)

## 2021-05-25 LAB — VITAMIN B12: Vitamin B-12: 2675 pg/mL — ABNORMAL HIGH (ref 180–914)

## 2021-05-25 MED ORDER — HEPARIN SOD (PORK) LOCK FLUSH 100 UNIT/ML IV SOLN
500.0000 [IU] | Freq: Once | INTRAVENOUS | Status: AC | PRN
  Filled 2021-05-25: qty 5

## 2021-05-25 MED ORDER — FAMOTIDINE 20 MG IN NS 100 ML IVPB
20.0000 mg | Freq: Once | INTRAVENOUS | Status: AC
  Administered 2021-05-25: 20 mg via INTRAVENOUS
  Filled 2021-05-25: qty 20

## 2021-05-25 MED ORDER — PACLITAXEL PROTEIN-BOUND CHEMO INJECTION 100 MG
80.0000 mg/m2 | Freq: Once | INTRAVENOUS | Status: AC
  Administered 2021-05-25: 175 mg via INTRAVENOUS
  Filled 2021-05-25: qty 35

## 2021-05-25 MED ORDER — SODIUM CHLORIDE 0.9 % IV SOLN
700.0000 mg/m2 | Freq: Once | INTRAVENOUS | Status: AC
  Administered 2021-05-25: 1520 mg via INTRAVENOUS
  Filled 2021-05-25: qty 26.3

## 2021-05-25 MED ORDER — SODIUM CHLORIDE 0.9 % IV SOLN
Freq: Once | INTRAVENOUS | Status: AC
  Filled 2021-05-25: qty 250

## 2021-05-25 MED ORDER — PEGFILGRASTIM 6 MG/0.6ML ~~LOC~~ PSKT
6.0000 mg | PREFILLED_SYRINGE | Freq: Once | SUBCUTANEOUS | Status: AC
  Administered 2021-05-25: 6 mg via SUBCUTANEOUS
  Filled 2021-05-25: qty 0.6

## 2021-05-25 MED ORDER — PROCHLORPERAZINE MALEATE 10 MG PO TABS
10.0000 mg | ORAL_TABLET | Freq: Once | ORAL | Status: AC
  Administered 2021-05-25: 10 mg via ORAL
  Filled 2021-05-25: qty 1

## 2021-05-25 MED ORDER — HEPARIN SOD (PORK) LOCK FLUSH 100 UNIT/ML IV SOLN
INTRAVENOUS | Status: AC
  Administered 2021-05-25: 500 [IU]
  Filled 2021-05-25: qty 5

## 2021-05-25 NOTE — Progress Notes (Signed)
Pt is not able to remember if he took his metoprol this morning and will like to know if he should take one just in case or skip it for the day. Also, will like to discuss future steps. Dr. Derrel Nip will like to add a B12 to his lab work today if possible.

## 2021-05-25 NOTE — Patient Instructions (Signed)
[facility] CANCER CTR AT [place]  Discharge Instructions: Thank you for choosing [facility] to provide your oncology and hematology care.  If you have a lab appointment with the [facility], please go directly to the [facility] and check in at the registration area.  Wear comfortable clothing and clothing appropriate for easy access to any Portacath or PICC line.   We strive to give you quality time with your provider. You may need to reschedule your appointment if you arrive late (15 or more minutes).  Arriving late affects you and other patients whose appointments are after yours.  Also, if you miss three or more appointments without notifying the office, you may be dismissed from the clinic at the provider's discretion.      For prescription refill requests, have your pharmacy contact our office and allow 72 hours for refills to be completed.    Today you received the following chemotherapy and/or immunotherapy agents Gemzar and Abraxane       To help prevent nausea and vomiting after your treatment, we encourage you to take your nausea medication as directed.  BELOW ARE SYMPTOMS THAT SHOULD BE REPORTED IMMEDIATELY: *FEVER GREATER THAN 100.4 F (38 C) OR HIGHER *CHILLS OR SWEATING *NAUSEA AND VOMITING THAT IS NOT CONTROLLED WITH YOUR NAUSEA MEDICATION *UNUSUAL SHORTNESS OF BREATH *UNUSUAL BRUISING OR BLEEDING *URINARY PROBLEMS (pain or burning when urinating, or frequent urination) *BOWEL PROBLEMS (unusual diarrhea, constipation, pain near the anus) TENDERNESS IN MOUTH AND THROAT WITH OR WITHOUT PRESENCE OF ULCERS (sore throat, sores in mouth, or a toothache) UNUSUAL RASH, SWELLING OR PAIN  UNUSUAL VAGINAL DISCHARGE OR ITCHING   Items with * indicate a potential emergency and should be followed up as soon as possible or go to the Emergency Department if any problems should occur.  Please show the CHEMOTHERAPY ALERT CARD or IMMUNOTHERAPY ALERT CARD at  check-in to the Emergency Department and triage nurse.  Should you have questions after your visit or need to cancel or reschedule your appointment, please contact [facility] CANCER [place] AT [place]  [phone] and follow the prompts.  Office hours are 8:00 a.m. to 4:30 p.m. Monday - Friday. Please note that voicemails left after 4:00 p.m. may not be returned until the following business day.  We are closed weekends and major holidays. You have access to a nurse at all times for urgent questions. Please call the main number to the clinic [phone] and follow the prompts.  For any non-urgent questions, you may also contact your provider using MyChart. We now offer e-Visits for anyone 50 and older to request care online for non-urgent symptoms. For details visit [email or web address].   Also download the MyChart app! Go to the app store, search "MyChart", open the app, select [place], and log in with your MyChart username and password.  Due to Covid, a mask is required upon entering the hospital/clinic. If you do not have a mask, one will be given to you upon arrival. For doctor visits, patients may have 1 support person aged 51 or older with them. For treatment visits, patients cannot have anyone with them due to current Covid guidelines and our immunocompromised population.

## 2021-05-25 NOTE — Progress Notes (Signed)
Hematology/Oncology Consult note [facility]  Telephone:([phone] Fax:([phone]  Patient Care Team: Crecencio Mc, MD as PCP - General (Internal Medicine) Clent Jacks, RN as Oncology Nurse Navigator   Name of the patient: George Owens  [id number]  Dec 21, 1931   Date of visit: 05/25/21  Diagnosis- pancreatic adenocarcinoma stage I BC T2N 0M0    Chief complaint/ Reason for visit-on treatment assessment prior to cycle 2-day 15 of gemcitabine Abraxane chemotherapy  Heme/Onc history: patient is a 87 year old male with a past medical history significant for GERD, hyperlipidemia, cirrhosis.  He was having symptoms of Abdominal pain and reflux and therefore underwent a CT abdomen and pelvis with contrast on 09/11/2020 which showed a hypointense ill-defined mass in the pancreatic body up to 25 mm.  No extrapancreatic infiltrative density about the aorta or proximal [name].  No local regional adenopathy or distant metastatic disease.  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  This was followed by an EUS at [facility] which showed a hypoechoic mass measuring 1.8 x 1.6 cm in the pancreatic body.  Endosonographic borders poorly defined.  Upstream pancreatic duct dilatation.  No abnormal appearing lymph nodes.  Endosonographic imaging of the liver showed no lesion.  Biopsy showed adenocarcinoma.   PET scan showed 2.7 x 2 cm ovoid mass in the body of the pancreas with an SUV of 7.1.  18 mm hypermetabolic nodule in the isthmus of the thyroid gland.  No evidence of local regional adenopathy or distant metastatic disease.   Patient was evaluated by Dr. Hyman Hopes at [facility] for consideration of surgery and has been deemed to be upfront resectable surgical candidate.  However plan was to offer him neoadjuvant chemotherapy for 3 months to control micrometastatic disease before proceeding with definitive surgery.  If ultimately patient decides not to proceed with surgery then radiation  at [facility] also remains an option.   Patient received 8 doses of gemcitabine and Abraxane chemotherapy 1 week on 1 week off due to neutropenia and thrombocytopenia.  Plan is to proceed with definitive surgery at this time as patient was also getting recurrent UTIs on chemotherapy.  Depending on his recovery after surgery further adjuvant chemotherapy can be considered   Patient underwent laparoscopy surgery by Dr. Hyman Hopes at [facility].  Final pathology showed 3 cm grade 3 poorly differentiated adenocarcinoma of the pancreas with negative margins.  15 lymph nodes negative for malignancy.  Absent chemotherapy response with extensive residual cancer and no evidence of tumor regression.  Perineural invasion identified lymphovascular invasion not identified.   Plan is to proceed with 4 cycles of adjuvant gemcitabine Abraxane 1 week on 1 week off if tolerated    Interval history-patient is tolerating chemotherapy well without any significant side effects.  He is still struggling with indigestion and is currently on Protonix and was also started on ranitidine by Dr. Derrel Nip.  He is on Creon tablets for pancreatic insufficiency and he is trying to see if he can cut it down to 1 tablet with each meal.  ECOG PS- 1 Pain scale- 0   Review of systems- Review of Systems  Constitutional:  Negative for chills, fever, malaise/fatigue and weight loss.  HENT:  Negative for congestion, ear discharge and nosebleeds.   Eyes:  Negative for blurred vision.  Respiratory:  Negative for cough, hemoptysis, sputum production, shortness of breath and wheezing.   Cardiovascular:  Negative for chest pain, palpitations, orthopnea and claudication.  Gastrointestinal:  Negative for abdominal pain, blood in stool,  constipation, diarrhea, heartburn, melena, nausea and vomiting.  Genitourinary:  Negative for dysuria, flank pain, frequency, hematuria and urgency.  Musculoskeletal:  Negative for back pain, joint pain and myalgias.  Skin:   Negative for rash.  Neurological:  Negative for dizziness, tingling, focal weakness, seizures, weakness and headaches.  Endo/Heme/Allergies:  Does not bruise/bleed easily.  Psychiatric/Behavioral:  Negative for depression and suicidal ideas. The patient does not have insomnia.      Allergies  Allergen Reactions   Kenalog [Triamcinolone Acetonide]     Blindness X 3 Days injection     Past Medical History:  Diagnosis Date   Allergy    Aneurysm of abdominal aorta    Basal cell carcinoma    right temple Dr. 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     Past Surgical History:  Procedure Laterality Date   CATARACT EXTRACTION Right    CATARACT EXTRACTION W/PHACO Left 12/26/2017   Procedure: CATARACT EXTRACTION PHACO AND INTRAOCULAR LENS PLACEMENT ([place])  COMPLICATED LEFT;  Surgeon: Leandrew Koyanagi, MD;  Location: [facility];  Service: Ophthalmology;  Laterality: Left;  [place]   colonoscopy     ESOPHAGOGASTRODUODENOSCOPY (EGD) WITH PROPOFOL     EYE MUSCLE SURGERY Left    PORTA CATH INSERTION N/A 10/19/2020   Procedure: PORTA CATH INSERTION;  Surgeon: Algernon Huxley, MD;  Location: [facility] CV LAB;  Service: Cardiovascular;  Laterality: N/A;   TONSILLECTOMY AND ADENOIDECTOMY      Social History   Socioeconomic History   Marital status: Married    Spouse name: Eloise   Number of children: 4   Years of education: Not on file   Highest education level: Not on file  Occupational History   Occupation: retired  Tobacco Use   Smoking status: Never   Smokeless tobacco: Never  Vaping Use   Vaping Use: Never used  Substance and Sexual Activity   Alcohol use: No   Drug use: No    Sexual activity: Yes    Partners: Female  Other Topics Concern   Not on file  Social History Narrative   Lives with wife in a 5 story retirement home.  He lives on the first floor.  4 year college degree.  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     Social Determinants of Health   Financial Resource Strain: Not on file  Food Insecurity: Not on file  Transportation Needs: Not on file  Physical Activity: Not on file  Stress: Not on file  Social Connections: Not on file  Intimate Partner Violence: Not on file    Family History  Problem Relation Age of Onset   Stroke Mother    Arthritis Father    Heart disease Father    Brain cancer Brother        1/2 brother (possibly related to mother NOT related to patient)   Colon cancer Neg Hx    Esophageal cancer Neg Hx    Pancreatic cancer Neg Hx    Stomach cancer Neg Hx    Liver disease Neg Hx      Current Outpatient Medications:  acetaminophen (TYLENOL) 325 MG tablet, Take 650 mg by mouth every 6 (six) hours as needed., Disp: , Rfl:    Calcium Carb-Cholecalciferol (CALCIUM 1000 + D PO), Take 1 tablet by mouth daily. , Disp: , Rfl:    Cholecalciferol 50 MCG (2000 UT) CAPS, Take 1 capsule by mouth daily., Disp: , Rfl:    Cranberry 1000 MG CAPS, Take 2 tablets by mouth daily., Disp: , Rfl:    CREON 36000-114000 units CPEP capsule, Take by mouth. 2 pills with meal, 1 pill with snack, Disp: , Rfl:    Denosumab (PROLIA [place]), Inject into the skin every 6 (six) months., Disp: , Rfl:    glucosamine-chondroitin 500-400 MG tablet, Take 1 tablet by mouth daily., Disp: , Rfl:    hydrocortisone (ANUSOL-HC) 2.5 % rectal cream, Place 1 application rectally 2 (two) times daily., Disp: 30 g, Rfl: 0   lidocaine-prilocaine (EMLA) cream, Apply to affected area once, Disp: 30 g, Rfl: 3   metoprolol succinate (TOPROL-XL) 50 MG 24 hr tablet, Take 1.5 tablets (75 mg total) by mouth daily. (Patient taking differently: Take 50 mg by  mouth daily.), Disp: 30 tablet, Rfl: 1   pantoprazole (PROTONIX) 40 MG tablet, TAKE 1 TABLET BY MOUTH EVERY DAY, Disp: 30 tablet, Rfl: 0   Ranibizumab (LUCENTIS IO), Inject into the eye as needed. As needed every three months., Disp: , Rfl:    tamsulosin (FLOMAX) 0.4 MG CAPS capsule, TAKE 1 CAPSULE BY MOUTH EVERY DAY AFTER SUPPER, Disp: 30 capsule, Rfl: 0   temazepam (RESTORIL) 22.5 MG capsule, Take 1 capsule (22.5 mg total) by mouth at bedtime as needed for sleep., Disp: 30 capsule, Rfl: 0   vitamin B-12 (CYANOCOBALAMIN) 1000 MCG tablet, Take 1,000 mcg by mouth in the morning and at bedtime., Disp: , Rfl:    ALPRAZolam (XANAX) 0.25 MG tablet, 0.25 mg at bedtime as needed. (Patient not taking: Reported on 05/25/2021), Disp: , Rfl:    loratadine (CLARITIN) 10 MG tablet, Take 10 mg by mouth daily as needed.  (Patient not taking: Reported on 05/25/2021), Disp: , Rfl:    phenazopyridine (PYRIDIUM) 95 MG tablet, Take 95 mg by mouth daily. 2a day (Patient not taking: Reported on 05/25/2021), Disp: , Rfl:    traZODone (DESYREL) 100 MG tablet, Take 1 tablet (100 mg total) by mouth at bedtime. May increase to full tablet If needed (Patient not taking: Reported on 05/25/2021), Disp: 90 tablet, Rfl: 1  Physical exam:  Vitals:   05/25/21 0909  BP: 111/66  Pulse: 67  Resp: 18  Temp: (!) 96.4 F (35.8 C)  SpO2: 100%  Weight: 181 lb 12.8 oz (82.5 kg)   Physical Exam Constitutional:      General: He is not in acute distress. Cardiovascular:     Rate and Rhythm: Normal rate and regular rhythm.     Heart sounds: Normal heart sounds.  Pulmonary:     Effort: Pulmonary effort is normal.     Breath sounds: Normal breath sounds.  Abdominal:     General: Bowel sounds are normal.     Palpations: Abdomen is soft.  Skin:    General: Skin is warm and dry.  Neurological:     Mental Status: He is alert and oriented to person, place, and time.     CMP Latest Ref Rng & Units 05/25/2021  Glucose 70 - 99 mg/dL  131(H)  BUN 8 - 23 mg/dL 22  Creatinine 0.61 - 1.24 mg/dL 0.79  Sodium 135 - 145 mmol/L  136  Potassium 3.5 - 5.1 mmol/L 3.9  Chloride 98 - 111 mmol/L 100  CO2 22 - 32 mmol/L 28  Calcium 8.9 - 10.3 mg/dL 8.9  Total Protein 6.5 - 8.1 g/dL 6.6  Total Bilirubin 0.3 - 1.2 mg/dL 0.6  Alkaline Phos 38 - 126 U/L 62  AST 15 - 41 U/L 33  ALT 0 - 44 U/L 34   CBC Latest Ref Rng & Units 05/25/2021  WBC 4.0 - 10.5 K/uL 4.8  Hemoglobin 13.0 - 17.0 g/dL 12.4(L)  Hematocrit 39.0 - 52.0 % 36.2(L)  Platelets 150 - 400 K/uL 255      Assessment and plan- Patient is a 87 y.o. male stage Ib pancreatic adenocarcinoma s/p 3 months of neoadjuvant gemcitabine Abraxane chemotherapy followed by roboticAssisted laparoscopy and distal pancreatectomy.  He is here for on treatment assessment prior to cycle 2-day 15 of gemcitabine Abraxane chemotherapy  Counts okay to proceed with cycle 2-day 15 of gemcitabine Abraxane chemotherapy today.  ANC is 1.6 and he will receive on for Neulasta with this treatment.  Return to clinic in 2 weeks and will see covering NP for cycle 3-day 1 of gemcitabine Abraxane chemotherapy and I will see him back in 4 weeks for cycle 3-day 15.  Plan is to complete 4 cycles followed by repeat scans   Visit Diagnosis 1. Malignant neoplasm of body of pancreas ([place])   2. Encounter for antineoplastic chemotherapy      Dr. Randa Evens, MD, MPH [facility] at [facility] [phone] 05/25/2021 12:49 PM

## 2021-05-26 ENCOUNTER — Encounter: Payer: Self-pay | Admitting: Internal Medicine

## 2021-05-30 ENCOUNTER — Other Ambulatory Visit: Payer: Self-pay | Admitting: Oncology

## 2021-05-31 NOTE — Telephone Encounter (Signed)
Benefit still in process.

## 2021-06-01 ENCOUNTER — Encounter: Payer: Self-pay | Admitting: Oncology

## 2021-06-08 ENCOUNTER — Other Ambulatory Visit: Payer: Self-pay

## 2021-06-08 ENCOUNTER — Inpatient Hospital Stay: Payer: PPO

## 2021-06-08 ENCOUNTER — Inpatient Hospital Stay (HOSPITAL_BASED_OUTPATIENT_CLINIC_OR_DEPARTMENT_OTHER): Payer: PPO | Admitting: Nurse Practitioner

## 2021-06-08 VITALS — BP 132/67 | HR 66

## 2021-06-08 VITALS — BP 121/75 | HR 77 | Temp 96.8°F | Resp 16 | Wt 182.6 lb

## 2021-06-08 DIAGNOSIS — C251 Malignant neoplasm of body of pancreas: Secondary | ICD-10-CM | POA: Diagnosis not present

## 2021-06-08 DIAGNOSIS — T451X5A Adverse effect of antineoplastic and immunosuppressive drugs, initial encounter: Secondary | ICD-10-CM

## 2021-06-08 DIAGNOSIS — Z5111 Encounter for antineoplastic chemotherapy: Secondary | ICD-10-CM | POA: Diagnosis not present

## 2021-06-08 DIAGNOSIS — D701 Agranulocytosis secondary to cancer chemotherapy: Secondary | ICD-10-CM

## 2021-06-08 LAB — CBC WITH DIFFERENTIAL/PLATELET
Abs Immature Granulocytes: 0.16 10*3/uL — ABNORMAL HIGH (ref 0.00–0.07)
Basophils Absolute: 0.1 10*3/uL (ref 0.0–0.1)
Basophils Relative: 1 %
Eosinophils Absolute: 0.1 10*3/uL (ref 0.0–0.5)
Eosinophils Relative: 1 %
HCT: 37.8 % — ABNORMAL LOW (ref 39.0–52.0)
Hemoglobin: 13 g/dL (ref 13.0–17.0)
Immature Granulocytes: 2 %
Lymphocytes Relative: 22 %
Lymphs Abs: 2.3 10*3/uL (ref 0.7–4.0)
MCH: 31 pg (ref 26.0–34.0)
MCHC: 34.4 g/dL (ref 30.0–36.0)
MCV: 90.2 fL (ref 80.0–100.0)
Monocytes Absolute: 2 10*3/uL — ABNORMAL HIGH (ref 0.1–1.0)
Monocytes Relative: 19 %
Neutro Abs: 5.8 10*3/uL (ref 1.7–7.7)
Neutrophils Relative %: 55 %
Platelets: 235 10*3/uL (ref 150–400)
RBC: 4.19 MIL/uL — ABNORMAL LOW (ref 4.22–5.81)
RDW: 18.1 % — ABNORMAL HIGH (ref 11.5–15.5)
WBC: 10.4 10*3/uL (ref 4.0–10.5)
nRBC: 0.2 % (ref 0.0–0.2)

## 2021-06-08 LAB — COMPREHENSIVE METABOLIC PANEL
ALT: 44 U/L (ref 0–44)
AST: 47 U/L — ABNORMAL HIGH (ref 15–41)
Albumin: 3.8 g/dL (ref 3.5–5.0)
Alkaline Phosphatase: 114 U/L (ref 38–126)
Anion gap: 8 (ref 5–15)
BUN: 23 mg/dL (ref 8–23)
CO2: 29 mmol/L (ref 22–32)
Calcium: 9.1 mg/dL (ref 8.9–10.3)
Chloride: 100 mmol/L (ref 98–111)
Creatinine, Ser: 0.77 mg/dL (ref 0.61–1.24)
GFR, Estimated: >60 mL/min (ref >60)
Glucose, Bld: 131 mg/dL — ABNORMAL HIGH (ref 70–99)
Potassium: 3.6 mmol/L (ref 3.5–5.1)
Sodium: 137 mmol/L (ref 135–145)
Total Bilirubin: 0.4 mg/dL (ref 0.3–1.2)
Total Protein: 7.1 g/dL (ref 6.5–8.1)

## 2021-06-08 MED ORDER — SODIUM CHLORIDE 0.9 % IV SOLN
Freq: Once | INTRAVENOUS | Status: AC
  Filled 2021-06-08: qty 250

## 2021-06-08 MED ORDER — SODIUM CHLORIDE 0.9% FLUSH
10.0000 mL | INTRAVENOUS | Status: DC | PRN
  Administered 2021-06-08: 08:00:00 10 mL via INTRAVENOUS
  Filled 2021-06-08: qty 10

## 2021-06-08 MED ORDER — PACLITAXEL PROTEIN-BOUND CHEMO INJECTION 100 MG
80.0000 mg/m2 | Freq: Once | INTRAVENOUS | Status: AC
  Administered 2021-06-08: 10:00:00 175 mg via INTRAVENOUS
  Filled 2021-06-08: qty 35

## 2021-06-08 MED ORDER — SODIUM CHLORIDE 0.9 % IV SOLN
700.0000 mg/m2 | Freq: Once | INTRAVENOUS | Status: AC
  Administered 2021-06-08: 11:00:00 1520 mg via INTRAVENOUS
  Filled 2021-06-08: qty 26.3

## 2021-06-08 MED ORDER — HEPARIN SOD (PORK) LOCK FLUSH 100 UNIT/ML IV SOLN
500.0000 [IU] | Freq: Once | INTRAVENOUS | Status: AC
  Administered 2021-06-08: 12:00:00 500 [IU] via INTRAVENOUS
  Filled 2021-06-08: qty 5

## 2021-06-08 MED ORDER — HEPARIN SOD (PORK) LOCK FLUSH 100 UNIT/ML IV SOLN
INTRAVENOUS | Status: AC
  Filled 2021-06-08: qty 5

## 2021-06-08 MED ORDER — HEPARIN SOD (PORK) LOCK FLUSH 100 UNIT/ML IV SOLN
500.0000 [IU] | Freq: Once | INTRAVENOUS | Status: DC | PRN
  Filled 2021-06-08: qty 5

## 2021-06-08 MED ORDER — PROCHLORPERAZINE MALEATE 10 MG PO TABS
10.0000 mg | ORAL_TABLET | Freq: Once | ORAL | Status: AC
  Administered 2021-06-08: 09:00:00 10 mg via ORAL
  Filled 2021-06-08: qty 1

## 2021-06-08 MED ORDER — LIDOCAINE-PRILOCAINE 2.5-2.5 % EX CREA: TOPICAL_CREAM | CUTANEOUS | 3 refills | Status: DC

## 2021-06-08 NOTE — Progress Notes (Signed)
Pt had Covid in September and wondering if/when it is safe to get the covid booster shot. Pt requesting refill of emla cream and flomax.

## 2021-06-08 NOTE — Patient Instructions (Signed)
[facility] CANCER CTR AT [place]-MEDICAL ONCOLOGY  Discharge Instructions: °Thank you for choosing [facility] to provide your oncology and hematology care.  °If you have a lab appointment with the Cancer Center, please go directly to the Cancer Center and check in at the registration area. ° °Wear comfortable clothing and clothing appropriate for easy access to any Portacath or PICC line.  ° °We strive to give you quality time with your provider. You may need to reschedule your appointment if you arrive late (15 or more minutes).  Arriving late affects you and other patients whose appointments are after yours.  Also, if you miss three or more appointments without notifying the office, you may be dismissed from the clinic at the provider’s discretion.    °  °For prescription refill requests, have your pharmacy contact our office and allow 72 hours for refills to be completed.   ° °Today you received the following chemotherapy and/or immunotherapy agents : Abraxane / Gemzar  °  °To help prevent nausea and vomiting after your treatment, we encourage you to take your nausea medication as directed. ° °BELOW ARE SYMPTOMS THAT SHOULD BE REPORTED IMMEDIATELY: °*FEVER GREATER THAN 100.4 F (38 °C) OR HIGHER °*CHILLS OR SWEATING °*NAUSEA AND VOMITING THAT IS NOT CONTROLLED WITH YOUR NAUSEA MEDICATION °*UNUSUAL SHORTNESS OF BREATH °*UNUSUAL BRUISING OR BLEEDING °*URINARY PROBLEMS (pain or burning when urinating, or frequent urination) °*BOWEL PROBLEMS (unusual diarrhea, constipation, pain near the anus) °TENDERNESS IN MOUTH AND THROAT WITH OR WITHOUT PRESENCE OF ULCERS (sore throat, sores in mouth, or a toothache) °UNUSUAL RASH, SWELLING OR PAIN  °UNUSUAL VAGINAL DISCHARGE OR ITCHING  ° °Items with * indicate a potential emergency and should be followed up as soon as possible or go to the Emergency Department if any problems should occur. ° °Please show the CHEMOTHERAPY ALERT CARD or IMMUNOTHERAPY ALERT CARD at  check-in to the Emergency Department and triage nurse. ° °Should you have questions after your visit or need to cancel or reschedule your appointment, please contact [facility] CANCER CTR AT [place]-MEDICAL ONCOLOGY  [phone] and follow the prompts.  Office hours are 8:00 a.m. to 4:30 p.m. Monday - Friday. Please note that voicemails left after 4:00 p.m. may not be returned until the following business day.  We are closed weekends and major holidays. You have access to a nurse at all times for urgent questions. Please call the main number to the clinic [phone] and follow the prompts. ° °For any non-urgent questions, you may also contact your provider using MyChart. We now offer e-Visits for anyone 18 and older to request care online for non-urgent symptoms. For details visit [email or web address]. °  °Also download the MyChart app! Go to the app store, search "MyChart", open the app, select [place], and log in with your MyChart username and password. ° °Due to Covid, a mask is required upon entering the hospital/clinic. If you do not have a mask, one will be given to you upon arrival. For doctor visits, patients may have 1 support person aged 18 or older with them. For treatment visits, patients cannot have anyone with them due to current Covid guidelines and our immunocompromised population.  °

## 2021-06-08 NOTE — Progress Notes (Signed)
Per NP ok to treat today and patient tolerated ABRAXANE and GEMZAR infusion well today, no concerns voiced. Per NP note hold off on Neulasta and give in 2 weeks, patient aware and verbalizes understanding, discharged. Stable.

## 2021-06-08 NOTE — Progress Notes (Signed)
Hematology/Oncology Consult Note [facility]  [phone] Fax:([phone]  Patient Care Team: Crecencio Mc, MD as PCP - General (Internal Medicine) Clent Jacks, RN as Oncology Nurse Navigator   Name of the patient: George Owens  [id number]  1932-05-18   Date of visit: 06/08/21  Diagnosis- pancreatic adenocarcinoma stage I BC T2N 0M0   Chief complaint/ Reason for visit-on treatment assessment prior to cycle 3-day 1 of Gemcitabine Abraxane chemotherapy  Heme/Onc history: patient is a 87 year old male with a past medical history significant for GERD, hyperlipidemia, cirrhosis.  He was having symptoms of Abdominal pain and reflux and therefore underwent a CT abdomen and pelvis with contrast on 09/11/2020 which showed a hypointense ill-defined mass in the pancreatic body up to 25 mm.  No extrapancreatic infiltrative density about the aorta or proximal [name].  No local regional adenopathy or distant metastatic disease.  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  This was followed by an EUS at [facility] which showed a hypoechoic mass measuring 1.8 x 1.6 cm in the pancreatic body.  Endosonographic borders poorly defined.  Upstream pancreatic duct dilatation.  No abnormal appearing lymph nodes.  Endosonographic imaging of the liver showed no lesion.  Biopsy showed adenocarcinoma.   PET scan showed 2.7 x 2 cm ovoid mass in the body of the pancreas with an SUV of 7.1.  18 mm hypermetabolic nodule in the isthmus of the thyroid gland.  No evidence of local regional adenopathy or distant metastatic disease.   Patient was evaluated by Dr. Hyman Hopes at [facility] for consideration of surgery and has been deemed to be upfront resectable surgical candidate.  However plan was to offer him neoadjuvant chemotherapy for 3 months to control micrometastatic disease before proceeding with definitive surgery.  If ultimately patient decides not to proceed with surgery then radiation at [facility]  also remains an option.   Patient received 8 doses of gemcitabine and Abraxane chemotherapy 1 week on 1 week off due to neutropenia and thrombocytopenia.  Plan is to proceed with definitive surgery at this time as patient was also getting recurrent UTIs on chemotherapy.  Depending on his recovery after surgery further adjuvant chemotherapy can be considered   Patient underwent laparoscopy surgery by Dr. Hyman Hopes at [facility].  Final pathology showed 3 cm grade 3 poorly differentiated adenocarcinoma of the pancreas with negative margins.  15 lymph nodes negative for malignancy.  Absent chemotherapy response with extensive residual cancer and no evidence of tumor regression.  Perineural invasion identified lymphovascular invasion not identified.   Plan is to proceed with 4 cycles of adjuvant gemcitabine Abraxane 1 week on 1 week off if tolerated    Interval history-patient is 87 year old male who returns to clinic for follow-up, labs, consideration of cycle 3-day 1 of adjuvant gemcitabine Abraxane chemotherapy.  He is continues to tolerate treatment well without significant side effects.  Complains of some depression and malaise which are generalized.  He declines any sort of treatment.  Indigestion is stable.  Creon continues to be tolerated well.  Denies other specific complaints.  ECOG PS- 1 Pain scale- 0  Review of systems- Review of Systems  Constitutional:  Negative for chills, fever, malaise/fatigue and weight loss.  HENT:  Negative for congestion, ear discharge and nosebleeds.   Eyes:  Negative for blurred vision.  Respiratory:  Negative for cough, hemoptysis, sputum production, shortness of breath and wheezing.   Cardiovascular:  Negative for chest pain, palpitations, orthopnea and claudication.  Gastrointestinal:  Negative for abdominal  pain, blood in stool, constipation, diarrhea, heartburn, melena, nausea and vomiting.  Genitourinary:  Negative for dysuria, flank pain, frequency, hematuria  and urgency.  Musculoskeletal:  Negative for back pain, joint pain and myalgias.  Skin:  Negative for rash.  Neurological:  Negative for dizziness, tingling, focal weakness, seizures, weakness and headaches.  Endo/Heme/Allergies:  Does not bruise/bleed easily.  Psychiatric/Behavioral:  Negative for depression and suicidal ideas. The patient does not have insomnia.      Allergies  Allergen Reactions   Kenalog [Triamcinolone Acetonide]     Blindness X 3 Days injection     Past Medical History:  Diagnosis Date   Allergy    Aneurysm of abdominal aorta    Basal cell carcinoma    right temple Dr. 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     Past Surgical History:  Procedure Laterality Date   CATARACT EXTRACTION Right    CATARACT EXTRACTION W/PHACO Left 12/26/2017   Procedure: CATARACT EXTRACTION PHACO AND INTRAOCULAR LENS PLACEMENT ([place])  COMPLICATED LEFT;  Surgeon: Leandrew Koyanagi, MD;  Location: [place];  Service: Ophthalmology;  Laterality: Left;  [place]   colonoscopy     ESOPHAGOGASTRODUODENOSCOPY (EGD) WITH PROPOFOL     EYE MUSCLE SURGERY Left    PORTA CATH INSERTION N/A 10/19/2020   Procedure: PORTA CATH INSERTION;  Surgeon: Algernon Huxley, MD;  Location: [place] [facility];  Service: Cardiovascular;  Laterality: N/A;   TONSILLECTOMY AND ADENOIDECTOMY      Social History   Socioeconomic History   Marital status: Married    Spouse name: Eloise   Number of children: 4   Years of education: Not on file   Highest education level: Not on file  Occupational History   Occupation: retired  Tobacco Use   Smoking status: Never   Smokeless tobacco: Never  Vaping Use    Vaping Use: Never used  Substance and Sexual Activity   Alcohol use: No   Drug use: No   Sexual activity: Yes    Partners: Female  Other Topics Concern   Not on file  Social History Narrative   Lives with wife in a 5 story retirement home.  He lives on the first floor.  4 year college degree.  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     Social Determinants of Health   Financial Resource Strain: Not on file  Food Insecurity: Not on file  Transportation Needs: Not on file  Physical Activity: Not on file  Stress: Not on file  Social Connections: Not on file  Intimate Partner Violence: Not on file    Family History  Problem Relation Age of Onset   Stroke Mother    Arthritis Father    Heart disease Father    Brain cancer Brother        1/2 brother (possibly related to mother NOT related to patient)   Colon cancer Neg Hx    Esophageal cancer Neg Hx    Pancreatic cancer Neg Hx    Stomach cancer Neg Hx    Liver disease Neg Hx      Current  Outpatient Medications:    acetaminophen (TYLENOL) 325 MG tablet, Take 650 mg by mouth every 6 (six) hours as needed., Disp: , Rfl:    ALPRAZolam (XANAX) 0.25 MG tablet, 0.25 mg at bedtime as needed. (Patient not taking: Reported on 05/25/2021), Disp: , Rfl:    Calcium Carb-Cholecalciferol (CALCIUM 1000 + D PO), Take 1 tablet by mouth daily. , Disp: , Rfl:    Cholecalciferol 50 MCG (2000 UT) CAPS, Take 1 capsule by mouth daily., Disp: , Rfl:    Cranberry 1000 MG CAPS, Take 2 tablets by mouth daily., Disp: , Rfl:    CREON 36000-114000 units CPEP capsule, Take by mouth. 2 pills with meal, 1 pill with snack, Disp: , Rfl:    Denosumab (PROLIA [place]), Inject into the skin every 6 (six) months., Disp: , Rfl:    glucosamine-chondroitin 500-400 MG tablet, Take 1 tablet by mouth daily., Disp: , Rfl:    hydrocortisone (ANUSOL-HC) 2.5 % rectal cream, Place 1 application rectally 2 (two) times daily., Disp: 30 g, Rfl: 0    lidocaine-prilocaine (EMLA) cream, Apply to affected area once, Disp: 30 g, Rfl: 3   loratadine (CLARITIN) 10 MG tablet, Take 10 mg by mouth daily as needed.  (Patient not taking: Reported on 05/25/2021), Disp: , Rfl:    metoprolol succinate (TOPROL-XL) 50 MG 24 hr tablet, Take 1.5 tablets (75 mg total) by mouth daily. (Patient taking differently: Take 50 mg by mouth daily.), Disp: 30 tablet, Rfl: 1   pantoprazole (PROTONIX) 40 MG tablet, TAKE 1 TABLET BY MOUTH EVERY DAY, Disp: 30 tablet, Rfl: 0   phenazopyridine (PYRIDIUM) 95 MG tablet, Take 95 mg by mouth daily. 2a day (Patient not taking: Reported on 05/25/2021), Disp: , Rfl:    Ranibizumab (LUCENTIS IO), Inject into the eye as needed. As needed every three months., Disp: , Rfl:    tamsulosin (FLOMAX) 0.4 MG CAPS capsule, TAKE 1 CAPSULE BY MOUTH EVERY DAY AFTER SUPPER, Disp: 30 capsule, Rfl: 0   temazepam (RESTORIL) 22.5 MG capsule, Take 1 capsule (22.5 mg total) by mouth at bedtime as needed for sleep., Disp: 30 capsule, Rfl: 0   traZODone (DESYREL) 100 MG tablet, Take 1 tablet (100 mg total) by mouth at bedtime. May increase to full tablet If needed (Patient not taking: Reported on 05/25/2021), Disp: 90 tablet, Rfl: 1   vitamin B-12 (CYANOCOBALAMIN) 1000 MCG tablet, Take 1,000 mcg by mouth in the morning and at bedtime., Disp: , Rfl:  No current facility-administered medications for this visit.  Facility-Administered Medications Ordered in Other Visits:    heparin lock flush 100 unit/mL, 500 Units, Intravenous, Once, Sindy Guadeloupe, MD   sodium chloride flush (NS) 0.9 % injection 10 mL, 10 mL, Intravenous, PRN, Sindy Guadeloupe, MD, 10 mL at 06/08/21 0807  Physical exam:  Vitals:   06/08/21 0820  BP: 121/75  Pulse: 77  Resp: 16  Temp: (!) 96.8 F (36 C)  SpO2: 99%  Weight: 182 lb 9.6 oz (82.8 kg)   Physical Exam Constitutional:      General: He is not in acute distress. Cardiovascular:     Rate and Rhythm: Normal rate and regular  rhythm.     Heart sounds: Normal heart sounds.  Pulmonary:     Effort: Pulmonary effort is normal.     Breath sounds: Normal breath sounds.  Abdominal:     General: Bowel sounds are normal.     Palpations: Abdomen is soft.  Skin:    General: Skin is  warm and dry.  Neurological:     Mental Status: He is alert and oriented to person, place, and time.     CMP Latest Ref Rng & Units 05/25/2021  Glucose 70 - 99 mg/dL 131(H)  BUN 8 - 23 mg/dL 22  Creatinine 0.61 - 1.24 mg/dL 0.79  Sodium 135 - 145 mmol/L 136  Potassium 3.5 - 5.1 mmol/L 3.9  Chloride 98 - 111 mmol/L 100  CO2 22 - 32 mmol/L 28  Calcium 8.9 - 10.3 mg/dL 8.9  Total Protein 6.5 - 8.1 g/dL 6.6  Total Bilirubin 0.3 - 1.2 mg/dL 0.6  Alkaline Phos 38 - 126 U/L 62  AST 15 - 41 U/L 33  ALT 0 - 44 U/L 34   CBC Latest Ref Rng & Units 06/08/2021  WBC 4.0 - 10.5 K/uL 10.4  Hemoglobin 13.0 - 17.0 g/dL 13.0  Hematocrit 39.0 - 52.0 % 37.8(L)  Platelets 150 - 400 K/uL 235     Assessment and plan- Patient is a 87 y.o. male with stage Ib pancreatic adenocarcinoma status post 3 months of neoadjuvant gemcitabine-Abraxane chemotherapy followed by robot assisted laparoscopy and distal pancreatectomy.  Returns to clinic for treatment assessment prior to cycle 3-day 1 of adjuvant gemcitabine Abraxane chemotherapy.  Labs reviewed and acceptable for continuation of treatment.  Due to ongoing neutropenia he will receive Neulasta with treatment. He will not receive today but will 2 weeks from now. Otherwise tolerating treatment well without significant side effects. Some malaise and depression today. Declines medication.  Refilled emla cream today.   Denies diarrhea today. Tolerating creon well.   Again reviewed that the plan is to complete 4 cycles of adjuvant chemotherapy followed by repeat scans.  He will follow-up with Dr. Janese Banks in 2 weeks for cycle 3-day 15 with neulasta support.   Visit Diagnosis 1. Encounter for antineoplastic  chemotherapy   2. Malignant neoplasm of body of pancreas ([place])    Beckey Rutter, [place], AGNP-C [place] at [facility] [phone] (clinic) 06/08/2021

## 2021-06-09 LAB — CANCER ANTIGEN 19-9: CA 19-9: 19 U/mL (ref 0–35)

## 2021-06-14 ENCOUNTER — Telehealth: Payer: Self-pay | Admitting: Internal Medicine

## 2021-06-15 ENCOUNTER — Encounter: Payer: Self-pay | Admitting: *Deleted

## 2021-06-15 NOTE — Telephone Encounter (Signed)
Refilled: 05/21/2021 Last OV: 05/17/2021 Next OV: not scheduled

## 2021-06-22 ENCOUNTER — Inpatient Hospital Stay: Payer: PPO

## 2021-06-22 ENCOUNTER — Inpatient Hospital Stay: Payer: PPO | Attending: Oncology

## 2021-06-22 ENCOUNTER — Other Ambulatory Visit: Payer: Self-pay

## 2021-06-22 ENCOUNTER — Encounter: Payer: Self-pay | Admitting: Oncology

## 2021-06-22 ENCOUNTER — Inpatient Hospital Stay (HOSPITAL_BASED_OUTPATIENT_CLINIC_OR_DEPARTMENT_OTHER): Payer: PPO | Admitting: Oncology

## 2021-06-22 VITALS — BP 111/67 | HR 71 | Temp 98.2°F | Resp 16 | Ht 76.0 in | Wt 184.2 lb

## 2021-06-22 DIAGNOSIS — C251 Malignant neoplasm of body of pancreas: Secondary | ICD-10-CM

## 2021-06-22 DIAGNOSIS — Z79899 Other long term (current) drug therapy: Secondary | ICD-10-CM | POA: Insufficient documentation

## 2021-06-22 DIAGNOSIS — Z5111 Encounter for antineoplastic chemotherapy: Secondary | ICD-10-CM | POA: Diagnosis not present

## 2021-06-22 DIAGNOSIS — Z5189 Encounter for other specified aftercare: Secondary | ICD-10-CM | POA: Diagnosis not present

## 2021-06-22 LAB — CBC WITH DIFFERENTIAL/PLATELET
Abs Immature Granulocytes: 0.01 10*3/uL (ref 0.00–0.07)
Basophils Absolute: 0 10*3/uL (ref 0.0–0.1)
Basophils Relative: 1 %
Eosinophils Absolute: 0.2 10*3/uL (ref 0.0–0.5)
Eosinophils Relative: 3 %
HCT: 33.9 % — ABNORMAL LOW (ref 39.0–52.0)
Hemoglobin: 11.9 g/dL — ABNORMAL LOW (ref 13.0–17.0)
Immature Granulocytes: 0 %
Lymphocytes Relative: 35 %
Lymphs Abs: 1.8 10*3/uL (ref 0.7–4.0)
MCH: 32.3 pg (ref 26.0–34.0)
MCHC: 35.1 g/dL (ref 30.0–36.0)
MCV: 92.1 fL (ref 80.0–100.0)
Monocytes Absolute: 1.3 10*3/uL — ABNORMAL HIGH (ref 0.1–1.0)
Monocytes Relative: 25 %
Neutro Abs: 1.8 10*3/uL (ref 1.7–7.7)
Neutrophils Relative %: 36 %
Platelets: 248 10*3/uL (ref 150–400)
RBC: 3.68 MIL/uL — ABNORMAL LOW (ref 4.22–5.81)
RDW: 18.1 % — ABNORMAL HIGH (ref 11.5–15.5)
WBC: 5.1 10*3/uL (ref 4.0–10.5)
nRBC: 0 % (ref 0.0–0.2)

## 2021-06-22 LAB — COMPREHENSIVE METABOLIC PANEL
ALT: 38 U/L (ref 0–44)
AST: 35 U/L (ref 15–41)
Albumin: 3.8 g/dL (ref 3.5–5.0)
Alkaline Phosphatase: 82 U/L (ref 38–126)
Anion gap: 8 (ref 5–15)
BUN: 25 mg/dL — ABNORMAL HIGH (ref 8–23)
CO2: 28 mmol/L (ref 22–32)
Calcium: 9.1 mg/dL (ref 8.9–10.3)
Chloride: 100 mmol/L (ref 98–111)
Creatinine, Ser: 0.9 mg/dL (ref 0.61–1.24)
GFR, Estimated: >60 mL/min (ref >60)
Glucose, Bld: 162 mg/dL — ABNORMAL HIGH (ref 70–99)
Potassium: 3.8 mmol/L (ref 3.5–5.1)
Sodium: 136 mmol/L (ref 135–145)
Total Bilirubin: 0.6 mg/dL (ref 0.3–1.2)
Total Protein: 6.7 g/dL (ref 6.5–8.1)

## 2021-06-22 MED ORDER — HEPARIN SOD (PORK) LOCK FLUSH 100 UNIT/ML IV SOLN
INTRAVENOUS | Status: AC
  Administered 2021-06-22: 500 [IU]
  Filled 2021-06-22: qty 5

## 2021-06-22 MED ORDER — PACLITAXEL PROTEIN-BOUND CHEMO INJECTION 100 MG
80.0000 mg/m2 | Freq: Once | INTRAVENOUS | Status: AC
  Administered 2021-06-22: 175 mg via INTRAVENOUS
  Filled 2021-06-22: qty 35

## 2021-06-22 MED ORDER — FAMOTIDINE IN NACL 20-0.9 MG/50ML-% IV SOLN
20.0000 mg | Freq: Once | INTRAVENOUS | Status: AC
  Administered 2021-06-22: 20 mg via INTRAVENOUS
  Filled 2021-06-22: qty 50

## 2021-06-22 MED ORDER — PEGFILGRASTIM 6 MG/0.6ML ~~LOC~~ PSKT
6.0000 mg | PREFILLED_SYRINGE | Freq: Once | SUBCUTANEOUS | Status: AC
  Administered 2021-06-22: 6 mg via SUBCUTANEOUS
  Filled 2021-06-22: qty 0.6

## 2021-06-22 MED ORDER — SODIUM CHLORIDE 0.9 % IV SOLN
Freq: Once | INTRAVENOUS | Status: AC
  Filled 2021-06-22: qty 250

## 2021-06-22 MED ORDER — HEPARIN SOD (PORK) LOCK FLUSH 100 UNIT/ML IV SOLN
500.0000 [IU] | Freq: Once | INTRAVENOUS | Status: AC | PRN
  Filled 2021-06-22: qty 5

## 2021-06-22 MED ORDER — SODIUM CHLORIDE 0.9% FLUSH
10.0000 mL | Freq: Once | INTRAVENOUS | Status: AC
  Administered 2021-06-22: 10 mL via INTRAVENOUS
  Filled 2021-06-22: qty 10

## 2021-06-22 MED ORDER — PROCHLORPERAZINE MALEATE 10 MG PO TABS
10.0000 mg | ORAL_TABLET | Freq: Once | ORAL | Status: AC
  Administered 2021-06-22: 10 mg via ORAL
  Filled 2021-06-22: qty 1

## 2021-06-22 MED ORDER — SODIUM CHLORIDE 0.9 % IV SOLN
700.0000 mg/m2 | Freq: Once | INTRAVENOUS | Status: AC
  Administered 2021-06-22: 1520 mg via INTRAVENOUS
  Filled 2021-06-22: qty 15.78

## 2021-06-22 NOTE — Progress Notes (Signed)
Hematology/Oncology Consult note [facility]  [phone] [phone]  Patient Care Team: Crecencio Mc, MD as PCP - General (Internal Medicine) Clent Jacks, RN as Oncology Nurse Navigator   Name of the patient: George Owens  [id number]  09-14-1931   Date of visit: 06/22/21  Diagnosis- pancreatic adenocarcinoma stage I BC T2N 0M0  Chief complaint/ Reason for visit-on treatment assessment prior to cycle 3-day 15 of gemcitabine Abraxane chemotherapy  Heme/Onc history: patient is a 88 year old male with a past medical history significant for GERD, hyperlipidemia, cirrhosis.  He was having symptoms of Abdominal pain and reflux and therefore underwent a CT abdomen and pelvis with contrast on 09/11/2020 which showed a hypointense ill-defined mass in the pancreatic body up to 25 mm.  No extrapancreatic infiltrative density about the aorta or proximal [name].  No local regional adenopathy or distant metastatic disease.  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  This was followed by an EUS at [facility] which showed a hypoechoic mass measuring 1.8 x 1.6 cm in the pancreatic body.  Endosonographic borders poorly defined.  Upstream pancreatic duct dilatation.  No abnormal appearing lymph nodes.  Endosonographic imaging of the liver showed no lesion.  Biopsy showed adenocarcinoma.   PET scan showed 2.7 x 2 cm ovoid mass in the body of the pancreas with an SUV of 7.1.  18 mm hypermetabolic nodule in the isthmus of the thyroid gland.  No evidence of local regional adenopathy or distant metastatic disease.   Patient was evaluated by Dr. Hyman Hopes at [facility] for consideration of surgery and has been deemed to be upfront resectable surgical candidate.  However plan was to offer him neoadjuvant chemotherapy for 3 months to control micrometastatic disease before proceeding with definitive surgery.  If ultimately patient decides not to proceed with surgery then radiation at  [facility] also remains an option.   Patient received 8 doses of gemcitabine and Abraxane chemotherapy 1 week on 1 week off due to neutropenia and thrombocytopenia.  Plan is to proceed with definitive surgery at this time as patient was also getting recurrent UTIs on chemotherapy.  Depending on his recovery after surgery further adjuvant chemotherapy can be considered   Patient underwent laparoscopy surgery by Dr. Hyman Hopes at [facility].  Final pathology showed 3 cm grade 3 poorly differentiated adenocarcinoma of the pancreas with negative margins.  15 lymph nodes negative for malignancy.  Absent chemotherapy response with extensive residual cancer and no evidence of tumor regression.  Perineural invasion identified lymphovascular invasion not identified.   Plan is to proceed with 4 cycles of adjuvant gemcitabine Abraxane 1 week on 1 week off if tolerated  Interval history-patient is tolerating chemotherapy well and denies any specific complaints at this time.  He has also cut back on his Creon and has tolerated it well without any significant indigestion  ECOG PS- 1 Pain scale- 0   Review of systems- Review of Systems  Constitutional:  Negative for chills, fever, malaise/fatigue and weight loss.  HENT:  Negative for congestion, ear discharge and nosebleeds.   Eyes:  Negative for blurred vision.  Respiratory:  Negative for cough, hemoptysis, sputum production, shortness of breath and wheezing.   Cardiovascular:  Negative for chest pain, palpitations, orthopnea and claudication.  Gastrointestinal:  Negative for abdominal pain, blood in stool, constipation, diarrhea, heartburn, melena, nausea and vomiting.  Genitourinary:  Negative for dysuria, flank pain, frequency, hematuria and urgency.  Musculoskeletal:  Negative for back pain, joint pain and myalgias.  Skin:  Negative for rash.  Neurological:  Negative for dizziness, tingling, focal weakness, seizures, weakness and headaches.  Endo/Heme/Allergies:   Does not bruise/bleed easily.  Psychiatric/Behavioral:  Negative for depression and suicidal ideas. The patient does not have insomnia.      Allergies  Allergen Reactions   Kenalog [Triamcinolone Acetonide]     Blindness X 3 Days injection     Past Medical History:  Diagnosis Date   Allergy    Aneurysm of abdominal aorta    Basal cell carcinoma    right temple Dr. 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     Past Surgical History:  Procedure Laterality Date   CATARACT EXTRACTION Right    CATARACT EXTRACTION W/PHACO Left 12/26/2017   Procedure: CATARACT EXTRACTION PHACO AND INTRAOCULAR LENS PLACEMENT ([place])  COMPLICATED LEFT;  Surgeon: Leandrew Koyanagi, MD;  Location: [place];  Service: Ophthalmology;  Laterality: Left;  [place]   colonoscopy     ESOPHAGOGASTRODUODENOSCOPY (EGD) WITH PROPOFOL     EYE MUSCLE SURGERY Left    PORTA CATH INSERTION N/A 10/19/2020   Procedure: PORTA CATH INSERTION;  Surgeon: Algernon Huxley, MD;  Location: [place] [facility];  Service: Cardiovascular;  Laterality: N/A;   TONSILLECTOMY AND ADENOIDECTOMY      Social History   Socioeconomic History   Marital status: Married    Spouse name: Eloise   Number of children: 4   Years of education: Not on file   Highest education level: Not on file  Occupational History   Occupation: retired  Tobacco Use   Smoking status: Never   Smokeless tobacco: Never  Vaping Use   Vaping Use: Never used  Substance and Sexual Activity   Alcohol use: No   Drug use: No   Sexual activity: Yes    Partners: Female  Other Topics Concern   Not on file  Social History Narrative   Lives with wife in a 5 story  retirement home.  He lives on the first floor.  4 year college degree.  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     Social Determinants of Health   Financial Resource Strain: Not on file  Food Insecurity: Not on file  Transportation Needs: Not on file  Physical Activity: Not on file  Stress: Not on file  Social Connections: Not on file  Intimate Partner Violence: Not on file    Family History  Problem Relation Age of Onset   Stroke Mother    Arthritis Father    Heart disease Father    Brain cancer Brother        1/2 brother (possibly related to mother NOT related to patient)   Colon cancer Neg Hx    Esophageal cancer Neg Hx    Pancreatic cancer Neg Hx    Stomach cancer Neg Hx    Liver disease Neg Hx      Current Outpatient Medications:    acetaminophen (TYLENOL) 325 MG tablet, Take 650 mg by mouth every 6 (six) hours as needed., Disp: , Rfl:    ALPRAZolam (XANAX) 0.25 MG tablet, 0.25 mg at  bedtime as needed., Disp: , Rfl:    Calcium Carb-Cholecalciferol (CALCIUM 1000 + D PO), Take 1 tablet by mouth daily. , Disp: , Rfl:    Cholecalciferol 50 MCG (2000 UT) CAPS, Take 1 capsule by mouth daily., Disp: , Rfl:    Cranberry 1000 MG CAPS, Take 2 tablets by mouth daily., Disp: , Rfl:    CREON 36000-114000 units CPEP capsule, Take 36,000 Units by mouth. 1 pills with meal, o with  pill with snack, Disp: , Rfl:    Denosumab (PROLIA [place]), Inject into the skin every 6 (six) months., Disp: , Rfl:    famotidine (PEPCID) 10 MG tablet, Take 10 mg by mouth. prn, Disp: , Rfl:    glucosamine-chondroitin 500-400 MG tablet, Take 1 tablet by mouth daily., Disp: , Rfl:    hydrocortisone (ANUSOL-HC) 2.5 % rectal cream, Place 1 application rectally 2 (two) times daily., Disp: 30 g, Rfl: 0   lidocaine-prilocaine (EMLA) cream, Apply to affected area once, Disp: 30 g, Rfl: 3   loratadine (CLARITIN) 10 MG tablet, Take 10 mg by mouth daily as needed., Disp: , Rfl:    metoprolol  succinate (TOPROL-XL) 50 MG 24 hr tablet, Take 1.5 tablets (75 mg total) by mouth daily. (Patient taking differently: Take 50 mg by mouth daily.), Disp: 30 tablet, Rfl: 1   omeprazole (PRILOSEC) 40 MG capsule, Take 40 mg by mouth daily., Disp: , Rfl:    Ranibizumab (LUCENTIS IO), Inject into the eye as needed. As needed every three months., Disp: , Rfl:    tamsulosin (FLOMAX) 0.4 MG CAPS capsule, TAKE 1 CAPSULE BY MOUTH EVERY DAY AFTER SUPPER, Disp: 30 capsule, Rfl: 0   temazepam (RESTORIL) 22.5 MG capsule, TAKE 1 CAPSULE (22.5 MG TOTAL) BY MOUTH AT BEDTIME AS NEEDED FOR SLEEP., Disp: 30 capsule, Rfl: 0   vitamin B-12 (CYANOCOBALAMIN) 1000 MCG tablet, Take 1,000 mcg by mouth in the morning and at bedtime., Disp: , Rfl:    phenazopyridine (PYRIDIUM) 95 MG tablet, Take 95 mg by mouth daily. 2a day (Patient not taking: Reported on 06/22/2021), Disp: , Rfl:  No current facility-administered medications for this visit.  Facility-Administered Medications Ordered in Other Visits:    heparin lock flush 100 UNIT/ML injection, , , ,   Physical exam:  Vitals:   06/22/21 0900  BP: 111/67  Pulse: 71  Resp: 16  Temp: 98.2 F (36.8 C)  TempSrc: Tympanic  Weight: 184 lb 3.2 oz (83.6 kg)  Height: 6\' 4"  (1.93 m)   Physical Exam Constitutional:      General: He is not in acute distress. Cardiovascular:     Rate and Rhythm: Normal rate and regular rhythm.     Heart sounds: Normal heart sounds.  Pulmonary:     Effort: Pulmonary effort is normal.     Breath sounds: Normal breath sounds.  Skin:    General: Skin is warm and dry.  Neurological:     Mental Status: He is alert and oriented to person, place, and time.     CMP Latest Ref Rng & Units 06/22/2021  Glucose 70 - 99 mg/dL 162(H)  BUN 8 - 23 mg/dL 25(H)  Creatinine 0.61 - 1.24 mg/dL 0.90  Sodium 135 - 145 mmol/L 136  Potassium 3.5 - 5.1 mmol/L 3.8  Chloride 98 - 111 mmol/L 100  CO2 22 - 32 mmol/L 28  Calcium 8.9 - 10.3 mg/dL 9.1  Total  Protein 6.5 - 8.1 g/dL 6.7  Total Bilirubin 0.3 - 1.2 mg/dL 0.6  Alkaline Phos 38 - 126 U/L 82  AST 15 - 41 U/L 35  ALT 0 - 44 U/L 38   CBC Latest Ref Rng & Units 06/22/2021  WBC 4.0 - 10.5 K/uL 5.1  Hemoglobin 13.0 - 17.0 g/dL 11.9(L)  Hematocrit 39.0 - 52.0 % 33.9(L)  Platelets 150 - 400 K/uL 248     Assessment and plan- Patient is a 88 y.o. male stage Ib pancreatic adenocarcinoma s/p 3 months of neoadjuvant gemcitabine Abraxane chemotherapy followed by roboticAssisted laparoscopy and distal pancreatectomy.  He is here for on treatment assessment prior to cycle 3-day 15 of gemcitabine Abraxane chemotherapy  Counts okay to proceed with cycle 3-day 15 of gemcitabine Abraxane chemotherapy today with on pro Neulasta support.  I will see him in 2 weeks for cycle 4-day 1 of chemotherapy.  Plan is to stop treatment after 4 cycles followed by repeat scans.  Overall he is tolerating his treatment well with stable counts so far   Visit Diagnosis 1. Encounter for antineoplastic chemotherapy   2. Malignant neoplasm of body of pancreas ([place])      Dr. Randa Evens, MD, MPH [facility] at [facility] [phone] 06/22/2021 11:55 AM

## 2021-06-22 NOTE — Progress Notes (Signed)
Pt doing good. Taking 1 creon with meals and none at snack time. Also walked a mile yest. And wants to know if he can have a covid booster # 2

## 2021-06-22 NOTE — Patient Instructions (Signed)
[facility] AT [place]  Discharge Instructions: Thank you for choosing [facility] to provide your oncology and hematology care.  If you have a lab appointment with the [facility], please go directly to the [facility] and check in at the registration area.  Wear comfortable clothing and clothing appropriate for easy access to any Portacath or PICC line.   We strive to give you quality time with your provider. You may need to reschedule your appointment if you arrive late (15 or more minutes).  Arriving late affects you and other patients whose appointments are after yours.  Also, if you miss three or more appointments without notifying the office, you may be dismissed from the clinic at the providers discretion.      For prescription refill requests, have your pharmacy contact our office and allow 72 hours for refills to be completed.    Today you received the following chemotherapy and/or immunotherapy agents ABRAXENE & GEMZAR      To help prevent nausea and vomiting after your treatment, we encourage you to take your nausea medication as directed.  BELOW ARE SYMPTOMS THAT SHOULD BE REPORTED IMMEDIATELY: *FEVER GREATER THAN 100.4 F (38 C) OR HIGHER *CHILLS OR SWEATING *NAUSEA AND VOMITING THAT IS NOT CONTROLLED WITH YOUR NAUSEA MEDICATION *UNUSUAL SHORTNESS OF BREATH *UNUSUAL BRUISING OR BLEEDING *URINARY PROBLEMS (pain or burning when urinating, or frequent urination) *BOWEL PROBLEMS (unusual diarrhea, constipation, pain near the anus) TENDERNESS IN MOUTH AND THROAT WITH OR WITHOUT PRESENCE OF ULCERS (sore throat, sores in mouth, or a toothache) UNUSUAL RASH, SWELLING OR PAIN  UNUSUAL VAGINAL DISCHARGE OR ITCHING   Items with * indicate a potential emergency and should be followed up as soon as possible or go to the Emergency Department if any problems should occur.  Please show the CHEMOTHERAPY ALERT CARD or IMMUNOTHERAPY ALERT CARD at  check-in to the Emergency Department and triage nurse.  Should you have questions after your visit or need to cancel or reschedule your appointment, please contact [facility] [place]  [phone] and follow the prompts.  Office hours are 8:00 a.m. to 4:30 p.m. Monday - Friday. Please note that voicemails left after 4:00 p.m. may not be returned until the following business day.  We are closed weekends and major holidays. You have access to a nurse at all times for urgent questions. Please call the main number to the clinic [phone] and follow the prompts.  For any non-urgent questions, you may also contact your provider using MyChart. We now offer e-Visits for anyone 63 and older to request care online for non-urgent symptoms. For details visit [email or web address].   Also download the MyChart app! Go to the app store, search "MyChart", open the app, select [facility], and log in with your MyChart username and password.  Due to Covid, a mask is required upon entering the hospital/clinic. If you do not have a mask, one will be given to you upon arrival. For doctor visits, patients may have 1 support person aged 83 or older with them. For treatment visits, patients cannot have anyone with them due to current Covid guidelines and our immunocompromised population.   Nanoparticle Albumin-Bound Paclitaxel injection What is this medication? NANOPARTICLE ALBUMIN-BOUND PACLITAXEL (Na no PAHR ti kuhl  al BYOO muhn-bound  PAK li TAX el) is a chemotherapy drug. It targets fast dividing cells, like cancer cells, and causes these cells to die. This medicine is used to treat advanced breast cancer, lung  cancer, and pancreatic cancer. This medicine may be used for other purposes; ask your health care provider or pharmacist if you have questions. COMMON BRAND NAME(S): Abraxane What should I tell my care team before I take this medication? They need to know if you have any of these  conditions: kidney disease liver disease low blood counts, like low white cell, platelet, or red cell counts lung or breathing disease, like asthma tingling of the fingers or toes, or other nerve disorder an unusual or allergic reaction to paclitaxel, albumin, other chemotherapy, other medicines, foods, dyes, or preservatives pregnant or trying to get pregnant breast-feeding How should I use this medication? This drug is given as an infusion into a vein. It is administered in a hospital or clinic by a specially trained health care professional. Talk to your pediatrician regarding the use of this medicine in children. Special care may be needed. Overdosage: If you think you have taken too much of this medicine contact a poison control center or emergency room at once. NOTE: This medicine is only for you. Do not share this medicine with others. What if I miss a dose? It is important not to miss your dose. Call your doctor or health care professional if you are unable to keep an appointment. What may interact with this medication? This medicine may interact with the following medications: antiviral medicines for hepatitis, HIV or AIDS certain antibiotics like erythromycin and clarithromycin certain medicines for fungal infections like ketoconazole and itraconazole certain medicines for seizures like carbamazepine, phenobarbital, phenytoin gemfibrozil nefazodone rifampin [place] wort This list may not describe all possible interactions. Give your health care provider a list of all the medicines, herbs, non-prescription drugs, or dietary supplements you use. Also tell them if you smoke, drink alcohol, or use illegal drugs. Some items may interact with your medicine. What should I watch for while using this medication? Your condition will be monitored carefully while you are receiving this medicine. You will need important blood work done while you are taking this medicine. This medicine  can cause serious allergic reactions. If you experience allergic reactions like skin rash, itching or hives, swelling of the face, lips, or tongue, tell your doctor or health care professional right away. In some cases, you may be given additional medicines to help with side effects. Follow all directions for their use. This drug may make you feel generally unwell. This is not uncommon, as chemotherapy can affect healthy cells as well as cancer cells. Report any side effects. Continue your course of treatment even though you feel ill unless your doctor tells you to stop. Call your doctor or health care professional for advice if you get a fever, chills or sore throat, or other symptoms of a cold or flu. Do not treat yourself. This drug decreases your body's ability to fight infections. Try to avoid being around people who are sick. This medicine may increase your risk to bruise or bleed. Call your doctor or health care professional if you notice any unusual bleeding. Be careful brushing and flossing your teeth or using a toothpick because you may get an infection or bleed more easily. If you have any dental work done, tell your dentist you are receiving this medicine. Avoid taking products that contain aspirin, acetaminophen, ibuprofen, naproxen, or ketoprofen unless instructed by your doctor. These medicines may hide a fever. Do not become pregnant while taking this medicine or for 6 months after stopping it. Women should inform their doctor if they wish  to become pregnant or think they might be pregnant. Men should not father a child while taking this medicine or for 3 months after stopping it. There is a potential for serious side effects to an unborn child. Talk to your health care professional or pharmacist for more information. Do not breast-feed an infant while taking this medicine or for 2 weeks after stopping it. This medicine may interfere with the ability to get pregnant or to father a child. You  should talk to your doctor or health care professional if you are concerned about your fertility. What side effects may I notice from receiving this medication? Side effects that you should report to your doctor or health care professional as soon as possible: allergic reactions like skin rash, itching or hives, swelling of the face, lips, or tongue breathing problems changes in vision fast, irregular heartbeat low blood pressure mouth sores pain, tingling, numbness in the hands or feet signs of decreased platelets or bleeding - bruising, pinpoint red spots on the skin, black, tarry stools, blood in the urine signs of decreased red blood cells - unusually weak or tired, feeling faint or lightheaded, falls signs of infection - fever or chills, cough, sore throat, pain or difficulty passing urine signs and symptoms of liver injury like dark yellow or brown urine; general ill feeling or flu-like symptoms; light-colored stools; loss of appetite; nausea; right upper belly pain; unusually weak or tired; yellowing of the eyes or skin swelling of the ankles, feet, hands unusually slow heartbeat Side effects that usually do not require medical attention (report to your doctor or health care professional if they continue or are bothersome): diarrhea hair loss loss of appetite nausea, vomiting tiredness This list may not describe all possible side effects. Call your doctor for medical advice about side effects. You may report side effects to FDA at 1-800-FDA-1088. Where should I keep my medication? This drug is given in a hospital or clinic and will not be stored at home. NOTE: This sheet is a summary. It may not cover all possible information. If you have questions about this medicine, talk to your doctor, pharmacist, or health care provider.  2022 Elsevier/Gold Standard (2017-02-07 00:00:00)  Gemcitabine injection What is this medication? GEMCITABINE (jem SYE ta been) is a chemotherapy drug.  This medicine is used to treat many types of cancer like breast cancer, lung cancer, pancreatic cancer, and ovarian cancer. This medicine may be used for other purposes; ask your health care provider or pharmacist if you have questions. COMMON BRAND NAME(S): Gemzar, Infugem What should I tell my care team before I take this medication? They need to know if you have any of these conditions: blood disorders infection kidney disease liver disease lung or breathing disease, like asthma recent or ongoing radiation therapy an unusual or allergic reaction to gemcitabine, other chemotherapy, other medicines, foods, dyes, or preservatives pregnant or trying to get pregnant breast-feeding How should I use this medication? This drug is given as an infusion into a vein. It is administered in a hospital or clinic by a specially trained health care professional. Talk to your pediatrician regarding the use of this medicine in children. Special care may be needed. Overdosage: If you think you have taken too much of this medicine contact a poison control center or emergency room at once. NOTE: This medicine is only for you. Do not share this medicine with others. What if I miss a dose? It is important not to miss your dose. Call your  doctor or health care professional if you are unable to keep an appointment. What may interact with this medication? medicines to increase blood counts like filgrastim, pegfilgrastim, sargramostim some other chemotherapy drugs like cisplatin vaccines Talk to your doctor or health care professional before taking any of these medicines: acetaminophen aspirin ibuprofen ketoprofen naproxen This list may not describe all possible interactions. Give your health care provider a list of all the medicines, herbs, non-prescription drugs, or dietary supplements you use. Also tell them if you smoke, drink alcohol, or use illegal drugs. Some items may interact with your medicine. What  should I watch for while using this medication? Visit your doctor for checks on your progress. This drug may make you feel generally unwell. This is not uncommon, as chemotherapy can affect healthy cells as well as cancer cells. Report any side effects. Continue your course of treatment even though you feel ill unless your doctor tells you to stop. In some cases, you may be given additional medicines to help with side effects. Follow all directions for their use. Call your doctor or health care professional for advice if you get a fever, chills or sore throat, or other symptoms of a cold or flu. Do not treat yourself. This drug decreases your body's ability to fight infections. Try to avoid being around people who are sick. This medicine may increase your risk to bruise or bleed. Call your doctor or health care professional if you notice any unusual bleeding. Be careful brushing and flossing your teeth or using a toothpick because you may get an infection or bleed more easily. If you have any dental work done, tell your dentist you are receiving this medicine. Avoid taking products that contain aspirin, acetaminophen, ibuprofen, naproxen, or ketoprofen unless instructed by your doctor. These medicines may hide a fever. Do not become pregnant while taking this medicine or for 6 months after stopping it. Women should inform their doctor if they wish to become pregnant or think they might be pregnant. Men should not father a child while taking this medicine and for 3 months after stopping it. There is a potential for serious side effects to an unborn child. Talk to your health care professional or pharmacist for more information. Do not breast-feed an infant while taking this medicine or for at least 1 week after stopping it. Men should inform their doctors if they wish to father a child. This medicine may lower sperm counts. Talk with your doctor or health care professional if you are concerned about your  fertility. What side effects may I notice from receiving this medication? Side effects that you should report to your doctor or health care professional as soon as possible: allergic reactions like skin rash, itching or hives, swelling of the face, lips, or tongue breathing problems pain, redness, or irritation at site where injected signs and symptoms of a dangerous change in heartbeat or heart rhythm like chest pain; dizziness; fast or irregular heartbeat; palpitations; feeling faint or lightheaded, falls; breathing problems signs of decreased platelets or bleeding - bruising, pinpoint red spots on the skin, black, tarry stools, blood in the urine signs of decreased red blood cells - unusually weak or tired, feeling faint or lightheaded, falls signs of infection - fever or chills, cough, sore throat, pain or difficulty passing urine signs and symptoms of kidney injury like trouble passing urine or change in the amount of urine signs and symptoms of liver injury like dark yellow or brown urine; general ill feeling or  flu-like symptoms; light-colored stools; loss of appetite; nausea; right upper belly pain; unusually weak or tired; yellowing of the eyes or skin swelling of ankles, feet, hands Side effects that usually do not require medical attention (report to your doctor or health care professional if they continue or are bothersome): constipation diarrhea hair loss loss of appetite nausea rash vomiting This list may not describe all possible side effects. Call your doctor for medical advice about side effects. You may report side effects to FDA at 1-800-FDA-1088. Where should I keep my medication? This drug is given in a hospital or clinic and will not be stored at home. NOTE: This sheet is a summary. It may not cover all possible information. If you have questions about this medicine, talk to your doctor, pharmacist, or health care provider.  2022 Elsevier/Gold Standard (2017-08-30  00:00:00)

## 2021-06-22 NOTE — Progress Notes (Signed)
Nutrition Follow-up: ° ° ° °Patient with pancreatic cancer. S/p surgery at [facility] (distal pancreatectomy).  Patient receiving gemcitabine/abraxane.   ° °Met with patient during infusion.  Patient reports that he is drinking boost VHC 1 time per day.  Wife found it in local [facility].  Patient does not like to order things via internet.  Says that he is taking 1 tablet of creon with meals, none with snacks.  Says that he is having loose stools and bottom is raw (using cream).  Ate cereal (high protein) with banana and egg this am. Sometimes has oatmeal.  Usually eats 1 larger meal at either lunch or dinner.  ° ° °Medications: reviewed ° °Labs: reviewed ° °Anthropometrics:  ° °Weight 184 lb 3.2 oz today ° °179 lb 11.2 oz on 11/22 °178 lb 3.2 oz on 10/26 °176 lb 3.2 oz on 10/10 °184 lb on 8/16 °180 lb on 8/1 ° °NUTRITION DIAGNOSIS: Unintentional weight loss improving ° ° °INTERVENTION:  °Discussed may need to increase creon dose to help with loose, frequent stool. °Continue boost VHC daily with weight gain °Patient has RD contact information and can reach out at anytime. °  ° °MONITORING, EVALUATION, GOAL: weight trends, intake ° ° °NEXT VISIT: as needed ° °Joli B. Allen, RD, LDN °Registered Dietitian °[phone] (mobile) ° ° °

## 2021-06-23 ENCOUNTER — Other Ambulatory Visit: Payer: Self-pay | Admitting: Internal Medicine

## 2021-06-23 MED ORDER — TEMAZEPAM 30 MG PO CAPS: 30.0000 mg | ORAL_CAPSULE | Freq: Every evening | ORAL | 2 refills | Status: DC | PRN

## 2021-06-23 NOTE — Telephone Encounter (Signed)
Spoke with pt and he stated that he would like to try the Temazepam 30 mg because the 22.5 mg didn't help him any ways. Pt also stated that he would not be able to pick up the medication today so he would like to know if he would be able take the trazadone he has left over for tonight.

## 2021-06-23 NOTE — Telephone Encounter (Signed)
Pt called in regards to medication. Pts is having an issue getting this medication due to price. Pt states the 15mg  and the 30mg  are only $15 copays. The 22.5mg  that is prescribed for pt is a $71 copay. Pt is unable to pay this and pt is needing a new prescription sent over because they were unable to fill prescription. Pt is completely out of medication.

## 2021-06-28 NOTE — Telephone Encounter (Signed)
Patien tis wanting to check with  oncology prior to receiving Prolia. OKay to schedule.

## 2021-06-29 ENCOUNTER — Encounter: Payer: Self-pay | Admitting: Oncology

## 2021-06-30 ENCOUNTER — Encounter: Payer: Self-pay | Admitting: Oncology

## 2021-07-06 ENCOUNTER — Inpatient Hospital Stay (HOSPITAL_BASED_OUTPATIENT_CLINIC_OR_DEPARTMENT_OTHER): Payer: PPO | Admitting: Oncology

## 2021-07-06 ENCOUNTER — Other Ambulatory Visit: Payer: Self-pay

## 2021-07-06 ENCOUNTER — Other Ambulatory Visit: Payer: Self-pay | Admitting: *Deleted

## 2021-07-06 ENCOUNTER — Inpatient Hospital Stay: Payer: PPO

## 2021-07-06 ENCOUNTER — Encounter: Payer: Self-pay | Admitting: Oncology

## 2021-07-06 VITALS — BP 112/78 | HR 87 | Temp 97.6°F | Resp 17 | Wt 183.0 lb

## 2021-07-06 DIAGNOSIS — C251 Malignant neoplasm of body of pancreas: Secondary | ICD-10-CM | POA: Diagnosis not present

## 2021-07-06 DIAGNOSIS — Z5111 Encounter for antineoplastic chemotherapy: Secondary | ICD-10-CM | POA: Diagnosis not present

## 2021-07-06 DIAGNOSIS — R350 Frequency of micturition: Secondary | ICD-10-CM

## 2021-07-06 LAB — CBC WITH DIFFERENTIAL/PLATELET
Abs Immature Granulocytes: 0.07 10*3/uL (ref 0.00–0.07)
Basophils Absolute: 0.1 10*3/uL (ref 0.0–0.1)
Basophils Relative: 1 %
Eosinophils Absolute: 0.1 10*3/uL (ref 0.0–0.5)
Eosinophils Relative: 2 %
HCT: 36.8 % — ABNORMAL LOW (ref 39.0–52.0)
Hemoglobin: 12.6 g/dL — ABNORMAL LOW (ref 13.0–17.0)
Immature Granulocytes: 1 %
Lymphocytes Relative: 25 %
Lymphs Abs: 2.1 10*3/uL (ref 0.7–4.0)
MCH: 32 pg (ref 26.0–34.0)
MCHC: 34.2 g/dL (ref 30.0–36.0)
MCV: 93.4 fL (ref 80.0–100.0)
Monocytes Absolute: 1.8 10*3/uL — ABNORMAL HIGH (ref 0.1–1.0)
Monocytes Relative: 21 %
Neutro Abs: 4.2 10*3/uL (ref 1.7–7.7)
Neutrophils Relative %: 50 %
Platelets: 206 10*3/uL (ref 150–400)
RBC: 3.94 MIL/uL — ABNORMAL LOW (ref 4.22–5.81)
RDW: 17.6 % — ABNORMAL HIGH (ref 11.5–15.5)
WBC: 8.4 10*3/uL (ref 4.0–10.5)
nRBC: 0 % (ref 0.0–0.2)

## 2021-07-06 LAB — COMPREHENSIVE METABOLIC PANEL
ALT: 41 U/L (ref 0–44)
AST: 44 U/L — ABNORMAL HIGH (ref 15–41)
Albumin: 3.8 g/dL (ref 3.5–5.0)
Alkaline Phosphatase: 104 U/L (ref 38–126)
Anion gap: 7 (ref 5–15)
BUN: 25 mg/dL — ABNORMAL HIGH (ref 8–23)
CO2: 28 mmol/L (ref 22–32)
Calcium: 9.2 mg/dL (ref 8.9–10.3)
Chloride: 101 mmol/L (ref 98–111)
Creatinine, Ser: 0.8 mg/dL (ref 0.61–1.24)
GFR, Estimated: >60 mL/min (ref >60)
Glucose, Bld: 171 mg/dL — ABNORMAL HIGH (ref 70–99)
Potassium: 3.9 mmol/L (ref 3.5–5.1)
Sodium: 136 mmol/L (ref 135–145)
Total Bilirubin: 0.7 mg/dL (ref 0.3–1.2)
Total Protein: 6.7 g/dL (ref 6.5–8.1)

## 2021-07-06 LAB — URINALYSIS, COMPLETE (UACMP) WITH MICROSCOPIC
Bacteria, UA: NONE SEEN
Bilirubin Urine: NEGATIVE
Glucose, UA: NEGATIVE mg/dL
Hgb urine dipstick: NEGATIVE
Ketones, ur: NEGATIVE mg/dL
Leukocytes,Ua: NEGATIVE
Nitrite: NEGATIVE
Protein, ur: NEGATIVE mg/dL
Specific Gravity, Urine: 1.017 (ref 1.005–1.030)
Squamous Epithelial / HPF: NONE SEEN (ref 0–5)
pH: 5 (ref 5.0–8.0)

## 2021-07-06 MED ORDER — PACLITAXEL PROTEIN-BOUND CHEMO INJECTION 100 MG
80.0000 mg/m2 | Freq: Once | INTRAVENOUS | Status: AC
  Administered 2021-07-06: 175 mg via INTRAVENOUS
  Filled 2021-07-06: qty 35

## 2021-07-06 MED ORDER — TAMSULOSIN HCL 0.4 MG PO CAPS: ORAL_CAPSULE | ORAL | 3 refills | Status: DC

## 2021-07-06 MED ORDER — HEPARIN SOD (PORK) LOCK FLUSH 100 UNIT/ML IV SOLN
INTRAVENOUS | Status: AC
  Filled 2021-07-06: qty 5

## 2021-07-06 MED ORDER — SODIUM CHLORIDE 0.9 % IV SOLN
700.0000 mg/m2 | Freq: Once | INTRAVENOUS | Status: AC
  Administered 2021-07-06: 1520 mg via INTRAVENOUS
  Filled 2021-07-06: qty 15.78

## 2021-07-06 MED ORDER — SODIUM CHLORIDE 0.9 % IV SOLN
Freq: Once | INTRAVENOUS | Status: AC
  Filled 2021-07-06: qty 250

## 2021-07-06 MED ORDER — PROCHLORPERAZINE MALEATE 10 MG PO TABS
10.0000 mg | ORAL_TABLET | Freq: Once | ORAL | Status: AC
  Administered 2021-07-06: 10 mg via ORAL
  Filled 2021-07-06: qty 1

## 2021-07-06 NOTE — Progress Notes (Signed)
Appetite is 75% normal. Normal BM's except a day or 2 after chemo he has some mild constipation. Denies dizziness. Denies any pain. Anticipatory nausea only on day of chemo and he takes a nausea pill prior to coming to clinic.

## 2021-07-06 NOTE — Progress Notes (Signed)
error 

## 2021-07-06 NOTE — Progress Notes (Signed)
Hematology/Oncology Consult note [facility]  Telephone:([phone] Fax:([phone]  Patient Care Team: Crecencio Mc, MD as PCP - General (Internal Medicine) Clent Jacks, RN as Oncology Nurse Navigator   Name of the patient: George Owens  [id number]  Nov 14, 1931   Date of visit: 07/06/21  Diagnosis- pancreatic adenocarcinoma stage I BC T2N 0M0  Chief complaint/ Reason for visit-on treatment assessment prior to cycle 4-day 1 of gemcitabine Abraxane chemotherapy  Heme/Onc history: patient is a 88 year old male with a past medical history significant for GERD, hyperlipidemia, cirrhosis.  He was having symptoms of Abdominal pain and reflux and therefore underwent a CT abdomen and pelvis with contrast on 09/11/2020 which showed a hypointense ill-defined mass in the pancreatic body up to 25 mm.  No extrapancreatic infiltrative density about the aorta or proximal [name].  No local regional adenopathy or distant metastatic disease.  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  This was followed by an EUS at [facility] which showed a hypoechoic mass measuring 1.8 x 1.6 cm in the pancreatic body.  Endosonographic borders poorly defined.  Upstream pancreatic duct dilatation.  No abnormal appearing lymph nodes.  Endosonographic imaging of the liver showed no lesion.  Biopsy showed adenocarcinoma.   PET scan showed 2.7 x 2 cm ovoid mass in the body of the pancreas with an SUV of 7.1.  18 mm hypermetabolic nodule in the isthmus of the thyroid gland.  No evidence of local regional adenopathy or distant metastatic disease.   Patient was evaluated by Dr. Hyman Hopes at [facility] for consideration of surgery and has been deemed to be upfront resectable surgical candidate.  However plan was to offer him neoadjuvant chemotherapy for 3 months to control micrometastatic disease before proceeding with definitive surgery.  If ultimately patient decides not to proceed with surgery then radiation at  [facility] also remains an option.   Patient received 8 doses of gemcitabine and Abraxane chemotherapy 1 week on 1 week off due to neutropenia and thrombocytopenia.  Plan is to proceed with definitive surgery at this time as patient was also getting recurrent UTIs on chemotherapy.  Depending on his recovery after surgery further adjuvant chemotherapy can be considered   Patient underwent laparoscopy surgery by Dr. Hyman Hopes at [facility].  Final pathology showed 3 cm grade 3 poorly differentiated adenocarcinoma of the pancreas with negative margins.  15 lymph nodes negative for malignancy.  Absent chemotherapy response with extensive residual cancer and no evidence of tumor regression.  Perineural invasion identified lymphovascular invasion not identified.   Plan is to proceed with 4 cycles of adjuvant gemcitabine Abraxane 1 week on 1 week off if tolerated    Interval history-tolerating chemotherapy well so far.  Denies any specific complaints at this time  ECOG PS- 1 Pain scale- 0  Review of systems- Review of Systems  Constitutional:  Positive for malaise/fatigue. Negative for chills, fever and weight loss.  HENT:  Negative for congestion, ear discharge and nosebleeds.   Eyes:  Negative for blurred vision.  Respiratory:  Negative for cough, hemoptysis, sputum production, shortness of breath and wheezing.   Cardiovascular:  Negative for chest pain, palpitations, orthopnea and claudication.  Gastrointestinal:  Negative for abdominal pain, blood in stool, constipation, diarrhea, heartburn, melena, nausea and vomiting.  Genitourinary:  Negative for dysuria, flank pain, frequency, hematuria and urgency.  Musculoskeletal:  Negative for back pain, joint pain and myalgias.  Skin:  Negative for rash.  Neurological:  Negative for dizziness, tingling, focal weakness, seizures,  weakness and headaches.  Endo/Heme/Allergies:  Does not bruise/bleed easily.  Psychiatric/Behavioral:  Negative for depression and  suicidal ideas. The patient does not have insomnia.      Allergies  Allergen Reactions   Kenalog [Triamcinolone Acetonide]     Blindness X 3 Days injection     Past Medical History:  Diagnosis Date   Allergy    Aneurysm of abdominal aorta    Basal cell carcinoma    right temple Dr. 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     Past Surgical History:  Procedure Laterality Date   CATARACT EXTRACTION Right    CATARACT EXTRACTION W/PHACO Left 12/26/2017   Procedure: CATARACT EXTRACTION PHACO AND INTRAOCULAR LENS PLACEMENT ([place])  COMPLICATED LEFT;  Surgeon: Leandrew Koyanagi, MD;  Location: [place];  Service: Ophthalmology;  Laterality: Left;  [place]   colonoscopy     ESOPHAGOGASTRODUODENOSCOPY (EGD) WITH PROPOFOL     EYE MUSCLE SURGERY Left    PORTA CATH INSERTION N/A 10/19/2020   Procedure: PORTA CATH INSERTION;  Surgeon: Algernon Huxley, MD;  Location: [place] CV [facility];  Service: Cardiovascular;  Laterality: N/A;   TONSILLECTOMY AND ADENOIDECTOMY      Social History   Socioeconomic History   Marital status: Married    Spouse name: Eloise   Number of children: 4   Years of education: Not on file   Highest education level: Not on file  Occupational History   Occupation: retired  Tobacco Use   Smoking status: Never   Smokeless tobacco: Never  Vaping Use   Vaping Use: Never used  Substance and Sexual Activity   Alcohol use: No   Drug use: No   Sexual activity: Yes    Partners: Female  Other Topics Concern   Not on file  Social History Narrative   Lives with wife in a 5 story retirement home.  He lives on the first floor.  4 year college degree.  Retired  Tax adviser.  Married 4 years to his first wife's sister.     Social Determinants of Health   Financial Resource Strain: Not on file  Food Insecurity: Not on file  Transportation Needs: Not on file  Physical Activity: Not on file  Stress: Not on file  Social Connections: Not on file  Intimate Partner Violence: Not on file    Family History  Problem Relation Age of Onset   Stroke Mother    Arthritis Father    Heart disease Father    Brain cancer Brother        1/2 brother (possibly related to mother NOT related to patient)   Colon cancer Neg Hx    Esophageal cancer Neg Hx    Pancreatic cancer Neg Hx    Stomach cancer Neg Hx    Liver disease Neg Hx      Current Outpatient Medications:    acetaminophen (TYLENOL) 325 MG tablet, Take 650 mg by mouth every 6 (six) hours as needed., Disp: , Rfl:    Calcium Carb-Cholecalciferol (CALCIUM 1000 + D PO), Take 1 tablet by mouth daily. , Disp: , Rfl:    Cholecalciferol 50 MCG (  2000 UT) CAPS, Take 1 capsule by mouth daily., Disp: , Rfl:    Cranberry 1000 MG CAPS, Take 2 tablets by mouth daily., Disp: , Rfl:    CREON 36000-114000 units CPEP capsule, Take 36,000 Units by mouth. 1 pills with meal, o with  pill with snack, Disp: , Rfl:    Denosumab (PROLIA [place]), Inject into the skin every 6 (six) months., Disp: , Rfl:    glucosamine-chondroitin 500-400 MG tablet, Take 1 tablet by mouth daily., Disp: , Rfl:    hydrocortisone (ANUSOL-HC) 2.5 % rectal cream, Place 1 application rectally 2 (two) times daily., Disp: 30 g, Rfl: 0   lidocaine-prilocaine (EMLA) cream, Apply to affected area once, Disp: 30 g, Rfl: 3   loratadine (CLARITIN) 10 MG tablet, Take 10 mg by mouth daily as needed., Disp: , Rfl:    metoprolol succinate (TOPROL-XL) 50 MG 24 hr tablet, Take 1.5 tablets (75 mg total) by mouth daily. (Patient taking differently: Take 50 mg by mouth daily.), Disp: 30 tablet, Rfl: 1   omeprazole (PRILOSEC) 40 MG capsule, Take 40 mg by mouth daily.,  Disp: , Rfl:    tamsulosin (FLOMAX) 0.4 MG CAPS capsule, TAKE 1 CAPSULE BY MOUTH EVERY DAY AFTER SUPPER, Disp: 30 capsule, Rfl: 0   temazepam (RESTORIL) 30 MG capsule, Take 1 capsule (30 mg total) by mouth at bedtime as needed for sleep., Disp: 30 capsule, Rfl: 2   vitamin B-12 (CYANOCOBALAMIN) 1000 MCG tablet, Take 1,000 mcg by mouth in the morning and at bedtime., Disp: , Rfl:    ALPRAZolam (XANAX) 0.25 MG tablet, 0.25 mg at bedtime as needed. (Patient not taking: Reported on 07/06/2021), Disp: , Rfl:    famotidine (PEPCID) 10 MG tablet, Take 10 mg by mouth. prn (Patient not taking: Reported on 07/06/2021), Disp: , Rfl:    Ranibizumab (LUCENTIS IO), Inject into the eye as needed. As needed every three months., Disp: , Rfl:  No current facility-administered medications for this visit.  Facility-Administered Medications Ordered in Other Visits:    heparin lock flush 100 UNIT/ML injection, , , ,   Physical exam:  Vitals:   07/06/21 0923  BP: 112/78  Pulse: 87  Resp: 17  Temp: 97.6 F (36.4 C)  TempSrc: Tympanic  SpO2: 100%  Weight: 183 lb (83 kg)   Physical Exam Constitutional:      General: He is not in acute distress. Cardiovascular:     Rate and Rhythm: Normal rate and regular rhythm.     Heart sounds: Normal heart sounds.  Pulmonary:     Effort: Pulmonary effort is normal.     Breath sounds: Normal breath sounds.  Abdominal:     General: Bowel sounds are normal.     Palpations: Abdomen is soft.  Skin:    General: Skin is warm and dry.  Neurological:     Mental Status: He is alert and oriented to person, place, and time.     CMP Latest Ref Rng & Units 07/06/2021  Glucose 70 - 99 mg/dL 171(H)  BUN 8 - 23 mg/dL 25(H)  Creatinine 0.61 - 1.24 mg/dL 0.80  Sodium 135 - 145 mmol/L 136  Potassium 3.5 - 5.1 mmol/L 3.9  Chloride 98 - 111 mmol/L 101  CO2 22 - 32 mmol/L 28  Calcium 8.9 - 10.3 mg/dL 9.2  Total Protein 6.5 - 8.1 g/dL 6.7  Total Bilirubin 0.3 - 1.2 mg/dL 0.7   Alkaline Phos 38 - 126 U/L 104  AST 15 - 41 U/L 44(H)  ALT 0 - 44 U/L 41   CBC Latest Ref Rng & Units 07/06/2021  WBC 4.0 - 10.5 K/uL 8.4  Hemoglobin 13.0 - 17.0 g/dL 12.6(L)  Hematocrit 39.0 - 52.0 % 36.8(L)  Platelets 150 - 400 K/uL 206    Assessment and plan- Patient is a 88 y.o. male stage Ib pancreatic adenocarcinoma s/p 3 months of neoadjuvant gemcitabine Abraxane chemotherapy followed by roboticAssisted laparoscopy and distal pancreatectomy.  He is here for on treatment assessment prior to cycle 4-day 1 of gemcitabine Abraxane chemotherapy  Counts okay to proceed with cycle 4-day 1 of gemcitabine Abraxane chemotherapy today and I will see him back in 2 weeks for cycle 4-day 15.  Normal on pro Neulasta today.  We will consider growth factor support for the last cycle.  Plan to repeat CT chest abdomen pelvis with contrast after 2 weeks   Visit Diagnosis 1. Encounter for antineoplastic chemotherapy   2. Malignant neoplasm of body of pancreas ([place])      Dr. Randa Evens, MD, MPH [facility] at [facility] [phone] 07/06/2021 1:38 PM

## 2021-07-16 ENCOUNTER — Telehealth: Payer: Self-pay | Admitting: *Deleted

## 2021-07-16 NOTE — Telephone Encounter (Signed)
The pt. Left me a message about feet to ankle swelling, for a few days, got the nurse at [facility] to look at this and she said it was mild. He want Dr. Carolyn Stare to know of this. Dr. Janese Banks states that it is mild to continue to monitor it. If 1 foot gets bigger than the other side then he should go to urgent care and if it continues to be bilateral and mild,you can elevated the legs and will see him next week on his scheduled appt. If any problems in between can call after hours to main office to the person on call

## 2021-07-19 ENCOUNTER — Other Ambulatory Visit: Payer: Self-pay | Admitting: *Deleted

## 2021-07-19 DIAGNOSIS — C251 Malignant neoplasm of body of pancreas: Secondary | ICD-10-CM

## 2021-07-20 ENCOUNTER — Encounter: Payer: Self-pay | Admitting: Oncology

## 2021-07-20 ENCOUNTER — Inpatient Hospital Stay (HOSPITAL_BASED_OUTPATIENT_CLINIC_OR_DEPARTMENT_OTHER): Payer: PPO | Admitting: Oncology

## 2021-07-20 ENCOUNTER — Inpatient Hospital Stay: Payer: PPO

## 2021-07-20 ENCOUNTER — Other Ambulatory Visit: Payer: Self-pay

## 2021-07-20 VITALS — BP 112/67 | HR 74 | Temp 97.1°F | Resp 18 | Ht 76.0 in | Wt 183.5 lb

## 2021-07-20 DIAGNOSIS — Z5111 Encounter for antineoplastic chemotherapy: Secondary | ICD-10-CM

## 2021-07-20 DIAGNOSIS — C251 Malignant neoplasm of body of pancreas: Secondary | ICD-10-CM

## 2021-07-20 LAB — COMPREHENSIVE METABOLIC PANEL
ALT: 39 U/L (ref 0–44)
AST: 45 U/L — ABNORMAL HIGH (ref 15–41)
Albumin: 3.8 g/dL (ref 3.5–5.0)
Alkaline Phosphatase: 89 U/L (ref 38–126)
Anion gap: 8 (ref 5–15)
BUN: 22 mg/dL (ref 8–23)
CO2: 29 mmol/L (ref 22–32)
Calcium: 9.7 mg/dL (ref 8.9–10.3)
Chloride: 101 mmol/L (ref 98–111)
Creatinine, Ser: 0.88 mg/dL (ref 0.61–1.24)
GFR, Estimated: >60 mL/min (ref >60)
Glucose, Bld: 169 mg/dL — ABNORMAL HIGH (ref 70–99)
Potassium: 3.6 mmol/L (ref 3.5–5.1)
Sodium: 138 mmol/L (ref 135–145)
Total Bilirubin: 0.7 mg/dL (ref 0.3–1.2)
Total Protein: 6.9 g/dL (ref 6.5–8.1)

## 2021-07-20 LAB — CBC WITH DIFFERENTIAL/PLATELET
Abs Immature Granulocytes: 0.01 10*3/uL (ref 0.00–0.07)
Basophils Absolute: 0 10*3/uL (ref 0.0–0.1)
Basophils Relative: 1 %
Eosinophils Absolute: 0.1 10*3/uL (ref 0.0–0.5)
Eosinophils Relative: 3 %
HCT: 36.7 % — ABNORMAL LOW (ref 39.0–52.0)
Hemoglobin: 12.5 g/dL — ABNORMAL LOW (ref 13.0–17.0)
Immature Granulocytes: 0 %
Lymphocytes Relative: 35 %
Lymphs Abs: 1.3 10*3/uL (ref 0.7–4.0)
MCH: 32.4 pg (ref 26.0–34.0)
MCHC: 34.1 g/dL (ref 30.0–36.0)
MCV: 95.1 fL (ref 80.0–100.0)
Monocytes Absolute: 1 10*3/uL (ref 0.1–1.0)
Monocytes Relative: 25 %
Neutro Abs: 1.4 10*3/uL — ABNORMAL LOW (ref 1.7–7.7)
Neutrophils Relative %: 36 %
Platelets: 292 10*3/uL (ref 150–400)
RBC: 3.86 MIL/uL — ABNORMAL LOW (ref 4.22–5.81)
RDW: 15.7 % — ABNORMAL HIGH (ref 11.5–15.5)
WBC: 3.8 10*3/uL — ABNORMAL LOW (ref 4.0–10.5)
nRBC: 0 % (ref 0.0–0.2)

## 2021-07-20 LAB — TSH: TSH: 5.649 u[IU]/mL — ABNORMAL HIGH (ref 0.350–4.500)

## 2021-07-20 MED ORDER — SODIUM CHLORIDE 0.9 % IV SOLN
700.0000 mg/m2 | Freq: Once | INTRAVENOUS | Status: AC
  Administered 2021-07-20: 1520 mg via INTRAVENOUS
  Filled 2021-07-20: qty 26.3

## 2021-07-20 MED ORDER — PACLITAXEL PROTEIN-BOUND CHEMO INJECTION 100 MG
80.0000 mg/m2 | Freq: Once | INTRAVENOUS | Status: AC
  Administered 2021-07-20: 175 mg via INTRAVENOUS
  Filled 2021-07-20: qty 35

## 2021-07-20 MED ORDER — PROCHLORPERAZINE MALEATE 10 MG PO TABS
10.0000 mg | ORAL_TABLET | Freq: Once | ORAL | Status: AC
  Administered 2021-07-20: 10 mg via ORAL
  Filled 2021-07-20: qty 1

## 2021-07-20 MED ORDER — SODIUM CHLORIDE 0.9 % IV SOLN
Freq: Once | INTRAVENOUS | Status: AC
  Filled 2021-07-20: qty 250

## 2021-07-20 MED ORDER — FAMOTIDINE IN NACL 20-0.9 MG/50ML-% IV SOLN
20.0000 mg | Freq: Once | INTRAVENOUS | Status: AC
  Administered 2021-07-20: 20 mg via INTRAVENOUS
  Filled 2021-07-20: qty 50

## 2021-07-20 MED ORDER — HEPARIN SOD (PORK) LOCK FLUSH 100 UNIT/ML IV SOLN
INTRAVENOUS | Status: AC
  Administered 2021-07-20: 500 [IU]
  Filled 2021-07-20: qty 5

## 2021-07-20 MED ORDER — HEPARIN SOD (PORK) LOCK FLUSH 100 UNIT/ML IV SOLN
500.0000 [IU] | Freq: Once | INTRAVENOUS | Status: AC | PRN
  Filled 2021-07-20: qty 5

## 2021-07-20 MED ORDER — PEGFILGRASTIM 6 MG/0.6ML ~~LOC~~ PSKT
6.0000 mg | PREFILLED_SYRINGE | Freq: Once | SUBCUTANEOUS | Status: AC
  Administered 2021-07-20: 6 mg via SUBCUTANEOUS
  Filled 2021-07-20: qty 0.6

## 2021-07-20 NOTE — Progress Notes (Signed)
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  He was having symptoms of Abdominal pain and reflux and therefore underwent a CT abdomen and pelvis with contrast on 09/11/2020 which showed a hypointense ill-defined mass in the pancreatic body up to 25 mm.  No extrapancreatic infiltrative density about the aorta or proximal [name].  No local regional adenopathy or distant metastatic disease.  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  This was followed by an EUS at [facility] which showed a hypoechoic mass measuring 1.8 x 1.6 cm in the pancreatic body.  Endosonographic borders poorly defined.  Upstream pancreatic duct dilatation.  No abnormal appearing lymph nodes.  Endosonographic imaging of the liver showed no lesion.  Biopsy showed adenocarcinoma.   PET scan showed 2.7 x 2 cm ovoid mass in the body of the pancreas with an SUV of 7.1.  18 mm hypermetabolic nodule in the isthmus of the thyroid gland.  No evidence of local regional adenopathy or distant metastatic disease.   Patient was evaluated by Dr. Hyman Hopes at [facility] for consideration of surgery and has been deemed to be upfront resectable surgical candidate.  However plan was to offer him neoadjuvant chemotherapy for 3 months to control micrometastatic disease before proceeding with definitive surgery.  If ultimately patient decides not to proceed with surgery then radiation at  [facility] also remains an option.   Patient received 8 doses of gemcitabine and Abraxane chemotherapy 1 week on 1 week off due to neutropenia and thrombocytopenia.  Plan is to proceed with definitive surgery at this time as patient was also getting recurrent UTIs on chemotherapy.  Depending on his recovery after surgery further adjuvant chemotherapy can be considered   Patient underwent laparoscopy surgery by Dr. Hyman Hopes at [facility].  Final pathology showed 3 cm grade 3 poorly differentiated adenocarcinoma of the pancreas with negative margins.  15 lymph nodes negative for malignancy.  Absent chemotherapy response with extensive residual cancer and no evidence of tumor regression.  Perineural invasion identified lymphovascular invasion not identified.   Plan is to proceed with 4 cycles of adjuvant gemcitabine Abraxane 1 week on 1 week off if tolerated    Interval history-patient is concerned about bilateral ankle/foot swelling that has developed in the last few days.  Denies any shortness of breath.  He is otherwise tolerating chemotherapy without any significant side effects.  ECOG PS- 1 Pain scale- 0   Review of systems- Review of Systems  Constitutional:  Negative for chills, fever, malaise/fatigue and weight loss.  HENT:  Negative for congestion, ear discharge and nosebleeds.   Eyes:  Negative for blurred vision.  Respiratory:  Negative for cough, hemoptysis, sputum production, shortness of breath and wheezing.   Cardiovascular:  Positive for leg swelling. Negative for chest pain, palpitations, orthopnea and claudication.  Gastrointestinal:  Negative for abdominal pain, blood in stool, constipation, diarrhea, heartburn, melena, nausea and vomiting.  Genitourinary:  Negative for dysuria, flank pain, frequency, hematuria and urgency.  Musculoskeletal:  Negative for  back pain, joint pain and myalgias.  Skin:  Negative for rash.  Neurological:  Negative for dizziness, tingling, focal weakness,  seizures, weakness and headaches.  Endo/Heme/Allergies:  Does not bruise/bleed easily.  Psychiatric/Behavioral:  Negative for depression and suicidal ideas. The patient does not have insomnia.      Allergies  Allergen Reactions   Kenalog [Triamcinolone Acetonide]     Blindness X 3 Days injection     Past Medical History:  Diagnosis Date   Allergy    Aneurysm of abdominal aorta    Basal cell carcinoma    right temple Dr. 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     Past Surgical History:  Procedure Laterality Date   CATARACT EXTRACTION Right    CATARACT EXTRACTION W/PHACO Left 12/26/2017   Procedure: CATARACT EXTRACTION PHACO AND INTRAOCULAR LENS PLACEMENT ([place])  COMPLICATED LEFT;  Surgeon: Leandrew Koyanagi, MD;  Location: [place];  Service: Ophthalmology;  Laterality: Left;  [place]   colonoscopy     ESOPHAGOGASTRODUODENOSCOPY (EGD) WITH PROPOFOL     EYE MUSCLE SURGERY Left    PORTA CATH INSERTION N/A 10/19/2020   Procedure: PORTA CATH INSERTION;  Surgeon: Algernon Huxley, MD;  Location: [place] [facility];  Service: Cardiovascular;  Laterality: N/A;   TONSILLECTOMY AND ADENOIDECTOMY      Social History   Socioeconomic History   Marital status: Married    Spouse name: Eloise   Number of children: 4   Years of education: Not on file   Highest education level: Not on file  Occupational History   Occupation: retired  Tobacco Use   Smoking status: Never   Smokeless tobacco: Never  Vaping Use   Vaping Use: Never used  Substance and Sexual Activity   Alcohol use: No   Drug use: No   Sexual activity: Yes    Partners: Female  Other Topics Concern   Not on file   Social History Narrative   Lives with wife in a 5 story retirement home.  He lives on the first floor.  4 year college degree.  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     Social Determinants of Health   Financial Resource Strain: Not on file  Food Insecurity: Not on file  Transportation Needs: Not on file  Physical Activity: Not on file  Stress: Not on file  Social Connections: Not on file  Intimate Partner Violence: Not on file    Family History  Problem Relation Age of Onset   Stroke Mother    Arthritis Father    Heart disease Father    Brain cancer Brother        1/2 brother (possibly related to mother NOT related to patient)   Colon cancer Neg Hx    Esophageal cancer Neg Hx    Pancreatic cancer Neg Hx    Stomach cancer Neg Hx    Liver disease Neg Hx      Current Outpatient Medications:    acetaminophen (TYLENOL) 325 MG tablet, Take 650 mg by mouth every 6 (six) hours as needed., Disp: , Rfl:    ALPRAZolam (  XANAX) 0.25 MG tablet, 0.25 mg at bedtime as needed. (Patient not taking: Reported on 07/06/2021), Disp: , Rfl:    Calcium Carb-Cholecalciferol (CALCIUM 1000 + D PO), Take 1 tablet by mouth daily. , Disp: , Rfl:    Cholecalciferol 50 MCG (2000 UT) CAPS, Take 1 capsule by mouth daily., Disp: , Rfl:    Cranberry 1000 MG CAPS, Take 2 tablets by mouth daily., Disp: , Rfl:    CREON 36000-114000 units CPEP capsule, Take 36,000 Units by mouth. 1 pills with meal, o with  pill with snack, Disp: , Rfl:    Denosumab (PROLIA [place]), Inject into the skin every 6 (six) months., Disp: , Rfl:    famotidine (PEPCID) 10 MG tablet, Take 10 mg by mouth. prn (Patient not taking: Reported on 07/06/2021), Disp: , Rfl:    glucosamine-chondroitin 500-400 MG tablet, Take 1 tablet by mouth daily., Disp: , Rfl:    hydrocortisone (ANUSOL-HC) 2.5 % rectal cream, Place 1 application rectally 2 (two) times daily., Disp: 30 g, Rfl: 0   lidocaine-prilocaine (EMLA) cream, Apply to  affected area once, Disp: 30 g, Rfl: 3   loratadine (CLARITIN) 10 MG tablet, Take 10 mg by mouth daily as needed., Disp: , Rfl:    metoprolol succinate (TOPROL-XL) 50 MG 24 hr tablet, Take 1.5 tablets (75 mg total) by mouth daily. (Patient taking differently: Take 50 mg by mouth daily.), Disp: 30 tablet, Rfl: 1   omeprazole (PRILOSEC) 40 MG capsule, Take 40 mg by mouth daily., Disp: , Rfl:    Ranibizumab (LUCENTIS IO), Inject into the eye as needed. As needed every three months., Disp: , Rfl:    tamsulosin (FLOMAX) 0.4 MG CAPS capsule, TAKE 1 CAPSULE BY MOUTH EVERY DAY AFTER SUPPER, Disp: 30 capsule, Rfl: 3   temazepam (RESTORIL) 30 MG capsule, Take 1 capsule (30 mg total) by mouth at bedtime as needed for sleep., Disp: 30 capsule, Rfl: 2   vitamin B-12 (CYANOCOBALAMIN) 1000 MCG tablet, Take 1,000 mcg by mouth in the morning and at bedtime., Disp: , Rfl:  No current facility-administered medications for this visit.  Facility-Administered Medications Ordered in Other Visits:    heparin lock flush 100 UNIT/ML injection, , , ,   Physical exam:  Vitals:   07/20/21 0834  BP: 112/67  Pulse: 74  Resp: 18  Temp: (!) 97.1 F (36.2 C)  TempSrc: Tympanic  SpO2: 100%  Weight: 183 lb 8 oz (83.2 kg)  Height: 6\' 4"  (1.93 m)   Physical Exam Cardiovascular:     Rate and Rhythm: Normal rate and regular rhythm.     Heart sounds: Normal heart sounds.  Pulmonary:     Effort: Pulmonary effort is normal.     Breath sounds: Normal breath sounds.  Abdominal:     General: Bowel sounds are normal.     Palpations: Abdomen is soft.  Musculoskeletal:     Comments: Trace bilateral ankle edema  Skin:    General: Skin is warm and dry.  Neurological:     Mental Status: He is alert and oriented to person, place, and time.     CMP Latest Ref Rng & Units 07/20/2021  Glucose 70 - 99 mg/dL 169(H)  BUN 8 - 23 mg/dL 22  Creatinine 0.61 - 1.24 mg/dL 0.88  Sodium 135 - 145 mmol/L 138  Potassium 3.5 - 5.1 mmol/L  3.6  Chloride 98 - 111 mmol/L 101  CO2 22 - 32 mmol/L 29  Calcium 8.9 - 10.3 mg/dL 9.7  Total  Protein 6.5 - 8.1 g/dL 6.9  Total Bilirubin 0.3 - 1.2 mg/dL 0.7  Alkaline Phos 38 - 126 U/L 89  AST 15 - 41 U/L 45(H)  ALT 0 - 44 U/L 39   CBC Latest Ref Rng & Units 07/20/2021  WBC 4.0 - 10.5 K/uL 3.8(L)  Hemoglobin 13.0 - 17.0 g/dL 12.5(L)  Hematocrit 39.0 - 52.0 % 36.7(L)  Platelets 150 - 400 K/uL 292     Assessment and plan- Patient is a 88 y.o. male stage Ib pancreatic adenocarcinoma s/p 3 months of neoadjuvant gemcitabine Abraxane chemotherapy followed by roboticAssisted laparoscopy and distal pancreatectomy.  He is here for on treatment assessment prior to cycle 4-day 15 of gemcitabine Abraxane chemotherapy  Counts okay to proceed with cycle 4-day 15 of gemcitabine Abraxane chemotherapy today with ongoing Neulasta support.  ANC is 1.4-3.8.  Proceed with chemotherapy.  This will be his last cycle.  We will obtain CT chest abdomen and pelvis with contrast in 2 to 3 weeks time.  I will see him after CT scan is back to discuss the results  Bilateral leg edema: TSH is pending from today.   Abraxane can also cause bilateral leg edema.  Clinically patient does not have significant edema which is only trace on today's exam.  Continue to monitor   Visit Diagnosis 1. Encounter for antineoplastic chemotherapy   2. Malignant neoplasm of body of pancreas ([place])      Dr. Randa Evens, MD, MPH [facility] at [facility] [phone] 07/20/2021 8:35 AM

## 2021-07-20 NOTE — Patient Instructions (Signed)
[facility] AT [place]  Discharge Instructions: Thank you for choosing [facility] to provide your oncology and hematology care.  If you have a lab appointment with the [facility], please go directly to the [facility] and check in at the registration area.  Wear comfortable clothing and clothing appropriate for easy access to any Portacath or PICC line.   We strive to give you quality time with your provider. You may need to reschedule your appointment if you arrive late (15 or more minutes).  Arriving late affects you and other patients whose appointments are after yours.  Also, if you miss three or more appointments without notifying the office, you may be dismissed from the clinic at the providers discretion.      For prescription refill requests, have your pharmacy contact our office and allow 72 hours for refills to be completed.    Today you received the following chemotherapy and/or immunotherapy agents Gemzar and Abraxane        To help prevent nausea and vomiting after your treatment, we encourage you to take your nausea medication as directed.  BELOW ARE SYMPTOMS THAT SHOULD BE REPORTED IMMEDIATELY: *FEVER GREATER THAN 100.4 F (38 C) OR HIGHER *CHILLS OR SWEATING *NAUSEA AND VOMITING THAT IS NOT CONTROLLED WITH YOUR NAUSEA MEDICATION *UNUSUAL SHORTNESS OF BREATH *UNUSUAL BRUISING OR BLEEDING *URINARY PROBLEMS (pain or burning when urinating, or frequent urination) *BOWEL PROBLEMS (unusual diarrhea, constipation, pain near the anus) TENDERNESS IN MOUTH AND THROAT WITH OR WITHOUT PRESENCE OF ULCERS (sore throat, sores in mouth, or a toothache) UNUSUAL RASH, SWELLING OR PAIN  UNUSUAL VAGINAL DISCHARGE OR ITCHING   Items with * indicate a potential emergency and should be followed up as soon as possible or go to the Emergency Department if any problems should occur.  Please show the CHEMOTHERAPY ALERT CARD or IMMUNOTHERAPY ALERT CARD at  check-in to the Emergency Department and triage nurse.  Should you have questions after your visit or need to cancel or reschedule your appointment, please contact [facility] [place]  [phone] and follow the prompts.  Office hours are 8:00 a.m. to 4:30 p.m. Monday - Friday. Please note that voicemails left after 4:00 p.m. may not be returned until the following business day.  We are closed weekends and major holidays. You have access to a nurse at all times for urgent questions. Please call the main number to the clinic [phone] and follow the prompts.  For any non-urgent questions, you may also contact your provider using MyChart. We now offer e-Visits for anyone 86 and older to request care online for non-urgent symptoms. For details visit [email or web address].   Also download the MyChart app! Go to the app store, search "MyChart", open the app, select [facility], and log in with your MyChart username and password.  Due to Covid, a mask is required upon entering the hospital/clinic. If you do not have a mask, one will be given to you upon arrival. For doctor visits, patients may have 1 support person aged 86 or older with them. For treatment visits, patients cannot have anyone with them due to current Covid guidelines and our immunocompromised population.

## 2021-07-20 NOTE — Progress Notes (Signed)
0927- ANC: 1400. MD, Dr. Janese Banks, notified and aware. Per MD order: okay to proceed with scheduled Abraxane and Gemzar treatment today; patient to receive Neulasta Onpro today as well.

## 2021-07-21 ENCOUNTER — Encounter: Payer: Self-pay | Admitting: Oncology

## 2021-07-22 ENCOUNTER — Other Ambulatory Visit: Payer: Self-pay | Admitting: *Deleted

## 2021-07-26 DIAGNOSIS — I480 Paroxysmal atrial fibrillation: Secondary | ICD-10-CM | POA: Diagnosis not present

## 2021-07-26 DIAGNOSIS — I7121 Aneurysm of the ascending aorta, without rupture: Secondary | ICD-10-CM | POA: Diagnosis not present

## 2021-07-26 DIAGNOSIS — E785 Hyperlipidemia, unspecified: Secondary | ICD-10-CM | POA: Diagnosis not present

## 2021-07-26 DIAGNOSIS — C251 Malignant neoplasm of body of pancreas: Secondary | ICD-10-CM | POA: Diagnosis not present

## 2021-08-04 ENCOUNTER — Ambulatory Visit
Admission: RE | Admit: 2021-08-04 | Discharge: 2021-08-04 | Disposition: A | Discharge status: Home / Self Care | Payer: PPO | Source: Ambulatory Visit | Attending: Oncology | Admitting: Oncology

## 2021-08-04 ENCOUNTER — Other Ambulatory Visit: Payer: Self-pay

## 2021-08-04 DIAGNOSIS — K5730 Diverticulosis of large intestine without perforation or abscess without bleeding: Secondary | ICD-10-CM | POA: Diagnosis not present

## 2021-08-04 DIAGNOSIS — C251 Malignant neoplasm of body of pancreas: Secondary | ICD-10-CM | POA: Insufficient documentation

## 2021-08-04 DIAGNOSIS — J984 Other disorders of lung: Secondary | ICD-10-CM | POA: Diagnosis not present

## 2021-08-04 MED ORDER — IOHEXOL 300 MG/ML  SOLN
100.0000 mL | Freq: Once | INTRAMUSCULAR | Status: AC | PRN
  Administered 2021-08-04: 100 mL via INTRAVENOUS

## 2021-08-10 ENCOUNTER — Other Ambulatory Visit: Payer: PPO

## 2021-08-10 ENCOUNTER — Ambulatory Visit: Payer: PPO | Admitting: Oncology

## 2021-08-11 ENCOUNTER — Inpatient Hospital Stay: Payer: PPO | Attending: Oncology

## 2021-08-11 ENCOUNTER — Inpatient Hospital Stay: Payer: PPO | Admitting: Oncology

## 2021-08-11 ENCOUNTER — Other Ambulatory Visit: Payer: Self-pay

## 2021-08-11 ENCOUNTER — Encounter: Payer: Self-pay | Admitting: Oncology

## 2021-08-11 VITALS — BP 111/66 | HR 70 | Temp 97.5°F | Resp 18 | Wt 183.0 lb

## 2021-08-11 DIAGNOSIS — I7121 Aneurysm of the ascending aorta, without rupture: Secondary | ICD-10-CM | POA: Insufficient documentation

## 2021-08-11 DIAGNOSIS — K7460 Unspecified cirrhosis of liver: Secondary | ICD-10-CM | POA: Diagnosis not present

## 2021-08-11 DIAGNOSIS — K5730 Diverticulosis of large intestine without perforation or abscess without bleeding: Secondary | ICD-10-CM | POA: Diagnosis not present

## 2021-08-11 DIAGNOSIS — Z8616 Personal history of COVID-19: Secondary | ICD-10-CM | POA: Insufficient documentation

## 2021-08-11 DIAGNOSIS — Z8719 Personal history of other diseases of the digestive system: Secondary | ICD-10-CM | POA: Diagnosis not present

## 2021-08-11 DIAGNOSIS — Z8249 Family history of ischemic heart disease and other diseases of the circulatory system: Secondary | ICD-10-CM | POA: Insufficient documentation

## 2021-08-11 DIAGNOSIS — Z5111 Encounter for antineoplastic chemotherapy: Secondary | ICD-10-CM

## 2021-08-11 DIAGNOSIS — C251 Malignant neoplasm of body of pancreas: Secondary | ICD-10-CM | POA: Insufficient documentation

## 2021-08-11 DIAGNOSIS — E041 Nontoxic single thyroid nodule: Secondary | ICD-10-CM | POA: Insufficient documentation

## 2021-08-11 DIAGNOSIS — Z9081 Acquired absence of spleen: Secondary | ICD-10-CM | POA: Diagnosis not present

## 2021-08-11 DIAGNOSIS — Z8261 Family history of arthritis: Secondary | ICD-10-CM | POA: Insufficient documentation

## 2021-08-11 DIAGNOSIS — Z8546 Personal history of malignant neoplasm of prostate: Secondary | ICD-10-CM | POA: Insufficient documentation

## 2021-08-11 DIAGNOSIS — E785 Hyperlipidemia, unspecified: Secondary | ICD-10-CM | POA: Diagnosis not present

## 2021-08-11 DIAGNOSIS — K219 Gastro-esophageal reflux disease without esophagitis: Secondary | ICD-10-CM | POA: Diagnosis not present

## 2021-08-11 DIAGNOSIS — Z79899 Other long term (current) drug therapy: Secondary | ICD-10-CM | POA: Insufficient documentation

## 2021-08-11 DIAGNOSIS — I700 Atherosclerosis of aorta: Secondary | ICD-10-CM | POA: Insufficient documentation

## 2021-08-11 DIAGNOSIS — J984 Other disorders of lung: Secondary | ICD-10-CM | POA: Diagnosis not present

## 2021-08-11 DIAGNOSIS — Z85828 Personal history of other malignant neoplasm of skin: Secondary | ICD-10-CM | POA: Insufficient documentation

## 2021-08-11 DIAGNOSIS — I722 Aneurysm of renal artery: Secondary | ICD-10-CM | POA: Diagnosis not present

## 2021-08-11 LAB — COMPREHENSIVE METABOLIC PANEL
ALT: 35 U/L (ref 0–44)
AST: 37 U/L (ref 15–41)
Albumin: 3.7 g/dL (ref 3.5–5.0)
Alkaline Phosphatase: 100 U/L (ref 38–126)
Anion gap: 9 (ref 5–15)
BUN: 27 mg/dL — ABNORMAL HIGH (ref 8–23)
CO2: 32 mmol/L (ref 22–32)
Calcium: 10 mg/dL (ref 8.9–10.3)
Chloride: 97 mmol/L — ABNORMAL LOW (ref 98–111)
Creatinine, Ser: 0.93 mg/dL (ref 0.61–1.24)
GFR, Estimated: >60 mL/min (ref >60)
Glucose, Bld: 136 mg/dL — ABNORMAL HIGH (ref 70–99)
Potassium: 4.2 mmol/L (ref 3.5–5.1)
Sodium: 138 mmol/L (ref 135–145)
Total Bilirubin: 0.5 mg/dL (ref 0.3–1.2)
Total Protein: 6.9 g/dL (ref 6.5–8.1)

## 2021-08-11 LAB — CBC WITH DIFFERENTIAL/PLATELET
Abs Immature Granulocytes: 0.01 10*3/uL (ref 0.00–0.07)
Basophils Absolute: 0.1 10*3/uL (ref 0.0–0.1)
Basophils Relative: 1 %
Eosinophils Absolute: 0.1 10*3/uL (ref 0.0–0.5)
Eosinophils Relative: 1 %
HCT: 38.8 % — ABNORMAL LOW (ref 39.0–52.0)
Hemoglobin: 13.2 g/dL (ref 13.0–17.0)
Immature Granulocytes: 0 %
Lymphocytes Relative: 27 %
Lymphs Abs: 1.3 10*3/uL (ref 0.7–4.0)
MCH: 33 pg (ref 26.0–34.0)
MCHC: 34 g/dL (ref 30.0–36.0)
MCV: 97 fL (ref 80.0–100.0)
Monocytes Absolute: 1.1 10*3/uL — ABNORMAL HIGH (ref 0.1–1.0)
Monocytes Relative: 22 %
Neutro Abs: 2.5 10*3/uL (ref 1.7–7.7)
Neutrophils Relative %: 49 %
Platelets: 454 10*3/uL — ABNORMAL HIGH (ref 150–400)
RBC: 4 MIL/uL — ABNORMAL LOW (ref 4.22–5.81)
RDW: 14.6 % (ref 11.5–15.5)
WBC: 5 10*3/uL (ref 4.0–10.5)
nRBC: 0 % (ref 0.0–0.2)

## 2021-08-11 NOTE — Progress Notes (Signed)
Patient here for oncology follow-up appointment, expresses no new concerns at this time.    

## 2021-08-11 NOTE — Progress Notes (Signed)
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  He was having symptoms of Abdominal pain and reflux and therefore underwent a CT abdomen and pelvis with contrast on 09/11/2020 which showed a hypointense ill-defined mass in the pancreatic body up to 25 mm.  No extrapancreatic infiltrative density about the aorta or proximal [name].  No local regional adenopathy or distant metastatic disease.  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  This was followed by an EUS at [facility] which showed a hypoechoic mass measuring 1.8 x 1.6 cm in the pancreatic body.  Endosonographic borders poorly defined.  Upstream pancreatic duct dilatation.  No abnormal appearing lymph nodes.  Endosonographic imaging of the liver showed no lesion.  Biopsy showed adenocarcinoma.   PET scan showed 2.7 x 2 cm ovoid mass in the body of the pancreas with an SUV of 7.1.  18 mm hypermetabolic nodule in the isthmus of the thyroid gland.  No evidence of local regional adenopathy or distant metastatic disease.   Patient was evaluated by Dr. Hyman Hopes at [facility] for consideration of surgery and has been deemed to be upfront resectable surgical candidate.  However plan was to offer him neoadjuvant chemotherapy for 3 months to control micrometastatic disease before proceeding with definitive surgery.  If ultimately patient decides not to proceed with surgery then radiation at [facility] also remains an option.    Patient received 8 doses of gemcitabine and Abraxane chemotherapy 1 week on 1 week off due to neutropenia and thrombocytopenia. Patient underwent laparoscopy surgery by Dr. Hyman Hopes at [facility].  Final pathology showed 3 cm grade 3 poorly differentiated adenocarcinoma of the pancreas with negative margins.  15 lymph nodes negative for malignancy.  Absent chemotherapy response with extensive residual cancer and no evidence of tumor regression.  Perineural invasion identified lymphovascular invasion not identified.  Patient completed 4 months of adjuvant gemcitabine Abraxane chemotherapy in January 2023    Interval history-presently is doing well and denies any specific complaints at this time.  He is trying to wean himself off Creon as well as tamsulosin.  Appetite and weight have otherwise remained stable.  Denies any abdominal pain  ECOG PS- 1 Pain scale- 0   Review of systems- Review of Systems  Constitutional:  Negative for chills, fever, malaise/fatigue and weight loss.  HENT:  Negative for congestion, ear discharge and nosebleeds.   Eyes:  Negative for blurred vision.  Respiratory:  Negative for cough, hemoptysis, sputum production, shortness of breath and wheezing.   Cardiovascular:  Negative for chest pain, palpitations, orthopnea and claudication.  Gastrointestinal:  Negative for abdominal pain, blood in stool, constipation, diarrhea, heartburn, melena, nausea and vomiting.  Genitourinary:  Negative for dysuria, flank pain, frequency, hematuria and urgency.  Musculoskeletal:  Negative for back pain, joint pain and myalgias.  Skin:  Negative for rash.  Neurological:  Negative for dizziness, tingling, focal weakness, seizures, weakness and headaches.  Endo/Heme/Allergies:  Does not bruise/bleed easily.  Psychiatric/Behavioral:  Negative for depression and suicidal ideas. The patient does not have  insomnia.      Allergies  Allergen Reactions   Kenalog [Triamcinolone Acetonide]      Blindness X 3 Days injection     Past Medical History:  Diagnosis Date   Allergy    Aneurysm of abdominal aorta    Basal cell carcinoma    right temple Dr. Evorn Gong 12/2018   Chicken pox    Cirrhosis of liver (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     Past Surgical History:  Procedure Laterality Date   CATARACT EXTRACTION Right    CATARACT EXTRACTION W/PHACO Left 12/26/2017   Procedure: CATARACT EXTRACTION PHACO AND INTRAOCULAR LENS PLACEMENT ([place])  COMPLICATED LEFT;  Surgeon: Leandrew Koyanagi, MD;  Location: [facility];  Service: Ophthalmology;  Laterality: Left;  [place]   colonoscopy     ESOPHAGOGASTRODUODENOSCOPY (EGD) WITH PROPOFOL     EYE MUSCLE SURGERY Left    PORTA CATH INSERTION N/A 10/19/2020   Procedure: PORTA CATH INSERTION;  Surgeon: Algernon Huxley, MD;  Location: [facility];  Service: Cardiovascular;  Laterality: N/A;   TONSILLECTOMY AND ADENOIDECTOMY      Social History   Socioeconomic History   Marital status: Married    Spouse name: Eloise   Number of children: 4   Years of education: Not on file   Highest education level: Not on file  Occupational History   Occupation: retired  Tobacco Use   Smoking status: Never   Smokeless tobacco: Never  Vaping Use   Vaping Use: Never used  Substance and Sexual Activity   Alcohol use: No   Drug use: No   Sexual activity: Yes    Partners: Female  Other Topics Concern   Not on file  Social History Narrative   Lives with wife in a 5 story retirement home.  He lives on the first floor.  4 year college degree.  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     Social Determinants of Health   Financial Resource Strain: Not on file   Food Insecurity: Not on file  Transportation Needs: Not on file  Physical Activity: Not on file  Stress: Not on file  Social Connections: Not on file  Intimate Partner Violence: Not on file    Family History  Problem Relation Age of Onset   Stroke Mother    Arthritis Father    Heart disease Father    Brain cancer Brother        1/2 brother (possibly related to mother NOT related to patient)   Colon cancer Neg Hx    Esophageal cancer Neg Hx    Pancreatic cancer Neg Hx    Stomach cancer Neg Hx    Liver disease Neg Hx      Current Outpatient Medications:    acetaminophen (TYLENOL) 325 MG tablet, Take 650 mg by mouth every 6 (six) hours as needed., Disp: , Rfl:    Calcium Carb-Cholecalciferol (CALCIUM 1000 + D PO), Take 1 tablet by mouth daily. , Disp: , Rfl:    Cholecalciferol 50 MCG (2000 UT) CAPS, Take 1 capsule by mouth daily., Disp: , Rfl:    Cranberry 1000 MG CAPS, Take 2 tablets by mouth  daily., Disp: , Rfl:    CREON 36000-114000 units CPEP capsule, Take 36,000 Units by mouth. 1 pills with meal, o with  pill with snack, Disp: , Rfl:    Denosumab (PROLIA [place]), Inject into the skin every 6 (six) months., Disp: , Rfl:    glucosamine-chondroitin 500-400 MG tablet, Take 1 tablet by mouth daily., Disp: , Rfl:    hydrocortisone (ANUSOL-HC) 2.5 % rectal cream, Place 1 application rectally 2 (two) times daily., Disp: 30 g, Rfl: 0   lidocaine-prilocaine (EMLA) cream, Apply to affected area once, Disp: 30 g, Rfl: 3   loratadine (CLARITIN) 10 MG tablet, Take 10 mg by mouth daily as needed., Disp: , Rfl:    metoprolol succinate (TOPROL-XL) 50 MG 24 hr tablet, Take 1.5 tablets (75 mg total) by mouth daily. (Patient taking differently: Take 50 mg by mouth daily.), Disp: 30 tablet, Rfl: 1   omeprazole (PRILOSEC) 40 MG capsule, Take 40 mg by mouth daily., Disp: , Rfl:    Ranibizumab (LUCENTIS IO), Inject into the eye as needed. As needed every three months., Disp: , Rfl:    tamsulosin  (FLOMAX) 0.4 MG CAPS capsule, TAKE 1 CAPSULE BY MOUTH EVERY DAY AFTER SUPPER, Disp: 30 capsule, Rfl: 3   temazepam (RESTORIL) 30 MG capsule, Take 1 capsule (30 mg total) by mouth at bedtime as needed for sleep., Disp: 30 capsule, Rfl: 2   vitamin B-12 (CYANOCOBALAMIN) 1000 MCG tablet, Take 1,000 mcg by mouth in the morning and at bedtime., Disp: , Rfl:    ALPRAZolam (XANAX) 0.25 MG tablet, 0.25 mg at bedtime as needed. (Patient not taking: Reported on 07/06/2021), Disp: , Rfl:    famotidine (PEPCID) 10 MG tablet, Take 10 mg by mouth. prn (Patient not taking: Reported on 07/06/2021), Disp: , Rfl:  No current facility-administered medications for this visit.  Facility-Administered Medications Ordered in Other Visits:    heparin lock flush 100 UNIT/ML injection, , , ,   Physical exam:  Vitals:   08/11/21 0919  BP: 111/66  Pulse: 70  Resp: 18  Temp: (!) 97.5 F (36.4 C)  TempSrc: Tympanic  SpO2: 99%  Weight: 183 lb (83 kg)   Physical Exam Constitutional:      General: He is not in acute distress. Cardiovascular:     Rate and Rhythm: Normal rate and regular rhythm.     Heart sounds: Normal heart sounds.  Pulmonary:     Effort: Pulmonary effort is normal.     Breath sounds: Normal breath sounds.  Abdominal:     General: Bowel sounds are normal.     Palpations: Abdomen is soft.  Skin:    General: Skin is warm and dry.  Neurological:     Mental Status: He is alert and oriented to person, place, and time.     CMP Latest Ref Rng & Units 08/11/2021  Glucose 70 - 99 mg/dL 136(H)  BUN 8 - 23 mg/dL 27(H)  Creatinine 0.61 - 1.24 mg/dL 0.93  Sodium 135 - 145 mmol/L 138  Potassium 3.5 - 5.1 mmol/L 4.2  Chloride 98 - 111 mmol/L 97(L)  CO2 22 - 32 mmol/L 32  Calcium 8.9 - 10.3 mg/dL 10.0  Total Protein 6.5 - 8.1 g/dL 6.9  Total Bilirubin 0.3 - 1.2 mg/dL 0.5  Alkaline Phos 38 - 126 U/L 100  AST 15 - 41 U/L 37  ALT 0 - 44 U/L 35   CBC Latest Ref Rng & Units 08/11/2021  WBC 4.0 - 10.5  K/uL 5.0  Hemoglobin 13.0 - 17.0 g/dL 13.2  Hematocrit 39.0 - 52.0 % 38.8(L)  Platelets 150 - 400 K/uL 454(H)    No images are attached to the encounter.  CT CHEST ABDOMEN PELVIS W CONTRAST  Result Date: 08/05/2021 CLINICAL DATA:  History of pancreatic cancer status post distal pancreatectomy and splenectomy. EXAM: CT CHEST, ABDOMEN, AND PELVIS WITH CONTRAST TECHNIQUE: Multidetector CT imaging of the chest, abdomen and pelvis was performed following the standard protocol during bolus administration of intravenous contrast. RADIATION DOSE REDUCTION: This exam was performed according to the departmental dose-optimization program which includes automated exposure control, adjustment of the mA and/or kV according to patient size and/or use of iterative reconstruction technique. CONTRAST:  118mL OMNIPAQUE IOHEXOL 300 MG/ML  SOLN COMPARISON:  Multiple priors including PET-CT September 29, 2020, chest CT February 11, 2021, and CT abdomen pelvis September 11, 2020 FINDINGS: CT CHEST FINDINGS Cardiovascular: Right chest wall Port-A-Cath with tip near the superior cavoatrial junction. Ascending thoracic aorta measures 4.6 cm previously 4.5 cm. Normal size heart. No significant pericardial effusion/thickening. No central pulmonary embolus on this nondedicated study. Mediastinum/Nodes: Hypodense 18 mm thyroid isthmus nodule, similar to prior. No pathologically enlarged mediastinal, hilar or axillary lymph nodes. Esophagus and trachea are grossly unremarkable. Lungs/Pleura: Biapical pleuroparenchymal scarring similar prior. No suspicious pulmonary nodules or masses. No focal airspace consolidation. No pleural effusion. No pneumothorax. Musculoskeletal: No suspicious chest wall mass. No aggressive lytic or blastic lesion of bone. CT ABDOMEN PELVIS FINDINGS Hepatobiliary: No suspicious hepatic lesion. Enlargement of the left and caudate lobe of the liver with some lobulation, consistent with documented history of cirrhosis in  patient's chart. Gallbladder is unremarkable. No biliary ductal dilation. Pancreas: Status post distal pancreatectomy without suspicious enhancing nodularity in the surgical bed. No pancreatic ductal dilation or evidence of acute inflammation involving the residual pancreas. Spleen: Surgically absent Adrenals/Urinary Tract: Bilateral adrenal glands appear normal. No hydronephrosis. The kidneys demonstrate symmetric enhancement excretion of contrast material. No solid enhancing renal mass. Urinary bladder is unremarkable for degree of distension. Stomach/Bowel: Radiopaque enteric contrast material traverses the splenic flexure. Stomach is unremarkable for degree of distension. No pathologic dilation of small or large bowel. The appendix and terminal ileum appear normal. Colonic diverticulosis without findings of acute diverticulitis. Vascular/Lymphatic: Aortic atherosclerosis without aneurysmal dilation. 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 The portal, splenic and superior mesenteric veins appear patent. No pathologically enlarged abdominal or pelvic lymph nodes. Reproductive: Fiducial markers in the prostate. Other: No significant abdominopelvic free fluid. Postsurgical change in the abdominal wall. No discrete peritoneal or omental nodularity. Musculoskeletal: No aggressive lytic or blastic lesion of bone. Remote L2 superior endplate compression deformity. Benign sclerosis in the inter trochanteric right femur is again seen. IMPRESSION: 1. Status post distal pancreatectomy and splenectomy without evidence of residual or recurrent disease. 2. No evidence of metastatic disease within the chest, abdomen, or pelvis. 3. Ascending thoracic aortic aneurysm measures 4.6 cm similar prior. Ascending thoracic aortic aneurysm. Recommend semi-annual imaging followup by CTA or MRA and referral to cardiothoracic surgery if not already obtained. This recommendation follows 2010  ACCF/AHA/AATS/ACR/ASA/SCA/SCAI/SIR/STS/SVM Guidelines for the Diagnosis and Management of Patients With Thoracic Aortic Disease. Circulation. 2010; 121: T614-E315. Aortic aneurysm NOS (ICD10-I71.9) 4. Hypodense 18 mm nodule in the thyroid isthmus. Recommend thyroid US (ref: J Am Coll Radiol. 2015 Feb;12(2): 143-50). 5. 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 6. Colonic diverticulosis without findings of acute diverticulitis. 7. Aortic Atherosclerosis (ICD10-I70.0). Electronically Signed  By: Dahlia Bailiff M.D.   On: 08/05/2021 08:20     Assessment and plan- Patient is a 88 y.o. male stage Ib pancreatic adenocarcinoma s/p 3 months of neoadjuvant gemcitabine Abraxane chemotherapy followed by roboticAssisted laparoscopy and distal pancreatectomy.  Patient has completed adjuvant chemotherapy and is here to discuss CT scan results and further management  I have reviewed CT chest abdomen pelvis images independently and discussed findings with the patient which does not show any evidence of recurrent or progressive disease.  He has a 4.6 cm ascending aortic aneurysm which will be monitored conservatively.   I plan to do a repeat surveillance imaging in 4 months and see him thereafter.  Port will still be in place and will be flushed every 3 months and I will check a CBC with differential CMP and CA 19-9 on that day   Visit Diagnosis 1. Malignant neoplasm of body of pancreas ([place])      Dr. Randa Evens, MD, MPH [facility] [phone] 08/11/2021 12:48 PM

## 2021-08-12 LAB — CANCER ANTIGEN 19-9: CA 19-9: 10 U/mL (ref 0–35)

## 2021-08-23 ENCOUNTER — Encounter: Payer: Self-pay | Admitting: *Deleted

## 2021-08-23 DIAGNOSIS — H353132 Nonexudative age-related macular degeneration, bilateral, intermediate dry stage: Secondary | ICD-10-CM | POA: Diagnosis not present

## 2021-08-23 DIAGNOSIS — H5203 Hypermetropia, bilateral: Secondary | ICD-10-CM | POA: Diagnosis not present

## 2021-08-23 DIAGNOSIS — H02889 Meibomian gland dysfunction of unspecified eye, unspecified eyelid: Secondary | ICD-10-CM | POA: Diagnosis not present

## 2021-08-25 DIAGNOSIS — H43813 Vitreous degeneration, bilateral: Secondary | ICD-10-CM | POA: Diagnosis not present

## 2021-08-25 DIAGNOSIS — H31093 Other chorioretinal scars, bilateral: Secondary | ICD-10-CM | POA: Diagnosis not present

## 2021-08-25 DIAGNOSIS — H35433 Paving stone degeneration of retina, bilateral: Secondary | ICD-10-CM | POA: Diagnosis not present

## 2021-08-25 DIAGNOSIS — H353211 Exudative age-related macular degeneration, right eye, with active choroidal neovascularization: Secondary | ICD-10-CM | POA: Diagnosis not present

## 2021-08-25 DIAGNOSIS — H353223 Exudative age-related macular degeneration, left eye, with inactive scar: Secondary | ICD-10-CM | POA: Diagnosis not present

## 2021-09-01 ENCOUNTER — Other Ambulatory Visit: Payer: Self-pay

## 2021-09-01 ENCOUNTER — Encounter: Payer: Self-pay | Admitting: Oncology

## 2021-09-01 ENCOUNTER — Encounter: Payer: Self-pay | Admitting: Gastroenterology

## 2021-09-01 ENCOUNTER — Ambulatory Visit: Payer: PPO | Admitting: Gastroenterology

## 2021-09-01 VITALS — BP 119/76 | HR 64 | Temp 97.8°F | Ht 76.0 in | Wt 185.1 lb

## 2021-09-01 DIAGNOSIS — K8689 Other specified diseases of pancreas: Secondary | ICD-10-CM

## 2021-09-01 NOTE — Progress Notes (Signed)
?  ?Cephas Darby, MD ?[address]  ?[address]  ?[place], [place] [address]  ?Main: [phone]  ?Fax: [phone] ? ? ? ?Gastroenterology Consultation ? ?Referring Provider:     Crecencio Mc, MD ?Primary Care Physician:  Crecencio Mc, MD ?Primary Gastroenterologist:  Dr. Cephas Darby ?Reason for Consultation:    Exocrine pancreatic insufficiency, pancreatic cancer, rectal bleeding      ? HPI:   ?George Owens is a 88 y.o. male referred by Dr. Crecencio Mc, MD  for consultation & management of altered bowel habits and abdominal bloating.  Patient reports that he has been experiencing abdominal bloating, particularly postprandial associated with variable stool consistency anywhere from normal caliber to pencil thin and sometimes hard.  He underwent colonoscopy in 4/21 which revealed polyps and diverticulosis of the sigmoid colon as well as internal hemorrhoids.  Patient also had rectal pain, was treated with hemorrhoid cream.  He is not concerned about rectal pain today. ? ?Patient is also diagnosed with cirrhosis of liver based on CT chest and followed by ultrasound liver.  He does have new onset of mild thrombocytopenia, 142 in 4/21.  Patient underwent work-up for secondary liver disease, found to have ANA positive and elevated ferritin <500 ? ?Follow-up visit 10/06/2020 ?Patient is diagnosed with adenocarcinoma of pancreas, stage I, resectable, planning to start neoadjuvant chemo.  He continues to have abdominal bloating and loose stools, primarily postprandial.  His weight is stable. ? ?Follow-up visit 02/02/2021 ?Patient is here for follow-up of exocrine pancreatic insufficiency.  He is currently undergoing neoadjuvant chemo for stage I pancreatic adenocarcinoma.  Patient is also scheduled to undergo pancreaticobiliary surgery in September.  Fortunately patient's weight has been stable in last 3 months.  He finds Creon to be expensive and was denied from patient assistance program.  However,  he manages to take Creon 4 to 5 pills daily.  He wants to know if he can take over-the-counter digestive enzymes that has low-dose lipase.  He reports having abdominal bloating as well as up to 6 soft bowel movements every time he urinates.  Patient is also consuming whole milk daily.  He is accompanied by his wife today ? ?Follow-up visit 03/24/2021 ?Patient underwent robotic assisted laparoscopic distal pancreatectomy and splenectomy on 02/25/2021.  He recovered well from surgery.  He has been tolerating p.o. well and able to maintain weight.  He restarted Creon.  Because of cost issues, patient has cut back on the number of Creon pills per day to 1 or 2 pills.  He did notice that his diarrhea recurs if he does not take Creon with meals.  Patient made an urgent visit to see me because last Saturday, he started noticing bright red blood per rectum on wiping, in toilet bowel.  He was taking Lovenox injections postoperatively and his last dose was on Monday.  He thought his hemorrhoids were inflamed and he noticed some bloody discharge.  He tried over-the-counter Preparation H.  Because of the bleeding, he went to the ER.  He was diagnosed with inflamed external hemorrhoid causing rectal pain.  He was advised to continue Preparation H twice daily and add zinc oxide paste, as well as ibuprofen 400-600 mg every 6 hours.  Patient reports that rectal discomfort has significantly improved, bleeding has slowed down.  He has been using wet wipes and water to clean the area after a BM.  His bowel movements are more or less formed. ? ?Follow-up visit 08/08/2020 ?Patient is  here for follow-up of hemorrhoidal symptoms.  He has been applying Anusol cream twice daily which provided relief.  After he stopped Anusol cream, he states that his symptoms recurred but not as severe.  He denies rectal bleeding.  His weight has been stable, he managed to get Creon down to 1 capsule daily.  I discussed with him about hemorrhoid banding but he  is out of town to [place] on Friday and planning to start chemotherapy next week. ? ?Follow-up visit 09/01/2021 ?Patient is here for follow-up of heartburn and exocrine pancreatic insufficiency.  Patient wants to know if it is safe to take Protonix or omeprazole as he was told by his PCP about long-term side effects.  Patient is currently on omeprazole 40 mg daily.  His heartburn is under control currently and takes Pepcid at bedtime when he has a heavy dinner.  He is also taking Creon about 1 to 2 capsules daily, currently having formed bowel movements.  He has finished neoadjuvant chemo, gemcitabine end of January and he has done very well.  He has gained weight. ? ?NSAIDs: None ? ?Antiplts/Anticoagulants/Anti thrombotics: None ? ?GI Procedures:  ?Upper endoscopy 11/29/2018 ?- Normal esophagus. ?- No endoscopic esophageal abnormality to explain patient's dysphagia. Dilation attempted, ?but aborted due to resistance with Savary and known Zenker's diverticulum. ?- Multiple gastric polyps. Biopsied. ?- Normal examined duodenum. ? ?Surgical [P], gastric polyp BX ?- HYPERPLASTIC POLYP. ?- WARTHIN-STARRY IS NEGATIVE FOR HELICOBACTER PYLORI. ?- NO INTESTINAL METAPLASIA, DYSPLASIA, OR MALIGNANCY. ? ?Colonoscopy 10/09/2019 ?- Nine 3 to 6 mm polyps in the sigmoid colon, in the ascending colon and in the cecum, ?removed with a cold snare. Resected and retrieved. ?- Diverticulosis in the sigmoid colon. ?- Non-bleeding internal hemorrhoids. ? ?Diagnosis ?Surgical [P], colon, ascending and cecum, sigmoid, polyp (9) ?- TUBULAR ADENOMA(S) WITHOUT HIGH-GRADE DYSPLASIA OR MALIGNANCY ?- INFLAMMATORY POLYP(S) ? ?Past Medical History:  ?Diagnosis Date  ? Allergy   ? Aneurysm of abdominal aorta   ? Basal cell carcinoma   ? right temple Dr. Evorn Gong 12/2018  ? Chicken pox   ? Cirrhosis of liver ([facility]) 05/12/2020  ? Colon polyps   ? COVID-19 virus infection 03/16/2020  ? GERD (gastroesophageal reflux disease)   ? history of prostate CA 2006   ? Hyperlipidemia   ? Leg cramps   ? Neuropathy   ? Osteoporosis   ? Pancreas cancer ([facility])   ? 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 ? ?Past Surgical History:  ?Procedure Laterality Date  ? CATARACT EXTRACTION Right   ? CATARACT EXTRACTION W/PHACO Left 12/26/2017  ? Procedure: CATARACT EXTRACTION PHACO AND INTRAOCULAR LENS PLACEMENT ([facility] [facility])  COMPLICATED LEFT;  Surgeon: Leandrew Koyanagi, MD;  Location: [facility];  Service: Ophthalmology;  Laterality: Left;  MALYUGIN ?VISION BLUE  ? colonoscopy    ? ESOPHAGOGASTRODUODENOSCOPY (EGD) WITH PROPOFOL    ? EYE MUSCLE SURGERY Left   ? PORTA CATH INSERTION N/A 10/19/2020  ? Procedure: PORTA CATH INSERTION;  Surgeon: Algernon Huxley, MD;  Location: [facility];  Service: Cardiovascular;  Laterality: N/A;  ? TONSILLECTOMY AND ADENOIDECTOMY    ? ? ?Current Outpatient Medications:  ?  acetaminophen (TYLENOL) 325 MG tablet, Take 650 mg by mouth every 6 (six) hours as needed., Disp: , Rfl:  ?  Calcium Carb-Cholecalciferol (CALCIUM 1000 + D PO), Take 1 tablet by mouth daily. , Disp: , Rfl:  ?  Cholecalciferol 50 MCG (2000 UT) CAPS, Take 1 capsule by mouth daily., Disp: , Rfl:  ?  Cranberry 1000 MG CAPS, Take 2 tablets by mouth daily., Disp: , Rfl:  ?  CREON 36000-114000 units CPEP capsule, Take 36,000 Units by mouth. 1 pills with meal, o with  pill with snack, Disp: , Rfl:  ?  Denosumab (PROLIA [place]), Inject into the skin every 6 (six) months., Disp: , Rfl:  ?  famotidine (PEPCID) 10 MG tablet, Take 10 mg by mouth. prn, Disp: , Rfl:  ?  glucosamine-chondroitin 500-400 MG tablet, Take 1 tablet by mouth daily., Disp: , Rfl:  ?  hydrocortisone (ANUSOL-HC) 2.5 % rectal cream, Place 1 application rectally 2 (two) times daily., Disp: 30 g, Rfl: 0 ?  lidocaine-prilocaine (EMLA) cream, Apply to affected area once, Disp: 30 g, Rfl: 3 ?  loratadine (CLARITIN) 10 MG tablet, Take 10 mg by mouth daily as needed., Disp: , Rfl:  ?  metoprolol succinate (TOPROL-XL) 50 MG 24 hr tablet, Take  1.5 tablets (75 mg total) by mouth daily. (Patient taking differently: Take 50 mg by mouth daily.), Disp: 30 tablet, Rfl: 1 ?  omeprazole (PRILOSEC) 40 MG capsule, Take 40 mg by mouth daily., Disp: ,

## 2021-09-08 ENCOUNTER — Other Ambulatory Visit: Payer: PPO

## 2021-09-08 ENCOUNTER — Ambulatory Visit: Payer: PPO | Admitting: Oncology

## 2021-09-08 ENCOUNTER — Ambulatory Visit: Payer: PPO | Admitting: Gastroenterology

## 2021-09-08 NOTE — Telephone Encounter (Signed)
scheduled

## 2021-09-14 ENCOUNTER — Other Ambulatory Visit: Payer: Self-pay

## 2021-09-14 ENCOUNTER — Ambulatory Visit (INDEPENDENT_AMBULATORY_CARE_PROVIDER_SITE_OTHER): Payer: PPO

## 2021-09-14 DIAGNOSIS — M810 Age-related osteoporosis without current pathological fracture: Secondary | ICD-10-CM

## 2021-09-14 MED ORDER — DENOSUMAB 60 MG/ML ~~LOC~~ SOSY
60.0000 mg | PREFILLED_SYRINGE | Freq: Once | SUBCUTANEOUS | Status: AC
  Administered 2021-09-14: 60 mg via SUBCUTANEOUS

## 2021-09-14 NOTE — Progress Notes (Signed)
Pt arrived for Prolia injection, given in L arm SQ. Pt tolerated injection well, showed no signs of distress nor voiced any concerns. ? ?Pt to return in 6 months for another dose around 03/17/22. Was advised to head to check out to schedule next [place]. Pt verbalized understanding.  ?

## 2021-09-15 ENCOUNTER — Encounter: Payer: Self-pay | Admitting: Internal Medicine

## 2021-09-15 ENCOUNTER — Ambulatory Visit (INDEPENDENT_AMBULATORY_CARE_PROVIDER_SITE_OTHER): Payer: PPO | Admitting: Internal Medicine

## 2021-09-15 ENCOUNTER — Other Ambulatory Visit: Payer: Self-pay

## 2021-09-15 VITALS — BP 127/86 | HR 76 | Temp 98.2°F | Ht 75.0 in | Wt 183.6 lb

## 2021-09-15 DIAGNOSIS — I700 Atherosclerosis of aorta: Secondary | ICD-10-CM | POA: Diagnosis not present

## 2021-09-15 DIAGNOSIS — E538 Deficiency of other specified B group vitamins: Secondary | ICD-10-CM

## 2021-09-15 DIAGNOSIS — F5105 Insomnia due to other mental disorder: Secondary | ICD-10-CM | POA: Diagnosis not present

## 2021-09-15 DIAGNOSIS — K219 Gastro-esophageal reflux disease without esophagitis: Secondary | ICD-10-CM | POA: Diagnosis not present

## 2021-09-15 DIAGNOSIS — E781 Pure hyperglyceridemia: Secondary | ICD-10-CM | POA: Diagnosis not present

## 2021-09-15 DIAGNOSIS — E559 Vitamin D deficiency, unspecified: Secondary | ICD-10-CM

## 2021-09-15 DIAGNOSIS — F409 Phobic anxiety disorder, unspecified: Secondary | ICD-10-CM

## 2021-09-15 DIAGNOSIS — I480 Paroxysmal atrial fibrillation: Secondary | ICD-10-CM | POA: Diagnosis not present

## 2021-09-15 LAB — LIPID PANEL
Cholesterol: 194 mg/dL (ref 0–200)
HDL: 53.7 mg/dL (ref >39.00)
LDL Cholesterol: 115 mg/dL — ABNORMAL HIGH (ref 0–99)
NonHDL: 140.71
Total CHOL/HDL Ratio: 4
Triglycerides: 130 mg/dL (ref 0.0–149.0)
VLDL: 26 mg/dL (ref 0.0–40.0)

## 2021-09-15 LAB — VITAMIN D 25 HYDROXY (VIT D DEFICIENCY, FRACTURES): VITD: 70.98 ng/mL (ref 30.00–100.00)

## 2021-09-15 LAB — VITAMIN B12: Vitamin B-12: 1188 pg/mL — ABNORMAL HIGH (ref 211–911)

## 2021-09-15 NOTE — Patient Instructions (Addendum)
The swelling is minimal in your legs and  due to venous reflux,  it is chronic ,  not serious  and can be managed with light compression socks and leg elevation.  You don't need a diuretic ? ?[email or web address] carries compression socks  of all style,s  choose  a light compression  ? ? ?You can use pantoprazole when you have finished your current omeprazole supply ? ?If you would like to wean off the metoprolol before you see Dr Saralyn Pilar,   notify me and I will tell you how to do it. ? ?I would remain on the tamsulosin to prevent the prostate from enlarging  ?

## 2021-09-15 NOTE — Progress Notes (Signed)
? ?Subjective:  ?Patient ID: George Owens, male    DOB: Aug 31, 1931  Age: 88 y.o. MRN: [id number] ? ?CC: The primary encounter diagnosis was Vitamin D deficiency. Diagnoses of Pure hypertriglyceridemia, B12 deficiency, Thoracic aortic atherosclerosis (HCC), Gastroesophageal reflux disease without esophagitis, Insomnia due to anxiety and fear, and Paroxysmal A-fib ([place]) were also pertinent to this visit. ? ? ?This visit occurred during the SARS-CoV-2 public health emergency.  Safety protocols were in place, including screening questions prior to the visit, additional usage of staff PPE, and extensive cleaning of exam room while observing appropriate contact time as indicated for disinfecting solutions.   ? ?HPI ?George Owens presents for  for follow up on chronic issues.  He has finished chemotherapy and has concerns about his medications  ?Chief Complaint  ?Patient presents with  ? Follow-up  ?  F/u post chemo - wants to discuss medication concerns with Dr. Abbott Pao reports has finished chemo! Most recent scan clear ! Is currently cutting down on Creon. Did have some fluid build up in feet during chemo.   ? ? ?1) PANCREATIC CA:  HAS COMPLETED CHEMO.  "I'm living from scan to scan ." TAKING CREON  once daily and stools are solid.  He has regained 50% of the weight he lost during chemotherapy and wonders if he needs to regain it all ? ? 2) INSOMNIA;  getting 6 hours of sleep with temazepam 30 mg . 15 minutes before bed.  10:15 is bedtime , waking up from 4 to 5 am  ? ?3) taking tamsulosin for BPH and urinary hesitancy  wonders if he should stop it  ? ?4) GERD: back on omeprazole once daily and prn famotidine .  Symptoms controlled. .  Reviewed risks and benefits of PPI ? ?5)  taking metoprolol 50 mg since pancreatic surgery for a run of post PAF .  Has follow up with Dr. Saralyn Pilar  ? In May  ?Outpatient Medications Prior to Visit  ?Medication Sig Dispense Refill  ? acetaminophen (TYLENOL) 325 MG tablet Take 650 mg by  mouth every 6 (six) hours as needed.    ? Calcium Carb-Cholecalciferol (CALCIUM 1000 + D PO) Take 1 tablet by mouth daily.     ? Cholecalciferol 50 MCG (2000 UT) CAPS Take 1 capsule by mouth daily.    ? Cranberry 1000 MG CAPS Take 2 tablets by mouth daily.    ? CREON 36000-114000 units CPEP capsule Take 36,000 Units by mouth. 1 pills with meal, o with  pill with snack    ? Denosumab (PROLIA [place]) Inject into the skin every 6 (six) months.    ? famotidine (PEPCID) 10 MG tablet Take 10 mg by mouth. prn    ? glucosamine-chondroitin 500-400 MG tablet Take 1 tablet by mouth daily.    ? hydrocortisone (ANUSOL-HC) 2.5 % rectal cream Place 1 application rectally 2 (two) times daily. 30 g 0  ? lidocaine-prilocaine (EMLA) cream Apply to affected area once 30 g 3  ? loratadine (CLARITIN) 10 MG tablet Take 10 mg by mouth daily as needed.    ? metoprolol succinate (TOPROL-XL) 50 MG 24 hr tablet Take 1.5 tablets (75 mg total) by mouth daily. (Patient taking differently: Take 50 mg by mouth daily.) 30 tablet 1  ? omeprazole (PRILOSEC) 40 MG capsule Take 40 mg by mouth daily.    ? Ranibizumab (LUCENTIS IO) Inject into the eye as needed. As needed every three months.    ? tamsulosin (FLOMAX) 0.4 MG CAPS  capsule TAKE 1 CAPSULE BY MOUTH EVERY DAY AFTER SUPPER 30 capsule 3  ? temazepam (RESTORIL) 30 MG capsule Take 1 capsule (30 mg total) by mouth at bedtime as needed for sleep. 30 capsule 2  ? vitamin B-12 (CYANOCOBALAMIN) 1000 MCG tablet Take 1,000 mcg by mouth in the morning and at bedtime.    ? ?Facility-Administered Medications Prior to Visit  ?Medication Dose Route Frequency Provider Last Rate Last Admin  ? heparin lock flush 100 UNIT/ML injection           ? ? ?Review of Systems; ? ?Patient denies headache, fevers, malaise, unintentional weight loss, skin rash, eye pain, sinus congestion and sinus pain, sore throat, dysphagia,  hemoptysis , cough, dyspnea, wheezing, chest pain, palpitations, orthopnea, edema, abdominal pain,  nausea, melena, diarrhea, constipation, flank pain, dysuria, hematuria, urinary  Frequency, nocturia, numbness, tingling, seizures,  Focal weakness, Loss of consciousness,  Tremor, insomnia, depression, anxiety, and suicidal ideation.   ? ? ? ?Objective:  ?BP 127/86 (BP Location: Left Arm, Patient Position: Sitting, Cuff Size: Small)   Pulse 76   Temp 98.2 ?F (36.8 ?C) (Oral)   Ht '6\' 3"'$  (1.905 m)   Wt 183 lb 9.6 oz (83.3 kg)   SpO2 95%   BMI 22.95 kg/m?  ? ?BP Readings from Last 3 Encounters:  ?09/15/21 127/86  ?09/01/21 119/76  ?08/11/21 111/66  ? ? ?Wt Readings from Last 3 Encounters:  ?09/15/21 183 lb 9.6 oz (83.3 kg)  ?09/01/21 185 lb 2 oz (84 kg)  ?08/11/21 183 lb (83 kg)  ? ? ?General appearance: alert, cooperative and appears stated age ?Ears: normal TM's and external ear canals both ears ?Throat: lips, mucosa, and tongue normal; teeth and gums normal ?Neck: no adenopathy, no carotid bruit, supple, symmetrical, trachea midline and thyroid not enlarged, symmetric, no tenderness/mass/nodules ?Back: symmetric, no curvature. ROM normal. No CVA tenderness. ?Lungs: clear to auscultation bilaterally ?Heart: regular rate and rhythm, S1, S2 normal, no murmur, click, rub or gallop ?Abdomen: soft, non-tender; bowel sounds normal; no masses,  no organomegaly ?Pulses: 2+ and symmetric ?Skin: Skin color, texture, turgor normal. No rashes or lesions ?Lymph nodes: Cervical, supraclavicular, and axillary nodes normal. ? ?No results found for: HGBA1C ? ?Lab Results  ?Component Value Date  ? CREATININE 0.93 08/11/2021  ? CREATININE 0.88 07/20/2021  ? CREATININE 0.80 07/06/2021  ? ? ?Lab Results  ?Component Value Date  ? WBC 5.0 08/11/2021  ? HGB 13.2 08/11/2021  ? HCT 38.8 (L) 08/11/2021  ? PLT 454 (H) 08/11/2021  ? GLUCOSE 136 (H) 08/11/2021  ? CHOL 194 09/15/2021  ? TRIG 130.0 09/15/2021  ? HDL 53.70 09/15/2021  ? LDLDIRECT 129.0 03/31/2017  ? LDLCALC 115 (H) 09/15/2021  ? ALT 35 08/11/2021  ? AST 37 08/11/2021  ? NA  138 08/11/2021  ? K 4.2 08/11/2021  ? CL 97 (L) 08/11/2021  ? CREATININE 0.93 08/11/2021  ? BUN 27 (H) 08/11/2021  ? CO2 32 08/11/2021  ? TSH 5.649 (H) 07/20/2021  ? PSA 0.51 04/27/2020  ? INR 1.1 12/26/2020  ? ? ?CT CHEST ABDOMEN PELVIS W CONTRAST ? ?Result Date: 08/05/2021 ?CLINICAL DATA:  History of pancreatic cancer status post distal pancreatectomy and splenectomy. EXAM: CT CHEST, ABDOMEN, AND PELVIS WITH CONTRAST TECHNIQUE: Multidetector CT imaging of the chest, abdomen and pelvis was performed following the standard protocol during bolus administration of intravenous contrast. RADIATION DOSE REDUCTION: This exam was performed according to the departmental dose-optimization program which includes automated exposure control, adjustment of  the mA and/or kV according to patient size and/or use of iterative reconstruction technique. CONTRAST:  155m OMNIPAQUE IOHEXOL 300 MG/ML  SOLN COMPARISON:  Multiple priors including PET-CT September 29, 2020, chest CT February 11, 2021, and CT abdomen pelvis September 11, 2020 FINDINGS: CT CHEST FINDINGS Cardiovascular: Right chest wall Port-A-Cath with tip near the superior cavoatrial junction. Ascending thoracic aorta measures 4.6 cm previously 4.5 cm. Normal size heart. No significant pericardial effusion/thickening. No central pulmonary embolus on this nondedicated study. Mediastinum/Nodes: Hypodense 18 mm thyroid isthmus nodule, similar to prior. No pathologically enlarged mediastinal, hilar or axillary lymph nodes. Esophagus and trachea are grossly unremarkable. Lungs/Pleura: Biapical pleuroparenchymal scarring similar prior. No suspicious pulmonary nodules or masses. No focal airspace consolidation. No pleural effusion. No pneumothorax. Musculoskeletal: No suspicious chest wall mass. No aggressive lytic or blastic lesion of bone. CT ABDOMEN PELVIS FINDINGS Hepatobiliary: No suspicious hepatic lesion. Enlargement of the left and caudate lobe of the liver with some lobulation,  consistent with documented history of cirrhosis in patient's chart. Gallbladder is unremarkable. No biliary ductal dilation. Pancreas: Status post distal pancreatectomy without suspicious enhancing nodularity in the su

## 2021-09-16 NOTE — Assessment & Plan Note (Signed)
Transient and peri proceduralyl.  Can begin to wean metoprolol  ?

## 2021-09-16 NOTE — Assessment & Plan Note (Signed)
Untreated due to statin myalgia  ?

## 2021-09-16 NOTE — Assessment & Plan Note (Addendum)
Reviewed findings of prior CT scan today..  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 

## 2021-09-16 NOTE — Assessment & Plan Note (Signed)
Continue daily use of omeprazole,  And  famotidine qac  ?

## 2021-09-16 NOTE — Assessment & Plan Note (Signed)
He is at maximal dose of temazepam .  Given his ability to sleep 6 hours on current dose ,  No changes made  ?

## 2021-09-20 ENCOUNTER — Other Ambulatory Visit: Payer: Self-pay | Admitting: Internal Medicine

## 2021-09-20 ENCOUNTER — Other Ambulatory Visit: Payer: Self-pay

## 2021-09-20 ENCOUNTER — Inpatient Hospital Stay (HOSPITAL_BASED_OUTPATIENT_CLINIC_OR_DEPARTMENT_OTHER): Payer: PPO | Admitting: Hospice and Palliative Medicine

## 2021-09-20 ENCOUNTER — Telehealth: Payer: Self-pay | Admitting: *Deleted

## 2021-09-20 ENCOUNTER — Inpatient Hospital Stay: Payer: PPO | Attending: Oncology

## 2021-09-20 VITALS — BP 132/67 | HR 67 | Temp 98.0°F | Resp 16

## 2021-09-20 DIAGNOSIS — Z8507 Personal history of malignant neoplasm of pancreas: Secondary | ICD-10-CM | POA: Diagnosis not present

## 2021-09-20 DIAGNOSIS — Z452 Encounter for adjustment and management of vascular access device: Secondary | ICD-10-CM | POA: Diagnosis not present

## 2021-09-20 DIAGNOSIS — R1031 Right lower quadrant pain: Secondary | ICD-10-CM | POA: Insufficient documentation

## 2021-09-20 DIAGNOSIS — C251 Malignant neoplasm of body of pancreas: Secondary | ICD-10-CM

## 2021-09-20 DIAGNOSIS — Z95828 Presence of other vascular implants and grafts: Secondary | ICD-10-CM

## 2021-09-20 DIAGNOSIS — G8929 Other chronic pain: Secondary | ICD-10-CM

## 2021-09-20 LAB — CBC WITH DIFFERENTIAL/PLATELET
Abs Immature Granulocytes: 0.02 10*3/uL (ref 0.00–0.07)
Basophils Absolute: 0 10*3/uL (ref 0.0–0.1)
Basophils Relative: 0 %
Eosinophils Absolute: 0.1 10*3/uL (ref 0.0–0.5)
Eosinophils Relative: 2 %
HCT: 39.6 % (ref 39.0–52.0)
Hemoglobin: 13.6 g/dL (ref 13.0–17.0)
Immature Granulocytes: 0 %
Lymphocytes Relative: 29 %
Lymphs Abs: 2.1 10*3/uL (ref 0.7–4.0)
MCH: 31.6 pg (ref 26.0–34.0)
MCHC: 34.3 g/dL (ref 30.0–36.0)
MCV: 92.1 fL (ref 80.0–100.0)
Monocytes Absolute: 0.9 10*3/uL (ref 0.1–1.0)
Monocytes Relative: 12 %
Neutro Abs: 4.2 10*3/uL (ref 1.7–7.7)
Neutrophils Relative %: 57 %
Platelets: 249 10*3/uL (ref 150–400)
RBC: 4.3 MIL/uL (ref 4.22–5.81)
RDW: 13.5 % (ref 11.5–15.5)
WBC: 7.3 10*3/uL (ref 4.0–10.5)
nRBC: 0 % (ref 0.0–0.2)

## 2021-09-20 LAB — COMPREHENSIVE METABOLIC PANEL
ALT: 39 U/L (ref 0–44)
AST: 35 U/L (ref 15–41)
Albumin: 3.9 g/dL (ref 3.5–5.0)
Alkaline Phosphatase: 101 U/L (ref 38–126)
Anion gap: 7 (ref 5–15)
BUN: 23 mg/dL (ref 8–23)
CO2: 27 mmol/L (ref 22–32)
Calcium: 8.2 mg/dL — ABNORMAL LOW (ref 8.9–10.3)
Chloride: 103 mmol/L (ref 98–111)
Creatinine, Ser: 0.77 mg/dL (ref 0.61–1.24)
GFR, Estimated: >60 mL/min (ref >60)
Glucose, Bld: 142 mg/dL — ABNORMAL HIGH (ref 70–99)
Potassium: 4.1 mmol/L (ref 3.5–5.1)
Sodium: 137 mmol/L (ref 135–145)
Total Bilirubin: 1.1 mg/dL (ref 0.3–1.2)
Total Protein: 6.4 g/dL — ABNORMAL LOW (ref 6.5–8.1)

## 2021-09-20 LAB — URINALYSIS, COMPLETE (UACMP) WITH MICROSCOPIC
Bacteria, UA: NONE SEEN
Bilirubin Urine: NEGATIVE
Glucose, UA: NEGATIVE mg/dL
Hgb urine dipstick: NEGATIVE
Ketones, ur: NEGATIVE mg/dL
Leukocytes,Ua: NEGATIVE
Nitrite: NEGATIVE
Protein, ur: NEGATIVE mg/dL
Specific Gravity, Urine: 1.009 (ref 1.005–1.030)
WBC, UA: NONE SEEN WBC/hpf (ref 0–5)
pH: 6 (ref 5.0–8.0)

## 2021-09-20 MED ORDER — SODIUM CHLORIDE 0.9% FLUSH
10.0000 mL | Freq: Once | INTRAVENOUS | Status: AC
  Administered 2021-09-20: 10 mL via INTRAVENOUS
  Filled 2021-09-20: qty 10

## 2021-09-20 MED ORDER — HEPARIN SOD (PORK) LOCK FLUSH 100 UNIT/ML IV SOLN
500.0000 [IU] | Freq: Once | INTRAVENOUS | Status: AC
  Administered 2021-09-20: 500 [IU] via INTRAVENOUS
  Filled 2021-09-20: qty 5

## 2021-09-20 MED ORDER — TRAMADOL HCL 50 MG PO TABS: 25.0000 mg | ORAL_TABLET | Freq: Two times a day (BID) | ORAL | 0 refills | Status: DC | PRN

## 2021-09-20 NOTE — Telephone Encounter (Signed)
Refilled: 06/23/2021 ?Last OV: 09/15/2021  ?Next OV: not scheduled ?

## 2021-09-20 NOTE — Progress Notes (Signed)
Pt in smc with complaints of intermittent pain to right lower abdomen and right lower back. Reports that the pain is worse with movement and better at rest. Denies urinary symptoms, diarrhea, constipation, nausea/vomiting, or fever.  ?

## 2021-09-20 NOTE — Progress Notes (Signed)
? ?[facility]pt[facility] ?George Owens at [facility] ?Telephone:([phone]) [phone] Fax:(336) [phone] ? ?Patient Care Team: ?Crecencio Mc, MD as PCP - General (Internal Medicine) ?Clent Jacks, RN as Oncology Nurse Navigator  ? ?Name of the patient: George Owens  ?[id number]  ?12/01/1931  ? ?Date of visit: 09/20/21 ? ?Reason for Consult: ?George Owens is a 88 y.o. male with multiple medical problems including stage Ib pancreatic cancer status post neoadjuvant chemotherapy followed by resection.  Patient is also status post adjuvant chemotherapy. ? ?Patient saw Dr. Janese Banks on 08/11/2021 to review results of surveillance CTs.  CT of the chest, abdomen, and pelvis did not reveal any evidence of recurrent or progressive disease.  Plan is for repeat surveillance imaging in 4 months. ? ?Patient presents to [facility] for evaluation of mild right lower quadrant/low back pain.  Patient says that he has had pain intermittently over the past 3 days or so.  Pain is primarily with movement and abates with rest.  He has tried acetaminophen and ibuprofen with little relief.  He says that pain feels like abdominal cramping.  It sometimes goes into the groin.  He rates it at 7 or 8 out of 10 at its worst but is often 0 out of 10.  He denies fever or chills.  No nausea or vomiting.  No changes in appetite.  No diarrhea.  He had a bowel movement today but sometimes goes several days without having a bowel movement.  He denies urinary symptoms. ? ?Denies any neurologic complaints. Denies recent fevers or illnesses. Denies any easy bleeding or bruising. Reports good appetite and denies weight loss. Denies chest pain. Denies any nausea, vomiting, constipation, or diarrhea. Denies urinary complaints. Patient offers no further specific complaints today. ? ?PAST MEDICAL HISTORY: ?Past Medical History:  ?Diagnosis Date  ? Allergy   ? Aneurysm of abdominal aorta   ? Basal cell carcinoma   ? right temple Dr. Evorn Gong 12/2018   ? Chicken pox   ? Cirrhosis of liver ([place]) 05/12/2020  ? Colon polyps   ? COVID-19 virus infection 03/16/2020  ? GERD (gastroesophageal reflux disease)   ? history of prostate CA 2006  ? Hyperlipidemia   ? Leg cramps   ? Neuropathy   ? Osteoporosis   ? Pancreas cancer ([place])   ? 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 ? ?PAST SURGICAL HISTORY:  ?Past Surgical History:  ?Procedure Laterality Date  ? CATARACT EXTRACTION Right   ? CATARACT EXTRACTION W/PHACO Left 12/26/2017  ? Procedure: CATARACT EXTRACTION PHACO AND INTRAOCULAR LENS PLACEMENT ([place])  COMPLICATED LEFT;  Surgeon: Leandrew Koyanagi, MD;  Location: [place];  Service: Ophthalmology;  Laterality: Left;  MALYUGIN ?VISION BLUE  ? colonoscopy    ? ESOPHAGOGASTRODUODENOSCOPY (EGD) WITH PROPOFOL    ? EYE MUSCLE SURGERY Left   ? PORTA CATH INSERTION N/A 10/19/2020  ? Procedure: PORTA CATH INSERTION;  Surgeon: Algernon Huxley, MD;  Location: [place] [facility];  Service: Cardiovascular;  Laterality: N/A;  ? TONSILLECTOMY AND ADENOIDECTOMY    ? ? ?HEMATOLOGY/ONCOLOGY HISTORY:  ?Oncology History  ?Malignant neoplasm of body of pancreas ([place])  ?09/18/2020 Initial Diagnosis  ? Malignant neoplasm of body of pancreas [facility]) ?  ?10/02/2020 Cancer Staging  ? Staging form: Exocrine Pancreas, AJCC 8th Edition ?- Clinical stage from 10/02/2020: Stage IB (cT2, cN0, cM0) - Signed by Sindy Guadeloupe, MD on 10/04/2020 ?Total positive nodes: 0 ? ?  ?10/08/2020 -  Chemotherapy  ? Patient is on  Treatment Plan : PANCREATIC Abraxane / Gemcitabine D1,8,15 q28d  ?   ? ? ?ALLERGIES:  is allergic to kenalog [triamcinolone acetonide]. ? ?MEDICATIONS:  ?Current Outpatient Medications  ?Medication Sig Dispense Refill  ? acetaminophen (TYLENOL) 325 MG tablet Take 650 mg by mouth every 6 (six) hours as needed.    ? Calcium Carb-Cholecalciferol (CALCIUM 1000 + D PO) Take 1 tablet by mouth daily.     ? Cholecalciferol 50 MCG (2000 UT) CAPS Take 1 capsule by mouth daily.    ? Cranberry 1000 MG CAPS  Take 2 tablets by mouth daily.    ? CREON 36000-114000 units CPEP capsule Take 36,000 Units by mouth. 1 pills with meal, o with  pill with snack    ? Denosumab (PROLIA [place]) Inject into the skin every 6 (six) months.    ? famotidine (PEPCID) 10 MG tablet Take 10 mg by mouth. prn    ? glucosamine-chondroitin 500-400 MG tablet Take 1 tablet by mouth daily.    ? hydrocortisone (ANUSOL-HC) 2.5 % rectal cream Place 1 application rectally 2 (two) times daily. 30 g 0  ? lidocaine-prilocaine (EMLA) cream Apply to affected area once 30 g 3  ? loratadine (CLARITIN) 10 MG tablet Take 10 mg by mouth daily as needed.    ? metoprolol succinate (TOPROL-XL) 50 MG 24 hr tablet Take 1.5 tablets (75 mg total) by mouth daily. (Patient taking differently: Take 50 mg by mouth daily.) 30 tablet 1  ? omeprazole (PRILOSEC) 40 MG capsule Take 40 mg by mouth daily.    ? Ranibizumab (LUCENTIS IO) Inject into the eye as needed. As needed every three months.    ? tamsulosin (FLOMAX) 0.4 MG CAPS capsule TAKE 1 CAPSULE BY MOUTH EVERY DAY AFTER SUPPER 30 capsule 3  ? temazepam (RESTORIL) 30 MG capsule Take 1 capsule (30 mg total) by mouth at bedtime as needed for sleep. 30 capsule 2  ? vitamin B-12 (CYANOCOBALAMIN) 1000 MCG tablet Take 1,000 mcg by mouth in the morning and at bedtime.    ? ?No current facility-administered medications for this visit.  ? ?Facility-Administered Medications Ordered in Other Visits  ?Medication Dose Route Frequency Provider Last Rate Last Admin  ? heparin lock flush 100 UNIT/ML injection           ? ? ?VITAL SIGNS: ?There were no vitals taken for this visit. ?There were no vitals filed for this visit.  ?Estimated body mass index is 22.95 kg/m? as calculated from the following: ?  Height as of 09/15/21: '6\' 3"'$  (1.905 m). ?  Weight as of 09/15/21: 183 lb 9.6 oz (83.3 kg). ? ?LABS: ?CBC: ?   ?Component Value Date/Time  ? WBC 5.0 08/11/2021 0855  ? HGB 13.2 08/11/2021 0855  ? HGB 13.6 03/24/2021 1406  ? HCT 38.8 (L) 08/11/2021  0855  ? HCT 41.4 03/24/2021 1406  ? PLT 454 (H) 08/11/2021 0855  ? PLT 324 03/24/2021 1406  ? MCV 97.0 08/11/2021 0855  ? MCV 93 03/24/2021 1406  ? MCV 91 05/29/2014 0917  ? NEUTROABS 2.5 08/11/2021 0855  ? NEUTROABS 1.9 05/29/2014 0917  ? LYMPHSABS 1.3 08/11/2021 0855  ? LYMPHSABS 1.2 05/29/2014 0917  ? MONOABS 1.1 (H) 08/11/2021 0855  ? MONOABS 0.3 05/29/2014 0917  ? EOSABS 0.1 08/11/2021 0855  ? EOSABS 0.1 05/29/2014 0917  ? BASOSABS 0.1 08/11/2021 0855  ? BASOSABS 0.0 05/29/2014 0917  ? ?Comprehensive Metabolic Panel: ?   ?Component Value Date/Time  ? NA 138 08/11/2021 0855  ?  NA 145 05/29/2014 0917  ? K 4.2 08/11/2021 0855  ? K 3.6 05/29/2014 0917  ? CL 97 (L) 08/11/2021 0855  ? CL 105 05/29/2014 0917  ? CO2 32 08/11/2021 0855  ? CO2 33 (H) 05/29/2014 0917  ? BUN 27 (H) 08/11/2021 0855  ? BUN 20 (H) 05/29/2014 4196  ? CREATININE 0.93 08/11/2021 0855  ? CREATININE 0.83 04/19/2019 1544  ? GLUCOSE 136 (H) 08/11/2021 0855  ? GLUCOSE 116 (H) 05/29/2014 0917  ? CALCIUM 10.0 08/11/2021 0855  ? CALCIUM 8.3 (L) 05/29/2014 0917  ? AST 37 08/11/2021 0855  ? AST 30 05/29/2014 0917  ? ALT 35 08/11/2021 0855  ? ALT 47 05/29/2014 0917  ? ALKPHOS 100 08/11/2021 0855  ? ALKPHOS 57 05/29/2014 0917  ? BILITOT 0.5 08/11/2021 0855  ? BILITOT 0.6 05/29/2014 0917  ? PROT 6.9 08/11/2021 0855  ? PROT 6.5 05/29/2014 0917  ? ALBUMIN 3.7 08/11/2021 0855  ? ALBUMIN 3.5 05/29/2014 0917  ? ? ?RADIOGRAPHIC STUDIES: ?No results found. ? ?PERFORMANCE STATUS (ECOG) : 1 - Symptomatic but completely ambulatory ? ?Review of Systems ?Unless otherwise noted, a complete review of systems is negative. ? ?Physical Exam ?General: NAD ?Cardiovascular: regular rate and rhythm ?Pulmonary: clear anterior/posterior fields ?Abdomen: soft, nontender, + bowel sounds, no rebound tenderness ?GU: no suprapubic tenderness, no hernias or inguinal masses, no CVA tenderness ?Extremities: no edema, no joint deformities ?Skin: no rashes ?Neurological: Grossly  nonfocal ? ?Assessment and Plan- Patient is a 88 y.o. male with multiple medical problems including stage Ib pancreatic cancer status post neoadjuvant chemotherapy followed by resection.  Patient is also status

## 2021-09-20 NOTE — Telephone Encounter (Signed)
Pt called stating that pt having strange pains,not sure if it it muscular pains or abdominal internally. More pain with moving. Bowels are good, urinating good. Spoke to [employer] and she says let [facility] see pt. I have spoke to [place] and she will set it up and call pt. ?

## 2021-09-21 ENCOUNTER — Encounter: Payer: Self-pay | Admitting: Oncology

## 2021-09-22 LAB — URINE CULTURE: Culture: NO GROWTH

## 2021-09-24 ENCOUNTER — Other Ambulatory Visit: Payer: Self-pay | Admitting: Hospice and Palliative Medicine

## 2021-09-24 DIAGNOSIS — G8929 Other chronic pain: Secondary | ICD-10-CM

## 2021-09-24 DIAGNOSIS — C251 Malignant neoplasm of body of pancreas: Secondary | ICD-10-CM

## 2021-09-24 NOTE — Progress Notes (Signed)
Received a call from patient that he continues to have intermittent abdominal/back pain.  He states that the pain moves around his abdomen and does not localize to any particular location.  He tried the tramadol but states that it caused constipation.  He stopped the tramadol and has liberalized his constipation medications and has had improvement with his bowel movement but continues to have intermittent pain.  Discussed with Dr. Janese Banks and we will proceed with CT of abdomen/pelvis.  ER precautions reviewed with patient. ?

## 2021-09-28 ENCOUNTER — Ambulatory Visit
Admission: RE | Admit: 2021-09-28 | Discharge: 2021-09-28 | Disposition: A | Discharge status: Home / Self Care | Payer: PPO | Source: Ambulatory Visit | Attending: Hospice and Palliative Medicine | Admitting: Hospice and Palliative Medicine

## 2021-09-28 ENCOUNTER — Encounter: Payer: Self-pay | Admitting: Oncology

## 2021-09-28 ENCOUNTER — Other Ambulatory Visit: Payer: Self-pay

## 2021-09-28 DIAGNOSIS — C259 Malignant neoplasm of pancreas, unspecified: Secondary | ICD-10-CM | POA: Diagnosis not present

## 2021-09-28 DIAGNOSIS — G8929 Other chronic pain: Secondary | ICD-10-CM | POA: Diagnosis not present

## 2021-09-28 DIAGNOSIS — K8020 Calculus of gallbladder without cholecystitis without obstruction: Secondary | ICD-10-CM | POA: Diagnosis not present

## 2021-09-28 DIAGNOSIS — C251 Malignant neoplasm of body of pancreas: Secondary | ICD-10-CM | POA: Diagnosis not present

## 2021-09-28 DIAGNOSIS — R1031 Right lower quadrant pain: Secondary | ICD-10-CM | POA: Insufficient documentation

## 2021-09-28 DIAGNOSIS — K753 Granulomatous hepatitis, not elsewhere classified: Secondary | ICD-10-CM | POA: Diagnosis not present

## 2021-09-28 DIAGNOSIS — N281 Cyst of kidney, acquired: Secondary | ICD-10-CM | POA: Diagnosis not present

## 2021-09-28 MED ORDER — IOHEXOL 300 MG/ML  SOLN
100.0000 mL | Freq: Once | INTRAMUSCULAR | Status: AC | PRN
  Administered 2021-09-28: 100 mL via INTRAVENOUS

## 2021-09-30 ENCOUNTER — Inpatient Hospital Stay (HOSPITAL_BASED_OUTPATIENT_CLINIC_OR_DEPARTMENT_OTHER): Payer: PPO | Admitting: Oncology

## 2021-09-30 ENCOUNTER — Ambulatory Visit: Payer: PPO

## 2021-09-30 DIAGNOSIS — R1013 Epigastric pain: Secondary | ICD-10-CM | POA: Diagnosis not present

## 2021-10-01 ENCOUNTER — Telehealth: Payer: Self-pay | Admitting: *Deleted

## 2021-10-01 NOTE — Telephone Encounter (Signed)
Pt called today and said his pain is worse today and he was on the video visit and Janese Banks had to leave her house for gas leak. The pt. Wanted Dr. Janese Banks to call him to go over the info that they were talking about. I sent her a message today because she if off today. She sent me message back she will call pt tom. And I called pt back to tell him she will call him . He is happy for that ?

## 2021-10-05 ENCOUNTER — Other Ambulatory Visit: Payer: Self-pay | Admitting: *Deleted

## 2021-10-05 DIAGNOSIS — R918 Other nonspecific abnormal finding of lung field: Secondary | ICD-10-CM

## 2021-10-05 DIAGNOSIS — C251 Malignant neoplasm of body of pancreas: Secondary | ICD-10-CM

## 2021-10-07 DIAGNOSIS — R918 Other nonspecific abnormal finding of lung field: Secondary | ICD-10-CM

## 2021-10-07 HISTORY — DX: Other nonspecific abnormal finding of lung field: R91.8

## 2021-10-07 NOTE — Progress Notes (Signed)
I connected with George Owens on 10/07/21 at  9:30 AM EDT by video enabled telemedicine visit and verified that I am speaking with the correct person using two identifiers. ?  ?I discussed the limitations, risks, security and privacy concerns of performing an evaluation and management service by telemedicine and the availability of in-person appointments. I also discussed with the patient that there may be a patient responsible charge related to this service. The patient expressed understanding and agreed to proceed. ? ?Other persons participating in the visit and their role in the encounter:  patients wife ? ?Patient's location:  home ?Provider's location:  home ? ?Chief Complaint:  discuss ct abdomen results ? ?History of present illness: patient is a 88 year old male with a past medical history significant for GERD, hyperlipidemia, cirrhosis.  He was having symptoms of Abdominal pain and reflux and therefore underwent a CT abdomen and pelvis with contrast on 09/11/2020 which showed a hypointense ill-defined mass in the pancreatic body up to 25 mm.  No extrapancreatic infiltrative density about the aorta or proximal [name].  No local regional adenopathy or distant metastatic disease.  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  This was followed by an EUS at [facility] which showed a hypoechoic mass measuring 1.8 x 1.6 cm in the pancreatic body.  Endosonographic borders poorly defined.  Upstream pancreatic duct dilatation.  No abnormal appearing lymph nodes.  Endosonographic imaging of the liver showed no lesion.  Biopsy showed adenocarcinoma. ?  ?PET scan showed 2.7 x 2 cm ovoid mass in the body of the pancreas with an SUV of 7.1.  18 mm hypermetabolic nodule in the isthmus of the thyroid gland.  No evidence of local regional adenopathy or distant metastatic disease. ?  ?Patient was evaluated by Dr. Hyman Hopes at [facility] for consideration of surgery and has been deemed to be upfront resectable surgical candidate.  However plan was  to offer him neoadjuvant chemotherapy for 3 months to control micrometastatic disease before proceeding with definitive surgery.  If ultimately patient decides not to proceed with surgery then radiation at [facility] also remains an option. ?  ?Patient received 8 doses of gemcitabine and Abraxane chemotherapy 1 week on 1 week off due to neutropenia and thrombocytopenia. Patient underwent laparoscopy surgery by Dr. Hyman Hopes at [facility].  Final pathology showed 3 cm grade 3 poorly differentiated adenocarcinoma of the pancreas with negative margins.  15 lymph nodes negative for malignancy.  Absent chemotherapy response with extensive residual cancer and no evidence of tumor regression.  Perineural invasion identified lymphovascular invasion not identified.  Patient completed 4 months of adjuvant gemcitabine Abraxane chemotherapy in January 2023. He is currently in remission. ? ?Interval history Patient has ongoing abdominal/ epigastric pain and back painmainly when he sits. Pain radiates around his rib cage. Denies any SOB. He is able to walk a mile comfortably. He is on PPI. Bowel movements are regular ? ? ?Review of Systems  ?Constitutional:  Negative for chills, fever, malaise/fatigue and weight loss.  ?HENT:  Negative for congestion, ear discharge and nosebleeds.   ?Eyes:  Negative for blurred vision.  ?Respiratory:  Negative for cough, hemoptysis, sputum production, shortness of breath and wheezing.   ?Cardiovascular:  Negative for chest pain, palpitations, orthopnea and claudication.  ?Gastrointestinal:  Positive for abdominal pain. Negative for blood in stool, constipation, diarrhea, heartburn, melena, nausea and vomiting.  ?Genitourinary:  Negative for dysuria, flank pain, frequency, hematuria and urgency.  ?Musculoskeletal:  Positive for back pain. Negative for joint pain and myalgias.  ?Skin:  Negative for rash.  ?Neurological:  Negative for dizziness, tingling, focal weakness, seizures, weakness and headaches.   ?Endo/Heme/Allergies:  Does not bruise/bleed easily.  ?Psychiatric/Behavioral:  Negative for depression and suicidal ideas. The patient does not have insomnia.   ? ?Allergies  ?Allergen Reactions  ? Kenalog [Triamcinolone Acetonide]   ?  Blindness X 3 Days injection  ? ? ?Past Medical History:  ?Diagnosis Date  ? Allergy   ? Aneurysm of abdominal aorta   ? Basal cell carcinoma   ? right temple Dr. Evorn Gong 12/2018  ? Chicken pox   ? Cirrhosis of liver ([place]) 05/12/2020  ? Colon polyps   ? COVID-19 virus infection 03/16/2020  ? GERD (gastroesophageal reflux disease)   ? history of prostate CA 2006  ? Hyperlipidemia   ? Leg cramps   ? Neuropathy   ? Osteoporosis   ? Pancreas cancer ([place])   ? 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 ? ?Past Surgical History:  ?Procedure Laterality Date  ? CATARACT EXTRACTION Right   ? CATARACT EXTRACTION W/PHACO Left 12/26/2017  ? Procedure: CATARACT EXTRACTION PHACO AND INTRAOCULAR LENS PLACEMENT ([place])  COMPLICATED LEFT;  Surgeon: Leandrew Koyanagi, MD;  Location: [place];  Service: Ophthalmology;  Laterality: Left;  MALYUGIN ?VISION BLUE  ? colonoscopy    ? ESOPHAGOGASTRODUODENOSCOPY (EGD) WITH PROPOFOL    ? EYE MUSCLE SURGERY Left   ? PORTA CATH INSERTION N/A 10/19/2020  ? Procedure: PORTA CATH INSERTION;  Surgeon: Algernon Huxley, MD;  Location: [place] [facility];  Service: Cardiovascular;  Laterality: N/A;  ? TONSILLECTOMY AND ADENOIDECTOMY    ? ? ?Social History  ? ?Socioeconomic History  ? Marital status: Married  ?  Spouse name: [name]  ? Number of children: 4  ? Years of education: Not on file  ? Highest education level: Not on file  ?Occupational History  ? Occupation: retired  ?Tobacco Use  ? Smoking status: Never  ? Smokeless tobacco: Never  ?Vaping Use  ? Vaping Use: Never used  ?Substance and Sexual Activity  ? Alcohol use: No  ? Drug use: No  ? Sexual activity: Yes  ?  Partners: Female  ?Other Topics Concern  ? Not on file  ?Social History Narrative  ? Lives with wife  in a 5 story retirement home.  He lives on the first floor.  4 year college degree.  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    ? ?Social Determinants of Health  ? ?Financial Resource Strain: Not on file  ?Food Insecurity: Not on file  ?Transportation Needs: Not on file  ?Physical Activity: Not on file  ?Stress: Not on file  ?Social Connections: Not on file  ?Intimate Partner Violence: Not on file  ? ? ?Family History  ?Problem Relation Age of Onset  ? Stroke Mother   ? Arthritis Father   ? Heart disease Father   ? Brain cancer Brother   ?     1/2 brother (possibly related to mother NOT related to patient)  ? Colon cancer Neg Hx   ? Esophageal cancer Neg Hx   ? Pancreatic cancer Neg Hx   ? Stomach cancer Neg Hx   ? Liver disease Neg Hx   ? ? ? ?Current Outpatient Medications:  ?  acetaminophen (TYLENOL) 325 MG tablet, Take 650 mg by mouth every 6 (six) hours as needed., Disp: , Rfl:  ?  Calcium Carb-Cholecalciferol (CALCIUM 1000 + D PO), Take 1 tablet by mouth 2 (two)  times daily., Disp: , Rfl:  ?  Cholecalciferol 50 MCG (2000 UT) CAPS, Take 1 capsule by mouth daily., Disp: , Rfl:  ?  Cranberry 1000 MG CAPS, Take 2 tablets by mouth daily., Disp: , Rfl:  ?  CREON 36000-114000 units CPEP capsule, Take 36,000 Units by mouth. 1 pills with meal, o with  pill with snack, Disp: , Rfl:  ?  Denosumab (PROLIA [place]), Inject into the skin every 6 (six) months., Disp: , Rfl:  ?  famotidine (PEPCID) 10 MG tablet, Take 10 mg by mouth. prn, Disp: , Rfl:  ?  glucosamine-chondroitin 500-400 MG tablet, Take 1 tablet by mouth daily., Disp: , Rfl:  ?  hydrocortisone (ANUSOL-HC) 2.5 % rectal cream, Place 1 application rectally 2 (two) times daily., Disp: 30 g, Rfl: 0 ?  lidocaine-prilocaine (EMLA) cream, Apply to affected area once, Disp: 30 g, Rfl: 3 ?  loratadine (CLARITIN) 10 MG tablet, Take 10 mg by mouth daily as needed., Disp: , Rfl:  ?  metoprolol succinate (TOPROL-XL) 50 MG 24 hr tablet, Take 1.5 tablets  (75 mg total) by mouth daily. (Patient taking differently: Take 50 mg by mouth daily.), Disp: 30 tablet, Rfl: 1 ?  omeprazole (PRILOSEC) 40 MG capsule, Take 40 mg by mouth daily., Disp: , Rfl:  ?  [place]

## 2021-10-08 ENCOUNTER — Ambulatory Visit
Admission: RE | Admit: 2021-10-08 | Discharge: 2021-10-08 | Disposition: A | Discharge status: Home / Self Care | Payer: PPO | Source: Ambulatory Visit | Attending: Oncology | Admitting: Oncology

## 2021-10-08 DIAGNOSIS — R918 Other nonspecific abnormal finding of lung field: Secondary | ICD-10-CM | POA: Diagnosis not present

## 2021-10-08 DIAGNOSIS — C251 Malignant neoplasm of body of pancreas: Secondary | ICD-10-CM | POA: Diagnosis not present

## 2021-10-08 MED ORDER — IOHEXOL 300 MG/ML  SOLN
75.0000 mL | Freq: Once | INTRAMUSCULAR | Status: AC | PRN
  Administered 2021-10-08: 75 mL via INTRAVENOUS

## 2021-10-11 ENCOUNTER — Telehealth: Payer: Self-pay | Admitting: *Deleted

## 2021-10-11 ENCOUNTER — Other Ambulatory Visit: Payer: Self-pay | Admitting: Oncology

## 2021-10-11 ENCOUNTER — Telehealth: Payer: Self-pay | Admitting: Internal Medicine

## 2021-10-11 NOTE — Telephone Encounter (Signed)
Please set patient up to see me asap   to follow up on his pain . You can use 2 of two tomorrow's 15 minute slots  ?

## 2021-10-11 NOTE — Telephone Encounter (Signed)
Pt called today to say sthat he looked at the scan of ct chest and saw some nodules  and 1 of them said could be cancer, but he got up and took shower and had no pain. Sat down in chair to eat breakfast and the pain started and it is not as bad as it was. He is going on trip to bus ride to [place] on Monday. Of next week and he wants to see what can be done to deal with pain. ?

## 2021-10-12 ENCOUNTER — Encounter: Payer: Self-pay | Admitting: Oncology

## 2021-10-12 ENCOUNTER — Telehealth: Payer: Self-pay | Admitting: Internal Medicine

## 2021-10-12 ENCOUNTER — Ambulatory Visit (INDEPENDENT_AMBULATORY_CARE_PROVIDER_SITE_OTHER): Payer: PPO | Admitting: Internal Medicine

## 2021-10-12 ENCOUNTER — Encounter: Payer: Self-pay | Admitting: Internal Medicine

## 2021-10-12 ENCOUNTER — Ambulatory Visit (INDEPENDENT_AMBULATORY_CARE_PROVIDER_SITE_OTHER): Payer: PPO

## 2021-10-12 VITALS — BP 118/72 | HR 77 | Temp 97.7°F | Ht 75.0 in | Wt 182.2 lb

## 2021-10-12 DIAGNOSIS — R1033 Periumbilical pain: Secondary | ICD-10-CM

## 2021-10-12 DIAGNOSIS — R1011 Right upper quadrant pain: Secondary | ICD-10-CM | POA: Diagnosis not present

## 2021-10-12 DIAGNOSIS — M546 Pain in thoracic spine: Secondary | ICD-10-CM | POA: Diagnosis not present

## 2021-10-12 DIAGNOSIS — R109 Unspecified abdominal pain: Secondary | ICD-10-CM | POA: Insufficient documentation

## 2021-10-12 MED ORDER — CELECOXIB 200 MG PO CAPS: 200.0000 mg | ORAL_CAPSULE | Freq: Two times a day (BID) | ORAL | 0 refills | Status: DC

## 2021-10-12 NOTE — Progress Notes (Signed)
? ?Subjective:  ?Patient ID: George Owens, male    DOB: 1931-08-23  Age: 88 y.o. MRN: [id number] ? ?CC: The primary encounter diagnosis was Midline thoracic back pain, unspecified chronicity. Diagnoses of Colicky RUQ abdominal pain and Periumbilical abdominal pain were also pertinent to this visit. ? ? ?This visit occurred during the SARS-CoV-2 public health emergency.  Safety protocols were in place, including screening questions prior to the visit, additional usage of staff PPE, and extensive cleaning of exam room while observing appropriate contact time as indicated for disinfecting solutions.   ? ?HPI ?George Owens presents for  evaluation of persistent anterior thoracic pain that radiates to his back  ?Chief Complaint  ?Patient presents with  ? Pain  ? ?88 yr old male with history  of pancreatic cancer, completed curative therapy , GERD, and   thoracic  ascending aortic aneurysm  ? ?Started suddenly on March 31 after getting up off of the couch,  had eaten breakfast. Described pain as  severe and anterior midline superior to the umbilicus , unaccompanied by nausea dyspnea or GI disturbance.  Got up and "walked it off" (his usual 1.5 miles) Since then the pain has become intermittent and migratory and may occur on either side anteriorly, but more frequently on the left and radiates to the left scapula. [name]  He did note mid thoracic spine pain  brought on by contact with the wooden  pew at church  last Sunday,  but not with lying supine in bed. In fact it is relieved by lying in bed stretched out,  but brought on by changing position from lying to sitting and from sitting  to standing ; also by prolonged sitting and by eating (although he eats sitting down)  ? ?Recent CT's UA/cuutre,  other labs other relevant office notes regarding workup reviewed with patient  ? ?Tried using tramadol which was ineffective and constipating . Using only tylenol. 1000 mg twice daily.   Taking omeprazole 40 mg daily,  pepcid  prn.  ? ? ? ?Outpatient Medications Prior to Visit  ?Medication Sig Dispense Refill  ? acetaminophen (TYLENOL) 325 MG tablet Take 650 mg by mouth every 6 (six) hours as needed.    ? Calcium Carb-Cholecalciferol (CALCIUM 1000 + D PO) Take 1 tablet by mouth 2 (two) times daily.    ? Cholecalciferol 50 MCG (2000 UT) CAPS Take 1 capsule by mouth daily.    ? Cranberry 1000 MG CAPS Take 2 tablets by mouth daily.    ? CREON 36000-114000 units CPEP capsule Take 36,000 Units by mouth. 1 pills with meal, o with  pill with snack    ? Denosumab (PROLIA [place]) Inject into the skin every 6 (six) months.    ? famotidine (PEPCID) 10 MG tablet Take 10 mg by mouth. prn    ? glucosamine-chondroitin 500-400 MG tablet Take 1 tablet by mouth daily.    ? hydrocortisone (ANUSOL-HC) 2.5 % rectal cream Place 1 application rectally 2 (two) times daily. 30 g 0  ? lidocaine-prilocaine (EMLA) cream Apply to affected area once 30 g 3  ? loratadine (CLARITIN) 10 MG tablet Take 10 mg by mouth daily as needed.    ? metoprolol succinate (TOPROL-XL) 50 MG 24 hr tablet Take 1.5 tablets (75 mg total) by mouth daily. (Patient taking differently: Take 50 mg by mouth daily.) 30 tablet 1  ? omeprazole (PRILOSEC) 40 MG capsule Take 40 mg by mouth daily.    ? Ranibizumab (LUCENTIS IO) Inject into  the eye as needed. As needed every three months.    ? tamsulosin (FLOMAX) 0.4 MG CAPS capsule TAKE 1 CAPSULE BY MOUTH EVERY DAY AFTER SUPPER 90 capsule 1  ? temazepam (RESTORIL) 30 MG capsule TAKE 1 CAPSULE BY MOUTH AT BEDTIME AS NEEDED FOR SLEEP. 30 capsule 2  ? vitamin B-12 (CYANOCOBALAMIN) 1000 MCG tablet Take 1,000 mcg by mouth in the morning and at bedtime.    ? traMADol (ULTRAM) 50 MG tablet Take 0.5-1 tablets (25-50 mg total) by mouth every 12 (twelve) hours as needed. (Patient not taking: Reported on 10/12/2021) 10 tablet 0  ? ?Facility-Administered Medications Prior to Visit  ?Medication Dose Route Frequency Provider Last Rate Last Admin  ? heparin lock flush  100 UNIT/ML injection           ? ? ?Review of Systems; ? ?Patient denies headache, fevers, malaise, unintentional weight loss, skin rash, eye pain, sinus congestion and sinus pain, sore throat, dysphagia,  hemoptysis , cough, dyspnea, wheezing, chest pain, palpitations, orthopnea, edema,  nausea, melena, diarrhea, constipation, flank pain, dysuria, hematuria, urinary  Frequency, nocturia, numbness, tingling, seizures,  Focal weakness, Loss of consciousness,  Tremor, insomnia, depression, anxiety, and suicidal ideation.   ? ? ? ?Objective:  ?BP 118/72 (BP Location: Left Arm, Patient Position: Sitting, Cuff Size: Normal)   Pulse 77   Temp 97.7 ?F (36.5 ?C) (Oral)   Ht '6\' 3"'$  (1.905 m)   Wt 182 lb 3.2 oz (82.6 kg)   SpO2 96%   BMI 22.77 kg/m?  ? ?BP Readings from Last 3 Encounters:  ?10/12/21 118/72  ?09/20/21 132/67  ?09/15/21 127/86  ? ? ?Wt Readings from Last 3 Encounters:  ?10/12/21 182 lb 3.2 oz (82.6 kg)  ?09/15/21 183 lb 9.6 oz (83.3 kg)  ?09/01/21 185 lb 2 oz (84 kg)  ? ? ?General appearance: alert, cooperative and appears stated age ?Ears: normal TM's and external ear canals both ears ?Throat: lips, mucosa, and tongue normal; teeth and gums normal ?Neck: no adenopathy, no carotid bruit, supple, symmetrical, trachea midline and thyroid not enlarged, symmetric, no tenderness/mass/nodules ?Back: symmetric, no curvature. ROM normal. No spinal or CVA tenderness. ?Lungs: clear to auscultation bilaterally ?Heart: regular rate and rhythm, S1, S2 normal, no murmur, click, rub or gallop ?Abdomen: soft, tender to deep palpation in RUQ ; bowel sounds normal; no masses,  no organomegaly ?Pulses: 2+ and symmetric ?Skin: Skin color, texture, turgor normal. No rashes or lesions ?Lymph nodes: Cervical, supraclavicular, and axillary nodes normal. ? ?No results found for: HGBA1C ? ?Lab Results  ?Component Value Date  ? CREATININE 0.77 09/20/2021  ? CREATININE 0.93 08/11/2021  ? CREATININE 0.88 07/20/2021  ? ? ?Lab Results   ?Component Value Date  ? WBC 7.3 09/20/2021  ? HGB 13.6 09/20/2021  ? HCT 39.6 09/20/2021  ? PLT 249 09/20/2021  ? GLUCOSE 142 (H) 09/20/2021  ? CHOL 194 09/15/2021  ? TRIG 130.0 09/15/2021  ? HDL 53.70 09/15/2021  ? LDLDIRECT 129.0 03/31/2017  ? LDLCALC 115 (H) 09/15/2021  ? ALT 39 09/20/2021  ? AST 35 09/20/2021  ? NA 137 09/20/2021  ? K 4.1 09/20/2021  ? CL 103 09/20/2021  ? CREATININE 0.77 09/20/2021  ? BUN 23 09/20/2021  ? CO2 27 09/20/2021  ? TSH 5.649 (H) 07/20/2021  ? PSA 0.51 04/27/2020  ? INR 1.1 12/26/2020  ? ? ?CT Chest W Contrast ? ?Result Date: 10/08/2021 ?CLINICAL DATA:  Follow-up indeterminate pulmonary nodules. Pancreatic carcinoma. EXAM: CT CHEST WITH CONTRAST TECHNIQUE:  Multidetector CT imaging of the chest was performed during intravenous contrast administration. RADIATION DOSE REDUCTION: This exam was performed according to the departmental dose-optimization program which includes automated exposure control, adjustment of the mA and/or kV according to patient size and/or use of iterative reconstruction technique. CONTRAST:  60m OMNIPAQUE IOHEXOL 300 MG/ML  SOLN COMPARISON:  08/04/2021 FINDINGS: Cardiovascular: No acute findings. Stable 4.5 cm ascending thoracic aortic aneurysm. Aortic atherosclerotic calcification noted. Mediastinum/Nodes: No masses or pathologically enlarged lymph nodes identified. Lungs/Pleura: Biapical pleural-parenchymal scarring is stable. A few scattered sub-cm pulmonary nodules are again seen in both lungs. 4 mm pulmonary nodule in the left upper lobe on image 31/3 shows mild increase from 2 mm on prior exam. A 6 mm pulmonary nodule in the posteromedial right lower lobe on image 141/3 has increased from 4 mm previously. Another 5 mm nodule in the posterior right lower lobe on image 149/3 has also increased from 2 mm previously. No evidence of pulmonary consolidation or pleural effusion. Upper Abdomen: Unremarkable. Stable postop changes from distal pancreatectomy and  splenectomy. Musculoskeletal:  No suspicious bone lesions. IMPRESSION: Several tiny sub-cm bilateral pulmonary nodules show mild increase in size, largest measuring 6 mm in right lower lobe. Pulmonary metastases c

## 2021-10-12 NOTE — Telephone Encounter (Signed)
Pt called in stating if you have not already cancel the appt for nuclear medicine at [place] to leave the appt as is... Pt stated to leave him on the list and let him see what happens... Pt stated if you would like to call him back you can but there is no need for a return phone call...  ?

## 2021-10-12 NOTE — Patient Instructions (Signed)
Continue using 1000 mg tylenol every 12 hours ? ?Adding celebrex 200 mg once daily or twice daily if needed ? ? ?HIDA scan ordered (functional study of gallbladder) ? ?Plain films today of thoracic spine ? ?PT referral  ?

## 2021-10-12 NOTE — Assessment & Plan Note (Signed)
Etiology unclear.  CT scans of chest and abd reviewed.  He has known DDD of spine as well as known osteoporosis by 2019 DEXA . He has a thoracic ascending aneurysm but pain is positional and post prandial.  HIDA scan and  Thoracic films ordered.   Trial of celebrex daily/bid plus 1000 mg tylenol bid  For pain management.  Continue omeprazole 40 mg daily  ?

## 2021-10-13 ENCOUNTER — Other Ambulatory Visit: Payer: Self-pay | Admitting: Internal Medicine

## 2021-10-13 DIAGNOSIS — S22070A Wedge compression fracture of T9-T10 vertebra, initial encounter for closed fracture: Secondary | ICD-10-CM | POA: Insufficient documentation

## 2021-10-13 NOTE — Assessment & Plan Note (Signed)
MRi ordered ? ?

## 2021-10-13 NOTE — Telephone Encounter (Signed)
Pt does not know if he should cancel the nuclear medicine appt because his back xray results shows the problem. Pt stated he is not bugging the provider but he wants to make sure about the appt ?

## 2021-10-14 ENCOUNTER — Encounter (HOSPITAL_COMMUNITY): Payer: PPO

## 2021-10-14 ENCOUNTER — Encounter: Payer: Self-pay | Admitting: Oncology

## 2021-10-14 ENCOUNTER — Telehealth: Payer: Self-pay | Admitting: *Deleted

## 2021-10-14 NOTE — Telephone Encounter (Signed)
Dr. Janese Banks sent me a message that she has spoke to pt and we will change his June appt for scan and move it to middle July and then see him 2 days later. After I get it changed I will call pt and let him know. ?

## 2021-10-14 NOTE — Telephone Encounter (Signed)
Spoke with pt and he stated that he did go ahead and cancel the HIDA scan. The MRI is scheduled for Saturday.  ?

## 2021-10-15 ENCOUNTER — Encounter: Payer: Self-pay | Admitting: Internal Medicine

## 2021-10-15 ENCOUNTER — Ambulatory Visit (HOSPITAL_COMMUNITY): Payer: PPO

## 2021-10-15 ENCOUNTER — Ambulatory Visit
Admission: RE | Admit: 2021-10-15 | Discharge: 2021-10-15 | Disposition: A | Discharge status: Home / Self Care | Payer: PPO | Source: Ambulatory Visit | Attending: Internal Medicine | Admitting: Internal Medicine

## 2021-10-15 DIAGNOSIS — S22070A Wedge compression fracture of T9-T10 vertebra, initial encounter for closed fracture: Secondary | ICD-10-CM | POA: Diagnosis not present

## 2021-10-15 DIAGNOSIS — I712 Thoracic aortic aneurysm, without rupture, unspecified: Secondary | ICD-10-CM | POA: Diagnosis not present

## 2021-10-16 ENCOUNTER — Ambulatory Visit: Payer: PPO

## 2021-10-16 ENCOUNTER — Ambulatory Visit (HOSPITAL_COMMUNITY): Payer: PPO

## 2021-10-17 ENCOUNTER — Encounter: Payer: Self-pay | Admitting: Internal Medicine

## 2021-10-17 DIAGNOSIS — S22070D Wedge compression fracture of T9-T10 vertebra, subsequent encounter for fracture with routine healing: Secondary | ICD-10-CM

## 2021-10-18 ENCOUNTER — Telehealth: Payer: Self-pay

## 2021-10-18 NOTE — Telephone Encounter (Signed)
George Owens with [facility] called with MRI results.  ?

## 2021-10-19 ENCOUNTER — Encounter: Payer: Self-pay | Admitting: *Deleted

## 2021-10-19 ENCOUNTER — Telehealth: Payer: Self-pay | Admitting: Internal Medicine

## 2021-10-19 DIAGNOSIS — S22070A Wedge compression fracture of T9-T10 vertebra, initial encounter for closed fracture: Secondary | ICD-10-CM

## 2021-10-19 NOTE — Telephone Encounter (Signed)
Pt received his mychart message.  ?

## 2021-10-19 NOTE — Telephone Encounter (Signed)
Heather from [facility]  called to let office know that Dr Rudene Christians is no longer doing compression fractions. Dr Rudene Christians recommends [facility] ,Dr Juliet Rude. ?

## 2021-10-20 NOTE — Telephone Encounter (Signed)
Thank you .[name] referral has been changed  ?

## 2021-10-20 NOTE — Addendum Note (Signed)
Addended by: Crecencio Mc on: 10/20/2021 10:41 AM ? ? Modules accepted: Orders ? ?

## 2021-10-20 NOTE — Telephone Encounter (Signed)
noted 

## 2021-10-20 NOTE — Telephone Encounter (Signed)
Sent pt a mychart to let him know that referral has been changed and why. ?

## 2021-10-25 ENCOUNTER — Ambulatory Visit: Payer: PPO

## 2021-10-26 DIAGNOSIS — D225 Melanocytic nevi of trunk: Secondary | ICD-10-CM | POA: Diagnosis not present

## 2021-10-26 DIAGNOSIS — D2261 Melanocytic nevi of right upper limb, including shoulder: Secondary | ICD-10-CM | POA: Diagnosis not present

## 2021-10-26 DIAGNOSIS — L821 Other seborrheic keratosis: Secondary | ICD-10-CM | POA: Diagnosis not present

## 2021-10-26 DIAGNOSIS — L820 Inflamed seborrheic keratosis: Secondary | ICD-10-CM | POA: Diagnosis not present

## 2021-10-26 DIAGNOSIS — L570 Actinic keratosis: Secondary | ICD-10-CM | POA: Diagnosis not present

## 2021-10-26 DIAGNOSIS — X32XXXA Exposure to sunlight, initial encounter: Secondary | ICD-10-CM | POA: Diagnosis not present

## 2021-10-26 DIAGNOSIS — Z85828 Personal history of other malignant neoplasm of skin: Secondary | ICD-10-CM | POA: Diagnosis not present

## 2021-10-26 DIAGNOSIS — D2262 Melanocytic nevi of left upper limb, including shoulder: Secondary | ICD-10-CM | POA: Diagnosis not present

## 2021-10-26 DIAGNOSIS — L538 Other specified erythematous conditions: Secondary | ICD-10-CM | POA: Diagnosis not present

## 2021-10-27 DIAGNOSIS — R2689 Other abnormalities of gait and mobility: Secondary | ICD-10-CM | POA: Diagnosis not present

## 2021-10-27 DIAGNOSIS — M6281 Muscle weakness (generalized): Secondary | ICD-10-CM | POA: Diagnosis not present

## 2021-10-27 DIAGNOSIS — M5459 Other low back pain: Secondary | ICD-10-CM | POA: Diagnosis not present

## 2021-10-27 DIAGNOSIS — S22070D Wedge compression fracture of T9-T10 vertebra, subsequent encounter for fracture with routine healing: Secondary | ICD-10-CM | POA: Diagnosis not present

## 2021-11-01 ENCOUNTER — Telehealth: Payer: Self-pay

## 2021-11-01 ENCOUNTER — Other Ambulatory Visit: Payer: Self-pay | Admitting: Internal Medicine

## 2021-11-01 DIAGNOSIS — R2689 Other abnormalities of gait and mobility: Secondary | ICD-10-CM | POA: Diagnosis not present

## 2021-11-01 DIAGNOSIS — M6281 Muscle weakness (generalized): Secondary | ICD-10-CM | POA: Diagnosis not present

## 2021-11-01 DIAGNOSIS — S22070D Wedge compression fracture of T9-T10 vertebra, subsequent encounter for fracture with routine healing: Secondary | ICD-10-CM | POA: Diagnosis not present

## 2021-11-01 DIAGNOSIS — S22070A Wedge compression fracture of T9-T10 vertebra, initial encounter for closed fracture: Secondary | ICD-10-CM

## 2021-11-01 DIAGNOSIS — M5459 Other low back pain: Secondary | ICD-10-CM | POA: Diagnosis not present

## 2021-11-01 NOTE — Telephone Encounter (Signed)
Patient called back and I gave him the phone number and address for Interventional Radiology. ?

## 2021-11-01 NOTE — Telephone Encounter (Signed)
Patient states Dr. Derrel Nip referred him to a doctor for a compression fracture on his vertebra and he hasn't heard from anyone. ? ?I spoke with George Owens and showed me how to find the contact information for the referral we sent to Interventional Radiology. ? ?I lvm for patient giving him the phone number and address for Interventional Radiology. ?

## 2021-11-03 ENCOUNTER — Telehealth (HOSPITAL_COMMUNITY): Payer: Self-pay

## 2021-11-03 DIAGNOSIS — M6281 Muscle weakness (generalized): Secondary | ICD-10-CM | POA: Diagnosis not present

## 2021-11-03 DIAGNOSIS — R2689 Other abnormalities of gait and mobility: Secondary | ICD-10-CM | POA: Diagnosis not present

## 2021-11-03 DIAGNOSIS — S22070D Wedge compression fracture of T9-T10 vertebra, subsequent encounter for fracture with routine healing: Secondary | ICD-10-CM | POA: Diagnosis not present

## 2021-11-03 DIAGNOSIS — M5459 Other low back pain: Secondary | ICD-10-CM | POA: Diagnosis not present

## 2021-11-03 NOTE — Telephone Encounter (Signed)
Ok per [employer] for T9 KP. AW  ?

## 2021-11-04 ENCOUNTER — Telehealth (HOSPITAL_COMMUNITY): Payer: Self-pay

## 2021-11-04 ENCOUNTER — Other Ambulatory Visit (HOSPITAL_COMMUNITY): Payer: Self-pay | Admitting: Interventional Radiology

## 2021-11-04 NOTE — Telephone Encounter (Signed)
Faxed biopsy request to Dr. Derrel Nip. AW

## 2021-11-04 NOTE — Telephone Encounter (Signed)
Called to let the pt know that we have received the referral and have sent a request to the insurance company for approval. I will call to schedule once we receive approval. He agreed with this plan. AW

## 2021-11-08 ENCOUNTER — Other Ambulatory Visit: Payer: PPO

## 2021-11-08 DIAGNOSIS — S22070D Wedge compression fracture of T9-T10 vertebra, subsequent encounter for fracture with routine healing: Secondary | ICD-10-CM | POA: Diagnosis not present

## 2021-11-08 DIAGNOSIS — M6281 Muscle weakness (generalized): Secondary | ICD-10-CM | POA: Diagnosis not present

## 2021-11-08 DIAGNOSIS — R2689 Other abnormalities of gait and mobility: Secondary | ICD-10-CM | POA: Diagnosis not present

## 2021-11-08 DIAGNOSIS — M5459 Other low back pain: Secondary | ICD-10-CM | POA: Diagnosis not present

## 2021-11-09 ENCOUNTER — Other Ambulatory Visit (HOSPITAL_COMMUNITY): Payer: Self-pay | Admitting: Interventional Radiology

## 2021-11-09 DIAGNOSIS — S22070A Wedge compression fracture of T9-T10 vertebra, initial encounter for closed fracture: Secondary | ICD-10-CM

## 2021-11-10 ENCOUNTER — Other Ambulatory Visit: Payer: Self-pay | Admitting: Student

## 2021-11-10 DIAGNOSIS — R2689 Other abnormalities of gait and mobility: Secondary | ICD-10-CM | POA: Diagnosis not present

## 2021-11-10 DIAGNOSIS — M5459 Other low back pain: Secondary | ICD-10-CM | POA: Diagnosis not present

## 2021-11-10 DIAGNOSIS — M6281 Muscle weakness (generalized): Secondary | ICD-10-CM | POA: Diagnosis not present

## 2021-11-10 DIAGNOSIS — S22070A Wedge compression fracture of T9-T10 vertebra, initial encounter for closed fracture: Secondary | ICD-10-CM

## 2021-11-10 DIAGNOSIS — S22070D Wedge compression fracture of T9-T10 vertebra, subsequent encounter for fracture with routine healing: Secondary | ICD-10-CM | POA: Diagnosis not present

## 2021-11-10 NOTE — H&P (Signed)
Chief Complaint: Patient was seen in consultation today for T9 compression fracture   Referring Physician(s): Deveshwar,Sanjeev  Supervising Physician: Luanne Bras  Patient Status: [facility] - Out-pt  History of Present Illness: George Owens is an 88 y.o. male with a medical history significant for pancreatic cancer (curative therapy complete), AAA, cirrhosis, and mid-thoracic back pain that started suddenly September 17, 2021 after getting up off of the couch. He was evaluated by his PCP and imaging obtained showed a T9 compression fracture.   MR Thoracic Spine without contrast 10/15/21 IMPRESSION: Acute or subacute 2 column compression fracture of T9 with approximately 50% vertebral body height loss, which is new since 08/04/2021. No spinal canal stenosis. Mild left neural foraminal narrowing at T9-T10.  Conservative measures were attempted including rest and OTC pain medications. The patient was ultimately referred to Neuro Interventional Radiology for possible kyphoplasty/vertebroplasty. Imaging reviewed and procedure approved by Dr. Estanislado Pandy.   The patient continues to endorse severe back pain that interferes with his ability to perform his activities of daily living.    Past Medical History:  Diagnosis Date   Allergy    Aneurysm of abdominal aorta (HCC)    Basal cell carcinoma    right temple Dr. 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    Past Surgical History:  Procedure Laterality Date   CATARACT EXTRACTION Right    CATARACT EXTRACTION W/PHACO Left 12/26/2017   Procedure: CATARACT EXTRACTION PHACO AND INTRAOCULAR LENS PLACEMENT ([place])  COMPLICATED LEFT;  Surgeon: Leandrew Koyanagi, MD;   Location: [place];  Service: Ophthalmology;  Laterality: Left;  [place]   colonoscopy     ESOPHAGOGASTRODUODENOSCOPY (EGD) WITH PROPOFOL     EYE MUSCLE SURGERY Left    PORTA CATH INSERTION N/A 10/19/2020   Procedure: PORTA CATH INSERTION;  Surgeon: Algernon Huxley, MD;  Location: [place] CV LAB;  Service: Cardiovascular;  Laterality: N/A;   TONSILLECTOMY AND ADENOIDECTOMY      Allergies: Kenalog [triamcinolone acetonide]  Medications: Prior to Admission medications   Medication Sig Start Date End Date Taking? Authorizing Provider  acetaminophen (TYLENOL) 500 MG tablet Take 1,000 mg by mouth every 6 (six) hours as needed for moderate pain.   Yes [provider]  Calcium Carb-Cholecalciferol (CALCIUM 600 + D PO) Take 1 tablet by mouth daily.   Yes [provider]  cholecalciferol (VITAMIN D3) 25 MCG (1000 UNIT) tablet Take 1,000 Units by mouth daily.   Yes [provider]  Cranberry 500 MG CAPS Take 500 mg by mouth daily.   Yes [provider]  CREON 36000-114000 units CPEP capsule Take 36,000 Units by mouth See admin instructions. Take 36000 units once daily with the largest meal of the day 01/08/21  Yes [provider]  Denosumab (PROLIA [place]) Inject 1 Dose into the skin every 6 (six) months.   Yes [provider]  famotidine (PEPCID) 20 MG tablet Take 20 mg by mouth daily as needed for heartburn or indigestion.   Yes [provider]  hydrocortisone (ANUSOL-HC) 2.5 % rectal cream Place 1 application rectally 2 (two) times daily. Patient taking differently: Place 1 application.  rectally 2 (two) times daily as needed for hemorrhoids. 03/24/21  Yes Vanga, Tally Due, MD  lidocaine-prilocaine (EMLA) cream Apply to affected area once Patient taking differently: Apply 1 application. topically daily as needed (port access). 06/08/21  Yes Verlon Au, NP  loratadine (CLARITIN) 10 MG tablet Take 10 mg by  mouth daily as needed for allergies.   Yes [provider]  metoprolol succinate (TOPROL-XL) 50 MG 24 hr tablet Take 1.5 tablets (75 mg total) by mouth daily. Patient taking differently: Take 50 mg by mouth daily. 04/06/21  Yes Sindy Guadeloupe, MD  Misc Natural Products [facility]) CAPS Take 1 capsule by mouth daily.   Yes [provider]  omeprazole (PRILOSEC) 40 MG capsule Take 40 mg by mouth daily.   Yes [provider]  Polyethyl Glycol-Propyl Glycol (SYSTANE OP) Place 1 drop into both eyes daily.   Yes [provider]  Ranibizumab (LUCENTIS IO) Place 1 Dose into the right eye See admin instructions. Every 14 weeks   Yes [provider]  tamsulosin (FLOMAX) 0.4 MG CAPS capsule TAKE 1 CAPSULE BY MOUTH EVERY DAY AFTER SUPPER 10/12/21  Yes Sindy Guadeloupe, MD  temazepam (RESTORIL) 30 MG capsule TAKE 1 CAPSULE BY MOUTH AT BEDTIME AS NEEDED FOR SLEEP. Patient taking differently: Take 30 mg by mouth at bedtime. 09/21/21  Yes Crecencio Mc, MD  vitamin B-12 (CYANOCOBALAMIN) 1000 MCG tablet Take 1,000 mcg by mouth daily.   Yes [provider]  celecoxib (CELEBREX) 200 MG capsule Take 1 capsule (200 mg total) by mouth 2 (two) times daily. Patient not taking: Reported on 11/09/2021 10/12/21   Crecencio Mc, MD     Family History  Problem Relation Age of Onset   Stroke Mother    Arthritis Father    Heart disease Father    Brain cancer Brother        1/2 brother (possibly related to mother NOT related to patient)   Colon cancer Neg Hx    Esophageal cancer Neg Hx    Pancreatic cancer Neg Hx    Stomach cancer Neg Hx    Liver disease Neg Hx     Social History   Socioeconomic History   Marital status: Married    Spouse name: Eloise   Number of children: 4   Years of education: Not on file   Highest education level: Not on file  Occupational History   Occupation: retired  Tobacco Use   Smoking status: Never   Smokeless tobacco: Never   Vaping Use   Vaping Use: Never used  Substance and Sexual Activity   Alcohol use: No   Drug use: No   Sexual activity: Yes    Partners: Female  Other Topics Concern   Not on file  Social History Narrative   Lives with wife in a 5 story retirement home.  He lives on the first floor.  4 year college degree.  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     Social Determinants of Health   Financial Resource Strain: Not on file  Food Insecurity: Not on file  Transportation Needs: Not on file  Physical Activity: Not on file  Stress: Not on file  Social Connections: Not on file    Review of Systems: A 12 point ROS discussed and pertinent positives are indicated in the HPI above.  All other systems are negative.  Review of Systems  Constitutional:  Negative for appetite change and  fatigue.  Respiratory:  Negative for cough and shortness of breath.   Cardiovascular:  Negative for chest pain and leg swelling.  Gastrointestinal:  Negative for abdominal pain, diarrhea, nausea and vomiting.  Musculoskeletal:  Positive for back pain.  Neurological:  Negative for dizziness and headaches.   Vital Signs: BP 120/63   Pulse 68   Temp 98.1 F (36.7 C) (Oral)   Resp 18   Ht '6\' 3"'$  (1.905 m)   Wt 182 lb (82.6 kg)   SpO2 99%   BMI 22.75 kg/m   Physical Exam Constitutional:      General: He is not in acute distress.    Appearance: He is not ill-appearing.  HENT:     Mouth/Throat:     Mouth: Mucous membranes are moist.     Pharynx: Oropharynx is clear.  Cardiovascular:     Rate and Rhythm: Normal rate and regular rhythm.  Pulmonary:     Effort: Pulmonary effort is normal.     Breath sounds: Normal breath sounds.  Abdominal:     General: Bowel sounds are normal.     Palpations: Abdomen is soft.     Tenderness: There is no abdominal tenderness.  Musculoskeletal:     Right lower leg: No edema.     Left lower leg: No edema.  Skin:    General: Skin is warm  and dry.  Neurological:     Mental Status: He is alert and oriented to person, place, and time.    Imaging: DG Thoracic Spine W/Swimmers  Result Date: 10/13/2021 CLINICAL DATA:  Midthoracic pain anteriorly with radiation to low back. EXAM: THORACIC SPINE - 3 VIEWS COMPARISON:  CT chest 10/08/2021. CT chest, abdomen and pelvis 08/04/2021. FINDINGS: The patient has a biconcave compression fracture of T9 with vertebral body height loss of up to 80%. There is minimal bony retropulsion off the posterior endplate. The fracture is seen on the 10/08/2021 exam there is not present on the 08/04/2021 study. There is no other fracture. Alignment is maintained. No lytic or sclerotic lesion is identified. IMPRESSION: Acute or early subacute compression fracture of T9 with vertebral body height loss of up to 80% centrally and minimal bony retropulsion off the posterior aspect of T9. Electronically Signed   By: Inge Rise M.D.   On: 10/13/2021 08:07   MR Thoracic Spine Wo Contrast  Result Date: 10/17/2021 CLINICAL DATA:  Mid back pain, compression fracture suspected EXAM: MRI THORACIC SPINE WITHOUT CONTRAST TECHNIQUE: Multiplanar, multisequence MR imaging of the thoracic spine was performed. No intravenous contrast was administered. COMPARISON:  No prior MRI, correlation is made with thoracic spine radiographs 10/12/2021, CT chest 10/08/2021, and CT chest abdomen pelvis 08/04/2021 FINDINGS: Alignment: No listhesis. Preservation of the normal thoracic kyphosis. Vertebrae: Increased T2 signal throughout the T9 vertebral body, extending into the bilateral pedicles, compatible with 2 column acute or subacute fracture. This fracture was present on the 10/08/2021 chest CT and was partially visualized on the 09/28/2021 CT abdomen pelvis, but was not present on the 08/04/2021 CT chest abdomen pelvis. 5 mm retropulsion of the posterior cortex. Approximately 50% vertebral body height loss. The remainder of the vertebral  bodies demonstrate no acute fracture or suspicious osseous lesion. Cord:  Normal signal and morphology. Paraspinal and other soft tissues: Ascending thoracic aortic aneurysm, better evaluated on the 10/08/2021 chest CT. Otherwise negative. Disc levels: No spinal canal stenosis. Mild left neural foraminal narrowing at T9-T10. IMPRESSION: Acute or subacute 2 column compression fracture of T9 with approximately  50% vertebral body height loss, which is new since 08/04/2021. No spinal canal stenosis. Mild left neural foraminal narrowing at T9-T10. These results will be called to the ordering clinician or representative by the Radiologist Assistant, and communication documented in the PACS or [employer]. Electronically Signed   By: Merilyn Baba M.D.   On: 10/17/2021 02:55    Labs:  CBC: Recent Labs    07/20/21 0822 08/11/21 0855 09/20/21 1510 11/11/21 0700  WBC 3.8* 5.0 7.3 6.1  HGB 12.5* 13.2 13.6 14.6  HCT 36.7* 38.8* 39.6 42.8  PLT 292 454* 249 362    COAGS: Recent Labs    12/26/20 2004 11/11/21 0700  INR 1.1 1.0    BMP: Recent Labs    07/06/21 0857 07/20/21 0822 08/11/21 0855 09/20/21 1510  NA 136 138 138 137  K 3.9 3.6 4.2 4.1  CL 101 101 97* 103  CO2 28 29 32 27  GLUCOSE 171* 169* 136* 142*  BUN 25* 22 27* 23  CALCIUM 9.2 9.7 10.0 8.2*  CREATININE 0.80 0.88 0.93 0.77  GFRNONAA >60 >60 >60 >60    LIVER FUNCTION TESTS: Recent Labs    07/06/21 0857 07/20/21 0822 08/11/21 0855 09/20/21 1510  BILITOT 0.7 0.7 0.5 1.1  AST 44* 45* 37 35  ALT 41 39 35 39  ALKPHOS 104 89 100 101  PROT 6.7 6.9 6.9 6.4*  ALBUMIN 3.8 3.8 3.7 3.9    TUMOR MARKERS: No results for input(s): AFPTM, CEA, CA199, CHROMGRNA in the last 8760 hours.  Assessment and Plan:  T9 compression fracture; acute back pain: [name], 88 year old male, presents today to the [facility] Neuro Interventional Radiology department for a T9 kyphoplasty/vertebroplasty with bone biopsy.   Risks  and benefits of T9 kyphoplasty/vertebroplasty were discussed with the patient including, but not limited to education regarding the natural healing process of compression fractures without intervention, bleeding, infection, cement migration which may cause spinal cord damage, paralysis, pulmonary embolism or even death. This interventional procedure involves the use of X-rays and because of the nature of the planned procedure, it is possible that we will have prolonged use of X-ray fluoroscopy. Potential radiation risks to you include (but are not limited to) the following: - A slightly elevated risk for cancer  several years later in life. This risk is typically less than 0.5% percent. This risk is low in comparison to the normal incidence of human cancer, which is 33% for women and 50% for men according to the [place]. - Radiation induced injury can include skin redness, resembling a rash, tissue breakdown / ulcers and hair loss (which can be temporary or permanent).  The likelihood of either of these occurring depends on the difficulty of the procedure and whether you are sensitive to radiation due to previous procedures, disease, or genetic conditions.  IF your procedure requires a prolonged use of radiation, you will be notified and given written instructions for further action.  It is your responsibility to monitor the irradiated area for the 2 weeks following the procedure and to notify your physician if you are concerned that you have suffered a radiation induced injury.    Risks and benefits of bone biopsy were discussed with the patient and/or patient's family including, but not limited to bleeding, infection, damage to adjacent structures or low yield requiring additional tests.  All of the patient's questions were answered, patient is agreeable to proceed. He has been NPO. He does not take any blood-thinning medications.  Consent signed and in chart.  Thank you for this  interesting consult.  I greatly enjoyed meeting George Owens and look forward to participating in their care.  A copy of this report was sent to the requesting provider on this date.  Electronically Signed: Soyla Dryer, AGACNP-BC [phone] 11/11/2021, 7:45 AM   I spent a total of  30 Minutes   in face to face in clinical consultation, greater than 50% of which was counseling/coordinating care for T9 kyphoplasty/vertebroplasty

## 2021-11-11 ENCOUNTER — Ambulatory Visit (HOSPITAL_COMMUNITY)
Admission: RE | Admit: 2021-11-11 | Discharge: 2021-11-11 | Disposition: A | Discharge status: Home / Self Care | Payer: PPO | Source: Ambulatory Visit | Attending: Interventional Radiology | Admitting: Interventional Radiology

## 2021-11-11 ENCOUNTER — Other Ambulatory Visit: Payer: Self-pay

## 2021-11-11 DIAGNOSIS — X58XXXA Exposure to other specified factors, initial encounter: Secondary | ICD-10-CM | POA: Diagnosis not present

## 2021-11-11 DIAGNOSIS — S22070A Wedge compression fracture of T9-T10 vertebra, initial encounter for closed fracture: Secondary | ICD-10-CM | POA: Diagnosis not present

## 2021-11-11 DIAGNOSIS — M549 Dorsalgia, unspecified: Secondary | ICD-10-CM | POA: Insufficient documentation

## 2021-11-11 DIAGNOSIS — Z8546 Personal history of malignant neoplasm of prostate: Secondary | ICD-10-CM | POA: Insufficient documentation

## 2021-11-11 DIAGNOSIS — M4854XA Collapsed vertebra, not elsewhere classified, thoracic region, initial encounter for fracture: Secondary | ICD-10-CM | POA: Diagnosis not present

## 2021-11-11 HISTORY — PX: IR KYPHO THORACIC WITH BONE BIOPSY: IMG5518

## 2021-11-11 LAB — PROTIME-INR
INR: 1 (ref 0.8–1.2)
Prothrombin Time: 13.5 seconds (ref 11.4–15.2)

## 2021-11-11 LAB — CBC
HCT: 42.8 % (ref 39.0–52.0)
Hemoglobin: 14.6 g/dL (ref 13.0–17.0)
MCH: 31 pg (ref 26.0–34.0)
MCHC: 34.1 g/dL (ref 30.0–36.0)
MCV: 90.9 fL (ref 80.0–100.0)
Platelets: 362 10*3/uL (ref 150–400)
RBC: 4.71 MIL/uL (ref 4.22–5.81)
RDW: 14.5 % (ref 11.5–15.5)
WBC: 6.1 10*3/uL (ref 4.0–10.5)
nRBC: 0 % (ref 0.0–0.2)

## 2021-11-11 LAB — POCT I-STAT, CHEM 8
BUN: 20 mg/dL (ref 8–23)
Calcium, Ion: 1.12 mmol/L — ABNORMAL LOW (ref 1.15–1.40)
Chloride: 101 mmol/L (ref 98–111)
Creatinine, Ser: 0.9 mg/dL (ref 0.61–1.24)
Glucose, Bld: 107 mg/dL — ABNORMAL HIGH (ref 70–99)
HCT: 39 % (ref 39.0–52.0)
Hemoglobin: 13.3 g/dL (ref 13.0–17.0)
Potassium: 3.6 mmol/L (ref 3.5–5.1)
Sodium: 143 mmol/L (ref 135–145)
TCO2: 28 mmol/L (ref 22–32)

## 2021-11-11 MED ORDER — CEFAZOLIN SODIUM-DEXTROSE 2-4 GM/100ML-% IV SOLN
INTRAVENOUS | Status: AC
  Filled 2021-11-11: qty 100

## 2021-11-11 MED ORDER — MIDAZOLAM HCL 2 MG/2ML IJ SOLN
INTRAMUSCULAR | Status: AC | PRN
  Administered 2021-11-11 (×2): 1 mg via INTRAVENOUS

## 2021-11-11 MED ORDER — SODIUM CHLORIDE 0.9 % IV SOLN: INTRAVENOUS | Status: AC

## 2021-11-11 MED ORDER — FENTANYL CITRATE (PF) 100 MCG/2ML IJ SOLN
INTRAMUSCULAR | Status: AC
  Filled 2021-11-11: qty 2

## 2021-11-11 MED ORDER — FENTANYL CITRATE (PF) 100 MCG/2ML IJ SOLN
INTRAMUSCULAR | Status: AC | PRN
  Administered 2021-11-11 (×2): 25 ug via INTRAVENOUS

## 2021-11-11 MED ORDER — BUPIVACAINE HCL (PF) 0.5 % IJ SOLN
INTRAMUSCULAR | Status: AC | PRN
  Administered 2021-11-11: 10 mL

## 2021-11-11 MED ORDER — MIDAZOLAM HCL 2 MG/2ML IJ SOLN
INTRAMUSCULAR | Status: AC
  Filled 2021-11-11: qty 2

## 2021-11-11 MED ORDER — SODIUM CHLORIDE 0.9 % IV SOLN
INTRAVENOUS | Status: DC
Start: 2021-11-11 — End: 2021-11-12

## 2021-11-11 MED ORDER — TOBRAMYCIN SULFATE 1.2 G IJ SOLR
INTRAMUSCULAR | Status: AC
  Filled 2021-11-11: qty 1.2

## 2021-11-11 MED ORDER — CEFAZOLIN SODIUM-DEXTROSE 2-4 GM/100ML-% IV SOLN
2.0000 g | INTRAVENOUS | Status: AC
  Administered 2021-11-11: 2 g via INTRAVENOUS

## 2021-11-11 MED ORDER — BUPIVACAINE HCL (PF) 0.5 % IJ SOLN
INTRAMUSCULAR | Status: AC
  Filled 2021-11-11: qty 30

## 2021-11-11 NOTE — Procedures (Signed)
INR. Status post T9 balloon kyphoplasty with core biopsy.  Right transpedicular approach.  S.Harlyn Italiano MD

## 2021-11-11 NOTE — Discharge Instructions (Signed)
INR.  1.  No stooping, bending, or lifting weights over 10 pounds for 2 weeks.  2..  Use a walker to ambulate for 2 weeks.  3.  No driving for 2 weeks.   4.  RTC as needed 2 weeks.

## 2021-11-12 LAB — SURGICAL PATHOLOGY

## 2021-11-22 NOTE — Telephone Encounter (Signed)
Pt called stating he is still having vertebra pain after he has already did the scan

## 2021-11-22 NOTE — Telephone Encounter (Signed)
LMTCB

## 2021-11-22 NOTE — Progress Notes (Signed)
Disabling back pain due to compression fracture at T9. S.Kla Bily MD

## 2021-11-22 NOTE — Telephone Encounter (Signed)
Spoke with pt and he stated that he is still having pain in his back. He received the injection from Interventional Radiology 11 days ago. Pt would like to know what to do since he is still having pain.

## 2021-11-23 ENCOUNTER — Encounter: Payer: Self-pay | Admitting: Internal Medicine

## 2021-11-23 ENCOUNTER — Telehealth (INDEPENDENT_AMBULATORY_CARE_PROVIDER_SITE_OTHER): Payer: PPO | Admitting: Internal Medicine

## 2021-11-23 VITALS — BP 122/68 | HR 72 | Ht 75.0 in | Wt 180.0 lb

## 2021-11-23 DIAGNOSIS — M546 Pain in thoracic spine: Secondary | ICD-10-CM | POA: Diagnosis not present

## 2021-11-23 DIAGNOSIS — E785 Hyperlipidemia, unspecified: Secondary | ICD-10-CM | POA: Diagnosis not present

## 2021-11-23 DIAGNOSIS — S22070A Wedge compression fracture of T9-T10 vertebra, initial encounter for closed fracture: Secondary | ICD-10-CM | POA: Diagnosis not present

## 2021-11-23 DIAGNOSIS — I480 Paroxysmal atrial fibrillation: Secondary | ICD-10-CM | POA: Diagnosis not present

## 2021-11-23 DIAGNOSIS — I7121 Aneurysm of the ascending aorta, without rupture: Secondary | ICD-10-CM | POA: Diagnosis not present

## 2021-11-23 MED ORDER — TRAMADOL HCL 50 MG PO TABS: 50.0000 mg | ORAL_TABLET | Freq: Two times a day (BID) | ORAL | 0 refills | Status: DC | PRN

## 2021-11-23 NOTE — Patient Instructions (Signed)
I recommend taking a scheduled dose of tylenol ,  1000 mg.  Every 12 hours  You can add the tramadol 50 mg  if the pain is not well controlled with the tylenol ,  maximum daily dose is every 6 hours  or 4 daily

## 2021-11-23 NOTE — Progress Notes (Signed)
Virtual Visit via [place] Note  This visit type was conducted due to national recommendations for restrictions regarding the COVID-19 pandemic (e.g. social distancing).  This format is felt to be most appropriate for this patient at this time.  All issues noted in this document were discussed and addressed.  No physical exam was performed (except for noted visual exam findings with Video Visits).   I connected withNAME@ on 11/23/21 at  4:30 PM EDT by a video enabled telemedicine application  and verified that I am speaking with the correct person using two identifiers. Location patient: home Location provider: work or home office Persons participating in the virtual visit: patient, provider  I discussed the limitations, risks, security and privacy concerns of performing an evaluation and management service by telephone and the availability of in person appointments. I also discussed with the patient that there may be a patient responsible charge related to this service. The patient expressed understanding and agreed to proceed.  Reason for visit: persistent back pain  HPI:  88 yr old male with osteoporosis,  recent T9 compression fracture, nontraumatic with 50% loss of height, diagnosed on April 26 ,  treated with kyphoplasty by Interventional Radiology on May 26 without immediate complications , presents for management of persistent pain .  He rates that pain as 3-4 currently,  but has episodes of increased pain rated as 7-8 when sitting in a hardbacked chair. And the pain at that time will wrap around to his sternum .  He is concerned about the significance of his persistent pain.    ROS: See pertinent positives and negatives per HPI.  Past Medical History:  Diagnosis Date   Allergy    Aneurysm of abdominal aorta (HCC)    Basal cell carcinoma    right temple Dr. 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    Past Surgical History:  Procedure Laterality Date   CATARACT EXTRACTION Right    CATARACT EXTRACTION W/PHACO Left 12/26/2017   Procedure: CATARACT EXTRACTION PHACO AND INTRAOCULAR LENS PLACEMENT ([place])  COMPLICATED LEFT;  Surgeon: Leandrew Koyanagi, MD;  Location: [place];  Service: Ophthalmology;  Laterality: Left;  [place]   colonoscopy     ESOPHAGOGASTRODUODENOSCOPY (EGD) WITH PROPOFOL     EYE MUSCLE SURGERY Left    IR KYPHO THORACIC WITH BONE BIOPSY  11/11/2021   PORTA CATH INSERTION N/A 10/19/2020   Procedure: PORTA CATH INSERTION;  Surgeon: Algernon Huxley, MD;  Location: [place] CV LAB;  Service: Cardiovascular;  Laterality: N/A;   TONSILLECTOMY AND ADENOIDECTOMY      Family History  Problem Relation Age of Onset   Stroke Mother    Arthritis Father    Heart disease Father    Brain cancer Brother        1/2 brother (possibly related to mother NOT related to patient)   Colon cancer Neg Hx    Esophageal cancer Neg Hx    Pancreatic cancer Neg Hx    Stomach cancer Neg Hx    Liver disease Neg Hx     SOCIAL HX:  reports that he has never smoked. He has never  used smokeless tobacco. He reports that he does not drink alcohol and does not use drugs.    Current Outpatient Medications:    acetaminophen (TYLENOL) 500 MG tablet, Take 1,000 mg by mouth every 6 (six) hours as needed for moderate pain., Disp: , Rfl:    Calcium Carb-Cholecalciferol (CALCIUM 600 + D PO), Take 1 tablet by mouth daily., Disp: , Rfl:    cholecalciferol (VITAMIN D3) 25 MCG (1000 UNIT) tablet, Take 1,000 Units by mouth daily., Disp: , Rfl:    Cranberry 500 MG CAPS, Take 500 mg by mouth daily., Disp: , Rfl:    CREON 36000-114000 units CPEP capsule, Take 36,000 Units by mouth See admin  instructions. Take 36000 units once daily with the largest meal of the day, Disp: , Rfl:    Denosumab (PROLIA [place]), Inject 1 Dose into the skin every 6 (six) months., Disp: , Rfl:    famotidine (PEPCID) 20 MG tablet, Take 20 mg by mouth daily as needed for heartburn or indigestion., Disp: , Rfl:    hydrocortisone (ANUSOL-HC) 2.5 % rectal cream, Place 1 application rectally 2 (two) times daily. (Patient taking differently: Place 1 application. rectally 2 (two) times daily as needed for hemorrhoids.), Disp: 30 g, Rfl: 0   lidocaine-prilocaine (EMLA) cream, Apply to affected area once (Patient taking differently: Apply 1 application. topically daily as needed (port access).), Disp: 30 g, Rfl: 3   loratadine (CLARITIN) 10 MG tablet, Take 10 mg by mouth daily as needed for allergies., Disp: , Rfl:    metoprolol succinate (TOPROL-XL) 50 MG 24 hr tablet, Take 1.5 tablets (75 mg total) by mouth daily. (Patient taking differently: Take 50 mg by mouth daily.), Disp: 30 tablet, Rfl: 1   Misc Natural Products ([facility]) CAPS, Take 1 capsule by mouth daily., Disp: , Rfl:    omeprazole (PRILOSEC) 40 MG capsule, Take 40 mg by mouth daily., Disp: , Rfl:    Polyethyl Glycol-Propyl Glycol (SYSTANE OP), Place 1 drop into both eyes daily., Disp: , Rfl:    Ranibizumab (LUCENTIS IO), Place 1 Dose into the right eye See admin instructions. Every 14 weeks, Disp: , Rfl:    tamsulosin (FLOMAX) 0.4 MG CAPS capsule, TAKE 1 CAPSULE BY MOUTH EVERY DAY AFTER SUPPER, Disp: 90 capsule, Rfl: 1   temazepam (RESTORIL) 30 MG capsule, TAKE 1 CAPSULE BY MOUTH AT BEDTIME AS NEEDED FOR SLEEP. (Patient taking differently: Take 30 mg by mouth at bedtime.), Disp: 30 capsule, Rfl: 2   traMADol (ULTRAM) 50 MG tablet, Take 1 tablet (50 mg total) by mouth every 12 (twelve) hours as needed., Disp: 60 tablet, Rfl: 0   vitamin B-12 (CYANOCOBALAMIN) 1000 MCG tablet, Take 1,000 mcg by mouth daily., Disp: , Rfl:  No current facility-administered  medications for this visit.  Facility-Administered Medications Ordered in Other Visits:    heparin lock flush 100 UNIT/ML injection, , , ,   EXAM:  VITALS per patient if applicable:  GENERAL: alert, oriented, appears well and in no acute distress  HEENT: atraumatic, conjunttiva clear, no obvious abnormalities on inspection of external nose and ears  NECK: normal movements of the head and neck  LUNGS: on inspection no signs of respiratory distress, breathing rate appears normal, no obvious gross SOB, gasping or wheezing  CV: no obvious cyanosis  MS: moves all visible extremities without noticeable abnormality  PSYCH/NEURO: pleasant and cooperative, no obvious depression or anxiety, speech and thought processing grossly intact  ASSESSMENT AND PLAN:  Discussed the following assessment and plan:  Acute bilateral thoracic back pain - Plan: DG Thoracic Spine W/Swimmers  Compression fracture of T9 vertebra, initial encounter (HCC)  Compression fracture of T9 vertebra (HCC) S/p bone biopsy and vertebroplasty may 26.  He has had persistent pain  aggravated by sitting on soft seats.  Repeat plain films to ensure surrounding vertebrae have not collapsed.  Tylenol and tramadol prn    I discussed the assessment and treatment plan with the patient. The patient was provided an opportunity to ask questions and all were answered. The patient agreed with the plan and demonstrated an understanding of the instructions.   The patient was advised to call back or seek an in-person evaluation if the symptoms worsen or if the condition fails to improve as anticipated.   I spent 30 minutes dedicated to the care of this patient on the date of this encounter to include pre-visit review of his medical history,  Face-to-face time with the patient , and post visit ordering of testing and therapeutics.    Crecencio Mc, MD

## 2021-11-23 NOTE — Telephone Encounter (Signed)
Pt could not do this morning due to having a cardiology appt but he scheduled for this afternoon at 4:30.

## 2021-11-23 NOTE — Assessment & Plan Note (Addendum)
S/p bone biopsy and vertebroplasty may 26.  He has had persistent pain  aggravated by sitting on soft seats.  Repeat plain films to ensure surrounding vertebrae have not collapsed.  Tylenol and tramadol prn

## 2021-11-24 ENCOUNTER — Ambulatory Visit (INDEPENDENT_AMBULATORY_CARE_PROVIDER_SITE_OTHER): Payer: PPO

## 2021-11-24 ENCOUNTER — Other Ambulatory Visit: Payer: PPO

## 2021-11-24 DIAGNOSIS — M47814 Spondylosis without myelopathy or radiculopathy, thoracic region: Secondary | ICD-10-CM | POA: Diagnosis not present

## 2021-11-24 DIAGNOSIS — S22079A Unspecified fracture of T9-T10 vertebra, initial encounter for closed fracture: Secondary | ICD-10-CM | POA: Diagnosis not present

## 2021-11-24 DIAGNOSIS — M546 Pain in thoracic spine: Secondary | ICD-10-CM

## 2021-11-24 DIAGNOSIS — I700 Atherosclerosis of aorta: Secondary | ICD-10-CM | POA: Diagnosis not present

## 2021-11-29 DIAGNOSIS — M5459 Other low back pain: Secondary | ICD-10-CM | POA: Diagnosis not present

## 2021-11-29 DIAGNOSIS — S22070D Wedge compression fracture of T9-T10 vertebra, subsequent encounter for fracture with routine healing: Secondary | ICD-10-CM | POA: Diagnosis not present

## 2021-11-29 DIAGNOSIS — R2689 Other abnormalities of gait and mobility: Secondary | ICD-10-CM | POA: Diagnosis not present

## 2021-11-29 DIAGNOSIS — M6281 Muscle weakness (generalized): Secondary | ICD-10-CM | POA: Diagnosis not present

## 2021-12-01 ENCOUNTER — Inpatient Hospital Stay: Payer: PPO | Attending: Oncology

## 2021-12-01 ENCOUNTER — Ambulatory Visit
Admission: RE | Admit: 2021-12-01 | Discharge: 2021-12-01 | Disposition: A | Discharge status: Home / Self Care | Payer: PPO | Source: Ambulatory Visit | Attending: Oncology | Admitting: Oncology

## 2021-12-01 DIAGNOSIS — R918 Other nonspecific abnormal finding of lung field: Secondary | ICD-10-CM | POA: Insufficient documentation

## 2021-12-01 DIAGNOSIS — C251 Malignant neoplasm of body of pancreas: Secondary | ICD-10-CM

## 2021-12-01 DIAGNOSIS — I712 Thoracic aortic aneurysm, without rupture, unspecified: Secondary | ICD-10-CM | POA: Diagnosis not present

## 2021-12-01 DIAGNOSIS — I701 Atherosclerosis of renal artery: Secondary | ICD-10-CM | POA: Diagnosis not present

## 2021-12-01 DIAGNOSIS — I722 Aneurysm of renal artery: Secondary | ICD-10-CM | POA: Diagnosis not present

## 2021-12-01 DIAGNOSIS — Z08 Encounter for follow-up examination after completed treatment for malignant neoplasm: Secondary | ICD-10-CM | POA: Insufficient documentation

## 2021-12-01 DIAGNOSIS — I700 Atherosclerosis of aorta: Secondary | ICD-10-CM | POA: Diagnosis not present

## 2021-12-01 DIAGNOSIS — C259 Malignant neoplasm of pancreas, unspecified: Secondary | ICD-10-CM | POA: Diagnosis not present

## 2021-12-01 DIAGNOSIS — Z8507 Personal history of malignant neoplasm of pancreas: Secondary | ICD-10-CM | POA: Diagnosis not present

## 2021-12-01 LAB — CBC WITH DIFFERENTIAL/PLATELET
Abs Immature Granulocytes: 0.02 10*3/uL (ref 0.00–0.07)
Basophils Absolute: 0 10*3/uL (ref 0.0–0.1)
Basophils Relative: 1 %
Eosinophils Absolute: 0.1 10*3/uL (ref 0.0–0.5)
Eosinophils Relative: 1 %
HCT: 40.3 % (ref 39.0–52.0)
Hemoglobin: 13.9 g/dL (ref 13.0–17.0)
Immature Granulocytes: 0 %
Lymphocytes Relative: 44 %
Lymphs Abs: 2.8 10*3/uL (ref 0.7–4.0)
MCH: 30.7 pg (ref 26.0–34.0)
MCHC: 34.5 g/dL (ref 30.0–36.0)
MCV: 89 fL (ref 80.0–100.0)
Monocytes Absolute: 0.8 10*3/uL (ref 0.1–1.0)
Monocytes Relative: 13 %
Neutro Abs: 2.6 10*3/uL (ref 1.7–7.7)
Neutrophils Relative %: 41 %
Platelets: 280 10*3/uL (ref 150–400)
RBC: 4.53 MIL/uL (ref 4.22–5.81)
RDW: 14.5 % (ref 11.5–15.5)
WBC: 6.4 10*3/uL (ref 4.0–10.5)
nRBC: 0 % (ref 0.0–0.2)

## 2021-12-01 LAB — COMPREHENSIVE METABOLIC PANEL
ALT: 32 U/L (ref 0–44)
AST: 33 U/L (ref 15–41)
Albumin: 3.9 g/dL (ref 3.5–5.0)
Alkaline Phosphatase: 69 U/L (ref 38–126)
Anion gap: 9 (ref 5–15)
BUN: 27 mg/dL — ABNORMAL HIGH (ref 8–23)
CO2: 28 mmol/L (ref 22–32)
Calcium: 8.9 mg/dL (ref 8.9–10.3)
Chloride: 101 mmol/L (ref 98–111)
Creatinine, Ser: 0.88 mg/dL (ref 0.61–1.24)
GFR, Estimated: >60 mL/min (ref >60)
Glucose, Bld: 136 mg/dL — ABNORMAL HIGH (ref 70–99)
Potassium: 4.1 mmol/L (ref 3.5–5.1)
Sodium: 138 mmol/L (ref 135–145)
Total Bilirubin: 0.8 mg/dL (ref 0.3–1.2)
Total Protein: 7.1 g/dL (ref 6.5–8.1)

## 2021-12-01 MED ORDER — HEPARIN SOD (PORK) LOCK FLUSH 100 UNIT/ML IV SOLN
500.0000 [IU] | Freq: Once | INTRAVENOUS | Status: AC
  Administered 2021-12-01: 500 [IU] via INTRAVENOUS

## 2021-12-01 MED ORDER — IOHEXOL 300 MG/ML  SOLN
80.0000 mL | Freq: Once | INTRAMUSCULAR | Status: AC | PRN
  Administered 2021-12-01: 80 mL via INTRAVENOUS

## 2021-12-02 DIAGNOSIS — M5459 Other low back pain: Secondary | ICD-10-CM | POA: Diagnosis not present

## 2021-12-02 DIAGNOSIS — S22070D Wedge compression fracture of T9-T10 vertebra, subsequent encounter for fracture with routine healing: Secondary | ICD-10-CM | POA: Diagnosis not present

## 2021-12-02 DIAGNOSIS — M6281 Muscle weakness (generalized): Secondary | ICD-10-CM | POA: Diagnosis not present

## 2021-12-02 DIAGNOSIS — R2689 Other abnormalities of gait and mobility: Secondary | ICD-10-CM | POA: Diagnosis not present

## 2021-12-02 LAB — CANCER ANTIGEN 19-9: CA 19-9: 10 U/mL (ref 0–35)

## 2021-12-03 ENCOUNTER — Inpatient Hospital Stay (HOSPITAL_BASED_OUTPATIENT_CLINIC_OR_DEPARTMENT_OTHER): Payer: PPO | Admitting: Oncology

## 2021-12-03 DIAGNOSIS — C251 Malignant neoplasm of body of pancreas: Secondary | ICD-10-CM

## 2021-12-03 DIAGNOSIS — Z08 Encounter for follow-up examination after completed treatment for malignant neoplasm: Secondary | ICD-10-CM | POA: Diagnosis not present

## 2021-12-03 DIAGNOSIS — Z8507 Personal history of malignant neoplasm of pancreas: Secondary | ICD-10-CM

## 2021-12-03 DIAGNOSIS — R918 Other nonspecific abnormal finding of lung field: Secondary | ICD-10-CM | POA: Diagnosis not present

## 2021-12-04 ENCOUNTER — Encounter: Payer: Self-pay | Admitting: Oncology

## 2021-12-04 NOTE — Progress Notes (Signed)
Hematology/Oncology Consult note [facility]  Telephone:([phone] [phone]  Patient Care Team: Crecencio Mc, MD as PCP - General (Internal Medicine) Clent Jacks, RN as Oncology Nurse Navigator   Name of the patient: George Owens  [id number]  14-Oct-1931   Date of visit: 12/04/21  Diagnosis-H1B pancreatic cancer s/p resection and chemotherapy  Chief complaint/ Reason for visit-discuss CT scan results and further management  Heme/Onc history: patient is a 88 year old male with a past medical history significant for GERD, hyperlipidemia, cirrhosis.  He was having symptoms of Abdominal pain and reflux and therefore underwent a CT abdomen and pelvis with contrast on 09/11/2020 which showed a hypointense ill-defined mass in the pancreatic body up to 25 mm.  No extrapancreatic infiltrative density about the aorta or proximal [name].  No local regional adenopathy or distant metastatic disease.  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  This was followed by an EUS at [facility] which showed a hypoechoic mass measuring 1.8 x 1.6 cm in the pancreatic body.  Endosonographic borders poorly defined.  Upstream pancreatic duct dilatation.  No abnormal appearing lymph nodes.  Endosonographic imaging of the liver showed no lesion.  Biopsy showed adenocarcinoma.   PET scan showed 2.7 x 2 cm ovoid mass in the body of the pancreas with an SUV of 7.1.  18 mm hypermetabolic nodule in the isthmus of the thyroid gland.  No evidence of local regional adenopathy or distant metastatic disease.   Patient was evaluated by Dr. Hyman Hopes at [facility] for consideration of surgery and has been deemed to be upfront resectable surgical candidate.  However plan was to offer him neoadjuvant chemotherapy for 3 months to control micrometastatic disease before proceeding with definitive surgery.  If ultimately patient decides not to proceed with surgery then radiation at [facility] also remains an option.    Patient received 8 doses of gemcitabine and Abraxane chemotherapy 1 week on 1 week off due to neutropenia and thrombocytopenia. Patient underwent laparoscopy surgery by Dr. Hyman Hopes at [facility].  Final pathology showed 3 cm grade 3 poorly differentiated adenocarcinoma of the pancreas with negative margins.  15 lymph nodes negative for malignancy.  Absent chemotherapy response with extensive residual cancer and no evidence of tumor regression.  Perineural invasion identified lymphovascular invasion not identified.  Patient completed 4 months of adjuvant gemcitabine Abraxane chemotherapy in January 2023.   Interval history-patient reports occasional left chest wall pain when he twists.  Back pain is better after kyphoplasty Denies any shortness of complaints  ECOG PS- 1 Pain scale- 0   Review of systems- Review of Systems  Constitutional:  Negative for chills, fever, malaise/fatigue and weight loss.  HENT:  Negative for congestion, ear discharge and nosebleeds.   Eyes:  Negative for blurred vision.  Respiratory:  Negative for cough, hemoptysis, sputum production, shortness of breath and wheezing.        Left chest wall pain  Cardiovascular:  Negative for chest pain, palpitations, orthopnea and claudication.  Gastrointestinal:  Negative for abdominal pain, blood in stool, constipation, diarrhea, heartburn, melena, nausea and vomiting.  Genitourinary:  Negative for dysuria, flank pain, frequency, hematuria and urgency.  Musculoskeletal:  Negative for back pain, joint pain and myalgias.  Skin:  Negative for rash.  Neurological:  Negative for dizziness, tingling, focal weakness, seizures, weakness and headaches.  Endo/Heme/Allergies:  Does not bruise/bleed easily.  Psychiatric/Behavioral:  Negative for depression and suicidal ideas. The patient does not have insomnia.       Allergies  Allergen Reactions   Kenalog [Triamcinolone Acetonide]     Blindness X 3 Days injection     Past Medical  History:  Diagnosis Date   Allergy    Aneurysm of abdominal aorta (HCC)    Basal cell carcinoma    right temple Dr. 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     Past Surgical History:  Procedure Laterality Date   CATARACT EXTRACTION Right    CATARACT EXTRACTION W/PHACO Left 12/26/2017   Procedure: CATARACT EXTRACTION PHACO AND INTRAOCULAR LENS PLACEMENT ([place])  COMPLICATED LEFT;  Surgeon: Leandrew Koyanagi, MD;  Location: [place];  Service: Ophthalmology;  Laterality: Left;  [place]   colonoscopy     ESOPHAGOGASTRODUODENOSCOPY (EGD) WITH PROPOFOL     EYE MUSCLE SURGERY Left    IR KYPHO THORACIC WITH BONE BIOPSY  11/11/2021   PORTA CATH INSERTION N/A 10/19/2020   Procedure: PORTA CATH INSERTION;  Surgeon: Algernon Huxley, MD;  Location: [place] [facility];  Service: Cardiovascular;  Laterality: N/A;   TONSILLECTOMY AND ADENOIDECTOMY      Social History   Socioeconomic History   Marital status: Married    Spouse name: Eloise   Number of children: 4   Years of education: Not on file   Highest education level: Not on file  Occupational History   Occupation: retired  Tobacco Use   Smoking status: Never   Smokeless tobacco: Never  Vaping Use   Vaping Use: Never used  Substance and Sexual Activity   Alcohol use: No   Drug use: No   Sexual activity: Yes    Partners: Female  Other Topics Concern   Not on file  Social History Narrative   Lives with wife in a 5 story retirement home.  He lives on the first floor.  4 year college degree.  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     Social Determinants of Health   Financial Resource Strain: Low Risk   (04/13/2020)   Overall Financial Resource Strain (CARDIA)    Difficulty of Paying Living Expenses: Not hard at all  Food Insecurity: No Food Insecurity (04/10/2019)   Hunger Vital Sign    Worried About Running Out of Food in the Last Year: Never true    Ran Out of Food in the Last Year: Never true  Transportation Needs: No Transportation Needs (04/13/2020)   PRAPARE - Hydrologist (Medical): No    Lack of Transportation (Non-Medical): No  Physical Activity: Sufficiently Active (04/10/2019)   Exercise Vital Sign    Days of Exercise per Week: 5 days    Minutes of Exercise per Session: 30 min  Stress: No Stress Concern Present (04/13/2020)   [place]    Feeling of Stress : Not at all  Social Connections: Unknown (04/13/2020)   Social Connection and Isolation Panel [NHANES]    Frequency of Communication with Friends and Family: Not on file    Frequency of Social Gatherings with Friends and Family: Not on file    Attends Religious Services: Not on file  Active Member of Clubs or Organizations: Not on file    Attends Club or Organization Meetings: Not on file    Marital Status: Married  Intimate Partner Violence: Not At Risk (04/10/2019)   Humiliation, Afraid, Rape, and Kick questionnaire    Fear of Current or Ex-Partner: No    Emotionally Abused: No    Physically Abused: No    Sexually Abused: No    Family History  Problem Relation Age of Onset   Stroke Mother    Arthritis Father    Heart disease Father    Brain cancer Brother        1/2 brother (possibly related to mother NOT related to patient)   Colon cancer Neg Hx    Esophageal cancer Neg Hx    Pancreatic cancer Neg Hx    Stomach cancer Neg Hx    Liver disease Neg Hx      Current Outpatient Medications:    acetaminophen (TYLENOL) 500 MG tablet, Take 1,000 mg by mouth every 6 (six) hours as needed for moderate pain., Disp:  , Rfl:    Calcium Carb-Cholecalciferol (CALCIUM 600 + D PO), Take 1 tablet by mouth daily., Disp: , Rfl:    cholecalciferol (VITAMIN D3) 25 MCG (1000 UNIT) tablet, Take 1,000 Units by mouth daily., Disp: , Rfl:    Cranberry 500 MG CAPS, Take 500 mg by mouth daily., Disp: , Rfl:    CREON 36000-114000 units CPEP capsule, Take 36,000 Units by mouth See admin instructions. Take 36000 units once daily with the largest meal of the day, Disp: , Rfl:    Denosumab (PROLIA Timblin), Inject 1 Dose into the skin every 6 (six) months., Disp: , Rfl:    hydrocortisone (ANUSOL-HC) 2.5 % rectal cream, Place 1 application rectally 2 (two) times daily. (Patient taking differently: Place 1 application  rectally 2 (two) times daily as needed for hemorrhoids.), Disp: 30 g, Rfl: 0   lidocaine-prilocaine (EMLA) cream, Apply to affected area once (Patient taking differently: Apply 1 application  topically daily as needed (port access).), Disp: 30 g, Rfl: 3   loratadine (CLARITIN) 10 MG tablet, Take 10 mg by mouth daily as needed for allergies., Disp: , Rfl:    metoprolol succinate (TOPROL-XL) 50 MG 24 hr tablet, Take 1.5 tablets (75 mg total) by mouth daily. (Patient taking differently: Take 50 mg by mouth daily.), Disp: 30 tablet, Rfl: 1   Misc Natural Products (JOINT HEALTH) CAPS, Take 1 capsule by mouth daily., Disp: , Rfl:    omeprazole (PRILOSEC) 40 MG capsule, Take 40 mg by mouth daily., Disp: , Rfl:    Polyethyl Glycol-Propyl Glycol (SYSTANE OP), Place 1 drop into both eyes daily., Disp: , Rfl:    Ranibizumab (LUCENTIS IO), Place 1 Dose into the right eye See admin instructions. Every 14 weeks, Disp: , Rfl:    tamsulosin (FLOMAX) 0.4 MG CAPS capsule, TAKE 1 CAPSULE BY MOUTH EVERY DAY AFTER SUPPER, Disp: 90 capsule, Rfl: 1   temazepam (RESTORIL) 30 MG capsule, TAKE 1 CAPSULE BY MOUTH AT BEDTIME AS NEEDED FOR SLEEP. (Patient taking differently: Take 30 mg by mouth at bedtime.), Disp: 30 capsule, Rfl: 2   vitamin B-12  (CYANOCOBALAMIN) 1000 MCG tablet, Take 1,000 mcg by mouth daily., Disp: , Rfl:  No current facility-administered medications for this visit.  Facility-Administered Medications Ordered in Other Visits:    heparin lock flush 100 UNIT/ML injection, , , ,   Physical exam:  Physical Exam Constitutional:      General:  He is not in acute distress. Cardiovascular:     Rate and Rhythm: Normal rate and regular rhythm.     Heart sounds: Normal heart sounds.  Pulmonary:     Effort: Pulmonary effort is normal.     Breath sounds: Normal breath sounds.  Abdominal:     General: Bowel sounds are normal.     Palpations: Abdomen is soft.  Skin:    General: Skin is warm and dry.  Neurological:     Mental Status: He is alert and oriented to person, place, and time.         Latest Ref Rng & Units 12/01/2021    8:27 AM  CMP  Glucose 70 - 99 mg/dL 136   BUN 8 - 23 mg/dL 27   Creatinine 0.61 - 1.24 mg/dL 0.88   Sodium 135 - 145 mmol/L 138   Potassium 3.5 - 5.1 mmol/L 4.1   Chloride 98 - 111 mmol/L 101   CO2 22 - 32 mmol/L 28   Calcium 8.9 - 10.3 mg/dL 8.9   Total Protein 6.5 - 8.1 g/dL 7.1   Total Bilirubin 0.3 - 1.2 mg/dL 0.8   Alkaline Phos 38 - 126 U/L 69   AST 15 - 41 U/L 33   ALT 0 - 44 U/L 32       Latest Ref Rng & Units 12/01/2021    8:27 AM  CBC  WBC 4.0 - 10.5 K/uL 6.4   Hemoglobin 13.0 - 17.0 g/dL 13.9   Hematocrit 39.0 - 52.0 % 40.3   Platelets 150 - 400 K/uL 280     No images are attached to the encounter.  CT CHEST ABDOMEN PELVIS W CONTRAST  Result Date: 12/02/2021 CLINICAL DATA:  Pancreatic cancer restaging, status post distal pancreatectomy and splenectomy, chemotherapy complete * Tracking Code: BO * EXAM: CT CHEST, ABDOMEN, AND PELVIS WITH CONTRAST TECHNIQUE: Multidetector CT imaging of the chest, abdomen and pelvis was performed following the standard protocol during bolus administration of intravenous contrast. RADIATION DOSE REDUCTION: This exam was performed  according to the departmental dose-optimization program which includes automated exposure control, adjustment of the mA and/or kV according to patient size and/or use of iterative reconstruction technique. CONTRAST:  34m OMNIPAQUE IOHEXOL 300 MG/ML SOLN, additional oral enteric contrast COMPARISON:  CT chest, 10/08/2021, CT abdomen pelvis, 09/28/2021, CT chest abdomen pelvis, 08/04/2021, CT chest, 02/11/2021 FINDINGS: CT CHEST FINDINGS Cardiovascular: Right chest port catheter. Aortic atherosclerosis. Unchanged enlargement of the tubular ascending thoracic aorta, measuring up to 4.5 x 4.5 cm (series 2, image 32). Normal heart size. Scattered left and right coronary artery calcifications. No pericardial effusion. Mediastinum/Nodes: No enlarged mediastinal, hilar, or axillary lymph nodes. Thyroid gland, trachea, and esophagus demonstrate no significant findings. Lungs/Pleura: Multiple small bilateral pulmonary nodules are again seen, which appear to have slightly enlarged compared to prior examination for example a 0.7 cm nodule of the anterior left apex, previously 0.4 cm (series 3, image 30) and a 0.7 cm nodule in the dependent right lower lobe, previously 0.6 cm (series 3, image 140). These are clearly new and enlarged over a longer period of time in comparison to several prior examinations dated 08/04/2021 and 02/11/2021. No pleural effusion or pneumothorax. Musculoskeletal: No chest wall mass or suspicious osseous lesions identified. CT ABDOMEN PELVIS FINDINGS Hepatobiliary: No solid liver abnormality is seen. No gallstones, gallbladder wall thickening, or biliary dilatation. Pancreas: Status post distal pancreatectomy. No evidence of recurrent soft tissue at the resection margin. No pancreatic ductal dilatation or  surrounding inflammatory changes. Spleen: Status post splenectomy. Adrenals/Urinary Tract: Adrenal glands are unremarkable. Kidneys are normal, without renal calculi, solid lesion, or hydronephrosis.  Bladder is unremarkable. Stomach/Bowel: Stomach is within normal limits. Appendix appears normal. No evidence of bowel wall thickening, distention, or inflammatory changes. Vascular/Lymphatic: Aortic atherosclerosis. 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 No enlarged abdominal or pelvic lymph nodes. Reproductive: Prostatomegaly.  Biopsy marking clips in the prostate. Other: No abdominal wall hernia or abnormality. No ascites. Musculoskeletal: No acute osseous findings. Status post vertebral cement. Unchanged superior endplate deformities of the lumbar spine most notably L2. IMPRESSION: 1. Status post distal pancreatectomy and splenectomy. No evidence of local recurrence. 2. Multiple small bilateral pulmonary nodules are again seen, which have continued to enlarge and are clearly new and enlarged in comparison to prior examinations dating back to 02/11/2021. Findings are highly concerning for small pulmonary metastases. 3. Unchanged enlargement of the tubular ascending thoracic aorta, measuring up to 4.5 x 4.5 cm. Ascending thoracic aortic aneurysm. Recommend semi-annual imaging followup by CTA or MRA and referral to cardiothoracic surgery if not already obtained and clinically appropriate. This recommendation follows 2010 ACCF/AHA/AATS/ACR/ASA/SCA/SCAI/SIR/STS/SVM Guidelines for the Diagnosis and Management of Patients With Thoracic Aortic Disease. Circulation. 2010; 121: E751-Z001. Aortic aneurysm NOS (ICD10-I71.9) 4. 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 5. Coronary artery disease. Aortic Atherosclerosis (ICD10-I70.0). Electronically Signed   By: Delanna Ahmadi M.D.   On: 12/02/2021 14:28   DG Thoracic Spine W/Swimmers  Result Date: 11/25/2021 CLINICAL DATA:  Recent T9 fracture with kyphoplasty, persistent pain. EXAM: THORACIC SPINE - 3 VIEWS COMPARISON:  10/12/2021. FINDINGS: There is redemonstration of a fracture at T9  with kyphoplasty changes, not significantly changed from the prior exam. Alignment is normal. Minimal degenerative endplate changes are noted in the upper thoracic spine. There is atherosclerotic calcification of the aorta. A chest port is present over the chest on the right. IMPRESSION: Stable fracture at T9 with kyphoplasty changes. No new fracture is seen. Electronically Signed   By: Brett Fairy M.D.   On: 11/25/2021 21:17   IR KYPHO THORACIC WITH BONE BIOPSY  Result Date: 11/12/2021 INDICATION: Severe midthoracic pain due to compression fracture at T9. EXAM: BALLOON KYPHOPLASTY AT T9 COMPARISON:  Recent MRI of the thoracic spine. MEDICATIONS: As antibiotic prophylaxis, Ancef 2 g IV was ordered pre-procedure and administered intravenously within 1 hour of incision. All current medications are in the EMR and have been reviewed as part of this encounter. ANESTHESIA/SEDATION: Moderate (conscious) sedation was employed during this procedure. A total of Versed 2 mg and Fentanyl 50 mcg was administered intravenously by the radiology nurse. Total intra-service moderate Sedation Time: 31 minutes. The patient's level of consciousness and vital signs were monitored continuously by radiology nursing throughout the procedure under my direct supervision. FLUOROSCOPY: Radiation Exposure Index (as provided by the fluoroscopic device): 749.4 mGy Kerma COMPLICATIONS: None immediate. PROCEDURE: Following a full explanation of the procedure along with the potential associated complications, an informed witnessed consent was obtained. The patient was placed prone on the fluoroscopic table. The skin overlying the thoracic region was then prepped and draped in the usual sterile fashion. The right pedicle at T9 was then infiltrated with 0.25% bupivacaine followed by the advancement of an 11-gauge Jamshidi needle through the right pedicle into the posterior one-third at T9. This was then exchanged for a Kyphon advanced osteo  introducer system comprised of a working cannula and  a Kyphon osteo drill. This combination was then advanced over a Kyphon osteo bone pin until the tip of the Kyphon osteo drill was in the posterior third at T9. At this time, the bone pin was removed. In a medial trajectory, the combination was advanced until the tip of the working cannula was inside the posterior one-third at T9. The osteo drill was removed and a core sample sent for pathologic analysis. Through the working cannula, a Kyphon bone biopsy device was advanced to within 5 mm of the anterior aspect of T9. A core sample from this was also sent for pathologic analysis. Through the working cannula, a Kyphon inflatable bone tamp 20 x 3 was advanced and positioned with the distal marker 5 mm from the anterior aspect of T9. Crossing of the midline was seen on the AP projection. At this time, the balloon was expanded using contrast via a Kyphon inflation syringe device via microtubing. Inflations were continued until there was apposition with the superior and the inferior endplates. At this time, methylmethacrylate mixture was reconstituted with Tobramycin in the Kyphon bone mixing device system. This was then loaded onto the Kyphon bone fillers. The balloon was deflated and removed followed by the instillation of 2 bone filler equivalents of methylmethacrylate mixture at T9 with excellent filling in the AP and lateral projections. Minimal extravasation was noted in the disk at the superior endplate. No epidural venous contamination was seen. The working cannula and the bone filler were then retrieved and removed. Hemostasis was achieved and the skin entry site. IMPRESSION: 1. Status post fluoroscopic-guided needle placement for deep core bone biopsy at T9. 2. Status post vertebral body augmentation using balloon kyphoplasty at T9 as described without event. If the patient has known osteoporosis, recommend treatment as clinically indicated. If the patient's  bone density status is unknown, DEXA scan is recommended. Electronically Signed   By: Luanne Bras M.D.   On: 11/12/2021 07:53     Assessment and plan- Patient is a 88 y.o. male with history of stage Ib pancreatic cancer s/p surgery and neoadjuvant as well as adjuvant chemotherapy here to discuss CT scan results and further management  CA 19-9 continues to be normal.  In April 2023 patient was having progressive back pain and MRI as well as CT chest showed T9 compression fracture s/p kyphoplasty which did not show any evidence of malignancy.  At that time he was noted to have 4 mm lung nodules.  Repeat CT chest abdomen and pelvis with contrast at this time shows again multiple small pulmonary nodules which have grown as compared to prior CT from 4 mm to 7 mm.  Stable aneurysm involving the right renal artery as well as ascending thoracic aorta.  Given that these lung nodules are still small I do not think a PET CT scan would show any hypermetabolism in these nodules.  I will reach out to radiology next week to see if these lung nodules can be biopsied.  After these lung nodules have been present and growing over the last 2 months raises the concern for metastatic disease.  If these nodules are not biopsy well at this time we will consider repeat CT in 2 months time.  For now I will see him back in 2 months with scans prior but I will reach out with the patient after I speak to radiology  Suspect left chest wall pain secondary to musculoskeletal etiology.  CT did not show any acute findings   Visit Diagnosis 1.  Encounter for follow-up surveillance of pancreatic cancer   2. Lung nodules      Dr. Randa Evens, MD, MPH [facility] [phone] 12/04/2021 2:54 PM

## 2021-12-06 ENCOUNTER — Telehealth: Payer: Self-pay

## 2021-12-06 DIAGNOSIS — C251 Malignant neoplasm of body of pancreas: Secondary | ICD-10-CM

## 2021-12-06 DIAGNOSIS — R918 Other nonspecific abnormal finding of lung field: Secondary | ICD-10-CM

## 2021-12-06 NOTE — Telephone Encounter (Signed)
Spoke with George Owens regarding biopsy of lung nodule. We have spoken to IR and they will arrange biopsy of RLL lung nodule. Once scheduled he will be contacted with the date/instructions.

## 2021-12-07 ENCOUNTER — Telehealth: Payer: Self-pay

## 2021-12-07 NOTE — Telephone Encounter (Signed)
Called and reviewed appointment details for 6/23 lung biopsy and follow up with Dr. Janese Banks for results. He can expect a call from special procedures 24-48 hours prior to 6/23 with further biopsy instructions.

## 2021-12-08 ENCOUNTER — Ambulatory Visit: Payer: PPO

## 2021-12-08 NOTE — Progress Notes (Signed)
Patient on schedule for Lung biopsy 6/23, spoke with patient on phone with pre procedure instructions given. Made aware to be here @ 0900 , NPO after MN prior to procedure and driver post procedure/recovery/discharge. Stated understanding.

## 2021-12-09 ENCOUNTER — Other Ambulatory Visit: Payer: Self-pay | Admitting: Physician Assistant

## 2021-12-09 DIAGNOSIS — C251 Malignant neoplasm of body of pancreas: Secondary | ICD-10-CM

## 2021-12-10 ENCOUNTER — Ambulatory Visit
Admission: RE | Admit: 2021-12-10 | Discharge: 2021-12-10 | Disposition: A | Discharge status: Home / Self Care | Payer: PPO | Source: Ambulatory Visit | Attending: Oncology | Admitting: Oncology

## 2021-12-10 ENCOUNTER — Telehealth: Payer: Self-pay | Admitting: *Deleted

## 2021-12-10 ENCOUNTER — Ambulatory Visit: Payer: PPO | Admitting: Oncology

## 2021-12-10 DIAGNOSIS — R918 Other nonspecific abnormal finding of lung field: Secondary | ICD-10-CM

## 2021-12-10 DIAGNOSIS — C251 Malignant neoplasm of body of pancreas: Secondary | ICD-10-CM

## 2021-12-15 DIAGNOSIS — H353223 Exudative age-related macular degeneration, left eye, with inactive scar: Secondary | ICD-10-CM | POA: Diagnosis not present

## 2021-12-15 DIAGNOSIS — H35433 Paving stone degeneration of retina, bilateral: Secondary | ICD-10-CM | POA: Diagnosis not present

## 2021-12-15 DIAGNOSIS — H31093 Other chorioretinal scars, bilateral: Secondary | ICD-10-CM | POA: Diagnosis not present

## 2021-12-15 DIAGNOSIS — H353211 Exudative age-related macular degeneration, right eye, with active choroidal neovascularization: Secondary | ICD-10-CM | POA: Diagnosis not present

## 2021-12-17 ENCOUNTER — Inpatient Hospital Stay (HOSPITAL_BASED_OUTPATIENT_CLINIC_OR_DEPARTMENT_OTHER): Payer: PPO | Admitting: Oncology

## 2021-12-17 ENCOUNTER — Encounter: Payer: Self-pay | Admitting: Oncology

## 2021-12-17 VITALS — BP 108/63 | HR 73 | Temp 97.7°F | Resp 18 | Wt 183.2 lb

## 2021-12-17 DIAGNOSIS — C251 Malignant neoplasm of body of pancreas: Secondary | ICD-10-CM | POA: Diagnosis not present

## 2021-12-17 DIAGNOSIS — Z08 Encounter for follow-up examination after completed treatment for malignant neoplasm: Secondary | ICD-10-CM | POA: Diagnosis not present

## 2021-12-17 DIAGNOSIS — R918 Other nonspecific abnormal finding of lung field: Secondary | ICD-10-CM | POA: Diagnosis not present

## 2021-12-17 NOTE — Progress Notes (Signed)
Hematology/Oncology Consult note [facility]  [phone] [phone]  Patient Care Team: Crecencio Mc, MD as PCP - General (Internal Medicine) Clent Jacks, RN as Oncology Nurse Navigator   Name of the patient: George Owens  [id number]  [birth year]   Date of visit: 12/17/21  Diagnosis-stage Ib pancreatic cancer s/p resection and chemotherapy  Chief complaint/ Reason for visit-discuss further management of lung nodules  Heme/Onc history: patient is a 88 year old male with a past medical history significant for GERD, hyperlipidemia, cirrhosis.  He was having symptoms of Abdominal pain and reflux and therefore underwent a CT abdomen and pelvis with contrast on 09/11/2020 which showed a hypointense ill-defined mass in the pancreatic body up to 25 mm.  No extrapancreatic infiltrative density about the aorta or proximal [name].  No local regional adenopathy or distant metastatic disease.  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  This was followed by an EUS at [facility] which showed a hypoechoic mass measuring 1.8 x 1.6 cm in the pancreatic body.  Endosonographic borders poorly defined.  Upstream pancreatic duct dilatation.  No abnormal appearing lymph nodes.  Endosonographic imaging of the liver showed no lesion.  Biopsy showed adenocarcinoma.   PET scan showed 2.7 x 2 cm ovoid mass in the body of the pancreas with an SUV of 7.1.  18 mm hypermetabolic nodule in the isthmus of the thyroid gland.  No evidence of local regional adenopathy or distant metastatic disease.   Patient was evaluated by Dr. Hyman Hopes at [facility] for consideration of surgery and has been deemed to be upfront resectable surgical candidate.  However plan was to offer him neoadjuvant chemotherapy for 3 months to control micrometastatic disease before proceeding with definitive surgery.  If ultimately patient decides not to proceed with surgery then radiation at [facility] also remains an option.    Patient received 8 doses of gemcitabine and Abraxane chemotherapy 1 week on 1 week off due to neutropenia and thrombocytopenia. Patient underwent laparoscopy surgery by Dr. Hyman Hopes at [facility].  Final pathology showed 3 cm grade 3 poorly differentiated adenocarcinoma of the pancreas with negative margins.  15 lymph nodes negative for malignancy.  Absent chemotherapy response with extensive residual cancer and no evidence of tumor regression.  Perineural invasion identified lymphovascular invasion not identified.  Patient completed 4 months of adjuvant gemcitabine Abraxane chemotherapy in January 2023.   Interval history-he is doing well overall.  Working with physical therapy and in his balance.  Denies any significant pain.  He takes Creon once a day.  ECOG PS- 1 Pain scale- 0 Opioid associated constipation- no  Review of systems- Review of Systems  Constitutional:  Positive for malaise/fatigue. Negative for chills, fever and weight loss.  HENT:  Negative for congestion, ear discharge and nosebleeds.   Eyes:  Negative for blurred vision.  Respiratory:  Negative for cough, hemoptysis, sputum production, shortness of breath and wheezing.   Cardiovascular:  Negative for chest pain, palpitations, orthopnea and claudication.  Gastrointestinal:  Negative for abdominal pain, blood in stool, constipation, diarrhea, heartburn, melena, nausea and vomiting.  Genitourinary:  Negative for dysuria, flank pain, frequency, hematuria and urgency.  Musculoskeletal:  Negative for back pain, joint pain and myalgias.  Skin:  Negative for rash.  Neurological:  Negative for dizziness, tingling, focal weakness, seizures, weakness and headaches.  Endo/Heme/Allergies:  Does not bruise/bleed easily.  Psychiatric/Behavioral:  Negative for depression and suicidal ideas. The patient does not have insomnia.       Allergies  Allergen Reactions   Kenalog [Triamcinolone Acetonide]     Blindness X 3 Days injection      Past Medical History:  Diagnosis Date   Allergy    Aneurysm of abdominal aorta (HCC)    Basal cell carcinoma    right temple Dr. 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     Past Surgical History:  Procedure Laterality Date   CATARACT EXTRACTION Right    CATARACT EXTRACTION W/PHACO Left 12/26/2017   Procedure: CATARACT EXTRACTION PHACO AND INTRAOCULAR LENS PLACEMENT ([place])  COMPLICATED LEFT;  Surgeon: Leandrew Koyanagi, MD;  Location: [place];  Service: Ophthalmology;  Laterality: Left;  [place]   colonoscopy     ESOPHAGOGASTRODUODENOSCOPY (EGD) WITH PROPOFOL     EYE MUSCLE SURGERY Left    IR KYPHO THORACIC WITH BONE BIOPSY  11/11/2021   PORTA CATH INSERTION N/A 10/19/2020   Procedure: PORTA CATH INSERTION;  Surgeon: Algernon Huxley, MD;  Location: [place] [facility];  Service: Cardiovascular;  Laterality: N/A;   TONSILLECTOMY AND ADENOIDECTOMY      Social History   Socioeconomic History   Marital status: Married    Spouse name: Eloise   Number of children: 4   Years of education: Not on file   Highest education level: Not on file  Occupational History   Occupation: retired  Tobacco Use   Smoking status: Never   Smokeless tobacco: Never  Vaping Use   Vaping Use: Never used  Substance and Sexual Activity   Alcohol use: No   Drug use: No   Sexual activity: Yes    Partners: Female  Other Topics Concern   Not on file  Social History Narrative   Lives with wife in a 5 story retirement home.  He lives on the first floor.  4 year college degree.  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     Social Determinants of Health   Financial Resource  Strain: Low Risk  (04/13/2020)   Overall Financial Resource Strain (CARDIA)    Difficulty of Paying Living Expenses: Not hard at all  Food Insecurity: No Food Insecurity (04/10/2019)   Hunger Vital Sign    Worried About Running Out of Food in the Last Year: Never true    Ran Out of Food in the Last Year: Never true  Transportation Needs: No Transportation Needs (04/13/2020)   PRAPARE - Hydrologist (Medical): No    Lack of Transportation (Non-Medical): No  Physical Activity: Sufficiently Active (04/10/2019)   Exercise Vital Sign    Days of Exercise per Week: 5 days    Minutes of Exercise per Session: 30 min  Stress: No Stress Concern Present (04/13/2020)   [place]    Feeling of Stress : Not at all  Social Connections: Unknown (04/13/2020)   Social Connection and Isolation Panel [NHANES]    Frequency of Communication with Friends and Family: Not on file    Frequency of Social Gatherings with Friends and Family: Not on file    Attends Religious Services: Not on file  Active Member of Clubs or Organizations: Not on file    Attends Club or Organization Meetings: Not on file    Marital Status: Married  Intimate Partner Violence: Not At Risk (04/10/2019)   Humiliation, Afraid, Rape, and Kick questionnaire    Fear of Current or Ex-Partner: No    Emotionally Abused: No    Physically Abused: No    Sexually Abused: No    Family History  Problem Relation Age of Onset   Stroke Mother    Arthritis Father    Heart disease Father    Brain cancer Brother        1/2 brother (possibly related to mother NOT related to patient)   Colon cancer Neg Hx    Esophageal cancer Neg Hx    Pancreatic cancer Neg Hx    Stomach cancer Neg Hx    Liver disease Neg Hx      Current Outpatient Medications:    acetaminophen (TYLENOL) 500 MG tablet, Take 1,000 mg by mouth every 6 (six) hours as needed for  moderate pain., Disp: , Rfl:    Calcium Carb-Cholecalciferol (CALCIUM 600 + D PO), Take 1 tablet by mouth daily., Disp: , Rfl:    cholecalciferol (VITAMIN D3) 25 MCG (1000 UNIT) tablet, Take 1,000 Units by mouth daily., Disp: , Rfl:    Cranberry 500 MG CAPS, Take 500 mg by mouth daily., Disp: , Rfl:    CREON 36000-114000 units CPEP capsule, Take 36,000 Units by mouth See admin instructions. Take 36000 units once daily with the largest meal of the day, Disp: , Rfl:    Denosumab (PROLIA  [place]), Inject 1 Dose into the skin every 6 (six) months., Disp: , Rfl:    hydrocortisone (ANUSOL-HC) 2.5 % rectal cream, Place 1 application rectally 2 (two) times daily., Disp: 30 g, Rfl: 0   loratadine (CLARITIN) 10 MG tablet, Take 10 mg by mouth daily as needed for allergies., Disp: , Rfl:    metoprolol succinate (TOPROL-XL) 50 MG 24 hr tablet, Take 1.5 tablets (75 mg total) by mouth daily. (Patient taking differently: Take 50 mg by mouth daily.), Disp: 30 tablet, Rfl: 1   Misc Natural Products (JOINT HEALTH) CAPS, Take 1 capsule by mouth daily., Disp: , Rfl:    omeprazole (PRILOSEC) 40 MG capsule, Take 40 mg by mouth daily., Disp: , Rfl:    Polyethyl Glycol-Propyl Glycol (SYSTANE OP), Place 1 drop into both eyes daily., Disp: , Rfl:    Ranibizumab (LUCENTIS IO), Place 1 Dose into the right eye See admin instructions. Every 14 weeks, Disp: , Rfl:    tamsulosin (FLOMAX) 0.4 MG CAPS capsule, TAKE 1 CAPSULE BY MOUTH EVERY DAY AFTER SUPPER, Disp: 90 capsule, Rfl: 1   temazepam (RESTORIL) 30 MG capsule, TAKE 1 CAPSULE BY MOUTH AT BEDTIME AS NEEDED FOR SLEEP. (Patient taking differently: Take 30 mg by mouth at bedtime.), Disp: 30 capsule, Rfl: 2   vitamin B-12 (CYANOCOBALAMIN) 1000 MCG tablet, Take 1,000 mcg by mouth daily., Disp: , Rfl:    lidocaine-prilocaine (EMLA) cream, Apply to affected area once (Patient not taking: Reported on 12/17/2021), Disp: 30 g, Rfl: 3 No current facility-administered medications for this  visit.  Facility-Administered Medications Ordered in Other Visits:    heparin lock flush 100 UNIT/ML injection, , , ,   Physical exam:  Vitals:   12/17/21 0940  BP: 108/63  Pulse: 73  Resp: 18  Temp: 97.7 F (36.5 C)  SpO2: 97%  Weight: 183 lb 3.2 oz (83.1 kg)  Physical Exam Constitutional:      General: He is not in acute distress. Cardiovascular:     Rate and Rhythm: Normal rate and regular rhythm.     Heart sounds: Normal heart sounds.  Pulmonary:     Effort: Pulmonary effort is normal.     Breath sounds: Normal breath sounds.  Abdominal:     General: Bowel sounds are normal.     Palpations: Abdomen is soft.  Skin:    General: Skin is warm and dry.  Neurological:     Mental Status: He is alert and oriented to person, place, and time.         Latest Ref Rng & Units 12/01/2021    8:27 AM  CMP  Glucose 70 - 99 mg/dL 136   BUN 8 - 23 mg/dL 27   Creatinine 0.61 - 1.24 mg/dL 0.88   Sodium 135 - 145 mmol/L 138   Potassium 3.5 - 5.1 mmol/L 4.1   Chloride 98 - 111 mmol/L 101   CO2 22 - 32 mmol/L 28   Calcium 8.9 - 10.3 mg/dL 8.9   Total Protein 6.5 - 8.1 g/dL 7.1   Total Bilirubin 0.3 - 1.2 mg/dL 0.8   Alkaline Phos 38 - 126 U/L 69   AST 15 - 41 U/L 33   ALT 0 - 44 U/L 32       Latest Ref Rng & Units 12/01/2021    8:27 AM  CBC  WBC 4.0 - 10.5 K/uL 6.4   Hemoglobin 13.0 - 17.0 g/dL 13.9   Hematocrit 39.0 - 52.0 % 40.3   Platelets 150 - 400 K/uL 280     No images are attached to the encounter.  CT CHEST ABDOMEN PELVIS W CONTRAST  Result Date: 12/02/2021 CLINICAL DATA:  Pancreatic cancer restaging, status post distal pancreatectomy and splenectomy, chemotherapy complete * Tracking Code: BO * EXAM: CT CHEST, ABDOMEN, AND PELVIS WITH CONTRAST TECHNIQUE: Multidetector CT imaging of the chest, abdomen and pelvis was performed following the standard protocol during bolus administration of intravenous contrast. RADIATION DOSE REDUCTION: This exam was performed  according to the departmental dose-optimization program which includes automated exposure control, adjustment of the mA and/or kV according to patient size and/or use of iterative reconstruction technique. CONTRAST:  62m OMNIPAQUE IOHEXOL 300 MG/ML SOLN, additional oral enteric contrast COMPARISON:  CT chest, 10/08/2021, CT abdomen pelvis, 09/28/2021, CT chest abdomen pelvis, 08/04/2021, CT chest, 02/11/2021 FINDINGS: CT CHEST FINDINGS Cardiovascular: Right chest port catheter. Aortic atherosclerosis. Unchanged enlargement of the tubular ascending thoracic aorta, measuring up to 4.5 x 4.5 cm (series 2, image 32). Normal heart size. Scattered left and right coronary artery calcifications. No pericardial effusion. Mediastinum/Nodes: No enlarged mediastinal, hilar, or axillary lymph nodes. Thyroid gland, trachea, and esophagus demonstrate no significant findings. Lungs/Pleura: Multiple small bilateral pulmonary nodules are again seen, which appear to have slightly enlarged compared to prior examination for example a 0.7 cm nodule of the anterior left apex, previously 0.4 cm (series 3, image 30) and a 0.7 cm nodule in the dependent right lower lobe, previously 0.6 cm (series 3, image 140). These are clearly new and enlarged over a longer period of time in comparison to several prior examinations dated 08/04/2021 and 02/11/2021. No pleural effusion or pneumothorax. Musculoskeletal: No chest wall mass or suspicious osseous lesions identified. CT ABDOMEN PELVIS FINDINGS Hepatobiliary: No solid liver abnormality is seen. No gallstones, gallbladder wall thickening, or biliary dilatation. Pancreas: Status post distal pancreatectomy. No evidence of recurrent soft tissue  at the resection margin. No pancreatic ductal dilatation or surrounding inflammatory changes. Spleen: Status post splenectomy. Adrenals/Urinary Tract: Adrenal glands are unremarkable. Kidneys are normal, without renal calculi, solid lesion, or hydronephrosis.  Bladder is unremarkable. Stomach/Bowel: Stomach is within normal limits. Appendix appears normal. No evidence of bowel wall thickening, distention, or inflammatory changes. Vascular/Lymphatic: Aortic atherosclerosis. 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 No enlarged abdominal or pelvic lymph nodes. Reproductive: Prostatomegaly.  Biopsy marking clips in the prostate. Other: No abdominal wall hernia or abnormality. No ascites. Musculoskeletal: No acute osseous findings. Status post vertebral cement. Unchanged superior endplate deformities of the lumbar spine most notably L2. IMPRESSION: 1. Status post distal pancreatectomy and splenectomy. No evidence of local recurrence. 2. Multiple small bilateral pulmonary nodules are again seen, which have continued to enlarge and are clearly new and enlarged in comparison to prior examinations dating back to 02/11/2021. Findings are highly concerning for small pulmonary metastases. 3. Unchanged enlargement of the tubular ascending thoracic aorta, measuring up to 4.5 x 4.5 cm. Ascending thoracic aortic aneurysm. Recommend semi-annual imaging followup by CTA or MRA and referral to cardiothoracic surgery if not already obtained and clinically appropriate. This recommendation follows 2010 ACCF/AHA/AATS/ACR/ASA/SCA/SCAI/SIR/STS/SVM Guidelines for the Diagnosis and Management of Patients With Thoracic Aortic Disease. Circulation. 2010; 121: K938-H829. Aortic aneurysm NOS (ICD10-I71.9) 4. 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 5. Coronary artery disease. Aortic Atherosclerosis (ICD10-I70.0). Electronically Signed   By: Delanna Ahmadi M.D.   On: 12/02/2021 14:28   DG Thoracic Spine W/Swimmers  Result Date: 11/25/2021 CLINICAL DATA:  Recent T9 fracture with kyphoplasty, persistent pain. EXAM: THORACIC SPINE - 3 VIEWS COMPARISON:  10/12/2021. FINDINGS: There is redemonstration of a fracture at T9  with kyphoplasty changes, not significantly changed from the prior exam. Alignment is normal. Minimal degenerative endplate changes are noted in the upper thoracic spine. There is atherosclerotic calcification of the aorta. A chest port is present over the chest on the right. IMPRESSION: Stable fracture at T9 with kyphoplasty changes. No new fracture is seen. Electronically Signed   By: Brett Fairy M.D.   On: 11/25/2021 21:17     Assessment and plan- Patient is a 88 y.o. male with history of stage Ib pancreatic cancer s/p surgery and chemotherapy here to discuss Further management  This was actually a visit to see me after lung biopsy.  However when patient went for the lung biopsy it was deemed that the lung spot that was close to the bronchial artery was at high risk for resection and therefore biopsy was aborted.  Plan was to repeat her CT scan in 3 months.  I explained to the patient that at this point it is uncertain as to what these lung nodules are since they are still small and subcentimeters.  PET CT scan would not help at this time.  CA 19-9 is normal.  I will plan to get a repeat CT chest abdomen and pelvis with contrast in 3 months and see him thereafter.  If there is a persistent growth in the size of lung nodules I will consider getting a PET scan followed by consideration for biopsy at that time   Visit Diagnosis 1. Lung nodules      Dr. Randa Evens, MD, MPH [facility] [phone] 12/17/2021 11:21 AM

## 2021-12-18 ENCOUNTER — Other Ambulatory Visit: Payer: Self-pay | Admitting: Internal Medicine

## 2021-12-20 NOTE — Telephone Encounter (Signed)
Refilled: 09/21/2021 Last OV: 09/15/2021 Next OV: not scheduled.

## 2021-12-23 ENCOUNTER — Other Ambulatory Visit: Payer: Self-pay | Admitting: Surgery

## 2021-12-23 DIAGNOSIS — I7121 Aneurysm of the ascending aorta, without rupture: Secondary | ICD-10-CM

## 2021-12-27 ENCOUNTER — Encounter: Payer: Self-pay | Admitting: Internal Medicine

## 2022-01-03 NOTE — Telephone Encounter (Signed)
Pt called this morning to say that he had not heard if anything about whether you were able to speak with Dr. Janese Banks and Dr. Cyndia Bent about just doing one scan.    Pt will not be available for scan the week of Sept. 11th.

## 2022-01-04 ENCOUNTER — Other Ambulatory Visit: Payer: Self-pay

## 2022-01-04 DIAGNOSIS — C251 Malignant neoplasm of body of pancreas: Secondary | ICD-10-CM

## 2022-01-07 ENCOUNTER — Other Ambulatory Visit: Payer: Self-pay

## 2022-01-07 ENCOUNTER — Other Ambulatory Visit: Payer: PPO

## 2022-01-07 ENCOUNTER — Telehealth: Payer: Self-pay | Admitting: *Deleted

## 2022-01-07 MED ORDER — CIPROFLOXACIN HCL 250 MG PO TABS: 250.0000 mg | ORAL_TABLET | Freq: Two times a day (BID) | ORAL | 0 refills | Status: AC

## 2022-01-07 NOTE — Telephone Encounter (Signed)
Received a message from answering service that patient called them and said he is having mild UTI symptoms and is having trouble controlling his bladder and that his temp is 100, He is out of town so he cannot come into office. Please advise

## 2022-01-07 NOTE — Telephone Encounter (Signed)
Patient informed of prescription sent and he said that [facility] has already contacted him that prescription is ready

## 2022-01-07 NOTE — Telephone Encounter (Signed)
Cipro has been sent

## 2022-01-10 ENCOUNTER — Other Ambulatory Visit: Payer: Self-pay

## 2022-01-10 ENCOUNTER — Ambulatory Visit: Payer: PPO | Admitting: Oncology

## 2022-01-27 ENCOUNTER — Other Ambulatory Visit: Payer: Self-pay | Admitting: *Deleted

## 2022-01-27 DIAGNOSIS — C251 Malignant neoplasm of body of pancreas: Secondary | ICD-10-CM

## 2022-02-02 ENCOUNTER — Other Ambulatory Visit: Payer: PPO

## 2022-02-02 ENCOUNTER — Ambulatory Visit: Payer: PPO

## 2022-02-04 ENCOUNTER — Other Ambulatory Visit: Payer: PPO

## 2022-02-07 ENCOUNTER — Ambulatory Visit: Payer: PPO | Admitting: Oncology

## 2022-02-08 ENCOUNTER — Ambulatory Visit
Admission: RE | Admit: 2022-02-08 | Discharge: 2022-02-08 | Disposition: A | Discharge status: Home / Self Care | Payer: PPO | Source: Ambulatory Visit | Attending: Surgery | Admitting: Surgery

## 2022-02-08 ENCOUNTER — Ambulatory Visit (INDEPENDENT_AMBULATORY_CARE_PROVIDER_SITE_OTHER): Payer: PPO | Admitting: Surgical

## 2022-02-08 VITALS — BP 127/70 | HR 65 | Resp 20 | Ht 75.0 in | Wt 181.0 lb

## 2022-02-08 DIAGNOSIS — I7121 Aneurysm of the ascending aorta, without rupture: Secondary | ICD-10-CM | POA: Diagnosis not present

## 2022-02-08 NOTE — Progress Notes (Signed)
Subjective:     Patient ID: George Owens, male    DOB: Nov 21, 1931, 88 y.o.   MRN: [id number]  No chief complaint on file.   HPI Patient is in today for follow-up of his 4.5 cm ascending thoracic aortic aneurysm.  This is a stable study.  Unfortunately the patient is known pancreatic cancer and is currently being evaluated for metastatic pulmonary disease.  He has undergone chemotherapy and resection.  He has been treated at [facility].  He is currently under the care of Dr.Rao for oncology.  He describes some anterior chest soreness at times but feels this is musculoskeletal in nature.  He denies shortness of breath or palpitations.  He does have occasional lower extremity edema that he attributes to chemotherapy.  He does have an occasional cough with clear sputum.  He has never had blood in his sputum and denies hemoptysis.        Objective:    BP 127/70   Pulse 65   Resp 20   Ht '6\' 3"'$  (1.905 m)   Wt 181 lb (82.1 kg)   SpO2 95%   BMI 22.62 kg/m  BP Readings from Last 3 Encounters:  02/08/22 127/70  12/17/21 108/63  11/23/21 122/68   Wt Readings from Last 3 Encounters:  02/08/22 181 lb (82.1 kg)  12/17/21 183 lb 3.2 oz (83.1 kg)  11/23/21 180 lb (81.6 kg)    Physical Exam Vitals reviewed.  Constitutional:      General: He is in acute distress.     Appearance: He is not toxic-appearing.  HENT:     Head: Normocephalic and atraumatic.  Cardiovascular:     Rate and Rhythm: Normal rate and regular rhythm.     Pulses: Normal pulses.     Heart sounds: No murmur heard. Pulmonary:     Effort: Pulmonary effort is normal.     Breath sounds: Normal breath sounds. No wheezing, rhonchi or rales.  Abdominal:     General: Abdomen is flat.     Palpations: Abdomen is soft.  Musculoskeletal:     Comments: No lower extremity edema currently  Neurological:     Mental Status: He is alert and oriented to person, place, and time.  Psychiatric:         Judgment: Judgment normal.     No results found for any visits on 02/08/22.      Assessment & Plan:   Problem List Items Addressed This Visit     Thoracic ascending aortic aneurysm ([place]) - Primary    No orders of the defined types were placed in this encounter.  Narrative & Impression  CLINICAL DATA:  Pancreatic cancer restaging, status post distal pancreatectomy and splenectomy, chemotherapy complete * Tracking Code: BO *   EXAM: CT CHEST, ABDOMEN, AND PELVIS WITH CONTRAST   TECHNIQUE: Multidetector CT imaging of the chest, abdomen and pelvis was performed following the standard protocol during bolus administration of intravenous contrast.   RADIATION DOSE REDUCTION: This exam was performed according to the departmental dose-optimization program which includes automated exposure control, adjustment of the mA and/or kV according to patient size and/or use of iterative reconstruction technique.   CONTRAST:  1m OMNIPAQUE IOHEXOL 300 MG/ML SOLN, additional oral enteric contrast   COMPARISON:  CT chest, 10/08/2021, CT abdomen pelvis, 09/28/2021, CT chest abdomen pelvis, 08/04/2021, CT chest, 02/11/2021   FINDINGS: CT CHEST FINDINGS   Cardiovascular: Right chest port catheter. Aortic atherosclerosis. Unchanged enlargement of the tubular  ascending thoracic aorta, measuring up to 4.5 x 4.5 cm (series 2, image 32). Normal heart size. Scattered left and right coronary artery calcifications. No pericardial effusion.   Mediastinum/Nodes: No enlarged mediastinal, hilar, or axillary lymph nodes. Thyroid gland, trachea, and esophagus demonstrate no significant findings.   Lungs/Pleura: Multiple small bilateral pulmonary nodules are again seen, which appear to have slightly enlarged compared to prior examination for example a 0.7 cm nodule of the anterior left apex, previously 0.4 cm (series 3, image 30) and a 0.7 cm nodule in the dependent right lower lobe, previously  0.6 cm (series 3, image 140). These are clearly new and enlarged over a longer period of time in comparison to several prior examinations dated 08/04/2021 and 02/11/2021. No pleural effusion or pneumothorax.   Musculoskeletal: No chest wall mass or suspicious osseous lesions identified.   CT ABDOMEN PELVIS FINDINGS   Hepatobiliary: No solid liver abnormality is seen. No gallstones, gallbladder wall thickening, or biliary dilatation.   Pancreas: Status post distal pancreatectomy. No evidence of recurrent soft tissue at the resection margin. No pancreatic ductal dilatation or surrounding inflammatory changes.   Spleen: Status post splenectomy.   Adrenals/Urinary Tract: Adrenal glands are unremarkable. Kidneys are normal, without renal calculi, solid lesion, or hydronephrosis. Bladder is unremarkable.   Stomach/Bowel: Stomach is within normal limits. Appendix appears normal. No evidence of bowel wall thickening, distention, or inflammatory changes.   Vascular/Lymphatic: Aortic atherosclerosis. 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 No enlarged abdominal or pelvic lymph nodes.   Reproductive: Prostatomegaly.  Biopsy marking clips in the prostate.   Other: No abdominal wall hernia or abnormality. No ascites.   Musculoskeletal: No acute osseous findings. Status post vertebral cement. Unchanged superior endplate deformities of the lumbar spine most notably L2.   IMPRESSION: 1. Status post distal pancreatectomy and splenectomy. No evidence of local recurrence. 2. Multiple small bilateral pulmonary nodules are again seen, which have continued to enlarge and are clearly new and enlarged in comparison to prior examinations dating back to 02/11/2021. Findings are highly concerning for small pulmonary metastases. 3. Unchanged enlargement of the tubular ascending thoracic aorta, measuring up to 4.5 x 4.5 cm. Ascending  thoracic aortic aneurysm. Recommend semi-annual imaging followup by CTA or MRA and referral to cardiothoracic surgery if not already obtained and clinically appropriate. This recommendation follows 2010 ACCF/AHA/AATS/ACR/ASA/SCA/SCAI/SIR/STS/SVM Guidelines for the Diagnosis and Management of Patients With Thoracic Aortic Disease. Circulation. 2010; 121: Y301-S010. Aortic aneurysm NOS (ICD10-I71.9) 4. 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 5. Coronary artery disease.   Aortic Atherosclerosis (ICD10-I70.0).     Electronically Signed   By: Delanna Ahmadi M.D.   On: 12/02/2021 14:28   A/P: Stable tubular a sending thoracic aorta measuring 4.5 x 4.5 cm.  Due to the patient's current evaluation for pulmonary metastatic disease he does not feel that we need to continue monitoring the aneurysm.  He will contact our office if his mind is changed in this regard.  For now we will not schedule a repeat CTA of the chest.  It is highly likely that he would be a candidate for surgical repair and most likely the aneurysm will not achieve a level of 5.0 cm or greater in the near future.  We did discuss lifestyle management with good control of hypertension.  He has an excellent blood pressure on today's visit and says it usually is in this range. No follow-ups on file.  Medhansh Giovanni, PA-C

## 2022-02-08 NOTE — Patient Instructions (Signed)
Good lifestyle management including blood pressure

## 2022-02-09 ENCOUNTER — Other Ambulatory Visit: Payer: Self-pay | Admitting: *Deleted

## 2022-02-09 DIAGNOSIS — I7121 Aneurysm of the ascending aorta, without rupture: Secondary | ICD-10-CM

## 2022-02-09 DIAGNOSIS — C251 Malignant neoplasm of body of pancreas: Secondary | ICD-10-CM

## 2022-02-10 ENCOUNTER — Encounter: Payer: Self-pay | Admitting: *Deleted

## 2022-02-10 ENCOUNTER — Other Ambulatory Visit: Payer: Self-pay | Admitting: *Deleted

## 2022-02-10 DIAGNOSIS — C251 Malignant neoplasm of body of pancreas: Secondary | ICD-10-CM

## 2022-02-10 DIAGNOSIS — I7121 Aneurysm of the ascending aorta, without rupture: Secondary | ICD-10-CM

## 2022-02-18 ENCOUNTER — Telehealth: Payer: Self-pay

## 2022-02-18 NOTE — Telephone Encounter (Signed)
Patient states he is experiencing some symptoms of the spinal problems he has had before.  Patient states he is not sure if the procedure he had worked or if it's something new.  Patient states the symptoms are not as severe, but he is worried about it becoming more severe.  Patient states he has pain in his mid to upper back area which is most pronounced when sitting against the back of a chair.  Patient states he is wondering if Dr. Deborra Medina would like for him to have an x-ray.  Patient states he isn't sure that he will need an appointment since this procedure was fairly recent, and would like to submit a phone message first.

## 2022-02-18 NOTE — Telephone Encounter (Signed)
Pt would like to see about getting an xray of his back again because the same pain has returned just not as bad as before the procedure he had. Pt does not feel that an appt is necessary at this time since the procedure was so recent.

## 2022-02-23 ENCOUNTER — Other Ambulatory Visit: Payer: Self-pay | Admitting: Oncology

## 2022-02-23 DIAGNOSIS — C251 Malignant neoplasm of body of pancreas: Secondary | ICD-10-CM

## 2022-02-23 DIAGNOSIS — I7121 Aneurysm of the ascending aorta, without rupture: Secondary | ICD-10-CM

## 2022-02-23 NOTE — Telephone Encounter (Signed)
Pt scheduled at 11 on 9/20823. He could not come sooner due to trip he has already paid for that is all next week. He does feel that pain is manageable at this time.

## 2022-02-25 ENCOUNTER — Inpatient Hospital Stay: Payer: PPO | Attending: Oncology

## 2022-02-25 ENCOUNTER — Ambulatory Visit
Admission: RE | Admit: 2022-02-25 | Discharge: 2022-02-25 | Disposition: A | Discharge status: Home / Self Care | Payer: PPO | Source: Ambulatory Visit | Attending: Oncology | Admitting: Oncology

## 2022-02-25 DIAGNOSIS — R918 Other nonspecific abnormal finding of lung field: Secondary | ICD-10-CM | POA: Diagnosis not present

## 2022-02-25 DIAGNOSIS — Z9221 Personal history of antineoplastic chemotherapy: Secondary | ICD-10-CM | POA: Diagnosis not present

## 2022-02-25 DIAGNOSIS — C251 Malignant neoplasm of body of pancreas: Secondary | ICD-10-CM | POA: Insufficient documentation

## 2022-02-25 DIAGNOSIS — I722 Aneurysm of renal artery: Secondary | ICD-10-CM | POA: Diagnosis not present

## 2022-02-25 DIAGNOSIS — Z8546 Personal history of malignant neoplasm of prostate: Secondary | ICD-10-CM | POA: Diagnosis not present

## 2022-02-25 DIAGNOSIS — C259 Malignant neoplasm of pancreas, unspecified: Secondary | ICD-10-CM | POA: Diagnosis not present

## 2022-02-25 DIAGNOSIS — J479 Bronchiectasis, uncomplicated: Secondary | ICD-10-CM | POA: Diagnosis not present

## 2022-02-25 DIAGNOSIS — N400 Enlarged prostate without lower urinary tract symptoms: Secondary | ICD-10-CM | POA: Diagnosis not present

## 2022-02-25 DIAGNOSIS — I712 Thoracic aortic aneurysm, without rupture, unspecified: Secondary | ICD-10-CM | POA: Diagnosis not present

## 2022-02-25 DIAGNOSIS — I700 Atherosclerosis of aorta: Secondary | ICD-10-CM | POA: Diagnosis not present

## 2022-02-25 DIAGNOSIS — Z95828 Presence of other vascular implants and grafts: Secondary | ICD-10-CM

## 2022-02-25 DIAGNOSIS — M549 Dorsalgia, unspecified: Secondary | ICD-10-CM | POA: Diagnosis not present

## 2022-02-25 DIAGNOSIS — I7121 Aneurysm of the ascending aorta, without rupture: Secondary | ICD-10-CM | POA: Diagnosis not present

## 2022-02-25 LAB — COMPREHENSIVE METABOLIC PANEL
ALT: 34 U/L (ref 0–44)
AST: 41 U/L (ref 15–41)
Albumin: 3.9 g/dL (ref 3.5–5.0)
Alkaline Phosphatase: 54 U/L (ref 38–126)
Anion gap: 6 (ref 5–15)
BUN: 21 mg/dL (ref 8–23)
CO2: 28 mmol/L (ref 22–32)
Calcium: 8.9 mg/dL (ref 8.9–10.3)
Chloride: 103 mmol/L (ref 98–111)
Creatinine, Ser: 0.77 mg/dL (ref 0.61–1.24)
GFR, Estimated: >60 mL/min (ref >60)
Glucose, Bld: 124 mg/dL — ABNORMAL HIGH (ref 70–99)
Potassium: 3.9 mmol/L (ref 3.5–5.1)
Sodium: 137 mmol/L (ref 135–145)
Total Bilirubin: 0.8 mg/dL (ref 0.3–1.2)
Total Protein: 7 g/dL (ref 6.5–8.1)

## 2022-02-25 LAB — CBC WITH DIFFERENTIAL/PLATELET
Abs Immature Granulocytes: 0.02 10*3/uL (ref 0.00–0.07)
Basophils Absolute: 0 10*3/uL (ref 0.0–0.1)
Basophils Relative: 1 %
Eosinophils Absolute: 0.1 10*3/uL (ref 0.0–0.5)
Eosinophils Relative: 2 %
HCT: 41 % (ref 39.0–52.0)
Hemoglobin: 14.2 g/dL (ref 13.0–17.0)
Immature Granulocytes: 0 %
Lymphocytes Relative: 43 %
Lymphs Abs: 2.5 10*3/uL (ref 0.7–4.0)
MCH: 31.3 pg (ref 26.0–34.0)
MCHC: 34.6 g/dL (ref 30.0–36.0)
MCV: 90.3 fL (ref 80.0–100.0)
Monocytes Absolute: 0.9 10*3/uL (ref 0.1–1.0)
Monocytes Relative: 15 %
Neutro Abs: 2.2 10*3/uL (ref 1.7–7.7)
Neutrophils Relative %: 39 %
Platelets: 247 10*3/uL (ref 150–400)
RBC: 4.54 MIL/uL (ref 4.22–5.81)
RDW: 13.8 % (ref 11.5–15.5)
WBC: 5.8 10*3/uL (ref 4.0–10.5)
nRBC: 0 % (ref 0.0–0.2)

## 2022-02-25 MED ORDER — HEPARIN SOD (PORK) LOCK FLUSH 100 UNIT/ML IV SOLN
500.0000 [IU] | Freq: Once | INTRAVENOUS | Status: AC
  Filled 2022-02-25: qty 5

## 2022-02-25 MED ORDER — IOHEXOL 300 MG/ML  SOLN
100.0000 mL | Freq: Once | INTRAMUSCULAR | Status: AC | PRN
  Administered 2022-02-25: 100 mL via INTRAVENOUS

## 2022-02-25 MED ORDER — HEPARIN SOD (PORK) LOCK FLUSH 100 UNIT/ML IV SOLN
500.0000 [IU] | Freq: Once | INTRAVENOUS | Status: AC
  Administered 2022-02-25: 500 [IU] via INTRAVENOUS

## 2022-02-26 LAB — CANCER ANTIGEN 19-9: CA 19-9: 9 U/mL (ref 0–35)

## 2022-02-27 ENCOUNTER — Other Ambulatory Visit: Payer: Self-pay | Admitting: Oncology

## 2022-02-27 MED ORDER — CIPROFLOXACIN HCL 250 MG PO TABS: 250.0000 mg | ORAL_TABLET | Freq: Two times a day (BID) | ORAL | 0 refills | Status: DC

## 2022-03-03 ENCOUNTER — Other Ambulatory Visit: Payer: PPO

## 2022-03-03 ENCOUNTER — Ambulatory Visit: Payer: PPO

## 2022-03-07 ENCOUNTER — Inpatient Hospital Stay (HOSPITAL_BASED_OUTPATIENT_CLINIC_OR_DEPARTMENT_OTHER): Payer: PPO | Admitting: Oncology

## 2022-03-07 ENCOUNTER — Other Ambulatory Visit: Payer: PPO

## 2022-03-07 ENCOUNTER — Encounter: Payer: Self-pay | Admitting: Oncology

## 2022-03-07 VITALS — BP 103/65 | HR 79 | Temp 97.2°F | Resp 18 | Wt 183.4 lb

## 2022-03-07 DIAGNOSIS — R918 Other nonspecific abnormal finding of lung field: Secondary | ICD-10-CM | POA: Diagnosis not present

## 2022-03-07 DIAGNOSIS — C251 Malignant neoplasm of body of pancreas: Secondary | ICD-10-CM | POA: Diagnosis not present

## 2022-03-07 NOTE — Progress Notes (Signed)
Pt will like to discuss CIPRO directions states BID but only had 7 pills. Has finished the 7 pills so far. Will like to discuss UTI; soreness across his chest down to his rt elbow. Lab test on 02/25/22 glucose was elevated and he fasted for the scan; wondering if scan prep caused it to elevate. Spots on his face; getting close to his eye.

## 2022-03-07 NOTE — Progress Notes (Signed)
Hematology/Oncology Consult note [facility]  [phone] [phone]  Patient Care Team: George Mc, MD as PCP - General (Internal Medicine) George Jacks, RN as Oncology Nurse Navigator   Name of the patient: George Owens  [id number]  Mar 03, 1932   Date of visit: 03/07/22  Diagnosis- stage Ib pancreatic cancer s/p resection and chemotherapy  Chief complaint/ Reason for visit-discuss CT scan results and further management  Heme/Onc history: patient is a 88 year old male with a past medical history significant for GERD, hyperlipidemia, cirrhosis.  He was having symptoms of Abdominal pain and reflux and therefore underwent a CT abdomen and pelvis with contrast on 09/11/2020 which showed a hypointense ill-defined mass in the pancreatic body up to 25 mm.  No extrapancreatic infiltrative density about the aorta or proximal George Owens.  No local regional adenopathy or distant metastatic disease.  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  This was followed by an E[facility] at [facility] which showed a hypoechoic mass measuring 1.8 x 1.6 cm in the pancreatic body.  Endosonographic borders poorly defined.  Upstream pancreatic duct dilatation.  No abnormal appearing lymph nodes.  Endosonographic imaging of the liver showed no lesion.  Biopsy showed adenocarcinoma.   PET scan showed 2.7 x 2 cm ovoid mass in the body of the pancreas with an SUV of 7.1.  18 mm hypermetabolic nodule in the isthmus of the thyroid gland.  No evidence of local regional adenopathy or distant metastatic disease.   Patient was evaluated by Dr. Hyman Owens at [facility] for consideration of surgery and has been deemed to be upfront resectable surgical candidate.  However plan was to offer him neoadjuvant chemotherapy for 3 months to control micrometastatic disease before proceeding with definitive surgery.  If ultimately patient decides not to proceed with surgery then radiation at [facility] also remains an  option.   Patient received 8 doses of gemcitabine and Abraxane chemotherapy 1 week on 1 week off due to neutropenia and thrombocytopenia. Patient underwent laparoscopy surgery by Dr. Hyman Owens at [facility].  Final pathology showed 3 cm grade 3 poorly differentiated adenocarcinoma of the pancreas with negative margins.  15 lymph nodes negative for malignancy.  Absent chemotherapy response with extensive residual cancer and no evidence of tumor regression.  Perineural invasion identified lymphovascular invasion not identified.  Patient completed 4 months of adjuvant gemcitabine Abraxane chemotherapy in January 2023.   Interval history-patient reports that his symptoms of UTI have improved presently.  Has occasional chest wall pain when he wakes up in the morning and it improves on its own.  His back has still been bothering during him after he was found to have a compression fracture of his thoracic spine unrelated to malignancy.  He plans to get in touch with Dr. Derrel Owens about this  ECOG PS- 1 Pain scale- 2 Opioid associated constipation- no  Review of systems- Review of Systems  Constitutional:  Positive for malaise/fatigue. Negative for chills, fever and weight loss.  HENT:  Negative for congestion, ear discharge and nosebleeds.   Eyes:  Negative for blurred vision.  Respiratory:  Negative for cough, hemoptysis, sputum production, shortness of breath and wheezing.   Cardiovascular:  Negative for chest pain, palpitations, orthopnea and claudication.  Gastrointestinal:  Negative for abdominal pain, blood in stool, constipation, diarrhea, heartburn, melena, nausea and vomiting.  Genitourinary:  Negative for dysuria, flank pain, frequency, hematuria and urgency.  Musculoskeletal:  Positive for back pain. Negative for joint pain and myalgias.  Skin:  Negative for  rash.  Neurological:  Negative for dizziness, tingling, focal weakness, seizures, weakness and headaches.  Endo/Heme/Allergies:  Does not  bruise/bleed easily.  Psychiatric/Behavioral:  Negative for depression and suicidal ideas. The patient does not have insomnia.       Allergies  Allergen Reactions   Kenalog [Triamcinolone Acetonide]     Blindness X 3 Days injection     Past Medical History:  Diagnosis Date   Allergy    Aneurysm of abdominal aorta (HCC)    Basal cell carcinoma    right temple Dr. 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     Past Surgical History:  Procedure Laterality Date   CATARACT EXTRACTION Right    CATARACT EXTRACTION W/PHACO Left 12/26/2017   Procedure: CATARACT EXTRACTION PHACO AND INTRAOCULAR LENS PLACEMENT ([place])  COMPLICATED LEFT;  Surgeon: George Koyanagi, MD;  Location: [place];  Service: Ophthalmology;  Laterality: Left;  [place]   colonoscopy     ESOPHAGOGASTRODUODENOSCOPY (EGD) WITH PROPOFOL     EYE MUSCLE SURGERY Left    IR KYPHO THORACIC WITH BONE BIOPSY  11/11/2021   PORTA CATH INSERTION N/A 10/19/2020   Procedure: PORTA CATH INSERTION;  Surgeon: George Huxley, MD;  Location: [place] [facility];  Service: Cardiovascular;  Laterality: N/A;   TONSILLECTOMY AND ADENOIDECTOMY      Social History   Socioeconomic History   Marital status: Married    Spouse name: George Owens   Number of children: 4   Years of education: Not on file   Highest education level: Not on file  Occupational History   Occupation: retired  Tobacco Use   Smoking status: Never   Smokeless tobacco: Never  Vaping Use   Vaping Use: Never used  Substance and Sexual Activity   Alcohol use: No   Drug use: No   Sexual activity: Yes    Partners: Female  Other Topics Concern   Not on file  Social  History Narrative   Lives with wife in a 5 story retirement home.  He lives on the first floor.  4 year college degree.  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     Social Determinants of Health   Financial Resource Strain: Low Risk  (04/13/2020)   Overall Financial Resource Strain (CARDIA)    Difficulty of Paying Living Expenses: Not hard at all  Food Insecurity: No Food Insecurity (04/10/2019)   Hunger Vital Sign    Worried About Running Out of Food in the Last Year: Never true    Ran Out of Food in the Last Year: Never true  Transportation Needs: No Transportation Needs (04/13/2020)   PRAPARE - Hydrologist (Medical): No    Lack of Transportation (Non-Medical): No  Physical Activity: Sufficiently Active (04/10/2019)   Exercise Vital Sign    Days of Exercise per Week: 5 days    Minutes of Exercise per Session: 30 min  Stress: No Stress Concern Present (04/13/2020)   [place]    Feeling of Stress : Not at all  Social Connections: Unknown (04/13/2020)  Social Licensed conveyancer [NHANES]    Frequency of Communication with Friends and Family: Not on file    Frequency of Social Gatherings with Friends and Family: Not on file    Attends Religious Services: Not on file    Active Member of Clubs or Organizations: Not on file    Attends Archivist Meetings: Not on file    Marital Status: Married  Intimate Partner Violence: Not At Risk (04/10/2019)   Humiliation, Afraid, Rape, and Kick questionnaire    Fear of Current or Ex-Partner: No    Emotionally Abused: No    Physically Abused: No    Sexually Abused: No    Family History  Problem Relation Age of Onset   Stroke Mother    Arthritis Father    Heart disease Father    Brain cancer Brother        1/2 brother (possibly related to mother NOT related to patient)   Colon cancer Neg Hx     Esophageal cancer Neg Hx    Pancreatic cancer Neg Hx    Stomach cancer Neg Hx    Liver disease Neg Hx      Current Outpatient Medications:    acetaminophen (TYLENOL) 500 MG tablet, Take 1,000 mg by mouth every 6 (six) hours as needed for moderate pain., Disp: , Rfl:    Calcium Carb-Cholecalciferol (CALCIUM 600 + D PO), Take 1 tablet by mouth daily., Disp: , Rfl:    cholecalciferol (VITAMIN D3) 25 MCG (1000 UNIT) tablet, Take 1,000 Units by mouth daily., Disp: , Rfl:    Cranberry 500 MG CAPS, Take 500 mg by mouth daily., Disp: , Rfl:    CREON 36000-114000 units CPEP capsule, Take 36,000 Units by mouth See admin instructions. Take 36000 units once daily with the largest meal of the day, Disp: , Rfl:    Denosumab (PROLIA [place]), Inject 1 Dose into the skin every 6 (six) months., Disp: , Rfl:    hydrocortisone (ANUSOL-HC) 2.5 % rectal cream, Place 1 application rectally 2 (two) times daily., Disp: 30 g, Rfl: 0   loratadine (CLARITIN) 10 MG tablet, Take 10 mg by mouth daily as needed for allergies., Disp: , Rfl:    metoprolol succinate (TOPROL-XL) 50 MG 24 hr tablet, Take 1.5 tablets (75 mg total) by mouth daily., Disp: 30 tablet, Rfl: 1   Misc Natural Products (JOINT HEALTH) CAPS, Take 1 capsule by mouth daily., Disp: , Rfl:    omeprazole (PRILOSEC) 40 MG capsule, Take 40 mg by mouth daily., Disp: , Rfl:    Polyethyl Glycol-Propyl Glycol (SYSTANE OP), Place 1 drop into both eyes daily., Disp: , Rfl:    Ranibizumab (LUCENTIS IO), Place 1 Dose into the right eye See admin instructions. Every 14 weeks, Disp: , Rfl:    tamsulosin (FLOMAX) 0.4 MG CAPS capsule, TAKE 1 CAPSULE BY MOUTH EVERY DAY AFTER SUPPER, Disp: 90 capsule, Rfl: 1   vitamin B-12 (CYANOCOBALAMIN) 1000 MCG tablet, Take 1,000 mcg by mouth daily., Disp: , Rfl:    ciprofloxacin (CIPRO) 250 MG tablet, Take 1 tablet (250 mg total) by mouth 2 (two) times daily. (Patient not taking: Reported on 03/07/2022), Disp: 7 tablet, Rfl: 0    lidocaine-prilocaine (EMLA) cream, Apply to affected area once (Patient not taking: Reported on 02/08/2022), Disp: 30 g, Rfl: 3   temazepam (RESTORIL) 30 MG capsule, TAKE 1 CAPSULE BY MOUTH AT BEDTIME AS NEEDED FOR SLEEP (Patient not taking: Reported on 03/07/2022), Disp: 30 capsule, Rfl: 3 No  current facility-administered medications for this visit.  Facility-Administered Medications Ordered in Other Visits:    heparin lock flush 100 UNIT/ML injection, , , ,    heparin lock flush 100 unit/mL, 500 Units, Intravenous, Once, Sindy Guadeloupe, MD  Physical exam:  Vitals:   03/07/22 0928  BP: 103/65  Pulse: 79  Resp: 18  Temp: (!) 97.2 F (36.2 C)  SpO2: 97%  Weight: 183 lb 6.4 oz (83.2 kg)   Physical Exam Constitutional:      General: He is not in acute distress. Cardiovascular:     Rate and Rhythm: Normal rate and regular rhythm.     Heart sounds: Normal heart sounds.  Pulmonary:     Effort: Pulmonary effort is normal.     Breath sounds: Normal breath sounds.  Abdominal:     General: Bowel sounds are normal.     Palpations: Abdomen is soft.  Skin:    General: Skin is warm and dry.  Neurological:     Mental Status: He is alert and oriented to person, place, and time.         Latest Ref Rng & Units 02/25/2022    8:37 AM  CMP  Glucose 70 - 99 mg/dL 124   BUN 8 - 23 mg/dL 21   Creatinine 0.61 - 1.24 mg/dL 0.77   Sodium 135 - 145 mmol/L 137   Potassium 3.5 - 5.1 mmol/L 3.9   Chloride 98 - 111 mmol/L 103   CO2 22 - 32 mmol/L 28   Calcium 8.9 - 10.3 mg/dL 8.9   Total Protein 6.5 - 8.1 g/dL 7.0   Total Bilirubin 0.3 - 1.2 mg/dL 0.8   Alkaline Phos 38 - 126 U/L 54   AST 15 - 41 U/L 41   ALT 0 - 44 U/L 34       Latest Ref Rng & Units 02/25/2022    8:37 AM  CBC  WBC 4.0 - 10.5 K/uL 5.8   Hemoglobin 13.0 - 17.0 g/dL 14.2   Hematocrit 39.0 - 52.0 % 41.0   Platelets 150 - 400 K/uL 247     No images are attached to the encounter.  CT CHEST ABDOMEN PELVIS W  CONTRAST  Result Date: 02/27/2022 CLINICAL DATA:  Pancreatic cancer restaging. Surgery 2 new pancreatic mass in 02/2021. Chemotherapy completed in 06/2021. Also follow-up AA seen on prior CT done 12/01/2021. Patient complains of new chest pain in the morning for a month. No known injury. History of prostate cancer with radiation treatments in 2006. EXAM: CT CHEST, ABDOMEN, AND PELVIS WITH CONTRAST TECHNIQUE: Multidetector CT imaging of the chest, abdomen and pelvis was performed following the standard protocol during bolus administration of intravenous contrast. RADIATION DOSE REDUCTION: This exam was performed according to the departmental dose-optimization program which includes automated exposure control, adjustment of the mA and/or kV according to patient size and/or use of iterative reconstruction technique. CONTRAST:  197m OMNIPAQUE IOHEXOL 300 MG/ML  SOLN COMPARISON:  CT chest, abdomen and pelvis dated 12/01/2021. FINDINGS: CT CHEST FINDINGS Cardiovascular: Stable aneurysm of the ascending thoracic aorta, measuring 4.6 x 4.5 cm. Remainder of the thoracic aorta is normal in caliber. Scattered aortic atherosclerosis. No pericardial effusion. Mediastinum/Nodes: No mass or enlarged lymph nodes are seen within the mediastinum, perihilar or axillary regions. Esophagus is unremarkable. Trachea and central bronchi are unremarkable. Lungs/Pleura: Bilateral bronchiectasis, lower lobe predominant. There are 4 pulmonary nodules which were described on the previous which have been described on the most recent chest CTs  of 12/01/2021 and 10/08/2021. Each of these pulmonary nodules have increased in size compared to the previous exams as follows: LEFT lung apex nodule measures 8 mm (series 3, image 29), measuring 6 mm on chest CT of 12/01/2021 and 4 mm on chest CT of 10/08/2021. More superior of 2 pulmonary nodules in the RIGHT lower lobe, posterior aspect, measures 8 mm (series 3, image 141), measuring 7 mm on chest CT  of 12/01/2021 and measuring 6 mm on chest CT of 10/08/2021. More inferior of 2 pulmonary nodules in the RIGHT lower lobe, posterior aspect, measures 8 mm, measuring 6 mm on chest CT of 12/01/2021 and 5 mm on chest CT of 10/08/2021. LEFT lower lobe pulmonary nodule, posterior aspect, measures 8 mm (series 3, image 150), measuring 5 mm on chest CT of 10/08/2021. No new pulmonary nodules are seen within either lung. Stable pleuroparenchymal scarring at the bilateral lung apices. No pleural effusion. Musculoskeletal: Subtle cortical lucency/defect is seen within the lateral LEFT fifth rib. Subtle sclerotic foci are seen within the LEFT lateral sixth and eighth ribs. These could represent early osseous metastases but are not convincing. Stable chronic compression fracture deformity of a lower thoracic vertebral body. No acute findings within the thoracic spine. CT ABDOMEN PELVIS FINDINGS Hepatobiliary: No focal liver abnormality is seen. Layering sludge and/or stones within the otherwise normal-appearing gallbladder. No bile duct dilatation is seen. Pancreas: Status post partial pancreatic resection. Pancreatic head/uncinate region is unremarkable. No evidence of recurrent mass. Spleen: Status post splenectomy. Adrenals/Urinary Tract: Adrenal glands are unremarkable. Kidneys appear normal without mass, stone or hydronephrosis. Bladder is unremarkable. Stomach/Bowel: No dilated large or small bowel loops. No evidence of bowel wall inflammation. Appendix appears normal. Stomach is unremarkable. Vascular/Lymphatic: Aortic atherosclerosis. No abdominal aortic aneurysm. No acute-appearing vascular abnormality. 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 No enlarged lymph nodes are seen in the abdomen or pelvis. Reproductive: Moderate prostate gland enlargement. Biopsy marking clips are again seen in the prostate. Other: No free fluid or abscess collection. No free intraperitoneal air.  Musculoskeletal: No acute-appearing osseous abnormality or evidence of osseous metastasis. Degenerative spondylosis of the lumbar spine, moderate in degree. Stable/chronic compression deformity of the L2 vertebral body superior endplate. IMPRESSION: 1. Each of the 4 pulmonary nodules described on previous CTs of 12/01/2021 and 10/08/2021 have continued to increase in size and are new compared to a CT of 02/11/2021, as detailed above, almost certainly representing metastatic pulmonary nodules. 2. Stable aneurysm of the ascending thoracic aorta, measuring 4.6 x 4.5 cm. Ascending thoracic aortic aneurysm. Recommend semi-annual imaging followup by CTA or MRA and referral to cardiothoracic surgery if not already obtained. This recommendation follows 2010 ACCF/AHA/AATS/ACR/ASA/SCA/SCAI/SIR/STS/SVM Guidelines for the Diagnosis and Management of Patients With Thoracic Aortic Disease. Circulation. 2010; 121: W431-V400. Aortic aneurysm NOS (ICD10-I71.9) 3. Status post partial pancreatic resection and splenectomy. No evidence of local recurrence. 4. No evidence of metastatic disease within the abdomen or pelvis. 5. Moderate prostate gland enlargement. Biopsy marking clips are again seen in the prostate. 6. Layering sludge and/or stones within the otherwise normal-appearing gallbladder. No evidence of acute cholecystitis. 7. 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 Aortic Atherosclerosis (ICD10-I70.0). Electronically Signed   By: Franki Cabot M.D.   On: 02/27/2022 12:57     Assessment and plan- Patient is a 88 y.o. male with history of stage IB pancreatic cancer s/p neoadjuvant chemotherapy and surgery here to discuss CT scan results and further management  I have reviewed CT chest abdomen and pelvis images independently and discussed findings with the patient which shows gradual but consistent enlargement in his lung nodules from 4 boulders back in April 2023 presently to 8 mm which is  concerning for metastatic disease.  However these nodules are still subcentimeter and difficult to be biopsied.  I have discussed this case with radiology as well and they recommend getting a PET CT scan in the future to see if they would be amenable to biopsy.  I will see him back in 3 months time with a PET scan prior.  I am not planning for any treatment at this time for his lung nodules unless we can biopsy and prove what is going on.   Patient reports ongoing back pain.  CT scans did not pick up any acute abnormality in his thoracic spine but he would like to get in touch with Dr. Derrel Owens about this and get an x-ray.    Visit Diagnosis 1. Malignant neoplasm of body of pancreas ([place])   2. Lung nodules      Dr. Randa Evens, MD, MPH [facility] at [facility] [phone] 03/07/2022 12:56 PM

## 2022-03-09 ENCOUNTER — Encounter: Payer: Self-pay | Admitting: Internal Medicine

## 2022-03-09 ENCOUNTER — Ambulatory Visit (INDEPENDENT_AMBULATORY_CARE_PROVIDER_SITE_OTHER): Payer: PPO | Admitting: Internal Medicine

## 2022-03-09 DIAGNOSIS — S22070A Wedge compression fracture of T9-T10 vertebra, initial encounter for closed fracture: Secondary | ICD-10-CM | POA: Diagnosis not present

## 2022-03-09 DIAGNOSIS — F418 Other specified anxiety disorders: Secondary | ICD-10-CM

## 2022-03-09 DIAGNOSIS — M818 Other osteoporosis without current pathological fracture: Secondary | ICD-10-CM | POA: Diagnosis not present

## 2022-03-09 NOTE — Progress Notes (Signed)
Subjective:  Patient ID: George Owens, male    DOB: [birth year]  Age: 88 y.o. MRN: [id number]  CC: The primary encounter diagnosis was Other osteoporosis without current pathological fracture. Diagnoses of Compression fracture of T9 vertebra, initial encounter ([place]) and Anxiety about health were also pertinent to this visit.   HPI George Owens presents for recurrent spinal /back pain   Chief Complaint  Patient presents with   Follow-up    Follow up on spinal issues   88 yr old male with osteoporosis, History of T9 vertebral fracture despite use of Prolia, treated with kyphoplasty , remote L2 fracture. ascending aortic aneurysm,  stable by CT chest Sept 2023,  prostate cancer in 2006 treated with XRT , ancreatic cancer diagnosed in 2021 with possible pulmonary/bone mets,  resents with recurrence of low back pain that had been present for 2 weeks but has currently resolved. The back pain started spontaneously 2 weeks ago , in the same location as his previous T9  fracture . He noticed it when he leaned back into a straight back chair  and applied direct pressure on his spine when sitting in a chair.   2) Osteoporosis:  has been Taking Prolia for osteoporosis for the papst 5 years.  Last DEXA 2019 (prior to diagnosis of pancreatic CA ).  He sustained the T9 fracture in 2023 .  He has been taking  a supplement for bone health (unclear what he is referring to )    3) Pancreatic ca with possible lung mets. He is awaiting  PET scan which has been ordered by Oncology .    4) GAD: sleeping well without temazepam . He denies feeling of anxiety despite the uncertainty of his prognosis.      Outpatient Medications Prior to Visit  Medication Sig Dispense Refill   acetaminophen (TYLENOL) 500 MG tablet Take 1,000 mg by mouth every 6 (six) hours as needed for moderate pain.     Calcium Carb-Cholecalciferol (CALCIUM 600 + D PO) Take 1 tablet by mouth daily.     cholecalciferol (VITAMIN D3) 25 MCG  (1000 UNIT) tablet Take 1,000 Units by mouth daily.     Cranberry 500 MG CAPS Take 500 mg by mouth daily.     CREON 36000-114000 units CPEP capsule Take 36,000 Units by mouth See admin instructions. Take 36000 units once daily with the largest meal of the day     Denosumab (PROLIA [place]) Inject 1 Dose into the skin every 6 (six) months.     hydrocortisone (ANUSOL-HC) 2.5 % rectal cream Place 1 application rectally 2 (two) times daily. 30 g 0   loratadine (CLARITIN) 10 MG tablet Take 10 mg by mouth daily as needed for allergies.     metoprolol succinate (TOPROL-XL) 50 MG 24 hr tablet Take 1.5 tablets (75 mg total) by mouth daily. 30 tablet 1   omeprazole (PRILOSEC) 40 MG capsule Take 40 mg by mouth daily.     Polyethyl Glycol-Propyl Glycol (SYSTANE OP) Place 1 drop into both eyes daily.     Ranibizumab (LUCENTIS IO) Place 1 Dose into the right eye See admin instructions. Every 14 weeks     tamsulosin (FLOMAX) 0.4 MG CAPS capsule TAKE 1 CAPSULE BY MOUTH EVERY DAY AFTER SUPPER 90 capsule 1   vitamin B-12 (CYANOCOBALAMIN) 1000 MCG tablet Take 1,000 mcg by mouth daily.     lidocaine-prilocaine (EMLA) cream Apply to affected area once (Patient not taking: Reported on 02/08/2022) 30 g 3  temazepam (RESTORIL) 30 MG capsule TAKE 1 CAPSULE BY MOUTH AT BEDTIME AS NEEDED FOR SLEEP (Patient not taking: Reported on 03/07/2022) 30 capsule 3   ciprofloxacin (CIPRO) 250 MG tablet Take 1 tablet (250 mg total) by mouth 2 (two) times daily. (Patient not taking: Reported on 03/07/2022) 7 tablet 0   Misc Natural Products (JOINT HEALTH) CAPS Take 1 capsule by mouth daily. (Patient not taking: Reported on 03/09/2022)     Facility-Administered Medications Prior to Visit  Medication Dose Route Frequency Provider Last Rate Last Admin   heparin lock flush 100 UNIT/ML injection            heparin lock flush 100 unit/mL  500 Units Intravenous Once Sindy Guadeloupe, MD        Review of Systems;  Patient denies headache, fevers,  malaise, unintentional weight loss, skin rash, eye pain, sinus congestion and sinus pain, sore throat, dysphagia,  hemoptysis , cough, dyspnea, wheezing, chest pain, palpitations, orthopnea, edema, abdominal pain, nausea, melena, diarrhea, constipation, flank pain, dysuria, hematuria, urinary  Frequency, nocturia, numbness, tingling, seizures,  Focal weakness, Loss of consciousness,  Tremor, insomnia, depression, anxiety, and suicidal ideation.      Objective:  There were no vitals taken for this visit.  BP Readings from Last 3 Encounters:  03/07/22 103/65  02/08/22 127/70  12/17/21 108/63    Wt Readings from Last 3 Encounters:  03/07/22 183 lb 6.4 oz (83.2 kg)  02/08/22 181 lb (82.1 kg)  12/17/21 183 lb 3.2 oz (83.1 kg)    General appearance: alert, cooperative and appears stated age Ears: normal TM's and external ear canals both ears Throat: lips, mucosa, and tongue normal; teeth and gums normal Neck: no adenopathy, no carotid bruit, supple, symmetrical, trachea midline and thyroid not enlarged, symmetric, no tenderness/mass/nodules Back: symmetric, no curvature. ROM normal. No CVA tenderness. Lungs: clear to auscultation bilaterally Heart: regular rate and rhythm, S1, S2 normal, no murmur, click, rub or gallop Abdomen: soft, non-tender; bowel sounds normal; no masses,  no organomegaly Pulses: 2+ and symmetric Skin: Skin color, texture, turgor normal. No rashes or lesions Lymph nodes: Cervical, supraclavicular, and axillary nodes normal. Neuro:  awake and interactive with normal mood and affect. Higher cortical functions are normal. Speech is clear without word-finding difficulty or dysarthria. Extraocular movements are intact. Visual fields of both eyes are grossly intact. Sensation to light touch is grossly intact bilaterally of upper and lower extremities. Motor examination shows 4+/5 symmetric hand grip and upper extremity and 5/5 lower extremity strength. There is no pronation or  drift. Gait is non-ataxic   No results found for: "HGBA1C"  Lab Results  Component Value Date   CREATININE 0.77 02/25/2022   CREATININE 0.88 12/01/2021   CREATININE 0.90 11/11/2021    Lab Results  Component Value Date   WBC 5.8 02/25/2022   HGB 14.2 02/25/2022   HCT 41.0 02/25/2022   PLT 247 02/25/2022   GLUCOSE 124 (H) 02/25/2022   CHOL 194 09/15/2021   TRIG 130.0 09/15/2021   HDL 53.70 09/15/2021   LDLDIRECT 129.0 03/31/2017   LDLCALC 115 (H) 09/15/2021   ALT 34 02/25/2022   AST 41 02/25/2022   NA 137 02/25/2022   K 3.9 02/25/2022   CL 103 02/25/2022   CREATININE 0.77 02/25/2022   BUN 21 02/25/2022   CO2 28 02/25/2022   TSH 5.649 (H) 07/20/2021   PSA 0.51 04/27/2020   INR 1.0 11/11/2021    CT CHEST ABDOMEN PELVIS W CONTRAST  Result Date:  02/27/2022 CLINICAL DATA:  Pancreatic cancer restaging. Surgery 2 new pancreatic mass in 02/2021. Chemotherapy completed in 06/2021. Also follow-up AA seen on prior CT done 12/01/2021. Patient complains of new chest pain in the morning for a month. No known injury. History of prostate cancer with radiation treatments in 2006. EXAM: CT CHEST, ABDOMEN, AND PELVIS WITH CONTRAST TECHNIQUE: Multidetector CT imaging of the chest, abdomen and pelvis was performed following the standard protocol during bolus administration of intravenous contrast. RADIATION DOSE REDUCTION: This exam was performed according to the departmental dose-optimization program which includes automated exposure control, adjustment of the mA and/or kV according to patient size and/or use of iterative reconstruction technique. CONTRAST:  193m OMNIPAQUE IOHEXOL 300 MG/ML  SOLN COMPARISON:  CT chest, abdomen and pelvis dated 12/01/2021. FINDINGS: CT CHEST FINDINGS Cardiovascular: Stable aneurysm of the ascending thoracic aorta, measuring 4.6 x 4.5 cm. Remainder of the thoracic aorta is normal in caliber. Scattered aortic atherosclerosis. No pericardial effusion. Mediastinum/Nodes:  No mass or enlarged lymph nodes are seen within the mediastinum, perihilar or axillary regions. Esophagus is unremarkable. Trachea and central bronchi are unremarkable. Lungs/Pleura: Bilateral bronchiectasis, lower lobe predominant. There are 4 pulmonary nodules which were described on the previous which have been described on the most recent chest CTs of 12/01/2021 and 10/08/2021. Each of these pulmonary nodules have increased in size compared to the previous exams as follows: LEFT lung apex nodule measures 8 mm (series 3, image 29), measuring 6 mm on chest CT of 12/01/2021 and 4 mm on chest CT of 10/08/2021. More superior of 2 pulmonary nodules in the RIGHT lower lobe, posterior aspect, measures 8 mm (series 3, image 141), measuring 7 mm on chest CT of 12/01/2021 and measuring 6 mm on chest CT of 10/08/2021. More inferior of 2 pulmonary nodules in the RIGHT lower lobe, posterior aspect, measures 8 mm, measuring 6 mm on chest CT of 12/01/2021 and 5 mm on chest CT of 10/08/2021. LEFT lower lobe pulmonary nodule, posterior aspect, measures 8 mm (series 3, image 150), measuring 5 mm on chest CT of 10/08/2021. No new pulmonary nodules are seen within either lung. Stable pleuroparenchymal scarring at the bilateral lung apices. No pleural effusion. Musculoskeletal: Subtle cortical lucency/defect is seen within the lateral LEFT fifth rib. Subtle sclerotic foci are seen within the LEFT lateral sixth and eighth ribs. These could represent early osseous metastases but are not convincing. Stable chronic compression fracture deformity of a lower thoracic vertebral body. No acute findings within the thoracic spine. CT ABDOMEN PELVIS FINDINGS Hepatobiliary: No focal liver abnormality is seen. Layering sludge and/or stones within the otherwise normal-appearing gallbladder. No bile duct dilatation is seen. Pancreas: Status post partial pancreatic resection. Pancreatic head/uncinate region is unremarkable. No evidence of recurrent  mass. Spleen: Status post splenectomy. Adrenals/Urinary Tract: Adrenal glands are unremarkable. Kidneys appear normal without mass, stone or hydronephrosis. Bladder is unremarkable. Stomach/Bowel: No dilated large or small bowel loops. No evidence of bowel wall inflammation. Appendix appears normal. Stomach is unremarkable. Vascular/Lymphatic: Aortic atherosclerosis. No abdominal aortic aneurysm. No acute-appearing vascular abnormality. 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 No enlarged lymph nodes are seen in the abdomen or pelvis. Reproductive: Moderate prostate gland enlargement. Biopsy marking clips are again seen in the prostate. Other: No free fluid or abscess collection. No free intraperitoneal air. Musculoskeletal: No acute-appearing osseous abnormality or evidence of osseous metastasis. Degenerative spondylosis of the lumbar spine, moderate in degree. Stable/chronic compression deformity of the L2 vertebral body superior  endplate. IMPRESSION: 1. Each of the 4 pulmonary nodules described on previous CTs of 12/01/2021 and 10/08/2021 have continued to increase in size and are new compared to a CT of 02/11/2021, as detailed above, almost certainly representing metastatic pulmonary nodules. 2. Stable aneurysm of the ascending thoracic aorta, measuring 4.6 x 4.5 cm. Ascending thoracic aortic aneurysm. Recommend semi-annual imaging followup by CTA or MRA and referral to cardiothoracic surgery if not already obtained. This recommendation follows 2010 ACCF/AHA/AATS/ACR/ASA/SCA/SCAI/SIR/STS/SVM Guidelines for the Diagnosis and Management of Patients With Thoracic Aortic Disease. Circulation. 2010; 121: C588-F027. Aortic aneurysm NOS (ICD10-I71.9) 3. Status post partial pancreatic resection and splenectomy. No evidence of local recurrence. 4. No evidence of metastatic disease within the abdomen or pelvis. 5. Moderate prostate gland enlargement. Biopsy marking clips are again seen in  the prostate. 6. Layering sludge and/or stones within the otherwise normal-appearing gallbladder. No evidence of acute cholecystitis. 7. 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 Aortic Atherosclerosis (ICD10-I70.0). Electronically Signed   By: Franki Cabot M.D.   On: 02/27/2022 12:57    Assessment & Plan:   Problem List Items Addressed This Visit     Osteoporosis - Primary    Last dose was Spring 2023.  He has not had DEXA since 2019.  Ordered       Relevant Orders   DG Bone Density   Compression fracture of T9 vertebra (HCC)    Repeat films are negative for recurrent fracture and his pain has resolved       Anxiety about health    He is managing his anxiety with meditation but requiring alprazolam at night        I spent a total of   minutes with this patient in a face to face visit on the date of this encounter reviewing the last office visit with me in       ,  most recent visit with cardiology ,    ,  patient's diet and exercise habits, home blood pressure /blod sugar readings, recent ER visit including labs and imaging studies ,   and post visit ordering of testing and therapeutics.    Follow-up: No follow-ups on file.   Crecencio Mc, MD

## 2022-03-12 NOTE — Assessment & Plan Note (Signed)
He is managing his anxiety with meditation but requiring alprazolam at night

## 2022-03-12 NOTE — Assessment & Plan Note (Addendum)
Repeat films are negative for recurrent fracture and his pain has resolved

## 2022-03-12 NOTE — Assessment & Plan Note (Signed)
Last dose was Spring 2023.  He has not had DEXA since 2019.  Ordered

## 2022-03-16 DIAGNOSIS — I480 Paroxysmal atrial fibrillation: Secondary | ICD-10-CM | POA: Diagnosis not present

## 2022-03-16 DIAGNOSIS — M810 Age-related osteoporosis without current pathological fracture: Secondary | ICD-10-CM | POA: Diagnosis not present

## 2022-03-16 DIAGNOSIS — G4700 Insomnia, unspecified: Secondary | ICD-10-CM | POA: Diagnosis not present

## 2022-03-16 DIAGNOSIS — N400 Enlarged prostate without lower urinary tract symptoms: Secondary | ICD-10-CM | POA: Diagnosis not present

## 2022-03-16 DIAGNOSIS — I7121 Aneurysm of the ascending aorta, without rupture: Secondary | ICD-10-CM | POA: Diagnosis not present

## 2022-03-16 DIAGNOSIS — H353223 Exudative age-related macular degeneration, left eye, with inactive scar: Secondary | ICD-10-CM | POA: Diagnosis not present

## 2022-03-16 DIAGNOSIS — K219 Gastro-esophageal reflux disease without esophagitis: Secondary | ICD-10-CM | POA: Diagnosis not present

## 2022-03-16 DIAGNOSIS — C259 Malignant neoplasm of pancreas, unspecified: Secondary | ICD-10-CM | POA: Diagnosis not present

## 2022-03-16 DIAGNOSIS — E041 Nontoxic single thyroid nodule: Secondary | ICD-10-CM | POA: Diagnosis not present

## 2022-03-16 DIAGNOSIS — E785 Hyperlipidemia, unspecified: Secondary | ICD-10-CM | POA: Diagnosis not present

## 2022-03-16 DIAGNOSIS — Z9849 Cataract extraction status, unspecified eye: Secondary | ICD-10-CM | POA: Diagnosis not present

## 2022-03-16 DIAGNOSIS — M1990 Unspecified osteoarthritis, unspecified site: Secondary | ICD-10-CM | POA: Diagnosis not present

## 2022-03-18 ENCOUNTER — Ambulatory Visit (INDEPENDENT_AMBULATORY_CARE_PROVIDER_SITE_OTHER): Payer: PPO

## 2022-03-18 DIAGNOSIS — M818 Other osteoporosis without current pathological fracture: Secondary | ICD-10-CM | POA: Diagnosis not present

## 2022-03-18 MED ORDER — DENOSUMAB 60 MG/ML ~~LOC~~ SOSY
60.0000 mg | PREFILLED_SYRINGE | Freq: Once | SUBCUTANEOUS | Status: AC
  Administered 2022-03-18: 60 mg via SUBCUTANEOUS

## 2022-03-18 NOTE — Progress Notes (Signed)
Pt presented for his Prolia  SUBQ injection. Pt was identified through two identifiers. Pt tolerated shot well in right arm.

## 2022-04-04 ENCOUNTER — Telehealth: Payer: Self-pay

## 2022-04-04 NOTE — Telephone Encounter (Signed)
Filled out [employer] provider portion of patient assistance form for [facility]. Faxed form to [phone]

## 2022-04-06 DIAGNOSIS — H353223 Exudative age-related macular degeneration, left eye, with inactive scar: Secondary | ICD-10-CM | POA: Diagnosis not present

## 2022-04-06 DIAGNOSIS — H43813 Vitreous degeneration, bilateral: Secondary | ICD-10-CM | POA: Diagnosis not present

## 2022-04-06 DIAGNOSIS — H353211 Exudative age-related macular degeneration, right eye, with active choroidal neovascularization: Secondary | ICD-10-CM | POA: Diagnosis not present

## 2022-04-06 DIAGNOSIS — H35433 Paving stone degeneration of retina, bilateral: Secondary | ICD-10-CM | POA: Diagnosis not present

## 2022-04-18 ENCOUNTER — Encounter (INDEPENDENT_AMBULATORY_CARE_PROVIDER_SITE_OTHER): Payer: Self-pay

## 2022-04-21 ENCOUNTER — Inpatient Hospital Stay: Payer: PPO

## 2022-04-21 ENCOUNTER — Inpatient Hospital Stay: Payer: PPO | Attending: Oncology | Admitting: Licensed Clinical Social Worker

## 2022-04-21 ENCOUNTER — Encounter: Payer: Self-pay | Admitting: Licensed Clinical Social Worker

## 2022-04-21 DIAGNOSIS — Z08 Encounter for follow-up examination after completed treatment for malignant neoplasm: Secondary | ICD-10-CM

## 2022-04-21 DIAGNOSIS — C251 Malignant neoplasm of body of pancreas: Secondary | ICD-10-CM | POA: Diagnosis not present

## 2022-04-21 DIAGNOSIS — Z8546 Personal history of malignant neoplasm of prostate: Secondary | ICD-10-CM | POA: Diagnosis not present

## 2022-04-21 LAB — CBC WITH DIFFERENTIAL/PLATELET
Abs Immature Granulocytes: 0.01 10*3/uL (ref 0.00–0.07)
Basophils Absolute: 0 10*3/uL (ref 0.0–0.1)
Basophils Relative: 1 %
Eosinophils Absolute: 0.1 10*3/uL (ref 0.0–0.5)
Eosinophils Relative: 1 %
HCT: 41.3 % (ref 39.0–52.0)
Hemoglobin: 14 g/dL (ref 13.0–17.0)
Immature Granulocytes: 0 %
Lymphocytes Relative: 37 %
Lymphs Abs: 2.1 10*3/uL (ref 0.7–4.0)
MCH: 31.3 pg (ref 26.0–34.0)
MCHC: 33.9 g/dL (ref 30.0–36.0)
MCV: 92.2 fL (ref 80.0–100.0)
Monocytes Absolute: 0.7 10*3/uL (ref 0.1–1.0)
Monocytes Relative: 12 %
Neutro Abs: 2.8 10*3/uL (ref 1.7–7.7)
Neutrophils Relative %: 49 %
Platelets: 256 10*3/uL (ref 150–400)
RBC: 4.48 MIL/uL (ref 4.22–5.81)
RDW: 14.3 % (ref 11.5–15.5)
WBC: 5.7 10*3/uL (ref 4.0–10.5)
nRBC: 0 % (ref 0.0–0.2)

## 2022-04-21 LAB — COMPREHENSIVE METABOLIC PANEL
ALT: 36 U/L (ref 0–44)
AST: 41 U/L (ref 15–41)
Albumin: 3.9 g/dL (ref 3.5–5.0)
Alkaline Phosphatase: 56 U/L (ref 38–126)
Anion gap: 9 (ref 5–15)
BUN: 25 mg/dL — ABNORMAL HIGH (ref 8–23)
CO2: 28 mmol/L (ref 22–32)
Calcium: 9 mg/dL (ref 8.9–10.3)
Chloride: 102 mmol/L (ref 98–111)
Creatinine, Ser: 0.87 mg/dL (ref 0.61–1.24)
GFR, Estimated: >60 mL/min (ref >60)
Glucose, Bld: 179 mg/dL — ABNORMAL HIGH (ref 70–99)
Potassium: 4 mmol/L (ref 3.5–5.1)
Sodium: 139 mmol/L (ref 135–145)
Total Bilirubin: 0.8 mg/dL (ref 0.3–1.2)
Total Protein: 7.2 g/dL (ref 6.5–8.1)

## 2022-04-21 NOTE — Progress Notes (Signed)
REFERRING PROVIDER: Sindy Guadeloupe, MD [place],  [place] [address]  PRIMARY PROVIDER:  Crecencio Mc, MD  PRIMARY REASON FOR VISIT:  1. Malignant neoplasm of body of pancreas ([place])   2. History of prostate cancer      HISTORY OF PRESENT ILLNESS:   George Owens, a 88 y.o. male, was seen for a [facility] cancer genetics consultation at the request of Dr. Janese Banks due to a personal history of pancreatic cancer.  George Owens presents to clinic today to discuss the possibility of a hereditary predisposition to cancer, genetic testing, and to further clarify his future cancer risks, as well as potential cancer risks for family members.    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 This was treated with radiation. In 2022, at the age of 52, George Owens was diagnosed with pancreatic cancer. This was treated with chemotherapy. He has also had skin cancer in the past, BCC. He reports a few polyps on colonoscopy.  Past Medical History:  Diagnosis Date   Allergy    Aneurysm of abdominal aorta (HCC)    Basal cell carcinoma    right temple Dr. 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   Colon polyps    COVID-19 virus infection 03/16/2020   GERD (gastroesophageal reflux disease)    history of prostate CA 2006   Hyperlipidemia    Leg cramps    Neuropathy    Osteoporosis    Pancreas cancer ([place])    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     Past Surgical History:  Procedure Laterality Date   CATARACT EXTRACTION Right    CATARACT EXTRACTION W/PHACO Left 12/26/2017   Procedure: CATARACT EXTRACTION PHACO AND INTRAOCULAR LENS PLACEMENT ([place])  COMPLICATED LEFT;  Surgeon: Leandrew Koyanagi, MD;  Location: [facility];  Service: Ophthalmology;  Laterality: Left;  [place]   colonoscopy     ESOPHAGOGASTRODUODENOSCOPY (EGD) WITH PROPOFOL     EYE MUSCLE SURGERY Left    IR KYPHO THORACIC WITH BONE BIOPSY  11/11/2021   PORTA CATH INSERTION N/A 10/19/2020   Procedure: PORTA CATH INSERTION;  Surgeon: Algernon Huxley, MD;  Location: [facility];  Service: Cardiovascular;  Laterality: N/A;   TONSILLECTOMY AND ADENOIDECTOMY      FAMILY HISTORY:  We obtained a detailed, 4-generation family history.  Significant diagnoses are listed below: Family History  Problem Relation Age of Onset   Stroke Mother    Arthritis Father    Heart disease Father    Brain cancer Brother        1/2 brother (possibly related to mother NOT related to patient)   Cancer Cousin        unk type   Lymphoma Cousin        vs leukemia   Colon cancer Neg Hx    Esophageal cancer Neg Hx    Pancreatic cancer Neg Hx  Stomach cancer Neg Hx    Liver disease Neg Hx    George Owens has 2 sons, 2 daughters. One of his daughters has had skin cancer, BCC or SCC. Patient has 1 sister, 51, no cancers. He has 2 paternal half brothers. One passed of brain cancer. His mother (not related to patient) had the same type of brain cancer.   George Owens father passed at 61 due to heart issues. No known cancers on this side of the family.  George Owens mother passed at 19 of a stroke. Patient had 3 maternal aunts, 1 uncle. One aunt's daughter had cancer, unknown type, and her daughter and son both have had lymphoma vs leukemia. Maternal grandparents passed at an early age in flu epidemics.   George Owens is unaware of previous family history of  genetic testing for hereditary cancer risks. There is no reported Ashkenazi Jewish ancestry. There is no known consanguinity.    GENETIC COUNSELING ASSESSMENT: George Owens is a 88 y.o. male with a personal history of pancreatic and prostate cancer which is somewhat suggestive of a hereditary cancer syndrome and predisposition to cancer. We, therefore, discussed and recommended the following at today's visit.   DISCUSSION: We discussed that approximately 10% of pancreatic cancer is hereditary. Most cases of hereditary pancreatic/prostate cancer are associated with BRCA1/BRCA2 genes, although there are other genes associated with hereditary cancer as well. Cancers and risks are gene specific. We discussed that testing is beneficial for several reasons including knowing about cancer risks, identifying potential screening and risk-reduction options that may be appropriate, and to understand if other family members could be at risk for cancer and allow them to undergo genetic testing.   We reviewed the characteristics, features and inheritance patterns of hereditary cancer syndromes. We also discussed genetic testing, including the appropriate family members to test, the process of testing, insurance coverage and turn-around-time for results. We discussed the implications of a negative, positive and/or variant of uncertain significant result. We recommended Mr. Owens pursue genetic testing for the [facility] CustomNext+RNA gene panel.   Based on George Owens personal history of cancer, he meets medical criteria for genetic testing. Despite that he meets criteria, he may still have an out of pocket cost. We discussed that if his out of pocket cost for testing is over $100, the laboratory will call and confirm whether he wants to proceed with testing.  If the out of pocket cost of testing is less than $100 he will be billed by the genetic testing laboratory.   PLAN: After considering the risks, benefits, and  limitations, George Owens provided informed consent to pursue genetic testing and the blood sample was sent to [facility] for analysis of the CustomNext+RNA panel. Results should be available within approximately 2-3 weeks' time, at which point they will be disclosed by telephone to George Owens, as will any additional recommendations warranted by these results. George Owens will receive a summary of his genetic counseling visit and a copy of his results once available. This information will also be available in Epic.   George Owens questions were answered to his satisfaction today. Our contact information was provided should additional questions or concerns arise. Thank you for the referral and allowing [place] to share in the care of your patient.   Faith Rogue, MS, [facility] Genetic Counselor [email or web address] Phone: [phone]  The patient was seen for a total of 25 minutes in face-to-face genetic counseling.  Patient brought his wife, [name]. Dr. Grayland Ormond was available for discussion  regarding this case.   _______________________________________________________________________ For Office Staff:  Number of people involved in session: 2 Was an Intern/ student involved with case: no

## 2022-04-22 LAB — CANCER ANTIGEN 19-9: CA 19-9: 10 U/mL (ref 0–35)

## 2022-04-25 ENCOUNTER — Other Ambulatory Visit: Payer: Self-pay | Admitting: Gastroenterology

## 2022-05-03 ENCOUNTER — Ambulatory Visit: Payer: Self-pay | Admitting: Licensed Clinical Social Worker

## 2022-05-03 ENCOUNTER — Telehealth: Payer: Self-pay | Admitting: Licensed Clinical Social Worker

## 2022-05-03 ENCOUNTER — Encounter: Payer: Self-pay | Admitting: Licensed Clinical Social Worker

## 2022-05-03 DIAGNOSIS — Z1379 Encounter for other screening for genetic and chromosomal anomalies: Secondary | ICD-10-CM

## 2022-05-03 NOTE — Progress Notes (Signed)
HPI:   Mr. Britten was previously seen in the [place] clinic due to a personal and family history of cancer and concerns regarding a hereditary predisposition to cancer. Please refer to our prior cancer genetics clinic note for more information regarding our discussion, assessment and recommendations, at the time. Mr. Fiallos recent genetic test results were disclosed to him, as were recommendations warranted by these results. These results and recommendations are discussed in more detail below.  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 No pathogenic variants identified on the [facility] CancerNext+RNA panel. The report date is 05/02/2022.  The CancerNext+RNAinsight gene panel offered by [facility] includes sequencing and rearrangement analysis for the following 36 genes: APC*, ATM*, AXIN2, BARD1, BMPR1A, BRCA1*, BRCA2*, BRIP1*, CDH1*, CDK4, CDKN2A, CHEK2*, DICER1, MLH1*, MSH2*, MSH3, MSH6*, MUTYH*, NBN, NF1*, NTHL1, PALB2*, PMS2*, PTEN*, RAD51C*, RAD51D*, RECQL, SMAD4, SMARCA4, STK11 and TP53* (sequencing and deletion/duplication); HOXB13, POLD1 and POLE (sequencing only); EPCAM and GREM1 (deletion/duplication only).     FAMILY HISTORY:  We obtained a detailed, 4-generation family history.  Significant diagnoses are listed below: Family History  Problem Relation Age of Onset   Stroke Mother    Arthritis Father    Heart disease Father    Brain cancer Brother        1/2 brother (possibly  related to mother NOT related to patient)   Cancer Cousin        unk type   Lymphoma Cousin        vs leukemia   Colon cancer Neg Hx    Esophageal cancer Neg Hx    Pancreatic cancer Neg Hx    Stomach cancer Neg Hx    Liver disease Neg Hx       GENETIC TEST RESULTS:  The [facility] CancerNext+RNA Panel found no pathogenic mutations.   The CancerNext+RNAinsight gene panel offered by [facility] includes sequencing and rearrangement analysis for the following 36 genes: APC*, ATM*, AXIN2, BARD1, BMPR1A, BRCA1*, BRCA2*, BRIP1*, CDH1*, CDK4, CDKN2A, CHEK2*, DICER1, MLH1*, MSH2*, MSH3, MSH6*, MUTYH*, NBN, NF1*, NTHL1, PALB2*, PMS2*, PTEN*, RAD51C*, RAD51D*, RECQL, SMAD4, SMARCA4, STK11 and TP53* (sequencing and deletion/duplication); HOXB13, POLD1 and POLE (sequencing only); EPCAM and GREM1 (deletion/duplication only).   The test report has been scanned into EPIC and is located under the Molecular Pathology section of the Results Review tab.  A portion of the result report is included below for reference. Genetic testing reported out on 05/02/2022.     Even though a pathogenic variant was not identified, possible explanations for the cancer in the family may include: There may be no hereditary risk for cancer in the family. The cancers in Mr. Miralles and/or his family may be sporadic/familial or due to other genetic and environmental factors. There may be a gene mutation in one of these genes that current testing methods cannot detect but that chance is small. There could be another gene that has not yet been discovered, or that we have not yet tested, that  is responsible for the cancer diagnoses in the family.  It is also possible there is a hereditary cause for the cancer in the family that Mr. Dahan did not inherit.   Therefore, it is important to remain in touch with cancer genetics in the future so that we can continue to offer Mr. Weakland the most up to date genetic testing.    ADDITIONAL GENETIC TESTING:  We discussed with Mr. Ybarra that his genetic testing was fairly extensive.  If there are additional relevant genes identified to increase cancer risk that can be analyzed in the future, we would be happy to discuss and coordinate this testing at that time.    CANCER SCREENING RECOMMENDATIONS:  Mr. Summer test result is considered negative (normal).  This means that we have not identified a hereditary cause for his personal and family history of cancer at this time.  An individual's cancer risk and medical management are not determined by genetic test results alone. Overall cancer risk assessment incorporates additional factors, including personal medical history, family history, and any available genetic information that may result in a personalized plan for cancer prevention and surveillance. Therefore, it is recommended he continue to follow the cancer management and screening guidelines provided by his oncology and primary healthcare provider.  RECOMMENDATIONS FOR FAMILY MEMBERS:   Since he did not inherit a identifiable mutation in a cancer predisposition gene included on this panel, his children could not have inherited a known mutation from him in one of these genes. Individuals in this family might be at some increased risk of developing cancer, over the general population risk, due to the family history of cancer.  Individuals in the family should notify their providers of the family history of cancer. We recommend women in this family have a yearly mammogram beginning at age 57, or 54 years younger than the earliest onset of cancer, an annual clinical breast exam, and perform monthly breast self-exams.  Family members should have colonoscopies by at age 68, or earlier, as recommended by their providers.  FOLLOW-UP:  Lastly, we discussed with Mr. Koskinen that cancer genetics is a rapidly advancing field and it is possible that new genetic tests will be  appropriate for him and/or his family members in the future. We encouraged him to remain in contact with cancer genetics on an annual basis so we can update his personal and family histories and let him know of advances in cancer genetics that may benefit this family.   Our contact number was provided. Mr. Rock questions were answered to his satisfaction, and he knows he is welcome to call us at anytime with additional questions or concerns.    Faith Rogue, MS, [facility] Genetic Counselor [email or web address] Phone: [phone]

## 2022-05-03 NOTE — Telephone Encounter (Signed)
I contacted George Owens to discuss his genetic testing results. No pathogenic variants were identified in the 36 genes analyzed. Detailed clinic note to follow.   The test report has been scanned into EPIC and is located under the Molecular Pathology section of the Results Review tab.  A portion of the result report is included below for reference.      Faith Rogue, MS, [facility] Genetic Counselor [email or web address] Phone: [phone]

## 2022-05-04 ENCOUNTER — Other Ambulatory Visit: Payer: Self-pay | Admitting: Oncology

## 2022-05-04 ENCOUNTER — Ambulatory Visit
Admission: RE | Admit: 2022-05-04 | Discharge: 2022-05-04 | Disposition: A | Discharge status: Home / Self Care | Payer: PPO | Source: Ambulatory Visit | Attending: Internal Medicine | Admitting: Internal Medicine

## 2022-05-04 ENCOUNTER — Telehealth: Payer: Self-pay | Admitting: *Deleted

## 2022-05-04 ENCOUNTER — Telehealth: Payer: Self-pay | Admitting: Internal Medicine

## 2022-05-04 DIAGNOSIS — M818 Other osteoporosis without current pathological fracture: Secondary | ICD-10-CM | POA: Diagnosis not present

## 2022-05-04 DIAGNOSIS — M810 Age-related osteoporosis without current pathological fracture: Secondary | ICD-10-CM | POA: Diagnosis not present

## 2022-05-04 NOTE — Telephone Encounter (Signed)
Pt called wanting to know if the provider recommends the RSV for him and his spouse

## 2022-05-04 NOTE — Telephone Encounter (Signed)
Pt called about elbow pain off and on for a month. Sometimes it has pains to all the way to his fingers onleft side. He says there is no bruising and he did not hurt it. He wants to make sure that it could related to the pancreatic cancer. I check with Janese Banks and it does not relate to cancer. If it does not get better he could see PCP and the pt has a good friend that his PA and his wife can get him in to see PA. Also wife I spoke to when I called back to the home and she said he should try heating pad and that is ok also

## 2022-05-05 NOTE — Telephone Encounter (Signed)
LMTCB

## 2022-05-06 NOTE — Telephone Encounter (Signed)
Spoke with pt to let him know that Dr. Derrel Nip does recommend his to get the vaccine but not his wife. Pt and wife both gave a verbal understanding.

## 2022-05-06 NOTE — Telephone Encounter (Signed)
Pt returning call

## 2022-05-08 ENCOUNTER — Encounter: Payer: Self-pay | Admitting: Internal Medicine

## 2022-05-11 NOTE — Telephone Encounter (Signed)
They said Dispense as written is not checked and we need to check that box and fax it back. Did this and fax it to [phone]

## 2022-05-18 NOTE — Telephone Encounter (Signed)
Informed patient; verbalized understanding.

## 2022-05-18 NOTE — Telephone Encounter (Signed)
Called to check on status of the patient patient assistance for the [employer] and they are missing the patient portion of the form. Will call patient and get patient to come and fill out his portion.

## 2022-05-18 NOTE — Telephone Encounter (Signed)
Called patient to inform patient that we needed patient portion of the form filled out. He states he does not know if he should still be taking the medication because he only takes it now if he eats a heavy meal. She states most days do not even take the medication. He states he still half of one bottle of the first shipment of medication they sent. She states that his oncologist has found a couple of nodules on his lungs. She thinks they are cancer but is going to do a PET scan in 2 weeks. He wants to know should he keep taking the medication or just wait to see if he began to have GI issues again

## 2022-05-18 NOTE — Telephone Encounter (Signed)
I am okay to hold off on the patient assistance as long as patient does not have any GI symptoms including weight loss and diarrhea  RV

## 2022-05-23 ENCOUNTER — Telehealth: Payer: Self-pay | Admitting: *Deleted

## 2022-05-23 NOTE — Telephone Encounter (Signed)
Pt called and having christmas on 12/16 and wanted results before then so entire family knows about where he stands with cancer. Appt changed to 12/15 10:45 for labs and 11 am to see Janese Banks . Pt great with this appt.

## 2022-05-31 ENCOUNTER — Ambulatory Visit
Admission: RE | Admit: 2022-05-31 | Discharge: 2022-05-31 | Disposition: A | Discharge status: Home / Self Care | Payer: PPO | Source: Ambulatory Visit | Attending: Oncology | Admitting: Oncology

## 2022-05-31 DIAGNOSIS — E041 Nontoxic single thyroid nodule: Secondary | ICD-10-CM | POA: Diagnosis not present

## 2022-05-31 DIAGNOSIS — I722 Aneurysm of renal artery: Secondary | ICD-10-CM | POA: Diagnosis not present

## 2022-05-31 DIAGNOSIS — Z90411 Acquired partial absence of pancreas: Secondary | ICD-10-CM | POA: Diagnosis not present

## 2022-05-31 DIAGNOSIS — R918 Other nonspecific abnormal finding of lung field: Secondary | ICD-10-CM | POA: Diagnosis not present

## 2022-05-31 DIAGNOSIS — Z8546 Personal history of malignant neoplasm of prostate: Secondary | ICD-10-CM | POA: Diagnosis not present

## 2022-05-31 DIAGNOSIS — C251 Malignant neoplasm of body of pancreas: Secondary | ICD-10-CM

## 2022-05-31 DIAGNOSIS — C259 Malignant neoplasm of pancreas, unspecified: Secondary | ICD-10-CM | POA: Insufficient documentation

## 2022-05-31 DIAGNOSIS — Z9081 Acquired absence of spleen: Secondary | ICD-10-CM | POA: Insufficient documentation

## 2022-05-31 LAB — GLUCOSE, CAPILLARY: Glucose-Capillary: 106 mg/dL — ABNORMAL HIGH (ref 70–99)

## 2022-05-31 MED ORDER — FLUDEOXYGLUCOSE F - 18 (FDG) INJECTION
9.5000 | Freq: Once | INTRAVENOUS | Status: AC
  Administered 2022-05-31: 10.24 via INTRAVENOUS

## 2022-06-01 NOTE — Telephone Encounter (Signed)
MyChart messgae sent to patient. 

## 2022-06-03 ENCOUNTER — Other Ambulatory Visit: Payer: PPO

## 2022-06-03 ENCOUNTER — Ambulatory Visit: Payer: PPO | Admitting: Oncology

## 2022-06-08 ENCOUNTER — Ambulatory Visit: Payer: PPO | Admitting: Oncology

## 2022-06-08 ENCOUNTER — Encounter: Payer: Self-pay | Admitting: Oncology

## 2022-06-08 ENCOUNTER — Inpatient Hospital Stay: Payer: PPO | Attending: Oncology

## 2022-06-08 ENCOUNTER — Other Ambulatory Visit: Payer: PPO

## 2022-06-08 ENCOUNTER — Inpatient Hospital Stay (HOSPITAL_BASED_OUTPATIENT_CLINIC_OR_DEPARTMENT_OTHER): Payer: PPO | Admitting: Oncology

## 2022-06-08 VITALS — BP 113/67 | HR 73 | Temp 97.1°F | Resp 16 | Ht 75.0 in | Wt 181.0 lb

## 2022-06-08 DIAGNOSIS — Z452 Encounter for adjustment and management of vascular access device: Secondary | ICD-10-CM | POA: Diagnosis not present

## 2022-06-08 DIAGNOSIS — Z08 Encounter for follow-up examination after completed treatment for malignant neoplasm: Secondary | ICD-10-CM

## 2022-06-08 DIAGNOSIS — C251 Malignant neoplasm of body of pancreas: Secondary | ICD-10-CM

## 2022-06-08 DIAGNOSIS — R918 Other nonspecific abnormal finding of lung field: Secondary | ICD-10-CM

## 2022-06-08 DIAGNOSIS — Z8507 Personal history of malignant neoplasm of pancreas: Secondary | ICD-10-CM

## 2022-06-08 LAB — CBC WITH DIFFERENTIAL/PLATELET
Abs Immature Granulocytes: 0.02 10*3/uL (ref 0.00–0.07)
Basophils Absolute: 0 10*3/uL (ref 0.0–0.1)
Basophils Relative: 1 %
Eosinophils Absolute: 0.1 10*3/uL (ref 0.0–0.5)
Eosinophils Relative: 1 %
HCT: 39.5 % (ref 39.0–52.0)
Hemoglobin: 13.6 g/dL (ref 13.0–17.0)
Immature Granulocytes: 0 %
Lymphocytes Relative: 39 %
Lymphs Abs: 2.7 10*3/uL (ref 0.7–4.0)
MCH: 31.2 pg (ref 26.0–34.0)
MCHC: 34.4 g/dL (ref 30.0–36.0)
MCV: 90.6 fL (ref 80.0–100.0)
Monocytes Absolute: 0.9 10*3/uL (ref 0.1–1.0)
Monocytes Relative: 13 %
Neutro Abs: 3.2 10*3/uL (ref 1.7–7.7)
Neutrophils Relative %: 46 %
Platelets: 266 10*3/uL (ref 150–400)
RBC: 4.36 MIL/uL (ref 4.22–5.81)
RDW: 14.1 % (ref 11.5–15.5)
WBC: 6.9 10*3/uL (ref 4.0–10.5)
nRBC: 0 % (ref 0.0–0.2)

## 2022-06-08 LAB — COMPREHENSIVE METABOLIC PANEL
ALT: 29 U/L (ref 0–44)
AST: 31 U/L (ref 15–41)
Albumin: 3.9 g/dL (ref 3.5–5.0)
Alkaline Phosphatase: 48 U/L (ref 38–126)
Anion gap: 8 (ref 5–15)
BUN: 24 mg/dL — ABNORMAL HIGH (ref 8–23)
CO2: 27 mmol/L (ref 22–32)
Calcium: 9.1 mg/dL (ref 8.9–10.3)
Chloride: 102 mmol/L (ref 98–111)
Creatinine, Ser: 0.85 mg/dL (ref 0.61–1.24)
GFR, Estimated: >60 mL/min (ref >60)
Glucose, Bld: 154 mg/dL — ABNORMAL HIGH (ref 70–99)
Potassium: 3.9 mmol/L (ref 3.5–5.1)
Sodium: 137 mmol/L (ref 135–145)
Total Bilirubin: 0.6 mg/dL (ref 0.3–1.2)
Total Protein: 7.1 g/dL (ref 6.5–8.1)

## 2022-06-08 MED ORDER — SODIUM CHLORIDE 0.9% FLUSH
10.0000 mL | Freq: Once | INTRAVENOUS | Status: AC
  Administered 2022-06-08: 10 mL via INTRAVENOUS
  Filled 2022-06-08: qty 10

## 2022-06-08 MED ORDER — HEPARIN SOD (PORK) LOCK FLUSH 100 UNIT/ML IV SOLN
500.0000 [IU] | Freq: Once | INTRAVENOUS | Status: AC
  Administered 2022-06-08: 500 [IU] via INTRAVENOUS
  Filled 2022-06-08: qty 5

## 2022-06-09 LAB — CANCER ANTIGEN 19-9: CA 19-9: 11 U/mL (ref 0–35)

## 2022-06-09 NOTE — Progress Notes (Signed)
Hematology/Oncology Consult note [facility]  [phone] [phone]  Patient Care Team: Crecencio Mc, MD as PCP - General (Internal Medicine) Clent Jacks, RN as Oncology Nurse Navigator   Name of the patient: George Owens  [id number]  09-Apr-1932   Date of visit: 06/09/22  Diagnosis- stage Ib pancreatic cancer s/p resection and chemotherapy   Chief complaint/ Reason for visit-discuss CT scan results and further management  Heme/Onc history: patient is a 88 year old male with a past medical history significant for GERD, hyperlipidemia, cirrhosis.  He was having symptoms of Abdominal pain and reflux and therefore underwent a CT abdomen and pelvis with contrast on 09/11/2020 which showed a hypointense ill-defined mass in the pancreatic body up to 25 mm.  No extrapancreatic infiltrative density about the aorta or proximal [name].  No local regional adenopathy or distant metastatic disease.  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  This was followed by an EUS at [facility] which showed a hypoechoic mass measuring 1.8 x 1.6 cm in the pancreatic body.  Endosonographic borders poorly defined.  Upstream pancreatic duct dilatation.  No abnormal appearing lymph nodes.  Endosonographic imaging of the liver showed no lesion.  Biopsy showed adenocarcinoma.   PET scan showed 2.7 x 2 cm ovoid mass in the body of the pancreas with an SUV of 7.1.  18 mm hypermetabolic nodule in the isthmus of the thyroid gland.  No evidence of local regional adenopathy or distant metastatic disease.   Patient was evaluated by Dr. Hyman Hopes at [facility] for consideration of surgery and has been deemed to be upfront resectable surgical candidate.  However plan was to offer him neoadjuvant chemotherapy for 3 months to control micrometastatic disease before proceeding with definitive surgery.  If ultimately patient decides not to proceed with surgery then radiation at [facility] also remains an  option.   Patient received 8 doses of gemcitabine and Abraxane chemotherapy 1 week on 1 week off due to neutropenia and thrombocytopenia. Patient underwent laparoscopy surgery by Dr. Hyman Hopes at [facility].  Final pathology showed 3 cm grade 3 poorly differentiated adenocarcinoma of the pancreas with negative margins.  15 lymph nodes negative for malignancy.  Absent chemotherapy response with extensive residual cancer and no evidence of tumor regression.  Perineural invasion identified lymphovascular invasion not identified.  Patient completed 4 months of adjuvant gemcitabine Abraxane chemotherapy in January 2023.   Interval history-patient is doing well for his age.  Denies any specific complaints at this time.  ECOG PS- 1 Pain scale- 0   Review of systems- Review of Systems  Constitutional:  Negative for chills, fever, malaise/fatigue and weight loss.  HENT:  Negative for congestion, ear discharge and nosebleeds.   Eyes:  Negative for blurred vision.  Respiratory:  Negative for cough, hemoptysis, sputum production, shortness of breath and wheezing.   Cardiovascular:  Negative for chest pain, palpitations, orthopnea and claudication.  Gastrointestinal:  Negative for abdominal pain, blood in stool, constipation, diarrhea, heartburn, melena, nausea and vomiting.  Genitourinary:  Negative for dysuria, flank pain, frequency, hematuria and urgency.  Musculoskeletal:  Negative for back pain, joint pain and myalgias.  Skin:  Negative for rash.  Neurological:  Negative for dizziness, tingling, focal weakness, seizures, weakness and headaches.  Endo/Heme/Allergies:  Does not bruise/bleed easily.  Psychiatric/Behavioral:  Negative for depression and suicidal ideas. The patient does not have insomnia.       Allergies  Allergen Reactions   Kenalog [Triamcinolone Acetonide]     Blindness X  3 Days injection     Past Medical History:  Diagnosis Date   Allergy    Aneurysm of abdominal aorta (HCC)     Basal cell carcinoma    right temple Dr. 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     Past Surgical History:  Procedure Laterality Date   CATARACT EXTRACTION Right    CATARACT EXTRACTION W/PHACO Left 12/26/2017   Procedure: CATARACT EXTRACTION PHACO AND INTRAOCULAR LENS PLACEMENT ([place])  COMPLICATED LEFT;  Surgeon: Leandrew Koyanagi, MD;  Location: [place];  Service: Ophthalmology;  Laterality: Left;  [place]   colonoscopy     ESOPHAGOGASTRODUODENOSCOPY (EGD) WITH PROPOFOL     EYE MUSCLE SURGERY Left    IR KYPHO THORACIC WITH BONE BIOPSY  11/11/2021   PORTA CATH INSERTION N/A 10/19/2020   Procedure: PORTA CATH INSERTION;  Surgeon: Algernon Huxley, MD;  Location: [place] [facility];  Service: Cardiovascular;  Laterality: N/A;   TONSILLECTOMY AND ADENOIDECTOMY      Social History   Socioeconomic History   Marital status: Married    Spouse name: Eloise   Number of children: 4   Years of education: Not on file   Highest education level: Not on file  Occupational History   Occupation: retired  Tobacco Use   Smoking status: Never   Smokeless tobacco: Never  Vaping Use   Vaping Use: Never used  Substance and Sexual Activity   Alcohol use: No   Drug use: No   Sexual activity: Yes    Partners: Female  Other Topics Concern   Not on file  Social History Narrative   Lives with wife in a 5 story retirement home.  He lives on the first floor.  4 year college degree.  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     Social Determinants of Health   Financial Resource Strain: Low Risk  (04/13/2020)   Overall Financial Resource Strain (CARDIA)    Difficulty of  Paying Living Expenses: Not hard at all  Food Insecurity: No Food Insecurity (04/10/2019)   Hunger Vital Sign    Worried About Running Out of Food in the Last Year: Never true    Ran Out of Food in the Last Year: Never true  Transportation Needs: No Transportation Needs (04/13/2020)   PRAPARE - Hydrologist (Medical): No    Lack of Transportation (Non-Medical): No  Physical Activity: Sufficiently Active (04/10/2019)   Exercise Vital Sign    Days of Exercise per Week: 5 days    Minutes of Exercise per Session: 30 min  Stress: No Stress Concern Present (04/13/2020)   [place]    Feeling of Stress : Not at all  Social Connections: Unknown (04/13/2020)   Social Connection and Isolation Panel [NHANES]    Frequency of Communication with Friends and Family: Not on file    Frequency of Social Gatherings with Friends and Family: Not on file    Attends Religious Services: Not on file    Active Member of Clubs or Organizations: Not on file  Attends Archivist Meetings: Not on file    Marital Status: Married  Intimate Partner Violence: Not At Risk (04/10/2019)   Humiliation, Afraid, Rape, and Kick questionnaire    Fear of Current or Ex-Partner: No    Emotionally Abused: No    Physically Abused: No    Sexually Abused: No    Family History  Problem Relation Age of Onset   Stroke Mother    Arthritis Father    Heart disease Father    Brain cancer Brother        1/2 brother (possibly related to mother NOT related to patient)   Cancer Cousin        unk type   Lymphoma Cousin        vs leukemia   Colon cancer Neg Hx    Esophageal cancer Neg Hx    Pancreatic cancer Neg Hx    Stomach cancer Neg Hx    Liver disease Neg Hx      Current Outpatient Medications:    acetaminophen (TYLENOL) 500 MG tablet, Take 1,000 mg by mouth every 6 (six) hours as needed for moderate pain., Disp: ,  Rfl:    Calcium Carb-Cholecalciferol (CALCIUM 600 + D PO), Take 1 tablet by mouth daily., Disp: , Rfl:    cholecalciferol (VITAMIN D3) 25 MCG (1000 UNIT) tablet, Take 1,000 Units by mouth daily., Disp: , Rfl:    Cranberry 500 MG CAPS, Take 500 mg by mouth daily., Disp: , Rfl:    CREON 36000-114000 units CPEP capsule, Take 36,000 Units by mouth See admin instructions. Take 36000 units once daily with the largest meal of the day, Disp: , Rfl:    Denosumab (PROLIA [place]), Inject 1 Dose into the skin every 6 (six) months., Disp: , Rfl:    hydrocortisone (ANUSOL-HC) 2.5 % rectal cream, Place 1 application rectally 2 (two) times daily., Disp: 30 g, Rfl: 0   loratadine (CLARITIN) 10 MG tablet, Take 10 mg by mouth daily as needed for allergies., Disp: , Rfl:    metoprolol succinate (TOPROL-XL) 50 MG 24 hr tablet, Take 1.5 tablets (75 mg total) by mouth daily., Disp: 30 tablet, Rfl: 1   omeprazole (PRILOSEC) 40 MG capsule, TAKE 1 CAPSULE (40 MG TOTAL) BY MOUTH DAILY., Disp: 90 capsule, Rfl: 3   Polyethyl Glycol-Propyl Glycol (SYSTANE OP), Place 1 drop into both eyes daily., Disp: , Rfl:    Ranibizumab (LUCENTIS IO), Place 1 Dose into the right eye See admin instructions. Every 14 weeks, Disp: , Rfl:    tamsulosin (FLOMAX) 0.4 MG CAPS capsule, TAKE 1 CAPSULE BY MOUTH EVERY DAY AFTER SUPPER, Disp: 90 capsule, Rfl: 1   vitamin B-12 (CYANOCOBALAMIN) 1000 MCG tablet, Take 1,000 mcg by mouth daily., Disp: , Rfl:    lidocaine-prilocaine (EMLA) cream, Apply to affected area once (Patient not taking: Reported on 02/08/2022), Disp: 30 g, Rfl: 3   temazepam (RESTORIL) 30 MG capsule, TAKE 1 CAPSULE BY MOUTH AT BEDTIME AS NEEDED FOR SLEEP (Patient not taking: Reported on 03/07/2022), Disp: 30 capsule, Rfl: 3 No current facility-administered medications for this visit.  Facility-Administered Medications Ordered in Other Visits:    heparin lock flush 100 UNIT/ML injection, , , ,    heparin lock flush 100 unit/mL, 500 Units,  Intravenous, Once, Sindy Guadeloupe, MD  Physical exam:  Vitals:   06/08/22 1116  BP: 113/67  Pulse: 73  Resp: 16  Temp: (!) 97.1 F (36.2 C)  TempSrc: Tympanic  SpO2: 98%  Weight: 181  lb (82.1 kg)  Height: '6\' 3"'$  (1.905 m)   Physical Exam Cardiovascular:     Rate and Rhythm: Normal rate and regular rhythm.     Heart sounds: Normal heart sounds.  Pulmonary:     Effort: Pulmonary effort is normal.     Breath sounds: Normal breath sounds.  Abdominal:     General: Bowel sounds are normal.     Palpations: Abdomen is soft.  Skin:    General: Skin is warm and dry.  Neurological:     Mental Status: He is alert and oriented to person, place, and time.         Latest Ref Rng & Units 06/08/2022   10:52 AM  CMP  Glucose 70 - 99 mg/dL 154   BUN 8 - 23 mg/dL 24   Creatinine 0.61 - 1.24 mg/dL 0.85   Sodium 135 - 145 mmol/L 137   Potassium 3.5 - 5.1 mmol/L 3.9   Chloride 98 - 111 mmol/L 102   CO2 22 - 32 mmol/L 27   Calcium 8.9 - 10.3 mg/dL 9.1   Total Protein 6.5 - 8.1 g/dL 7.1   Total Bilirubin 0.3 - 1.2 mg/dL 0.6   Alkaline Phos 38 - 126 U/L 48   AST 15 - 41 U/L 31   ALT 0 - 44 U/L 29       Latest Ref Rng & Units 06/08/2022   10:52 AM  CBC  WBC 4.0 - 10.5 K/uL 6.9   Hemoglobin 13.0 - 17.0 g/dL 13.6   Hematocrit 39.0 - 52.0 % 39.5   Platelets 150 - 400 K/uL 266     No images are attached to the encounter.  NM PET Image Restag (PS) Skull Base To Thigh  Result Date: 06/01/2022 CLINICAL DATA:  Subsequent treatment strategy for pancreatic cancer. History of prostate cancer also. EXAM: NUCLEAR MEDICINE PET SKULL BASE TO THIGH TECHNIQUE: 10.24 mCi F-18 FDG was injected intravenously. Full-ring PET imaging was performed from the skull base to thigh after the radiotracer. CT data was obtained and used for attenuation correction and anatomic localization. Fasting blood glucose: 106 mg/dl COMPARISON:  PET-CT 09/29/2020.  Most recent CT scan is 02/25/2022 FINDINGS: Mediastinal  blood pool activity: SUV max 1.86 Liver activity: SUV max NA NECK: Persistent hypermetabolic 2 cm nodule in the isthmic region of the thyroid gland. SUV max is 4.11 and was previously 4.59. Recommend thyroid ultrasound and consideration for biopsy if not already. No hypermetabolic lymphadenopathy in the neck. Incidental CT findings: None. CHEST: No hypermetabolic mediastinal or hilar adenopathy. No supraclavicular or axillary adenopathy. No hypermetabolic pulmonary lesions. Stable bilateral pulmonary nodules. No new pulmonary nodules. Incidental CT findings: Stable emphysematous changes and pulmonary scarring. No acute overlying pulmonary process. Stable advanced vascular disease. ABDOMEN/PELVIS: Status post partial pancreatectomy and splenectomy. No PET-CT findings for residual or recurrent tumor. No metastatic disease involving the abdomen/pelvis. Incidental CT findings: Stable gallstones. 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 Stable vascular disease. Brachytherapy sees or biopsy clips the prostate gland. SKELETON: No hypermetabolic bone lesions to suggest active metastatic disease. Incidental CT findings: Stable scattered sclerotic bone lesions. IMPRESSION: 1. Status post partial pancreatectomy and splenectomy. No findings for residual or recurrent tumor. 2. No findings for metastatic disease involving the abdomen/pelvis. 3. Stable bilateral pulmonary nodules. 4. Stable scattered sclerotic bone lesions but no hypermetabolic bone disease. 5. Persistent hypermetabolic isthmic thyroid nodule. Recommend thyroid ultrasound and consideration for biopsy if not already performed (and if clinically necessary given patient's age and  comorbidities). Electronically Signed   By: Marijo Sanes M.D.   On: 06/01/2022 08:34     Assessment and plan- Patient is a 88 y.o. male with history of stage IB pancreatic cancer s/p neoadjuvant chemotherapy and surgery.  He is currently in remission and this is a routine follow-up  visit  I have reviewed PET CT scanImages independently and discussed findings with the patient which does not show any further growth in the size of his lung nodules which are bilateral ranging from 5 to 8 mm.  This was compared to his prior CT in September 2023.  They are still fairly small and not amenable to biopsy and therefore it is unclear if this is nonspecific versus related to malignancy.  However given that they have remained stable is reassuring at this time.  CA 19-9 continues to be normal.  There is no other evidence of distant metastatic disease.  Patient was also found to have hypermetabolic 2 cm nodule in the isthmus of the thyroid gland which was also noted back on his PET scan a year ago and measured 18 mm.  We discussed continued watchful waiting versus pursuing ultrasound and biopsy.  Patient would like to think about it and discuss this with Dr. Derrel Nip at her next visit.  I will see him back in 4 months with repeat scans CBC with differential CMP and CA 19-9   Visit Diagnosis 1. Encounter for follow-up surveillance of pancreatic cancer   2. Lung nodules      Dr. Randa Evens, MD, MPH [facility] at [facility] [phone] 06/09/2022 10:27 AM

## 2022-07-26 ENCOUNTER — Encounter: Payer: Self-pay | Admitting: Internal Medicine

## 2022-07-26 ENCOUNTER — Ambulatory Visit (INDEPENDENT_AMBULATORY_CARE_PROVIDER_SITE_OTHER): Payer: PPO | Admitting: Internal Medicine

## 2022-07-26 VITALS — BP 116/66 | HR 61 | Temp 97.7°F | Ht 75.0 in | Wt 183.2 lb

## 2022-07-26 DIAGNOSIS — C251 Malignant neoplasm of body of pancreas: Secondary | ICD-10-CM

## 2022-07-26 DIAGNOSIS — R7301 Impaired fasting glucose: Secondary | ICD-10-CM | POA: Diagnosis not present

## 2022-07-26 DIAGNOSIS — G8929 Other chronic pain: Secondary | ICD-10-CM

## 2022-07-26 DIAGNOSIS — F418 Other specified anxiety disorders: Secondary | ICD-10-CM | POA: Diagnosis not present

## 2022-07-26 DIAGNOSIS — E041 Nontoxic single thyroid nodule: Secondary | ICD-10-CM

## 2022-07-26 DIAGNOSIS — E7800 Pure hypercholesterolemia, unspecified: Secondary | ICD-10-CM | POA: Diagnosis not present

## 2022-07-26 DIAGNOSIS — M25551 Pain in right hip: Secondary | ICD-10-CM

## 2022-07-26 NOTE — Assessment & Plan Note (Signed)
Secondary to history of pancreatic cancer,  He is managing his anxiety with meditation but requiring alprazolam at night

## 2022-07-26 NOTE — Assessment & Plan Note (Signed)
Referring to PT at [facility] for pain brought on by inactivity and improved with walking . Reassurance given

## 2022-07-26 NOTE — Progress Notes (Signed)
Subjective:  Patient ID: George Owens, male    DOB: 1931/11/09  Age: 89 y.o. MRN: [id number]  CC: The primary encounter diagnosis was Malignant neoplasm of body of pancreas ([place]). Diagnoses of Thyroid nodule, Impaired fasting glucose, Pure hypercholesterolemia, Chronic right hip pain, and Anxiety about health were also pertinent to this visit.   HPI George Owens presents for  Chief Complaint  Patient presents with   Muscle Pain   Last seen in September.   Recurrence of Right hip pain which has been persistent for the past week or more (more per wife).  Flares up ,  uses heating pad and had PT at [facility] which included bridging exercises and leg raises .  No pain after walking 2 miles .  No history of falls.  Plain x rays were normal in 2018   Pulmonary nodules have not changed by repeat imaging   maintaining weight.  Bow[place] moving regularly ,  stomach feels heavy when he eats a full meal .  Has occasional diarrhea if he eats lettuce ("it goes right through me)   Outpatient Medications Prior to Visit  Medication Sig Dispense Refill   acetaminophen (TYLENOL) 500 MG tablet Take 1,000 mg by mouth every 6 (six) hours as needed for moderate pain.     Calcium Carb-Cholecalciferol (CALCIUM 600 + D PO) Take 1 tablet by mouth daily.     cholecalciferol (VITAMIN D3) 25 MCG (1000 UNIT) tablet Take 1,000 Units by mouth daily.     Cranberry 500 MG CAPS Take 500 mg by mouth daily.     Denosumab (PROLIA [place]) Inject 1 Dose into the skin every 6 (six) months.     hydrocortisone (ANUSOL-HC) 2.5 % rectal cream Place 1 application rectally 2 (two) times daily. 30 g 0   loratadine (CLARITIN) 10 MG tablet Take 10 mg by mouth daily as needed for allergies.     metoprolol succinate (TOPROL-XL) 50 MG 24 hr tablet Take 1.5 tablets (75 mg total) by mouth daily. 30 tablet 1   omeprazole (PRILOSEC) 40 MG capsule TAKE 1 CAPSULE (40 MG TOTAL) BY MOUTH DAILY. 90 capsule 3   Polyethyl Glycol-Propyl Glycol (SYSTANE  OP) Place 1 drop into both eyes daily.     Ranibizumab (LUCENTIS IO) Place 1 Dose into the right eye See admin instructions. Every 14 weeks     tamsulosin (FLOMAX) 0.4 MG CAPS capsule TAKE 1 CAPSULE BY MOUTH EVERY DAY AFTER SUPPER 90 capsule 1   temazepam (RESTORIL) 30 MG capsule TAKE 1 CAPSULE BY MOUTH AT BEDTIME AS NEEDED FOR SLEEP 30 capsule 3   vitamin B-12 (CYANOCOBALAMIN) 1000 MCG tablet Take 1,000 mcg by mouth daily.     CREON 36000-114000 units CPEP capsule Take 36,000 Units by mouth See admin instructions. Take 36000 units once daily with the largest meal of the day (Patient not taking: Reported on 07/26/2022)     lidocaine-prilocaine (EMLA) cream Apply to affected area once (Patient not taking: Reported on 02/08/2022) 30 g 3   Facility-Administered Medications Prior to Visit  Medication Dose Route Frequency Provider Last Rate Last Admin   heparin lock flush 100 UNIT/ML injection            heparin lock flush 100 unit/mL  500 Units Intravenous Once Sindy Guadeloupe, MD        Review of Systems;  Patient denies headache, fevers, malaise, unintentional weight loss, skin rash, eye pain, sinus congestion and sinus pain, sore throat, dysphagia,  hemoptysis , cough,  dyspnea, wheezing, chest pain, palpitations, orthopnea, edema, abdominal pain, nausea, melena, diarrhea, constipation, flank pain, dysuria, hematuria, urinary  Frequency, nocturia, numbness, tingling, seizures,  Focal weakness, Loss of consciousness,  Tremor, insomnia, depression, anxiety, and suicidal ideation.      Objective:  BP 116/66   Pulse 61   Temp 97.7 F (36.5 C) (Oral)   Ht '6\' 3"'$  (1.905 m)   Wt 183 lb 3.2 oz (83.1 kg)   SpO2 99%   BMI 22.90 kg/m   BP Readings from Last 3 Encounters:  07/26/22 116/66  06/08/22 113/67  03/07/22 103/65    Wt Readings from Last 3 Encounters:  07/26/22 183 lb 3.2 oz (83.1 kg)  06/08/22 181 lb (82.1 kg)  03/07/22 183 lb 6.4 oz (83.2 kg)    Physical Exam Vitals reviewed.   Constitutional:      General: He is not in acute distress.    Appearance: Normal appearance. He is normal weight. He is not ill-appearing, toxic-appearing or diaphoretic.  HENT:     Head: Normocephalic.  Eyes:     General: No scleral icterus.       Right eye: No discharge.        Left eye: No discharge.     Conjunctiva/sclera: Conjunctivae normal.  Cardiovascular:     Rate and Rhythm: Normal rate and regular rhythm.     Heart sounds: Normal heart sounds.  Pulmonary:     Effort: Pulmonary effort is normal. No respiratory distress.     Breath sounds: Normal breath sounds.  Musculoskeletal:        General: Normal range of motion.     Cervical back: Normal range of motion.  Skin:    General: Skin is warm and dry.  Neurological:     General: No focal deficit present.     Mental Status: He is alert and oriented to person, place, and time. Mental status is at baseline.  Psychiatric:        Mood and Affect: Mood normal.        Behavior: Behavior normal.        Thought Content: Thought content normal.        Judgment: Judgment normal.     No results found for: "HGBA1C"  Lab Results  Component Value Date   CREATININE 0.85 06/08/2022   CREATININE 0.87 04/21/2022   CREATININE 0.77 02/25/2022    Lab Results  Component Value Date   WBC 6.9 06/08/2022   HGB 13.6 06/08/2022   HCT 39.5 06/08/2022   PLT 266 06/08/2022   GLUCOSE 154 (H) 06/08/2022   CHOL 194 09/15/2021   TRIG 130.0 09/15/2021   HDL 53.70 09/15/2021   LDLDIRECT 129.0 03/31/2017   LDLCALC 115 (H) 09/15/2021   ALT 29 06/08/2022   AST 31 06/08/2022   NA 137 06/08/2022   K 3.9 06/08/2022   CL 102 06/08/2022   CREATININE 0.85 06/08/2022   BUN 24 (H) 06/08/2022   CO2 27 06/08/2022   TSH 5.649 (H) 07/20/2021   PSA 0.51 04/27/2020   INR 1.0 11/11/2021    NM PET Image Restag (PS) Skull Base To Thigh  Result Date: 06/01/2022 CLINICAL DATA:  Subsequent treatment strategy for pancreatic cancer. History of  prostate cancer also. EXAM: NUCLEAR MEDICINE PET SKULL BASE TO THIGH TECHNIQUE: 10.24 mCi F-18 FDG was injected intravenously. Full-ring PET imaging was performed from the skull base to thigh after the radiotracer. CT data was obtained and used for attenuation correction and anatomic localization. Fasting blood  glucose: 106 mg/dl COMPARISON:  PET-CT 09/29/2020.  Most recent CT scan is 02/25/2022 FINDINGS: Mediastinal blood pool activity: SUV max 1.86 Liver activity: SUV max NA NECK: Persistent hypermetabolic 2 cm nodule in the isthmic region of the thyroid gland. SUV max is 4.11 and was previously 4.59. Recommend thyroid ultrasound and consideration for biopsy if not already. No hypermetabolic lymphadenopathy in the neck. Incidental CT findings: None. CHEST: No hypermetabolic mediastinal or hilar adenopathy. No supraclavicular or axillary adenopathy. No hypermetabolic pulmonary lesions. Stable bilateral pulmonary nodules. No new pulmonary nodules. Incidental CT findings: Stable emphysematous changes and pulmonary scarring. No acute overlying pulmonary process. Stable advanced vascular disease. ABDOMEN/PELVIS: Status post partial pancreatectomy and splenectomy. No PET-CT findings for residual or recurrent tumor. No metastatic disease involving the abdomen/pelvis. Incidental CT findings: Stable gallstones. 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 Stable vascular disease. Brachytherapy sees or biopsy clips the prostate gland. SKELETON: No hypermetabolic bone lesions to suggest active metastatic disease. Incidental CT findings: Stable scattered sclerotic bone lesions. IMPRESSION: 1. Status post partial pancreatectomy and splenectomy. No findings for residual or recurrent tumor. 2. No findings for metastatic disease involving the abdomen/pelvis. 3. Stable bilateral pulmonary nodules. 4. Stable scattered sclerotic bone lesions but no hypermetabolic bone disease. 5. Persistent hypermetabolic isthmic thyroid nodule.  Recommend thyroid ultrasound and consideration for biopsy if not already performed (and if clinically necessary given patient's age and comorbidities). Electronically Signed   By: Marijo Sanes M.D.   On: 06/01/2022 08:34    Assessment & Plan:  .Malignant neoplasm of body of pancreas [facility]) Assessment & Plan: Currently undergoing modified chemotherapy managed by Dr Janese Banks.  Weight is stable and CBC normal.     Thyroid nodule Assessment & Plan: He has deferred workup unless nodule has enlarged by next scan   Orders: -     TSH; Future  Impaired fasting glucose -     Hemoglobin A1c; Future  Pure hypercholesterolemia -     Lipid Panel w/reflex Direct LDL; Future -     Comprehensive metabolic panel; Future  Chronic right hip pain Assessment & Plan: Referring to PT at [facility] for pain brought on by inactivity and improved with walking . Reassurance given   Orders: -     Ambulatory referral to Physical Therapy  Anxiety about health Assessment & Plan: Secondary to history of pancreatic cancer,  He is managing his anxiety with meditation but requiring alprazolam at night       I provided 30 minutes of face-to-face time during this encounter reviewing patient's last visit with me, patient's  most recent visit with oncology ,  recent surgical and non surgical procedures, previous  labs and imaging studies, counseling on currently addressed issues,  and post visit ordering to diagnostics and therapeutics .   Follow-up: No follow-ups on file.   Crecencio Mc, MD

## 2022-07-26 NOTE — Assessment & Plan Note (Signed)
Currently undergoing modified chemotherapy managed by Dr Janese Banks.  Weight is stable and CBC normal.

## 2022-07-26 NOTE — Assessment & Plan Note (Signed)
He has deferred workup unless nodule has enlarged by next scan

## 2022-07-26 NOTE — Patient Instructions (Addendum)
I will make the referral to [facility] ([facility]) for your right hip pain   Ok to use aleve every 12 hours   You can add up to 1000 mg of acetominophen (tylenol) every day safely  In divided doses (500 mg  every 12 hours.)    For insomnia:   You might want to try using Relaxium for insomnia  (as seen on TV commercials) . It is available contains:  Melatonin 5 mg  Chamomile 25 mg Passionflower extract 75 mg GABA 100 mg Ashwaganda extract 125 mg Magnesium citrate, glycinate, oxide (100 mg)  L tryptophan 500 mg Valerest (proprietary  ingredient ; probably valeria root extract)

## 2022-07-27 DIAGNOSIS — H31093 Other chorioretinal scars, bilateral: Secondary | ICD-10-CM | POA: Diagnosis not present

## 2022-07-27 DIAGNOSIS — H353211 Exudative age-related macular degeneration, right eye, with active choroidal neovascularization: Secondary | ICD-10-CM | POA: Diagnosis not present

## 2022-07-27 DIAGNOSIS — H26492 Other secondary cataract, left eye: Secondary | ICD-10-CM | POA: Diagnosis not present

## 2022-07-27 DIAGNOSIS — H353223 Exudative age-related macular degeneration, left eye, with inactive scar: Secondary | ICD-10-CM | POA: Diagnosis not present

## 2022-07-27 DIAGNOSIS — H43813 Vitreous degeneration, bilateral: Secondary | ICD-10-CM | POA: Diagnosis not present

## 2022-07-28 ENCOUNTER — Encounter: Payer: Self-pay | Admitting: *Deleted

## 2022-07-29 ENCOUNTER — Other Ambulatory Visit (INDEPENDENT_AMBULATORY_CARE_PROVIDER_SITE_OTHER): Payer: PPO

## 2022-07-29 ENCOUNTER — Telehealth: Payer: Self-pay | Admitting: Internal Medicine

## 2022-07-29 DIAGNOSIS — M545 Low back pain, unspecified: Secondary | ICD-10-CM | POA: Diagnosis not present

## 2022-07-29 DIAGNOSIS — R300 Dysuria: Secondary | ICD-10-CM | POA: Diagnosis not present

## 2022-07-29 DIAGNOSIS — E7800 Pure hypercholesterolemia, unspecified: Secondary | ICD-10-CM | POA: Diagnosis not present

## 2022-07-29 DIAGNOSIS — R7301 Impaired fasting glucose: Secondary | ICD-10-CM | POA: Diagnosis not present

## 2022-07-29 DIAGNOSIS — R2689 Other abnormalities of gait and mobility: Secondary | ICD-10-CM | POA: Diagnosis not present

## 2022-07-29 DIAGNOSIS — G8929 Other chronic pain: Secondary | ICD-10-CM | POA: Diagnosis not present

## 2022-07-29 DIAGNOSIS — E041 Nontoxic single thyroid nodule: Secondary | ICD-10-CM | POA: Diagnosis not present

## 2022-07-29 DIAGNOSIS — M6281 Muscle weakness (generalized): Secondary | ICD-10-CM | POA: Diagnosis not present

## 2022-07-29 DIAGNOSIS — E139 Other specified diabetes mellitus without complications: Secondary | ICD-10-CM

## 2022-07-29 DIAGNOSIS — M25551 Pain in right hip: Secondary | ICD-10-CM | POA: Diagnosis not present

## 2022-07-29 LAB — URINALYSIS, ROUTINE W REFLEX MICROSCOPIC
Bilirubin Urine: NEGATIVE
Hgb urine dipstick: NEGATIVE
Ketones, ur: NEGATIVE
Leukocytes,Ua: NEGATIVE
Nitrite: NEGATIVE
Specific Gravity, Urine: 1.015 (ref 1.000–1.030)
Total Protein, Urine: NEGATIVE
Urine Glucose: NEGATIVE
Urobilinogen, UA: 0.2 (ref 0.0–1.0)
pH: 6 (ref 5.0–8.0)

## 2022-07-29 LAB — LIPID PANEL
Cholesterol: 181 mg/dL (ref 0–200)
HDL: 42.5 mg/dL (ref >39.00)
LDL Cholesterol: 115 mg/dL — ABNORMAL HIGH (ref 0–99)
NonHDL: 138.94
Total CHOL/HDL Ratio: 4
Triglycerides: 122 mg/dL (ref 0.0–149.0)
VLDL: 24.4 mg/dL (ref 0.0–40.0)

## 2022-07-29 LAB — COMPREHENSIVE METABOLIC PANEL
ALT: 27 U/L (ref 0–53)
AST: 26 U/L (ref 0–37)
Albumin: 3.9 g/dL (ref 3.5–5.2)
Alkaline Phosphatase: 46 U/L (ref 39–117)
BUN: 25 mg/dL — ABNORMAL HIGH (ref 6–23)
CO2: 30 mEq/L (ref 19–32)
Calcium: 9 mg/dL (ref 8.4–10.5)
Chloride: 102 mEq/L (ref 96–112)
Creatinine, Ser: 0.93 mg/dL (ref 0.40–1.50)
GFR: 73.46 mL/min (ref >60.00)
Glucose, Bld: 118 mg/dL — ABNORMAL HIGH (ref 70–99)
Potassium: 4.3 mEq/L (ref 3.5–5.1)
Sodium: 142 mEq/L (ref 135–145)
Total Bilirubin: 0.6 mg/dL (ref 0.2–1.2)
Total Protein: 6.3 g/dL (ref 6.0–8.3)

## 2022-07-29 LAB — TSH: TSH: 5.68 u[IU]/mL — ABNORMAL HIGH (ref 0.35–5.50)

## 2022-07-29 LAB — LDL CHOLESTEROL, DIRECT: Direct LDL: 108 mg/dL

## 2022-07-29 LAB — HEMOGLOBIN A1C: Hgb A1c MFr Bld: 6.8 % — ABNORMAL HIGH (ref 4.6–6.5)

## 2022-07-29 NOTE — Telephone Encounter (Signed)
I spoke with patient at lab appt this morning about Prolia. Pt is going to wait until closer to due date before scheduling. He has a scan next month & will make his decision based on the results. Pt informed to please let us know either way if her chooses to continue with Prolia or not.

## 2022-07-29 NOTE — Telephone Encounter (Signed)
Orders have been placed.

## 2022-07-29 NOTE — Telephone Encounter (Signed)
Pt called stating he is coming in for lab work today and want a urine test done because he is having UTIs and irritated urination

## 2022-07-30 LAB — URINE CULTURE
MICRO NUMBER:: 14545001
Result:: NO GROWTH
SPECIMEN QUALITY:: ADEQUATE

## 2022-07-31 DIAGNOSIS — E139 Other specified diabetes mellitus without complications: Secondary | ICD-10-CM | POA: Insufficient documentation

## 2022-07-31 NOTE — Assessment & Plan Note (Signed)
Likely secondary to pancreatic CA and subsequent chemotherapy.  Patient will return for management  Lab Results  Component Value Date   HGBA1C 6.8 (H) 07/29/2022

## 2022-08-03 ENCOUNTER — Ambulatory Visit (INDEPENDENT_AMBULATORY_CARE_PROVIDER_SITE_OTHER): Payer: PPO | Admitting: Internal Medicine

## 2022-08-03 ENCOUNTER — Encounter: Payer: Self-pay | Admitting: Internal Medicine

## 2022-08-03 VITALS — BP 122/72 | HR 62 | Temp 97.4°F | Ht 75.0 in | Wt 182.0 lb

## 2022-08-03 DIAGNOSIS — I700 Atherosclerosis of aorta: Secondary | ICD-10-CM

## 2022-08-03 DIAGNOSIS — E1165 Type 2 diabetes mellitus with hyperglycemia: Secondary | ICD-10-CM | POA: Diagnosis not present

## 2022-08-03 DIAGNOSIS — M818 Other osteoporosis without current pathological fracture: Secondary | ICD-10-CM

## 2022-08-03 MED ORDER — BLOOD GLUCOSE TEST VI STRP: ORAL_STRIP | 0 refills | Status: DC

## 2022-08-03 MED ORDER — LANCETS MISC. MISC: 1.0000 | Freq: Every day | 0 refills | Status: AC

## 2022-08-03 MED ORDER — BLOOD GLUCOSE MONITORING SUPPL DEVI: 1.0000 | Freq: Every day | 0 refills | Status: DC

## 2022-08-03 MED ORDER — LANCET DEVICE MISC: 1.0000 | Freq: Every day | 0 refills | Status: AC

## 2022-08-03 NOTE — Patient Instructions (Addendum)
Annual Wellness Visit is due at anytime. Please schedule at check out today.   If you are unsure how to use your new glucose meter, call the office and we will schedule you a nurse visit to give you instruction  Please check your blood sugars ONCE  daily:  1) fasting OR 2) 2 hours after a meal (you can vary from day to day which meal you check)  Return in 2 weeks with your readings

## 2022-08-03 NOTE — Progress Notes (Signed)
Subjective:  Patient ID: George Owens, male    DOB: 1932-06-09  Age: 89 y.o. MRN: [id number]  CC: The primary encounter diagnosis was Type 2 diabetes mellitus with hyperglycemia, without long-term current use of insulin ([place]). Diagnoses of Other osteoporosis without current pathological fracture and Thoracic aortic atherosclerosis ([place]) were also pertinent to this visit.   HPI George Owens presents for follow up on recent labs Chief Complaint  Patient presents with   Medical Management of Chronic Issues    Discuss A1c   1)  thyroid  borderline underactive.   4) YA1c is elevated and diagnostic of type 2 Diabetes and confirms the diagnosis suggested by the last several elevated blood glucose readings noted on HIS  labs from December and November.  Lab Results  Component Value Date   HGBA1C 6.8 (H) 07/29/2022       Outpatient Medications Prior to Visit  Medication Sig Dispense Refill   acetaminophen (TYLENOL) 500 MG tablet Take 1,000 mg by mouth every 6 (six) hours as needed for moderate pain.     Calcium Carb-Cholecalciferol (CALCIUM 600 + D PO) Take 1 tablet by mouth daily.     cholecalciferol (VITAMIN D3) 25 MCG (1000 UNIT) tablet Take 1,000 Units by mouth daily.     Cranberry 500 MG CAPS Take 500 mg by mouth daily.     Denosumab (PROLIA [place]) Inject 1 Dose into the skin every 6 (six) months.     hydrocortisone (ANUSOL-HC) 2.5 % rectal cream Place 1 application rectally 2 (two) times daily. 30 g 0   loratadine (CLARITIN) 10 MG tablet Take 10 mg by mouth daily as needed for allergies.     metoprolol succinate (TOPROL-XL) 50 MG 24 hr tablet Take 1.5 tablets (75 mg total) by mouth daily. 30 tablet 1   omeprazole (PRILOSEC) 40 MG capsule TAKE 1 CAPSULE (40 MG TOTAL) BY MOUTH DAILY. 90 capsule 3   Polyethyl Glycol-Propyl Glycol (SYSTANE OP) Place 1 drop into both eyes daily.     Ranibizumab (LUCENTIS IO) Place 1 Dose into the right eye See admin instructions. Every 14 weeks      tamsulosin (FLOMAX) 0.4 MG CAPS capsule TAKE 1 CAPSULE BY MOUTH EVERY DAY AFTER SUPPER 90 capsule 1   UNABLE TO FIND Med Name: Relaxium to help with sleep     vitamin B-12 (CYANOCOBALAMIN) 1000 MCG tablet Take 1,000 mcg by mouth daily.     temazepam (RESTORIL) 30 MG capsule TAKE 1 CAPSULE BY MOUTH AT BEDTIME AS NEEDED FOR SLEEP (Patient not taking: Reported on 08/03/2022) 30 capsule 3   Facility-Administered Medications Prior to Visit  Medication Dose Route Frequency Provider Last Rate Last Admin   heparin lock flush 100 UNIT/ML injection            heparin lock flush 100 unit/mL  500 Units Intravenous Once Sindy Guadeloupe, MD        Review of Systems;  Patient denies headache, fevers, malaise, unintentional weight loss, skin rash, eye pain, sinus congestion and sinus pain, sore throat, dysphagia,  hemoptysis , cough, dyspnea, wheezing, chest pain, palpitations, orthopnea, edema, abdominal pain, nausea, melena, diarrhea, constipation, flank pain, dysuria, hematuria, urinary  Frequency, nocturia, numbness, tingling, seizures,  Focal weakness, Loss of consciousness,  Tremor, insomnia, depression, anxiety, and suicidal ideation.      Objective:  BP 122/72   Pulse 62   Temp (!) 97.4 F (36.3 C) (Oral)   Ht 6' 3"$  (1.905 m)   Wt 182 lb (  82.6 kg)   SpO2 95%   BMI 22.75 kg/m   BP Readings from Last 3 Encounters:  08/03/22 122/72  07/26/22 116/66  06/08/22 113/67    Wt Readings from Last 3 Encounters:  08/03/22 182 lb (82.6 kg)  07/26/22 183 lb 3.2 oz (83.1 kg)  06/08/22 181 lb (82.1 kg)    Physical Exam Vitals reviewed.  Constitutional:      General: He is not in acute distress.    Appearance: Normal appearance. He is normal weight. He is not ill-appearing, toxic-appearing or diaphoretic.  HENT:     Head: Normocephalic and atraumatic.     Right Ear: Tympanic membrane, ear canal and external ear normal. There is no impacted cerumen.     Left Ear: Tympanic membrane, ear canal and  external ear normal. There is no impacted cerumen.     Nose: Nose normal.     Mouth/Throat:     Mouth: Mucous membranes are moist.     Pharynx: Oropharynx is clear.  Eyes:     General: No scleral icterus.       Right eye: No discharge.        Left eye: No discharge.     Conjunctiva/sclera: Conjunctivae normal.  Neck:     Thyroid: No thyromegaly.     Vascular: No carotid bruit or JVD.  Cardiovascular:     Rate and Rhythm: Normal rate and regular rhythm.     Heart sounds: Normal heart sounds.  Pulmonary:     Effort: Pulmonary effort is normal. No respiratory distress.     Breath sounds: Normal breath sounds.  Abdominal:     General: Bowel sounds are normal.     Palpations: Abdomen is soft. There is no mass.     Tenderness: There is no abdominal tenderness. There is no guarding or rebound.  Musculoskeletal:        General: Normal range of motion.     Cervical back: Normal range of motion and neck supple.  Lymphadenopathy:     Cervical: No cervical adenopathy.  Skin:    General: Skin is warm and dry.  Neurological:     General: No focal deficit present.     Mental Status: He is alert and oriented to person, place, and time. Mental status is at baseline.  Psychiatric:        Mood and Affect: Mood normal.        Behavior: Behavior normal.        Thought Content: Thought content normal.        Judgment: Judgment normal.     Lab Results  Component Value Date   HGBA1C 6.8 (H) 07/29/2022    Lab Results  Component Value Date   CREATININE 0.93 07/29/2022   CREATININE 0.85 06/08/2022   CREATININE 0.87 04/21/2022    Lab Results  Component Value Date   WBC 6.9 06/08/2022   HGB 13.6 06/08/2022   HCT 39.5 06/08/2022   PLT 266 06/08/2022   GLUCOSE 118 (H) 07/29/2022   CHOL 181 07/29/2022   TRIG 122.0 07/29/2022   HDL 42.50 07/29/2022   LDLDIRECT 108.0 07/29/2022   LDLCALC 115 (H) 07/29/2022   ALT 27 07/29/2022   AST 26 07/29/2022   NA 142 07/29/2022   K 4.3  07/29/2022   CL 102 07/29/2022   CREATININE 0.93 07/29/2022   BUN 25 (H) 07/29/2022   CO2 30 07/29/2022   TSH 5.68 (H) 07/29/2022   PSA 0.51 04/27/2020   INR 1.0 11/11/2021  HGBA1C 6.8 (H) 07/29/2022    NM PET Image Restag (PS) Skull Base To Thigh  Result Date: 06/01/2022 CLINICAL DATA:  Subsequent treatment strategy for pancreatic cancer. History of prostate cancer also. EXAM: NUCLEAR MEDICINE PET SKULL BASE TO THIGH TECHNIQUE: 10.24 mCi F-18 FDG was injected intravenously. Full-ring PET imaging was performed from the skull base to thigh after the radiotracer. CT data was obtained and used for attenuation correction and anatomic localization. Fasting blood glucose: 106 mg/dl COMPARISON:  PET-CT 09/29/2020.  Most recent CT scan is 02/25/2022 FINDINGS: Mediastinal blood pool activity: SUV max 1.86 Liver activity: SUV max NA NECK: Persistent hypermetabolic 2 cm nodule in the isthmic region of the thyroid gland. SUV max is 4.11 and was previously 4.59. Recommend thyroid ultrasound and consideration for biopsy if not already. No hypermetabolic lymphadenopathy in the neck. Incidental CT findings: None. CHEST: No hypermetabolic mediastinal or hilar adenopathy. No supraclavicular or axillary adenopathy. No hypermetabolic pulmonary lesions. Stable bilateral pulmonary nodules. No new pulmonary nodules. Incidental CT findings: Stable emphysematous changes and pulmonary scarring. No acute overlying pulmonary process. Stable advanced vascular disease. ABDOMEN/PELVIS: Status post partial pancreatectomy and splenectomy. No PET-CT findings for residual or recurrent tumor. No metastatic disease involving the abdomen/pelvis. Incidental CT findings: Stable gallstones. 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 Stable vascular disease. Brachytherapy sees or biopsy clips the prostate gland. SKELETON: No hypermetabolic bone lesions to suggest active metastatic disease. Incidental CT findings: Stable scattered sclerotic  bone lesions. IMPRESSION: 1. Status post partial pancreatectomy and splenectomy. No findings for residual or recurrent tumor. 2. No findings for metastatic disease involving the abdomen/pelvis. 3. Stable bilateral pulmonary nodules. 4. Stable scattered sclerotic bone lesions but no hypermetabolic bone disease. 5. Persistent hypermetabolic isthmic thyroid nodule. Recommend thyroid ultrasound and consideration for biopsy if not already performed (and if clinically necessary given patient's age and comorbidities). Electronically Signed   By: Marijo Sanes M.D.   On: 06/01/2022 08:34    Assessment & Plan:  .Type 2 diabetes mellitus with hyperglycemia, without long-term current use of insulin ([facility]) Assessment & Plan: Recent fasting  glucose is elevated and  diagnostic of diabetes.  Based on her  A1c,   HE DOES NOT need medications at this time.  he was encouraged to schedule an annual eye exam and to make  lifestyle changes including low glycemic index diet  participate regularly in aerobic  Exercise, and to follow up in 6 months.   Orders: -     Blood Glucose Monitoring Suppl; 1 each by Does not apply route daily. May substitute to any manufacturer covered by patient's insurance.  Dispense: 1 each; Refill: 0 -     Blood Glucose Test; May substitute to any manufacturer covered by patient's insurance.  Use to  check blood sugar once daily at variable times  Dispense: 100 strip; Refill: 0 -     Lancet Device; 1 each by Does not apply route daily. May substitute to any manufacturer covered by patient's insurance.  Dispense: 1 each; Refill: 0 -     Lancets Misc.; 1 each by Does not apply route daily. May substitute to any manufacturer covered by patient's insurance.  Dispense: 100 each; Refill: 0  Other osteoporosis without current pathological fracture Assessment & Plan: Managed wit PRolia , last dose was September  2023.  Last dose was Spring 2023.     Thoracic aortic atherosclerosis [facility]) Assessment &  Plan: Reviewed findings of prior CT scan today..  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   Follow-up: Return in about 2 weeks (around 08/17/2022) for follow up diabetes.   Crecencio Mc, MD

## 2022-08-05 DIAGNOSIS — E1165 Type 2 diabetes mellitus with hyperglycemia: Secondary | ICD-10-CM | POA: Insufficient documentation

## 2022-08-05 NOTE — Assessment & Plan Note (Signed)
Reviewed findings of prior CT scan today..  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

## 2022-08-05 NOTE — Assessment & Plan Note (Addendum)
Recent fasting  glucose is elevated and  diagnostic of diabetes.  Based on her  A1c,   HE DOES NOT need medications at this time.  he was encouraged to schedule an annual eye exam and to make  lifestyle changes including low glycemic index diet  participate regularly in aerobic  Exercise, and to follow up in 6 months.

## 2022-08-05 NOTE — Assessment & Plan Note (Addendum)
Managed wit PRolia , last dose was September  2023.  Last dose was Spring 2023.

## 2022-08-08 ENCOUNTER — Telehealth: Payer: Self-pay | Admitting: Internal Medicine

## 2022-08-08 DIAGNOSIS — G8929 Other chronic pain: Secondary | ICD-10-CM | POA: Diagnosis not present

## 2022-08-08 DIAGNOSIS — M25551 Pain in right hip: Secondary | ICD-10-CM | POA: Diagnosis not present

## 2022-08-08 DIAGNOSIS — R2689 Other abnormalities of gait and mobility: Secondary | ICD-10-CM | POA: Diagnosis not present

## 2022-08-08 DIAGNOSIS — M545 Low back pain, unspecified: Secondary | ICD-10-CM | POA: Diagnosis not present

## 2022-08-08 DIAGNOSIS — M6281 Muscle weakness (generalized): Secondary | ICD-10-CM | POA: Diagnosis not present

## 2022-08-08 MED ORDER — ONETOUCH ULTRASOFT LANCETS MISC: 2 refills | Status: DC

## 2022-08-08 MED ORDER — ONETOUCH VERIO VI STRP: ORAL_STRIP | 2 refills | Status: DC

## 2022-08-08 MED ORDER — ONETOUCH VERIO W/DEVICE KIT: PACK | 0 refills | Status: DC

## 2022-08-08 NOTE — Telephone Encounter (Signed)
Pt called in staying that his previous blood sugar device that was prescribed to him its not cover under his insurance. As pert pt, the only device that its cover its One Touch, he would like to know if Dr. Derrel Nip can send this over to [facility] on [address]?? And also he would like to know if he appt he need to be rescheduled because of this delay on this device??

## 2022-08-08 NOTE — Telephone Encounter (Signed)
Pt called back and I read the message to him and he understood

## 2022-08-08 NOTE — Telephone Encounter (Signed)
noted 

## 2022-08-08 NOTE — Telephone Encounter (Signed)
LMTCB. Need to let pt know that the glucose meter that is covered by his insurance has been sent. He will also need to reschedule his appt to two weeks from the day he starts checking his sugar.

## 2022-08-09 ENCOUNTER — Other Ambulatory Visit: Payer: Self-pay

## 2022-08-09 MED ORDER — ONETOUCH VERIO VI STRP: ORAL_STRIP | 2 refills | Status: DC

## 2022-08-09 MED ORDER — ONETOUCH ULTRASOFT LANCETS MISC: 2 refills | Status: DC

## 2022-08-09 MED ORDER — ONETOUCH VERIO W/DEVICE KIT: PACK | 0 refills | Status: DC

## 2022-08-09 NOTE — Telephone Encounter (Signed)
Pt called back. Still have some questions regarding the blood sugar monitor. Call was transferred to [facility].

## 2022-08-09 NOTE — Telephone Encounter (Signed)
Spoke with pt pharmacy and they needed a new rx with the ICD 10 code on it. Rxs have been resent and pt is aware.

## 2022-08-10 DIAGNOSIS — M6281 Muscle weakness (generalized): Secondary | ICD-10-CM | POA: Diagnosis not present

## 2022-08-10 DIAGNOSIS — R2689 Other abnormalities of gait and mobility: Secondary | ICD-10-CM | POA: Diagnosis not present

## 2022-08-10 DIAGNOSIS — M545 Low back pain, unspecified: Secondary | ICD-10-CM | POA: Diagnosis not present

## 2022-08-10 DIAGNOSIS — M25551 Pain in right hip: Secondary | ICD-10-CM | POA: Diagnosis not present

## 2022-08-10 DIAGNOSIS — G8929 Other chronic pain: Secondary | ICD-10-CM | POA: Diagnosis not present

## 2022-08-15 DIAGNOSIS — G8929 Other chronic pain: Secondary | ICD-10-CM | POA: Diagnosis not present

## 2022-08-15 DIAGNOSIS — R2689 Other abnormalities of gait and mobility: Secondary | ICD-10-CM | POA: Diagnosis not present

## 2022-08-15 DIAGNOSIS — M545 Low back pain, unspecified: Secondary | ICD-10-CM | POA: Diagnosis not present

## 2022-08-15 DIAGNOSIS — M25551 Pain in right hip: Secondary | ICD-10-CM | POA: Diagnosis not present

## 2022-08-15 DIAGNOSIS — M6281 Muscle weakness (generalized): Secondary | ICD-10-CM | POA: Diagnosis not present

## 2022-08-17 ENCOUNTER — Ambulatory Visit: Payer: PPO | Admitting: Internal Medicine

## 2022-08-18 DIAGNOSIS — M25551 Pain in right hip: Secondary | ICD-10-CM | POA: Diagnosis not present

## 2022-08-18 DIAGNOSIS — G8929 Other chronic pain: Secondary | ICD-10-CM | POA: Diagnosis not present

## 2022-08-18 DIAGNOSIS — M6281 Muscle weakness (generalized): Secondary | ICD-10-CM | POA: Diagnosis not present

## 2022-08-18 DIAGNOSIS — M545 Low back pain, unspecified: Secondary | ICD-10-CM | POA: Diagnosis not present

## 2022-08-18 DIAGNOSIS — R2689 Other abnormalities of gait and mobility: Secondary | ICD-10-CM | POA: Diagnosis not present

## 2022-08-23 ENCOUNTER — Ambulatory Visit (INDEPENDENT_AMBULATORY_CARE_PROVIDER_SITE_OTHER): Payer: PPO | Admitting: Internal Medicine

## 2022-08-23 ENCOUNTER — Encounter: Payer: Self-pay | Admitting: Internal Medicine

## 2022-08-23 VITALS — BP 116/58 | HR 79 | Temp 97.8°F | Ht 75.0 in | Wt 182.2 lb

## 2022-08-23 DIAGNOSIS — E139 Other specified diabetes mellitus without complications: Secondary | ICD-10-CM

## 2022-08-23 DIAGNOSIS — E559 Vitamin D deficiency, unspecified: Secondary | ICD-10-CM | POA: Diagnosis not present

## 2022-08-23 DIAGNOSIS — K8689 Other specified diseases of pancreas: Secondary | ICD-10-CM | POA: Diagnosis not present

## 2022-08-23 DIAGNOSIS — E1165 Type 2 diabetes mellitus with hyperglycemia: Secondary | ICD-10-CM

## 2022-08-23 NOTE — Patient Instructions (Addendum)
Your Medicare Annual Wellness visit is due, please schedule this appointment at checkout.   Your blood sugars are MINIMALLY elevated.  Not enough to start medication.  No medications for diabetes management is necessary at this time, but please Continue periodic monitoring of sugars and notify me if they start to be consistently > 200    We will repeat your A1c on May 10   Tetanus  booster is due In the Fall  free at your pharmacy

## 2022-08-23 NOTE — Progress Notes (Signed)
Subjective:  Patient ID: George Owens, male    DOB: December 14, 1931  Age: 89 y.o. 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 Diagnoses of Secondary diabetes ([place]) and Vitamin D deficiency were also pertinent to this visit.   HPI George Owens presents for follow up on recent diagnosis of diabetes mellitus Chief Complaint  Patient presents with   Medical Management of Chronic Issues    2 week follow up on diabetes   Type 3c DM:  recent diagnosis,  A1c 6.8 on Feb 9.   he has been reducing his carbohydrate content and  checking his  sugars once daily in the morning, usually.   cbgs have ranged from 126 to 154 and post prandials 175 to 188   S/p pancreatic resection/chemotherapy for CA:  His weight has been stable.  His BMI is 22.  He finds that he can no longer digest raw vegetables , even with use of Creon,  so  He no longer takes Creon, and actually uses stool softenr for maagement of constipation  Pulmonary nodules :  all < 1 cm, not amenable to biopsy.  Repeat CT planned in April by oncologist    Outpatient Medications Prior to Visit  Medication Sig Dispense Refill   acetaminophen (TYLENOL) 500 MG tablet Take 1,000 mg by mouth every 6 (six) hours as needed for moderate pain.     Blood Glucose Monitoring Suppl (ONETOUCH VERIO) w/Device KIT Please use to check blood sugars once daily. Dx code: E11.65 1 kit 0   Blood Glucose Monitoring Suppl DEVI 1 each by Does not apply route daily. May substitute to any manufacturer covered by patient's insurance. 1 each 0   Calcium Carb-Cholecalciferol (CALCIUM 600 + D PO) Take 1 tablet by mouth daily.     cholecalciferol (VITAMIN D3) 25 MCG (1000 UNIT) tablet Take 1,000 Units by mouth daily.     Cranberry 500 MG CAPS Take 500 mg by mouth daily.     Denosumab (PROLIA [place]) Inject 1 Dose into the skin every 6 (six) months.     Glucose Blood (BLOOD GLUCOSE TEST STRIPS) STRP May substitute to any  manufacturer covered by patient's insurance.  Use to  check blood sugar once daily at variable times 100 strip 0   glucose blood (ONETOUCH VERIO) test strip Use to check blood sugars one time daily. Dx code: E11.65 100 each 2   hydrocortisone (ANUSOL-HC) 2.5 % rectal cream Place 1 application rectally 2 (two) times daily. 30 g 0   Lancet Device MISC 1 each by Does not apply route daily. May substitute to any manufacturer covered by patient's insurance. 1 each 0   Lancets (ONETOUCH ULTRASOFT) lancets Use to check blood sugars once daily. Dx code: E11.65 100 each 2   Lancets Misc. MISC 1 each by Does not apply route daily. May substitute to any manufacturer covered by patient's insurance. 100 each 0   loratadine (CLARITIN) 10 MG tablet Take 10 mg by mouth daily as needed for allergies.     metoprolol succinate (TOPROL-XL) 50 MG 24 hr tablet Take 1.5 tablets (75 mg total) by mouth daily. (Patient taking differently: Take 25 mg by mouth daily.) 30 tablet 1   omeprazole (PRILOSEC) 40 MG capsule TAKE 1 CAPSULE (40 MG TOTAL) BY MOUTH DAILY. 90 capsule 3   Polyethyl Glycol-Propyl Glycol (SYSTANE OP) Place 1 drop into both eyes daily.     Ranibizumab (LUCENTIS IO) Place 1 Dose into the right eye  See admin instructions. Every 14 weeks     tamsulosin (FLOMAX) 0.4 MG CAPS capsule TAKE 1 CAPSULE BY MOUTH EVERY DAY AFTER SUPPER 90 capsule 1   UNABLE TO FIND Med Name: Relaxium to help with sleep     vitamin B-12 (CYANOCOBALAMIN) 1000 MCG tablet Take 1,000 mcg by mouth daily.     Facility-Administered Medications Prior to Visit  Medication Dose Route Frequency Provider Last Rate Last Admin   heparin lock flush 100 UNIT/ML injection            heparin lock flush 100 unit/mL  500 Units Intravenous Once Sindy Guadeloupe, MD        Review of Systems;  Patient denies headache, fevers, malaise, unintentional weight loss, skin rash, eye pain, sinus congestion and sinus pain, sore throat, dysphagia,  hemoptysis ,  cough, dyspnea, wheezing, chest pain, palpitations, orthopnea, edema, abdominal pain, nausea, melena, diarrhea, constipation, flank pain, dysuria, hematuria, urinary  Frequency, nocturia, numbness, tingling, seizures,  Focal weakness, Loss of consciousness,  Tremor, insomnia, depression, anxiety, and suicidal ideation.      Objective:  BP (!) 116/58   Pulse 79   Temp 97.8 F (36.6 C) (Oral)   Ht '6\' 3"'$  (1.905 m)   Wt 182 lb 3.2 oz (82.6 kg)   SpO2 97%   BMI 22.77 kg/m   BP Readings from Last 3 Encounters:  08/23/22 (!) 116/58  08/03/22 122/72  07/26/22 116/66    Wt Readings from Last 3 Encounters:  08/23/22 182 lb 3.2 oz (82.6 kg)  08/03/22 182 lb (82.6 kg)  07/26/22 183 lb 3.2 oz (83.1 kg)    Physical Exam Vitals reviewed.  Constitutional:      General: He is not in acute distress.    Appearance: Normal appearance. He is normal weight. He is not ill-appearing, toxic-appearing or diaphoretic.  HENT:     Head: Normocephalic.  Eyes:     General: No scleral icterus.       Right eye: No discharge.        Left eye: No discharge.     Conjunctiva/sclera: Conjunctivae normal.  Cardiovascular:     Rate and Rhythm: Normal rate and regular rhythm.     Pulses:          Dorsalis pedis pulses are 2+ on the right side and 2+ on the left side.       Posterior tibial pulses are 2+ on the right side and 2+ on the left side.     Heart sounds: Normal heart sounds.  Pulmonary:     Effort: Pulmonary effort is normal. No respiratory distress.     Breath sounds: Normal breath sounds.  Musculoskeletal:        General: Normal range of motion.     Cervical back: Normal range of motion.  Feet:     Right foot:     Protective Sensation: 8 sites tested.  4 sites sensed.     Left foot:     Protective Sensation: 8 sites tested.  7 sites sensed.  Skin:    General: Skin is warm and dry.  Neurological:     General: No focal deficit present.     Mental Status: He is alert and oriented to  person, place, and time. Mental status is at baseline.  Psychiatric:        Mood and Affect: Mood normal.        Behavior: Behavior normal.        Thought Content: Thought  content normal.        Judgment: Judgment normal.    Lab Results  Component Value Date   HGBA1C 6.8 (H) 07/29/2022    Lab Results  Component Value Date   CREATININE 0.93 07/29/2022   CREATININE 0.85 06/08/2022   CREATININE 0.87 04/21/2022    Lab Results  Component Value Date   WBC 6.9 06/08/2022   HGB 13.6 06/08/2022   HCT 39.5 06/08/2022   PLT 266 06/08/2022   GLUCOSE 118 (H) 07/29/2022   CHOL 181 07/29/2022   TRIG 122.0 07/29/2022   HDL 42.50 07/29/2022   LDLDIRECT 108.0 07/29/2022   LDLCALC 115 (H) 07/29/2022   ALT 27 07/29/2022   AST 26 07/29/2022   NA 142 07/29/2022   K 4.3 07/29/2022   CL 102 07/29/2022   CREATININE 0.93 07/29/2022   BUN 25 (H) 07/29/2022   CO2 30 07/29/2022   TSH 5.68 (H) 07/29/2022   PSA 0.51 04/27/2020   INR 1.0 11/11/2021   HGBA1C 6.8 (H) 07/29/2022    NM PET Image Restag (PS) Skull Base To Thigh  Result Date: 06/01/2022 CLINICAL DATA:  Subsequent treatment strategy for pancreatic cancer. History of prostate cancer also. EXAM: NUCLEAR MEDICINE PET SKULL BASE TO THIGH TECHNIQUE: 10.24 mCi F-18 FDG was injected intravenously. Full-ring PET imaging was performed from the skull base to thigh after the radiotracer. CT data was obtained and used for attenuation correction and anatomic localization. Fasting blood glucose: 106 mg/dl COMPARISON:  PET-CT 09/29/2020.  Most recent CT scan is 02/25/2022 FINDINGS: Mediastinal blood pool activity: SUV max 1.86 Liver activity: SUV max NA NECK: Persistent hypermetabolic 2 cm nodule in the isthmic region of the thyroid gland. SUV max is 4.11 and was previously 4.59. Recommend thyroid ultrasound and consideration for biopsy if not already. No hypermetabolic lymphadenopathy in the neck. Incidental CT findings: None. CHEST: No hypermetabolic  mediastinal or hilar adenopathy. No supraclavicular or axillary adenopathy. No hypermetabolic pulmonary lesions. Stable bilateral pulmonary nodules. No new pulmonary nodules. Incidental CT findings: Stable emphysematous changes and pulmonary scarring. No acute overlying pulmonary process. Stable advanced vascular disease. ABDOMEN/PELVIS: Status post partial pancreatectomy and splenectomy. No PET-CT findings for residual or recurrent tumor. No metastatic disease involving the abdomen/pelvis. Incidental CT findings: Stable gallstones. 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 Stable vascular disease. Brachytherapy sees or biopsy clips the prostate gland. SKELETON: No hypermetabolic bone lesions to suggest active metastatic disease. Incidental CT findings: Stable scattered sclerotic bone lesions. IMPRESSION: 1. Status post partial pancreatectomy and splenectomy. No findings for residual or recurrent tumor. 2. No findings for metastatic disease involving the abdomen/pelvis. 3. Stable bilateral pulmonary nodules. 4. Stable scattered sclerotic bone lesions but no hypermetabolic bone disease. 5. Persistent hypermetabolic isthmic thyroid nodule. Recommend thyroid ultrasound and consideration for biopsy if not already performed (and if clinically necessary given patient's age and comorbidities). Electronically Signed   By: Marijo Sanes M.D.   On: 06/01/2022 08:34    Assessment & Plan:  .Secondary pancreatic insufficiency Assessment & Plan: Secondary to pancreatic resection for CA.  Using Creon to prevent malabsorption.  Stools are solid and weight is stable, but he has developed type 2 Diabetes  Orders: -     C-peptide  Secondary diabetes ([place]) Assessment & Plan: His diabetes is presumed 3c due to pancreatic surgery  and subsequent chemotherapy.  Patient is at risk for hypoglycemia due to large fluctuations in blood sugars  will not treat unless A1c is > 7.0  Lab Results  Component Value  Date   HGBA1C 6.8  (H) 07/29/2022     Orders: -     Hemoglobin A1c; Future -     Comprehensive metabolic panel; Future  Vitamin D deficiency -     VITAMIN D 25 Hydroxy (Vit-D Deficiency, Fractures); Future     I provided 40 minutes of face-to-face time during this encounter reviewing patient's last visit with me, patient's  most recent visit with oncology, previous  surgical and non surgical procedures, previous  labs and imaging studies, counseling on currently addressed issues,  and post visit ordering to diagnostics and therapeutics .   Follow-up: No follow-ups on file.   Crecencio Mc, MD

## 2022-08-23 NOTE — Assessment & Plan Note (Addendum)
His diabetes is presumed 3c due to pancreatic surgery  and subsequent chemotherapy.  Patient is at risk for hypoglycemia due to large fluctuations in blood sugars  will not treat unless A1c is > 7.0  Lab Results  Component Value Date   HGBA1C 6.8 (H) 07/29/2022

## 2022-08-23 NOTE — Assessment & Plan Note (Signed)
Secondary to pancreatic resection for CA.  Using Creon to prevent malabsorption.  Stools are solid and weight is stable, but he has developed type 2 Diabetes

## 2022-08-29 ENCOUNTER — Encounter: Payer: Self-pay | Admitting: *Deleted

## 2022-08-29 DIAGNOSIS — M818 Other osteoporosis without current pathological fracture: Secondary | ICD-10-CM

## 2022-09-05 NOTE — Telephone Encounter (Signed)
Pt called in asking to speak with Latoya CMA, concerning Prolia. Pt its available [phone].

## 2022-09-07 ENCOUNTER — Inpatient Hospital Stay: Payer: PPO | Attending: Oncology

## 2022-09-07 VITALS — BP 124/69 | HR 69 | Resp 18

## 2022-09-07 DIAGNOSIS — Z8507 Personal history of malignant neoplasm of pancreas: Secondary | ICD-10-CM | POA: Insufficient documentation

## 2022-09-07 DIAGNOSIS — Z95828 Presence of other vascular implants and grafts: Secondary | ICD-10-CM

## 2022-09-07 DIAGNOSIS — Z452 Encounter for adjustment and management of vascular access device: Secondary | ICD-10-CM | POA: Insufficient documentation

## 2022-09-07 MED ORDER — SODIUM CHLORIDE 0.9% FLUSH
10.0000 mL | Freq: Once | INTRAVENOUS | Status: AC
  Administered 2022-09-07: 10 mL via INTRAVENOUS
  Filled 2022-09-07: qty 10

## 2022-09-07 MED ORDER — HEPARIN SOD (PORK) LOCK FLUSH 100 UNIT/ML IV SOLN
500.0000 [IU] | Freq: Once | INTRAVENOUS | Status: AC
  Administered 2022-09-07: 500 [IU] via INTRAVENOUS
  Filled 2022-09-07: qty 5

## 2022-09-07 NOTE — Patient Instructions (Signed)

## 2022-09-08 ENCOUNTER — Encounter: Payer: Self-pay | Admitting: Internal Medicine

## 2022-09-08 DIAGNOSIS — M810 Age-related osteoporosis without current pathological fracture: Secondary | ICD-10-CM

## 2022-09-13 NOTE — Telephone Encounter (Signed)
Is it okay to send in a rx for the Prolia injection and let the nurse at the facility give it to him instead of him coming here to get injection?

## 2022-09-13 NOTE — Telephone Encounter (Signed)
See mychart message-I have archived his Prolia chart since he will be picking up Prolia at pharmacy & getting injection elsewhere.

## 2022-09-22 NOTE — Telephone Encounter (Signed)
Patient called to see if his Prolia was sent.

## 2022-09-23 MED ORDER — PROLIA 60 MG/ML ~~LOC~~ SOSY: 60.0000 mg | PREFILLED_SYRINGE | SUBCUTANEOUS | 1 refills | Status: DC

## 2022-09-23 NOTE — Addendum Note (Signed)
Addended by: Charlyne Mom D on: 09/23/2022 12:08 PM   Modules accepted: Orders

## 2022-09-29 ENCOUNTER — Other Ambulatory Visit (HOSPITAL_COMMUNITY): Payer: Self-pay

## 2022-09-29 NOTE — Telephone Encounter (Signed)
Pt called stating he received his prolia shot at [facility] by a nurse

## 2022-10-03 NOTE — Assessment & Plan Note (Signed)
Prolia injection given at [facility] April 2024

## 2022-10-07 ENCOUNTER — Ambulatory Visit
Admission: RE | Admit: 2022-10-07 | Discharge: 2022-10-07 | Disposition: A | Discharge status: Home / Self Care | Payer: PPO | Source: Ambulatory Visit | Attending: Oncology | Admitting: Oncology

## 2022-10-07 DIAGNOSIS — R918 Other nonspecific abnormal finding of lung field: Secondary | ICD-10-CM | POA: Diagnosis not present

## 2022-10-07 DIAGNOSIS — Z8507 Personal history of malignant neoplasm of pancreas: Secondary | ICD-10-CM | POA: Diagnosis not present

## 2022-10-07 DIAGNOSIS — K760 Fatty (change of) liver, not elsewhere classified: Secondary | ICD-10-CM | POA: Diagnosis not present

## 2022-10-07 DIAGNOSIS — Z08 Encounter for follow-up examination after completed treatment for malignant neoplasm: Secondary | ICD-10-CM | POA: Insufficient documentation

## 2022-10-07 DIAGNOSIS — C61 Malignant neoplasm of prostate: Secondary | ICD-10-CM | POA: Diagnosis not present

## 2022-10-07 MED ORDER — IOHEXOL 300 MG/ML  SOLN
100.0000 mL | Freq: Once | INTRAMUSCULAR | Status: AC | PRN
  Administered 2022-10-07: 100 mL via INTRAVENOUS

## 2022-10-10 ENCOUNTER — Encounter: Payer: Self-pay | Admitting: Oncology

## 2022-10-14 ENCOUNTER — Inpatient Hospital Stay: Payer: PPO | Attending: Oncology | Admitting: Oncology

## 2022-10-14 ENCOUNTER — Encounter: Payer: Self-pay | Admitting: Oncology

## 2022-10-14 ENCOUNTER — Other Ambulatory Visit: Payer: Self-pay | Admitting: *Deleted

## 2022-10-14 ENCOUNTER — Telehealth: Payer: Self-pay | Admitting: *Deleted

## 2022-10-14 ENCOUNTER — Inpatient Hospital Stay: Payer: PPO

## 2022-10-14 VITALS — BP 114/71 | HR 62 | Temp 96.6°F | Resp 16 | Ht 76.0 in | Wt 184.3 lb

## 2022-10-14 DIAGNOSIS — Z08 Encounter for follow-up examination after completed treatment for malignant neoplasm: Secondary | ICD-10-CM | POA: Diagnosis not present

## 2022-10-14 DIAGNOSIS — Z8249 Family history of ischemic heart disease and other diseases of the circulatory system: Secondary | ICD-10-CM | POA: Insufficient documentation

## 2022-10-14 DIAGNOSIS — Z8507 Personal history of malignant neoplasm of pancreas: Secondary | ICD-10-CM | POA: Diagnosis not present

## 2022-10-14 DIAGNOSIS — C251 Malignant neoplasm of body of pancreas: Secondary | ICD-10-CM

## 2022-10-14 DIAGNOSIS — Z823 Family history of stroke: Secondary | ICD-10-CM | POA: Insufficient documentation

## 2022-10-14 DIAGNOSIS — E139 Other specified diabetes mellitus without complications: Secondary | ICD-10-CM | POA: Insufficient documentation

## 2022-10-14 DIAGNOSIS — Z8546 Personal history of malignant neoplasm of prostate: Secondary | ICD-10-CM | POA: Diagnosis not present

## 2022-10-14 DIAGNOSIS — R918 Other nonspecific abnormal finding of lung field: Secondary | ICD-10-CM | POA: Insufficient documentation

## 2022-10-14 LAB — HEMOGLOBIN A1C
Hgb A1c MFr Bld: 6.7 % — ABNORMAL HIGH (ref 4.8–5.6)
Mean Plasma Glucose: 145.59 mg/dL

## 2022-10-14 LAB — CBC WITH DIFFERENTIAL/PLATELET
Abs Immature Granulocytes: 0.01 10*3/uL (ref 0.00–0.07)
Basophils Absolute: 0 10*3/uL (ref 0.0–0.1)
Basophils Relative: 1 %
Eosinophils Absolute: 0.1 10*3/uL (ref 0.0–0.5)
Eosinophils Relative: 2 %
HCT: 41.1 % (ref 39.0–52.0)
Hemoglobin: 13.8 g/dL (ref 13.0–17.0)
Immature Granulocytes: 0 %
Lymphocytes Relative: 41 %
Lymphs Abs: 2.5 10*3/uL (ref 0.7–4.0)
MCH: 31.4 pg (ref 26.0–34.0)
MCHC: 33.6 g/dL (ref 30.0–36.0)
MCV: 93.6 fL (ref 80.0–100.0)
Monocytes Absolute: 0.7 10*3/uL (ref 0.1–1.0)
Monocytes Relative: 12 %
Neutro Abs: 2.7 10*3/uL (ref 1.7–7.7)
Neutrophils Relative %: 44 %
Platelets: 252 10*3/uL (ref 150–400)
RBC: 4.39 MIL/uL (ref 4.22–5.81)
RDW: 14.1 % (ref 11.5–15.5)
WBC: 6 10*3/uL (ref 4.0–10.5)
nRBC: 0 % (ref 0.0–0.2)

## 2022-10-14 LAB — COMPREHENSIVE METABOLIC PANEL
ALT: 28 U/L (ref 0–44)
AST: 30 U/L (ref 15–41)
Albumin: 3.9 g/dL (ref 3.5–5.0)
Alkaline Phosphatase: 54 U/L (ref 38–126)
Anion gap: 7 (ref 5–15)
BUN: 23 mg/dL (ref 8–23)
CO2: 28 mmol/L (ref 22–32)
Calcium: 8.8 mg/dL — ABNORMAL LOW (ref 8.9–10.3)
Chloride: 101 mmol/L (ref 98–111)
Creatinine, Ser: 0.85 mg/dL (ref 0.61–1.24)
GFR, Estimated: >60 mL/min (ref >60)
Glucose, Bld: 98 mg/dL (ref 70–99)
Potassium: 3.9 mmol/L (ref 3.5–5.1)
Sodium: 136 mmol/L (ref 135–145)
Total Bilirubin: 0.6 mg/dL (ref 0.3–1.2)
Total Protein: 7 g/dL (ref 6.5–8.1)

## 2022-10-14 NOTE — Progress Notes (Signed)
Hematology/Oncology Consult note [facility]  Telephone:([phone] Fax:([phone]  Patient Care Team: [name], MD as PCP - General (Internal Medicine) [name], RN as Oncology Nurse Navigator   Name of the patient: George Owens  [id number]  [birth year]   Date of visit: 10/14/22  Diagnosis-history of pancreatic cancer s/p resection now concerning for lung metastases  Chief complaint/ Reason for visit-discuss CT scan results and further management  Heme/Onc history: patient is a 89 year old male with a past medical history significant for GERD, hyperlipidemia, cirrhosis.  He was having symptoms of Abdominal pain and reflux and therefore underwent a CT abdomen and pelvis with contrast on 09/11/2020 which showed a hypointense ill-defined mass in the pancreatic body up to 25 mm.  No extrapancreatic infiltrative density about the aorta or proximal [name].  No local regional adenopathy or distant metastatic disease.  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  This was followed by an EUS at [facility] which showed a hypoechoic mass measuring 1.8 x 1.6 cm in the pancreatic body.  Endosonographic borders poorly defined.  Upstream pancreatic duct dilatation.  No abnormal appearing lymph nodes.  Endosonographic imaging of the liver showed no lesion.  Biopsy showed adenocarcinoma.   PET scan showed 2.7 x 2 cm ovoid mass in the body of the pancreas with an SUV of 7.1.  18 mm hypermetabolic nodule in the isthmus of the thyroid gland.  No evidence of local regional adenopathy or distant metastatic disease.   Patient was evaluated by Dr. [name] at [facility] for consideration of surgery and has been deemed to be upfront resectable surgical candidate.  However plan was to offer him neoadjuvant chemotherapy for 3 months to control micrometastatic disease before proceeding with definitive surgery.  If ultimately patient decides not to proceed with surgery then radiation at [facility]  also remains an option.   Patient received 8 doses of gemcitabine and Abraxane chemotherapy 1 week on 1 week off due to neutropenia and thrombocytopenia. Patient underwent laparoscopy surgery by Dr. [name] at [facility].  Final pathology showed 3 cm grade 3 poorly differentiated adenocarcinoma of the pancreas with negative margins.  15 lymph nodes negative for malignancy.  Absent chemotherapy response with extensive residual cancer and no evidence of tumor regression.  Perineural invasion identified lymphovascular invasion not identified.  Patient completed 4 months of adjuvant gemcitabine Abraxane chemotherapy in January 2023.   Interval history-patient is doing well for his age.  He walks about a mile a mile -1.5 miles per day.   ECOG PS- 1 Pain scale- 0   Review of systems- Review of Systems  Constitutional:  Negative for chills, fever, malaise/fatigue and weight loss.  HENT:  Negative for congestion, ear discharge and nosebleeds.   Eyes:  Negative for blurred vision.  Respiratory:  Negative for cough, hemoptysis, sputum production, shortness of breath and wheezing.   Cardiovascular:  Negative for chest pain, palpitations, orthopnea and claudication.  Gastrointestinal:  Negative for abdominal pain, blood in stool, constipation, diarrhea, heartburn, melena, nausea and vomiting.  Genitourinary:  Negative for dysuria, flank pain, frequency, hematuria and urgency.  Musculoskeletal:  Negative for back pain, joint pain and myalgias.  Skin:  Negative for rash.  Neurological:  Negative for dizziness, tingling, focal weakness, seizures, weakness and headaches.  Endo/Heme/Allergies:  Does not bruise/bleed easily.  Psychiatric/Behavioral:  Negative for depression and suicidal ideas. The patient does not have insomnia.       Allergies  Allergen Reactions   Kenalog [Triamcinolone Acetonide]  Blindness X 3 Days injection     Past Medical History:  Diagnosis Date   Allergy    Aneurysm of  abdominal aorta (HCC)    Basal cell carcinoma    right temple 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     Past Surgical History:  Procedure Laterality Date   CATARACT EXTRACTION Right    CATARACT EXTRACTION W/PHACO Left 12/26/2017   Procedure: CATARACT EXTRACTION PHACO AND INTRAOCULAR LENS PLACEMENT (IOC)  COMPLICATED LEFT;  Surgeon: Lockie Mola, MD;  Location: [facility];  Service: Ophthalmology;  Laterality: Left;  MALYUGIN VISION BLUE   colonoscopy     ESOPHAGOGASTRODUODENOSCOPY (EGD) WITH PROPOFOL     EYE MUSCLE SURGERY Left    IR KYPHO THORACIC WITH BONE BIOPSY  11/11/2021   PORTA CATH INSERTION N/A 10/19/2020   Procedure: PORTA CATH INSERTION;  Surgeon: Annice Needy, MD;  Location: [facility] INVASIVE CV LAB;  Service: Cardiovascular;  Laterality: N/A;   TONSILLECTOMY AND ADENOIDECTOMY      Social History   Socioeconomic History   Marital status: Married    Spouse name: Eloise   Number of children: 4   Years of education: Not on file   Highest education level: Not on file  Occupational History   Occupation: retired  Tobacco Use   Smoking status: Never   Smokeless tobacco: Never  Vaping Use   Vaping Use: Never used  Substance and Sexual Activity   Alcohol use: No   Drug use: No   Sexual activity: Yes    Partners: Female  Other Topics Concern   Not on file  Social History Narrative   Lives with wife in a 5 story retirement home.  He lives on the first floor.  4 year college degree.  Retired Corporate investment banker.  Married 4 years to his first wife's sister.     Social Determinants of Health   Financial Resource Strain: Low Risk  (04/13/2020)   Overall Financial Resource Strain  (CARDIA)    Difficulty of Paying Living Expenses: Not hard at all  Food Insecurity: No Food Insecurity (04/10/2019)   Hunger Vital Sign    Worried About Running Out of Food in the Last Year: Never true    Ran Out of Food in the Last Year: Never true  Transportation Needs: No Transportation Needs (04/13/2020)   PRAPARE - Administrator, Civil Service (Medical): No    Lack of Transportation (Non-Medical): No  Physical Activity: Sufficiently Active (04/10/2019)   Exercise Vital Sign    Days of Exercise per Week: 5 days    Minutes of Exercise per Session: 30 min  Stress: No Stress Concern Present (04/13/2020)   [employer] of Occupational Health - Occupational Stress Questionnaire    Feeling of Stress : Not at all  Social Connections: Unknown (04/13/2020)   Social Connection and Isolation Panel [NHANES]    Frequency of Communication with Friends and Family: Not on file    Frequency of Social Gatherings with Friends and Family: Not on file    Attends Religious Services: Not on file    Active Member of Clubs or Organizations: Not on  file    Attends Club or Organization Meetings: Not on file    Marital Status: Married  Intimate Partner Violence: Not At Risk (04/10/2019)   Humiliation, Afraid, Rape, and Kick questionnaire    Fear of Current or Ex-Partner: No    Emotionally Abused: No    Physically Abused: No    Sexually Abused: No    Family History  Problem Relation Age of Onset   Stroke Mother    Arthritis Father    Heart disease Father    Brain cancer Brother        1/2 brother (possibly related to mother NOT related to patient)   Cancer Cousin        unk type   Lymphoma Cousin        vs leukemia   Colon cancer Neg Hx    Esophageal cancer Neg Hx    Pancreatic cancer Neg Hx    Stomach cancer Neg Hx    Liver disease Neg Hx      Current Outpatient Medications:    acetaminophen (TYLENOL) 500 MG tablet, Take 1,000 mg by mouth every 6 (six) hours as needed  for moderate pain., Disp: , Rfl:    Blood Glucose Monitoring Suppl (ONETOUCH VERIO) w/Device KIT, Please use to check blood sugars once daily. Dx code: E11.65, Disp: 1 kit, Rfl: 0   Blood Glucose Monitoring Suppl DEVI, 1 each by Does not apply route daily. May substitute to any manufacturer covered by patient's insurance., Disp: 1 each, Rfl: 0   Calcium Carb-Cholecalciferol (CALCIUM 600 + D PO), Take 1 tablet by mouth daily., Disp: , Rfl:    cholecalciferol (VITAMIN D3) 25 MCG (1000 UNIT) tablet, Take 1,000 Units by mouth daily., Disp: , Rfl:    Cranberry 500 MG CAPS, Take 500 mg by mouth daily., Disp: , Rfl:    denosumab (PROLIA) 60 MG/ML SOSY injection, Inject 60 mg into the skin every 6 (six) months., Disp: 1 mL, Rfl: 1   Glucose Blood (BLOOD GLUCOSE TEST STRIPS) STRP, May substitute to any manufacturer covered by patient's insurance.  Use to  check blood sugar once daily at variable times, Disp: 100 strip, Rfl: 0   glucose blood (ONETOUCH VERIO) test strip, Use to check blood sugars one time daily. Dx code: E11.65, Disp: 100 each, Rfl: 2   hydrocortisone (ANUSOL-HC) 2.5 % rectal cream, Place 1 application rectally 2 (two) times daily., Disp: 30 g, Rfl: 0   Lancets (ONETOUCH ULTRASOFT) lancets, Use to check blood sugars once daily. Dx code: E11.65, Disp: 100 each, Rfl: 2   loratadine (CLARITIN) 10 MG tablet, Take 10 mg by mouth daily as needed for allergies., Disp: , Rfl:    metoprolol succinate (TOPROL-XL) 50 MG 24 hr tablet, Take 1.5 tablets (75 mg total) by mouth daily. (Patient taking differently: Take 25 mg by mouth daily.), Disp: 30 tablet, Rfl: 1   omeprazole (PRILOSEC) 40 MG capsule, TAKE 1 CAPSULE (40 MG TOTAL) BY MOUTH DAILY., Disp: 90 capsule, Rfl: 3   Polyethyl Glycol-Propyl Glycol (SYSTANE OP), Place 1 drop into both eyes daily., Disp: , Rfl:    Ranibizumab (LUCENTIS IO), Place 1 Dose into the right eye See admin instructions. Every 14 weeks, Disp: , Rfl:    tamsulosin (FLOMAX) 0.4 MG  CAPS capsule, TAKE 1 CAPSULE BY MOUTH EVERY DAY AFTER SUPPER, Disp: 90 capsule, Rfl: 1   UNABLE TO FIND, Med Name: Relaxium to help with sleep, Disp: , Rfl:    vitamin B-12 (CYANOCOBALAMIN) 1000 MCG  tablet, Take 1,000 mcg by mouth daily., Disp: , Rfl:  No current facility-administered medications for this visit.  Facility-Administered Medications Ordered in Other Visits:    heparin lock flush 100 UNIT/ML injection, , , ,    heparin lock flush 100 unit/mL, 500 Units, Intravenous, Once, [name], MD  Physical exam:  Vitals:   10/14/22 1110  BP: 114/71  Pulse: 62  Resp: 16  Temp: (!) 96.6 F (35.9 C)  TempSrc: Tympanic  SpO2: 100%  Weight: 184 lb 4.8 oz (83.6 kg)  Height: 6\' 4"  (1.93 m)   Physical Exam Cardiovascular:     Rate and Rhythm: Normal rate and regular rhythm.     Heart sounds: Normal heart sounds.  Pulmonary:     Effort: Pulmonary effort is normal.  Skin:    General: Skin is warm and dry.  Neurological:     Mental Status: He is alert and oriented to person, place, and time.         Latest Ref Rng & Units 10/14/2022   10:56 AM  CMP  Glucose 70 - 99 mg/dL 98   BUN 8 - 23 mg/dL 23   Creatinine 9.56 - 1.24 mg/dL 2.13   Sodium 086 - 578 mmol/L 136   Potassium 3.5 - 5.1 mmol/L 3.9   Chloride 98 - 111 mmol/L 101   CO2 22 - 32 mmol/L 28   Calcium 8.9 - 10.3 mg/dL 8.8   Total Protein 6.5 - 8.1 g/dL 7.0   Total Bilirubin 0.3 - 1.2 mg/dL 0.6   Alkaline Phos 38 - 126 U/L 54   AST 15 - 41 U/L 30   ALT 0 - 44 U/L 28       Latest Ref Rng & Units 10/14/2022   10:56 AM  CBC  WBC 4.0 - 10.5 K/uL 6.0   Hemoglobin 13.0 - 17.0 g/dL 46.9   Hematocrit 62.9 - 52.0 % 41.1   Platelets 150 - 400 K/uL 252     No images are attached to the encounter.  CT CHEST ABDOMEN PELVIS W CONTRAST  Result Date: 10/10/2022 CLINICAL DATA:  Status post resection of pancreatic mass in 2022. Finished chemotherapy in January of 2023. Prostate cancer with radiation therapy 18 years  ago. * Tracking Code: BO * EXAM: CT CHEST, ABDOMEN, AND PELVIS WITH CONTRAST TECHNIQUE: Multidetector CT imaging of the chest, abdomen and pelvis was performed following the standard protocol during bolus administration of intravenous contrast. RADIATION DOSE REDUCTION: This exam was performed according to the departmental dose-optimization program which includes automated exposure control, adjustment of the mA and/or kV according to patient size and/or use of iterative reconstruction technique. CONTRAST:  OMNIPAQUE IOHEXOL 300 MG/ML  SOLN COMPARISON:  05/31/2022 and PET.  02/25/2022 CTs. FINDINGS: CT CHEST FINDINGS Cardiovascular: Right Port-A-Cath tip high right atrium. Aortic atherosclerosis. Ascending aortic dilatation at 4.5 cm and 32/2, similar. Tortuous thoracic aorta. Mild cardiomegaly, without pericardial effusion. Right coronary artery calcification. No central pulmonary embolism, on this non-dedicated study. Mediastinum/Nodes: No supraclavicular adenopathy. Thyroid isthmic nodule is similar 2.1 cm and of doubtful clinical significance given comorbidities. No mediastinal or hilar adenopathy. Lungs/Pleura: No pleural fluid. Biapical pleuroparenchymal scarring. Bilateral pulmonary nodules. Index right lower lobe nodule measures 11 by 11 mm on 151/3 versus 8 x 8 mm on prior CT when remeasured in a similar fashion. Just cephalad to this, a right lower lobe 1.0 cm nodule on 143/3 is enlarged from 8 mm. Index left upper lobe pulmonary nodule anteriorly measures  1.0 cm and 32/3 versus 8 mm on the prior. Index left lower lobe pulmonary nodule measures 1.0 cm 109/3 versus 6 mm on the prior. Musculoskeletal: Vertebral augmentation secondary to a moderate to severe compression deformity at T9. CT ABDOMEN PELVIS FINDINGS Hepatobiliary: Hepatic steatosis without suspicious liver lesion or biliary abnormality. Pancreas: Distal pancreatectomy. Normal appearance of the remaining pancreatic head and neck. Spleen:  Splenectomy. Adrenals/Urinary Tract: Normal adrenal glands. Normal right kidney. Lower pole left renal exophytic 11 mm cyst. No hydronephrosis. Normal urinary bladder. Stomach/Bowel: Proximal gastric underdistention. Scattered colonic diverticula. Normal terminal ileum and appendix. Normal small bowel. Vascular/Lymphatic: Aortic atherosclerosis. Right renal artery aneurysm of 1.4 cm on 73/2 is unchanged. Patent portal and superior mesenteric veins. No abdominopelvic adenopathy. Reproductive: Radiation seeds in the prostate. Other: No significant free fluid. No evidence of omental or peritoneal disease. Musculoskeletal: Similar right proximal femur sclerotic lesion of 1.7 cm on 130/2. Moderate superior endplate compression deformity at L2 is unchanged. Lumbosacral spondylosis. Mild convex left lumbar spine curvature. IMPRESSION: 1. Since 02/25/2022, progression of pulmonary metastasis. 2. Status post distal pancreatectomy, without local recurrence or abdominopelvic metastasis. 3. Thoracolumbar compression deformities, with T9 vertebral augmentation. 4. Right renal artery aneurysm, similar. 5. Ascending thoracic aortic aneurysm of 4.5 cm, similar. Recommend attention on follow-up. 6. Right femoral neck sclerotic lesion is unchanged back to 09/11/2020, favoring a benign entity such as an enchondroma. 7. Hepatic steatosis 8. Coronary artery atherosclerosis. Aortic Atherosclerosis (ICD10-I70.0). Electronically Signed   By: Jeronimo Greaves M.D.   On: 10/10/2022 13:29     Assessment and plan- Patient is a 89 y.o. male  with history of stage IB pancreatic cancer s/p neoadjuvant chemotherapy and surgery.  He is here to discuss CT scan results and further management  I have reviewed CT chest abdomen plevis was images independently and discussed findings with the patient.  Patient was found to have bilateral lower lobe nodules which have been present since September 2023 and have been growing gradually by a few millimeters.   Presently CT scan shows up to 1 mm nodules predominantly present in the right lower lobe.  There was also a left lower lobe nodule measuring 8 mm and a left upper lobe nodule measuring 1 cm.  These have all grown by a few millimeters over the last 6 to 7 months.  I did discuss his case with Dr. Fredia Sorrow from interventional radiology.  They are unable to offer a CT-guided biopsy of the lower lobe nodules given the risk of the procedure and movement at the time of biopsy from respiration.  I therefore did get in touch with Dr. Aundria Rud from pulmonary who will be happy to see the patient for consideration of bronchoscopy.  If biopsy is diagnostic and conclusive for pancreatic cancer I will see him to discuss further management.  At the age of 70 patient is leaning towards best supportive care and not active chemotherapy even if this were to be pancreatic cancer.  If biopsy is nondiagnostic I will consider getting a repeat CT scan again in 6 months since overall these nodules have been growing very slowly.  Patient remains asymptomatic at this time and has a good quality of life patient and his wife comprehend my plan   Visit Diagnosis 1. Encounter for follow-up surveillance of pancreatic cancer   2. Lung nodules      Dr. Owens Shark, MD, MPH [facility] [phone] 10/14/2022 1:05 PM

## 2022-10-14 NOTE — Telephone Encounter (Signed)
Pt. Called to say that he just ad port flush so for today he just needs labs from vein and no from the port. We cancelled the port flush, and go the lab today for labs. I called back and spoke to wife and she will tell the pt to come at 11 am and get labs down in the lab today

## 2022-10-14 NOTE — Progress Notes (Signed)
No concerns. 

## 2022-10-16 LAB — CANCER ANTIGEN 19-9: CA 19-9: 11 U/mL (ref 0–35)

## 2022-10-17 ENCOUNTER — Ambulatory Visit: Payer: PPO | Admitting: Student in an Organized Health Care Education/Training Program

## 2022-10-17 ENCOUNTER — Encounter: Payer: Self-pay | Admitting: Student in an Organized Health Care Education/Training Program

## 2022-10-17 VITALS — BP 110/80 | HR 57 | Temp 97.5°F | Ht 75.0 in | Wt 183.0 lb

## 2022-10-17 DIAGNOSIS — C251 Malignant neoplasm of body of pancreas: Secondary | ICD-10-CM | POA: Diagnosis not present

## 2022-10-17 DIAGNOSIS — R911 Solitary pulmonary nodule: Secondary | ICD-10-CM | POA: Diagnosis not present

## 2022-10-17 NOTE — Progress Notes (Unsigned)
Synopsis: Referred in for pulmonary nodule by [name], MD  Assessment & Plan:   1. 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 Pulmonary nodule 1 cm or greater in diameter  Nodule Location: Multiple, index in RLL Nodule Size: 11 mm Nodule Spiculation: Yes Associated Lymphadenopathy: No Smoking Status (never) Extrathoracic cancer > 5 years prior (yes): pancreatic cancer, s/p resection 2022 ECOG: 1  The patient is here to discuss their imaging abnormalities which include multiple pulmonary nodules that include index nodules in the RLL and LLL. His nodules, especially the one in the RLL which is most concerning, had not been present on imaging in 2021, appeared in 2022, and have been growing on multiple repeat imaging studies. During today's visit, we went over said images with patient and his wife, and I explained to them the differential that could include metastatic malignancy (pancreatic cancer) vs primary lung cancer vs infections/inflammatory etiology (least likely).   He is referred to [place] for consideration of biopsy to confirm the diagnosis. I explained to the patient that the risk robotic assisted navigation is generally < 2% (bleeding, PTX etc...). I also explained to him that there's a non-trivial risk with anesthesia itself, especially given his age of 89 years. I am mostly concerned regarding risk of ACS, stroke, as well as functional decline from anesthesia. We extrapolated the risks from the [place] [employer] risk calculator, with most of his risk being that of functional decline (around 25%). We also explored what he would do with information from the biopsy, and whether he would be willing to undergo chemotherapy.  I feel confident that after today's visit that [name] understands the risks and benefits of any procedure under general anesthesia. He would like to discuss his therapy options further with Dr. Smith Robert from oncology before deciding on biopsy. I  did tell [name] that we could schedule him for a procedure to secure a time, and if he decides against it, we could always cancel it. He was in agreement.  We discussed the importance of diagnosis and staging in lung malignancies, and the approach to obtaining a tissue diagnosis which would include robotic assisted navigational bronchoscopy with endobronchial ultrasound guided sampling.  We also discussed the risks associated with the procedure which include a 2% risk of pneumothorax, infection, bleeding, and nondiagnostic procedure in detail.  I explained that patients typically are able to return home the same day of the procedure, but in rare cases admission to the hospital for observation and treatment is required.  Recommendations: Consideration for robotic assisted navigational bronchoscopy with EBUS.  No follow-ups on file.  I spent 60 minutes caring for this patient today, including preparing to see the patient, obtaining a medical history , reviewing a separately obtained history, performing a medically appropriate examination and/or evaluation, counseling and educating the patient/family/caregiver, referring and communicating with other health care professionals (not separately reported), and documenting clinical information in the electronic health record  Raechel Chute, MD [facility] 10/17/2022 5:39 PM    End of visit medications:  No orders of the defined types were placed in this encounter.    Current Outpatient Medications:    acetaminophen (TYLENOL) 500 MG tablet, Take 1,000 mg by mouth every 6 (six) hours as needed for moderate pain., Disp: , Rfl:    Blood Glucose Monitoring Suppl (ONETOUCH VERIO) w/Device KIT, Please use to check blood sugars once daily. Dx code: E11.65, Disp: 1 kit, Rfl: 0   Blood Glucose  Monitoring Suppl DEVI, 1 each by Does not apply route daily. May substitute to any manufacturer covered by patient's insurance., Disp: 1 each,  Rfl: 0   Calcium Carb-Cholecalciferol (CALCIUM 600 + D PO), Take 1 tablet by mouth daily., Disp: , Rfl:    cholecalciferol (VITAMIN D3) 25 MCG (1000 UNIT) tablet, Take 1,000 Units by mouth daily., Disp: , Rfl:    Cranberry 500 MG CAPS, Take 500 mg by mouth daily., Disp: , Rfl:    denosumab (PROLIA) 60 MG/ML SOSY injection, Inject 60 mg into the skin every 6 (six) months., Disp: 1 mL, Rfl: 1   Glucose Blood (BLOOD GLUCOSE TEST STRIPS) STRP, May substitute to any manufacturer covered by patient's insurance.  Use to  check blood sugar once daily at variable times, Disp: 100 strip, Rfl: 0   glucose blood (ONETOUCH VERIO) test strip, Use to check blood sugars one time daily. Dx code: E11.65, Disp: 100 each, Rfl: 2   hydrocortisone (ANUSOL-HC) 2.5 % rectal cream, Place 1 application rectally 2 (two) times daily., Disp: 30 g, Rfl: 0   Lancets (ONETOUCH ULTRASOFT) lancets, Use to check blood sugars once daily. Dx code: E11.65, Disp: 100 each, Rfl: 2   loratadine (CLARITIN) 10 MG tablet, Take 10 mg by mouth daily as needed for allergies., Disp: , Rfl:    metoprolol succinate (TOPROL-XL) 50 MG 24 hr tablet, Take 1.5 tablets (75 mg total) by mouth daily. (Patient taking differently: Take 25 mg by mouth daily.), Disp: 30 tablet, Rfl: 1   omeprazole (PRILOSEC) 40 MG capsule, TAKE 1 CAPSULE (40 MG TOTAL) BY MOUTH DAILY., Disp: 90 capsule, Rfl: 3   Polyethyl Glycol-Propyl Glycol (SYSTANE OP), Place 1 drop into both eyes daily., Disp: , Rfl:    Ranibizumab (LUCENTIS IO), Place 1 Dose into the right eye See admin instructions. Every 14 weeks, Disp: , Rfl:    tamsulosin (FLOMAX) 0.4 MG CAPS capsule, TAKE 1 CAPSULE BY MOUTH EVERY DAY AFTER SUPPER, Disp: 90 capsule, Rfl: 1   vitamin B-12 (CYANOCOBALAMIN) 1000 MCG tablet, Take 1,000 mcg by mouth daily., Disp: , Rfl:  No current facility-administered medications for this visit.  Facility-Administered Medications Ordered in Other Visits:    heparin lock flush 100  UNIT/ML injection, , , ,    heparin lock flush 100 unit/mL, 500 Units, Intravenous, Once, [name], MD   Subjective:   PATIENT ID: George Owens GENDER: male DOB: 01/20/1932, MRN: [id number]  Chief Complaint  Patient presents with   Consult    Lung nodules, wants to know what the risk are with having them biopsied so he can decide if he wants to have it done.  Hx of pancreatic surgery with surgery (02/2021).    HPI  Patient is a pleasant 89 year old male presenting in the company of his wife for the evaluation of pulmonary nodules.  Patient is in his usual state of health. He has no respiratory symptoms. He has no cough, no chest pain, and no chest tightness. There is no report of sputum production or hemoptysis. He has not had any fevers or chills. He is overall healthy and is able to ambulate and be active, with no limitation.  He does have a history of pancreatic cancer, diagnosed two years ago, and is s/p laparoscopic resection of the pancreatic tail at [facility]. His symptoms initially consisted of abodminal pain, with an abdominal CT in March of 2022 showing a mass in the pancreatic body that was PET avid. He received  neoadjuvant chemotherapy for three months followed by laparoscopic resection, with pathology showing poorly differentiated adenocarcinoma of the pancreas with negative margins. He then completed 4 months of adjuvant chemotherapy.  Patient has been followed closely by Dr. Smith Robert from oncology. He's had multiple surveillance imaging studies to evaluate for any recurrence or metastasis. His most recent imaging is showing bilateral pulmonary nodules that have grown on repeat imaging. He is referred to [place] for consideration of biopsy.  Chest CT 10/10/2022  Bilateral pulmonary nodules. Index right lower lobe nodule measures 11 by 11 mm on 151/3 versus 8 x 8 mm on prior CT when remeasured in a similar fashion. Just cephalad to this, a right lower lobe 1.0 cm nodule on  143/3 is enlarged from 8 mm. Index left upper lobe pulmonary nodule anteriorly measures 1.0 cm and 32/3 versus 8 mm on the prior. Index left lower lobe pulmonary nodule measures 1.0 cm 109/3 versus 6 mm on the prior.  Patient has no smoking history, and is overall very active.  Ancillary information including prior medications, full medical/surgical/family/social histories, and PFTs (when available) are listed below and have been reviewed.   Review of Systems  Constitutional:  Negative for chills, fever and weight loss.  Respiratory:  Negative for cough, hemoptysis, sputum production, shortness of breath and wheezing.   Cardiovascular:  Negative for chest pain.     Objective:   Vitals:   10/17/22 1627  BP: 110/80  Pulse: (!) 57  Temp: (!) 97.5 F (36.4 C)  TempSrc: Oral  SpO2: 97%  Weight: 183 lb (83 kg)  Height: 6\' 3"  (1.905 m)   97% on RA BMI Readings from Last 3 Encounters:  10/17/22 22.87 kg/m  10/14/22 22.43 kg/m  08/23/22 22.77 kg/m   Wt Readings from Last 3 Encounters:  10/17/22 183 lb (83 kg)  10/14/22 184 lb 4.8 oz (83.6 kg)  08/23/22 182 lb 3.2 oz (82.6 kg)    Physical Exam Constitutional:      Appearance: Normal appearance. He is not ill-appearing.  HENT:     Head: Normocephalic.     Nose: Nose normal.  Cardiovascular:     Rate and Rhythm: Normal rate and regular rhythm.     Pulses: Normal pulses.     Heart sounds: Normal heart sounds.  Pulmonary:     Effort: Pulmonary effort is normal.     Breath sounds: Normal breath sounds.  Musculoskeletal:     Right lower leg: No edema.     Left lower leg: No edema.  Neurological:     General: No focal deficit present.     Mental Status: He is alert and oriented to person, place, and time. Mental status is at baseline.       Ancillary Information    Past Medical History:  Diagnosis Date   Allergy    Aneurysm of abdominal aorta (HCC)    Basal cell carcinoma    right temple 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     Family History  Problem Relation Age of Onset   Stroke Mother    Arthritis Father    Heart disease Father  Brain cancer Brother        1/2 brother (possibly related to mother NOT related to patient)   Cancer Cousin        unk type   Lymphoma Cousin        vs leukemia   Colon cancer Neg Hx    Esophageal cancer Neg Hx    Pancreatic cancer Neg Hx    Stomach cancer Neg Hx    Liver disease Neg Hx      Past Surgical History:  Procedure Laterality Date   CATARACT EXTRACTION Right    CATARACT EXTRACTION W/PHACO Left 12/26/2017   Procedure: CATARACT EXTRACTION PHACO AND INTRAOCULAR LENS PLACEMENT (IOC)  COMPLICATED LEFT;  Surgeon: Lockie Mola, MD;  Location: [facility];  Service: Ophthalmology;  Laterality: Left;  MALYUGIN VISION BLUE   colonoscopy     ESOPHAGOGASTRODUODENOSCOPY (EGD) WITH PROPOFOL     EYE MUSCLE SURGERY Left    IR KYPHO THORACIC WITH BONE BIOPSY  11/11/2021   PORTA CATH INSERTION N/A 10/19/2020   Procedure: PORTA CATH INSERTION;  Surgeon: Annice Needy, MD;  Location: [facility];  Service: Cardiovascular;  Laterality: N/A;   TONSILLECTOMY AND ADENOIDECTOMY      Social History   Socioeconomic History   Marital status: Married    Spouse name: Eloise   Number of children: 4   Years of education: Not on file   Highest education level: Not on file  Occupational History   Occupation: retired  Tobacco Use   Smoking status: Never   Smokeless tobacco: Never  Vaping Use   Vaping Use: Never used  Substance and Sexual Activity   Alcohol use: No   Drug use: No   Sexual activity: Yes    Partners: Female  Other Topics Concern   Not  on file  Social History Narrative   Lives with wife in a 5 story retirement home.  He lives on the first floor.  4 year college degree.  Retired Corporate investment banker.  Married 4 years to his first wife's sister.     Social Determinants of Health   Financial Resource Strain: Low Risk  (04/13/2020)   Overall Financial Resource Strain (CARDIA)    Difficulty of Paying Living Expenses: Not hard at all  Food Insecurity: No Food Insecurity (04/10/2019)   Hunger Vital Sign    Worried About Running Out of Food in the Last Year: Never true    Ran Out of Food in the Last Year: Never true  Transportation Needs: No Transportation Needs (04/13/2020)   PRAPARE - Administrator, Civil Service (Medical): No    Lack of Transportation (Non-Medical): No  Physical Activity: Sufficiently Active (04/10/2019)   Exercise Vital Sign    Days of Exercise per Week: 5 days    Minutes of Exercise per Session: 30 min  Stress: No Stress Concern Present (04/13/2020)   [employer] of Occupational Health - Occupational Stress Questionnaire    Feeling of Stress : Not at all  Social Connections: Unknown (04/13/2020)   Social Connection and Isolation Panel [NHANES]    Frequency of Communication with Friends and Family: Not on file    Frequency of Social Gatherings with Friends and Family: Not on file    Attends Religious Services: Not on file    Active Member of Clubs or Organizations: Not on file    Attends Banker Meetings: Not on file    Marital Status: Married  Intimate Partner Violence: Not At Risk (  04/10/2019)   Humiliation, Afraid, Rape, and Kick questionnaire    Fear of Current or Ex-Partner: No    Emotionally Abused: No    Physically Abused: No    Sexually Abused: No     Allergies  Allergen Reactions   Kenalog [Triamcinolone Acetonide]     Blindness X 3 Days injection     CBC    Component Value Date/Time   WBC 6.0 10/14/2022 1056   RBC 4.39 10/14/2022 1056   HGB 13.8  10/14/2022 1056   HGB 13.6 03/24/2021 1406   HCT 41.1 10/14/2022 1056   HCT 41.4 03/24/2021 1406   PLT 252 10/14/2022 1056   PLT 324 03/24/2021 1406   MCV 93.6 10/14/2022 1056   MCV 93 03/24/2021 1406   MCV 91 05/29/2014 0917   MCH 31.4 10/14/2022 1056   MCHC 33.6 10/14/2022 1056   RDW 14.1 10/14/2022 1056   RDW 12.4 03/24/2021 1406   RDW 13.4 05/29/2014 0917   LYMPHSABS 2.5 10/14/2022 1056   LYMPHSABS 1.2 05/29/2014 0917   MONOABS 0.7 10/14/2022 1056   MONOABS 0.3 05/29/2014 0917   EOSABS 0.1 10/14/2022 1056   EOSABS 0.1 05/29/2014 0917   BASOSABS 0.0 10/14/2022 1056   BASOSABS 0.0 05/29/2014 9147    Pulmonary Functions Testing Results:     No data to display          Outpatient Medications Prior to Visit  Medication Sig Dispense Refill   acetaminophen (TYLENOL) 500 MG tablet Take 1,000 mg by mouth every 6 (six) hours as needed for moderate pain.     Blood Glucose Monitoring Suppl (ONETOUCH VERIO) w/Device KIT Please use to check blood sugars once daily. Dx code: E11.65 1 kit 0   Blood Glucose Monitoring Suppl DEVI 1 each by Does not apply route daily. May substitute to any manufacturer covered by patient's insurance. 1 each 0   Calcium Carb-Cholecalciferol (CALCIUM 600 + D PO) Take 1 tablet by mouth daily.     cholecalciferol (VITAMIN D3) 25 MCG (1000 UNIT) tablet Take 1,000 Units by mouth daily.     Cranberry 500 MG CAPS Take 500 mg by mouth daily.     denosumab (PROLIA) 60 MG/ML SOSY injection Inject 60 mg into the skin every 6 (six) months. 1 mL 1   Glucose Blood (BLOOD GLUCOSE TEST STRIPS) STRP May substitute to any manufacturer covered by patient's insurance.  Use to  check blood sugar once daily at variable times 100 strip 0   glucose blood (ONETOUCH VERIO) test strip Use to check blood sugars one time daily. Dx code: E11.65 100 each 2   hydrocortisone (ANUSOL-HC) 2.5 % rectal cream Place 1 application rectally 2 (two) times daily. 30 g 0   Lancets (ONETOUCH  ULTRASOFT) lancets Use to check blood sugars once daily. Dx code: E11.65 100 each 2   loratadine (CLARITIN) 10 MG tablet Take 10 mg by mouth daily as needed for allergies.     metoprolol succinate (TOPROL-XL) 50 MG 24 hr tablet Take 1.5 tablets (75 mg total) by mouth daily. (Patient taking differently: Take 25 mg by mouth daily.) 30 tablet 1   omeprazole (PRILOSEC) 40 MG capsule TAKE 1 CAPSULE (40 MG TOTAL) BY MOUTH DAILY. 90 capsule 3   Polyethyl Glycol-Propyl Glycol (SYSTANE OP) Place 1 drop into both eyes daily.     Ranibizumab (LUCENTIS IO) Place 1 Dose into the right eye See admin instructions. Every 14 weeks     tamsulosin (FLOMAX) 0.4 MG CAPS capsule TAKE  1 CAPSULE BY MOUTH EVERY DAY AFTER SUPPER 90 capsule 1   vitamin B-12 (CYANOCOBALAMIN) 1000 MCG tablet Take 1,000 mcg by mouth daily.     UNABLE TO FIND Med Name: Relaxium to help with sleep (Patient not taking: Reported on 10/17/2022)     Facility-Administered Medications Prior to Visit  Medication Dose Route Frequency Provider Last Rate Last Admin   heparin lock flush 100 UNIT/ML injection            heparin lock flush 100 unit/mL  500 Units Intravenous Once [name], MD

## 2022-10-17 NOTE — H&P (View-Only) (Signed)
 Synopsis: Referred in for pulmonary nodule by Tullo, Teresa L, MD  Assessment & Plan:   1. 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 Pulmonary nodule 1 cm or greater in diameter  Nodule Location: Multiple, index in RLL Nodule Size: 11 mm Nodule Spiculation: Yes Associated Lymphadenopathy: No Smoking Status (never) Extrathoracic cancer > 5 years prior (yes): pancreatic cancer, s/p resection 2022 ECOG: 1  The patient is here to discuss their imaging abnormalities which include multiple pulmonary nodules that include index nodules in the RLL and LLL. His nodules, especially the one in the RLL which is most concerning, had not been present on imaging in 2021, appeared in 2022, and have been growing on multiple repeat imaging studies. During today's visit, we went over said images with patient and his wife, and I explained to them the differential that could include metastatic malignancy (pancreatic cancer) vs primary lung cancer vs infections/inflammatory etiology (least likely).   He is referred to us for consideration of biopsy to confirm the diagnosis. I explained to the patient that the risk robotic assisted navigation is generally < 2% (bleeding, PTX etc...). I also explained to him that there's a non-trivial risk with anesthesia itself, especially given his age of 89 years. I am mostly concerned regarding risk of ACS, stroke, as well as functional decline from anesthesia. We extrapolated the risks from the american college [employer] risk calculator, with most of his risk being that of functional decline (around 25%). We also explored what he would do with information from the biopsy, and whether he would be willing to undergo chemotherapy.  I feel confident that after today's visit that Mr. Pickford understands the risks and benefits of any procedure under general anesthesia. He would like to discuss his therapy options further with Dr. Rao from oncology before deciding on biopsy. I  did tell Mr. Auzenne that we could schedule him for a procedure to secure a time, and if he decides against it, we could always cancel it. He was in agreement.  We discussed the importance of diagnosis and staging in lung malignancies, and the approach to obtaining a tissue diagnosis which would include robotic assisted navigational bronchoscopy with endobronchial ultrasound guided sampling.  We also discussed the risks associated with the procedure which include a 2% risk of pneumothorax, infection, bleeding, and nondiagnostic procedure in detail.  I explained that patients typically are able to return home the same day of the procedure, but in rare cases admission to the hospital for observation and treatment is required.  Recommendations: Consideration for robotic assisted navigational bronchoscopy with EBUS.  No follow-ups on file.  I spent 60 minutes caring for this patient today, including preparing to see the patient, obtaining a medical history , reviewing a separately obtained history, performing a medically appropriate examination and/or evaluation, counseling and educating the patient/family/caregiver, referring and communicating with other health care professionals (not separately reported), and documenting clinical information in the electronic health record  Chealsea Paske, MD [facility] 10/17/2022 5:39 PM    End of visit medications:  No orders of the defined types were placed in this encounter.    Current Outpatient Medications:    acetaminophen (TYLENOL) 500 MG tablet, Take 1,000 mg by mouth every 6 (six) hours as needed for moderate pain., Disp: , Rfl:    Blood Glucose Monitoring Suppl (ONETOUCH VERIO) w/Device KIT, Please use to check blood sugars once daily. Dx code: E11.65, Disp: 1 kit, Rfl: 0   Blood Glucose   Monitoring Suppl DEVI, 1 each by Does not apply route daily. May substitute to any manufacturer covered by patient's insurance., Disp: 1 each,  Rfl: 0   Calcium Carb-Cholecalciferol (CALCIUM 600 + D PO), Take 1 tablet by mouth daily., Disp: , Rfl:    cholecalciferol (VITAMIN D3) 25 MCG (1000 UNIT) tablet, Take 1,000 Units by mouth daily., Disp: , Rfl:    Cranberry 500 MG CAPS, Take 500 mg by mouth daily., Disp: , Rfl:    denosumab (PROLIA) 60 MG/ML SOSY injection, Inject 60 mg into the skin every 6 (six) months., Disp: 1 mL, Rfl: 1   Glucose Blood (BLOOD GLUCOSE TEST STRIPS) STRP, May substitute to any manufacturer covered by patient's insurance.  Use to  check blood sugar once daily at variable times, Disp: 100 strip, Rfl: 0   glucose blood (ONETOUCH VERIO) test strip, Use to check blood sugars one time daily. Dx code: E11.65, Disp: 100 each, Rfl: 2   hydrocortisone (ANUSOL-HC) 2.5 % rectal cream, Place 1 application rectally 2 (two) times daily., Disp: 30 g, Rfl: 0   Lancets (ONETOUCH ULTRASOFT) lancets, Use to check blood sugars once daily. Dx code: E11.65, Disp: 100 each, Rfl: 2   loratadine (CLARITIN) 10 MG tablet, Take 10 mg by mouth daily as needed for allergies., Disp: , Rfl:    metoprolol succinate (TOPROL-XL) 50 MG 24 hr tablet, Take 1.5 tablets (75 mg total) by mouth daily. (Patient taking differently: Take 25 mg by mouth daily.), Disp: 30 tablet, Rfl: 1   omeprazole (PRILOSEC) 40 MG capsule, TAKE 1 CAPSULE (40 MG TOTAL) BY MOUTH DAILY., Disp: 90 capsule, Rfl: 3   Polyethyl Glycol-Propyl Glycol (SYSTANE OP), Place 1 drop into both eyes daily., Disp: , Rfl:    Ranibizumab (LUCENTIS IO), Place 1 Dose into the right eye See admin instructions. Every 14 weeks, Disp: , Rfl:    tamsulosin (FLOMAX) 0.4 MG CAPS capsule, TAKE 1 CAPSULE BY MOUTH EVERY DAY AFTER SUPPER, Disp: 90 capsule, Rfl: 1   vitamin B-12 (CYANOCOBALAMIN) 1000 MCG tablet, Take 1,000 mcg by mouth daily., Disp: , Rfl:  No current facility-administered medications for this visit.  Facility-Administered Medications Ordered in Other Visits:    heparin lock flush 100  UNIT/ML injection, , , ,    heparin lock flush 100 unit/mL, 500 Units, Intravenous, Once, Rao, Archana C, MD   Subjective:   PATIENT ID: George Owens GENDER: male DOB: 02/09/1932, MRN: [id number]  Chief Complaint  Patient presents with   Consult    Lung nodules, wants to know what the risk are with having them biopsied so he can decide if he wants to have it done.  Hx of pancreatic surgery with surgery (02/2021).    HPI  Patient is a pleasant 89 year old male presenting in the company of his wife for the evaluation of pulmonary nodules.  Patient is in his usual state of health. He has no respiratory symptoms. He has no cough, no chest pain, and no chest tightness. There is no report of sputum production or hemoptysis. He has not had any fevers or chills. He is overall healthy and is able to ambulate and be active, with no limitation.  He does have a history of pancreatic cancer, diagnosed two years ago, and is s/p laparoscopic resection of the pancreatic tail at [facility]. His symptoms initially consisted of abodminal pain, with an abdominal CT in March of 2022 showing a mass in the pancreatic body that was PET avid. He received   neoadjuvant chemotherapy for three months followed by laparoscopic resection, with pathology showing poorly differentiated adenocarcinoma of the pancreas with negative margins. He then completed 4 months of adjuvant chemotherapy.  Patient has been followed closely by Dr. Rao from oncology. He's had multiple surveillance imaging studies to evaluate for any recurrence or metastasis. His most recent imaging is showing bilateral pulmonary nodules that have grown on repeat imaging. He is referred to us for consideration of biopsy.  Chest CT 10/10/2022  Bilateral pulmonary nodules. Index right lower lobe nodule measures 11 by 11 mm on 151/3 versus 8 x 8 mm on prior CT when remeasured in a similar fashion. Just cephalad to this, a right lower lobe 1.0 cm nodule on  143/3 is enlarged from 8 mm. Index left upper lobe pulmonary nodule anteriorly measures 1.0 cm and 32/3 versus 8 mm on the prior. Index left lower lobe pulmonary nodule measures 1.0 cm 109/3 versus 6 mm on the prior.  Patient has no smoking history, and is overall very active.  Ancillary information including prior medications, full medical/surgical/family/social histories, and PFTs (when available) are listed below and have been reviewed.   Review of Systems  Constitutional:  Negative for chills, fever and weight loss.  Respiratory:  Negative for cough, hemoptysis, sputum production, shortness of breath and wheezing.   Cardiovascular:  Negative for chest pain.     Objective:   Vitals:   10/17/22 1627  BP: 110/80  Pulse: (!) 57  Temp: (!) 97.5 F (36.4 C)  TempSrc: Oral  SpO2: 97%  Weight: 183 lb (83 kg)  Height: 6' 3" (1.905 m)   97% on RA BMI Readings from Last 3 Encounters:  10/17/22 22.87 kg/m  10/14/22 22.43 kg/m  08/23/22 22.77 kg/m   Wt Readings from Last 3 Encounters:  10/17/22 183 lb (83 kg)  10/14/22 184 lb 4.8 oz (83.6 kg)  08/23/22 182 lb 3.2 oz (82.6 kg)    Physical Exam Constitutional:      Appearance: Normal appearance. He is not ill-appearing.  HENT:     Head: Normocephalic.     Nose: Nose normal.  Cardiovascular:     Rate and Rhythm: Normal rate and regular rhythm.     Pulses: Normal pulses.     Heart sounds: Normal heart sounds.  Pulmonary:     Effort: Pulmonary effort is normal.     Breath sounds: Normal breath sounds.  Musculoskeletal:     Right lower leg: No edema.     Left lower leg: No edema.  Neurological:     General: No focal deficit present.     Mental Status: He is alert and oriented to person, place, and time. Mental status is at baseline.       Ancillary Information    Past Medical History:  Diagnosis Date   Allergy    Aneurysm of abdominal aorta (HCC)    Basal cell carcinoma    right temple Dr. 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     Family History  Problem Relation Age of Onset   Stroke Mother    Arthritis Father    Heart disease Father      Brain cancer Brother        1/2 brother (possibly related to mother NOT related to patient)   Cancer Cousin        unk type   Lymphoma Cousin        vs leukemia   Colon cancer Neg Hx    Esophageal cancer Neg Hx    Pancreatic cancer Neg Hx    Stomach cancer Neg Hx    Liver disease Neg Hx      Past Surgical History:  Procedure Laterality Date   CATARACT EXTRACTION Right    CATARACT EXTRACTION W/PHACO Left 12/26/2017   Procedure: CATARACT EXTRACTION PHACO AND INTRAOCULAR LENS PLACEMENT (IOC)  COMPLICATED LEFT;  Surgeon: Brasington, Chadwick, MD;  Location: [facility];  Service: Ophthalmology;  Laterality: Left;  MALYUGIN VISION BLUE   colonoscopy     ESOPHAGOGASTRODUODENOSCOPY (EGD) WITH PROPOFOL     EYE MUSCLE SURGERY Left    IR KYPHO THORACIC WITH BONE BIOPSY  11/11/2021   PORTA CATH INSERTION N/A 10/19/2020   Procedure: PORTA CATH INSERTION;  Surgeon: Dew, Jason S, MD;  Location: [facility];  Service: Cardiovascular;  Laterality: N/A;   TONSILLECTOMY AND ADENOIDECTOMY      Social History   Socioeconomic History   Marital status: Married    Spouse name: Eloise   Number of children: 4   Years of education: Not on file   Highest education level: Not on file  Occupational History   Occupation: retired  Tobacco Use   Smoking status: Never   Smokeless tobacco: Never  Vaping Use   Vaping Use: Never used  Substance and Sexual Activity   Alcohol use: No   Drug use: No   Sexual activity: Yes    Partners: Female  Other Topics Concern   Not  on file  Social History Narrative   Lives with wife in a 5 story retirement home.  He lives on the first floor.  4 year college degree.  Retired broadcaster.  Married 4 years to his first wife's sister.     Social Determinants of Health   Financial Resource Strain: Low Risk  (04/13/2020)   Overall Financial Resource Strain (CARDIA)    Difficulty of Paying Living Expenses: Not hard at all  Food Insecurity: No Food Insecurity (04/10/2019)   Hunger Vital Sign    Worried About Running Out of Food in the Last Year: Never true    Ran Out of Food in the Last Year: Never true  Transportation Needs: No Transportation Needs (04/13/2020)   PRAPARE - Transportation    Lack of Transportation (Medical): No    Lack of Transportation (Non-Medical): No  Physical Activity: Sufficiently Active (04/10/2019)   Exercise Vital Sign    Days of Exercise per Week: 5 days    Minutes of Exercise per Session: 30 min  Stress: No Stress Concern Present (04/13/2020)   [employer] - Occupational Stress Questionnaire    Feeling of Stress : Not at all  Social Connections: Unknown (04/13/2020)   Social Connection and Isolation Panel [NHANES]    Frequency of Communication with Friends and Family: Not on file    Frequency of Social Gatherings with Friends and Family: Not on file    Attends Religious Services: Not on file    Active Member of Clubs or Organizations: Not on file    Attends Club or Organization Meetings: Not on file    Marital Status: Married  Intimate Partner Violence: Not At Risk (  04/10/2019)   Humiliation, Afraid, Rape, and Kick questionnaire    Fear of Current or Ex-Partner: No    Emotionally Abused: No    Physically Abused: No    Sexually Abused: No     Allergies  Allergen Reactions   Kenalog [Triamcinolone Acetonide]     Blindness X 3 Days injection     CBC    Component Value Date/Time   WBC 6.0 10/14/2022 1056   RBC 4.39 10/14/2022 1056   HGB 13.8  10/14/2022 1056   HGB 13.6 03/24/2021 1406   HCT 41.1 10/14/2022 1056   HCT 41.4 03/24/2021 1406   PLT 252 10/14/2022 1056   PLT 324 03/24/2021 1406   MCV 93.6 10/14/2022 1056   MCV 93 03/24/2021 1406   MCV 91 05/29/2014 0917   MCH 31.4 10/14/2022 1056   MCHC 33.6 10/14/2022 1056   RDW 14.1 10/14/2022 1056   RDW 12.4 03/24/2021 1406   RDW 13.4 05/29/2014 0917   LYMPHSABS 2.5 10/14/2022 1056   LYMPHSABS 1.2 05/29/2014 0917   MONOABS 0.7 10/14/2022 1056   MONOABS 0.3 05/29/2014 0917   EOSABS 0.1 10/14/2022 1056   EOSABS 0.1 05/29/2014 0917   BASOSABS 0.0 10/14/2022 1056   BASOSABS 0.0 05/29/2014 0917    Pulmonary Functions Testing Results:     No data to display          Outpatient Medications Prior to Visit  Medication Sig Dispense Refill   acetaminophen (TYLENOL) 500 MG tablet Take 1,000 mg by mouth every 6 (six) hours as needed for moderate pain.     Blood Glucose Monitoring Suppl (ONETOUCH VERIO) w/Device KIT Please use to check blood sugars once daily. Dx code: E11.65 1 kit 0   Blood Glucose Monitoring Suppl DEVI 1 each by Does not apply route daily. May substitute to any manufacturer covered by patient's insurance. 1 each 0   Calcium Carb-Cholecalciferol (CALCIUM 600 + D PO) Take 1 tablet by mouth daily.     cholecalciferol (VITAMIN D3) 25 MCG (1000 UNIT) tablet Take 1,000 Units by mouth daily.     Cranberry 500 MG CAPS Take 500 mg by mouth daily.     denosumab (PROLIA) 60 MG/ML SOSY injection Inject 60 mg into the skin every 6 (six) months. 1 mL 1   Glucose Blood (BLOOD GLUCOSE TEST STRIPS) STRP May substitute to any manufacturer covered by patient's insurance.  Use to  check blood sugar once daily at variable times 100 strip 0   glucose blood (ONETOUCH VERIO) test strip Use to check blood sugars one time daily. Dx code: E11.65 100 each 2   hydrocortisone (ANUSOL-HC) 2.5 % rectal cream Place 1 application rectally 2 (two) times daily. 30 g 0   Lancets (ONETOUCH  ULTRASOFT) lancets Use to check blood sugars once daily. Dx code: E11.65 100 each 2   loratadine (CLARITIN) 10 MG tablet Take 10 mg by mouth daily as needed for allergies.     metoprolol succinate (TOPROL-XL) 50 MG 24 hr tablet Take 1.5 tablets (75 mg total) by mouth daily. (Patient taking differently: Take 25 mg by mouth daily.) 30 tablet 1   omeprazole (PRILOSEC) 40 MG capsule TAKE 1 CAPSULE (40 MG TOTAL) BY MOUTH DAILY. 90 capsule 3   Polyethyl Glycol-Propyl Glycol (SYSTANE OP) Place 1 drop into both eyes daily.     Ranibizumab (LUCENTIS IO) Place 1 Dose into the right eye See admin instructions. Every 14 weeks     tamsulosin (FLOMAX) 0.4 MG CAPS capsule TAKE   1 CAPSULE BY MOUTH EVERY DAY AFTER SUPPER 90 capsule 1   vitamin B-12 (CYANOCOBALAMIN) 1000 MCG tablet Take 1,000 mcg by mouth daily.     UNABLE TO FIND Med Name: Relaxium to help with sleep (Patient not taking: Reported on 10/17/2022)     Facility-Administered Medications Prior to Visit  Medication Dose Route Frequency Provider Last Rate Last Admin   heparin lock flush 100 UNIT/ML injection            heparin lock flush 100 unit/mL  500 Units Intravenous Once Rao, Archana C, MD       

## 2022-10-18 ENCOUNTER — Other Ambulatory Visit: Payer: Self-pay | Admitting: Student in an Organized Health Care Education/Training Program

## 2022-10-18 DIAGNOSIS — R911 Solitary pulmonary nodule: Secondary | ICD-10-CM | POA: Insufficient documentation

## 2022-10-18 DIAGNOSIS — C251 Malignant neoplasm of body of pancreas: Secondary | ICD-10-CM

## 2022-10-19 ENCOUNTER — Telehealth: Payer: Self-pay

## 2022-10-19 ENCOUNTER — Encounter: Payer: Self-pay | Admitting: Student in an Organized Health Care Education/Training Program

## 2022-10-19 NOTE — Telephone Encounter (Signed)
Patient is scheduled for Bronchoscopy on 5/14 at 11:00am.  CT has been scheduled for 10/28/2022 at 4:30pm. Patient will arrive 2 hours before his Bronchoscopy for Pre-Admit and Covid testing. NPO after midnight the night before.  Per Vara Guardian, she has notified the patient of his appointments and mail a reminder letter to him. The patient is still trying to decide if he wants to go through with the Bronchoscopy. He will contact our office once he has spoken with his family.  Nothing further needed.

## 2022-10-22 ENCOUNTER — Other Ambulatory Visit: Payer: Self-pay | Admitting: Oncology

## 2022-10-24 ENCOUNTER — Other Ambulatory Visit: Payer: Self-pay | Admitting: *Deleted

## 2022-10-24 DIAGNOSIS — Z01818 Encounter for other preprocedural examination: Secondary | ICD-10-CM | POA: Diagnosis not present

## 2022-10-24 DIAGNOSIS — I480 Paroxysmal atrial fibrillation: Secondary | ICD-10-CM | POA: Diagnosis not present

## 2022-10-24 DIAGNOSIS — E785 Hyperlipidemia, unspecified: Secondary | ICD-10-CM | POA: Diagnosis not present

## 2022-10-24 DIAGNOSIS — I7121 Aneurysm of the ascending aorta, without rupture: Secondary | ICD-10-CM | POA: Diagnosis not present

## 2022-10-24 DIAGNOSIS — C251 Malignant neoplasm of body of pancreas: Secondary | ICD-10-CM | POA: Diagnosis not present

## 2022-10-24 MED ORDER — TAMSULOSIN HCL 0.4 MG PO CAPS: ORAL_CAPSULE | ORAL | 1 refills | Status: DC

## 2022-10-25 ENCOUNTER — Telehealth: Payer: Self-pay | Admitting: Internal Medicine

## 2022-10-25 NOTE — Telephone Encounter (Signed)
noted 

## 2022-10-25 NOTE — Telephone Encounter (Signed)
Pt called in staying that he has a lab appt for 5/10 to check his A1C, however as per pt, he already did blood work at another office on 4/26, he called to ask to cancel. I spoke to [facility] and its ok to cancel. Pt also said he will fax over his A1c check that he does at home.

## 2022-10-26 NOTE — Telephone Encounter (Signed)
Spoke to patient and confirmed that he wanted to proceed with bronchoscopy. He is aware of dates/times and voiced his understanding.  Nothing further needed.

## 2022-10-27 ENCOUNTER — Ambulatory Visit
Admission: RE | Admit: 2022-10-27 | Discharge: 2022-10-27 | Disposition: A | Discharge status: Home / Self Care | Payer: PPO | Source: Ambulatory Visit | Attending: Student in an Organized Health Care Education/Training Program | Admitting: Student in an Organized Health Care Education/Training Program

## 2022-10-27 DIAGNOSIS — C259 Malignant neoplasm of pancreas, unspecified: Secondary | ICD-10-CM | POA: Diagnosis not present

## 2022-10-27 DIAGNOSIS — R918 Other nonspecific abnormal finding of lung field: Secondary | ICD-10-CM | POA: Diagnosis not present

## 2022-10-27 DIAGNOSIS — R911 Solitary pulmonary nodule: Secondary | ICD-10-CM | POA: Diagnosis not present

## 2022-10-27 DIAGNOSIS — C251 Malignant neoplasm of body of pancreas: Secondary | ICD-10-CM | POA: Diagnosis not present

## 2022-10-27 DIAGNOSIS — C4491 Basal cell carcinoma of skin, unspecified: Secondary | ICD-10-CM | POA: Diagnosis not present

## 2022-10-28 ENCOUNTER — Other Ambulatory Visit: Payer: PPO

## 2022-10-28 ENCOUNTER — Ambulatory Visit: Admission: RE | Admit: 2022-10-28 | Payer: PPO | Source: Ambulatory Visit

## 2022-10-28 ENCOUNTER — Other Ambulatory Visit: Payer: Self-pay

## 2022-10-28 ENCOUNTER — Encounter (HOSPITAL_COMMUNITY): Payer: Self-pay | Admitting: Student in an Organized Health Care Education/Training Program

## 2022-10-28 NOTE — Progress Notes (Signed)
SDW call  Patient was given pre-op instructions over the phone. Patient verbalized understanding of instructions provided.    PCP - Almon Hercules, [place]  Cardiologist - [facility] Pulmonary:    PPM/ICD - Denies  Chest x-ray - n/a EKG -  DOS 5/14/024 Stress Test - ECHO -  Cardiac Cath -   Sleep Study/sleep apnea/CPAP: Denies  Non-diabetic   Blood Thinner Instructions: Denies Aspirin Instructions:Denies   ERAS Protcol - No PRE-SURGERY Ensure or G2-    COVID TEST- DOS 11/01/2022    Anesthesia review: No   Patient denies shortness of breath, fever, cough and chest pain over the phone call  Your procedure is scheduled on Tuesday Nov 01, 2022  Report to [facility] Main Entrance "A" at 0800 A.M., then check in with the Admitting office.  Call this number if you have problems the morning of surgery:  [phone]   If you have any questions prior to your surgery date call [phone]: Open Monday-Friday 8am-4pm If you experience any cold or flu symptoms such as cough, fever, chills, shortness of breath, etc. between now and your scheduled surgery, please notify us at the above number     Remember:  Do not eat or drink after midnight the night before your surgery Take these medicines the morning of surgery with A SIP OF WATER:  Metoprolol, omeprazole  As needed: Tylenol, loratadine  As of today, STOP taking any Aspirin (unless otherwise instructed by your surgeon) Aleve, Naproxen, Ibuprofen, Motrin, Advil, Goody's, BC's, all herbal medications, fish oil, and all vitamins.

## 2022-10-31 DIAGNOSIS — D485 Neoplasm of uncertain behavior of skin: Secondary | ICD-10-CM | POA: Diagnosis not present

## 2022-10-31 DIAGNOSIS — L570 Actinic keratosis: Secondary | ICD-10-CM | POA: Diagnosis not present

## 2022-10-31 DIAGNOSIS — D2271 Melanocytic nevi of right lower limb, including hip: Secondary | ICD-10-CM | POA: Diagnosis not present

## 2022-10-31 DIAGNOSIS — D2261 Melanocytic nevi of right upper limb, including shoulder: Secondary | ICD-10-CM | POA: Diagnosis not present

## 2022-10-31 DIAGNOSIS — C44719 Basal cell carcinoma of skin of left lower limb, including hip: Secondary | ICD-10-CM | POA: Diagnosis not present

## 2022-10-31 DIAGNOSIS — D225 Melanocytic nevi of trunk: Secondary | ICD-10-CM | POA: Diagnosis not present

## 2022-10-31 DIAGNOSIS — Z85828 Personal history of other malignant neoplasm of skin: Secondary | ICD-10-CM | POA: Diagnosis not present

## 2022-11-01 ENCOUNTER — Ambulatory Visit (HOSPITAL_BASED_OUTPATIENT_CLINIC_OR_DEPARTMENT_OTHER): Payer: PPO | Admitting: Anesthesiology

## 2022-11-01 ENCOUNTER — Ambulatory Visit (HOSPITAL_COMMUNITY): Payer: PPO | Admitting: Anesthesiology

## 2022-11-01 ENCOUNTER — Ambulatory Visit (HOSPITAL_COMMUNITY): Payer: PPO

## 2022-11-01 ENCOUNTER — Encounter (HOSPITAL_COMMUNITY)
Admission: RE | Disposition: A | Discharge status: Home / Self Care | Payer: Self-pay | Source: Home / Self Care | Attending: Student in an Organized Health Care Education/Training Program

## 2022-11-01 ENCOUNTER — Other Ambulatory Visit: Payer: Self-pay

## 2022-11-01 ENCOUNTER — Encounter (HOSPITAL_COMMUNITY): Payer: Self-pay | Admitting: Student in an Organized Health Care Education/Training Program

## 2022-11-01 ENCOUNTER — Ambulatory Visit (HOSPITAL_COMMUNITY)
Admission: RE | Admit: 2022-11-01 | Discharge: 2022-11-01 | Disposition: A | Discharge status: Home / Self Care | Payer: PPO | Attending: Student in an Organized Health Care Education/Training Program | Admitting: Student in an Organized Health Care Education/Training Program

## 2022-11-01 DIAGNOSIS — R918 Other nonspecific abnormal finding of lung field: Secondary | ICD-10-CM | POA: Diagnosis not present

## 2022-11-01 DIAGNOSIS — Z9221 Personal history of antineoplastic chemotherapy: Secondary | ICD-10-CM | POA: Insufficient documentation

## 2022-11-01 DIAGNOSIS — K7460 Unspecified cirrhosis of liver: Secondary | ICD-10-CM | POA: Insufficient documentation

## 2022-11-01 DIAGNOSIS — E119 Type 2 diabetes mellitus without complications: Secondary | ICD-10-CM | POA: Diagnosis not present

## 2022-11-01 DIAGNOSIS — Z90411 Acquired partial absence of pancreas: Secondary | ICD-10-CM | POA: Diagnosis not present

## 2022-11-01 DIAGNOSIS — C251 Malignant neoplasm of body of pancreas: Secondary | ICD-10-CM | POA: Diagnosis not present

## 2022-11-01 DIAGNOSIS — I10 Essential (primary) hypertension: Secondary | ICD-10-CM | POA: Insufficient documentation

## 2022-11-01 DIAGNOSIS — Z8507 Personal history of malignant neoplasm of pancreas: Secondary | ICD-10-CM | POA: Diagnosis not present

## 2022-11-01 DIAGNOSIS — K219 Gastro-esophageal reflux disease without esophagitis: Secondary | ICD-10-CM | POA: Insufficient documentation

## 2022-11-01 DIAGNOSIS — Z1152 Encounter for screening for COVID-19: Secondary | ICD-10-CM | POA: Insufficient documentation

## 2022-11-01 DIAGNOSIS — F419 Anxiety disorder, unspecified: Secondary | ICD-10-CM | POA: Diagnosis not present

## 2022-11-01 DIAGNOSIS — R911 Solitary pulmonary nodule: Secondary | ICD-10-CM | POA: Insufficient documentation

## 2022-11-01 DIAGNOSIS — Z452 Encounter for adjustment and management of vascular access device: Secondary | ICD-10-CM | POA: Diagnosis not present

## 2022-11-01 DIAGNOSIS — Z79899 Other long term (current) drug therapy: Secondary | ICD-10-CM | POA: Diagnosis not present

## 2022-11-01 HISTORY — PX: BRONCHIAL BRUSHINGS: SHX5108

## 2022-11-01 HISTORY — DX: Essential (primary) hypertension: I10

## 2022-11-01 HISTORY — PX: BRONCHIAL WASHINGS: SHX5105

## 2022-11-01 HISTORY — PX: BRONCHIAL NEEDLE ASPIRATION BIOPSY: SHX5106

## 2022-11-01 LAB — SARS CORONAVIRUS 2 BY RT PCR: SARS Coronavirus 2 by RT PCR: NEGATIVE

## 2022-11-01 SURGERY — BRONCHOSCOPY, WITH BIOPSY USING ELECTROMAGNETIC NAVIGATION
Anesthesia: General | Laterality: Bilateral

## 2022-11-01 MED ORDER — FENTANYL CITRATE (PF) 250 MCG/5ML IJ SOLN
INTRAMUSCULAR | Status: DC | PRN
  Administered 2022-11-01: 100 ug via INTRAVENOUS

## 2022-11-01 MED ORDER — CHLORHEXIDINE GLUCONATE 0.12 % MT SOLN
15.0000 mL | Freq: Once | OROMUCOSAL | Status: AC
  Administered 2022-11-01: 15 mL via OROMUCOSAL

## 2022-11-01 MED ORDER — PHENYLEPHRINE 80 MCG/ML (10ML) SYRINGE FOR IV PUSH (FOR BLOOD PRESSURE SUPPORT)
PREFILLED_SYRINGE | INTRAVENOUS | Status: DC | PRN
  Administered 2022-11-01 (×3): 160 ug via INTRAVENOUS

## 2022-11-01 MED ORDER — FENTANYL CITRATE (PF) 100 MCG/2ML IJ SOLN: 25.0000 ug | INTRAMUSCULAR | Status: DC | PRN

## 2022-11-01 MED ORDER — EPHEDRINE SULFATE-NACL 50-0.9 MG/10ML-% IV SOSY
PREFILLED_SYRINGE | INTRAVENOUS | Status: DC | PRN
  Administered 2022-11-01 (×2): 10 mg via INTRAVENOUS

## 2022-11-01 MED ORDER — SUGAMMADEX SODIUM 200 MG/2ML IV SOLN
INTRAVENOUS | Status: DC | PRN
  Administered 2022-11-01: 200 mg via INTRAVENOUS

## 2022-11-01 MED ORDER — CHLORHEXIDINE GLUCONATE 0.12 % MT SOLN
OROMUCOSAL | Status: AC
  Filled 2022-11-01: qty 15

## 2022-11-01 MED ORDER — ACETAMINOPHEN 10 MG/ML IV SOLN: 1000.0000 mg | Freq: Once | INTRAVENOUS | Status: DC | PRN

## 2022-11-01 MED ORDER — LACTATED RINGERS IV SOLN: INTRAVENOUS | Status: DC

## 2022-11-01 MED ORDER — PROPOFOL 10 MG/ML IV BOLUS
INTRAVENOUS | Status: DC | PRN
  Administered 2022-11-01: 120 mg via INTRAVENOUS

## 2022-11-01 MED ORDER — PROPOFOL 500 MG/50ML IV EMUL
INTRAVENOUS | Status: DC | PRN
  Administered 2022-11-01: 125 ug/kg/min via INTRAVENOUS

## 2022-11-01 MED ORDER — ROCURONIUM BROMIDE 10 MG/ML (PF) SYRINGE
PREFILLED_SYRINGE | INTRAVENOUS | Status: DC | PRN
  Administered 2022-11-01: 60 mg via INTRAVENOUS

## 2022-11-01 MED ORDER — ONDANSETRON HCL 4 MG/2ML IJ SOLN
INTRAMUSCULAR | Status: DC | PRN
  Administered 2022-11-01: 4 mg via INTRAVENOUS

## 2022-11-01 MED ORDER — PHENYLEPHRINE HCL-NACL 20-0.9 MG/250ML-% IV SOLN
INTRAVENOUS | Status: DC | PRN
  Administered 2022-11-01: 40 ug/min via INTRAVENOUS

## 2022-11-01 MED ORDER — LIDOCAINE 2% (20 MG/ML) 5 ML SYRINGE
INTRAMUSCULAR | Status: DC | PRN
  Administered 2022-11-01: 40 mg via INTRAVENOUS

## 2022-11-01 NOTE — Transfer of Care (Signed)
Immediate Anesthesia Transfer of Care Note  Patient: George Owens  Procedure(s) Performed: ROBOTIC ASSISTED NAVIGATIONAL BRONCHOSCOPY (Bilateral) BRONCHIAL BRUSHINGS BRONCHIAL NEEDLE ASPIRATION BIOPSIES BRONCHIAL WASHINGS  Patient Location: PACU  Anesthesia Type:General  Level of Consciousness: awake, drowsy, and patient cooperative  Airway & Oxygen Therapy: Patient Spontanous Breathing  Post-op Assessment: Report given to RN and Post -op Vital signs reviewed and stable  Post vital signs: Reviewed and stable  Last Vitals:  Vitals Value Taken Time  BP 128/65 11/01/22 1242  Temp 36.3 C 11/01/22 1245  Pulse 69 11/01/22 1245  Resp 19 11/01/22 1245  SpO2 96 % 11/01/22 1245  Vitals shown include unvalidated device data.  Last Pain:  Vitals:   11/01/22 0829  TempSrc:   PainSc: 0-No pain         Complications: No notable events documented.

## 2022-11-01 NOTE — Anesthesia Preprocedure Evaluation (Signed)
Anesthesia Evaluation  Patient identified by MRN, date of birth, ID band Patient awake    Reviewed: Allergy & Precautions, NPO status , Patient's Chart, lab work & pertinent test results  Airway Mallampati: II  TM Distance: >3 FB Neck ROM: Full    Dental no notable dental hx.    Pulmonary neg pulmonary ROS   Pulmonary exam normal        Cardiovascular hypertension, Pt. on medications and Pt. on home beta blockers  Rhythm:Regular Rate:Normal     Neuro/Psych   Anxiety     negative neurological ROS     GI/Hepatic ,GERD  Medicated,,(+) Cirrhosis       Pancreatic Ca   Endo/Other  diabetes    Renal/GU negative Renal ROS  negative genitourinary   Musculoskeletal negative musculoskeletal ROS (+)    Abdominal Normal abdominal exam  (+)   Peds  Hematology   Anesthesia Other Findings   Reproductive/Obstetrics                             Anesthesia Physical Anesthesia Plan  ASA: 3  Anesthesia Plan: General   Post-op Pain Management:    Induction: Intravenous  PONV Risk Score and Plan: 2 and Ondansetron, Dexamethasone, Midazolam, Treatment may vary due to age or medical condition and Propofol infusion  Airway Management Planned: Mask and Oral ETT  Additional Equipment: None  Intra-op Plan:   Post-operative Plan: Extubation in OR  Informed Consent: I have reviewed the patients History and Physical, chart, labs and discussed the procedure including the risks, benefits and alternatives for the proposed anesthesia with the patient or authorized representative who has indicated his/her understanding and acceptance.     Dental advisory given  Plan Discussed with: CRNA  Anesthesia Plan Comments: (Lab Results      Component                Value               Date                      WBC                      6.0                 10/14/2022                HGB                      13.8                 10/14/2022                HCT                      41.1                10/14/2022                MCV                      93.6                10/14/2022                PLT  252                 10/14/2022           )       Anesthesia Quick Evaluation

## 2022-11-01 NOTE — Anesthesia Procedure Notes (Addendum)
Procedure Name: Intubation Date/Time: 11/01/2022 11:47 AM  Performed by: Gus Puma, CRNAPre-anesthesia Checklist: Patient identified, Emergency Drugs available, Suction available and Patient being monitored Patient Re-evaluated:Patient Re-evaluated prior to induction Oxygen Delivery Method: Circle System Utilized Preoxygenation: Pre-oxygenation with 100% oxygen Induction Type: IV induction Ventilation: Mask ventilation without difficulty Laryngoscope Size: Glidescope and 4 Grade View: Grade I Tube type: Oral Tube size: 8.5 mm Number of attempts: 1 Airway Equipment and Method: Stylet and Oral airway Placement Confirmation: ETT inserted through vocal cords under direct vision, positive ETCO2 and breath sounds checked- equal and bilateral Secured at: 23 cm Tube secured with: Tape Dental Injury: Teeth and Oropharynx as per pre-operative assessment  Comments: Atraumatic intubation  1st attempt with MAC 4 by CRNA -- Grade III view -- no attempt to thread ETT  2nd attempt with Glidescope S4 by CRNA -- Grade I view -- successful intubation  Patient masked between attempts with no alterations in vital signs

## 2022-11-01 NOTE — Op Note (Signed)
Video Bronchoscopy with Robotic Assisted Bronchoscopic Navigation   Date of Operation: 11/01/2022   Pre-op Diagnosis: Pulmonary nodules  Surgeon: Raechel Chute, MD  Anesthesia: General endotracheal anesthesia  Operation: Flexible video fiberoptic bronchoscopy with robotic assistance and biopsies.  Estimated Blood Loss: Minimal  Complications: None  Indications and History: George Owens is a 89 y.o. male with history of pancreatic cancer, s/p resection, presenting with multiple pulmonary nodules. Index nodule in the RLL is the largest and he is presenting for robotic assisted navigational bronchoscopy. The risks, benefits, complications, treatment options and expected outcomes were discussed with the patient.  The possibilities of pneumothorax, pneumonia, reaction to medication, pulmonary aspiration, perforation of a viscus, bleeding, failure to diagnose a condition and creating a complication requiring transfusion or operation were discussed with the patient who freely signed the consent.    Description of Procedure: The patient was seen in the Preoperative Area, was examined and was deemed appropriate to proceed.  The patient was taken to [facility] endoscopy room 3, identified as George Owens and the procedure verified as Flexible Video Fiberoptic Bronchoscopy.  A Time Out was held and the above information confirmed.   Prior to the date of the procedure a high-resolution CT scan of the chest was performed. Utilizing ION software program a virtual tracheobronchial tree was generated to allow the creation of distinct navigation pathways to the patient's parenchymal abnormalities. After being taken to the operating room general anesthesia was initiated and the patient  was orally intubated. The video fiberoptic bronchoscope was introduced via the endotracheal tube and a general inspection was performed which showed normal right and left lung anatomy, aspiration of the bilateral mainstems was  completed to remove any remaining secretions. Robotic catheter inserted into patient's endotracheal tube.   Target #1 RLL: The distinct navigation pathways prepared prior to this procedure were then utilized to navigate to patient's lesion identified on CT scan. The robotic catheter was secured into place and the vision probe was withdrawn. We utilized the CIOS spin to confirm lesion and catheter location, and update the navigational software. Under fluoroscopic guidance transbronchial brushings and transbronchial needle biopsies were performed to be sent for cytology and pathology. A bronchioalveolar lavage was performed in the RLL and sent for cytology.  At the end of the procedure a general airway inspection was performed and there was no evidence of active bleeding. The bronchoscope was removed. The patient tolerated the procedure well. There was no significant blood loss and there were no obvious complications. A post-procedural chest x-ray is pending.  Samples Target #1: RLL 1. Transbronchial needle brushings from RLL nodule 2. Transbronchial Wang needle biopsies from RLL nodule 3. Bronchoalveolar lavage from RLL  Plans:  The patient will be discharged from the PACU to home when recovered from anesthesia and after chest x-ray is reviewed. We will review the cytology results with the patient when they become available. Outpatient followup will be with Dr. Smith Robert.  Raechel Chute, MD [facility] 11/01/2022 12:29 PM

## 2022-11-01 NOTE — Anesthesia Postprocedure Evaluation (Signed)
Anesthesia Post Note  Patient: George Owens  Procedure(s) Performed: ROBOTIC ASSISTED NAVIGATIONAL BRONCHOSCOPY (Bilateral) BRONCHIAL BRUSHINGS BRONCHIAL NEEDLE ASPIRATION BIOPSIES BRONCHIAL WASHINGS     Patient location during evaluation: PACU Anesthesia Type: General Level of consciousness: awake and alert Pain management: pain level controlled Vital Signs Assessment: post-procedure vital signs reviewed and stable Respiratory status: spontaneous breathing, nonlabored ventilation, respiratory function stable and patient connected to nasal cannula oxygen Cardiovascular status: blood pressure returned to baseline and stable Postop Assessment: no apparent nausea or vomiting Anesthetic complications: no   No notable events documented.  Last Vitals:  Vitals:   11/01/22 1345 11/01/22 1400  BP: (!) 144/81 (!) 148/68  Pulse: 69 62  Resp: (!) 23 15  Temp:  (!) 36.3 C  SpO2: 91% 97%    Last Pain:  Vitals:   11/01/22 1400  TempSrc:   PainSc: 0-No pain                 Earl Lites P Ricci Paff

## 2022-11-01 NOTE — Interval H&P Note (Signed)
Patient is a pleasant 89 year old male with a history of pancreatic cancer, s/p resection, who was found to have multiple pulmonary nodules on chest imaging. He was referred to me for biopsy of said nodules. He was counseled, at length, on the risks vs benefits of procedural intervention at his age. This includes the risk from anesthesia itself as well as risk of bleeding and pneumothorax. Please see separate visit note for more on that. Today, he reports no symptoms, and denies chest pain.  Vitals:   11/01/22 0818  BP: 139/70  Pulse: 62  Resp: 18  Temp: 98.6 F (37 C)  SpO2: 98%   General: Well-appearing and in no distress. Well-nourished Eyes: Anicteric, no conjunctival pallor HEENT: Mucous membranes moist, no evidence of postnasal drip Lymphadenopathy: No cervical or supraclavicular adenopathy Respiratory: Trachea is midline, no respiratory distress, good bilateral air entry, no wheezes, rales, or rhonchi Cardiovascular: Heart with regular rate and rhythm, normal S1 and S2, no murmurs, rubs, or gallops Gastrointestinal: Normoactive bowel sounds, soft and nontender Neuro: Alert and oriented, no gross focal deficits   Patient is appropriate for the procedure. Will proceed with robotic assisted navigational bronchoscopy for biopsy of highly suspicious RLL nodules.  Raechel Chute, MD [facility] 11/01/2022 11:27 AM

## 2022-11-02 LAB — CYTOLOGY - NON PAP

## 2022-11-03 ENCOUNTER — Encounter (HOSPITAL_COMMUNITY): Payer: Self-pay | Admitting: Student in an Organized Health Care Education/Training Program

## 2022-11-16 DIAGNOSIS — H43813 Vitreous degeneration, bilateral: Secondary | ICD-10-CM | POA: Diagnosis not present

## 2022-11-16 DIAGNOSIS — H26492 Other secondary cataract, left eye: Secondary | ICD-10-CM | POA: Diagnosis not present

## 2022-11-16 DIAGNOSIS — H353223 Exudative age-related macular degeneration, left eye, with inactive scar: Secondary | ICD-10-CM | POA: Diagnosis not present

## 2022-11-16 DIAGNOSIS — H31093 Other chorioretinal scars, bilateral: Secondary | ICD-10-CM | POA: Diagnosis not present

## 2022-11-16 DIAGNOSIS — H353211 Exudative age-related macular degeneration, right eye, with active choroidal neovascularization: Secondary | ICD-10-CM | POA: Diagnosis not present

## 2022-11-23 DIAGNOSIS — C251 Malignant neoplasm of body of pancreas: Secondary | ICD-10-CM | POA: Diagnosis not present

## 2022-12-20 ENCOUNTER — Inpatient Hospital Stay: Payer: PPO | Attending: Oncology

## 2022-12-20 DIAGNOSIS — Z452 Encounter for adjustment and management of vascular access device: Secondary | ICD-10-CM | POA: Insufficient documentation

## 2022-12-20 DIAGNOSIS — Z95828 Presence of other vascular implants and grafts: Secondary | ICD-10-CM

## 2022-12-20 DIAGNOSIS — Z8507 Personal history of malignant neoplasm of pancreas: Secondary | ICD-10-CM | POA: Insufficient documentation

## 2022-12-20 MED ORDER — SODIUM CHLORIDE 0.9% FLUSH
10.0000 mL | Freq: Once | INTRAVENOUS | Status: AC
  Administered 2022-12-20: 10 mL via INTRAVENOUS
  Filled 2022-12-20: qty 10

## 2022-12-20 MED ORDER — HEPARIN SOD (PORK) LOCK FLUSH 100 UNIT/ML IV SOLN
500.0000 [IU] | Freq: Once | INTRAVENOUS | Status: AC
  Administered 2022-12-20: 500 [IU] via INTRAVENOUS
  Filled 2022-12-20: qty 5

## 2023-01-10 ENCOUNTER — Emergency Department: Payer: PPO

## 2023-01-10 ENCOUNTER — Other Ambulatory Visit: Payer: Self-pay

## 2023-01-10 ENCOUNTER — Inpatient Hospital Stay
Admission: EM | Admit: 2023-01-10 | Discharge: 2023-01-12 | DRG: 445 | Disposition: A | Discharge status: Home / Self Care | Payer: PPO | Attending: Family Medicine | Admitting: Family Medicine

## 2023-01-10 DIAGNOSIS — I493 Ventricular premature depolarization: Secondary | ICD-10-CM | POA: Diagnosis present

## 2023-01-10 DIAGNOSIS — I714 Abdominal aortic aneurysm, without rupture, unspecified: Secondary | ICD-10-CM | POA: Diagnosis not present

## 2023-01-10 DIAGNOSIS — R0789 Other chest pain: Secondary | ICD-10-CM | POA: Diagnosis not present

## 2023-01-10 DIAGNOSIS — Z823 Family history of stroke: Secondary | ICD-10-CM

## 2023-01-10 DIAGNOSIS — K7460 Unspecified cirrhosis of liver: Secondary | ICD-10-CM | POA: Diagnosis not present

## 2023-01-10 DIAGNOSIS — M810 Age-related osteoporosis without current pathological fracture: Secondary | ICD-10-CM | POA: Diagnosis not present

## 2023-01-10 DIAGNOSIS — Z961 Presence of intraocular lens: Secondary | ICD-10-CM | POA: Diagnosis present

## 2023-01-10 DIAGNOSIS — D72829 Elevated white blood cell count, unspecified: Secondary | ICD-10-CM | POA: Diagnosis not present

## 2023-01-10 DIAGNOSIS — C7800 Secondary malignant neoplasm of unspecified lung: Secondary | ICD-10-CM | POA: Diagnosis present

## 2023-01-10 DIAGNOSIS — K8020 Calculus of gallbladder without cholecystitis without obstruction: Secondary | ICD-10-CM | POA: Diagnosis not present

## 2023-01-10 DIAGNOSIS — Z85828 Personal history of other malignant neoplasm of skin: Secondary | ICD-10-CM

## 2023-01-10 DIAGNOSIS — K838 Other specified diseases of biliary tract: Secondary | ICD-10-CM | POA: Diagnosis not present

## 2023-01-10 DIAGNOSIS — Z86010 Personal history of colonic polyps: Secondary | ICD-10-CM

## 2023-01-10 DIAGNOSIS — N400 Enlarged prostate without lower urinary tract symptoms: Secondary | ICD-10-CM | POA: Diagnosis not present

## 2023-01-10 DIAGNOSIS — I10 Essential (primary) hypertension: Secondary | ICD-10-CM

## 2023-01-10 DIAGNOSIS — Z8616 Personal history of COVID-19: Secondary | ICD-10-CM

## 2023-01-10 DIAGNOSIS — I4891 Unspecified atrial fibrillation: Secondary | ICD-10-CM | POA: Diagnosis present

## 2023-01-10 DIAGNOSIS — R748 Abnormal levels of other serum enzymes: Secondary | ICD-10-CM | POA: Diagnosis not present

## 2023-01-10 DIAGNOSIS — R7401 Elevation of levels of liver transaminase levels: Secondary | ICD-10-CM | POA: Diagnosis not present

## 2023-01-10 DIAGNOSIS — Z8546 Personal history of malignant neoplasm of prostate: Secondary | ICD-10-CM

## 2023-01-10 DIAGNOSIS — G629 Polyneuropathy, unspecified: Secondary | ICD-10-CM | POA: Diagnosis present

## 2023-01-10 DIAGNOSIS — Z9841 Cataract extraction status, right eye: Secondary | ICD-10-CM

## 2023-01-10 DIAGNOSIS — Z807 Family history of other malignant neoplasms of lymphoid, hematopoietic and related tissues: Secondary | ICD-10-CM

## 2023-01-10 DIAGNOSIS — K219 Gastro-esophageal reflux disease without esophagitis: Secondary | ICD-10-CM | POA: Diagnosis not present

## 2023-01-10 DIAGNOSIS — K8071 Calculus of gallbladder and bile duct without cholecystitis with obstruction: Secondary | ICD-10-CM | POA: Diagnosis not present

## 2023-01-10 DIAGNOSIS — Z8249 Family history of ischemic heart disease and other diseases of the circulatory system: Secondary | ICD-10-CM | POA: Diagnosis not present

## 2023-01-10 DIAGNOSIS — I722 Aneurysm of renal artery: Secondary | ICD-10-CM | POA: Diagnosis not present

## 2023-01-10 DIAGNOSIS — R1084 Generalized abdominal pain: Secondary | ICD-10-CM | POA: Diagnosis not present

## 2023-01-10 DIAGNOSIS — R9389 Abnormal findings on diagnostic imaging of other specified body structures: Secondary | ICD-10-CM | POA: Diagnosis not present

## 2023-01-10 DIAGNOSIS — K8050 Calculus of bile duct without cholangitis or cholecystitis without obstruction: Secondary | ICD-10-CM

## 2023-01-10 DIAGNOSIS — Z9842 Cataract extraction status, left eye: Secondary | ICD-10-CM | POA: Diagnosis not present

## 2023-01-10 DIAGNOSIS — E785 Hyperlipidemia, unspecified: Secondary | ICD-10-CM | POA: Diagnosis not present

## 2023-01-10 DIAGNOSIS — Z8507 Personal history of malignant neoplasm of pancreas: Secondary | ICD-10-CM | POA: Diagnosis not present

## 2023-01-10 DIAGNOSIS — Z79899 Other long term (current) drug therapy: Secondary | ICD-10-CM

## 2023-01-10 DIAGNOSIS — R1013 Epigastric pain: Secondary | ICD-10-CM

## 2023-01-10 DIAGNOSIS — I700 Atherosclerosis of aorta: Secondary | ICD-10-CM | POA: Diagnosis not present

## 2023-01-10 DIAGNOSIS — K8070 Calculus of gallbladder and bile duct without cholecystitis without obstruction: Secondary | ICD-10-CM | POA: Diagnosis not present

## 2023-01-10 DIAGNOSIS — R072 Precordial pain: Secondary | ICD-10-CM | POA: Diagnosis present

## 2023-01-10 DIAGNOSIS — Z8261 Family history of arthritis: Secondary | ICD-10-CM

## 2023-01-10 DIAGNOSIS — R918 Other nonspecific abnormal finding of lung field: Secondary | ICD-10-CM | POA: Diagnosis not present

## 2023-01-10 DIAGNOSIS — K8021 Calculus of gallbladder without cholecystitis with obstruction: Secondary | ICD-10-CM | POA: Diagnosis not present

## 2023-01-10 DIAGNOSIS — Z90411 Acquired partial absence of pancreas: Secondary | ICD-10-CM

## 2023-01-10 DIAGNOSIS — K828 Other specified diseases of gallbladder: Secondary | ICD-10-CM | POA: Diagnosis not present

## 2023-01-10 DIAGNOSIS — Z888 Allergy status to other drugs, medicaments and biological substances status: Secondary | ICD-10-CM

## 2023-01-10 DIAGNOSIS — Z808 Family history of malignant neoplasm of other organs or systems: Secondary | ICD-10-CM

## 2023-01-10 DIAGNOSIS — R079 Chest pain, unspecified: Secondary | ICD-10-CM | POA: Diagnosis not present

## 2023-01-10 DIAGNOSIS — Z9081 Acquired absence of spleen: Secondary | ICD-10-CM

## 2023-01-10 LAB — CBC WITH DIFFERENTIAL/PLATELET
Abs Immature Granulocytes: 0.05 10*3/uL (ref 0.00–0.07)
Basophils Absolute: 0 10*3/uL (ref 0.0–0.1)
Basophils Relative: 0 %
Eosinophils Absolute: 0 10*3/uL (ref 0.0–0.5)
Eosinophils Relative: 0 %
HCT: 42.4 % (ref 39.0–52.0)
Hemoglobin: 14.4 g/dL (ref 13.0–17.0)
Immature Granulocytes: 0 %
Lymphocytes Relative: 12 %
Lymphs Abs: 1.5 10*3/uL (ref 0.7–4.0)
MCH: 30.9 pg (ref 26.0–34.0)
MCHC: 34 g/dL (ref 30.0–36.0)
MCV: 91 fL (ref 80.0–100.0)
Monocytes Absolute: 1 10*3/uL (ref 0.1–1.0)
Monocytes Relative: 8 %
Neutro Abs: 10.2 10*3/uL — ABNORMAL HIGH (ref 1.7–7.7)
Neutrophils Relative %: 80 %
Platelets: 279 10*3/uL (ref 150–400)
RBC: 4.66 MIL/uL (ref 4.22–5.81)
RDW: 13.8 % (ref 11.5–15.5)
WBC: 12.8 10*3/uL — ABNORMAL HIGH (ref 4.0–10.5)
nRBC: 0 % (ref 0.0–0.2)

## 2023-01-10 LAB — COMPREHENSIVE METABOLIC PANEL
ALT: 135 U/L — ABNORMAL HIGH (ref 0–44)
AST: 239 U/L — ABNORMAL HIGH (ref 15–41)
Albumin: 3.9 g/dL (ref 3.5–5.0)
Alkaline Phosphatase: 68 U/L (ref 38–126)
Anion gap: 10 (ref 5–15)
BUN: 22 mg/dL (ref 8–23)
CO2: 27 mmol/L (ref 22–32)
Calcium: 9.2 mg/dL (ref 8.9–10.3)
Chloride: 98 mmol/L (ref 98–111)
Creatinine, Ser: 0.78 mg/dL (ref 0.61–1.24)
GFR, Estimated: >60 mL/min (ref >60)
Glucose, Bld: 185 mg/dL — ABNORMAL HIGH (ref 70–99)
Potassium: 3.9 mmol/L (ref 3.5–5.1)
Sodium: 135 mmol/L (ref 135–145)
Total Bilirubin: 1.8 mg/dL — ABNORMAL HIGH (ref 0.3–1.2)
Total Protein: 7.1 g/dL (ref 6.5–8.1)

## 2023-01-10 LAB — BRAIN NATRIURETIC PEPTIDE: B Natriuretic Peptide: 137.6 pg/mL — ABNORMAL HIGH (ref 0.0–100.0)

## 2023-01-10 LAB — TROPONIN I (HIGH SENSITIVITY)
Troponin I (High Sensitivity): 6 ng/L (ref <18)
Troponin I (High Sensitivity): 5 ng/L (ref <18)

## 2023-01-10 MED ORDER — ONDANSETRON HCL 4 MG/2ML IJ SOLN: 4.0000 mg | Freq: Four times a day (QID) | INTRAMUSCULAR | Status: DC | PRN

## 2023-01-10 MED ORDER — ONDANSETRON HCL 4 MG PO TABS: 4.0000 mg | ORAL_TABLET | Freq: Four times a day (QID) | ORAL | Status: DC | PRN

## 2023-01-10 MED ORDER — KETOROLAC TROMETHAMINE 15 MG/ML IJ SOLN
15.0000 mg | Freq: Once | INTRAMUSCULAR | Status: AC
  Administered 2023-01-10: 15 mg via INTRAVENOUS
  Filled 2023-01-10: qty 1

## 2023-01-10 MED ORDER — IOHEXOL 300 MG/ML  SOLN
100.0000 mL | Freq: Once | INTRAMUSCULAR | Status: AC | PRN
  Administered 2023-01-10: 100 mL via INTRAVENOUS

## 2023-01-10 MED ORDER — POLYVINYL ALCOHOL 1.4 % OP SOLN: 2.0000 [drp] | Freq: Two times a day (BID) | OPHTHALMIC | Status: DC | PRN

## 2023-01-10 MED ORDER — TAMSULOSIN HCL 0.4 MG PO CAPS: 0.4000 mg | ORAL_CAPSULE | Freq: Every day | ORAL | Status: DC

## 2023-01-10 MED ORDER — PIPERACILLIN-TAZOBACTAM 3.375 G IVPB
3.3750 g | Freq: Three times a day (TID) | INTRAVENOUS | Status: DC
  Administered 2023-01-11 – 2023-01-12 (×4): 3.375 g via INTRAVENOUS
  Filled 2023-01-10 (×4): qty 50

## 2023-01-10 MED ORDER — SODIUM CHLORIDE 0.9 % IV BOLUS
1000.0000 mL | Freq: Once | INTRAVENOUS | Status: AC
  Administered 2023-01-10: 1000 mL via INTRAVENOUS

## 2023-01-10 MED ORDER — MORPHINE SULFATE (PF) 2 MG/ML IV SOLN: 2.0000 mg | INTRAVENOUS | Status: DC | PRN

## 2023-01-10 MED ORDER — ENOXAPARIN SODIUM 40 MG/0.4ML IJ SOSY
40.0000 mg | PREFILLED_SYRINGE | INTRAMUSCULAR | Status: DC
  Administered 2023-01-10 – 2023-01-11 (×2): 40 mg via SUBCUTANEOUS
  Filled 2023-01-10 (×2): qty 0.4

## 2023-01-10 MED ORDER — SODIUM CHLORIDE 0.9 % IV SOLN: INTRAVENOUS | Status: DC

## 2023-01-10 MED ORDER — TAMSULOSIN HCL 0.4 MG PO CAPS
0.4000 mg | ORAL_CAPSULE | Freq: Every day | ORAL | Status: DC
  Administered 2023-01-10 – 2023-01-11 (×2): 0.4 mg via ORAL
  Filled 2023-01-10 (×2): qty 1

## 2023-01-10 MED ORDER — FENTANYL CITRATE PF 50 MCG/ML IJ SOSY: 50.0000 ug | PREFILLED_SYRINGE | INTRAMUSCULAR | Status: DC | PRN

## 2023-01-10 MED ORDER — GADOBUTROL 1 MMOL/ML IV SOLN
8.0000 mL | Freq: Once | INTRAVENOUS | Status: AC | PRN
  Administered 2023-01-10: 8 mL via INTRAVENOUS

## 2023-01-10 MED ORDER — PANTOPRAZOLE SODIUM 40 MG PO TBEC
40.0000 mg | DELAYED_RELEASE_TABLET | Freq: Every day | ORAL | Status: DC
  Administered 2023-01-11 – 2023-01-12 (×2): 40 mg via ORAL
  Filled 2023-01-10 (×2): qty 1

## 2023-01-10 MED ORDER — LORATADINE 10 MG PO TABS: 10.0000 mg | ORAL_TABLET | Freq: Every day | ORAL | Status: DC | PRN

## 2023-01-10 MED ORDER — PIPERACILLIN-TAZOBACTAM 3.375 G IVPB
3.3750 g | Freq: Three times a day (TID) | INTRAVENOUS | Status: AC
  Administered 2023-01-11: 3.375 g via INTRAVENOUS
  Filled 2023-01-10: qty 50

## 2023-01-10 MED ORDER — ACETAMINOPHEN 650 MG RE SUPP: 650.0000 mg | Freq: Four times a day (QID) | RECTAL | Status: DC | PRN

## 2023-01-10 MED ORDER — ACETAMINOPHEN 325 MG PO TABS: 650.0000 mg | ORAL_TABLET | Freq: Four times a day (QID) | ORAL | Status: DC | PRN

## 2023-01-10 MED ORDER — PIPERACILLIN-TAZOBACTAM 3.375 G IVPB
3.3750 g | Freq: Three times a day (TID) | INTRAVENOUS | Status: DC
Start: 2023-01-11 — End: 2023-01-10

## 2023-01-10 MED ORDER — ACETAMINOPHEN 325 MG PO TABS
650.0000 mg | ORAL_TABLET | Freq: Once | ORAL | Status: AC
  Administered 2023-01-10: 650 mg via ORAL
  Filled 2023-01-10: qty 2

## 2023-01-10 MED ORDER — METOPROLOL SUCCINATE ER 25 MG PO TB24
25.0000 mg | ORAL_TABLET | Freq: Every morning | ORAL | Status: DC
  Administered 2023-01-11 – 2023-01-12 (×2): 25 mg via ORAL
  Filled 2023-01-10 (×2): qty 1

## 2023-01-10 MED ORDER — VITAMIN D 25 MCG (1000 UNIT) PO TABS
1000.0000 [IU] | ORAL_TABLET | Freq: Every morning | ORAL | Status: DC
  Administered 2023-01-11 – 2023-01-12 (×2): 1000 [IU] via ORAL
  Filled 2023-01-10 (×2): qty 1

## 2023-01-10 MED ORDER — MAGNESIUM HYDROXIDE 400 MG/5ML PO SUSP: 30.0000 mL | Freq: Every day | ORAL | Status: DC | PRN

## 2023-01-10 MED ORDER — OYSTER SHELL CALCIUM/D3 500-5 MG-MCG PO TABS
1.0000 | ORAL_TABLET | Freq: Every day | ORAL | Status: DC
  Administered 2023-01-11 – 2023-01-12 (×2): 1 via ORAL
  Filled 2023-01-10 (×2): qty 1

## 2023-01-10 MED ORDER — TRAZODONE HCL 50 MG PO TABS: 25.0000 mg | ORAL_TABLET | Freq: Every evening | ORAL | Status: DC | PRN

## 2023-01-10 MED ORDER — PIPERACILLIN-TAZOBACTAM 3.375 G IVPB 30 MIN: 3.3750 g | Freq: Three times a day (TID) | INTRAVENOUS | Status: DC

## 2023-01-10 MED ORDER — VITAMIN B-12 1000 MCG PO TABS
1000.0000 ug | ORAL_TABLET | Freq: Every evening | ORAL | Status: DC
  Administered 2023-01-10 – 2023-01-11 (×2): 1000 ug via ORAL
  Filled 2023-01-10 (×2): qty 1

## 2023-01-10 MED ORDER — PIPERACILLIN-TAZOBACTAM 3.375 G IVPB 30 MIN
3.3750 g | Freq: Once | INTRAVENOUS | Status: AC
  Administered 2023-01-10: 3.375 g via INTRAVENOUS
  Filled 2023-01-10: qty 50

## 2023-01-10 NOTE — Assessment & Plan Note (Signed)
-   We will continue Toprol-XL. 

## 2023-01-10 NOTE — ED Provider Notes (Signed)
[facility] Provider Note   Event Date/Time   First MD Initiated Contact with Patient 01/10/23 [phone]     (approximate) History  Chest Pain  HPI George Owens is a 89 y.o. male with a past medical history of pancreatic cancer with new pulmonary nodules however no chemotherapy since January who presents complaining of heavy substernal chest pain that is 5/10 and began at 4 AM this morning.  Patient denies any symptoms similar to this in the past.  Patient states that he has seen a cardiologist in the past due to postoperative atrial fibrillation and states that he has not had to see a cardiologist since then.  Patient denies being on any anticoagulation.  Patient endorses worsening chest pain and nausea with exertion ROS: Patient currently denies any vision changes, tinnitus, difficulty speaking, facial droop, sore throat, shortness of breath, abdominal pain, vomiting/diarrhea, dysuria, or weakness/numbness/paresthesias in any extremity   Physical Exam  Triage Vital Signs: ED Triage Vitals  Encounter Vitals Group     BP      Systolic BP Percentile      Diastolic BP Percentile      Pulse      Resp      Temp      Temp src      SpO2      Weight      Height      Head Circumference      Peak Flow      Pain Score      Pain Loc      Pain Education      Exclude from Growth Chart    Most recent vital signs: Vitals:   01/10/23 1345 01/10/23 1400  BP:  (!) 142/68  Pulse:  81  Resp: 20   Temp:    SpO2: 100%    General: Awake, oriented x4. CV:  Good peripheral perfusion.  Resp:  Normal effort.  Clear to auscultation bilaterally Abd:  No distention.  Other:  Elderly well-developed, well-nourished Caucasian male laying in bed in no acute distress ED Results / Procedures / Treatments  Labs (all labs ordered are listed, but only abnormal results are displayed) Labs Reviewed  BRAIN NATRIURETIC PEPTIDE - Abnormal; Notable for the following components:       Result Value   B Natriuretic Peptide 137.6 (*)    All other components within normal limits  CBC WITH DIFFERENTIAL/PLATELET - Abnormal; Notable for the following components:   WBC 12.8 (*)    Neutro Abs 10.2 (*)    All other components within normal limits  COMPREHENSIVE METABOLIC PANEL - Abnormal; Notable for the following components:   Glucose, Bld 185 (*)    AST 239 (*)    ALT 135 (*)    Total Bilirubin 1.8 (*)    All other components within normal limits  TROPONIN I (HIGH SENSITIVITY)  TROPONIN I (HIGH SENSITIVITY)   EKG ED ECG REPORT I, Merwyn Katos, the attending physician, personally viewed and interpreted this ECG. 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  Not clearly stones and recommending further workup with and without contrast and MRI -Agree with radiology assessment Official radiology report(s): US ABDOMEN LIMITED RUQ (LIVER/GB)  Result Date: 01/10/2023 CLINICAL DATA:  Transaminitis. Previous partial pancreatic resection for neoplasm EXAM: ULTRASOUND ABDOMEN LIMITED RIGHT UPPER QUADRANT COMPARISON:  None Available. FINDINGS: Gallbladder: Distended gallbladder with some nodularity posteriorly without clear shadowing. These could be nonshadowing stones versus other nodules are polyps. Common bile duct: Diameter: 3 mm  Liver: Diffusely echogenic hepatic parenchyma consistent with some fatty liver infiltration. Please correlate with the prior contrast CT scan. Portal vein is patent on color Doppler imaging with normal direction of blood flow towards the liver. Other: None. IMPRESSION: Distended gallbladder with some nodularity posteriorly along the wall. Not clearly stones. Recommend further workup when appropriate such as with and without contrast MRI. No biliary ductal dilatation. Electronically Signed   By: Karen Kays M.D.   On: 01/10/2023 13:14   CT ABDOMEN PELVIS W CONTRAST  Result Date: 01/10/2023 CLINICAL DATA:  Abdominal pain. Transaminitis. History of pancreatic cancer. Pathologic spinal fracture. EXAM: CT ABDOMEN AND PELVIS WITH CONTRAST TECHNIQUE: Multidetector CT imaging of the abdomen and pelvis was performed using the standard protocol following bolus administration of intravenous contrast. RADIATION DOSE REDUCTION: This exam was performed according to the departmental dose-optimization program which includes automated exposure control, adjustment of the mA and/or kV according to patient size and/or use of iterative reconstruction technique. CONTRAST:  OMNIPAQUE IOHEXOL 300 MG/ML  SOLN COMPARISON:  CT 10/07/2022 abdomen pelvis.  Chest CT 10/27/2022. FINDINGS: Lower chest: Multiple pulmonary nodules again identified consistent with known metastatic disease. Example nodule right lung base posteromedial with the transverse dimension today of 12 mm. On the prior abdomen pelvis CT 7 mm in the more recent chest CT 11 mm. Hepatobiliary: Stable punctate calcification in the inferior aspect of the right hepatic lobe. No other space-occupying liver lesion at this time. Patent portal vein. Gallbladder distended with some gallbladder wall thickening and some dependent nodularity. Please correlate for any known history. Pancreas: Surgical changes of distal pancreatectomy. Residual pancreatic head and neck with some  atrophy. No obvious mass in this location. Spleen: Spleen is surgically absent. Area of fat necrosis in the surgical bed. Adrenals/Urinary Tract: Adrenal glands are preserved. Stable Bosniak 1 exophytic lower pole lateral left-sided renal cyst. No enhancing renal mass or collecting system dilatation. No specific imaging follow-up of the benign cysts. Similar upper pole focus on the right. The ureters have normal course and caliber down to the bladder. Preserved contours of the urinary bladder. Stomach/Bowel: No oral contrast. Large bowel is of normal course and caliber with scattered stool. Normal appendix. Stomach and small bowel are nondilated. Vascular/Lymphatic: Normal caliber IVC. Diffuse vascular calcifications along the aorta as some noncalcified plaque. 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 Similar to previous when adjusting for technique. No developing new lymph node enlargement identified in the abdomen and pelvis. Reproductive: Radiation seeds along the prostate. Other: No free air or free fluid. Musculoskeletal: Curvature of the spine with degenerative changes. Degenerative changes of the pelvis. Moderate compression of the superior endplate of L2 is stable. More significant compression is seen at the edge of the imaging field, incompletely, at T9. Stable sclerotic lesion along the right femur. 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 Distended gallbladder with stones  and possible wall thickening. If there is further concern of acute gallbladder pathology, an ultrasound may be useful as the next step in the workup. 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 Electronically Signed   By: Karen Kays M.D.   On: 01/10/2023 11:05   DG Chest Port 1 View  Result Date: 01/10/2023 CLINICAL DATA:  chest pain EXAM: PORTABLE CHEST 1 VIEW COMPARISON:  CXR 11/01/22 FINDINGS:  Right-sided chest port in place with tip at cavoatrial junction. No pleural effusion. No pneumothorax. Unchanged cardiac and mediastinal contours. No focal airspace opacity. No radiographically apparent displaced rib fractures. Visualized upper abdomen is unremarkable. IMPRESSION: No focal airspace opacity Electronically Signed   By: Lorenza Cambridge M.D.   On: 01/10/2023 09:04   PROCEDURES: Critical Care performed: No .1-3 Lead EKG Interpretation  Performed by: Merwyn Katos, MD Authorized by: Merwyn Katos, MD     Interpretation: normal     ECG rate:  71   ECG rate assessment: normal     Rhythm: sinus rhythm     Ectopy: none     Conduction: normal    MEDICATIONS ORDERED IN ED: Medications  fentaNYL (SUBLIMAZE) injection 50 mcg (has no administration in time range)  sodium chloride 0.9 % bolus 1,000 mL (has no administration in time range)  gadobutrol (GADAVIST) 1 MMOL/ML injection 8 mL (has no administration in time range)  ketorolac (TORADOL) 15 MG/ML injection 15 mg (15 mg Intravenous Given 01/10/23 0944)  iohexol (OMNIPAQUE) 300 MG/ML solution 100 mL (100 mLs Intravenous Contrast Given 01/10/23 1008)   IMPRESSION / MDM / ASSESSMENT AND PLAN / ED COURSE  I reviewed the triage vital signs and the nursing notes.                             The patient is on the cardiac monitor to evaluate for evidence of arrhythmia and/or significant heart rate changes. Patient's presentation is most consistent with acute presentation with potential threat to life or bodily function. Patient is a 89 year old male who presents for midepigastric abdominal pain and substernal chest pain History and exam AAA, pancreatitis, SBO, appendicitis, mesenteric ischemia, nephrolithiasis, pyelonephritis, or diverticulitis.  ED Interventions: analgesia PRN ED Workup: CBC, BMP, LFTs, Lipase, CT abdomen/pelvis ED Interventions: NPO with IVF ABX: Zosyn 3.375g iv CT of the abdomen pelvis showing inconclusive  results with concern for possible cholecystitis with unknown level blockage.  Recommends MRI with and without contrast.  I spoke to Dr. Mia Creek in gastroenterology who recommended MRI and MRCP of the abdomen.  Care of this patient will be signed out to the oncoming physician at the end of my shift.  All pertinent patient information conveyed and all questions answered.  All further care and disposition decisions will be made by the oncoming physician.   FINAL CLINICAL IMPRESSION(S) / ED DIAGNOSES   Final diagnoses:  None   Rx / DC Orders   ED Discharge Orders     None      Note:  This document was prepared using Dragon voice recognition software and may include unintentional dictation errors.   Merwyn Katos, MD 01/10/23 [phone]

## 2023-01-10 NOTE — Assessment & Plan Note (Signed)
-   We will continue Flomax 

## 2023-01-10 NOTE — Assessment & Plan Note (Addendum)
-   This is in the common bile duct with some dilatation. - We will monitor LFTs. - Conservative management otherwise as above. - If he has worsening jaundice, he may need to be transferred if he agrees then.

## 2023-01-10 NOTE — Progress Notes (Incomplete)
The patient is admitted from ED to 2 C 02. A & O x 4. Denies any pain at this moment. He's oriented to his room, ascom/call bell and staff. The patient  is asking for his Flomax as he takes it at home every night per his statement. Also asking for all his meds to be resumed it possible. Notified Dr. Para March, new orders received from Dr. Brunetta Jeans.

## 2023-01-10 NOTE — Consult Note (Signed)
Consultation  Referring Provider:  ED    Admit date: 01/10/2023 Consult date: 01/10/2023         Reason for Consultation:  Abnormal imaging             HPI:   George Owens is a 89 y.o. gentleman with history of pancreatic cancer s/p distal pancreatectomy and splenectomy here with chief complaint of chest pressure which he described as "an elephant sitting on my chest". He was noted to have elevated liver enzymes and subsequent imaging led to likely "biliary sludge" in the CBD in gallstones in the gallbladder including the neck of the gallbladder. No fevers or chills or classic biliary type pain. He is completely asymptomatic currently after pain medications and IV antibiotics.   Past Medical History:  Diagnosis Date   Allergy    Aneurysm of abdominal aorta (HCC)    Basal cell carcinoma    right temple 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    Past Surgical History:  Procedure Laterality Date   BRONCHIAL BRUSHINGS  11/01/2022   Procedure: BRONCHIAL BRUSHINGS;  Surgeon: Raechel Chute, MD;  Location: [facility];  Service: Pulmonary;;   BRONCHIAL NEEDLE ASPIRATION BIOPSY  11/01/2022   Procedure: BRONCHIAL NEEDLE ASPIRATION BIOPSIES;  Surgeon: Raechel Chute, MD;  Location: [facility];  Service: Pulmonary;;   BRONCHIAL WASHINGS  11/01/2022   Procedure: BRONCHIAL WASHINGS;  Surgeon: Raechel Chute, MD;  Location: [facility] ENDOSCOPY;  Service: Pulmonary;;   CATARACT EXTRACTION Right    CATARACT EXTRACTION W/PHACO Left 12/26/2017   Procedure: CATARACT EXTRACTION PHACO AND INTRAOCULAR LENS PLACEMENT (IOC)  COMPLICATED LEFT;  Surgeon: Lockie Mola, MD;  Location: [facility];   Service: Ophthalmology;  Laterality: Left;  MALYUGIN VISION BLUE   colonoscopy     ESOPHAGOGASTRODUODENOSCOPY (EGD) WITH PROPOFOL     EYE MUSCLE SURGERY Left    IR KYPHO THORACIC WITH BONE BIOPSY  11/11/2021   PORTA CATH INSERTION N/A 10/19/2020   Procedure: PORTA CATH INSERTION;  Surgeon: Annice Needy, MD;  Location: [facility] INVASIVE CV LAB;  Service: Cardiovascular;  Laterality: N/A;   TONSILLECTOMY AND ADENOIDECTOMY      Family History  Problem Relation Age of Onset   Stroke Mother    Arthritis Father    Heart disease Father    Brain cancer Brother        1/2 brother (possibly related to mother NOT related to patient)   Cancer Cousin        unk type   Lymphoma Cousin        vs leukemia   Colon cancer Neg Hx    Esophageal cancer Neg Hx    Pancreatic cancer Neg Hx    Stomach cancer Neg Hx    Liver disease Neg Hx     Social History   Tobacco Use   Smoking status: Never   Smokeless tobacco: Never  Vaping Use   Vaping status: Never Used  Substance Use Topics   Alcohol use: No   Drug use: No    Prior to Admission medications   Medication Sig Start Date End Date Taking? Authorizing Provider  acetaminophen (TYLENOL) 500 MG tablet Take 1,000 mg by mouth every 6 (six) hours as needed for moderate pain.    [provider]  Blood Glucose Monitoring Suppl (ONETOUCH VERIO) w/Device KIT Please use to check blood sugars once daily. Dx code: E11.65 08/09/22   [name], MD  Blood Glucose Monitoring Suppl DEVI 1 each by Does not apply route daily. May substitute to any manufacturer covered by patient's insurance. 08/03/22   [name], MD  Calcium Carb-Cholecalciferol (CALCIUM 600 + D PO) Take 1 tablet by mouth in the morning.    [provider]  cholecalciferol (VITAMIN D3) 25 MCG (1000 UNIT) tablet Take 1,000 Units by mouth in the morning.    [provider]  Cranberry 500 MG CAPS Take 500 mg by mouth in the morning.    [provider]   denosumab (PROLIA) 60 MG/ML SOSY injection Inject 60 mg into the skin every 6 (six) months. 09/23/22   [name], MD  Glucose Blood (BLOOD GLUCOSE TEST STRIPS) STRP May substitute to any manufacturer covered by patient's insurance.  Use to  check blood sugar once daily at variable times 08/03/22   [name], MD  glucose blood (ONETOUCH VERIO) test strip Use to check blood sugars one time daily. Dx code: E11.65 08/09/22   [name], MD  hydrocortisone (ANUSOL-HC) 2.5 % rectal cream Place 1 application rectally 2 (two) times daily. Patient taking differently: Place 1 application  rectally 2 (two) times daily as needed (hemorrhoids/irritation.). 03/24/21   Toney Reil, MD  Lancets [facility] ULTRASOFT) lancets Use to check blood sugars once daily. Dx code: E11.65 08/09/22   [name], MD  loratadine (CLARITIN) 10 MG tablet Take 10 mg by mouth daily as needed for allergies.    [provider]  metoprolol succinate (TOPROL-XL) 25 MG 24 hr tablet Take 25 mg by mouth in the morning.    [provider]  Misc Natural Products (JOINT HEALTH PO) Take 1 tablet by mouth every evening.    [provider]  naproxen sodium (ALEVE) 220 MG tablet Take 220 mg by mouth daily as needed.    [provider]  omeprazole (PRILOSEC) 40 MG capsule TAKE 1 CAPSULE (40 MG TOTAL) BY MOUTH DAILY. 04/26/22   Toney Reil, MD  Polyethyl Glycol-Propyl Glycol (SYSTANE OP) Place 1 drop into both eyes 2 (two) times daily as needed (dry/irritated eyes.).    [provider]  Ranibizumab (LUCENTIS IO) Place 1 Dose into the right eye See admin instructions. Every 14 weeks    [provider]  tamsulosin (FLOMAX) 0.4 MG CAPS capsule 1 capsule daily Patient taking differently: Take 0.4 mg by mouth every evening. 10/24/22   [name], MD  vitamin B-12 (CYANOCOBALAMIN) 1000 MCG tablet Take 1,000 mcg by mouth every evening.    [provider]     Current Facility-Administered Medications  Medication Dose Route Frequency Provider Last Rate Last Admin   fentaNYL (SUBLIMAZE) injection 50 mcg  50 mcg Intravenous Q1H PRN Merwyn Katos, MD       Current Outpatient Medications  Medication Sig Dispense Refill   acetaminophen (TYLENOL) 500 MG tablet Take 1,000 mg by mouth every 6 (six) hours as needed for moderate pain.     Blood Glucose Monitoring Suppl (ONETOUCH VERIO) w/Device KIT Please use to check blood sugars once daily. Dx code: E11.65 1 kit 0   Blood Glucose Monitoring Suppl DEVI 1 each by  Does not apply route daily. May substitute to any manufacturer covered by patient's insurance. 1 each 0   Calcium Carb-Cholecalciferol (CALCIUM 600 + D PO) Take 1 tablet by mouth in the morning.     cholecalciferol (VITAMIN D3) 25 MCG (1000 UNIT) tablet Take 1,000 Units by mouth in the morning.     Cranberry 500 MG CAPS Take 500 mg by mouth in the morning.     denosumab (PROLIA) 60 MG/ML SOSY injection Inject 60 mg into the skin every 6 (six) months. 1 mL 1   Glucose Blood (BLOOD GLUCOSE TEST STRIPS) STRP May substitute to any manufacturer covered by patient's insurance.  Use to  check blood sugar once daily at variable times 100 strip 0   glucose blood (ONETOUCH VERIO) test strip Use to check blood sugars one time daily. Dx code: E11.65 100 each 2   hydrocortisone (ANUSOL-HC) 2.5 % rectal cream Place 1 application rectally 2 (two) times daily. (Patient taking differently: Place 1 application  rectally 2 (two) times daily as needed (hemorrhoids/irritation.).) 30 g 0   Lancets (ONETOUCH ULTRASOFT) lancets Use to check blood sugars once daily. Dx code: E11.65 100 each 2   loratadine (CLARITIN) 10 MG tablet Take 10 mg by mouth daily as needed for allergies.     metoprolol succinate (TOPROL-XL) 25 MG 24 hr tablet Take 25 mg by mouth in the morning.     Misc Natural Products (JOINT HEALTH PO) Take 1 tablet by mouth every evening.     naproxen sodium  (ALEVE) 220 MG tablet Take 220 mg by mouth daily as needed.     omeprazole (PRILOSEC) 40 MG capsule TAKE 1 CAPSULE (40 MG TOTAL) BY MOUTH DAILY. 90 capsule 3   Polyethyl Glycol-Propyl Glycol (SYSTANE OP) Place 1 drop into both eyes 2 (two) times daily as needed (dry/irritated eyes.).     Ranibizumab (LUCENTIS IO) Place 1 Dose into the right eye See admin instructions. Every 14 weeks     tamsulosin (FLOMAX) 0.4 MG CAPS capsule 1 capsule daily (Patient taking differently: Take 0.4 mg by mouth every evening.) 90 capsule 1   vitamin B-12 (CYANOCOBALAMIN) 1000 MCG tablet Take 1,000 mcg by mouth every evening.     Facility-Administered Medications Ordered in Other Encounters  Medication Dose Route Frequency Provider Last Rate Last Admin   heparin lock flush 100 UNIT/ML injection            heparin lock flush 100 unit/mL  500 Units Intravenous Once [name], MD        Allergies as of 01/10/2023 - Review Complete 01/10/2023  Allergen Reaction Noted   Kenalog [triamcinolone acetonide]  12/13/2012     Review of Systems:    All systems reviewed and negative except where noted in HPI.  Review of Systems  Constitutional:  Negative for chills, fever and malaise/fatigue.  Respiratory:  Negative for shortness of breath.   Cardiovascular:  Negative for chest pain.  Gastrointestinal:  Positive for abdominal pain. Negative for blood in stool, constipation, diarrhea and melena.  Musculoskeletal:  Negative for joint pain.  Skin:  Negative for rash.  Neurological:  Negative for focal weakness.  All other systems reviewed and are negative.      Physical Exam:  Vital signs in last 24 hours: Temp:  [98.1 F (36.7 C)] 98.1 F (36.7 C) (07/23 0839) Pulse Rate:  [64-83] 69 (07/23 1700) Resp:  [15-23] 17 (07/23 1700) BP: (103-143)/(44-73) 103/44 (07/23 1700) SpO2:  [94 %-100 %] 94 % (  07/23 1700) Weight:  [80.3 kg] 80.3 kg (07/23 0841)   General:   Pleasant in NAD Head:  Normocephalic and  atraumatic. Eyes:   No icterus.   Conjunctiva pink. Mouth: Mucosa pink moist, no lesions. Neck:  Supple; no masses felt Lungs:  No respiratory distress Abdomen:   Flat, soft, nondistended, nontender Rectal:  Not performed.  Msk:  No clubbing or cyanosis Neurologic:  Alert and  oriented x4;  No focal deficits Skin:  Warm, dry, pink without significant lesions or rashes. Psych:  Alert and cooperative. Normal affect.  LAB RESULTS: Recent Labs    01/10/23 0844  WBC 12.8*  HGB 14.4  HCT 42.4  PLT 279   BMET Recent Labs    01/10/23 0844  NA 135  K 3.9  CL 98  CO2 27  GLUCOSE 185*  BUN 22  CREATININE 0.78  CALCIUM 9.2   LFT Recent Labs    01/10/23 0844  PROT 7.1  ALBUMIN 3.9  AST 239*  ALT 135*  ALKPHOS 68  BILITOT 1.8*   PT/INR No results for input(s): "LABPROT", "INR" in the last 72 hours.  STUDIES: MR ABDOMEN MRCP W WO CONTAST  Result Date: 01/10/2023 CLINICAL DATA:  Evaluate gallbladder nodularity. EXAM: MRI ABDOMEN WITHOUT AND WITH CONTRAST (INCLUDING MRCP) TECHNIQUE: Multiplanar multisequence MR imaging of the abdomen was performed both before and after the administration of intravenous contrast. Heavily T2-weighted images of the biliary and pancreatic ducts were obtained, and three-dimensional MRCP images were rendered by post processing. CONTRAST:  8mL GADAVIST GADOBUTROL 1 MMOL/ML IV SOLN COMPARISON:  CT from earlier today FINDINGS: Lower chest: Pulmonary nodules are again seen compatible with metastatic disease. The largest is in the posterior right base measuring 1.3 cm, image 8/4. Hepatobiliary: Mild diffuse hepatic steatosis. No focal liver abnormality identified. Cirrhotic morphology of the liver is noted with the hypertrophy of the lateral segment of left hepatic lobe and caudate lobe. Macro lobular contour the liver noted. Stone within the dependent portion of the gallbladder measures 7 mm scratch stones are identified within the gallbladder neck measuring  up to 7 mm. No suspicious enhancing gallbladder lesion identified. Common bile duct measures 8 mm in diameter. Elongated filling, nonenhancing defect within the mid common bile duct is identified measuring 1.6 cm, image 50/20. This does not have the typical appearance of a gallstone in may represent sludge. Pancreas: Status post distal pancreatectomy. No main duct dilatation, inflammation or suspicious mass. Spleen:  Status post splenectomy. Adrenals/Urinary Tract: Normal adrenal glands. No nephrolithiasis, hydronephrosis or suspicious mass. Tiny cyst within upper pole of right kidney measures 7 mm. No follow-up imaging recommended. Similarly there is a simple cyst off the inferior pole of left kidney measuring 1 cm, image 1228/4. No follow-up imaging recommended. Stomach/Bowel: Visualized portions within the abdomen are unremarkable. Vascular/Lymphatic: Normal caliber of the abdominal aorta. Aortic atherosclerotic calcifications. 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 No abdominal adenopathy. Other:  No ascites or focal fluid collections. Musculoskeletal: Moderate compression deformity is again seen involving the T9 and L2 vertebra. No enhancing bone lesions identified. IMPRESSION: 1. Cholelithiasis. No suspicious enhancing gallbladder lesion identified. 2. Elongated filling defect within the mid common bile duct measuring 1.6 cm. This does not have the typical appearance of a gallstone and may represent sludge. 3. Cirrhotic morphology of the liver. No focal liver abnormality identified. 4. Pulmonary nodules compatible with metastatic disease. 5. Status post distal pancreatectomy and splenectomy. 6. 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 7.  Aortic Atherosclerosis (ICD10-I70.0). Electronically Signed   By: Signa Kell M.D.   On: 01/10/2023 15:54   US ABDOMEN LIMITED RUQ (LIVER/GB)  Result Date: 01/10/2023 CLINICAL DATA:  Transaminitis. Previous partial pancreatic  resection for neoplasm EXAM: ULTRASOUND ABDOMEN LIMITED RIGHT UPPER QUADRANT COMPARISON:  None Available. FINDINGS: Gallbladder: Distended gallbladder with some nodularity posteriorly without clear shadowing. These could be nonshadowing stones versus other nodules are polyps. Common bile duct: Diameter: 3 mm Liver: Diffusely echogenic hepatic parenchyma consistent with some fatty liver infiltration. Please correlate with the prior contrast CT scan. Portal vein is patent on color Doppler imaging with normal direction of blood flow towards the liver. Other: None. IMPRESSION: Distended gallbladder with some nodularity posteriorly along the wall. Not clearly stones. Recommend further workup when appropriate such as with and without contrast MRI. No biliary ductal dilatation. Electronically Signed   By: Karen Kays M.D.   On: 01/10/2023 13:14   CT ABDOMEN PELVIS W CONTRAST  Result Date: 01/10/2023 CLINICAL DATA:  Abdominal pain. Transaminitis. History of pancreatic cancer. Pathologic spinal fracture. EXAM: CT ABDOMEN AND PELVIS WITH CONTRAST TECHNIQUE: Multidetector CT imaging of the abdomen and pelvis was performed using the standard protocol following bolus administration of intravenous contrast. RADIATION DOSE REDUCTION: This exam was performed according to the departmental dose-optimization program which includes automated exposure control, adjustment of the mA and/or kV according to patient size and/or use of iterative reconstruction technique. CONTRAST:  OMNIPAQUE IOHEXOL 300 MG/ML  SOLN COMPARISON:  CT 10/07/2022 abdomen pelvis.  Chest CT 10/27/2022. FINDINGS: Lower chest: Multiple pulmonary nodules again identified consistent with known metastatic disease. Example nodule right lung base posteromedial with the transverse dimension today of 12 mm. On the prior abdomen pelvis CT 7 mm in the more recent chest CT 11 mm. Hepatobiliary: Stable punctate calcification in the inferior aspect of the right hepatic  lobe. No other space-occupying liver lesion at this time. Patent portal vein. Gallbladder distended with some gallbladder wall thickening and some dependent nodularity. Please correlate for any known history. Pancreas: Surgical changes of distal pancreatectomy. Residual pancreatic head and neck with some atrophy. No obvious mass in this location. Spleen: Spleen is surgically absent. Area of fat necrosis in the surgical bed. Adrenals/Urinary Tract: Adrenal glands are preserved. Stable Bosniak 1 exophytic lower pole lateral left-sided renal cyst. No enhancing renal mass or collecting system dilatation. No specific imaging follow-up of the benign cysts. Similar upper pole focus on the right. The ureters have normal course and caliber down to the bladder. Preserved contours of the urinary bladder. Stomach/Bowel: No oral contrast. Large bowel is of normal course and caliber with scattered stool. Normal appendix. Stomach and small bowel are nondilated. Vascular/Lymphatic: Normal caliber IVC. Diffuse vascular calcifications along the aorta as some noncalcified plaque. 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 Similar to previous when adjusting for technique. No developing new lymph node enlargement identified in the abdomen and pelvis. Reproductive: Radiation seeds along the prostate. Other: No free air or free fluid. Musculoskeletal: Curvature of the spine with degenerative changes. Degenerative changes of the pelvis. Moderate compression of the superior endplate of L2 is stable. More significant compression is seen at the edge of the imaging field, incompletely, at T9. Stable sclerotic lesion along the right femur. 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 Distended gallbladder with stones and possible wall thickening. If there is further concern of acute gallbladder pathology, an ultrasound may  be useful as the next  step in the workup. 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 Electronically Signed   By: Karen Kays M.D.   On: 01/10/2023 11:05   DG Chest Port 1 View  Result Date: 01/10/2023 CLINICAL DATA:  chest pain EXAM: PORTABLE CHEST 1 VIEW COMPARISON:  CXR 11/01/22 FINDINGS: Right-sided chest port in place with tip at cavoatrial junction. No pleural effusion. No pneumothorax. Unchanged cardiac and mediastinal contours. No focal airspace opacity. No radiographically apparent displaced rib fractures. Visualized upper abdomen is unremarkable. IMPRESSION: No focal airspace opacity Electronically Signed   By: Lorenza Cambridge M.D.   On: 01/10/2023 09:04       Impression / Plan:   89 y/o gentleman with history of pancreatic cancer s/p distal pancreatectomy and splenectomy here with chief complaint of chest pressure which he described as "an elephant sitting on my chest" with findings of biliary sludge on MRCP and slightly elevated liver enzymes. No acute indication for ERCP. Will recommend gentle hydration and repeating his liver enzymes in the morning. If they are improved then no indication for ERCP. If they dramatically worsen then he would need transfer for potential ERCP as the endoscopist here at [facility] who performs them is not here this week.  - IV fluids - advance diet as tolerated - IV antibiotics - repeat CMP in the morning  Will continue to follow, please call with any questions or concerns.  Merlyn Lot MD, MPH [facility]

## 2023-01-10 NOTE — H&P (Signed)
[place]   PATIENT NAME: George Owens    MR#:  [id number]  DATE OF BIRTH:  18-Jun-1932  DATE OF ADMISSION:  01/10/2023  PRIMARY CARE PHYSICIAN: [name], MD   Patient is coming from: Home  REQUESTING/REFERRING PHYSICIAN: Trinna Post, MD  CHIEF COMPLAINT:   Chief Complaint  Patient presents with   Chest Pain    HISTORY OF PRESENT ILLNESS:  George Owens is a 89 y.o. male with medical history significant for liver cirrhosis, GERD, hypertension, dyslipidemia, peripheral neuropathy, pancreatic cancer status post pancreatic tail resection with reported lung metastasis, who presented to the emergency room with acute onset of substernal chest and epigastric abdominal pain that started this morning with associated nausea and dry heaves.  He denied any fever or chills.  No dyspnea or cough or wheezing.  He denies any radiation to his pain.  No dysuria, oliguria or hematuria or flank pain.  ED Course: When he came to the ER, BP was 132/58 with otherwise normal vital signs.  Labs revealed a blood glucose of 185 with a AST of 239 and ALT of 135 above previous normal levels in April, total bili 1.8.  BNP was 137.6.  High sensitive troponin was 6 and later 5.  CBC showed leukocytosis 12.8 with neutral failure and a blood glucose was 185.. EKG as reviewed by me : EKG showed normal sinus rhythm with a rate of 71 with PVCs and Q waves anteroseptally. Imaging: Portable chest x-ray showed no acute cardiopulmonary disease. Abdominal and pelvic CT scan revealed the following: Again stable surgical changes of distal pancreatectomy and splenectomy.   Distended gallbladder with stones and possible wall thickening. If there is further concern of acute gallbladder pathology, an ultrasound may be useful as the next step in the workup.   Known pulmonary metastases.   Stable right renal artery aneurysm.  Right upper quadrant ultrasound revealed the following: Distended gallbladder with  some nodularity posteriorly along the wall. Not clearly stones. Recommend further workup when appropriate such as with and without contrast MRI.  MRCP revealed the following: 1. Cholelithiasis. No suspicious enhancing gallbladder lesion identified. 2. Elongated filling defect within the mid common bile duct measuring 1.6 cm. This does not have the typical appearance of a gallstone and may represent sludge. 3. Cirrhotic morphology of the liver. No focal liver abnormality identified. 4. Pulmonary nodules compatible with metastatic disease. 5. 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 7.  Aortic Atherosclerosis.  Contact was made with Dr. Mia Creek and Dr. Maia Plan.  Contact was made with [facility] by Dr. Adaline Sill as ERCP is not available here, and he was accepted for transfer as he was not felt to need urgent ERCP at this time.  Contact was made with [facility] by Dr. Maia Plan but the patient refused transfer to [facility].  The patient will therefore be admitted to medical bed here for further evaluation and management. PAST MEDICAL HISTORY:   Past Medical History:  Diagnosis Date   Allergy    Aneurysm of abdominal aorta (HCC)    Basal cell carcinoma    right temple 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    PAST SURGICAL HISTORY:   Past Surgical History:  Procedure Laterality Date   BRONCHIAL BRUSHINGS  11/01/2022   Procedure: BRONCHIAL BRUSHINGS;  Surgeon: Raechel Chute, MD;  Location: [facility] ENDOSCOPY;  Service: Pulmonary;;   BRONCHIAL NEEDLE ASPIRATION BIOPSY  11/01/2022   Procedure: BRONCHIAL NEEDLE ASPIRATION BIOPSIES;  Surgeon: Raechel Chute, MD;  Location: [facility] ENDOSCOPY;  Service: Pulmonary;;   BRONCHIAL WASHINGS  11/01/2022   Procedure: BRONCHIAL WASHINGS;  Surgeon: Raechel Chute, MD;  Location: [facility] ENDOSCOPY;  Service: Pulmonary;;   CATARACT EXTRACTION Right    CATARACT EXTRACTION W/PHACO Left 12/26/2017   Procedure: CATARACT EXTRACTION PHACO AND INTRAOCULAR LENS PLACEMENT (IOC)  COMPLICATED LEFT;  Surgeon: Lockie Mola, MD;  Location: [facility];  Service: Ophthalmology;  Laterality: Left;  MALYUGIN VISION BLUE   colonoscopy     ESOPHAGOGASTRODUODENOSCOPY (EGD) WITH PROPOFOL     EYE MUSCLE SURGERY Left    IR KYPHO THORACIC WITH BONE BIOPSY  11/11/2021   PORTA CATH INSERTION N/A 10/19/2020   Procedure: PORTA CATH INSERTION;  Surgeon: Annice Needy, MD;  Location: [facility] INVASIVE CV LAB;  Service: Cardiovascular;  Laterality: N/A;   TONSILLECTOMY AND ADENOIDECTOMY      SOCIAL HISTORY:   Social History   Tobacco Use   Smoking status: Never   Smokeless tobacco: Never  Substance Use Topics   Alcohol use: No    FAMILY HISTORY:   Family History  Problem Relation Age of Onset   Stroke Mother    Arthritis Father    Heart disease Father    Brain cancer Brother        1/2 brother (possibly related to mother NOT related to patient)   Cancer Cousin        unk type   Lymphoma Cousin        vs leukemia   Colon cancer Neg Hx    Esophageal cancer Neg Hx    Pancreatic cancer Neg Hx    Stomach cancer Neg Hx    Liver disease Neg Hx     DRUG ALLERGIES:   Allergies  Allergen Reactions   Kenalog [Triamcinolone Acetonide]     Blindness X 3 Days injection    REVIEW OF SYSTEMS:   ROS As per history of present illness. All pertinent systems were reviewed above. Constitutional, HEENT, cardiovascular, respiratory, GI, GU, musculoskeletal, neuro, psychiatric, endocrine, integumentary and hematologic systems were reviewed and are otherwise negative/unremarkable except for positive findings mentioned above in  the HPI.   MEDICATIONS AT HOME:   Prior to Admission medications   Medication Sig Start Date End Date Taking? Authorizing Provider  acetaminophen (TYLENOL) 500 MG tablet Take 1,000 mg by mouth every 6 (six) hours as needed for moderate pain.    [provider]  Blood Glucose Monitoring Suppl (ONETOUCH VERIO) w/Device KIT Please use to check blood sugars once daily. Dx code: E11.65 08/09/22   [name], MD  Blood Glucose Monitoring Suppl DEVI 1 each by Does not apply route daily. May substitute to any manufacturer covered by patient's insurance. 08/03/22   [name], MD  Calcium Carb-Cholecalciferol (CALCIUM 600 + D PO) Take 1 tablet by mouth in the morning.    [provider]  cholecalciferol (VITAMIN D3) 25 MCG (1000 UNIT) tablet Take 1,000 Units by mouth in the morning.    [provider]  Cranberry 500 MG CAPS Take 500 mg by mouth in  the morning.    [provider]  denosumab (PROLIA) 60 MG/ML SOSY injection Inject 60 mg into the skin every 6 (six) months. 09/23/22   [name], MD  Glucose Blood (BLOOD GLUCOSE TEST STRIPS) STRP May substitute to any manufacturer covered by patient's insurance.  Use to  check blood sugar once daily at variable times 08/03/22   [name], MD  glucose blood (ONETOUCH VERIO) test strip Use to check blood sugars one time daily. Dx code: E11.65 08/09/22   [name], MD  hydrocortisone (ANUSOL-HC) 2.5 % rectal cream Place 1 application rectally 2 (two) times daily. Patient taking differently: Place 1 application  rectally 2 (two) times daily as needed (hemorrhoids/irritation.). 03/24/21   Toney Reil, MD  Lancets [facility] ULTRASOFT) lancets Use to check blood sugars once daily. Dx code: E11.65 08/09/22   [name], MD  loratadine (CLARITIN) 10 MG tablet Take 10 mg by mouth daily as needed for allergies.    [provider]  metoprolol succinate (TOPROL-XL) 25 MG 24 hr tablet Take 25 mg  by mouth in the morning.    [provider]  Misc Natural Products (JOINT HEALTH PO) Take 1 tablet by mouth every evening.    [provider]  naproxen sodium (ALEVE) 220 MG tablet Take 220 mg by mouth daily as needed.    [provider]  omeprazole (PRILOSEC) 40 MG capsule TAKE 1 CAPSULE (40 MG TOTAL) BY MOUTH DAILY. 04/26/22   Toney Reil, MD  Polyethyl Glycol-Propyl Glycol (SYSTANE OP) Place 1 drop into both eyes 2 (two) times daily as needed (dry/irritated eyes.).    [provider]  Ranibizumab (LUCENTIS IO) Place 1 Dose into the right eye See admin instructions. Every 14 weeks    [provider]  tamsulosin (FLOMAX) 0.4 MG CAPS capsule 1 capsule daily Patient taking differently: Take 0.4 mg by mouth every evening. 10/24/22   [name], MD  vitamin B-12 (CYANOCOBALAMIN) 1000 MCG tablet Take 1,000 mcg by mouth every evening.    [provider]      VITAL SIGNS:  Blood pressure 129/69, pulse 74, temperature 98.2 F (36.8 C), temperature source Oral, resp. rate 18, height 6\' 3"  (1.905 m), weight 80.3 kg, SpO2 99%.  PHYSICAL EXAMINATION:  Physical Exam  GENERAL:  89 y.o.-year-old Caucasian male patient lying in the bed with no acute distress.  EYES: Pupils equal, round, reactive to light and accommodation. No scleral icterus. Extraocular muscles intact.  HEENT: Head atraumatic, normocephalic. Oropharynx and nasopharynx clear.  NECK:  Supple, no jugular venous distention. No thyroid enlargement, no tenderness.  LUNGS: Normal breath sounds bilaterally, no wheezing, rales,rhonchi or crepitation. No use of accessory muscles of respiration.  CARDIOVASCULAR: Regular rate and rhythm, S1, S2 normal. No murmurs, rubs, or gallops.  ABDOMEN: Soft, nondistended, with mild epigastric and right upper quadrant tenderness without rebound tenderness guarding or rigidity.  Negative Murphy sign.. Bowel sounds present. No organomegaly or mass.   EXTREMITIES: No pedal edema, cyanosis, or clubbing.  NEUROLOGIC: Cranial nerves II through XII are intact. Muscle strength 5/5 in all extremities. Sensation intact. Gait not checked.  PSYCHIATRIC: The patient is alert and oriented x 3.  Normal affect and good eye contact. SKIN: No obvious rash, lesion, or ulcer.   LABORATORY PANEL:   CBC Recent Labs  Lab 01/10/23 0844  WBC 12.8*  HGB 14.4  HCT 42.4  PLT 279   ------------------------------------------------------------------------------------------------------------------  Chemistries  Recent Labs  Lab  01/10/23 0844  NA 135  K 3.9  CL 98  CO2 27  GLUCOSE 185*  BUN 22  CREATININE 0.78  CALCIUM 9.2  AST 239*  ALT 135*  ALKPHOS 68  BILITOT 1.8*   ------------------------------------------------------------------------------------------------------------------  Cardiac Enzymes No results for input(s): "TROPONINI" in the last 168 hours. ------------------------------------------------------------------------------------------------------------------  RADIOLOGY:  MR ABDOMEN MRCP W WO CONTAST  Result Date: 01/10/2023 CLINICAL DATA:  Evaluate gallbladder nodularity. EXAM: MRI ABDOMEN WITHOUT AND WITH CONTRAST (INCLUDING MRCP) TECHNIQUE: Multiplanar multisequence MR imaging of the abdomen was performed both before and after the administration of intravenous contrast. Heavily T2-weighted images of the biliary and pancreatic ducts were obtained, and three-dimensional MRCP images were rendered by post processing. CONTRAST:  8mL GADAVIST GADOBUTROL 1 MMOL/ML IV SOLN COMPARISON:  CT from earlier today FINDINGS: Lower chest: Pulmonary nodules are again seen compatible with metastatic disease. The largest is in the posterior right base measuring 1.3 cm, image 8/4. Hepatobiliary: Mild diffuse hepatic steatosis. No focal liver abnormality identified. Cirrhotic morphology of the liver is noted with the hypertrophy of the lateral segment of  left hepatic lobe and caudate lobe. Macro lobular contour the liver noted. Stone within the dependent portion of the gallbladder measures 7 mm scratch stones are identified within the gallbladder neck measuring up to 7 mm. No suspicious enhancing gallbladder lesion identified. Common bile duct measures 8 mm in diameter. Elongated filling, nonenhancing defect within the mid common bile duct is identified measuring 1.6 cm, image 50/20. This does not have the typical appearance of a gallstone in may represent sludge. Pancreas: Status post distal pancreatectomy. No main duct dilatation, inflammation or suspicious mass. Spleen:  Status post splenectomy. Adrenals/Urinary Tract: Normal adrenal glands. No nephrolithiasis, hydronephrosis or suspicious mass. Tiny cyst within upper pole of right kidney measures 7 mm. No follow-up imaging recommended. Similarly there is a simple cyst off the inferior pole of left kidney measuring 1 cm, image 1228/4. No follow-up imaging recommended. Stomach/Bowel: Visualized portions within the abdomen are unremarkable. Vascular/Lymphatic: Normal caliber of the abdominal aorta. Aortic atherosclerotic calcifications. 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 No abdominal adenopathy. Other:  No ascites or focal fluid collections. Musculoskeletal: Moderate compression deformity is again seen involving the T9 and L2 vertebra. No enhancing bone lesions identified. IMPRESSION: 1. Cholelithiasis. No suspicious enhancing gallbladder lesion identified. 2. Elongated filling defect within the mid common bile duct measuring 1.6 cm. This does not have the typical appearance of a gallstone and may represent sludge. 3. Cirrhotic morphology of the liver. No focal liver abnormality identified. 4. Pulmonary nodules compatible with metastatic disease. 5. 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 7.  Aortic Atherosclerosis  (ICD10-I70.0). Electronically Signed   By: Signa Kell M.D.   On: 01/10/2023 15:54   US ABDOMEN LIMITED RUQ (LIVER/GB)  Result Date: 01/10/2023 CLINICAL DATA:  Transaminitis. Previous partial pancreatic resection for neoplasm EXAM: ULTRASOUND ABDOMEN LIMITED RIGHT UPPER QUADRANT COMPARISON:  None Available. FINDINGS: Gallbladder: Distended gallbladder with some nodularity posteriorly without clear shadowing. These could be nonshadowing stones versus other nodules are polyps. Common bile duct: Diameter: 3 mm Liver: Diffusely echogenic hepatic parenchyma consistent with some fatty liver infiltration. Please correlate with the prior contrast CT scan. Portal vein is patent on color Doppler imaging with normal direction of blood flow towards the liver. Other: None. IMPRESSION: Distended gallbladder with some nodularity posteriorly along the wall. Not clearly stones. Recommend further workup when appropriate such as  with and without contrast MRI. No biliary ductal dilatation. Electronically Signed   By: Karen Kays M.D.   On: 01/10/2023 13:14   CT ABDOMEN PELVIS W CONTRAST  Result Date: 01/10/2023 CLINICAL DATA:  Abdominal pain. Transaminitis. History of pancreatic cancer. Pathologic spinal fracture. EXAM: CT ABDOMEN AND PELVIS WITH CONTRAST TECHNIQUE: Multidetector CT imaging of the abdomen and pelvis was performed using the standard protocol following bolus administration of intravenous contrast. RADIATION DOSE REDUCTION: This exam was performed according to the departmental dose-optimization program which includes automated exposure control, adjustment of the mA and/or kV according to patient size and/or use of iterative reconstruction technique. CONTRAST:  OMNIPAQUE IOHEXOL 300 MG/ML  SOLN COMPARISON:  CT 10/07/2022 abdomen pelvis.  Chest CT 10/27/2022. FINDINGS: Lower chest: Multiple pulmonary nodules again identified consistent with known metastatic disease. Example nodule right lung base  posteromedial with the transverse dimension today of 12 mm. On the prior abdomen pelvis CT 7 mm in the more recent chest CT 11 mm. Hepatobiliary: Stable punctate calcification in the inferior aspect of the right hepatic lobe. No other space-occupying liver lesion at this time. Patent portal vein. Gallbladder distended with some gallbladder wall thickening and some dependent nodularity. Please correlate for any known history. Pancreas: Surgical changes of distal pancreatectomy. Residual pancreatic head and neck with some atrophy. No obvious mass in this location. Spleen: Spleen is surgically absent. Area of fat necrosis in the surgical bed. Adrenals/Urinary Tract: Adrenal glands are preserved. Stable Bosniak 1 exophytic lower pole lateral left-sided renal cyst. No enhancing renal mass or collecting system dilatation. No specific imaging follow-up of the benign cysts. Similar upper pole focus on the right. The ureters have normal course and caliber down to the bladder. Preserved contours of the urinary bladder. Stomach/Bowel: No oral contrast. Large bowel is of normal course and caliber with scattered stool. Normal appendix. Stomach and small bowel are nondilated. Vascular/Lymphatic: Normal caliber IVC. Diffuse vascular calcifications along the aorta as some noncalcified plaque. 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 Similar to previous when adjusting for technique. No developing new lymph node enlargement identified in the abdomen and pelvis. Reproductive: Radiation seeds along the prostate. Other: No free air or free fluid. Musculoskeletal: Curvature of the spine with degenerative changes. Degenerative changes of the pelvis. Moderate compression of the superior endplate of L2 is stable. More significant compression is seen at the edge of the imaging field, incompletely, at T9. Stable sclerotic lesion along the right femur. IMPRESSION: Again stable  surgical changes of distal pancreatectomy and splenectomy. Distended gallbladder with stones and possible wall thickening. If there is further concern of acute gallbladder pathology, an ultrasound may be useful as the next step in the workup. 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 Electronically Signed   By: Karen Kays M.D.   On: 01/10/2023 11:05   DG Chest Port 1 View  Result Date: 01/10/2023 CLINICAL DATA:  chest pain EXAM: PORTABLE CHEST 1 VIEW COMPARISON:  CXR 11/01/22 FINDINGS: Right-sided chest port in place with tip at cavoatrial junction. No pleural effusion. No pneumothorax. Unchanged cardiac and mediastinal contours. No focal airspace opacity. No radiographically apparent displaced rib fractures. Visualized upper abdomen is unremarkable. IMPRESSION: No focal airspace opacity Electronically Signed   By: Lorenza Cambridge M.D.   On: 01/10/2023 09:04      IMPRESSION AND PLAN:  Assessment and Plan: * Cholelithiasis - The patient has associated substernal right upper quadrant abdominal pain. -  He will be admitted to a medical bed. - Pain management will be provided. - Will monitor LFTs. - GI consultation and general surgery consult were obtained as mentioned above. - Dr. Mia Creek and Dr. Maia Plan have evaluated the patient and will follow with [place].  Biliary sludge - This is in the common bile duct with some dilatation. - We will monitor LFTs. - Conservative management otherwise as above. - If he has worsening jaundice, he may need to be transferred if he agrees then.  Essential hypertension - We will continue Toprol-XL.  GERD without esophagitis - We will continue PPI therapy.  BPH (benign prostatic hyperplasia) - We will continue Flomax   DVT prophylaxis: Lovenox.  Advanced Care Planning:  Code Status: full code.  Family Communication:  The plan of care was discussed in details with the patient (and family). I answered all questions. The patient agreed  to proceed with the above mentioned plan. Further management will depend upon hospital course. Disposition Plan: Back to previous home environment Consults called: GI and surgery All the records are reviewed and case discussed with ED provider.  Status is: Inpatient    At the time of the admission, it appears that the appropriate admission status for this patient is inpatient.  This is judged to be reasonable and necessary in order to provide the required intensity of service to ensure the patient's safety given the presenting symptoms, physical exam findings and initial radiographic and laboratory data in the context of comorbid conditions.  The patient requires inpatient status due to high intensity of service, high risk of further deterioration and high frequency of surveillance required.  I certify that at the time of admission, it is my clinical judgment that the patient will require inpatient hospital care extending more than 2 midnights.                            Dispo: The patient is from: Home              Anticipated d/c is to: Home              Patient currently is not medically stable to d/c.              Difficult to place patient: No  Hannah Beat M.D on 01/10/2023 at 8:23 PM  [facility]   From 7 PM-7 AM, contact night-coverage [email or web address]  CC: Primary care physician; [name], MD

## 2023-01-10 NOTE — Assessment & Plan Note (Signed)
-   We will continue PPI therapy 

## 2023-01-10 NOTE — ED Notes (Signed)
Pt is refusing transfer - MD Mansy and Cintron-Diaz notified.

## 2023-01-10 NOTE — Consult Note (Signed)
SURGICAL CONSULTATION NOTE   HISTORY OF PRESENT ILLNESS (HPI):  89 y.o. male presented to [facility] ED for evaluation of substernal chest pain since this morning. Patient reports pain mainly on the epigastric area and mid chest.  Pain started suddenly.  He pain was at the most 5 out of 10.  Patient cannot identify any radiation.  Patient cannot identify any alleviating or aggravating factors.  Patient denies shortness of breath.  Patient denies any nausea or vomiting.  At the ED he was found with no leukocytosis.  There was elevated bilirubin.  He had an ultrasound that shows cholelithiasis with concern of gallbladder nodules.  The was followed by MRCP.  MRCP negative for gallbladder nodules but it shows a filling defect in the common bile duct of 1.6 cm.  Surgery is consulted by Dr. Danie Binder in this context for evaluation and management of cholelithiasis.  PAST MEDICAL HISTORY (PMH):  Past Medical History:  Diagnosis Date   Allergy    Aneurysm of abdominal aorta (HCC)    Basal cell carcinoma    right temple 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     PAST SURGICAL HISTORY (PSH):  Past Surgical History:  Procedure Laterality Date   BRONCHIAL BRUSHINGS  11/01/2022   Procedure: BRONCHIAL BRUSHINGS;  Surgeon: Raechel Chute, MD;  Location: [facility] ENDOSCOPY;  Service: Pulmonary;;   BRONCHIAL NEEDLE ASPIRATION BIOPSY  11/01/2022   Procedure: BRONCHIAL NEEDLE ASPIRATION BIOPSIES;  Surgeon: Raechel Chute, MD;  Location: [facility] ENDOSCOPY;  Service: Pulmonary;;   BRONCHIAL WASHINGS  11/01/2022   Procedure: BRONCHIAL WASHINGS;  Surgeon: Raechel Chute, MD;  Location: [facility] ENDOSCOPY;  Service: Pulmonary;;   CATARACT  EXTRACTION Right    CATARACT EXTRACTION W/PHACO Left 12/26/2017   Procedure: CATARACT EXTRACTION PHACO AND INTRAOCULAR LENS PLACEMENT (IOC)  COMPLICATED LEFT;  Surgeon: Lockie Mola, MD;  Location: [facility];  Service: Ophthalmology;  Laterality: Left;  MALYUGIN VISION BLUE   colonoscopy     ESOPHAGOGASTRODUODENOSCOPY (EGD) WITH PROPOFOL     EYE MUSCLE SURGERY Left    IR KYPHO THORACIC WITH BONE BIOPSY  11/11/2021   PORTA CATH INSERTION N/A 10/19/2020   Procedure: PORTA CATH INSERTION;  Surgeon: Annice Needy, MD;  Location: [facility] INVASIVE CV LAB;  Service: Cardiovascular;  Laterality: N/A;   TONSILLECTOMY AND ADENOIDECTOMY       MEDICATIONS:  Prior to Admission medications   Medication Sig Start Date End Date Taking? Authorizing Provider  acetaminophen (TYLENOL) 500 MG tablet Take 1,000 mg by mouth every 6 (six) hours as needed for moderate pain.   Yes [provider]  Calcium Carb-Cholecalciferol (CALCIUM 600 + D PO) Take 1 tablet by mouth in the morning.   Yes [provider]  cholecalciferol (VITAMIN D3) 25 MCG (1000 UNIT) tablet Take 1,000 Units by mouth in the morning.   Yes [provider]  Cranberry 500 MG CAPS Take 500 mg by mouth in the morning.   Yes [provider]  loratadine (CLARITIN) 10 MG tablet Take 10 mg by mouth daily as needed for allergies.   Yes [provider]  metoprolol succinate (TOPROL-XL) 25  MG 24 hr tablet Take 25 mg by mouth in the morning.   Yes [provider]  Misc Natural Products (JOINT HEALTH PO) Take 1 tablet by mouth every evening.   Yes [provider]  naproxen sodium (ALEVE) 220 MG tablet Take 220 mg by mouth daily as needed.   Yes [provider]  omeprazole (PRILOSEC) 40 MG capsule TAKE 1 CAPSULE (40 MG TOTAL) BY MOUTH DAILY. 04/26/22  Yes Vanga, Loel Dubonnet, MD  Polyethyl Glycol-Propyl Glycol (SYSTANE OP) Place 1 drop into both eyes 2 (two) times daily as needed  (dry/irritated eyes.).   Yes [provider]  tamsulosin (FLOMAX) 0.4 MG CAPS capsule 1 capsule daily 10/24/22  Yes [name], MD  vitamin B-12 (CYANOCOBALAMIN) 1000 MCG tablet Take 1,000 mcg by mouth every evening.   Yes [provider]  Blood Glucose Monitoring Suppl (ONETOUCH VERIO) w/Device KIT Please use to check blood sugars once daily. Dx code: E11.65 08/09/22   [name], MD  Blood Glucose Monitoring Suppl DEVI 1 each by Does not apply route daily. May substitute to any manufacturer covered by patient's insurance. 08/03/22   [name], MD  denosumab (PROLIA) 60 MG/ML SOSY injection Inject 60 mg into the skin every 6 (six) months. 09/23/22   [name], MD  Glucose Blood (BLOOD GLUCOSE TEST STRIPS) STRP May substitute to any manufacturer covered by patient's insurance.  Use to  check blood sugar once daily at variable times 08/03/22   [name], MD  glucose blood (ONETOUCH VERIO) test strip Use to check blood sugars one time daily. Dx code: E11.65 08/09/22   [name], MD  hydrocortisone (ANUSOL-HC) 2.5 % rectal cream Place 1 application rectally 2 (two) times daily. Patient not taking: Reported on 01/10/2023 03/24/21   Toney Reil, MD  Lancets [facility] ULTRASOFT) lancets Use to check blood sugars once daily. Dx code: E11.65 08/09/22   [name], MD  Ranibizumab (LUCENTIS IO) Place 1 Dose into the right eye See admin instructions. Every 14 weeks Patient not taking: Reported on 01/10/2023    [provider]     ALLERGIES:  Allergies  Allergen Reactions   Kenalog [Triamcinolone Acetonide]     Blindness X 3 Days injection     SOCIAL HISTORY:  Social History   Socioeconomic History   Marital status: Married    Spouse name: Eloise   Number of children: 4   Years of education: Not on file   Highest education level: Not on file  Occupational History   Occupation: retired  Tobacco Use   Smoking status: Never   Smokeless  tobacco: Never  Vaping Use   Vaping status: Never Used  Substance and Sexual Activity   Alcohol use: No   Drug use: No   Sexual activity: Yes    Partners: Female  Other Topics Concern   Not on file  Social History Narrative   Lives with wife in a 5 story retirement home.  He lives on the first floor.  4 year college degree.  Retired Corporate investment banker.  Married 4 years to his first wife's sister.     Social Determinants of Health   Financial Resource Strain: Low Risk  (04/13/2020)   Overall Financial Resource Strain (CARDIA)    Difficulty of Paying Living Expenses: Not hard at all  Food Insecurity: No Food Insecurity (04/10/2019)   Hunger Vital Sign    Worried About Running Out of Food in the Last Year:  Never true    Ran Out of Food in the Last Year: Never true  Transportation Needs: No Transportation Needs (02/26/2021)   Received from [facility], [facility]   [facility] - Transportation    In the past 12 months, has lack of transportation kept you from medical appointments or from getting medications?: No    Lack of Transportation (Non-Medical): No  Physical Activity: Sufficiently Active (04/10/2019)   Exercise Vital Sign    Days of Exercise per Week: 5 days    Minutes of Exercise per Session: 30 min  Stress: No Stress Concern Present (04/13/2020)   [employer] of Occupational Health - Occupational Stress Questionnaire    Feeling of Stress : Not at all  Social Connections: Unknown (04/13/2020)   Social Connection and Isolation Panel [NHANES]    Frequency of Communication with Friends and Family: Not on file    Frequency of Social Gatherings with Friends and Family: Not on file    Attends Religious Services: Not on file    Active Member of Clubs or Organizations: Not on file    Attends Banker Meetings: Not on file    Marital Status: Married  Intimate Partner Violence: Not At Risk (04/10/2019)   Humiliation, Afraid, Rape, and  Kick questionnaire    Fear of Current or Ex-Partner: No    Emotionally Abused: No    Physically Abused: No    Sexually Abused: No      FAMILY HISTORY:  Family History  Problem Relation Age of Onset   Stroke Mother    Arthritis Father    Heart disease Father    Brain cancer Brother        1/2 brother (possibly related to mother NOT related to patient)   Cancer Cousin        unk type   Lymphoma Cousin        vs leukemia   Colon cancer Neg Hx    Esophageal cancer Neg Hx    Pancreatic cancer Neg Hx    Stomach cancer Neg Hx    Liver disease Neg Hx      REVIEW OF SYSTEMS:  Constitutional: denies weight loss, fever, chills, or sweats  Eyes: denies any other vision changes, history of eye injury  ENT: denies sore throat, hearing problems  Respiratory: denies shortness of breath, wheezing  Cardiovascular: denies chest pain, palpitations  Gastrointestinal: Positive abdominal pain, nausea and vomiting Genitourinary: denies burning with urination or urinary frequency Musculoskeletal: denies any other joint pains or cramps  Skin: denies any other rashes or skin discolorations  Neurological: denies any other headache, dizziness, weakness  Psychiatric: denies any other depression, anxiety   All other review of systems were negative   VITAL SIGNS:  Temp:  [98.1 F (36.7 C)-98.2 F (36.8 C)] 98.2 F (36.8 C) (07/23 1958) Pulse Rate:  [64-83] 74 (07/23 1958) Resp:  [13-23] 18 (07/23 1958) BP: (103-143)/(44-73) 129/69 (07/23 1958) SpO2:  [94 %-100 %] 99 % (07/23 2046) Weight:  [80.3 kg] 80.3 kg (07/23 0841)     Height: 6\' 3"  (190.5 cm) Weight: 80.3 kg BMI (Calculated): 22.12   INTAKE/OUTPUT:  This shift: No intake/output data recorded.  Last 2 shifts: @IOLAST2SHIFTS @   PHYSICAL EXAM:  Constitutional:  -- Normal body habitus  -- Awake, alert, and oriented x3  Eyes:  -- Pupils equally round and reactive to light  -- No scleral icterus  Ear, nose, and throat:  -- No jugular  venous distension  Pulmonary:  -- No crackles  -- Equal breath sounds bilaterally -- Breathing non-labored at rest Cardiovascular:  -- S1, S2 present  -- No pericardial rubs Gastrointestinal:  -- Abdomen soft, nontender, non-distended, no guarding or rebound tenderness -- No abdominal masses appreciated, pulsatile or otherwise  Musculoskeletal and Integumentary:  -- Wounds: None appreciated -- Extremities: B/L UE and LE FROM, hands and feet warm, no edema  Neurologic:  -- Motor function: intact and symmetric -- Sensation: intact and symmetric   Labs:     Latest Ref Rng & Units 01/10/2023    8:44 AM 10/14/2022   10:56 AM 06/08/2022   10:52 AM  CBC  WBC 4.0 - 10.5 K/uL 12.8  6.0  6.9   Hemoglobin 13.0 - 17.0 g/dL 30.8  65.7  84.6   Hematocrit 39.0 - 52.0 % 42.4  41.1  39.5   Platelets 150 - 400 K/uL 279  252  266       Latest Ref Rng & Units 01/10/2023    8:44 AM 10/14/2022   10:56 AM 07/29/2022    9:02 AM  CMP  Glucose 70 - 99 mg/dL 962  98  952   BUN 8 - 23 mg/dL 22  23  25    Creatinine 0.61 - 1.24 mg/dL 8.41  3.24  4.01   Sodium 135 - 145 mmol/L 135  136  142   Potassium 3.5 - 5.1 mmol/L 3.9  3.9  4.3   Chloride 98 - 111 mmol/L 98  101  102   CO2 22 - 32 mmol/L 27  28  30    Calcium 8.9 - 10.3 mg/dL 9.2  8.8  9.0   Total Protein 6.5 - 8.1 g/dL 7.1  7.0  6.3   Total Bilirubin 0.3 - 1.2 mg/dL 1.8  0.6  0.6   Alkaline Phos 38 - 126 U/L 68  54  46   AST 15 - 41 U/L 239  30  26   ALT 0 - 44 U/L 135  28  27     Imaging studies:  EXAM: MRI ABDOMEN WITHOUT AND WITH CONTRAST (INCLUDING MRCP)   TECHNIQUE: Multiplanar multisequence MR imaging of the abdomen was performed both before and after the administration of intravenous contrast. Heavily T2-weighted images of the biliary and pancreatic ducts were obtained, and three-dimensional MRCP images were rendered by post processing.   CONTRAST:  8mL GADAVIST GADOBUTROL 1 MMOL/ML IV SOLN   COMPARISON:  CT from earlier  today   FINDINGS: Lower chest: Pulmonary nodules are again seen compatible with metastatic disease. The largest is in the posterior right base measuring 1.3 cm, image 8/4.   Hepatobiliary: Mild diffuse hepatic steatosis. No focal liver abnormality identified. Cirrhotic morphology of the liver is noted with the hypertrophy of the lateral segment of left hepatic lobe and caudate lobe. Macro lobular contour the liver noted.   Stone within the dependent portion of the gallbladder measures 7 mm scratch stones are identified within the gallbladder neck measuring up to 7 mm. No suspicious enhancing gallbladder lesion identified.   Common bile duct measures 8 mm in diameter. Elongated filling, nonenhancing defect within the mid common bile duct is identified measuring 1.6 cm, image 50/20. This does not have the typical appearance of a gallstone in may represent sludge.   Pancreas: Status post distal pancreatectomy. No main duct dilatation, inflammation or suspicious mass.   Spleen:  Status post splenectomy.   Adrenals/Urinary Tract: Normal adrenal glands. No nephrolithiasis, hydronephrosis or suspicious mass. Tiny  cyst within upper pole of right kidney measures 7 mm. No follow-up imaging recommended. Similarly there is a simple cyst off the inferior pole of left kidney measuring 1 cm, image 1228/4. No follow-up imaging recommended.   Stomach/Bowel: Visualized portions within the abdomen are unremarkable.   Vascular/Lymphatic: Normal caliber of the abdominal aorta. Aortic atherosclerotic calcifications. 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 No abdominal adenopathy.   Other:  No ascites or focal fluid collections.   Musculoskeletal: Moderate compression deformity is again seen involving the T9 and L2 vertebra. No enhancing bone lesions identified.   IMPRESSION: 1. Cholelithiasis. No suspicious enhancing gallbladder lesion identified. 2.  Elongated filling defect within the mid common bile duct measuring 1.6 cm. This does not have the typical appearance of a gallstone and may represent sludge. 3. Cirrhotic morphology of the liver. No focal liver abnormality identified. 4. Pulmonary nodules compatible with metastatic disease. 5. 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 7.  Aortic Atherosclerosis (ICD10-I70.0).     Electronically Signed   By: Signa Kell M.D.   On: 01/10/2023 15:54  Assessment/Plan:  89 y.o. male with choledocholithiasis, complicated by pertinent comorbidities including atrial fibrillation, history of pancreatic cancer.  -Elevated bilirubin of 1.8. MRCP with filling defect in the common bile duct of 1.6 cm.  -Patient has indication for ERCP.  -As per GI service, patient was not accepted at [facility]. I contacted [facility] and he was accepted. -Patient declined transfer hoping that choledocholithiasis will resolve by itself. Even though, that is possible if the stone/sludge passes, if it does not, then another transfer attempt will be needed.  -Follow GI recommendations regarding management of choledocholithiasis - I discussed with patient and family that cholecystectomy is not treatment for choledocholithiasis. The goal of treatment at this moment is to clear the common bile duct. Cholecystectomy is for prevention a recurrent episode, not to treat the acute problem.    Gae Gallop, MD

## 2023-01-10 NOTE — ED Triage Notes (Signed)
Pt arrives via [facility] w/ c/o constant chest pressure that started at 0400 that now radiates down into abdomen. Pt denies shortness of breath, does report some nausea with exertion.   Hx pancreatic cancer, hx afib reported during pancreatic surgery Chemo in 2023  EKG unremark VSS 244 cbg 98.8 oral

## 2023-01-10 NOTE — Assessment & Plan Note (Signed)
-   The patient has associated substernal right upper quadrant abdominal pain. - He will be admitted to a medical bed. - Pain management will be provided. - Will monitor LFTs. - GI consultation and general surgery consult were obtained as mentioned above. - Dr. Mia Creek and Dr. Maia Plan have evaluated the patient and will follow with [place].

## 2023-01-10 NOTE — ED Provider Notes (Signed)
Care of this patient assumed from prior physician at 1500 pending MRCP, reevaluation, and disposition. Please see prior physician note for further details.  89 year old male with pancreatic cancer who presented to the ER for epigastric/chest pain.  His CT abdomen pelvis demonstrated distended gallbladder with stones and possible wall thickening.  Right upper quadrant ultrasound showed distended gallbladder with nodularity along the wall.  Case was discussed with Dr. Mia Creek with gastroenterology who recommended MRI/MRCP.  Signed out to me pending this.  This did demonstrate cholelithiasis as well as CBD obstruction secondary to sludge without discrete stone.  This was reviewed with Dr. Mia Creek.  He reported that typically for sludge, patient would not be a candidate for ERCP and he did not require transfer to [facility] at this time, but did recommend discussion with surgery.  Case was reviewed with Dr. Maia Plan who reported a preventative cholecystectomy could be performed, but it would not address his sludge noted on MRCP.  After further discussion with [place] who evaluated the patient at bedside and discussion with the GI team at [facility], he recommended that the patient be admitted at our facility for IV hydration, continued antibiotics, and repeat LFTs tomorrow to determine best next disposition.  Patient has already received Zosyn.  Will reach out to medicine team for admission.

## 2023-01-10 NOTE — ED Notes (Signed)
[facility]ray  [facility]  with  [facility] 

## 2023-01-11 DIAGNOSIS — R1013 Epigastric pain: Secondary | ICD-10-CM | POA: Diagnosis not present

## 2023-01-11 LAB — CBC
HCT: 36.4 % — ABNORMAL LOW (ref 39.0–52.0)
Hemoglobin: 12.8 g/dL — ABNORMAL LOW (ref 13.0–17.0)
MCH: 31.9 pg (ref 26.0–34.0)
MCHC: 35.2 g/dL (ref 30.0–36.0)
MCV: 90.8 fL (ref 80.0–100.0)
Platelets: 238 10*3/uL (ref 150–400)
RBC: 4.01 MIL/uL — ABNORMAL LOW (ref 4.22–5.81)
RDW: 13.6 % (ref 11.5–15.5)
WBC: 11.2 10*3/uL — ABNORMAL HIGH (ref 4.0–10.5)
nRBC: 0 % (ref 0.0–0.2)

## 2023-01-11 LAB — COMPREHENSIVE METABOLIC PANEL
ALT: 164 U/L — ABNORMAL HIGH (ref 0–44)
AST: 142 U/L — ABNORMAL HIGH (ref 15–41)
Albumin: 3.2 g/dL — ABNORMAL LOW (ref 3.5–5.0)
Alkaline Phosphatase: 59 U/L (ref 38–126)
Anion gap: 4 — ABNORMAL LOW (ref 5–15)
BUN: 17 mg/dL (ref 8–23)
CO2: 25 mmol/L (ref 22–32)
Calcium: 7.9 mg/dL — ABNORMAL LOW (ref 8.9–10.3)
Chloride: 107 mmol/L (ref 98–111)
Creatinine, Ser: 0.77 mg/dL (ref 0.61–1.24)
GFR, Estimated: >60 mL/min (ref >60)
Glucose, Bld: 151 mg/dL — ABNORMAL HIGH (ref 70–99)
Potassium: 3.2 mmol/L — ABNORMAL LOW (ref 3.5–5.1)
Sodium: 136 mmol/L (ref 135–145)
Total Bilirubin: 1.9 mg/dL — ABNORMAL HIGH (ref 0.3–1.2)
Total Protein: 5.9 g/dL — ABNORMAL LOW (ref 6.5–8.1)

## 2023-01-11 LAB — BILIRUBIN, DIRECT: Bilirubin, Direct: 0.4 mg/dL — ABNORMAL HIGH (ref 0.0–0.2)

## 2023-01-11 MED ORDER — POTASSIUM CHLORIDE CRYS ER 20 MEQ PO TBCR
40.0000 meq | EXTENDED_RELEASE_TABLET | ORAL | Status: AC
  Administered 2023-01-11 (×2): 40 meq via ORAL
  Filled 2023-01-11 (×2): qty 2

## 2023-01-11 NOTE — Progress Notes (Signed)
Mobility Specialist - Progress Note   01/11/23 1609  Mobility  Activity Ambulated with assistance in room;Ambulated with assistance in hallway;Ambulated with assistance to bathroom  Level of Assistance Standby assist, set-up cues, supervision of patient - no hands on  Assistive Device None (pushing IV pole)  Distance Ambulated (ft) 160 ft  Activity Response Tolerated well  $Mobility charge 1 Mobility  Mobility Specialist Start Time (ACUTE ONLY) 1550  Mobility Specialist Stop Time (ACUTE ONLY) 1601  Mobility Specialist Time Calculation (min) (ACUTE ONLY) 11 min   Pt supine upon entry, utilizing RA. Pt agreeable to OOB amb this date. Pt completed bed mob and STS indep. Pt amb one lap around the NS with supervision, tolerated well. Pt returned to the room, left seated on the commode with instructions to use the call bell when ready.   Zetta Bills Mobility Specialist 01/11/23 4:14 PM

## 2023-01-11 NOTE — TOC CM/SW Note (Signed)
Transition of Care [facility]) - Inpatient Brief Assessment   Patient Details  Name: LLEYTON BYERS MRN: [id number] Date of Birth: January 12, 1932  [facility] CM/SW Contact:    Chapman Fitch, RN Phone Number: 01/11/2023, 9:30 AM   Clinical Narrative:  No insurance listed on file 2024 HTA card in media file.  Rob with patient registration notified via email  Transition of Care Asessment: Insurance and Status: Insurance coverage has been reviewed Patient has primary care physician: Yes     Prior/Current Home Services: No current home services Social Determinants of Health Reivew: SDOH reviewed no interventions necessary Readmission risk has been reviewed: Yes Transition of care needs: no transition of care needs at this time

## 2023-01-11 NOTE — Progress Notes (Signed)
Patient ID: George Owens, male   DOB: 01-29-1932, 89 y.o.   MRN: [id number]  Patient with persistent elevation of bilirubin with filling defect on the MRCP. In my opinion, needs ERCP.  As per GI, no need of ERCP at this moment. Continue management of hyperbilirubinemia as per GI recommendations. Surgery will sign off. Reconsult if necessary.   Dr. Hazle Quant.

## 2023-01-11 NOTE — Progress Notes (Signed)
GI Inpatient Follow-up Note  Subjective:  Patient seen and clinically stable. T. Bili is stable at 1.9. No abdominal pain.  Scheduled Inpatient Medications:   calcium-vitamin D  1 tablet Oral Q breakfast   cholecalciferol  1,000 Units Oral q AM   cyanocobalamin  1,000 mcg Oral QPM   enoxaparin (LOVENOX) injection  40 mg Subcutaneous Q24H   metoprolol succinate  25 mg Oral q AM   pantoprazole  40 mg Oral Daily   tamsulosin  0.4 mg Oral QPC supper    Continuous Inpatient Infusions:    sodium chloride 100 mL/hr at 01/10/23 2222   piperacillin-tazobactam (ZOSYN)  IV 3.375 g (01/11/23 0811)    PRN Inpatient Medications:  acetaminophen **OR** acetaminophen, fentaNYL (SUBLIMAZE) injection, loratadine, magnesium hydroxide, morphine injection, ondansetron **OR** ondansetron (ZOFRAN) IV, polyvinyl alcohol, traZODone  Review of Systems:  Review of Systems  Constitutional:  Negative for chills, fever and malaise/fatigue.  Respiratory:  Negative for shortness of breath.   Cardiovascular:  Negative for chest pain.  Gastrointestinal:  Negative for abdominal pain.  Genitourinary:  Negative for dysuria.  Musculoskeletal:  Negative for joint pain.  Skin:  Negative for rash.  Neurological:  Negative for focal weakness.  Psychiatric/Behavioral:  Negative for substance abuse.   All other systems reviewed and are negative.     Physical Examination: BP 121/70 (BP Location: Left Arm)   Pulse 85   Temp 98.3 F (36.8 C)   Resp 18   Ht 6\' 3"  (1.905 m)   Wt 80.3 kg   SpO2 95%   BMI 22.12 kg/m  Gen: NAD, alert and oriented x 4 HEENT: PEERLA, EOMI, Neck: supple, no JVD or thyromegaly Chest: No respiratory distress Abd: soft, non-tender, non-distended Ext: no edema, well perfused with 2+ pulses, Skin: no rash or lesions noted Lymph: no LAD  Data: Lab Results  Component Value Date   WBC 11.2 (H) 01/11/2023   HGB 12.8 (L) 01/11/2023   HCT 36.4 (L) 01/11/2023   MCV 90.8 01/11/2023    PLT 238 01/11/2023   Recent Labs  Lab 01/10/23 0844 01/11/23 0441  HGB 14.4 12.8*   Lab Results  Component Value Date   NA 136 01/11/2023   K 3.2 (L) 01/11/2023   CL 107 01/11/2023   CO2 25 01/11/2023   BUN 17 01/11/2023   CREATININE 0.77 01/11/2023   Lab Results  Component Value Date   ALT 164 (H) 01/11/2023   AST 142 (H) 01/11/2023   ALKPHOS 59 01/11/2023   BILITOT 1.9 (H) 01/11/2023   No results for input(s): "APTT", "INR", "PTT" in the last 168 hours. Assessment/Plan: George Owens is a 89 y.o. gentleman with history of pancreatic cancer s/p distal pancreatectomy and splenectomy here with chief complaint of chest pressure which he described as "an elephant sitting on my chest" with findings of biliary sludge on MRCP and slightly elevated liver enzymes. No acute indication for ERCP. Seems to be indirect predominant bilirubin. There's a chance that his liver enzymes are transitory, especially given indirect predominance. Given concern of filling defect from medical team it's not unreasonable to transfer given no proceduralist here this week that can perform an ERCP if needed. Discussed with patient that he may not need an ERCP but it could safer at facility in case he did need one. Also, no guarantee with speedy transfer given all area hospitals are at capacity.   Recommendations:  - IV fluids - diet is ok - continue IV antibiotics - daily CMP, will also  fractionate bilirubin - will check hepatitis panel as well - he does carry diagnosis of cirrhosis from outside GI attributed to MASLD but has never had biopsy and platelets are normal  Will continue to follow, please call with any questions or concerns.  Merlyn Lot MD, MPH

## 2023-01-11 NOTE — Plan of Care (Signed)
  Problem: Education: Goal: Knowledge of General Education information will improve Description: Including pain rating scale, medication(s)/side effects and non-pharmacologic comfort measures Outcome: Progressing   Problem: Clinical Measurements: Goal: Ability to maintain clinical measurements within normal limits will improve Outcome: Progressing Goal: Respiratory complications will improve Outcome: Progressing Goal: Cardiovascular complication will be avoided Outcome: Progressing   Problem: Activity: Goal: Risk for activity intolerance will decrease Outcome: Progressing   Problem: Coping: Goal: Level of anxiety will decrease Outcome: Progressing   Problem: Elimination: Goal: Will not experience complications related to bowel motility Outcome: Progressing Goal: Will not experience complications related to urinary retention Outcome: Progressing   Problem: Pain Managment: Goal: General experience of comfort will improve Outcome: Progressing

## 2023-01-12 ENCOUNTER — Telehealth: Payer: Self-pay | Admitting: Internal Medicine

## 2023-01-12 DIAGNOSIS — R1013 Epigastric pain: Secondary | ICD-10-CM | POA: Diagnosis not present

## 2023-01-12 LAB — COMPREHENSIVE METABOLIC PANEL
ALT: 101 U/L — ABNORMAL HIGH (ref 0–44)
AST: 57 U/L — ABNORMAL HIGH (ref 15–41)
Albumin: 2.9 g/dL — ABNORMAL LOW (ref 3.5–5.0)
Alkaline Phosphatase: 48 U/L (ref 38–126)
Anion gap: 7 (ref 5–15)
BUN: 13 mg/dL (ref 8–23)
CO2: 23 mmol/L (ref 22–32)
Calcium: 7.7 mg/dL — ABNORMAL LOW (ref 8.9–10.3)
Chloride: 107 mmol/L (ref 98–111)
Creatinine, Ser: 0.83 mg/dL (ref 0.61–1.24)
Glucose, Bld: 133 mg/dL — ABNORMAL HIGH (ref 70–99)
Potassium: 3.8 mmol/L (ref 3.5–5.1)
Sodium: 137 mmol/L (ref 135–145)
Total Bilirubin: 1.4 mg/dL — ABNORMAL HIGH (ref 0.3–1.2)
Total Protein: 5.6 g/dL — ABNORMAL LOW (ref 6.5–8.1)

## 2023-01-12 LAB — CBC WITH DIFFERENTIAL/PLATELET
Abs Immature Granulocytes: 0.04 10*3/uL (ref 0.00–0.07)
Basophils Absolute: 0.1 10*3/uL (ref 0.0–0.1)
Basophils Relative: 1 %
Eosinophils Absolute: 0.1 10*3/uL (ref 0.0–0.5)
Eosinophils Relative: 1 %
HCT: 34.2 % — ABNORMAL LOW (ref 39.0–52.0)
Hemoglobin: 11.9 g/dL — ABNORMAL LOW (ref 13.0–17.0)
Immature Granulocytes: 0 %
Lymphocytes Relative: 37 %
Lymphs Abs: 3.9 10*3/uL (ref 0.7–4.0)
MCH: 31.6 pg (ref 26.0–34.0)
MCHC: 34.8 g/dL (ref 30.0–36.0)
MCV: 91 fL (ref 80.0–100.0)
Monocytes Absolute: 1.4 10*3/uL — ABNORMAL HIGH (ref 0.1–1.0)
Monocytes Relative: 14 %
Neutro Abs: 5.2 10*3/uL (ref 1.7–7.7)
Neutrophils Relative %: 47 %
RBC: 3.76 MIL/uL — ABNORMAL LOW (ref 4.22–5.81)
RDW: 13.9 % (ref 11.5–15.5)
nRBC: 0 % (ref 0.0–0.2)

## 2023-01-12 LAB — MAGNESIUM: Magnesium: 1.7 mg/dL (ref 1.7–2.4)

## 2023-01-12 LAB — HEPATITIS PANEL, ACUTE
HCV Ab: NONREACTIVE
Hep A IgM: NONREACTIVE
Hep B C IgM: NONREACTIVE
Hepatitis B Surface Ag: NONREACTIVE

## 2023-01-12 LAB — PHOSPHORUS: Phosphorus: 2 mg/dL — ABNORMAL LOW (ref 2.5–4.6)

## 2023-01-12 MED ORDER — AMOXICILLIN-POT CLAVULANATE 875-125 MG PO TABS: 1.0000 | ORAL_TABLET | Freq: Two times a day (BID) | ORAL | 0 refills | Status: AC

## 2023-01-12 NOTE — Progress Notes (Signed)
Mobility Specialist - Progress Note   01/12/23 1053  Mobility  Activity Ambulated with assistance to bathroom;Ambulated with assistance in room  Level of Assistance Standby assist, set-up cues, supervision of patient - no hands on  Assistive Device None  Distance Ambulated (ft) 12 ft  Activity Response Tolerated well  $Mobility charge 1 Mobility  Mobility Specialist Start Time (ACUTE ONLY) 1050  Mobility Specialist Stop Time (ACUTE ONLY) 1053  Mobility Specialist Time Calculation (min) (ACUTE ONLY) 3 min   MS responding to call bell. Pt amb from the bathroom to bed with supervision while pushing the IV. Pt returned to bed, left seated with needs within reach.  Zetta Bills Mobility Specialist 01/12/23 10:57 AM

## 2023-01-12 NOTE — Discharge Summary (Signed)
Physician Discharge Summary  George Owens ZOX:[id number] DOB: Mar 18, 1932 DOA: 01/10/2023  PCP: [name], MD  Admit date: 01/10/2023 Discharge date: 01/12/2023  Time spent: 40 minutes  Recommendations for Outpatient Follow-up:  Follow outpatient CBC/CMP  Follow with GI outpatient Follow up with general surgery outpatient    Discharge Diagnoses:  Principal Problem:   Cholelithiasis Active Problems:   Biliary sludge   BPH (benign prostatic hyperplasia)   GERD without esophagitis   Essential hypertension  Discharge Condition: stable  Diet recommendation: heart healthy  Filed Weights   01/10/23 0841  Weight: 80.3 kg    History of present illness:   George Owens is George Owens 89 y.o. male with medical history significant for liver cirrhosis, GERD, hypertension, dyslipidemia, peripheral neuropathy, pancreatic cancer status post pancreatic tail resection with reported lung metastasis, who presented to the emergency room with acute onset of substernal chest and epigastric abdominal pain that started this morning with associated nausea and dry heaves.    LFT's were elevated.  MRCP showed cholelithiasis and filling defect within the mid common bile duct.  GI and surgery were consulted.  Considered transfer, but [facility] declined.  Given improvement in symptoms and LFT's, planning for outpatient follow up.  See below for additional details   Hospital Course:  Assessment and Plan:  Elevated Liver Enzymes  Cholelithiasis  Biliary sludge Improving LFT's today MRCP with cholelithiasis, elongated filling defect within mid common bile duct measuring 1.6 cm (not have typical appearance of gallstone, may represent sludge), cirrhotic morphology of the liver Discussed with GI, given improvement in LFT's will plan for discharge with outpatient follow up  Needs outpatient GI follow up consider outpatient ERCP or cholecystectomy with surgery   Cirrhotic Morphology of Liver Follow with GI  outpatient  Pancreatic Cancer Pulmonary Nodules Compatible with Metastatic Disease Follow with outpatient providers  Essential hypertension - Toprol-XL.   GERD without esophagitis - PPI   BPH (benign prostatic hyperplasia) - Flomax    Procedures: none   Consultations: GI surgery  Discharge Exam: Vitals:   01/12/23 0431 01/12/23 0726  BP: (!) 101/53 114/65  Pulse: 69 67  Resp: 18 18  Temp: 98.2 F (36.8 C) (!) 97.4 F (36.3 C)  SpO2: 95% 97%   No complaints Daughter and wife at bedside  General: No acute distress. Cardiovascular: RRR Lungs: unlabored Abdomen: Soft, nontender, nondistended  Neurological: Alert and oriented 3. Moves all extremities 4 with equal strength. Cranial nerves II through XII grossly intact. Extremities: No clubbing or cyanosis. No edema.   Discharge Instructions   Discharge Instructions     Call MD for:  difficulty breathing, headache or visual disturbances   Complete by: As directed    Call MD for:  extreme fatigue   Complete by: As directed    Call MD for:  hives   Complete by: As directed    Call MD for:  persistant dizziness or light-headedness   Complete by: As directed    Call MD for:  persistant nausea and vomiting   Complete by: As directed    Call MD for:  redness, tenderness, or signs of infection (pain, swelling, redness, odor or green/yellow discharge around incision site)   Complete by: As directed    Call MD for:  severe uncontrolled pain   Complete by: As directed    Call MD for:  temperature >100.4   Complete by: As directed    Diet - low sodium heart healthy   Complete by: As directed  Discharge instructions   Complete by: As directed    You were seen for abdominal pain.  You had imaging that showed gallstones and an elongated filling defect within the mid common bile duct concerning for sludge.  Your labs were improving today.  We'll plan to discharge you with another 5 days of augmentin.     You'll need outpatient follow up with general surgery to discuss cholecystectomy (gallbladder removal).  You'll need follow up with GI as well (please call your gastroenterologist to arrange follow up).  Your CT scan showed cirrhotic morphology of the liver.  You should follow up with gastroenterology as an outpatient.  You have pulmonary nodules concerning for metastatic disease.   Return immediately for fever, chills, worsening abdominal pain or other new, recurrent, or worsening symptoms.  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   Please ask your PCP to request records from this hospitalization so they know what was done and what the next steps will be.   Increase activity slowly   Complete by: As directed       Allergies as of 01/12/2023       Reactions   Kenalog [triamcinolone Acetonide]    Blindness X 3 Days injection        Medication List     STOP taking these medications    hydrocortisone 2.5 % rectal cream Commonly known as: ANUSOL-HC   LUCENTIS IO       TAKE these medications    acetaminophen 500 MG tablet Commonly known as: TYLENOL Take 1,000 mg by mouth every 6 (six) hours as needed for moderate pain.   amoxicillin-clavulanate 875-125 MG tablet Commonly known as: AUGMENTIN Take 1 tablet by mouth 2 (two) times daily for 5 days.   Blood Glucose Monitoring Suppl Devi 1 each by Does not apply route daily. May substitute to any manufacturer covered by patient's insurance.   OneTouch Verio w/Device Kit Please use to check blood sugars once daily. Dx code: E11.65   BLOOD GLUCOSE TEST STRIPS Strp May substitute to any manufacturer covered by patient's insurance.  Use to  check blood sugar once daily at variable times   OneTouch Verio test strip Generic drug: glucose blood Use to check blood sugars one time daily. Dx code: E11.65   CALCIUM 600 + D PO Take 1 tablet by mouth in the morning.   cholecalciferol 25  MCG (1000 UNIT) tablet Commonly known as: VITAMIN D3 Take 1,000 Units by mouth in the morning.   Cranberry 500 MG Caps Take 500 mg by mouth in the morning.   cyanocobalamin 1000 MCG tablet Commonly known as: VITAMIN B12 Take 1,000 mcg by mouth every evening.   JOINT HEALTH PO Take 1 tablet by mouth every evening.   loratadine 10 MG tablet Commonly known as: CLARITIN Take 10 mg by mouth daily as needed for allergies.   metoprolol succinate 25 MG 24 hr tablet Commonly known as: TOPROL-XL Take 25 mg by mouth in the morning.   naproxen sodium 220 MG tablet Commonly known as: ALEVE Take 220 mg by mouth daily as needed.   omeprazole 40 MG capsule Commonly known as: PRILOSEC TAKE 1 CAPSULE (40 MG TOTAL) BY MOUTH DAILY.   onetouch ultrasoft lancets Use to check blood sugars once daily. Dx code: E11.65   Prolia 60 MG/ML Sosy injection Generic drug: denosumab Inject 60 mg into the skin every 6 (six) months.   SYSTANE OP Place 1 drop into both eyes 2 (  two) times daily as needed (dry/irritated eyes.).   tamsulosin 0.4 MG Caps capsule Commonly known as: FLOMAX 1 capsule daily       Allergies  Allergen Reactions   Kenalog [Triamcinolone Acetonide]     Blindness X 3 Days injection      The results of significant diagnostics from this hospitalization (including imaging, microbiology, ancillary and laboratory) are listed below for reference.    Significant Diagnostic Studies: MR 3D Recon At Scanner  Result Date: 01/11/2023 : CLINICAL DATA: Evaluate gallbladder nodularity. EXAM: MRI ABDOMEN WITHOUT AND WITH CONTRAST (INCLUDING MRCP) TECHNIQUE: Multiplanar multisequence MR imaging of the abdomen was performed both before and after the administration of intravenous contrast. Heavily T2-weighted images of the biliary and pancreatic ducts were obtained, and three-dimensional MRCP images were rendered by post processing. CONTRAST: 8mL GADAVIST GADOBUTROL 1 MMOL/ML IV SOLN  COMPARISON: CT from earlier today FINDINGS: Lower chest: Pulmonary nodules are again seen compatible with metastatic disease. The largest is in the posterior right base measuring 1.3 cm, image 8/4. Hepatobiliary: Mild diffuse hepatic steatosis. No focal liver abnormality identified. Cirrhotic morphology of the liver is noted with the hypertrophy of the lateral segment of left hepatic lobe and caudate lobe. Macro lobular contour the liver noted. Stone within the dependent portion of the gallbladder measures 7 mm scratch stones are identified within the gallbladder neck measuring up to 7 mm. No suspicious enhancing gallbladder lesion identified. Common bile duct measures 8 mm in diameter. Elongated filling, nonenhancing defect within the mid common bile duct is identified measuring 1.6 cm, image 50/20. This does not have the typical appearance of Graci Hulce gallstone in may represent sludge. Pancreas: Status post distal pancreatectomy. No main duct dilatation, inflammation or suspicious mass. Spleen: Status post splenectomy. Adrenals/Urinary Tract: Normal adrenal glands. No nephrolithiasis, hydronephrosis or suspicious mass. Tiny cyst within upper pole of right kidney measures 7 mm. No follow-up imaging recommended. Similarly there is Brandy Kabat simple cyst off the inferior pole of left kidney measuring 1 cm, image 1228/4. No follow-up imaging recommended. Stomach/Bowel: Visualized portions within the abdomen are unremarkable. Vascular/Lymphatic: Normal caliber of the abdominal aorta. Aortic atherosclerotic calcifications. 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 No abdominal adenopathy. Other: No ascites or focal fluid collections. Musculoskeletal: Moderate compression deformity is again seen involving the T9 and L2 vertebra. No enhancing bone lesions identified. IMPRESSION: 1. Cholelithiasis. No suspicious enhancing gallbladder lesion identified. 2. Elongated filling defect within the mid common bile  duct measuring 1.6 cm. This does not have the typical appearance of Zalma Channing gallstone and may represent sludge. 3. Cirrhotic morphology of the liver. No focal liver abnormality identified. 4. Pulmonary nodules compatible with metastatic disease. 5. 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 7. Aortic Atherosclerosis (ICD10-I70.0). Electronically Signed   By: Signa Kell M.D.   On: 01/11/2023 15:28   MR ABDOMEN MRCP W WO CONTAST  Result Date: 01/10/2023 CLINICAL DATA:  Evaluate gallbladder nodularity. EXAM: MRI ABDOMEN WITHOUT AND WITH CONTRAST (INCLUDING MRCP) TECHNIQUE: Multiplanar multisequence MR imaging of the abdomen was performed both before and after the administration of intravenous contrast. Heavily T2-weighted images of the biliary and pancreatic ducts were obtained, and three-dimensional MRCP images were rendered by post processing. CONTRAST:  8mL GADAVIST GADOBUTROL 1 MMOL/ML IV SOLN COMPARISON:  CT from earlier today FINDINGS: Lower chest: Pulmonary nodules are again seen compatible with metastatic disease. The largest is in the posterior right base measuring 1.3 cm, image 8/4.  Hepatobiliary: Mild diffuse hepatic steatosis. No focal liver abnormality identified. Cirrhotic morphology of the liver is noted with the hypertrophy of the lateral segment of left hepatic lobe and caudate lobe. Macro lobular contour the liver noted. Stone within the dependent portion of the gallbladder measures 7 mm scratch stones are identified within the gallbladder neck measuring up to 7 mm. No suspicious enhancing gallbladder lesion identified. Common bile duct measures 8 mm in diameter. Elongated filling, nonenhancing defect within the mid common bile duct is identified measuring 1.6 cm, image 50/20. This does not have the typical appearance of Clova Morlock gallstone in may represent sludge. Pancreas: Status post distal pancreatectomy. No main duct dilatation, inflammation or  suspicious mass. Spleen:  Status post splenectomy. Adrenals/Urinary Tract: Normal adrenal glands. No nephrolithiasis, hydronephrosis or suspicious mass. Tiny cyst within upper pole of right kidney measures 7 mm. No follow-up imaging recommended. Similarly there is Rewa Weissberg simple cyst off the inferior pole of left kidney measuring 1 cm, image 1228/4. No follow-up imaging recommended. Stomach/Bowel: Visualized portions within the abdomen are unremarkable. Vascular/Lymphatic: Normal caliber of the abdominal aorta. Aortic atherosclerotic calcifications. 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 No abdominal adenopathy. Other:  No ascites or focal fluid collections. Musculoskeletal: Moderate compression deformity is again seen involving the T9 and L2 vertebra. No enhancing bone lesions identified. IMPRESSION: 1. Cholelithiasis. No suspicious enhancing gallbladder lesion identified. 2. Elongated filling defect within the mid common bile duct measuring 1.6 cm. This does not have the typical appearance of Zakariye Nee gallstone and may represent sludge. 3. Cirrhotic morphology of the liver. No focal liver abnormality identified. 4. Pulmonary nodules compatible with metastatic disease. 5. 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 7.  Aortic Atherosclerosis (ICD10-I70.0). Electronically Signed   By: Signa Kell M.D.   On: 01/10/2023 15:54   US ABDOMEN LIMITED RUQ (LIVER/GB)  Result Date: 01/10/2023 CLINICAL DATA:  Transaminitis. Previous partial pancreatic resection for neoplasm EXAM: ULTRASOUND ABDOMEN LIMITED RIGHT UPPER QUADRANT COMPARISON:  None Available. FINDINGS: Gallbladder: Distended gallbladder with some nodularity posteriorly without clear shadowing. These could be nonshadowing stones versus other nodules are polyps. Common bile duct: Diameter: 3 mm Liver: Diffusely echogenic hepatic parenchyma consistent with some fatty liver  infiltration. Please correlate with the prior contrast CT scan. Portal vein is patent on color Doppler imaging with normal direction of blood flow towards the liver. Other: None. IMPRESSION: Distended gallbladder with some nodularity posteriorly along the wall. Not clearly stones. Recommend further workup when appropriate such as with and without contrast MRI. No biliary ductal dilatation. Electronically Signed   By: Karen Kays M.D.   On: 01/10/2023 13:14   CT ABDOMEN PELVIS W CONTRAST  Result Date: 01/10/2023 CLINICAL DATA:  Abdominal pain. Transaminitis. History of pancreatic cancer. Pathologic spinal fracture. EXAM: CT ABDOMEN AND PELVIS WITH CONTRAST TECHNIQUE: Multidetector CT imaging of the abdomen and pelvis was performed using the standard protocol following bolus administration of intravenous contrast. RADIATION DOSE REDUCTION: This exam was performed according to the departmental dose-optimization program which includes automated exposure control, adjustment of the mA and/or kV according to patient size and/or use of iterative reconstruction technique. CONTRAST:  OMNIPAQUE IOHEXOL 300 MG/ML  SOLN COMPARISON:  CT 10/07/2022 abdomen pelvis.  Chest CT 10/27/2022. FINDINGS: Lower chest: Multiple pulmonary nodules again identified consistent with known metastatic disease. Example nodule right lung base posteromedial with the transverse dimension today of 12 mm. On the prior abdomen pelvis CT 7  mm in the more recent chest CT 11 mm. Hepatobiliary: Stable punctate calcification in the inferior aspect of the right hepatic lobe. No other space-occupying liver lesion at this time. Patent portal vein. Gallbladder distended with some gallbladder wall thickening and some dependent nodularity. Please correlate for any known history. Pancreas: Surgical changes of distal pancreatectomy. Residual pancreatic head and neck with some atrophy. No obvious mass in this location. Spleen: Spleen is surgically absent.  Area of fat necrosis in the surgical bed. Adrenals/Urinary Tract: Adrenal glands are preserved. Stable Bosniak 1 exophytic lower pole lateral left-sided renal cyst. No enhancing renal mass or collecting system dilatation. No specific imaging follow-up of the benign cysts. Similar upper pole focus on the right. The ureters have normal course and caliber down to the bladder. Preserved contours of the urinary bladder. Stomach/Bowel: No oral contrast. Large bowel is of normal course and caliber with scattered stool. Normal appendix. Stomach and small bowel are nondilated. Vascular/Lymphatic: Normal caliber IVC. Diffuse vascular calcifications along the aorta as some noncalcified plaque. Once again there is Rima Blizzard right-sided renal artery aneurysm with some marginal calcification measuring 15 mm. Similar to previous when adjusting for technique. No developing new lymph node enlargement identified in the abdomen and pelvis. Reproductive: Radiation seeds along the prostate. Other: No free air or free fluid. Musculoskeletal: Curvature of the spine with degenerative changes. Degenerative changes of the pelvis. Moderate compression of the superior endplate of L2 is stable. More significant compression is seen at the edge of the imaging field, incompletely, at T9. Stable sclerotic lesion along the right femur. 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 Distended gallbladder with stones and possible wall thickening. If there is further concern of acute gallbladder pathology, an ultrasound may be useful as the next step in the workup. 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 Electronically Signed   By: Karen Kays M.D.   On: 01/10/2023 11:05   DG Chest Port 1 View  Result Date: 01/10/2023 CLINICAL DATA:  chest pain EXAM: PORTABLE CHEST 1 VIEW COMPARISON:  CXR 11/01/22 FINDINGS: Right-sided chest port in place with tip at cavoatrial junction. No pleural effusion. No  pneumothorax. Unchanged cardiac and mediastinal contours. No focal airspace opacity. No radiographically apparent displaced rib fractures. Visualized upper abdomen is unremarkable. IMPRESSION: No focal airspace opacity Electronically Signed   By: Lorenza Cambridge M.D.   On: 01/10/2023 09:04    Microbiology: No results found for this or any previous visit (from the past 240 hour(s)).   Labs: Basic Metabolic Panel: Recent Labs  Lab 01/10/23 0844 01/11/23 0441 01/12/23 0452  NA 135 136 137  K 3.9 3.2* 3.8  CL 98 107 107  CO2 27 25 23   GLUCOSE 185* 151* 133*  BUN 22 17 13   CREATININE 0.78 0.77 0.83  CALCIUM 9.2 7.9* 7.7*  MG  --   --  1.7  PHOS  --   --  2.0*   Liver Function Tests: Recent Labs  Lab 01/10/23 0844 01/11/23 0441 01/12/23 0452  AST 239* 142* 57*  ALT 135* 164* 101*  ALKPHOS 68 59 48  BILITOT 1.8* 1.9* 1.4*  PROT 7.1 5.9* 5.6*  ALBUMIN 3.9 3.2* 2.9*   No results for input(s): "LIPASE", "AMYLASE" in the last 168 hours. No results for input(s): "AMMONIA" in the last 168 hours. CBC: Recent Labs  Lab 01/10/23 0844 01/11/23 0441 01/12/23 0452  WBC 12.8* 11.2* 10.7*  NEUTROABS 10.2*  --  5.2  HGB 14.4 12.8* 11.9*  HCT 42.4 36.4* 34.2*  MCV 91.0 90.8 91.0  PLT 279 238 199   Cardiac Enzymes: No results for input(s): "CKTOTAL", "CKMB", "CKMBINDEX", "TROPONINI" in the last 168 hours. BNP: BNP (last 3 results) Recent Labs    01/10/23 0844  BNP 137.6*    ProBNP (last 3 results) No results for input(s): "PROBNP" in the last 8760 hours.  CBG: No results for input(s): "GLUCAP" in the last 168 hours.     Signed:  Lacretia Nicks MD.  [facility] 01/12/2023, 4:03 PM

## 2023-01-12 NOTE — Telephone Encounter (Signed)
A nurse from [facility] called in stating that pt was admitted and will be going home today, so she called in looking for a hospital f/u for pt, however, the next available opening its 02/08/23. She stated if possible he can see provider sooner?

## 2023-01-12 NOTE — Progress Notes (Signed)
PROGRESS NOTE    George Owens  OZH:[id number] DOB: [birth year] DOA: 01/10/2023 PCP: [name], MD  Chief Complaint  Patient presents with   Chest Pain    Brief Narrative:   George Owens is George Owens 89 y.o. male with medical history significant for liver cirrhosis, GERD, hypertension, dyslipidemia, peripheral neuropathy, pancreatic cancer status post pancreatic tail resection with reported lung metastasis, who presented to the emergency room with acute onset of substernal chest and epigastric abdominal pain that started this morning with associated nausea and dry heaves.     LFT's were elevated.  MRCP showed cholelithiasis and filling defect within the mid common bile duct.  GI and surgery were consulted.    Assessment & Plan:   Principal Problem:   Cholelithiasis Active Problems:   Biliary sludge   BPH (benign prostatic hyperplasia)   GERD without esophagitis   Essential hypertension  Elevated Liver Enzymes  Cholelithiasis  Biliary sludge Improving LFT's today MRCP with cholelithiasis, elongated filling defect within mid common bile duct measuring 1.6 cm (not have typical appearance of gallstone, may represent sludge), cirrhotic morphology of the liver Discussed with [facility] at [facility] and they deferred transfer 7/24 Appreciate GI, they were looking into possible transfer to [facility] signed off - needs ERCP in their opinion    Cirrhotic Morphology of Liver Follow with GI outpatient   Pancreatic Cancer Pulmonary Nodules Compatible with Metastatic Disease Follow with outpatient providers   Essential hypertension - Toprol-XL.   GERD without esophagitis - PPI   BPH (benign prostatic hyperplasia) - Flomax     DVT prophylaxis: lovenox Code Status: full Family Communication: daughter, wife Disposition:   Status is: Inpatient Remains inpatient appropriate because: continued need for inpatient care   Consultants:  GI surgery  Procedures:   none  Antimicrobials:  Anti-infectives (From admission, onward)    Start     Dose/Rate Route Frequency Ordered Stop   01/12/23 0000  amoxicillin-clavulanate (AUGMENTIN) 875-125 MG tablet        1 tablet Oral 2 times daily 01/12/23 1602 01/17/23 2359   01/11/23 1000  piperacillin-tazobactam (ZOSYN) IVPB 3.375 g       Placed in "Followed by" Linked Group   3.375 g 12.5 mL/hr over 240 Minutes Intravenous Every 8 hours 01/10/23 2307     01/11/23 0000  piperacillin-tazobactam (ZOSYN) IVPB 3.375 g  Status:  Discontinued        3.375 g 100 mL/hr over 30 Minutes Intravenous Every 8 hours 01/10/23 2300 01/10/23 2303   01/11/23 0000  piperacillin-tazobactam (ZOSYN) IVPB 3.375 g  Status:  Discontinued        3.375 g 12.5 mL/hr over 240 Minutes Intravenous Every 8 hours 01/10/23 2303 01/10/23 2306   01/11/23 0000  piperacillin-tazobactam (ZOSYN) IVPB 3.375 g       Placed in "Followed by" Linked Group   3.375 g 12.5 mL/hr over 240 Minutes Intravenous Every 8 hours 01/10/23 2307 01/11/23 0500   01/10/23 1500  piperacillin-tazobactam (ZOSYN) IVPB 3.375 g        3.375 g 100 mL/hr over 30 Minutes Intravenous  Once 01/10/23 1448 01/10/23 1642       Subjective: No complaints Daughter and wife at bedside  Objective: Vitals:   01/11/23 1626 01/11/23 2025 01/12/23 0431 01/12/23 0726  BP: (!) 115/57 (!) 120/57 (!) 101/53 114/65  Pulse: 78 83 69 67  Resp: 18 18 18 18   Temp: 98.8 F (37.1 C) 98.7 F (37.1 C) 98.2 F (36.8 C) (!)  97.4 F (36.3 C)  TempSrc:  Oral Oral Oral  SpO2: 96% 98% 95% 97%  Weight:      Height:        Intake/Output Summary (Last 24 hours) at 01/12/2023 1632 Last data filed at 01/12/2023 0500 Gross per 24 hour  Intake --  Output 350 ml  Net -350 ml   Filed Weights   01/10/23 0841  Weight: 80.3 kg    Examination:  General exam: Appears calm and comfortable  Respiratory system: unlabored Cardiovascular system: RRR Gastrointestinal system: Abdomen is  nondistended, soft and nontender Central nervous system: Alert and oriented. No focal neurological deficits. Extremities: no LEE   Data Reviewed: I have personally reviewed following labs and imaging studies  CBC: Recent Labs  Lab 01/10/23 0844 01/11/23 0441 01/12/23 0452  WBC 12.8* 11.2* 10.7*  NEUTROABS 10.2*  --  5.2  HGB 14.4 12.8* 11.9*  HCT 42.4 36.4* 34.2*  MCV 91.0 90.8 91.0  PLT 279 238 199    Basic Metabolic Panel: Recent Labs  Lab 01/10/23 0844 01/11/23 0441 01/12/23 0452  NA 135 136 137  K 3.9 3.2* 3.8  CL 98 107 107  CO2 27 25 23   GLUCOSE 185* 151* 133*  BUN 22 17 13   CREATININE 0.78 0.77 0.83  CALCIUM 9.2 7.9* 7.7*  MG  --   --  1.7  PHOS  --   --  2.0*    GFR: Estimated Creatinine Clearance: 69.9 mL/min (by C-G formula based on SCr of 0.83 mg/dL).  Liver Function Tests: Recent Labs  Lab 01/10/23 0844 01/11/23 0441 01/12/23 0452  AST 239* 142* 57*  ALT 135* 164* 101*  ALKPHOS 68 59 48  BILITOT 1.8* 1.9* 1.4*  PROT 7.1 5.9* 5.6*  ALBUMIN 3.9 3.2* 2.9*    CBG: No results for input(s): "GLUCAP" in the last 168 hours.   No results found for this or any previous visit (from the past 240 hour(s)).       Radiology Studies: No results found.      Scheduled Meds:  calcium-vitamin D  1 tablet Oral Q breakfast   cholecalciferol  1,000 Units Oral q AM   cyanocobalamin  1,000 mcg Oral QPM   enoxaparin (LOVENOX) injection  40 mg Subcutaneous Q24H   metoprolol succinate  25 mg Oral q AM   pantoprazole  40 mg Oral Daily   tamsulosin  0.4 mg Oral QPC supper   Continuous Infusions:  sodium chloride 100 mL/hr at 01/12/23 1527   piperacillin-tazobactam (ZOSYN)  IV 3.375 g (01/12/23 1238)     LOS: 2 days    Time spent: over 30 min     Lacretia Nicks, MD [facility]   To contact the attending provider between 7A-7P or the covering provider during after hours 7P-7A, please log into the web site [email or web address] and access  using universal [place] password for that web site. If you do not have the password, please call the hospital operator.  01/12/2023, 4:32 PM

## 2023-01-12 NOTE — Care Management Important Message (Signed)
Important Message  Patient Details  Name: [name] MRN: [id number] Date of Birth: April 07, 1932   Medicare Important Message Given:  N/A - LOS <3 / Initial given by admissions     Johnell Comings 01/12/2023, 2:39 PM

## 2023-01-13 ENCOUNTER — Telehealth: Payer: Self-pay | Admitting: *Deleted

## 2023-01-13 NOTE — Telephone Encounter (Signed)
Pt would like to know if he need a hospital follow-up and would like to be called

## 2023-01-13 NOTE — Transitions of Care (Post Inpatient/ED Visit) (Signed)
01/13/2023  Name: George Owens MRN: [id number] DOB: [birth year]  Today's TOC FU Call Status: Today's TOC FU Call Status:: Successful TOC FU Call Competed  Transition Care Management Follow-up Telephone Call Date of Discharge: 01/12/23 Discharge Facility: [facility]) Type of Discharge: Inpatient Admission Primary Inpatient Discharge Diagnosis:: cholelithiasis How have you been since you were released from the hospital?: Better Any questions or concerns?: Yes Patient Questions/Concerns:: Patient whated to know how [facility] would help them to see GB. RN explained what the ERCP and how the the procedure is done. RN explained what sludge inthe GB is. Patient Questions/Concerns Addressed: Other:  Items Reviewed: Did you receive and understand the discharge instructions provided?: Yes Medications obtained,verified, and reconciled?: Yes (Medications Reviewed) Any new allergies since your discharge?: No Dietary orders reviewed?: No Do you have support at home?: Yes People in Home: spouse Name of Support/Comfort Primary Source: George Owens  Medications Reviewed Today: Medications Reviewed Today     Reviewed by Luella Cook, RN (Case Manager) on 01/13/23 at 1153  Med List Status: <None>   Medication Order Taking? Sig Documenting Provider Last Dose Status Informant  acetaminophen (TYLENOL) 500 MG tablet [id number] Yes Take 1,000 mg by mouth every 6 (six) hours as needed for moderate pain. [provider] Taking Active Self  amoxicillin-clavulanate (AUGMENTIN) 875-125 MG tablet [id number] Yes Take 1 tablet by mouth 2 (two) times daily for 5 days. Zigmund Daniel., MD Taking Active   Blood Glucose Monitoring Suppl [facility] VERIO) w/Device Andria Rhein [id number]  Please use to check blood sugars once daily. Dx code: E11.65 [name], MD  Active Self  Blood Glucose Monitoring Suppl DEVI [id number]  1 each by Does not apply route daily. May substitute to  any manufacturer covered by patient's insurance. [name], MD  Active Self  Calcium Carb-Cholecalciferol (CALCIUM 600 + D PO) [id number] Yes Take 1 tablet by mouth in the morning. [provider] Taking Active Self  cholecalciferol (VITAMIN D3) 25 MCG (1000 UNIT) tablet [id number] Yes Take 1,000 Units by mouth in the morning. [provider] Taking Active Self  Cranberry 500 MG CAPS [id number] Yes Take 500 mg by mouth in the morning. [provider] Taking Active Self  denosumab (PROLIA) 60 MG/ML SOSY injection [id number] Yes Inject 60 mg into the skin every 6 (six) months. [name], MD Taking Active Self           Med Note Harlow Asa Jan 10, 2023  6:44 PM)    Glucose Blood (BLOOD GLUCOSE TEST STRIPS) STRP [id number]  May substitute to any manufacturer covered by patient's insurance.  Use to  check blood sugar once daily at variable times [name], MD  Active Self  glucose blood (ONETOUCH VERIO) test strip [id number]  Use to check blood sugars one time daily. Dx code: E11.65 [name], MD  Active Self  Lancets [facility] ULTRASOFT) lancets [id number]  Use to check blood sugars once daily. Dx code: E11.65 [name], MD  Active Self  loratadine (CLARITIN) 10 MG tablet [id number] Yes Take 10 mg by mouth daily as needed for allergies. [provider] Taking Active Self  metoprolol succinate (TOPROL-XL) 25 MG 24 hr tablet [id number] Yes Take 25 mg by mouth in the morning. [provider] Taking Active Self  Misc Natural Products (JOINT HEALTH PO) [id number] Yes Take 1 tablet by mouth every evening. [provider] Taking Active Self  naproxen  sodium (ALEVE) 220 MG tablet [id number]  Take 220 mg by mouth daily as needed. [provider]  Active Self  omeprazole (PRILOSEC) 40 MG capsule [id number] Yes TAKE 1 CAPSULE (40 MG TOTAL) BY MOUTH DAILY. Toney Reil, MD Taking Active Self  Polyethyl Glycol-Propyl  Glycol [facility] OP) [id number] Yes Place 1 drop into both eyes 2 (two) times daily as needed (dry/irritated eyes.). [provider] Taking Active Self  tamsulosin (FLOMAX) 0.4 MG CAPS capsule [id number] Yes 1 capsule daily [name], MD Taking Active Self  vitamin B-12 (CYANOCOBALAMIN) 1000 MCG tablet [id number] Yes Take 1,000 mcg by mouth every evening. [provider] Taking Active Self            Home Care and Equipment/Supplies: Were Home Health Services Ordered?: NA Any new equipment or medical supplies ordered?: NA  Functional Questionnaire: Do you need assistance with bathing/showering or dressing?: No Do you need assistance with meal preparation?: No Do you have difficulty maintaining continence: No Do you need assistance with getting out of bed/getting out of a chair/moving?: No  Follow up appointments reviewed: PCP Follow-up appointment confirmed?: No MD Provider Line Number:[phone] Given: Yes Specialist Hospital Follow-up appointment confirmed?: Yes Date of Specialist follow-up appointment?: 02/02/23 Follow-Up Specialty Provider:: Dr Hazle Quant Do you need transportation to your follow-up appointment?: No Do you understand care options if your condition(s) worsen?: Yes-patient verbalized understanding  SDOH Interventions Today    Flowsheet Row Most Recent Value  SDOH Interventions   Food Insecurity Interventions Intervention Not Indicated  Housing Interventions Intervention Not Indicated  Transportation Interventions Intervention Not Indicated, Patient Resources (Friends/Family)      Interventions Today    Flowsheet Row Most Recent Value  General Interventions   General Interventions Discussed/Reviewed General Interventions Discussed, General Interventions Reviewed, Doctor Visits  Doctor Visits Discussed/Reviewed Doctor Visits Discussed  [Patient had called Dr Darrick Huntsman office for an earlier appt.]  Pharmacy Interventions   Pharmacy  Dicussed/Reviewed Pharmacy Topics Discussed, Pharmacy Topics Reviewed      TOC Interventions Today    Flowsheet Row Most Recent Value  TOC Interventions   TOC Interventions Discussed/Reviewed TOC Interventions Discussed, TOC Interventions Reviewed  [RN discussed ERCP]        Gean Maidens BSN RN [facility] [phone]

## 2023-01-13 NOTE — Telephone Encounter (Signed)
Spoke with pt to let him know that he does need a hospital follow up but there is nothing available until 02/08/2023. Pt would like to have his bilirubin levels rechecked before then to make sure they are staying down. He does have a follow up with [facility] on 02/02/2023.

## 2023-01-14 ENCOUNTER — Other Ambulatory Visit: Payer: Self-pay | Admitting: Internal Medicine

## 2023-01-14 DIAGNOSIS — K838 Other specified diseases of biliary tract: Secondary | ICD-10-CM

## 2023-01-14 DIAGNOSIS — K8042 Calculus of bile duct with acute cholecystitis without obstruction: Secondary | ICD-10-CM

## 2023-01-16 ENCOUNTER — Encounter: Payer: Self-pay | Admitting: Internal Medicine

## 2023-01-16 ENCOUNTER — Telehealth (INDEPENDENT_AMBULATORY_CARE_PROVIDER_SITE_OTHER): Payer: PPO | Admitting: Internal Medicine

## 2023-01-16 DIAGNOSIS — R911 Solitary pulmonary nodule: Secondary | ICD-10-CM

## 2023-01-16 DIAGNOSIS — K8042 Calculus of bile duct with acute cholecystitis without obstruction: Secondary | ICD-10-CM | POA: Diagnosis not present

## 2023-01-16 DIAGNOSIS — Z09 Encounter for follow-up examination after completed treatment for conditions other than malignant neoplasm: Secondary | ICD-10-CM | POA: Insufficient documentation

## 2023-01-16 NOTE — Assessment & Plan Note (Signed)
Patient is stable post discharge and has no new issues or questions about discharge plans at the visit today for hospital follow up. All labs , imaging studies and progress notes from admission were reviewed with patient today   

## 2023-01-16 NOTE — Assessment & Plan Note (Addendum)
With biliary sludge suggested by the appearance of bile duct on MRCP.  He has improved clinically .  I have spoken with Dr Allegra Lai, his gastroenterologist .  Depending tommorow's labs , the decision to undergo ERCP vs surgery will be made .  Patient advised to follow  low fat diet  until definitive therapy has bee made

## 2023-01-16 NOTE — Telephone Encounter (Signed)
Pt is scheduled for labs. Pt avoided the question about being scheduled with the GI doctor. Pt scheduled virtual visit for today at 4 pm

## 2023-01-16 NOTE — Progress Notes (Signed)
Virtual Visit via Caregility   Note   This format is felt to be most appropriate for this patient at this time.  All issues noted in this document were discussed and addressed.  No physical exam was performed (except for noted visual exam findings with Video Visits).   I connected with George Owens on 01/16/23 at  4:00 PM EDT by a video enabled telemedicine application or telephone and verified that I am speaking with the correct person using two identifiers. Location patient: home Location provider: work or home office Persons participating in the virtual visit: patient, provider  I discussed the limitations, risks, security and privacy concerns of performing an evaluation and management service by telephone and the availability of in person appointments. I also discussed with the patient that there may be a patient responsible charge related to this service. The patient expressed understanding and agreed to proceed.  Reason for visit:  hospital follow up  HPI: 89 yr old male with history of pancreatic CA s/p distal pancreatectomy, NASH/cirrhosis of liver admitted to [facility]  in  July 25 with cholecystitis  Ielevated ALT/AST and indirect bilirubinemia, mild.  ) MRCP done , sludge vs stone suspected. No specialist available at [facility] for ERCP; Transfer to outside facility for ERCP  was recommended by surgery  but not done due to  lack of bed availability and rapid improvement in symptoms and transaminitis. He was discharged home in improved condition on July 25. He has not had any repeat episodes and Is  schedule for repeat CBC/CMT tomorrow   ROS: See pertinent positives and negatives per HPI.  Past Medical History:  Diagnosis Date   Allergy    Aneurysm of abdominal aorta (HCC)    Basal cell carcinoma    right temple 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    Past Surgical History:  Procedure Laterality Date   BRONCHIAL BRUSHINGS  11/01/2022   Procedure: BRONCHIAL BRUSHINGS;  Surgeon: Raechel Chute, MD;  Location: [facility] ENDOSCOPY;  Service: Pulmonary;;   BRONCHIAL NEEDLE ASPIRATION BIOPSY  11/01/2022   Procedure: BRONCHIAL NEEDLE ASPIRATION BIOPSIES;  Surgeon: Raechel Chute, MD;  Location: [facility] ENDOSCOPY;  Service: Pulmonary;;   BRONCHIAL WASHINGS  11/01/2022   Procedure: BRONCHIAL WASHINGS;  Surgeon: Raechel Chute, MD;  Location: [facility] ENDOSCOPY;  Service: Pulmonary;;   CATARACT EXTRACTION Right    CATARACT EXTRACTION W/PHACO Left 12/26/2017   Procedure: CATARACT EXTRACTION PHACO AND INTRAOCULAR LENS PLACEMENT (IOC)  COMPLICATED LEFT;  Surgeon: Lockie Mola, MD;  Location: [facility];  Service: Ophthalmology;  Laterality: Left;  MALYUGIN VISION BLUE   colonoscopy     ESOPHAGOGASTRODUODENOSCOPY (EGD) WITH PROPOFOL     EYE MUSCLE SURGERY Left    IR KYPHO THORACIC WITH BONE BIOPSY  11/11/2021   PORTA CATH INSERTION N/A 10/19/2020   Procedure: PORTA CATH INSERTION;  Surgeon: Annice Needy, MD;  Location: [facility] INVASIVE CV LAB;  Service: Cardiovascular;  Laterality: N/A;   TONSILLECTOMY AND ADENOIDECTOMY      Family History  Problem Relation Age of Onset   Stroke Mother    Arthritis Father    Heart disease Father  Brain cancer Brother        1/2 brother (possibly related to mother NOT related to patient)   Cancer Cousin        unk type   Lymphoma Cousin        vs leukemia   Colon cancer Neg Hx    Esophageal cancer Neg Hx    Pancreatic cancer Neg Hx    Stomach cancer Neg Hx    Liver disease Neg Hx     SOCIAL HX:  reports that he has never smoked. He has never used smokeless tobacco. He reports that he does not drink alcohol and does not use drugs.    Current  Outpatient Medications:    acetaminophen (TYLENOL) 500 MG tablet, Take 1,000 mg by mouth every 6 (six) hours as needed for moderate pain., Disp: , Rfl:    amoxicillin-clavulanate (AUGMENTIN) 875-125 MG tablet, Take 1 tablet by mouth 2 (two) times daily for 5 days., Disp: 10 tablet, Rfl: 0   Blood Glucose Monitoring Suppl (ONETOUCH VERIO) w/Device KIT, Please use to check blood sugars once daily. Dx code: E11.65, Disp: 1 kit, Rfl: 0   Blood Glucose Monitoring Suppl DEVI, 1 each by Does not apply route daily. May substitute to any manufacturer covered by patient's insurance., Disp: 1 each, Rfl: 0   Calcium Carb-Cholecalciferol (CALCIUM 600 + D PO), Take 1 tablet by mouth in the morning., Disp: , Rfl:    cholecalciferol (VITAMIN D3) 25 MCG (1000 UNIT) tablet, Take 1,000 Units by mouth in the morning., Disp: , Rfl:    Cranberry 500 MG CAPS, Take 500 mg by mouth in the morning., Disp: , Rfl:    denosumab (PROLIA) 60 MG/ML SOSY injection, Inject 60 mg into the skin every 6 (six) months., Disp: 1 mL, Rfl: 1   Glucose Blood (BLOOD GLUCOSE TEST STRIPS) STRP, May substitute to any manufacturer covered by patient's insurance.  Use to  check blood sugar once daily at variable times, Disp: 100 strip, Rfl: 0   glucose blood (ONETOUCH VERIO) test strip, Use to check blood sugars one time daily. Dx code: E11.65, Disp: 100 each, Rfl: 2   Lancets (ONETOUCH ULTRASOFT) lancets, Use to check blood sugars once daily. Dx code: E11.65, Disp: 100 each, Rfl: 2   loratadine (CLARITIN) 10 MG tablet, Take 10 mg by mouth daily as needed for allergies., Disp: , Rfl:    metoprolol succinate (TOPROL-XL) 25 MG 24 hr tablet, Take 25 mg by mouth in the morning., Disp: , Rfl:    Misc Natural Products (JOINT HEALTH PO), Take 1 tablet by mouth every evening., Disp: , Rfl:    naproxen sodium (ALEVE) 220 MG tablet, Take 220 mg by mouth daily as needed., Disp: , Rfl:    omeprazole (PRILOSEC) 40 MG capsule, TAKE 1 CAPSULE (40 MG TOTAL) BY  MOUTH DAILY., Disp: 90 capsule, Rfl: 3   Polyethyl Glycol-Propyl Glycol (SYSTANE OP), Place 1 drop into both eyes 2 (two) times daily as needed (dry/irritated eyes.)., Disp: , Rfl:    tamsulosin (FLOMAX) 0.4 MG CAPS capsule, 1 capsule daily, Disp: 90 capsule, Rfl: 1   vitamin B-12 (CYANOCOBALAMIN) 1000 MCG tablet, Take 1,000 mcg by mouth every evening., Disp: , Rfl:  No current facility-administered medications for this visit.  Facility-Administered Medications Ordered in Other Visits:    heparin lock flush 100 UNIT/ML injection, , , ,    heparin lock flush 100 unit/mL, 500 Units, Intravenous, Once, [name], MD  EXAMTheodoro Kos per patient  if applicable:  GENERAL: alert, oriented, appears well and in no acute distress  HEENT: atraumatic, conjunttiva clear, no obvious abnormalities on inspection of external nose and ears  NECK: normal movements of the head and neck  LUNGS: on inspection no signs of respiratory distress, breathing rate appears normal, no obvious gross SOB, gasping or wheezing  CV: no obvious cyanosis  MS: moves all visible extremities without noticeable abnormality  PSYCH/NEURO: pleasant and cooperative, no obvious depression or anxiety, speech and thought processing grossly intact  ASSESSMENT AND PLAN: Calculus of bile duct with acute cholecystitis without obstruction Assessment & Plan: With biliary sludge suggested by the appearance of bile duct on MRCP.  He has improved clinically .  I have spoken with Dr Allegra Lai, his gastroenterologist .  Depending tommorow's labs , the decision to undergo ERCP vs surgery will be made .  Patient advised to follow  low fat diet  until definitive therapy has bee made    Hospital discharge follow-up Assessment & Plan: Patient is stable post discharge and has no new issues or questions about discharge plans at the visit today for hospital follow up. All labs , imaging studies and progress notes from admission were reviewed with  patient today      Pulmonary nodule 1 cm or greater in diameter Assessment & Plan: Growing,  by serial imaging ,  with negative lung biopsy .  Oncology aware.         I discussed the assessment and treatment plan with the patient. The patient was provided an opportunity to ask questions and all were answered. The patient agreed with the plan and demonstrated an understanding of the instructions.   The patient was advised to call back or seek an in-person evaluation if the symptoms worsen or if the condition fails to improve as anticipated.   I spent 30 minutes dedicated to the care of this patient on the date of this encounter to include pre-visit review of his medical history,  Face-to-face time with the patient , and post visit ordering of testing and therapeutics.    [name], MD

## 2023-01-16 NOTE — Assessment & Plan Note (Addendum)
Growing,  by serial imaging ,  with negative lung biopsy .  Oncology aware.

## 2023-01-18 ENCOUNTER — Telehealth: Payer: Self-pay

## 2023-01-18 ENCOUNTER — Encounter: Payer: Self-pay | Admitting: Internal Medicine

## 2023-01-18 NOTE — Telephone Encounter (Signed)
Please schedule appointment with Dr. Servando Snare to evaluate MRCP finding of nonobstructive filling defect, whether or not he would benefit from ERCP.  Dr. Patrice Paradise mentioned that she was going to recheck his labs CBC and CMP which were ordered.  I do not see the results yet.  Patient should get these labs done ASAP  RV

## 2023-01-18 NOTE — Telephone Encounter (Signed)
Patient called in because he doctors requested for him to have an urgent appointment with Dr. Allegra Lai. Patient stated the is PCP has already called Dr. Allegra Lai.

## 2023-01-18 NOTE — Telephone Encounter (Signed)
Patient is calling because he states that he just got out of the hospital and [facility] called him to make a hospital follow up appointment and he told them he was a establish patient with Dr. Allegra Lai. He is wanting to make a follow up appointment with you. Does it need to be next available or do we need to work him in somewhere. Patient states he needs appointment as soon as possible.

## 2023-01-18 NOTE — Telephone Encounter (Signed)
Made appointment with Dr. Servando Snare on 02/06/2023 at 2:15pm. Patient asked if there was any way we could get him in sooner. Did not see any where but if you see anything please call patient. He states he will go for labs this week

## 2023-01-19 ENCOUNTER — Other Ambulatory Visit (INDEPENDENT_AMBULATORY_CARE_PROVIDER_SITE_OTHER): Payer: PPO

## 2023-01-19 DIAGNOSIS — K8042 Calculus of bile duct with acute cholecystitis without obstruction: Secondary | ICD-10-CM

## 2023-01-19 DIAGNOSIS — E139 Other specified diabetes mellitus without complications: Secondary | ICD-10-CM

## 2023-01-19 DIAGNOSIS — E559 Vitamin D deficiency, unspecified: Secondary | ICD-10-CM | POA: Diagnosis not present

## 2023-01-19 LAB — CBC WITH DIFFERENTIAL/PLATELET
Basophils Absolute: 0.1 10*3/uL (ref 0.0–0.1)
Basophils Relative: 0.7 % (ref 0.0–3.0)
Eosinophils Absolute: 0.1 10*3/uL (ref 0.0–0.7)
Eosinophils Relative: 1.7 % (ref 0.0–5.0)
HCT: 42 % (ref 39.0–52.0)
Hemoglobin: 13.5 g/dL (ref 13.0–17.0)
Lymphocytes Relative: 39.9 % (ref 12.0–46.0)
Lymphs Abs: 2.9 10*3/uL (ref 0.7–4.0)
MCHC: 32.2 g/dL (ref 30.0–36.0)
MCV: 95.5 fl (ref 78.0–100.0)
Monocytes Absolute: 1.1 10*3/uL — ABNORMAL HIGH (ref 0.1–1.0)
Monocytes Relative: 14.6 % — ABNORMAL HIGH (ref 3.0–12.0)
Neutro Abs: 3.1 10*3/uL (ref 1.4–7.7)
Neutrophils Relative %: 43.1 % (ref 43.0–77.0)
Platelets: 363 10*3/uL (ref 150.0–400.0)
RBC: 4.4 Mil/uL (ref 4.22–5.81)
RDW: 13.9 % (ref 11.5–15.5)
WBC: 7.3 10*3/uL (ref 4.0–10.5)

## 2023-01-19 LAB — COMPREHENSIVE METABOLIC PANEL
ALT: 39 U/L (ref 0–53)
AST: 30 U/L (ref 0–37)
Albumin: 4.1 g/dL (ref 3.5–5.2)
Alkaline Phosphatase: 60 U/L (ref 39–117)
BUN: 24 mg/dL — ABNORMAL HIGH (ref 6–23)
CO2: 33 mEq/L — ABNORMAL HIGH (ref 19–32)
Calcium: 9.9 mg/dL (ref 8.4–10.5)
Chloride: 97 mEq/L (ref 96–112)
Creatinine, Ser: 0.95 mg/dL (ref 0.40–1.50)
GFR: 71.37 mL/min (ref >60.00)
Glucose, Bld: 125 mg/dL — ABNORMAL HIGH (ref 70–99)
Potassium: 4.2 mEq/L (ref 3.5–5.1)
Sodium: 137 mEq/L (ref 135–145)
Total Bilirubin: 0.5 mg/dL (ref 0.2–1.2)
Total Protein: 7.3 g/dL (ref 6.0–8.3)

## 2023-01-19 LAB — HEMOGLOBIN A1C: Hgb A1c MFr Bld: 7 % — ABNORMAL HIGH (ref 4.6–6.5)

## 2023-01-19 LAB — VITAMIN D 25 HYDROXY (VIT D DEFICIENCY, FRACTURES): VITD: 62.42 ng/mL (ref 30.00–100.00)

## 2023-01-24 ENCOUNTER — Telehealth: Payer: Self-pay

## 2023-01-24 NOTE — Telephone Encounter (Signed)
When making patient appointment to see dr. Servando Snare on 02/06/2023 he wanted to be seen sooner if there was any cancellations. I seen there was a cancellation for 01/31/2023 in [place]. Called patient and patient states he will take the appointment. He states he was getting ready to call me anyway because for the last 2 days after he eats no matter what he eats he feels really bloated and has a little indigestion. He states he does not have any real pain but wanted to let us know about it.  He states for lunch he ate some soup and a bit or two of chicken salad and he felt like he had ate a steak dinner. He states it only last for about 30 minutes and then goes away.

## 2023-01-24 NOTE — Telephone Encounter (Signed)
For bloating and indigestion which is occurring postprandial, he can try pancreatic enzymes if he has any with him at home.  He can pick up the samples if he would like  RV

## 2023-01-25 NOTE — Telephone Encounter (Signed)
Patient showed up and is wanting samples of creon gave him 2 boxes of creon

## 2023-01-25 NOTE — Telephone Encounter (Signed)
Patient states that symptoms have improved since he talk to [place]. He will let us know if it gets worse again

## 2023-01-25 NOTE — Telephone Encounter (Signed)
Pt returned call waiting on call back

## 2023-01-25 NOTE — Telephone Encounter (Signed)
Called and left a message for call back  

## 2023-01-26 ENCOUNTER — Other Ambulatory Visit: Payer: PPO

## 2023-01-31 ENCOUNTER — Ambulatory Visit: Payer: PPO | Admitting: Gastroenterology

## 2023-01-31 ENCOUNTER — Encounter: Payer: Self-pay | Admitting: Gastroenterology

## 2023-01-31 VITALS — BP 121/77 | HR 63 | Temp 97.7°F | Wt 180.0 lb

## 2023-01-31 DIAGNOSIS — R748 Abnormal levels of other serum enzymes: Secondary | ICD-10-CM

## 2023-01-31 DIAGNOSIS — K8042 Calculus of bile duct with acute cholecystitis without obstruction: Secondary | ICD-10-CM

## 2023-01-31 NOTE — Progress Notes (Signed)
Gastroenterology Consultation  Referring Provider:     [name], MD Primary Care Physician:  [name], MD Primary Gastroenterologist:  Dr. Servando Snare     Reason for Consultation:     Common bile duct filling defect        HPI:   George Owens is a 89 y.o. y/o male referred for consultation & management of common bile duct filling defect by Dr. Darrick Huntsman, Mar Daring, MD. This is a patient who had been seen in the past by GI in [facility] with an upper endoscopy for dysphagia back in 2020 the patient had imaging that was suggestive of a Zenker's diverticulum but no diverticulum was easily seen on the upper endoscopy.  The patient was then in the hospital for some chest discomfort and seen by Dr. Mia Creek.  The patient has followed up with Dr. Allegra Lai and had imaging that showed:  IMPRESSION: 1. Cholelithiasis. No suspicious enhancing gallbladder lesion identified. 2. Elongated filling defect within the mid common bile duct measuring 1.6 cm. This does not have the typical appearance of a gallstone and may represent sludge. 3. Cirrhotic morphology of the liver. No focal liver abnormality identified. 4. Pulmonary nodules compatible with metastatic disease. 5. 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 7.  Aortic Atherosclerosis  The patient reports that all of his discomfort has resolved and his most recent labs did not show any abnormal liver enzymes.  The patient has a appointment this week with the surgeon to talk about a possible cholecystectomy.  Past Medical History:  Diagnosis Date   Allergy    Aneurysm of abdominal aorta (HCC)    Basal cell carcinoma    right temple 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    Past Surgical History:  Procedure Laterality Date   BRONCHIAL BRUSHINGS  11/01/2022   Procedure: BRONCHIAL BRUSHINGS;  Surgeon: Raechel Chute, MD;  Location: [facility] ENDOSCOPY;  Service: Pulmonary;;   BRONCHIAL NEEDLE ASPIRATION BIOPSY  11/01/2022   Procedure: BRONCHIAL NEEDLE ASPIRATION BIOPSIES;  Surgeon: Raechel Chute, MD;  Location: [facility] ENDOSCOPY;  Service: Pulmonary;;   BRONCHIAL WASHINGS  11/01/2022   Procedure: BRONCHIAL WASHINGS;  Surgeon: Raechel Chute, MD;  Location[facility] ENDOSCOPY;  Service: Pulmonary;;   CATARACT EXTRACTION Right    CATARACT EXTRACTION W/PHACO Left 12/26/2017   Procedure: CATARACT EXTRACTION PHACO AND INTRAOCULAR LENS PLACEMENT (IOC)  COMPLICATED LEFT;  Surgeon: Lockie Mola, MD;  Location: [facility];  Service: Ophthalmology;  Laterality: Left;  MALYUGIN VISION BLUE   colonoscopy     ESOPHAGOGASTRODUODENOSCOPY (EGD) WITH PROPOFOL     EYE MUSCLE SURGERY Left    IR KYPHO THORACIC WITH BONE BIOPSY  11/11/2021   PORTA CATH INSERTION N/A 10/19/2020   Procedure: PORTA CATH INSERTION;  Surgeon: Annice Needy, MD;  Location: [facility] INVASIVE CV LAB;  Service: Cardiovascular;  Laterality: N/A;   TONSILLECTOMY AND ADENOIDECTOMY      Prior to Admission medications   Medication Sig Start Date End Date Taking? Authorizing Provider  acetaminophen (TYLENOL) 500 MG tablet Take 1,000 mg by mouth every  6 (six) hours as needed for moderate pain.    [provider]  Blood Glucose Monitoring Suppl (ONETOUCH VERIO) w/Device KIT Please use to check blood sugars once daily. Dx code: E11.65 08/09/22   [name], MD  Blood Glucose Monitoring Suppl DEVI 1 each by Does not apply route daily. May substitute to any manufacturer covered by patient's insurance. 08/03/22   [name], MD  Calcium Carb-Cholecalciferol (CALCIUM 600 + D PO) Take 1 tablet by mouth in the morning.     [provider]  cholecalciferol (VITAMIN D3) 25 MCG (1000 UNIT) tablet Take 1,000 Units by mouth in the morning.    [provider]  Cranberry 500 MG CAPS Take 500 mg by mouth in the morning.    [provider]  denosumab (PROLIA) 60 MG/ML SOSY injection Inject 60 mg into the skin every 6 (six) months. 09/23/22   [name], MD  Glucose Blood (BLOOD GLUCOSE TEST STRIPS) STRP May substitute to any manufacturer covered by patient's insurance.  Use to  check blood sugar once daily at variable times 08/03/22   [name], MD  glucose blood (ONETOUCH VERIO) test strip Use to check blood sugars one time daily. Dx code: E11.65 08/09/22   [name], MD  Lancets [facility] ULTRASOFT) lancets Use to check blood sugars once daily. Dx code: E11.65 08/09/22   [name], MD  loratadine (CLARITIN) 10 MG tablet Take 10 mg by mouth daily as needed for allergies.    [provider]  metoprolol succinate (TOPROL-XL) 25 MG 24 hr tablet Take 25 mg by mouth in the morning.    [provider]  Misc Natural Products (JOINT HEALTH PO) Take 1 tablet by mouth every evening.    [provider]  naproxen sodium (ALEVE) 220 MG tablet Take 220 mg by mouth daily as needed.    [provider]  omeprazole (PRILOSEC) 40 MG capsule TAKE 1 CAPSULE (40 MG TOTAL) BY MOUTH DAILY. 04/26/22   Toney Reil, MD  Polyethyl Glycol-Propyl Glycol (SYSTANE OP) Place 1 drop into both eyes 2 (two) times daily as needed (dry/irritated eyes.).    [provider]  tamsulosin (FLOMAX) 0.4 MG CAPS capsule 1 capsule daily 10/24/22   [name], MD  vitamin B-12 (CYANOCOBALAMIN) 1000 MCG tablet Take 1,000 mcg by mouth every evening.    [provider]    Family History  Problem Relation Age of Onset   Stroke Mother    Arthritis Father    Heart disease Father    Brain cancer Brother        1/2 brother (possibly related to mother NOT related to  patient)   Cancer Cousin        unk type   Lymphoma Cousin        vs leukemia   Colon cancer Neg Hx    Esophageal cancer Neg Hx    Pancreatic cancer Neg Hx    Stomach cancer Neg Hx    Liver disease Neg Hx      Social History   Tobacco Use   Smoking status: Never   Smokeless tobacco: Never  Vaping Use   Vaping status: Never Used  Substance Use Topics   Alcohol use: No   Drug use: No    Allergies as of 01/31/2023 - Review Complete 01/16/2023  Allergen Reaction Noted   Kenalog [triamcinolone acetonide]  12/13/2012    Review of Systems:    All  systems reviewed and negative except where noted in HPI.   Physical Exam:  There were no vitals taken for this visit. No LMP for male patient. General:   Alert,  Well-developed, well-nourished, pleasant and cooperative in NAD Head:  Normocephalic and atraumatic. Eyes:  Sclera clear, no icterus.   Conjunctiva pink. Ears:  Normal auditory acuity. Neurologic:  Alert and oriented x3;  grossly normal neurologically. Skin:  Intact without significant lesions or rashes.  No jaundice. Psych:  Alert and cooperative. Normal mood and affect.  Imaging Studies: MR 3D Recon At Scanner  Result Date: 01/11/2023 : CLINICAL DATA: Evaluate gallbladder nodularity. EXAM: MRI ABDOMEN WITHOUT AND WITH CONTRAST (INCLUDING MRCP) TECHNIQUE: Multiplanar multisequence MR imaging of the abdomen was performed both before and after the administration of intravenous contrast. Heavily T2-weighted images of the biliary and pancreatic ducts were obtained, and three-dimensional MRCP images were rendered by post processing. CONTRAST: 8mL GADAVIST GADOBUTROL 1 MMOL/ML IV SOLN COMPARISON: CT from earlier today FINDINGS: Lower chest: Pulmonary nodules are again seen compatible with metastatic disease. The largest is in the posterior right base measuring 1.3 cm, image 8/4. Hepatobiliary: Mild diffuse hepatic steatosis. No focal liver abnormality identified. Cirrhotic  morphology of the liver is noted with the hypertrophy of the lateral segment of left hepatic lobe and caudate lobe. Macro lobular contour the liver noted. Stone within the dependent portion of the gallbladder measures 7 mm scratch stones are identified within the gallbladder neck measuring up to 7 mm. No suspicious enhancing gallbladder lesion identified. Common bile duct measures 8 mm in diameter. Elongated filling, nonenhancing defect within the mid common bile duct is identified measuring 1.6 cm, image 50/20. This does not have the typical appearance of a gallstone in may represent sludge. Pancreas: Status post distal pancreatectomy. No main duct dilatation, inflammation or suspicious mass. Spleen: Status post splenectomy. Adrenals/Urinary Tract: Normal adrenal glands. No nephrolithiasis, hydronephrosis or suspicious mass. Tiny cyst within upper pole of right kidney measures 7 mm. No follow-up imaging recommended. Similarly there is a simple cyst off the inferior pole of left kidney measuring 1 cm, image 1228/4. No follow-up imaging recommended. Stomach/Bowel: Visualized portions within the abdomen are unremarkable. Vascular/Lymphatic: Normal caliber of the abdominal aorta. Aortic atherosclerotic calcifications. 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 No abdominal adenopathy. Other: No ascites or focal fluid collections. Musculoskeletal: Moderate compression deformity is again seen involving the T9 and L2 vertebra. No enhancing bone lesions identified. IMPRESSION: 1. Cholelithiasis. No suspicious enhancing gallbladder lesion identified. 2. Elongated filling defect within the mid common bile duct measuring 1.6 cm. This does not have the typical appearance of a gallstone and may represent sludge. 3. Cirrhotic morphology of the liver. No focal liver abnormality identified. 4. Pulmonary nodules compatible with metastatic disease. 5. Status post distal pancreatectomy and splenectomy.  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 7. Aortic Atherosclerosis (ICD10-I70.0). Electronically Signed   By: Signa Kell M.D.   On: 01/11/2023 15:28   MR ABDOMEN MRCP W WO CONTAST  Result Date: 01/10/2023 CLINICAL DATA:  Evaluate gallbladder nodularity. EXAM: MRI ABDOMEN WITHOUT AND WITH CONTRAST (INCLUDING MRCP) TECHNIQUE: Multiplanar multisequence MR imaging of the abdomen was performed both before and after the administration of intravenous contrast. Heavily T2-weighted images of the biliary and pancreatic ducts were obtained, and three-dimensional MRCP images were rendered by post processing. CONTRAST:  8mL GADAVIST GADOBUTROL 1 MMOL/ML IV SOLN COMPARISON:  CT from earlier today FINDINGS: Lower chest: Pulmonary nodules are again seen  compatible with metastatic disease. The largest is in the posterior right base measuring 1.3 cm, image 8/4. Hepatobiliary: Mild diffuse hepatic steatosis. No focal liver abnormality identified. Cirrhotic morphology of the liver is noted with the hypertrophy of the lateral segment of left hepatic lobe and caudate lobe. Macro lobular contour the liver noted. Stone within the dependent portion of the gallbladder measures 7 mm scratch stones are identified within the gallbladder neck measuring up to 7 mm. No suspicious enhancing gallbladder lesion identified. Common bile duct measures 8 mm in diameter. Elongated filling, nonenhancing defect within the mid common bile duct is identified measuring 1.6 cm, image 50/20. This does not have the typical appearance of a gallstone in may represent sludge. Pancreas: Status post distal pancreatectomy. No main duct dilatation, inflammation or suspicious mass. Spleen:  Status post splenectomy. Adrenals/Urinary Tract: Normal adrenal glands. No nephrolithiasis, hydronephrosis or suspicious mass. Tiny cyst within upper pole of right kidney measures 7 mm. No follow-up imaging recommended. Similarly there is a simple cyst off the inferior  pole of left kidney measuring 1 cm, image 1228/4. No follow-up imaging recommended. Stomach/Bowel: Visualized portions within the abdomen are unremarkable. Vascular/Lymphatic: Normal caliber of the abdominal aorta. Aortic atherosclerotic calcifications. 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 No abdominal adenopathy. Other:  No ascites or focal fluid collections. Musculoskeletal: Moderate compression deformity is again seen involving the T9 and L2 vertebra. No enhancing bone lesions identified. IMPRESSION: 1. Cholelithiasis. No suspicious enhancing gallbladder lesion identified. 2. Elongated filling defect within the mid common bile duct measuring 1.6 cm. This does not have the typical appearance of a gallstone and may represent sludge. 3. Cirrhotic morphology of the liver. No focal liver abnormality identified. 4. Pulmonary nodules compatible with metastatic disease. 5. 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 7.  Aortic Atherosclerosis (ICD10-I70.0). Electronically Signed   By: Signa Kell M.D.   On: 01/10/2023 15:54   US ABDOMEN LIMITED RUQ (LIVER/GB)  Result Date: 01/10/2023 CLINICAL DATA:  Transaminitis. Previous partial pancreatic resection for neoplasm EXAM: ULTRASOUND ABDOMEN LIMITED RIGHT UPPER QUADRANT COMPARISON:  None Available. FINDINGS: Gallbladder: Distended gallbladder with some nodularity posteriorly without clear shadowing. These could be nonshadowing stones versus other nodules are polyps. Common bile duct: Diameter: 3 mm Liver: Diffusely echogenic hepatic parenchyma consistent with some fatty liver infiltration. Please correlate with the prior contrast CT scan. Portal vein is patent on color Doppler imaging with normal direction of blood flow towards the liver. Other: None. IMPRESSION: Distended gallbladder with some nodularity posteriorly along the wall. Not clearly stones. Recommend further workup  when appropriate such as with and without contrast MRI. No biliary ductal dilatation. Electronically Signed   By: Karen Kays M.D.   On: 01/10/2023 13:14   CT ABDOMEN PELVIS W CONTRAST  Result Date: 01/10/2023 CLINICAL DATA:  Abdominal pain. Transaminitis. History of pancreatic cancer. Pathologic spinal fracture. EXAM: CT ABDOMEN AND PELVIS WITH CONTRAST TECHNIQUE: Multidetector CT imaging of the abdomen and pelvis was performed using the standard protocol following bolus administration of intravenous contrast. RADIATION DOSE REDUCTION: This exam was performed according to the departmental dose-optimization program which includes automated exposure control, adjustment of the mA and/or kV according to patient size and/or use of iterative reconstruction technique. CONTRAST:  OMNIPAQUE IOHEXOL 300 MG/ML  SOLN COMPARISON:  CT 10/07/2022 abdomen pelvis.  Chest CT 10/27/2022. FINDINGS: Lower chest: Multiple pulmonary nodules again identified consistent with known metastatic disease. Example nodule right lung  base posteromedial with the transverse dimension today of 12 mm. On the prior abdomen pelvis CT 7 mm in the more recent chest CT 11 mm. Hepatobiliary: Stable punctate calcification in the inferior aspect of the right hepatic lobe. No other space-occupying liver lesion at this time. Patent portal vein. Gallbladder distended with some gallbladder wall thickening and some dependent nodularity. Please correlate for any known history. Pancreas: Surgical changes of distal pancreatectomy. Residual pancreatic head and neck with some atrophy. No obvious mass in this location. Spleen: Spleen is surgically absent. Area of fat necrosis in the surgical bed. Adrenals/Urinary Tract: Adrenal glands are preserved. Stable Bosniak 1 exophytic lower pole lateral left-sided renal cyst. No enhancing renal mass or collecting system dilatation. No specific imaging follow-up of the benign cysts. Similar upper pole focus on the  right. The ureters have normal course and caliber down to the bladder. Preserved contours of the urinary bladder. Stomach/Bowel: No oral contrast. Large bowel is of normal course and caliber with scattered stool. Normal appendix. Stomach and small bowel are nondilated. Vascular/Lymphatic: Normal caliber IVC. Diffuse vascular calcifications along the aorta as some noncalcified plaque. 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 Similar to previous when adjusting for technique. No developing new lymph node enlargement identified in the abdomen and pelvis. Reproductive: Radiation seeds along the prostate. Other: No free air or free fluid. Musculoskeletal: Curvature of the spine with degenerative changes. Degenerative changes of the pelvis. Moderate compression of the superior endplate of L2 is stable. More significant compression is seen at the edge of the imaging field, incompletely, at T9. Stable sclerotic lesion along the right femur. 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 Distended gallbladder with stones and possible wall thickening. If there is further concern of acute gallbladder pathology, an ultrasound may be useful as the next step in the workup. 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 Electronically Signed   By: Karen Kays M.D.   On: 01/10/2023 11:05   DG Chest Port 1 View  Result Date: 01/10/2023 CLINICAL DATA:  chest pain EXAM: PORTABLE CHEST 1 VIEW COMPARISON:  CXR 11/01/22 FINDINGS: Right-sided chest port in place with tip at cavoatrial junction. No pleural effusion. No pneumothorax. Unchanged cardiac and mediastinal contours. No focal airspace opacity. No radiographically apparent displaced rib fractures. Visualized upper abdomen is unremarkable. IMPRESSION: No focal airspace opacity Electronically Signed   By: Lorenza Cambridge M.D.   On: 01/10/2023 09:04    Assessment  and Plan:   George Owens is a 89 y.o. y/o male who had an MRCP showing sludge in the common bile duct.  The patient is 89 years old and frail and has been told the risks and benefits of an ERCP.  The patient has been requested to have a repeat MRI to see if the sludge has passed since his liver enzymes have come back to normal and he feels well.  If his MRCP does not show any further sludge then he will follow-up with the surgeon and decide whether or not it is wise to get his gallbladder.  The patient and his wife understand that if the MRCP is positive for continued sludge in the common bile duct that a ERCP would be warranted.  The patient had a distal pancreatectomy for pancreatic ductal adenocarcinoma but after reading the procedure report it does not appear that the patient had any change in his anatomy as far as the common bile  duct/ampulla is concerned.  The patient will be notified with the MRCP results.    Midge Minium, MD. Clementeen Graham    Note: This dictation was prepared with Dragon dictation along with smaller phrase technology. Any transcriptional errors that result from this process are unintentional.

## 2023-02-06 ENCOUNTER — Other Ambulatory Visit: Payer: Self-pay | Admitting: Gastroenterology

## 2023-02-06 ENCOUNTER — Ambulatory Visit
Admission: RE | Admit: 2023-02-06 | Discharge: 2023-02-06 | Disposition: A | Discharge status: Home / Self Care | Payer: PPO | Source: Ambulatory Visit | Attending: Gastroenterology | Admitting: Gastroenterology

## 2023-02-06 ENCOUNTER — Ambulatory Visit: Payer: PPO | Admitting: Gastroenterology

## 2023-02-06 DIAGNOSIS — K8042 Calculus of bile duct with acute cholecystitis without obstruction: Secondary | ICD-10-CM | POA: Insufficient documentation

## 2023-02-06 DIAGNOSIS — K8020 Calculus of gallbladder without cholecystitis without obstruction: Secondary | ICD-10-CM | POA: Diagnosis not present

## 2023-02-06 DIAGNOSIS — K8689 Other specified diseases of pancreas: Secondary | ICD-10-CM | POA: Diagnosis not present

## 2023-02-06 MED ORDER — GADOBUTROL 1 MMOL/ML IV SOLN
7.5000 mL | Freq: Once | INTRAVENOUS | Status: AC | PRN
  Administered 2023-02-06: 7.5 mL via INTRAVENOUS

## 2023-02-08 ENCOUNTER — Telehealth: Payer: Self-pay | Admitting: Gastroenterology

## 2023-02-08 ENCOUNTER — Inpatient Hospital Stay: Payer: PPO | Admitting: Internal Medicine

## 2023-02-08 NOTE — Telephone Encounter (Signed)
I called radiology and asked if they could expedite the reading of pt's MRI... I called pt to let him know that I will call him as soon as I get a response following the interpretation of the MRI

## 2023-02-08 NOTE — Telephone Encounter (Signed)
Patient called in he wants to speak to melanie about his GI problem.

## 2023-02-09 ENCOUNTER — Telehealth: Payer: Self-pay | Admitting: *Deleted

## 2023-02-09 ENCOUNTER — Other Ambulatory Visit: Payer: Self-pay | Admitting: *Deleted

## 2023-02-09 DIAGNOSIS — R918 Other nonspecific abnormal finding of lung field: Secondary | ICD-10-CM

## 2023-02-09 DIAGNOSIS — K8020 Calculus of gallbladder without cholecystitis without obstruction: Secondary | ICD-10-CM

## 2023-02-09 DIAGNOSIS — C251 Malignant neoplasm of body of pancreas: Secondary | ICD-10-CM

## 2023-02-09 DIAGNOSIS — K8044 Calculus of bile duct with chronic cholecystitis without obstruction: Secondary | ICD-10-CM | POA: Diagnosis not present

## 2023-02-09 NOTE — Telephone Encounter (Signed)
Called to Dr. Smith George Owens to let her know that the patient has called in saying that he has gallstones and with the scan that they showed that lung nodules were getting bigger.  He wanted to have Dr. Assunta Gambles input.  I sent the message to Dr. Smith George Owens and she said to cancel the scan in September and to get him a PET scan in the next 1 to 2 weeks.  The [place] 29[place]  [place] [facility] [place] but he will need to give in 30 minutes earlier so 11:30 AM.  Also he cannot eat or drink for 6 hours prior except for water only.  Patient understands and he will be there on that date.  I will speak to George Owens about the next appointment for him after the PET scan.  Patient is okay.

## 2023-02-15 ENCOUNTER — Other Ambulatory Visit: Payer: PPO

## 2023-02-16 ENCOUNTER — Ambulatory Visit
Admission: RE | Admit: 2023-02-16 | Discharge: 2023-02-16 | Disposition: A | Discharge status: Home / Self Care | Payer: PPO | Source: Ambulatory Visit | Attending: Oncology | Admitting: Oncology

## 2023-02-16 DIAGNOSIS — K8020 Calculus of gallbladder without cholecystitis without obstruction: Secondary | ICD-10-CM | POA: Diagnosis not present

## 2023-02-16 DIAGNOSIS — C251 Malignant neoplasm of body of pancreas: Secondary | ICD-10-CM | POA: Diagnosis not present

## 2023-02-16 DIAGNOSIS — R918 Other nonspecific abnormal finding of lung field: Secondary | ICD-10-CM | POA: Diagnosis not present

## 2023-02-16 DIAGNOSIS — C259 Malignant neoplasm of pancreas, unspecified: Secondary | ICD-10-CM | POA: Diagnosis not present

## 2023-02-16 LAB — GLUCOSE, CAPILLARY: Glucose-Capillary: 108 mg/dL — ABNORMAL HIGH (ref 70–99)

## 2023-02-16 MED ORDER — FLUDEOXYGLUCOSE F - 18 (FDG) INJECTION
9.3000 | Freq: Once | INTRAVENOUS | Status: AC | PRN
  Administered 2023-02-16: 9.81 via INTRAVENOUS

## 2023-02-22 ENCOUNTER — Ambulatory Visit: Payer: Self-pay | Admitting: General Surgery

## 2023-02-22 NOTE — H&P (View-Only) (Signed)
PATIENT PROFILE: George Owens is a 89 y.o. male who presents to the Clinic for consultation at the request of Dr. Eli Phillips for evaluation of cholelithiasis.  PCP: [facility], [facility]  HISTORY OF PRESENT ILLNESS: George Owens reports she was recently admitted to the hospital due to choledocholithiasis. During the hospitalization patient had elevated bilirubin. MRCP showed sludge in the common bile duct. Due to unavailable ERCP during his admission, I coordinated transfer to [facility] but patient refused the transfer. Fortunately for the patient his choledocholithiasis resolved. He was discharged home. He was evaluated by GI specialist and MRCP was repeated. MRCP showed no choledocholithiasis. Liver enzymes and bilirubin are normal now. Patient denies any abdominal pain. Patient endorses resolution of the symptoms.  Patient is very concerned about persisting and increasing pulmonary nodules seen on the MRCP. As per patient he had a biopsy before but he was told that the sample was not the expected target.  PROBLEM LIST: Problem List Never Reviewed   Noted  Lung nodule 11/23/2022  Paroxysmal A-fib (CMS/HHS-HCC) 03/22/2021  Pancreas cancer (CMS/HHS-HCC) 02/25/2021  Hyperlipidemia 02/10/2021  Overview  Last Assessment & Plan:  Formatting of this note is different from the original. Untreated due to severe statin related myalgias  Lab Results  Component Value Date  CHOL 198 04/19/2019  HDL 41 04/19/2019  LDLCALC 128 (H) 04/19/2019  LDLDIRECT 129.0 03/31/2017  TRIG 171 (H) 04/19/2019  CHOLHDL 4.8 04/19/2019   Malignant neoplasm of body of pancreas (CMS/HHS-HCC) 09/18/2020  Cirrhosis of liver (CMS/HHS-HCC) 05/12/2020  Overview  Last Assessment & Plan:  Formatting of this note might be different from the original. Compensated. Reassured that treatment for pancreatic CA takes priority over any treatment for cirrhosis   Thoracic ascending aortic aneurysm (CMS-HCC) 04/29/2020  Overview  Last  Assessment & Plan:  Formatting of this note might be different from the original. Measured at 4.7 Cm. He is scheduled to have a CT angiogram in late May , Which appears to have been ordered by Dr Vickey Sages prior to his diagnosis of pancreatic cancer was known. He should not need to go at this time.   GERD (gastroesophageal reflux disease) 04/07/2018  Overview  Last Assessment & Plan:  Formatting of this note might be different from the original. Reducing omeprazole dose to once daily   Spinal stenosis of lumbar region 02/26/2014  Overview  Formatting of this note might be different from the original. L4-L5 , mild to moderate and L3-L4 mild, right greater than left by march 2015 MRI done at [facility]   Last Assessment & Plan:  Formatting of this note might be different from the original. Reassured by benign exam and absence of back pain during OV. However in context of h/o osteoporosis and compression fracture, will pursue imaging. Will decide after results next steps. Advised conservative therapy while we await results.   Vitamin D deficiency 09/10/2013  Overview  Formatting of this note might be different from the original. Prescribing Drisdol  Last Assessment & Plan:  Formatting of this note might be different from the original. Drisdol prescribed for low D in the setting of osteoporosis.   History of prostate cancer 03/01/2013  Overview  Formatting of this note might be different from the original. Diagnosed in July 2006 with PSA of 9 , S/p Lupron followed by XRT . PSA was .04 in 2010 and 0.68 in 2014.   Last Assessment & Plan:  Formatting of this note is different from the original. PSA remains detectable by low and stable  Lab Results  Component Value Date  PSA 0.51 04/27/2020  PSA 0.6 04/19/2019  PSA 0.54 04/04/2018    GENERAL REVIEW OF SYSTEMS:   General ROS: negative for - chills, fatigue, fever, weight gain or weight loss Allergy and Immunology ROS: negative for -  hives  Hematological and Lymphatic ROS: negative for - bleeding problems or bruising, negative for palpable nodes Endocrine ROS: negative for - heat or cold intolerance, hair changes Respiratory ROS: negative for - cough, shortness of breath or wheezing Cardiovascular ROS: no chest pain or palpitations GI ROS: negative for nausea, vomiting, abdominal pain, diarrhea, constipation Musculoskeletal ROS: negative for - joint swelling or muscle pain Neurological ROS: negative for - confusion, syncope Dermatological ROS: negative for pruritus and rash Psychiatric: negative for anxiety, depression, difficulty sleeping and memory loss  MEDICATIONS: Current Outpatient Medications  Medication Sig Dispense Refill  acetaminophen (TYLENOL) 325 MG tablet Take 650 mg by mouth every 4 (four) hours as needed for Pain  ALPRAZolam (XANAX) 0.5 MG tablet Take 0.5 tablets (0.25 mg total) by mouth at bedtime 30 tablet 0  calcium carbonate (CALCIUM 500 ORAL) Take 600 mg by mouth once daily  cholecalciferol (VITAMIN D3) 2,000 unit capsule Take 2,000 Units by mouth once daily  cranberry 500 mg Cap Take by mouth  cranberry fruit concentrate (AZO CRANBERRY ORAL) Take 2 tablets by mouth once daily  cyanocobalamin (VITAMIN B12) 1000 MCG tablet Take 1,000 mcg by mouth 2 (two) times daily  denosumab (PROLIA SUBQ) Inject 60 mg subcutaneously every 6 (six) months  glucosamine HCl/chondroitin su (GLUCOSAMINE-CHONDROITIN ORAL) Take 1 tablet by mouth once daily  lidocaine-prilocaine (EMLA) cream Apply topically as needed (port access)  loratadine (CLARITIN) 10 mg tablet Take 10 mg by mouth once daily as needed  metoprolol succinate (TOPROL-XL) 50 MG XL tablet TAKE 1.5 TABLETS (75 MG TOTAL) BY MOUTH ONCE DAILY 135 tablet 1  omeprazole (PRILOSEC) 40 MG DR capsule Take 40 mg by mouth once daily  pancrelipase (CREON) 36,000-114,000-180,000 unit DR capsule Take 2 capsules by mouth 3 (three) times daily with meals And 1 capsule with  snacks  pantoprazole (PROTONIX) 40 MG DR tablet Take 1 tablet by mouth 2 (two) times daily  pramoxine-hydrocortisone 1-1 % rectal cream Place rectally as needed for Hemorrhoids  ranibizumab (LUCENTIS IZ) by Intravitreal route every 3 (three) months  tamsulosin (FLOMAX) 0.4 mg capsule Take 1 capsule (0.4 mg total) by mouth once daily Take 30 minutes after same meal each day. 30 capsule  temazepam (RESTORIL) 22.5 MG capsule TAKE 1 CAPSULE (22.5 MG TOTAL) BY MOUTH AT BEDTIME AS NEEDED FOR SLEEP.  traZODone (DESYREL) 50 MG tablet Take 50 mg by mouth at bedtime   No current facility-administered medications for this visit.   ALLERGIES: Kenalog [triamcinolone acetonide]  PAST MEDICAL HISTORY: Past Medical History:  Diagnosis Date  GERD (gastroesophageal reflux disease)  History of cancer  Lung nodule 11/23/2022   PAST SURGICAL HISTORY: Past Surgical History:  Procedure Laterality Date  CATARACT EXTRACTION 2017  UPPER GASTROINTESTINAL ENDOSCOPY N/A 09/17/2020  Procedure: UPPER EUS;; Surgeon: Jeanice Lim, MD; Location: [facility]; Service: Gastroenterology; Laterality: N/A;  Port placed 10/12/2020  UNLISTED PROCEDURE PANCREAS N/A 02/25/2021  Procedure: ROBOT ASSISTED LAPAROSCOPY, SURGICAL; PANCREATECTOMY DISTAL; Surgeon: Rhina Brackett, MD; Location: [facility]; Service: General Surgery; Laterality: N/A;  LAPAROSCOPIC SPLENECTOMY N/A 02/25/2021  Procedure: ROBOT ASSISTED LAPAROSCOPY, SURGICAL; SPLENECTOMY; Surgeon: Rhina Brackett, MD; Location: [facility]; Service: General Surgery; Laterality: N/A;  COLONOSCOPY  EGD  FAMILY HISTORY: Family History  Problem Relation Name Age of Onset  Anesthesia problems Neg Hx  Malignant hyperthermia Neg Hx    SOCIAL HISTORY: Social History   Socioeconomic History  Marital status: Married  Tobacco Use  Smoking status: Never  Smokeless tobacco: Never  Vaping Use  Vaping status: Never Used  Substance  and Sexual Activity  Alcohol use: Not Currently  Drug use: Never   Social Determinants of Health   Financial Resource Strain: Low Risk (04/13/2020)  Received from [facility]  Overall Financial Resource Strain (CARDIA)  Difficulty of Paying Living Expenses: Not hard at all  Food Insecurity: No Food Insecurity (01/13/2023)  [facility]  Hunger Vital Sign  Worried About Running Out of Food in the Last Year: Never true  Ran Out of Food in the Last Year: Never true  Transportation Needs: No Transportation Needs (01/13/2023)  Received [facility] - [facility]  Lack of Transportation (Medical): No  Lack of Transportation (Non-Medical): No   PHYSICAL EXAM: Vitals:  02/09/23 0834  BP: 112/73  Pulse: 75   Body mass index is 22.5 kg/m. Weight: 81.6 kg (180 lb)   GENERAL: Alert, active, oriented x3  HEENT: Pupils equal reactive to light. Extraocular movements are intact. Sclera clear. Palpebral conjunctiva normal red color.Pharynx clear.  NECK: Supple with no palpable mass and no adenopathy.  LUNGS: Sound clear with no rales rhonchi or wheezes.  HEART: Regular rhythm S1 and S2 without murmur.  ABDOMEN: Soft and depressible, nontender with no palpable mass, no hepatomegaly.   EXTREMITIES: Well-developed well-nourished symmetrical with no dependent edema.  NEUROLOGICAL: Awake alert oriented, facial expression symmetrical, moving all extremities.  REVIEW OF DATA: I have reviewed the following data today: No visits with results within 3 Month(s) from this visit.  Latest known visit with results is:  Office Visit on 10/24/2022  Component Date Value  Vent Rate (bpm) 10/24/2022 68  PR Interval (msec) 10/24/2022 196  QRS Interval (msec) 10/24/2022 94  QT Interval (msec) 10/24/2022 398  QTc (msec) 10/24/2022 423    ASSESSMENT: Mr. Vieth is a 89 y.o. male presenting for consultation for cholelithiasis with history of choledocholithiasis.    Patient was oriented about the diagnosis of cholelithiasis. Also oriented about what is the gallbladder, its anatomy and function and the implications of having stones. The patient was oriented about the treatment alternatives (observation vs cholecystectomy). Patient was oriented that a low percentage of patient will continue to have similar pain symptoms even after the gallbladder is removed. Surgical technique (open vs laparoscopic) was discussed. It was also discussed the goals of the surgery (decrease the pain episodes and avoid the risk of cholecystitis) and the risk of surgery including: bleeding, infection, common bile duct injury, stone retention, injury to other organs such as bowel, liver, stomach, other complications such as hernia, bowel obstruction among others. Also discussed with patient about anesthesia and its complications such as: reaction to medications, pneumonia, heart complications, death, among others.   I personally evaluated the new MRCP. MRCP with normal common bile duct without filling defect. Normal bilirubin and liver enzymes. I had a long discussion with the patient about the recommendation of cholecystectomy to prevent recurrent episode.  Currently the patient is more concerned about his pulmonary nodules. I think that this is reasonable. Patient with history of pancreatic cancer. There is concerned about primary versus metastatic lung cancer.  He also endorses that he has a trip planned in 3 weeks that he does  not want to miss.  I discussed with patient and his wife to contact us whenever he feels ready to proceed with cholecystectomy.  Choledocholithiasis with chronic cholecystitis [K80.44]  PLAN: 1. Robotic assisted laparoscopic cholecystectomy  Patient and his wife verbalized understanding, all questions were answered, and were agreeable with the plan outlined above.   Carolan Shiver, MD

## 2023-02-22 NOTE — H&P (Signed)
PATIENT PROFILE: George Owens is a 89 y.o. male who presents to the Clinic for consultation at the request of Dr. Eli Phillips for evaluation of cholelithiasis.  PCP: [facility]  HISTORY OF PRESENT ILLNESS: [name] reports she was recently admitted to the hospital due to choledocholithiasis. During the hospitalization patient had elevated bilirubin. MRCP showed sludge in the common bile duct. Due to unavailable ERCP during his admission, I coordinated transfer to [facility] but patient refused the transfer. Fortunately for the patient his choledocholithiasis resolved. He was discharged home. He was evaluated by GI specialist and MRCP was repeated. MRCP showed no choledocholithiasis. Liver enzymes and bilirubin are normal now. Patient denies any abdominal pain. Patient endorses resolution of the symptoms.  Patient is very concerned about persisting and increasing pulmonary nodules seen on the MRCP. As per patient he had a biopsy before but he was told that the sample was not the expected target.  PROBLEM LIST: Problem List Never Reviewed   Noted  Lung nodule 11/23/2022  Paroxysmal A-fib (CMS/HHS-HCC) 03/22/2021  Pancreas cancer (CMS/HHS-HCC) 02/25/2021  Hyperlipidemia 02/10/2021  Overview  Last Assessment & Plan:  Formatting of this note is different from the original. Untreated due to severe statin related myalgias  Lab Results  Component Value Date  CHOL 198 04/19/2019  HDL 41 04/19/2019  LDLCALC 128 (H) 04/19/2019  LDLDIRECT 129.0 03/31/2017  TRIG 171 (H) 04/19/2019  CHOLHDL 4.8 04/19/2019   Malignant neoplasm of body of pancreas (CMS/HHS-HCC) 09/18/2020  Cirrhosis of liver (CMS/HHS-HCC) 05/12/2020  Overview  Last Assessment & Plan:  Formatting of this note might be different from the original. Compensated. Reassured that treatment for pancreatic CA takes priority over any treatment for cirrhosis   Thoracic ascending aortic aneurysm (CMS-HCC) 04/29/2020  Overview  Last  Assessment & Plan:  Formatting of this note might be different from the original. Measured at 4.7 Cm. He is scheduled to have a CT angiogram in late May , Which appears to have been ordered by Dr Vickey Sages prior to his diagnosis of pancreatic cancer was known. He should not need to go at this time.   GERD (gastroesophageal reflux disease) 04/07/2018  Overview  Last Assessment & Plan:  Formatting of this note might be different from the original. Reducing omeprazole dose to once daily   Spinal stenosis of lumbar region 02/26/2014  Overview  Formatting of this note might be different from the original. L4-L5 , mild to moderate and L3-L4 mild, right greater than left by march 2015 MRI done at [facility]   Last Assessment & Plan:  Formatting of this note might be different from the original. Reassured by benign exam and absence of back pain during OV. However in context of h/o osteoporosis and compression fracture, will pursue imaging. Will decide after results next steps. Advised conservative therapy while we await results.   Vitamin D deficiency 09/10/2013  Overview  Formatting of this note might be different from the original. Prescribing Drisdol  Last Assessment & Plan:  Formatting of this note might be different from the original. Drisdol prescribed for low D in the setting of osteoporosis.   History of prostate cancer 03/01/2013  Overview  Formatting of this note might be different from the original. Diagnosed in July 2006 with PSA of 9 , S/p Lupron followed by XRT . PSA was .04 in 2010 and 0.68 in 2014.   Last Assessment & Plan:  Formatting of this note is different from the original. PSA remains detectable by low and stable  Lab Results  Component Value Date  PSA 0.51 04/27/2020  PSA 0.6 04/19/2019  PSA 0.54 04/04/2018    GENERAL REVIEW OF SYSTEMS:   General ROS: negative for - chills, fatigue, fever, weight gain or weight loss Allergy and Immunology ROS: negative for -  hives  Hematological and Lymphatic ROS: negative for - bleeding problems or bruising, negative for palpable nodes Endocrine ROS: negative for - heat or cold intolerance, hair changes Respiratory ROS: negative for - cough, shortness of breath or wheezing Cardiovascular ROS: no chest pain or palpitations GI ROS: negative for nausea, vomiting, abdominal pain, diarrhea, constipation Musculoskeletal ROS: negative for - joint swelling or muscle pain Neurological ROS: negative for - confusion, syncope Dermatological ROS: negative for pruritus and rash Psychiatric: negative for anxiety, depression, difficulty sleeping and memory loss  MEDICATIONS: Current Outpatient Medications  Medication Sig Dispense Refill  acetaminophen (TYLENOL) 325 MG tablet Take 650 mg by mouth every 4 (four) hours as needed for Pain  ALPRAZolam (XANAX) 0.5 MG tablet Take 0.5 tablets (0.25 mg total) by mouth at bedtime 30 tablet 0  calcium carbonate (CALCIUM 500 ORAL) Take 600 mg by mouth once daily  cholecalciferol (VITAMIN D3) 2,000 unit capsule Take 2,000 Units by mouth once daily  cranberry 500 mg Cap Take by mouth  cranberry fruit concentrate (AZO CRANBERRY ORAL) Take 2 tablets by mouth once daily  cyanocobalamin (VITAMIN B12) 1000 MCG tablet Take 1,000 mcg by mouth 2 (two) times daily  denosumab (PROLIA SUBQ) Inject 60 mg subcutaneously every 6 (six) months  glucosamine HCl/chondroitin su (GLUCOSAMINE-CHONDROITIN ORAL) Take 1 tablet by mouth once daily  lidocaine-prilocaine (EMLA) cream Apply topically as needed (port access)  loratadine (CLARITIN) 10 mg tablet Take 10 mg by mouth once daily as needed  metoprolol succinate (TOPROL-XL) 50 MG XL tablet TAKE 1.5 TABLETS (75 MG TOTAL) BY MOUTH ONCE DAILY 135 tablet 1  omeprazole (PRILOSEC) 40 MG DR capsule Take 40 mg by mouth once daily  pancrelipase (CREON) 36,000-114,000-180,000 unit DR capsule Take 2 capsules by mouth 3 (three) times daily with meals And 1 capsule with  snacks  pantoprazole (PROTONIX) 40 MG DR tablet Take 1 tablet by mouth 2 (two) times daily  pramoxine-hydrocortisone 1-1 % rectal cream Place rectally as needed for Hemorrhoids  ranibizumab (LUCENTIS IZ) by Intravitreal route every 3 (three) months  tamsulosin (FLOMAX) 0.4 mg capsule Take 1 capsule (0.4 mg total) by mouth once daily Take 30 minutes after same meal each day. 30 capsule  temazepam (RESTORIL) 22.5 MG capsule TAKE 1 CAPSULE (22.5 MG TOTAL) BY MOUTH AT BEDTIME AS NEEDED FOR SLEEP.  traZODone (DESYREL) 50 MG tablet Take 50 mg by mouth at bedtime   No current facility-administered medications for this visit.   ALLERGIES: Kenalog [triamcinolone acetonide]  PAST MEDICAL HISTORY: Past Medical History:  Diagnosis Date  GERD (gastroesophageal reflux disease)  History of cancer  Lung nodule 11/23/2022   PAST SURGICAL HISTORY: Past Surgical History:  Procedure Laterality Date  CATARACT EXTRACTION 2017  UPPER GASTROINTESTINAL ENDOSCOPY N/A 09/17/2020  Procedure: UPPER EUS;; Surgeon: Jeanice Lim, MD; Location: [facility]; Service: Gastroenterology; Laterality: N/A;  Port placed 10/12/2020  UNLISTED PROCEDURE PANCREAS N/A 02/25/2021  Procedure: ROBOT ASSISTED LAPAROSCOPY, SURGICAL; PANCREATECTOMY DISTAL; Surgeon: Rhina Brackett, MD; Location: [facility]; Service: General Surgery; Laterality: N/A;  LAPAROSCOPIC SPLENECTOMY N/A 02/25/2021  Procedure: ROBOT ASSISTED LAPAROSCOPY, SURGICAL; SPLENECTOMY; Surgeon: Rhina Brackett, MD; Location: [facility]; Service: General Surgery; Laterality: N/A;  COLONOSCOPY  EGD  FAMILY HISTORY: Family History  Problem Relation Name Age of Onset  Anesthesia problems Neg Hx  Malignant hyperthermia Neg Hx    SOCIAL HISTORY: Social History   Socioeconomic History  Marital status: Married  Tobacco Use  Smoking status: Never  Smokeless tobacco: Never  Vaping Use  Vaping status: Never Used  Substance  and Sexual Activity  Alcohol use: Not Currently  Drug use: Never   Social Determinants of Health   Financial Resource Strain: Low Risk (04/13/2020)  Received from [facility]  Overall Financial Resource Strain (CARDIA)  Difficulty of Paying Living Expenses: Not hard at all  Food Insecurity: No Food Insecurity (01/13/2023)  Received from [facility]  Hunger Vital Sign  Worried About Running Out of Food in the Last Year: Never true  Ran Out of Food in the Last Year: Never true  Transportation Needs: No Transportation Needs (01/13/2023)  Received from [facility]  Lack of Transportation (Medical): No  Lack of Transportation (Non-Medical): No   PHYSICAL EXAM: Vitals:  02/09/23 0834  BP: 112/73  Pulse: 75   Body mass index is 22.5 kg/m. Weight: 81.6 kg (180 lb)   GENERAL: Alert, active, oriented x3  HEENT: Pupils equal reactive to light. Extraocular movements are intact. Sclera clear. Palpebral conjunctiva normal red color.Pharynx clear.  NECK: Supple with no palpable mass and no adenopathy.  LUNGS: Sound clear with no rales rhonchi or wheezes.  HEART: Regular rhythm S1 and S2 without murmur.  ABDOMEN: Soft and depressible, nontender with no palpable mass, no hepatomegaly.   EXTREMITIES: Well-developed well-nourished symmetrical with no dependent edema.  NEUROLOGICAL: Awake alert oriented, facial expression symmetrical, moving all extremities.  REVIEW OF DATA: I have reviewed the following data today: No visits with results within 3 Month(s) from this visit.  Latest known visit with results is:  Office Visit on 10/24/2022  Component Date Value  Vent Rate (bpm) 10/24/2022 68  PR Interval (msec) 10/24/2022 196  QRS Interval (msec) 10/24/2022 94  QT Interval (msec) 10/24/2022 398  QTc (msec) 10/24/2022 423    ASSESSMENT: Mr. Loduca is a 89 y.o. male presenting for consultation for cholelithiasis with history of choledocholithiasis.    Patient was oriented about the diagnosis of cholelithiasis. Also oriented about what is the gallbladder, its anatomy and function and the implications of having stones. The patient was oriented about the treatment alternatives (observation vs cholecystectomy). Patient was oriented that a low percentage of patient will continue to have similar pain symptoms even after the gallbladder is removed. Surgical technique (open vs laparoscopic) was discussed. It was also discussed the goals of the surgery (decrease the pain episodes and avoid the risk of cholecystitis) and the risk of surgery including: bleeding, infection, common bile duct injury, stone retention, injury to other organs such as bowel, liver, stomach, other complications such as hernia, bowel obstruction among others. Also discussed with patient about anesthesia and its complications such as: reaction to medications, pneumonia, heart complications, death, among others.   I personally evaluated the new MRCP. MRCP with normal common bile duct without filling defect. Normal bilirubin and liver enzymes. I had a long discussion with the patient about the recommendation of cholecystectomy to prevent recurrent episode.  Currently the patient is more concerned about his pulmonary nodules. I think that this is reasonable. Patient with history of pancreatic cancer. There is concerned about primary versus metastatic lung cancer.  He also endorses that he has a trip planned in 3 weeks that he does  not want to miss.  I discussed with patient and his wife to contact us whenever he feels ready to proceed with cholecystectomy.  Choledocholithiasis with chronic cholecystitis [K80.44]  PLAN: 1. Robotic assisted laparoscopic cholecystectomy  Patient and his wife verbalized understanding, all questions were answered, and were agreeable with the plan outlined above.   Carolan Shiver, MD

## 2023-02-23 ENCOUNTER — Other Ambulatory Visit: Payer: Self-pay

## 2023-02-23 ENCOUNTER — Encounter
Admission: RE | Admit: 2023-02-23 | Discharge: 2023-02-23 | Disposition: A | Discharge status: Home / Self Care | Payer: PPO | Source: Ambulatory Visit | Attending: General Surgery | Admitting: General Surgery

## 2023-02-23 VITALS — Ht 75.0 in | Wt 180.0 lb

## 2023-02-23 DIAGNOSIS — K8689 Other specified diseases of pancreas: Secondary | ICD-10-CM

## 2023-02-23 DIAGNOSIS — E139 Other specified diabetes mellitus without complications: Secondary | ICD-10-CM

## 2023-02-23 HISTORY — DX: Calculus of bile duct without cholangitis or cholecystitis without obstruction: K80.50

## 2023-02-23 HISTORY — DX: Aneurysm of renal artery: I72.2

## 2023-02-23 NOTE — Patient Instructions (Addendum)
Your procedure is scheduled on: 03/08/23 - Wednesday Report to the Registration Desk on the 1st floor of the [facility]. To find out your arrival time, please call [phone] between 1PM - 3PM on: 03/07/23 - Tuesday If your arrival time is 6:00 am, do not arrive before that time as the [facility] entrance doors do not open until 6:00 am. FREE Valet parking available  REMEMBER: Instructions that are not followed completely may result in serious medical risk, up to and including death; or upon the discretion of your surgeon and anesthesiologist your surgery may need to be rescheduled.  Do not eat food or drink any liquids after midnight the night before surgery.  No gum chewing or hard candies.  One week prior to surgery: Stop taking beginning 03/01/23, Anti-inflammatories (NSAIDS) such as Advil, Aleve, Ibuprofen, Motrin, Naproxen, Naprosyn and Aspirin based products such as Excedrin, Goody's Powder, BC Powder. You may however, continue to take Tylenol if needed for pain up until the day of surgery.  Stop taking beginning 03/01/23, ANY OVER THE COUNTER supplements until after surgery.  - CALCIUM 600 + D PO  - VITAMIN D3  - Cranberry  - JOINT HEALTH PO - B12  TAKE ONLY THESE MEDICATIONS THE MORNING OF SURGERY WITH A SIP OF WATER:  omeprazole (PRILOSEC) - (take one the night before and one on the morning of surgery - helps to prevent nausea after surgery.) metoprolol succinate (TOPROL-XL)   No Alcohol for 24 hours before or after surgery.  No Smoking including e-cigarettes for 24 hours before surgery.  No chewable tobacco products for at least 6 hours before surgery.  No nicotine patches on the day of surgery.  Do not use any "recreational" drugs for at least a week (preferably 2 weeks) before your surgery.  Please be advised that the combination of cocaine and anesthesia may have negative outcomes, up to and including death. If you test positive for cocaine, your surgery will  be cancelled.  On the morning of surgery brush your teeth with toothpaste and water, you may rinse your mouth with mouthwash if you wish. Do not swallow any toothpaste or mouthwash.  Use CHG Soap or wipes as directed on instruction sheet.  Do not wear jewelry, make-up, hairpins, clips or nail polish.  Do not wear lotions, powders, or perfumes.   Do not shave body hair from the neck down 48 hours before surgery.  Contact lenses, hearing aids and dentures may not be worn into surgery.  Do not bring valuables to the hospital. [facility] is not responsible for any missing/lost belongings or valuables.   Notify your doctor if there is any change in your medical condition (cold, fever, infection).  Wear comfortable clothing (specific to your surgery type) to the hospital.  After surgery, you can help prevent lung complications by doing breathing exercises.  Take deep breaths and cough every 1-2 hours. Your doctor may order a device called an Incentive Spirometer to help you take deep breaths. When coughing or sneezing, hold a pillow firmly against your incision with both hands. This is called "splinting." Doing this helps protect your incision. It also decreases belly discomfort.  If you are being admitted to the hospital overnight, leave your suitcase in the car. After surgery it may be brought to your room.  In case of increased patient census, it may be necessary for you, the patient, to continue your postoperative care in the Same Day Surgery department.  If you are being discharged the  day of surgery, you will not be allowed to drive home. You will need a responsible individual to drive you home and stay with you for 24 hours after surgery.   If you are taking public transportation, you will need to have a responsible individual with you.  Please call the Pre-admissions Testing Dept. at [phone] if you have any questions about these instructions.  Surgery Visitation  Policy:  Patients having surgery or a procedure may have two visitors.  Children under the age of 50 must have an adult with them who is not the patient.  Inpatient Visitation:    Visiting hours are 7 a.m. to 8 p.m. Up to four visitors are allowed at one time in a patient room. The visitors may rotate out with other people during the day.  One visitor age 63 or older may stay with the patient overnight and must be in the room by 8 p.m.    Preparing for Surgery with CHLORHEXIDINE GLUCONATE (CHG) Soap  Chlorhexidine Gluconate (CHG) Soap  o An antiseptic cleaner that kills germs and bonds with the skin to continue killing germs even after washing  o Used for showering the night before surgery and morning of surgery  Before surgery, you can play an important role by reducing the number of germs on your skin.  CHG (Chlorhexidine gluconate) soap is an antiseptic cleanser which kills germs and bonds with the skin to continue killing germs even after washing.  Please do not use if you have an allergy to CHG or antibacterial soaps. If your skin becomes reddened/irritated stop using the CHG.  1. Shower the NIGHT BEFORE SURGERY and the MORNING OF SURGERY with CHG soap.  2. If you choose to wash your hair, wash your hair first as usual with your normal shampoo.  3. After shampooing, rinse your hair and body thoroughly to remove the shampoo.  4. Use CHG as you would any other liquid soap. You can apply CHG directly to the skin and wash gently with a scrungie or a clean washcloth.  5. Apply the CHG soap to your body only from the neck down. Do not use on open wounds or open sores. Avoid contact with your eyes, ears, mouth, and genitals (private parts). Wash face and genitals (private parts) with your normal soap.  6. Wash thoroughly, paying special attention to the area where your surgery will be performed.  7. Thoroughly rinse your body with warm water.  8. Do not shower/wash with your  normal soap after using and rinsing off the CHG soap.  9. Pat yourself dry with a clean towel.  10. Wear clean pajamas to bed the night before surgery.  12. Place clean sheets on your bed the night of your first shower and do not sleep with pets.  13. Shower again with the CHG soap on the day of surgery prior to arriving at the hospital.  14. Do not apply any deodorants/lotions/powders.  15. Please wear clean clothes to the hospital.

## 2023-03-01 ENCOUNTER — Telehealth: Payer: Self-pay | Admitting: Oncology

## 2023-03-01 HISTORY — PX: CHOLECYSTECTOMY: SHX55

## 2023-03-01 NOTE — Telephone Encounter (Signed)
Port flushes Q3 months

## 2023-03-01 NOTE — Telephone Encounter (Signed)
Pt left a voicemail and wants to know when he is supposed to come for a port flush. Please call him back to schedule.

## 2023-03-07 ENCOUNTER — Other Ambulatory Visit: Payer: PPO

## 2023-03-07 ENCOUNTER — Encounter: Payer: Self-pay | Admitting: General Surgery

## 2023-03-07 DIAGNOSIS — H26492 Other secondary cataract, left eye: Secondary | ICD-10-CM | POA: Diagnosis not present

## 2023-03-07 DIAGNOSIS — H353223 Exudative age-related macular degeneration, left eye, with inactive scar: Secondary | ICD-10-CM | POA: Diagnosis not present

## 2023-03-07 DIAGNOSIS — H31093 Other chorioretinal scars, bilateral: Secondary | ICD-10-CM | POA: Diagnosis not present

## 2023-03-07 DIAGNOSIS — H43813 Vitreous degeneration, bilateral: Secondary | ICD-10-CM | POA: Diagnosis not present

## 2023-03-07 DIAGNOSIS — H353211 Exudative age-related macular degeneration, right eye, with active choroidal neovascularization: Secondary | ICD-10-CM | POA: Diagnosis not present

## 2023-03-07 NOTE — Progress Notes (Signed)
Perioperative / Anesthesia Services  Pre-Admission Testing Clinical Review / Pre-Operative Anesthesia Consult  Date: 03/07/23  Patient Demographics:  Name: George Owens DOB:   [birth year] MRN:   [id number]  Planned Surgical Procedure(s):    Case: [id number] Date/Time: 03/08/23 0837   Procedure: XI ROBOTIC ASSISTED LAPAROSCOPIC CHOLECYSTECTOMY (Abdomen)   Anesthesia type: General   Pre-op diagnosis: choledocholithiasis w chronic cholecystitis   Location: [facility] OR ROOM 07 / [facility]S FOR ANEST[facility]ESIA [facility]   Surgeons: Carolan Shiver, MD     NOTE: Available PAT nursing documentation and vital signs have been reviewed. Clinical nursing staff has updated patient's PMH/PSHx, current medication list, and drug allergies/intolerances to ensure comprehensive history available to assist in medical decision making as it pertains to the aforementioned surgical procedure and anticipated anesthetic course. Extensive review of available clinical information personally performed. George Owens PMH and PSHx updated with any diagnoses/procedures that  may have been inadvertently omitted during his intake with the pre-admission testing department's nursing staff.  Clinical Discussion:  George Owens is a 89 y.o. male who is submitted for pre-surgical anesthesia review and clearance prior to him undergoing the above procedure. Patient has never been a smoker. Pertinent PMH includes: CAD, diastolic dysfunction, PAF, TAA, renal artery aneurysm, aortic atherosclerosis, HTN, HLD, secondary diabetes, GERD (on daily PPI), pancreatic cancer (s/p distal pancreatectomy and splenectomy), hepatic cirrhosis, cholelithiasis/choledocholithiasis, BPH, neuropathy.  Patient is followed by cardiology Darrold Junker, MD). He was last seen in the cardiology clinic on 10/24/2022; notes reviewed. At the time of his clinic visit, patient doing well overall from a cardiovascular perspective.  Patient with complaints of infrequent  episodes of shortness of breath and tachycardia.  Patient denied any chest pain, PND, orthopnea, palpitations, significant peripheral edema, weakness, fatigue, vertiginous symptoms, or presyncope/syncope. Patient with a past medical history significant for cardiovascular diagnoses. Documented physical exam was grossly benign, providing no evidence of acute exacerbation and/or decompensation of the patient's known cardiovascular conditions.  TTE performed on 10/09/2020 revealed a normal left ventricular systolic function with an EF of 60 to 65%.  There were no regional wall motion abnormalities. Left ventricular diastolic Doppler parameters consistent with abnormal relaxation (G1DD).  Right ventricular size and function normal.  There was no significant valvular regurgitation.  All transvalvular gradients were noted to be normal providing no evidence suggestive of valvular stenosis.  There was mild dilatation of the aortic root measuring 39 mm.  Patient with a diagnosis of pancreatic cancer for which he ultimately underwent distal pancreatectomy and splenectomy.  Postoperatively, patient developed paroxysmal atrial fibrillation; CHA2DS2-VASc Score = 4 (age x 2, HTN, vascular disease history). His rate and rhythm are currently being maintained on oral metoprolol succinate.  Given the paroxysmal nature of his arrhythmia, patient is not currently on chronic oral anticoagulation therapy.  Blood pressure well controlled at 120/78 mmHg on currently prescribed beta-blocker (metoprolol succinate) monotherapy.  Patient is not currently taking any type of lipid-lowering therapies for his HLD diagnosis or ASCVD prevention.  Patient has a diagnosis of secondary diabetes; HgbA1c was 6.7% when checked on 10/14/2022.  Of note, since patient was last seen by cardiology, A1c level has been rechecked with interval increase to 7.0% on 01/19/2023.  Patient does not have an OSAH diagnosis.  Patient is active per baseline.  He is able  to complete all of his ADLs/IADLs independently without significant cardiovascular limitation.  Per the [facility], patient is able to achieve greater than 4 METS of physical activity without experiencing any difficult degrees of angina/anginal  equivalent symptoms.  No changes were made to his medication regimen.  Patient to follow-up with outpatient cardiology in 6 months or sooner if needed.  George Owens is scheduled for an XI ROBOTIC ASSISTED LAPAROSCOPIC CHOLECYSTECTOMY (Abdomen) on 03/08/2023 with Dr. Carolan Shiver, MD.  Given patient's past medical history significant for cardiovascular diagnoses, presurgical cardiac clearance was sought by the PAT team. Per cardiology, "this patient is optimized for surgery and may proceed with the planned procedural course with a LOW risk of significant perioperative cardiovascular complications".    In review of his medication reconciliation, the patient is not noted to be taking any type of anticoagulation or antiplatelet therapies that would need to be held during his perioperative course.  Patient denies previous perioperative complications with anesthesia in the past. In review of the available records, it is noted that patient underwent a general anesthetic course at [facility] in 10/2022 without documented complications.      02/23/2023    9:36 AM 01/31/2023    1:56 PM 01/12/2023    7:26 AM  Vitals with BMI  Height 6\' 3"     Weight 180 lbs 180 lbs   BMI 22.5 22.5   Systolic  121 114  Diastolic  77 65  Pulse  63 67    Providers/Specialists:   NOTE: Primary physician provider listed below. Patient may have been seen by APP or partner within same practice.   PROVIDER ROLE / SPECIALTY LAST Beverely Pace, MD General Surgery (Surgeon) 02/09/2023  [name], MD Primary Care Provider 01/16/2023  Marcina Millard, MD Cardiology 10/24/2022  Owens Shark, MD Medical Oncology 10/14/2022   Allergies:   Kenalog [triamcinolone acetonide]  Current Home Medications:   No current facility-administered medications for this encounter.    acetaminophen (TYLENOL) 500 MG tablet   Blood Glucose Monitoring Suppl (ONETOUCH VERIO) w/Device KIT   Blood Glucose Monitoring Suppl DEVI   Calcium Carb-Cholecalciferol (CALCIUM 600 + D PO)   cholecalciferol (VITAMIN D3) 25 MCG (1000 UNIT) tablet   Cranberry 500 MG CAPS   denosumab (PROLIA) 60 MG/ML SOSY injection   Glucose Blood (BLOOD GLUCOSE TEST STRIPS) STRP   glucose blood (ONETOUCH VERIO) test strip   Lancets (ONETOUCH ULTRASOFT) lancets   loratadine (CLARITIN) 10 MG tablet   metoprolol succinate (TOPROL-XL) 25 MG 24 hr tablet   Misc Natural Products (JOINT HEALTH PO)   naproxen sodium (ALEVE) 220 MG tablet   omeprazole (PRILOSEC) 40 MG capsule   Polyethyl Glycol-Propyl Glycol (SYSTANE OP)   tamsulosin (FLOMAX) 0.4 MG CAPS capsule   vitamin B-12 (CYANOCOBALAMIN) 1000 MCG tablet    heparin lock flush 100 UNIT/ML injection   heparin lock flush 100 unit/mL   History:   Past Medical History:  Diagnosis Date   Adenocarcinoma of pancreas (HCC)    a.) stage 1B poorly differentiated adenocarcinoma of the pancreas (grade 3) s/p distal pancreatectomy and splenectomy (negative margins); 15 lymph nodes negative for malignancy   Allergy    Aortic atherosclerosis (HCC)    Basal cell carcinoma    right temple 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   Colon polyps    Coronary artery calcification seen on CT scan    COVID-19 virus infection 03/16/2020   Diastolic dysfunction 10/09/2020   a.) TTE 10/09/2020: EF 60-65%, no RWMAs, normal RVSF, Ao root 39 mm, G1DD   GERD (  gastroesophageal reflux disease)    Hemorrhoids    Hepatic steatosis    Hyperlipidemia    Hypertension    Leg cramps    Neuropathy    Osteoporosis    a.) on denosumab injections    Paroxysmal A-fib (HCC) 02/25/2021   a.) developed postop following distal pancreatectomy and splenectomy; b.) CHA2DS2-VASc = 4 (age x2, HTN, vascular disease history); b.) cardiac rate/rhythm maintained on oral metoprolol succinate; no chronic OAC   Prostate cancer (HCC) 2006   Renal artery aneurysm (HCC)    Secondary diabetes (HCC)    Thoracic ascending aortic aneurysm (HCC) 04/29/2020   a.) CT chest 04/29/2020: 4.7 cm; b.) CT CAP 08/04/2021: 4.6 cm; c.) CT chest 10/08/2021: 4.5 cm; d.) CT chest 02/25/2022: 4.6 cm; e.) CT chest 10/07/2022: 4.5 cm   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   Past Surgical History:  Procedure Laterality Date   BRONCHIAL BRUSHINGS  11/01/2022   Procedure: BRONCHIAL BRUSHINGS;  Surgeon: Raechel Chute, MD;  Location: [facility] ENDOSCOPY;  Service: Pulmonary;;   BRONCHIAL NEEDLE ASPIRATION BIOPSY  11/01/2022   Procedure: BRONCHIAL NEEDLE ASPIRATION BIOPSIES;  Surgeon: Raechel Chute, MD;  Location: [facility] ENDOSCOPY;  Service: Pulmonary;;   BRONCHIAL WASHINGS  11/01/2022   Procedure: BRONCHIAL WASHINGS;  Surgeon: Raechel Chute, MD;  Location: [facility] ENDOSCOPY;  Service: Pulmonary;;   CATARACT EXTRACTION Right    CATARACT EXTRACTION W/PHACO Left 12/26/2017   Procedure: CATARACT EXTRACTION PHACO AND INTRAOCULAR LENS PLACEMENT (IOC)  COMPLICATED LEFT;  Surgeon: Lockie Mola, MD;  Location: [facility];  Service: Ophthalmology;  Laterality: Left;  MALYUGIN VISION BLUE   colonoscopy     ESOPHAGOGASTRODUODENOSCOPY (EGD) WITH PROPOFOL     EYE MUSCLE SURGERY Left    IR KYPHO THORACIC WITH BONE BIOPSY  11/11/2021   LAPAROSCOPIC SPLENECTOMY  02/25/2021   PORTA CATH INSERTION N/A 10/19/2020   Procedure: PORTA CATH INSERTION;  Surgeon: Annice Needy, MD;  Location: [facility] INVASIVE CV LAB;  Service: Cardiovascular;  Laterality: N/A;   TONSILLECTOMY AND ADENOIDECTOMY     XI ROBOTIC ASSISTED LAPAROSCOPIC DISTAL PANCREATECTOMY  02/25/2021   Family History  Problem Relation Age of  Onset   Stroke Mother    Arthritis Father    Heart disease Father    Brain cancer Brother        1/2 brother (possibly related to mother NOT related to patient)   Cancer Cousin        unk type   Lymphoma Cousin        vs leukemia   Colon cancer Neg Hx    Esophageal cancer Neg Hx    Pancreatic cancer Neg Hx    Stomach cancer Neg Hx    Liver disease Neg Hx    Social History   Tobacco Use   Smoking status: Never   Smokeless tobacco: Never  Vaping Use   Vaping status: Never Used  Substance Use Topics   Alcohol use: No   Drug use: No    Pertinent Clinical Results:  LABS:   Lab Results  Component Value Date   WBC 7.3 01/19/2023   HGB 13.5 01/19/2023   HCT 42.0 01/19/2023   MCV 95.5 01/19/2023   PLT 363.0 01/19/2023   Lab Results  Component Value Date   NA 137 01/19/2023   K 4.2 01/19/2023   CO2 33 (H) 01/19/2023   GLUCOSE 125 (H) 01/19/2023   BUN 24 (H) 01/19/2023   CREATININE 0.95 01/19/2023   CALCIUM 9.9 01/19/2023   GFR 71.37 01/19/2023  GFRNONAA >60 01/12/2023    ECG: Date: 01/10/2023 Time ECG obtained: 0839 AM Rate: 71 bpm Rhythm:  Sinus rhythm with PVCs Axis (leads I and aVF): Normal Intervals: PR 196 ms. QRS 91 ms. QTc 442 ms. ST segment and T wave changes: No evidence of acute ST segment elevation or depression. Evidence of a possible age undetermined anterior infarct present. Comparison: Tracing obtained on 11/01/2022 showed normal sinus rhythm at a rate of 67 bpm with inferior T wave abnormalities.   IMAGING / PROCEDURES: NM PET IMAGE RESTAG (PS) SKULL BASE TO THIGH performed on 02/16/2023 Increase in size and metabolic activity of bilateral pulmonary nodules consistent with progression of pulmonary metastasis. No evidence of local recurrence in the pancreas. No evidence of metastatic adenopathy in the abdomen pelvis. Intense metabolic activity associated with low-density lesion in the isthmus of the thyroid gland not changed from comparison  exam.  CT ABDOMEN PELVIS W CONTRAST performed on 01/10/2023 Again stable surgical changes of distal pancreatectomy and splenectomy. Distended gallbladder with stones and possible wall thickening. If there is further concern of acute gallbladder pathology, an ultrasound may be useful as the next step in the workup. 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  TRANSTHORACIC ECHOCARDIOGRAM performed on 10/09/2020 Left ventricular ejection fraction, by estimation, is 60 to 65%. The left ventricle has normal function. The left ventricle has no regional wall motion abnormalities. Left ventricular diastolic parameters are consistent with Grade I diastolic dysfunction (impaired relaxation).  Right ventricular systolic function is normal. The right ventricular size is normal.  The mitral valve is normal in structure. No evidence of mitral valve regurgitation.  The aortic valve was not well visualized. Unable to determine number of leaflets. Aortic valve regurgitation is not visualized. No aortic stenosis is present.  Aortic ascending aorta not well visualized.There is mild dilatation of the aortic root, measuring 39 mm.   Impression and Plan:  George Owens has been referred for pre-anesthesia review and clearance prior to him undergoing the planned anesthetic and procedural courses. Available labs, pertinent testing, and imaging results were personally reviewed by me in preparation for upcoming operative/procedural course. [facility] medical record has been updated following extensive record review and patient interview with PAT staff.   This patient has been appropriately cleared by cardiology with an overall LOW risk of experiencing significant perioperative cardiovascular complications. Based on clinical review performed today (03/07/23), barring any significant acute changes in the patient's overall condition, it is anticipated that he will be able to proceed with the planned  surgical intervention. Any acute changes in clinical condition may necessitate his procedure being postponed and/or cancelled. Patient will meet with anesthesia team (MD and/or CRNA) on the day of his procedure for preoperative evaluation/assessment. Questions regarding anesthetic course will be fielded at that time.   Pre-surgical instructions were reviewed with the patient during his PAT appointment, and questions were fielded to satisfaction by PAT clinical staff. He has been instructed on which medications that he will need to hold prior to surgery, as well as the ones that have been deemed safe/appropriate to take on the day of his procedure. As part of the general education provided by PAT, patient made aware both verbally and in writing, that he would need to abstain from the use of any illegal substances during his perioperative course.  He was advised that failure to follow the provided instructions could necessitate case cancellation or result in serious perioperative complications up to and including death. Patient encouraged to contact  PAT and/or his surgeon's office to discuss any questions or concerns that may arise prior to surgery; verbalized understanding.   Quentin Mulling, MSN, APRN, FNP-C, CEN [facility]  Perioperative Services Nurse Practitioner Phone: [phone] Fax: [phone] 03/07/23 3:11 PM  NOTE: This note has been prepared using Dragon dictation software. Despite my best ability to proofread, there is always the potential that unintentional transcriptional errors may still occur from this process.

## 2023-03-08 ENCOUNTER — Encounter
Admission: RE | Disposition: A | Discharge status: Home / Self Care | Payer: Self-pay | Source: Home / Self Care | Attending: General Surgery

## 2023-03-08 ENCOUNTER — Other Ambulatory Visit: Payer: Self-pay

## 2023-03-08 ENCOUNTER — Ambulatory Visit: Payer: PPO | Admitting: Urgent Care

## 2023-03-08 ENCOUNTER — Ambulatory Visit
Admission: RE | Admit: 2023-03-08 | Discharge: 2023-03-08 | Disposition: A | Discharge status: Home / Self Care | Payer: PPO | Attending: General Surgery | Admitting: General Surgery

## 2023-03-08 ENCOUNTER — Encounter: Payer: Self-pay | Admitting: General Surgery

## 2023-03-08 DIAGNOSIS — E785 Hyperlipidemia, unspecified: Secondary | ICD-10-CM | POA: Insufficient documentation

## 2023-03-08 DIAGNOSIS — K828 Other specified diseases of gallbladder: Secondary | ICD-10-CM | POA: Diagnosis not present

## 2023-03-08 DIAGNOSIS — Z90410 Acquired total absence of pancreas: Secondary | ICD-10-CM | POA: Diagnosis not present

## 2023-03-08 DIAGNOSIS — I7121 Aneurysm of the ascending aorta, without rupture: Secondary | ICD-10-CM | POA: Insufficient documentation

## 2023-03-08 DIAGNOSIS — I480 Paroxysmal atrial fibrillation: Secondary | ICD-10-CM | POA: Diagnosis not present

## 2023-03-08 DIAGNOSIS — E119 Type 2 diabetes mellitus without complications: Secondary | ICD-10-CM | POA: Insufficient documentation

## 2023-03-08 DIAGNOSIS — K8042 Calculus of bile duct with acute cholecystitis without obstruction: Secondary | ICD-10-CM | POA: Diagnosis not present

## 2023-03-08 DIAGNOSIS — I10 Essential (primary) hypertension: Secondary | ICD-10-CM | POA: Diagnosis not present

## 2023-03-08 DIAGNOSIS — N400 Enlarged prostate without lower urinary tract symptoms: Secondary | ICD-10-CM | POA: Diagnosis not present

## 2023-03-08 DIAGNOSIS — K219 Gastro-esophageal reflux disease without esophagitis: Secondary | ICD-10-CM | POA: Diagnosis not present

## 2023-03-08 DIAGNOSIS — M810 Age-related osteoporosis without current pathological fracture: Secondary | ICD-10-CM | POA: Diagnosis not present

## 2023-03-08 DIAGNOSIS — G629 Polyneuropathy, unspecified: Secondary | ICD-10-CM | POA: Insufficient documentation

## 2023-03-08 DIAGNOSIS — Z8507 Personal history of malignant neoplasm of pancreas: Secondary | ICD-10-CM | POA: Diagnosis not present

## 2023-03-08 DIAGNOSIS — Z9081 Acquired absence of spleen: Secondary | ICD-10-CM | POA: Insufficient documentation

## 2023-03-08 DIAGNOSIS — I2510 Atherosclerotic heart disease of native coronary artery without angina pectoris: Secondary | ICD-10-CM | POA: Diagnosis not present

## 2023-03-08 DIAGNOSIS — K8064 Calculus of gallbladder and bile duct with chronic cholecystitis without obstruction: Secondary | ICD-10-CM | POA: Diagnosis not present

## 2023-03-08 DIAGNOSIS — K8010 Calculus of gallbladder with chronic cholecystitis without obstruction: Secondary | ICD-10-CM | POA: Diagnosis not present

## 2023-03-08 DIAGNOSIS — Z8546 Personal history of malignant neoplasm of prostate: Secondary | ICD-10-CM | POA: Insufficient documentation

## 2023-03-08 DIAGNOSIS — K8689 Other specified diseases of pancreas: Secondary | ICD-10-CM

## 2023-03-08 DIAGNOSIS — E139 Other specified diabetes mellitus without complications: Secondary | ICD-10-CM

## 2023-03-08 HISTORY — DX: Unspecified hemorrhoids: K64.9

## 2023-03-08 HISTORY — DX: Malignant neoplasm of pancreas, unspecified: C25.9

## 2023-03-08 HISTORY — DX: Benign prostatic hyperplasia without lower urinary tract symptoms: N40.0

## 2023-03-08 HISTORY — DX: Other specified diabetes mellitus without complications: E13.9

## 2023-03-08 HISTORY — DX: Atherosclerotic heart disease of native coronary artery without angina pectoris: I25.10

## 2023-03-08 HISTORY — DX: Fatty (change of) liver, not elsewhere classified: K76.0

## 2023-03-08 HISTORY — DX: Atherosclerosis of aorta: I70.0

## 2023-03-08 SURGERY — CHOLECYSTECTOMY, ROBOT-ASSISTED, LAPAROSCOPIC
Anesthesia: General | Site: Abdomen

## 2023-03-08 MED ORDER — VISTASEAL 10 ML SINGLE DOSE KIT
PACK | CUTANEOUS | Status: DC | PRN
Start: 2023-03-08 — End: 2023-03-08
  Administered 2023-03-08: 10 mL via TOPICAL

## 2023-03-08 MED ORDER — ROCURONIUM BROMIDE 100 MG/10ML IV SOLN
INTRAVENOUS | Status: DC | PRN
  Administered 2023-03-08: 10 mg via INTRAVENOUS
  Administered 2023-03-08: 50 mg via INTRAVENOUS

## 2023-03-08 MED ORDER — CHLORHEXIDINE GLUCONATE 0.12 % MT SOLN
OROMUCOSAL | Status: AC
  Filled 2023-03-08: qty 15

## 2023-03-08 MED ORDER — MIDAZOLAM HCL 2 MG/2ML IJ SOLN
INTRAMUSCULAR | Status: AC
  Filled 2023-03-08: qty 2

## 2023-03-08 MED ORDER — FENTANYL CITRATE (PF) 100 MCG/2ML IJ SOLN
INTRAMUSCULAR | Status: DC | PRN
  Administered 2023-03-08 (×4): 25 ug via INTRAVENOUS

## 2023-03-08 MED ORDER — ONDANSETRON HCL 4 MG/2ML IJ SOLN
INTRAMUSCULAR | Status: DC | PRN
  Administered 2023-03-08: 4 mg via INTRAVENOUS

## 2023-03-08 MED ORDER — EPINEPHRINE PF 1 MG/ML IJ SOLN
INTRAMUSCULAR | Status: AC
  Filled 2023-03-08: qty 1

## 2023-03-08 MED ORDER — FENTANYL CITRATE (PF) 100 MCG/2ML IJ SOLN
INTRAMUSCULAR | Status: AC
  Filled 2023-03-08: qty 2

## 2023-03-08 MED ORDER — OXYCODONE HCL 5 MG/5ML PO SOLN: 5.0000 mg | Freq: Once | ORAL | Status: DC | PRN

## 2023-03-08 MED ORDER — CHLORHEXIDINE GLUCONATE 0.12 % MT SOLN
15.0000 mL | Freq: Once | OROMUCOSAL | Status: AC
  Administered 2023-03-08: 15 mL via OROMUCOSAL

## 2023-03-08 MED ORDER — BUPIVACAINE-EPINEPHRINE 0.25% -1:200000 IJ SOLN
INTRAMUSCULAR | Status: DC | PRN
  Administered 2023-03-08: 30 mL

## 2023-03-08 MED ORDER — BUPIVACAINE HCL (PF) 0.25 % IJ SOLN
INTRAMUSCULAR | Status: AC
  Filled 2023-03-08: qty 30

## 2023-03-08 MED ORDER — CEFAZOLIN SODIUM-DEXTROSE 2-4 GM/100ML-% IV SOLN
2.0000 g | INTRAVENOUS | Status: AC
  Administered 2023-03-08: 2 g via INTRAVENOUS

## 2023-03-08 MED ORDER — FENTANYL CITRATE (PF) 100 MCG/2ML IJ SOLN
25.0000 ug | INTRAMUSCULAR | Status: DC | PRN
  Administered 2023-03-08 (×2): 25 ug via INTRAVENOUS

## 2023-03-08 MED ORDER — EPHEDRINE SULFATE (PRESSORS) 50 MG/ML IJ SOLN
INTRAMUSCULAR | Status: DC | PRN
  Administered 2023-03-08 (×3): 10 mg via INTRAVENOUS

## 2023-03-08 MED ORDER — INDOCYANINE GREEN 25 MG IV SOLR
INTRAVENOUS | Status: AC
  Filled 2023-03-08: qty 10

## 2023-03-08 MED ORDER — PROPOFOL 10 MG/ML IV BOLUS
INTRAVENOUS | Status: DC | PRN
Start: 2023-03-08 — End: 2023-03-08
  Administered 2023-03-08 (×2): 50 mg via INTRAVENOUS

## 2023-03-08 MED ORDER — DEXAMETHASONE SODIUM PHOSPHATE 10 MG/ML IJ SOLN
INTRAMUSCULAR | Status: DC | PRN
  Administered 2023-03-08: 5 mg via INTRAVENOUS

## 2023-03-08 MED ORDER — SUGAMMADEX SODIUM 200 MG/2ML IV SOLN
INTRAVENOUS | Status: DC | PRN
  Administered 2023-03-08: 200 mg via INTRAVENOUS

## 2023-03-08 MED ORDER — PHENYLEPHRINE HCL (PRESSORS) 10 MG/ML IV SOLN
INTRAVENOUS | Status: DC | PRN
  Administered 2023-03-08: 100 ug via INTRAVENOUS

## 2023-03-08 MED ORDER — ORAL CARE MOUTH RINSE: 15.0000 mL | Freq: Once | OROMUCOSAL | Status: AC

## 2023-03-08 MED ORDER — LACTATED RINGERS IV SOLN: INTRAVENOUS | Status: DC

## 2023-03-08 MED ORDER — 0.9 % SODIUM CHLORIDE (POUR BTL) OPTIME
TOPICAL | Status: DC | PRN
  Administered 2023-03-08: 500 mL

## 2023-03-08 MED ORDER — INDOCYANINE GREEN 25 MG IV SOLR
1.2500 mg | Freq: Once | INTRAVENOUS | Status: AC
  Administered 2023-03-08: 1.25 mg via INTRAVENOUS

## 2023-03-08 MED ORDER — GLYCOPYRROLATE 0.2 MG/ML IJ SOLN
INTRAMUSCULAR | Status: DC | PRN
  Administered 2023-03-08: .2 mg via INTRAVENOUS

## 2023-03-08 MED ORDER — TRAMADOL HCL 50 MG PO TABS
50.0000 mg | ORAL_TABLET | Freq: Four times a day (QID) | ORAL | 0 refills | Status: DC | PRN
Start: 2023-03-08 — End: 2023-05-12

## 2023-03-08 MED ORDER — LIDOCAINE HCL (CARDIAC) PF 100 MG/5ML IV SOSY
PREFILLED_SYRINGE | INTRAVENOUS | Status: DC | PRN
  Administered 2023-03-08: 60 mg via INTRAVENOUS

## 2023-03-08 MED ORDER — VISTASEAL 10 ML SINGLE DOSE KIT
PACK | CUTANEOUS | Status: AC
  Filled 2023-03-08: qty 10

## 2023-03-08 MED ORDER — OXYCODONE HCL 5 MG PO TABS: 5.0000 mg | ORAL_TABLET | Freq: Once | ORAL | Status: DC | PRN

## 2023-03-08 MED ORDER — CEFAZOLIN SODIUM-DEXTROSE 2-4 GM/100ML-% IV SOLN
INTRAVENOUS | Status: AC
  Filled 2023-03-08: qty 100

## 2023-03-08 MED ORDER — PHENYLEPHRINE 80 MCG/ML (10ML) SYRINGE FOR IV PUSH (FOR BLOOD PRESSURE SUPPORT)
PREFILLED_SYRINGE | INTRAVENOUS | Status: DC | PRN
  Administered 2023-03-08: 80 ug via INTRAVENOUS
  Administered 2023-03-08: 120 ug via INTRAVENOUS

## 2023-03-08 SURGICAL SUPPLY — 53 items
ADH SKN CLS APL DERMABOND .7 (GAUZE/BANDAGES/DRESSINGS) ×1
APL LAPSCP 35 DL APL RGD (MISCELLANEOUS) ×1
APPLICATOR VISTASEAL 35 (MISCELLANEOUS) IMPLANT
BAG PRESSURE INF REUSE 1000 (BAG) IMPLANT
CANNULA REDUCER 12-8 DVNC XI (CANNULA) ×1 IMPLANT
CATH REDDICK CHOLANGI 4FR 50CM (CATHETERS) IMPLANT
CAUTERY HOOK MNPLR 1.6 DVNC XI (INSTRUMENTS) ×1 IMPLANT
CLIP LIGATING HEM O LOK PURPLE (MISCELLANEOUS) IMPLANT
CLIP LIGATING HEMO O LOK GREEN (MISCELLANEOUS) ×1 IMPLANT
DERMABOND ADVANCED .7 DNX12 (GAUZE/BANDAGES/DRESSINGS) ×1 IMPLANT
DRAPE ARM DVNC X/XI (DISPOSABLE) ×4 IMPLANT
DRAPE C-ARM XRAY 36X54 (DRAPES) IMPLANT
DRAPE COLUMN DVNC XI (DISPOSABLE) ×1 IMPLANT
ELECT REM PT RETURN 9FT ADLT (ELECTROSURGICAL) ×1
ELECTRODE REM PT RTRN 9FT ADLT (ELECTROSURGICAL) ×1 IMPLANT
FORCEPS BPLR 8 MD DVNC XI (FORCEP) ×1 IMPLANT
FORCEPS BPLR R/ABLATION 8 DVNC (INSTRUMENTS) ×1 IMPLANT
FORCEPS PROGRASP DVNC XI (FORCEP) ×1 IMPLANT
GLOVE BIO SURGEON STRL SZ 6.5 (GLOVE) ×2 IMPLANT
GLOVE BIOGEL PI IND STRL 6.5 (GLOVE) ×2 IMPLANT
GOWN STRL REUS W/ TWL LRG LVL3 (GOWN DISPOSABLE) ×3 IMPLANT
GOWN STRL REUS W/TWL LRG LVL3 (GOWN DISPOSABLE) ×3
GRASPER SUT TROCAR 14GX15 (MISCELLANEOUS) ×1 IMPLANT
IRRIGATOR SUCT 8 DISP DVNC XI (IRRIGATION / IRRIGATOR) IMPLANT
IV CATH ANGIO 12GX3 LT BLUE (NEEDLE) IMPLANT
IV NS 1000ML (IV SOLUTION) ×1
IV NS 1000ML BAXH (IV SOLUTION) IMPLANT
KIT PINK PAD W/HEAD ARE REST (MISCELLANEOUS) ×1
KIT PINK PAD W/HEAD ARM REST (MISCELLANEOUS) ×1 IMPLANT
LABEL OR SOLS (LABEL) ×1 IMPLANT
MANIFOLD NEPTUNE II (INSTRUMENTS) ×1 IMPLANT
NDL HYPO 22X1.5 SAFETY MO (MISCELLANEOUS) ×1 IMPLANT
NDL INSUFFLATION 14GA 120MM (NEEDLE) ×1 IMPLANT
NEEDLE HYPO 22X1.5 SAFETY MO (MISCELLANEOUS) ×1 IMPLANT
NEEDLE INSUFFLATION 14GA 120MM (NEEDLE) ×1 IMPLANT
NS IRRIG 500ML POUR BTL (IV SOLUTION) ×1 IMPLANT
OBTURATOR OPTICAL STND 8 DVNC (TROCAR) ×1
OBTURATOR OPTICALSTD 8 DVNC (TROCAR) ×1 IMPLANT
PACK LAP CHOLECYSTECTOMY (MISCELLANEOUS) ×1 IMPLANT
SEAL UNIV 5-12 XI (MISCELLANEOUS) ×4 IMPLANT
SET TUBE SMOKE EVAC HIGH FLOW (TUBING) ×1 IMPLANT
SOL ELECTROSURG ANTI STICK (MISCELLANEOUS) ×1
SOLUTION ELECTROSURG ANTI STCK (MISCELLANEOUS) ×1 IMPLANT
SPIKE FLUID TRANSFER (MISCELLANEOUS) ×2 IMPLANT
SPONGE T-LAP 4X18 ~~LOC~~+RFID (SPONGE) IMPLANT
SUT MNCRL 4-0 (SUTURE) ×1
SUT MNCRL 4-0 27XMFL (SUTURE) ×1
SUT VICRYL 0 UR6 27IN ABS (SUTURE) ×1 IMPLANT
SUTURE MNCRL 4-0 27XMF (SUTURE) ×1 IMPLANT
SYS BAG RETRIEVAL 10MM (BASKET) ×1
SYSTEM BAG RETRIEVAL 10MM (BASKET) ×1 IMPLANT
TRAP FLUID SMOKE EVACUATOR (MISCELLANEOUS) ×1 IMPLANT
WATER STERILE IRR 500ML POUR (IV SOLUTION) ×1 IMPLANT

## 2023-03-08 NOTE — Anesthesia Procedure Notes (Addendum)
Procedure Name: Intubation Date/Time: 03/08/2023 9:11 AM  Performed by: Genia Del, CRNAPre-anesthesia Checklist: Patient identified, Patient being monitored, Timeout performed, Emergency Drugs available and Suction available Patient Re-evaluated:Patient Re-evaluated prior to induction Oxygen Delivery Method: Circle system utilized Preoxygenation: Pre-oxygenation with 100% oxygen Induction Type: IV induction Ventilation: Mask ventilation without difficulty Laryngoscope Size: McGraph and 4 Grade View: Grade II Tube type: Oral Tube size: 7.0 mm Number of attempts: 1 Airway Equipment and Method: Stylet Placement Confirmation: ETT inserted through vocal cords under direct vision, positive ETCO2 and breath sounds checked- equal and bilateral Secured at: 22 cm Tube secured with: Tape Dental Injury: Teeth and Oropharynx as per pre-operative assessment  Comments: Eyes taped prior to intubation.   Overbite; anterior grade II view of cords.  Atraumatic intubation with one DL using McGrath videolarygoscope.

## 2023-03-08 NOTE — Anesthesia Postprocedure Evaluation (Signed)
Anesthesia Post Note  Patient: Jaclyn Prime  Procedure(s) Performed: XI ROBOTIC ASSISTED LAPAROSCOPIC CHOLECYSTECTOMY (Abdomen) INDOCYANINE GREEN FLUORESCENCE IMAGING (ICG) (Abdomen)  Patient location during evaluation: PACU Anesthesia Type: General Level of consciousness: awake and alert Pain management: pain level controlled Vital Signs Assessment: post-procedure vital signs reviewed and stable Respiratory status: spontaneous breathing, nonlabored ventilation, respiratory function stable and patient connected to nasal cannula oxygen Cardiovascular status: blood pressure returned to baseline and stable Postop Assessment: no apparent nausea or vomiting Anesthetic complications: no  No notable events documented.   Last Vitals:  Vitals:   03/08/23 1115 03/08/23 1129  BP: 121/62 (!) 108/52  Pulse: 65 72  Resp: 18 16  Temp: (!) 36.1 C (!) 36.2 C  SpO2: 99% 97%    Last Pain:  Vitals:   03/08/23 1129  TempSrc: Temporal  PainSc: 2                  Stephanie Coup

## 2023-03-08 NOTE — Discharge Instructions (Addendum)
  Diet: Resume home heart healthy regular diet.   Activity: No heavy lifting >20 pounds (children, pets, laundry, garbage) or strenuous activity until follow-up, but light activity and walking are encouraged. Do not drive or drink alcohol if taking narcotic pain medications.  Wound care: May shower with soapy water and pat dry (do not rub incisions), but no baths or submerging incision underwater until follow-up. (no swimming)   Medications: Resume all home medications. For mild to moderate pain: acetaminophen (Tylenol) or ibuprofen (if no kidney disease). Combining Tylenol with alcohol can substantially increase your risk of causing liver disease. Narcotic pain medications, if prescribed, can be used for severe pain, though may cause nausea, constipation, and drowsiness. Do not combine Tylenol and Norco within a 6 hour period as Norco contains Tylenol. If you do not need the narcotic pain medication, you do not need to fill the prescription.  Call office ([phone]) at any time if any questions, worsening pain, fevers/chills, bleeding, drainage from incision site, or other concerns.   AMBULATORY SURGERY  DISCHARGE INSTRUCTIONS   The drugs that you were given will stay in your system until tomorrow so for the next 24 hours you should not:  Drive an automobile Make any legal decisions Drink any alcoholic beverage   You may resume regular meals tomorrow.  Today it is better to start with liquids and gradually work up to solid foods.  You may eat anything you prefer, but it is better to start with liquids, then soup and crackers, and gradually work up to solid foods.   Please notify your doctor immediately if you have any unusual bleeding, trouble breathing, redness and pain at the surgery site, drainage, fever, or pain not relieved by medication.      Please contact your physician with any problems or Same Day Surgery at [phone], Monday through Friday 6 am to 4 pm, or [facility] at  Main number at [phone].  

## 2023-03-08 NOTE — Op Note (Signed)
Preoperative diagnosis: Chronic cholecystitis   Postoperative diagnosis: Same   Procedure: Robotic Assisted Laparoscopic Cholecystectomy.    Anesthesia: GETA   Surgeon: Dr. Hazle Quant   Wound Classification: Clean Contaminated   Indications: Patient is a 89 y.o. male developed right upper quadrant pain and on workup was found to have cholelithiasis with a normal common duct and history of choledocholithiasis. Robotic Assisted Laparoscopic cholecystectomy was elected.   Findings: Chronically inflammatory changes of the gallbladder Critical view of safety achieved Cystic duct and artery identified, ligated and divided Adequate hemostasis   Description of procedure: The patient was placed on the operating table in the supine position. General anesthesia was induced. A time-out was completed verifying correct patient, procedure, site, positioning, and implant(s) and/or special equipment prior to beginning this procedure. An orogastric tube was placed. The abdomen was prepped and draped in the usual sterile fashion.  An incision was made in a natural skin line below the umbilicus.  The fascia was elevated and the Veress needle inserted. Proper position was confirmed by aspiration and saline meniscus test.  The abdomen was insufflated with carbon dioxide to a pressure of 15 mmHg. The patient tolerated insufflation well. A 8-mm trocar was then inserted in optiview fashion.  The laparoscope was inserted and the abdomen inspected. No injuries from initial trocar placement were noted. Additional trocars were then inserted in the following locations: an 8-mm trocar in the left lateral abdomen, and another two 8-mm trocars to the right side of the abdomen 5 cm appart. The umbilical trocar was changed to a 12 mm trocar all under direct visualization. The abdomen was inspected and no abnormalities were found. The table was placed in the reverse Trendelenburg position with the right side up. The robotic  arms were docked and target anatomy identified. Instrument inserted under direct visualization.  Filmy adhesions between the gallbladder and omentum, duodenum and transverse colon were lysed with electrocautery. The dome of the gallbladder was grasped with a prograsp and retracted over the dome of the liver. The infundibulum was also grasped with an atraumatic grasper and retracted toward the right lower quadrant. This maneuver exposed Calot's triangle. The peritoneum overlying the gallbladder infundibulum was then incised and the cystic duct and cystic artery identified and circumferentially dissected. Critical view of safety reviewed before ligating any structure. Firefly images taken to visualize biliary ducts. The cystic duct and cystic artery were then doubly clipped and divided close to the gallbladder.  The gallbladder was then dissected from its peritoneal attachments by electrocautery. Hemostasis was checked and the gallbladder and contained stones were removed using an endoscopic retrieval bag. The gallbladder was passed off the table as a specimen. The gallbladder fossa was copiously irrigated with saline and hemostasis was obtained. There was no evidence of bleeding from the gallbladder fossa or cystic artery or leakage of the bile from the cystic duct stump. Secondary trocars were removed under direct vision. No bleeding was noted. The robotic arms were undoked. The scope was withdrawn and the umbilical trocar removed. The abdomen was allowed to collapse. The fascia of the 12mm trocar sites was closed with figure-of-eight 0 vicryl sutures. The skin was closed with subcuticular sutures of 4-0 monocryl and topical skin adhesive. The orogastric tube was removed.  The patient tolerated the procedure well and was taken to the postanesthesia care unit in stable condition.    Specimen: Gallbladder   Complications: None   EBL: 50 mL

## 2023-03-08 NOTE — Transfer of Care (Signed)
Immediate Anesthesia Transfer of Care Note  Patient: George Owens  Procedure(s) Performed: XI ROBOTIC ASSISTED LAPAROSCOPIC CHOLECYSTECTOMY (Abdomen) INDOCYANINE GREEN FLUORESCENCE IMAGING (ICG) (Abdomen)  Patient Location: PACU  Anesthesia Type:General  Level of Consciousness: awake and alert   Airway & Oxygen Therapy: Patient Spontanous Breathing and Patient connected to face mask oxygen  Post-op Assessment: Report given to RN and Post -op Vital signs reviewed and stable  Post vital signs: Reviewed and stable  Last Vitals: Normal temp  Vitals Value Taken Time  BP 124/64 03/08/23 1045  Temp    Pulse 72 03/08/23 1048  Resp 21 03/08/23 1048  SpO2 100 % 03/08/23 1048  Vitals shown include unfiled device data.  Last Pain:  Vitals:   03/08/23 0730  TempSrc: Temporal  PainSc: 0-No pain      Patients Stated Pain Goal: 0 (03/08/23 0730)  Complications: No notable events documented.

## 2023-03-08 NOTE — Interval H&P Note (Signed)
History and Physical Interval Note:  03/08/2023 8:32 AM  George Owens  has presented today for surgery, with the diagnosis of choledocholithiasis w chronic cholecystitis.  The various methods of treatment have been discussed with the patient and family. After consideration of risks, benefits and other options for treatment, the patient has consented to  Procedure(s): XI ROBOTIC ASSISTED LAPAROSCOPIC CHOLECYSTECTOMY (N/A) as a surgical intervention.  The patient's history has been reviewed, patient examined, no change in status, stable for surgery.  I have reviewed the patient's chart and labs.  Questions were answered to the patient's satisfaction.     Carolan Shiver

## 2023-03-08 NOTE — Anesthesia Preprocedure Evaluation (Addendum)
Anesthesia Evaluation  Patient identified by MRN, date of birth, ID band Patient awake    Reviewed: Allergy & Precautions, NPO status , Patient's Chart, lab work & pertinent test results  History of Anesthesia Complications (+) DIFFICULT AIRWAY and history of anesthetic complications  Airway Mallampati: III  TM Distance: <3 FB Neck ROM: full    Dental  (+) Chipped   Pulmonary neg shortness of breath   Pulmonary exam normal        Cardiovascular Exercise Tolerance: Good hypertension, (-) angina + CAD  (-) Past MI and (-) DOE Normal cardiovascular exam     Neuro/Psych  PSYCHIATRIC DISORDERS      negative neurological ROS     GI/Hepatic Neg liver ROS,GERD  Controlled,,  Endo/Other  diabetes    Renal/GU      Musculoskeletal   Abdominal   Peds  Hematology negative hematology ROS (+)   Anesthesia Other Findings Past Medical History: No date: Adenocarcinoma of pancreas (HCC)     Comment:  a.) 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 12/2018 No date: BPH (benign prostatic hyperplasia) No date: Chicken pox No date: Choledocholithiasis 05/12/2020: Cirrhosis of liver (HCC) No date: Colon polyps No date: Coronary artery calcification seen on CT scan 03/16/2020: COVID-19 virus infection 10/09/2020: Diastolic dysfunction     Comment:  a.) TTE 10/09/2020: EF 60-65%, no RWMAs, normal RVSF, Ao              root 39 mm, G1DD No date: GERD (gastroesophageal reflux disease) No date: Hemorrhoids No date: Hepatic steatosis No date: Hyperlipidemia No date: Hypertension No date: Leg cramps No date: Neuropathy No date: Osteoporosis      Comment:  a.) on denosumab injections 02/25/2021: Paroxysmal A-fib (HCC)     Comment:  a.) developed postop following distal pancreatectomy and              splenectomy; b.) CHA2DS2-VASc = 4 (age x2, HTN, vascular               disease history); b.) cardiac rate/rhythm maintained on               oral metoprolol succinate; no chronic OAC 2006: Prostate cancer (HCC) No date: Renal artery aneurysm (HCC) No date: Secondary diabetes (HCC) 04/29/2020: Thoracic ascending aortic aneurysm (HCC)     Comment:  a.) CT chest 04/29/2020: 4.7 cm; b.) CT CAP 08/04/2021:               4.6 cm; c.) CT chest 10/08/2021: 4.5 cm; d.) CT chest               02/25/2022: 4.6 cm; e.) CT chest 10/07/2022: 4.5 cm 02/16/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  Past Surgical History: 11/01/2022: BRONCHIAL BRUSHINGS     Comment:  Procedure: BRONCHIAL BRUSHINGS;  Surgeon: Raechel Chute, MD;  Location: [facility] ENDOSCOPY;  Service:               Pulmonary;; 11/01/2022: BRONCHIAL  NEEDLE ASPIRATION BIOPSY     Comment:  Procedure: BRONCHIAL NEEDLE ASPIRATION BIOPSIES;                Surgeon: Raechel Chute, MD;  Location: [facility] ENDOSCOPY;                Service: Pulmonary;; 11/01/2022: BRONCHIAL WASHINGS     Comment:  Procedure: BRONCHIAL WASHINGS;  Surgeon: Raechel Chute,              MD;  Location: [facility] ENDOSCOPY;  Service: Pulmonary;; No date: CATARACT EXTRACTION; Right 12/26/2017: CATARACT EXTRACTION W/PHACO; Left     Comment:  Procedure: CATARACT EXTRACTION PHACO AND INTRAOCULAR               LENS PLACEMENT (IOC)  COMPLICATED LEFT;  Surgeon:               Lockie Mola, MD;  Location: [facility];              Service: Ophthalmology;  Laterality: Left;                MALYUGIN VISION BLUE No date: colonoscopy No date: ESOPHAGOGASTRODUODENOSCOPY (EGD) WITH PROPOFOL No date: EYE MUSCLE SURGERY; Left 11/11/2021: IR KYPHO THORACIC WITH BONE BIOPSY 02/25/2021: LAPAROSCOPIC SPLENECTOMY 10/19/2020: PORTA  CATH INSERTION; N/A     Comment:  Procedure: PORTA CATH INSERTION;  Surgeon: Annice Needy,              MD;  Location: [facility] INVASIVE CV LAB;  Service:               Cardiovascular;  Laterality: N/A; No date: TONSILLECTOMY AND ADENOIDECTOMY 02/25/2021: XI ROBOTIC ASSISTED LAPAROSCOPIC DISTAL PANCREATECTOMY  BMI    Body Mass Index: 22.50 kg/m      Reproductive/Obstetrics negative OB ROS                             Anesthesia Physical Anesthesia Plan  ASA: 3  Anesthesia Plan: General ETT   Post-op Pain Management:    Induction: Intravenous  PONV Risk Score and Plan: Ondansetron, Dexamethasone, Midazolam and Treatment may vary due to age or medical condition  Airway Management Planned: Oral ETT  Additional Equipment:   Intra-op Plan:   Post-operative Plan: Extubation in OR  Informed Consent: I have reviewed the patients History and Physical, chart, labs and discussed the procedure including the risks, benefits and alternatives for the proposed anesthesia with the patient or authorized representative who has indicated his/her understanding and acceptance.     Dental Advisory Given  Plan Discussed with: Anesthesiologist, CRNA and Surgeon  Anesthesia Plan Comments: (Patient and family consented for risks of anesthesia including but not limited to:  - adverse reactions to medications - damage to eyes, teeth, lips or other oral mucosa - nerve damage due to positioning  - sore throat or hoarseness - Damage to heart, brain, nerves, lungs, other parts of body or loss of life  They voiced understanding.)       Anesthesia Quick Evaluation

## 2023-03-10 LAB — SURGICAL PATHOLOGY

## 2023-03-22 ENCOUNTER — Inpatient Hospital Stay: Payer: PPO | Attending: Oncology

## 2023-03-22 DIAGNOSIS — Z452 Encounter for adjustment and management of vascular access device: Secondary | ICD-10-CM | POA: Insufficient documentation

## 2023-03-22 DIAGNOSIS — Z95828 Presence of other vascular implants and grafts: Secondary | ICD-10-CM

## 2023-03-22 DIAGNOSIS — Z8507 Personal history of malignant neoplasm of pancreas: Secondary | ICD-10-CM | POA: Diagnosis not present

## 2023-03-22 MED ORDER — SODIUM CHLORIDE 0.9% FLUSH
10.0000 mL | Freq: Once | INTRAVENOUS | Status: AC
  Administered 2023-03-22: 10 mL via INTRAVENOUS
  Filled 2023-03-22: qty 10

## 2023-03-22 MED ORDER — HEPARIN SOD (PORK) LOCK FLUSH 100 UNIT/ML IV SOLN
500.0000 [IU] | Freq: Once | INTRAVENOUS | Status: AC
  Administered 2023-03-22: 500 [IU] via INTRAVENOUS
  Filled 2023-03-22: qty 5

## 2023-03-24 ENCOUNTER — Telehealth: Payer: Self-pay | Admitting: Student in an Organized Health Care Education/Training Program

## 2023-03-24 DIAGNOSIS — R911 Solitary pulmonary nodule: Secondary | ICD-10-CM

## 2023-03-24 DIAGNOSIS — C251 Malignant neoplasm of body of pancreas: Secondary | ICD-10-CM

## 2023-03-24 NOTE — Telephone Encounter (Signed)
Discussed procedure with patient over the phone. He would like to proceed with repeat robotic assisted nav bronch. Plan for procedure on 04/18/2023.  Raechel Chute, MD [facility] 03/24/2023 4:22 PM

## 2023-03-24 NOTE — Telephone Encounter (Signed)
George Owens is willing to do bronch on 10/29. He is aware Dr. Aundria Rud will call him either Friday afternoon or Monday. Nothing further needed

## 2023-03-27 ENCOUNTER — Telehealth: Payer: Self-pay | Admitting: Internal Medicine

## 2023-03-27 NOTE — Telephone Encounter (Signed)
Pt called wanting to know if the provider recommend him still getting a prolia shot if so he want the script to go to [facility] on [address]

## 2023-03-28 ENCOUNTER — Other Ambulatory Visit: Payer: Self-pay | Admitting: Internal Medicine

## 2023-03-28 DIAGNOSIS — M810 Age-related osteoporosis without current pathological fracture: Secondary | ICD-10-CM

## 2023-03-28 MED ORDER — PROLIA 60 MG/ML ~~LOC~~ SOSY
60.0000 mg | PREFILLED_SYRINGE | SUBCUTANEOUS | 1 refills | Status: DC
Start: 2023-03-28 — End: 2023-09-12

## 2023-03-28 NOTE — Telephone Encounter (Signed)
That would be up to the patient. I have reached out to this patient several times in the past.

## 2023-03-29 NOTE — Telephone Encounter (Signed)
LMTCB. Need to let pt know that the Prolia has been sent to his pharmacy.

## 2023-03-30 NOTE — Telephone Encounter (Signed)
Pt called back, I read the note and he verbalized understanding

## 2023-03-30 NOTE — Telephone Encounter (Signed)
noted 

## 2023-04-03 ENCOUNTER — Encounter: Payer: Self-pay | Admitting: Student in an Organized Health Care Education/Training Program

## 2023-04-05 ENCOUNTER — Telehealth: Payer: Self-pay | Admitting: Internal Medicine

## 2023-04-05 NOTE — Telephone Encounter (Signed)
Chart has been updated.

## 2023-04-05 NOTE — Telephone Encounter (Signed)
Pt called stating he had a prolia injection on 10/11 also a covid and flu shot on 10/2

## 2023-04-11 ENCOUNTER — Other Ambulatory Visit: Payer: Self-pay | Admitting: Gastroenterology

## 2023-04-12 ENCOUNTER — Telehealth: Payer: Self-pay | Admitting: Gastroenterology

## 2023-04-12 NOTE — Telephone Encounter (Signed)
Pt requesting call back to discuss Broncoscopy procedure on 10/29/02024 to see if there is any need to schedule a GI appointment

## 2023-04-12 NOTE — Telephone Encounter (Signed)
Patient is having a Bronchoscopy on 04/18/2023 for a mass in his lungs. He states everybody thinks it is cancer but he does not believe that. He states he is wanting to know if he should be seen before the procedure because he has loss 6 pounds in 3 weeks. He states that has him worried because he has not changed any eating habits. He states he can not afford to lose much weight. He states he eats a lot of protein and drinks a protein shake every other day. He states he eats a boiled egg every day. He states he is also worried his body is not processing the food correctly. He states when he has bowel movements they are glossy and in little pieces. He states it not diarrhea but lose. He states sometimes the stool is dark in color.

## 2023-04-13 NOTE — Telephone Encounter (Signed)
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  I did not see Creon on his active medication list.  Not sure if he is taking it or not If not, recommend to restart Creon 36K or Zenpep 40K 2 capsules with first bite of each meal and 1 with snack He should undergo bronchoscopy as recommended by his oncologist  RV

## 2023-04-14 ENCOUNTER — Encounter (HOSPITAL_COMMUNITY): Payer: Self-pay | Admitting: Student in an Organized Health Care Education/Training Program

## 2023-04-14 MED ORDER — PANCRELIPASE (LIP-PROT-AMYL) 36000-114000 UNITS PO CPEP: ORAL_CAPSULE | ORAL | 5 refills | Status: DC

## 2023-04-14 NOTE — Telephone Encounter (Signed)
Patient called back to speak with Morrie Sheldon. Tranfers the call.

## 2023-04-14 NOTE — Progress Notes (Signed)
PCP - Dr Duncan Dull Cardiologist - Dr Darrold Junker @ [facility]  Chest x-ray - 01/10/23 EKG - 01/10/23 Stress Test - n/a ECHO - 10/09/20 Cardiac Cath - n/a  ICD Pacemaker/Loop - n/a  Sleep Study -  Yes (04/2020), normal  Secondary Diabetes, no meds, does not check blood sugar.   NPO  Anesthesia review: Yes  STOP now taking any Aspirin (unless otherwise instructed by your surgeon), Aleve, Naproxen, Ibuprofen, Motrin, Advil, Goody's, BC's, all herbal medications, fish oil, and all vitamins.   Coronavirus Screening Do you have any of the following symptoms:  Cough yes/no: No Fever (>100.35F)  yes/no: No Runny nose yes/no: No Sore throat yes/no: No Difficulty breathing/shortness of breath  yes/no: No  Have you traveled in the last 14 days and where? yes/no: No  Patient verbalized understanding of instructions that were given via phone.

## 2023-04-14 NOTE — Addendum Note (Signed)
Addended by: Radene Knee L on: 04/14/2023 09:13 AM   Modules accepted: Orders

## 2023-04-14 NOTE — Telephone Encounter (Signed)
Patient called back and states the medication is putting him in the doughnut hole.  He states he really does not think he needs the medication fully but he will try it to see if it helps him. He states he will need patient assistance on the medication. He will come by and sign his portion on Monday

## 2023-04-14 NOTE — Telephone Encounter (Signed)
Patient wife who is on patient DPR verbalized understanding. She states yes he had stopped taking the medication.. Sent medication to the pharmacy

## 2023-04-17 ENCOUNTER — Other Ambulatory Visit: Payer: Self-pay

## 2023-04-17 ENCOUNTER — Telehealth: Payer: Self-pay | Admitting: Student in an Organized Health Care Education/Training Program

## 2023-04-17 ENCOUNTER — Encounter (HOSPITAL_COMMUNITY): Payer: Self-pay | Admitting: Student in an Organized Health Care Education/Training Program

## 2023-04-17 ENCOUNTER — Ambulatory Visit
Admission: RE | Admit: 2023-04-17 | Discharge: 2023-04-17 | Disposition: A | Discharge status: Home / Self Care | Payer: PPO | Source: Ambulatory Visit | Attending: Student in an Organized Health Care Education/Training Program | Admitting: Student in an Organized Health Care Education/Training Program

## 2023-04-17 DIAGNOSIS — C251 Malignant neoplasm of body of pancreas: Secondary | ICD-10-CM | POA: Diagnosis not present

## 2023-04-17 DIAGNOSIS — C61 Malignant neoplasm of prostate: Secondary | ICD-10-CM | POA: Diagnosis not present

## 2023-04-17 DIAGNOSIS — C7802 Secondary malignant neoplasm of left lung: Secondary | ICD-10-CM | POA: Diagnosis not present

## 2023-04-17 DIAGNOSIS — R911 Solitary pulmonary nodule: Secondary | ICD-10-CM | POA: Insufficient documentation

## 2023-04-17 DIAGNOSIS — C7801 Secondary malignant neoplasm of right lung: Secondary | ICD-10-CM | POA: Diagnosis not present

## 2023-04-17 NOTE — Progress Notes (Signed)
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 He is s/p XI robotic assisted laparoscopic cholecystectomy 03/08/23 at [facility]. S/p video bronchoscopy with robotic assisted bronchoscopic navigation on 11/01/22. RLL FNA, brushing, and lavage did not show malignant cells. 02/16/23 PET scan showed increased size and metabolic activity of bilateral pulmonary nodules and above procedure planned.   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   Patient is followed by cardiology Marcina Millard, MD). He was last seen in the cardiology clinic on 10/24/2022. At the time of his clinic visit, patient doing well overall from a cardiovascular perspective. He was typically walking 2-3 miles/day. Infrequent episodes SOB and tachycardia. He was awaiting bronchoscopy, and felt to be low, and acceptable risk for CV standpoint at that time.   He is a same day work-up, so anesthesia team to evaluate on the day of surgery.   VS: Ht 6\' 3"  (1.905 m)   Wt 78.5 kg   BMI 21.62 kg/m  BP Readings from  Last 3 Encounters:  03/08/23 (!) 108/52  01/31/23 121/77  01/12/23 114/65   Pulse Readings from Last 3 Encounters:  03/08/23 72  01/31/23 63  01/12/23 67     PROVIDERS: [name], MD is PCP  Marcina Millard, MD is cardiologist  Owens Shark, MD is Loralie Champagne, MD is GI Lucky Rathke, MD is general surgeon   LABS: Most recent lab results include: Lab Results  Component Value Date   WBC 7.3 01/19/2023   HGB 13.5 01/19/2023   HCT 42.0 01/19/2023   PLT 363.0 01/19/2023   GLUCOSE 125 (H) 01/19/2023   ALT 39 01/19/2023   AST 30 01/19/2023   NA 137 01/19/2023   K 4.2 01/19/2023   CL 97 01/19/2023   CREATININE 0.95 01/19/2023   BUN 24 (H) 01/19/2023   CO2 33 (H) 01/19/2023   TSH 5.68 (H) 07/29/2022   HGBA1C 7.0 (H) 01/19/2023     IMAGES: Super D Chest CT 04/17/23: Report in process.   PET Scan 02/16/23: IMPRESSION: 1. Increase in size and metabolic activity of bilateral pulmonary nodules consistent with progression of pulmonary metastasis. 2. No evidence of local recurrence in the pancreas. 3. No evidence of metastatic adenopathy in the abdomen pelvis. 4. Intense metabolic activity associated with low-density lesion in the isthmus of the thyroid gland not changed from comparison exam.   CT Abd/pelvis 01/10/23:  IMPRESSION: - Again stable surgical changes of distal pancreatectomy and splenectomy. - Distended gallbladder with stones and possible wall thickening. If there is further concern of acute gallbladder pathology, an ultrasound may be useful as the next step in the  workup. - Known pulmonary metastases. - Stable right renal artery aneurysm.   EKG: 01/10/23: Sinus rhythm Ventricular premature complex Anterior infarct, old Confirmed by UNCONFIRMED, DOCTOR ([id number]), editor Kearney Hard [phone] on 01/12/2023 9:47:29 AM   CV: 14-day Holter monitor 03/03/2021 - 03/17/2021 (as outlined by Gavin Potters cardiologist): "predominant sinus  rhythm, frequent PVCs, rare brief atrial runs, without significant/sustained episodes of atrial fibrillation."   Echo 10/09/20: IMPRESSIONS   1. Left ventricular ejection fraction, by estimation, is 60 to 65%. The  left ventricle has normal function. The left ventricle has no regional  wall motion abnormalities. Left ventricular diastolic parameters are  consistent with Grade I diastolic  dysfunction (impaired relaxation).   2. Right ventricular systolic function is normal. The right ventricular  size is normal.   3. The mitral valve is normal in structure. No evidence of mitral valve  regurgitation.   4. The aortic valve was not well visualized.Unable to determine number of  leaflets. Aortic valve regurgitation is not visualized. No aortic stenosis  is present.   5. Aortic ascending aorta not well visualized.There is mild dilatation of  the aortic root, measuring 39 mm.    Past Medical History:  Diagnosis Date   Adenocarcinoma of pancreas (HCC)    a.) stage 1B poorly differentiated adenocarcinoma of the pancreas (grade 3) s/p distal pancreatectomy and splenectomy (negative margins); 15 lymph nodes negative for malignancy   Allergy    Aortic atherosclerosis (HCC)    Basal cell carcinoma    right temple 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   Colon polyps    Coronary artery calcification seen on CT scan    COVID-19 virus infection 03/16/2020   Diastolic dysfunction 10/09/2020   a.) TTE 10/09/2020: EF 60-65%, no RWMAs, normal RVSF, Ao root 39 mm, G1DD   GERD (gastroesophageal reflux disease)    Hemorrhoids    Hepatic steatosis    Hyperlipidemia    Hypertension    Leg cramps    Lung nodules 10/07/2021   Neuropathy    Osteoporosis    a.) on denosumab injections   Paroxysmal A-fib (HCC) 02/25/2021   a.) developed postop following distal pancreatectomy and splenectomy; b.)  CHA2DS2-VASc = 4 (age x2, HTN, vascular disease history); b.) cardiac rate/rhythm maintained on oral metoprolol succinate; no chronic OAC   Pneumonia 2022   Prostate cancer (HCC) 2006   Renal artery aneurysm (HCC)    Secondary diabetes (HCC)    does not check blood sugar   Thoracic ascending aortic aneurysm (HCC) 04/29/2020   a.) CT chest 04/29/2020: 4.7 cm; b.) CT CAP 08/04/2021: 4.6 cm; c.) CT chest 10/08/2021: 4.5 cm; d.) CT chest 02/25/2022: 4.6 cm; e.) CT chest 10/07/2022: 4.5 cm   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    Past Surgical History:  Procedure Laterality Date   BRONCHIAL BRUSHINGS  11/01/2022   Procedure: BRONCHIAL BRUSHINGS;  Surgeon: Raechel Chute, MD;  [facility];  Service: Pulmonary;;   BRONCHIAL NEEDLE ASPIRATION BIOPSY  11/01/2022   Procedure: BRONCHIAL NEEDLE ASPIRATION BIOPSIES;  Surgeon: Raechel Chute, MD;  Location: [facility];  Service: Pulmonary;;   BRONCHIAL WASHINGS  11/01/2022   Procedure: BRONCHIAL WASHINGS;  Surgeon: Raechel Chute, MD;  Location: [facility] ENDOSCOPY;  Service: Pulmonary;;   CATARACT EXTRACTION Right    CATARACT EXTRACTION W/PHACO Left 12/26/2017   Procedure: CATARACT EXTRACTION PHACO AND INTRAOCULAR LENS PLACEMENT (IOC)  COMPLICATED LEFT;  Surgeon:  Lockie Mola, MD;  Location: [facility];  Service: Ophthalmology;  Laterality: Left;  MALYUGIN VISION BLUE   CHOLECYSTECTOMY  03/01/2023   at [facility]   colonoscopy     ESOPHAGOGASTRODUODENOSCOPY (EGD) WITH PROPOFOL     EYE MUSCLE SURGERY Left    IR KYPHO THORACIC WITH BONE BIOPSY  11/11/2021   LAPAROSCOPIC SPLENECTOMY  02/25/2021   PORTA CATH INSERTION N/A 10/19/2020   Procedure: PORTA CATH INSERTION;  Surgeon: Annice Needy, MD;  Location: [facility] INVASIVE CV LAB;  Service: Cardiovascular;  Laterality: N/A;   TONSILLECTOMY AND ADENOIDECTOMY     XI ROBOTIC ASSISTED LAPAROSCOPIC DISTAL PANCREATECTOMY  02/25/2021    MEDICATIONS: No current facility-administered medications  for this encounter.    acetaminophen (TYLENOL) 500 MG tablet   Blood Glucose Monitoring Suppl (ONETOUCH VERIO) w/Device KIT   Blood Glucose Monitoring Suppl DEVI   Calcium Carb-Cholecalciferol (CALCIUM 600 + D PO)   cholecalciferol (VITAMIN D3) 25 MCG (1000 UNIT) tablet   Cranberry 500 MG CAPS   denosumab (PROLIA) 60 MG/ML SOSY injection   Glucose Blood (BLOOD GLUCOSE TEST STRIPS) STRP   glucose blood (ONETOUCH VERIO) test strip   Lancets (ONETOUCH ULTRASOFT) lancets   lipase/protease/amylase (CREON) 36000 UNITS CPEP capsule   loratadine (CLARITIN) 10 MG tablet   metoprolol succinate (TOPROL-XL) 25 MG 24 hr tablet   Misc Natural Products (JOINT HEALTH PO)   naproxen sodium (ALEVE) 220 MG tablet   omeprazole (PRILOSEC) 40 MG capsule   Polyethyl Glycol-Propyl Glycol (SYSTANE OP)   tamsulosin (FLOMAX) 0.4 MG CAPS capsule   traMADol (ULTRAM) 50 MG tablet   vitamin B-12 (CYANOCOBALAMIN) 1000 MCG tablet    heparin lock flush 100 UNIT/ML injection   heparin lock flush 100 unit/mL    Shonna Chock, PA-C Surgical Short Stay/Anesthesiology [facility] Phone [phone] [facility] Phone [phone] 04/17/2023 2:11 PM

## 2023-04-17 NOTE — Telephone Encounter (Signed)
Pt calling in to ask if he needs to bring in his CT Scan CD after he has the scan done today

## 2023-04-17 NOTE — Anesthesia Preprocedure Evaluation (Signed)
Anesthesia Evaluation  Patient identified by MRN, date of birth, ID band Patient awake    Reviewed: Allergy & Precautions, NPO status , Patient's Chart, lab work & pertinent test results, reviewed documented beta blocker date and time   History of Anesthesia Complications Negative for: history of anesthetic complications  Airway Mallampati: III  TM Distance: <3 FB Neck ROM: Full    Dental  (+) Dental Advisory Given, Teeth Intact   Pulmonary neg pulmonary ROS   Pulmonary exam normal        Cardiovascular hypertension, Pt. on medications and Pt. on home beta blockers + CAD  Normal cardiovascular exam+ dysrhythmias Atrial Fibrillation    '22 TTE - EF 60 to 65%. Grade I diastolic dysfunction (impaired relaxation). Aortic ascending aorta not well visualized.There is mild dilatation of the aortic root, measuring 39 mm.     Neuro/Psych  PSYCHIATRIC DISORDERS Anxiety     negative neurological ROS     GI/Hepatic ,GERD  Medicated and Controlled,,(+) Cirrhosis        Pancreatic neoplasm    Endo/Other  diabetes, Type 2    Renal/GU negative Renal ROS    Prostate cancer     Musculoskeletal negative musculoskeletal ROS (+)    Abdominal   Peds  Hematology negative hematology ROS (+)   Anesthesia Other Findings   Reproductive/Obstetrics                              Anesthesia Physical Anesthesia Plan  ASA: 3  Anesthesia Plan: General   Post-op Pain Management: Minimal or no pain anticipated   Induction: Intravenous  PONV Risk Score and Plan: 2 and Treatment may vary due to age or medical condition and Ondansetron  Airway Management Planned: Oral ETT  Additional Equipment: None  Intra-op Plan:   Post-operative Plan: Extubation in OR  Informed Consent: I have reviewed the patients History and Physical, chart, labs and discussed the procedure including the risks, benefits and  alternatives for the proposed anesthesia with the patient or authorized representative who has indicated his/her understanding and acceptance.     Dental advisory given  Plan Discussed with: CRNA and Anesthesiologist  Anesthesia Plan Comments: (PAT note written 04/17/2023 by Shonna Chock, PA-C.  )        Anesthesia Quick Evaluation

## 2023-04-17 NOTE — Telephone Encounter (Signed)
Spoke to patient. He is aware that he does not need to bring disc. Dr. Aundria Rud can review imaging via epic.  Nothing further needed.

## 2023-04-18 ENCOUNTER — Encounter (HOSPITAL_COMMUNITY)
Admission: RE | Disposition: A | Discharge status: Home / Self Care | Payer: Self-pay | Source: Home / Self Care | Attending: Student in an Organized Health Care Education/Training Program

## 2023-04-18 ENCOUNTER — Ambulatory Visit (HOSPITAL_COMMUNITY): Payer: PPO | Admitting: Vascular Surgery

## 2023-04-18 ENCOUNTER — Encounter (HOSPITAL_COMMUNITY): Payer: Self-pay | Admitting: Student in an Organized Health Care Education/Training Program

## 2023-04-18 ENCOUNTER — Ambulatory Visit (HOSPITAL_COMMUNITY)
Admission: RE | Admit: 2023-04-18 | Discharge: 2023-04-18 | Disposition: A | Discharge status: Home / Self Care | Payer: PPO | Attending: Student in an Organized Health Care Education/Training Program | Admitting: Student in an Organized Health Care Education/Training Program

## 2023-04-18 ENCOUNTER — Ambulatory Visit (HOSPITAL_COMMUNITY): Payer: PPO

## 2023-04-18 ENCOUNTER — Ambulatory Visit (HOSPITAL_BASED_OUTPATIENT_CLINIC_OR_DEPARTMENT_OTHER): Payer: Self-pay | Admitting: Vascular Surgery

## 2023-04-18 ENCOUNTER — Other Ambulatory Visit: Payer: Self-pay

## 2023-04-18 DIAGNOSIS — Z85828 Personal history of other malignant neoplasm of skin: Secondary | ICD-10-CM | POA: Diagnosis not present

## 2023-04-18 DIAGNOSIS — K760 Fatty (change of) liver, not elsewhere classified: Secondary | ICD-10-CM | POA: Insufficient documentation

## 2023-04-18 DIAGNOSIS — Z9221 Personal history of antineoplastic chemotherapy: Secondary | ICD-10-CM | POA: Diagnosis not present

## 2023-04-18 DIAGNOSIS — Z8546 Personal history of malignant neoplasm of prostate: Secondary | ICD-10-CM | POA: Insufficient documentation

## 2023-04-18 DIAGNOSIS — R918 Other nonspecific abnormal finding of lung field: Secondary | ICD-10-CM | POA: Insufficient documentation

## 2023-04-18 DIAGNOSIS — Z9049 Acquired absence of other specified parts of digestive tract: Secondary | ICD-10-CM | POA: Diagnosis not present

## 2023-04-18 DIAGNOSIS — R0989 Other specified symptoms and signs involving the circulatory and respiratory systems: Secondary | ICD-10-CM | POA: Diagnosis not present

## 2023-04-18 DIAGNOSIS — R911 Solitary pulmonary nodule: Secondary | ICD-10-CM | POA: Diagnosis not present

## 2023-04-18 DIAGNOSIS — Z48813 Encounter for surgical aftercare following surgery on the respiratory system: Secondary | ICD-10-CM | POA: Diagnosis not present

## 2023-04-18 DIAGNOSIS — I2510 Atherosclerotic heart disease of native coronary artery without angina pectoris: Secondary | ICD-10-CM | POA: Insufficient documentation

## 2023-04-18 DIAGNOSIS — Z8507 Personal history of malignant neoplasm of pancreas: Secondary | ICD-10-CM | POA: Diagnosis not present

## 2023-04-18 DIAGNOSIS — I480 Paroxysmal atrial fibrillation: Secondary | ICD-10-CM | POA: Diagnosis not present

## 2023-04-18 DIAGNOSIS — K7460 Unspecified cirrhosis of liver: Secondary | ICD-10-CM | POA: Diagnosis not present

## 2023-04-18 DIAGNOSIS — I10 Essential (primary) hypertension: Secondary | ICD-10-CM | POA: Insufficient documentation

## 2023-04-18 DIAGNOSIS — C251 Malignant neoplasm of body of pancreas: Secondary | ICD-10-CM

## 2023-04-18 DIAGNOSIS — K219 Gastro-esophageal reflux disease without esophagitis: Secondary | ICD-10-CM | POA: Insufficient documentation

## 2023-04-18 DIAGNOSIS — R846 Abnormal cytological findings in specimens from respiratory organs and thorax: Secondary | ICD-10-CM | POA: Diagnosis not present

## 2023-04-18 DIAGNOSIS — C259 Malignant neoplasm of pancreas, unspecified: Secondary | ICD-10-CM

## 2023-04-18 DIAGNOSIS — E119 Type 2 diabetes mellitus without complications: Secondary | ICD-10-CM | POA: Insufficient documentation

## 2023-04-18 DIAGNOSIS — I4891 Unspecified atrial fibrillation: Secondary | ICD-10-CM | POA: Diagnosis not present

## 2023-04-18 HISTORY — PX: BRONCHIAL NEEDLE ASPIRATION BIOPSY: SHX5106

## 2023-04-18 LAB — GLUCOSE, CAPILLARY
Glucose-Capillary: 119 mg/dL — ABNORMAL HIGH (ref 70–99)
Glucose-Capillary: 115 mg/dL — ABNORMAL HIGH (ref 70–99)

## 2023-04-18 LAB — CBC
HCT: 39.7 % (ref 39.0–52.0)
Hemoglobin: 13.6 g/dL (ref 13.0–17.0)
MCH: 31.8 pg (ref 26.0–34.0)
MCHC: 34.3 g/dL (ref 30.0–36.0)
MCV: 92.8 fL (ref 80.0–100.0)
Platelets: 236 10*3/uL (ref 150–400)
RBC: 4.28 MIL/uL (ref 4.22–5.81)
RDW: 14.1 % (ref 11.5–15.5)
WBC: 6.2 10*3/uL (ref 4.0–10.5)
nRBC: 0 % (ref 0.0–0.2)

## 2023-04-18 LAB — BASIC METABOLIC PANEL
Anion gap: 12 (ref 5–15)
BUN: 15 mg/dL (ref 8–23)
CO2: 24 mmol/L (ref 22–32)
Calcium: 9.1 mg/dL (ref 8.9–10.3)
Chloride: 104 mmol/L (ref 98–111)
Creatinine, Ser: 0.82 mg/dL (ref 0.61–1.24)
GFR, Estimated: >60 mL/min (ref >60)
Glucose, Bld: 131 mg/dL — ABNORMAL HIGH (ref 70–99)
Potassium: 3.8 mmol/L (ref 3.5–5.1)
Sodium: 140 mmol/L (ref 135–145)

## 2023-04-18 SURGERY — BRONCHOSCOPY, WITH BIOPSY USING ELECTROMAGNETIC NAVIGATION
Anesthesia: General | Laterality: Bilateral

## 2023-04-18 MED ORDER — ONDANSETRON HCL 4 MG/2ML IJ SOLN: 4.0000 mg | Freq: Once | INTRAMUSCULAR | Status: DC | PRN

## 2023-04-18 MED ORDER — INSULIN ASPART 100 UNIT/ML IJ SOLN: 0.0000 [IU] | INTRAMUSCULAR | Status: DC | PRN

## 2023-04-18 MED ORDER — PHENYLEPHRINE HCL-NACL 20-0.9 MG/250ML-% IV SOLN
INTRAVENOUS | Status: DC | PRN
  Administered 2023-04-18: 25 ug/min via INTRAVENOUS

## 2023-04-18 MED ORDER — OXYCODONE HCL 5 MG PO TABS: 5.0000 mg | ORAL_TABLET | Freq: Once | ORAL | Status: DC | PRN

## 2023-04-18 MED ORDER — FENTANYL CITRATE (PF) 250 MCG/5ML IJ SOLN
INTRAMUSCULAR | Status: DC | PRN
  Administered 2023-04-18: 50 ug via INTRAVENOUS

## 2023-04-18 MED ORDER — ONDANSETRON HCL 4 MG/2ML IJ SOLN
INTRAMUSCULAR | Status: DC | PRN
  Administered 2023-04-18: 4 mg via INTRAVENOUS

## 2023-04-18 MED ORDER — PHENYLEPHRINE 80 MCG/ML (10ML) SYRINGE FOR IV PUSH (FOR BLOOD PRESSURE SUPPORT)
PREFILLED_SYRINGE | INTRAVENOUS | Status: DC | PRN
  Administered 2023-04-18: 160 ug via INTRAVENOUS
  Administered 2023-04-18: 80 ug via INTRAVENOUS

## 2023-04-18 MED ORDER — CHLORHEXIDINE GLUCONATE 0.12 % MT SOLN: 15.0000 mL | Freq: Once | OROMUCOSAL | Status: AC

## 2023-04-18 MED ORDER — PROPOFOL 10 MG/ML IV BOLUS
INTRAVENOUS | Status: DC | PRN
  Administered 2023-04-18: 150 mg via INTRAVENOUS
  Administered 2023-04-18: 50 ug/kg/min via INTRAVENOUS

## 2023-04-18 MED ORDER — OXYCODONE HCL 5 MG/5ML PO SOLN: 5.0000 mg | Freq: Once | ORAL | Status: DC | PRN

## 2023-04-18 MED ORDER — SUGAMMADEX SODIUM 200 MG/2ML IV SOLN
INTRAVENOUS | Status: DC | PRN
  Administered 2023-04-18: 200 mg via INTRAVENOUS

## 2023-04-18 MED ORDER — LACTATED RINGERS IV SOLN: INTRAVENOUS | Status: DC

## 2023-04-18 MED ORDER — FENTANYL CITRATE (PF) 100 MCG/2ML IJ SOLN
INTRAMUSCULAR | Status: AC
  Filled 2023-04-18: qty 2

## 2023-04-18 MED ORDER — LIDOCAINE 2% (20 MG/ML) 5 ML SYRINGE
INTRAMUSCULAR | Status: DC | PRN
  Administered 2023-04-18: 60 mg via INTRAVENOUS

## 2023-04-18 MED ORDER — DEXAMETHASONE SODIUM PHOSPHATE 10 MG/ML IJ SOLN
INTRAMUSCULAR | Status: DC | PRN
  Administered 2023-04-18: 5 mg via INTRAVENOUS

## 2023-04-18 MED ORDER — ROCURONIUM BROMIDE 10 MG/ML (PF) SYRINGE
PREFILLED_SYRINGE | INTRAVENOUS | Status: DC | PRN
  Administered 2023-04-18: 60 mg via INTRAVENOUS

## 2023-04-18 MED ORDER — FENTANYL CITRATE (PF) 100 MCG/2ML IJ SOLN: 25.0000 ug | INTRAMUSCULAR | Status: DC | PRN

## 2023-04-18 MED ORDER — CHLORHEXIDINE GLUCONATE 0.12 % MT SOLN
OROMUCOSAL | Status: AC
  Administered 2023-04-18: 15 mL via OROMUCOSAL
  Filled 2023-04-18: qty 15

## 2023-04-18 NOTE — Op Note (Signed)
Video Bronchoscopy with Robotic Assisted Bronchoscopic Navigation   Date of Operation: 04/18/2023   Pre-op Diagnosis: Lung Nodules  Surgeon: Raechel Chute MD, Janann Colonel, MD.   Anesthesia: General endotracheal anesthesia  Operation: Flexible video fiberoptic bronchoscopy with robotic assistance and biopsies.  Estimated Blood Loss: Minimal  Complications: None  Indications and History: George Owens is a 89 y.o. male with history of Pancreatic cancer presenting with lung nodule. The risks, benefits, complications, treatment options and expected outcomes were discussed with the patient.  The possibilities of pneumothorax, pneumonia, reaction to medication, pulmonary aspiration, perforation of a viscus, bleeding, failure to diagnose a condition and creating a complication requiring transfusion or operation were discussed with the patient who freely signed the consent.    Description of Procedure: The patient was seen in the Preoperative Area, was examined and was deemed appropriate to proceed.  The patient was taken to [facility] endoscopy room 3, identified as George Owens and the procedure verified as Flexible Video Fiberoptic Bronchoscopy.  A Time Out was held and the above information confirmed.   Prior to the date of the procedure a high-resolution CT scan of the chest was performed. Utilizing ION software program a virtual tracheobronchial tree was generated to allow the creation of distinct navigation pathways to the patient's parenchymal abnormalities. After being taken to the operating room general anesthesia was initiated and the patient  was orally intubated. The video fiberoptic bronchoscope was introduced via the endotracheal tube and a general inspection was performed which showed normal right and left lung anatomy, aspiration of the bilateral mainstems was completed to remove any remaining secretions. Robotic catheter inserted into patient's endotracheal tube.   Target #1  RLL: The distinct navigation pathways prepared prior to this procedure were then utilized to navigate to patient's lesion identified on CT scan. The robotic catheter was secured into place and the vision probe was withdrawn.  Lesion location was approximated using fluoroscopy and 3D CBCT (Cios Spin) to correct for CT to body divergence. Under fluoroscopic guidance transbronchial needle biopsies were performed to be sent for cytology and pathology.   At the end of the procedure a general airway inspection was performed and there was no evidence of active bleeding. The bronchoscope was removed.  The patient tolerated the procedure well. There was no significant blood loss and there were no obvious complications. A post-procedural chest x-ray is pending.  Samples Target #1: 1. Transbronchial needle biopsies from RLL using 21 G FNA needle with 9-10 passes.   Plans:  The patient will be discharged from the PACU to home when recovered from anesthesia and after chest x-ray is reviewed. We will review the cytology, pathology and microbiology results with the patient when they become available. Outpatient followup will be with Dr. Georga Kaufmann, MD [facility] 04/18/2023 9:10 AM

## 2023-04-18 NOTE — Anesthesia Procedure Notes (Addendum)
Procedure Name: Intubation Date/Time: 04/18/2023 8:23 AM  Performed by: April Holding, CRNAPre-anesthesia Checklist: Patient identified, Emergency Drugs available, Suction available and Patient being monitored Patient Re-evaluated:Patient Re-evaluated prior to induction Oxygen Delivery Method: Circle System Utilized Preoxygenation: Pre-oxygenation with 100% oxygen Induction Type: IV induction Ventilation: Mask ventilation without difficulty Laryngoscope Size: Glidescope and 4 Grade View: Grade I Tube type: Oral Tube size: 8.5 mm Number of attempts: 3 Airway Equipment and Method: Stylet and Oral airway Placement Confirmation: ETT inserted through vocal cords under direct vision, positive ETCO2 and breath sounds checked- equal and bilateral Secured at: 24 cm Tube secured with: Tape Dental Injury: Teeth and Oropharynx as per pre-operative assessment  Difficulty Due To: Difficult Airway- due to anterior larynx and Difficulty was unanticipated Comments: First look with Mil 2, grade IV view. Dr. Mal Amabile achieved Grade 3 view with Mil 3. GlideGo used with size 4 blade, able to visualize cords easily.

## 2023-04-18 NOTE — Transfer of Care (Signed)
Immediate Anesthesia Transfer of Care Note  Patient: George Owens  Procedure(s) Performed: ROBOTIC ASSISTED NAVIGATIONAL BRONCHOSCOPY (Bilateral) BRONCHIAL NEEDLE ASPIRATION BIOPSIES  Patient Location: PACU  Anesthesia Type:General  Level of Consciousness: awake and alert   Airway & Oxygen Therapy: Patient Spontanous Breathing and Patient connected to face mask oxygen  Post-op Assessment: Report given to RN and Post -op Vital signs reviewed and stable  Post vital signs: Reviewed and stable  Last Vitals:  Vitals Value Taken Time  BP 124/72 04/18/23 0912  Temp    Pulse 69 04/18/23 0913  Resp 18 04/18/23 0913  SpO2 96 % 04/18/23 0913  Vitals shown include unfiled device data.  Last Pain:  Vitals:   04/18/23 0729  TempSrc:   PainSc: 0-No pain         Complications:  Encounter Notable Events  Notable Event Outcome Phase Comment  Difficult to intubate - unexpected  Intraprocedure Filed from anesthesia note documentation.

## 2023-04-18 NOTE — H&P (Signed)
Assessment & Plan:   1. 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 Pulmonary nodule 1 cm or greater in diameter   The patient is here to discuss their imaging abnormalities which include multiple pulmonary nodules that include index nodules in the RLL and LLL. The nodules appear larger on repeat imaging and are much more FDG avid compared to prior. This is highly concerning for metastatic pancreatic cancer. I discussed this with Mr. Reish, and explained that the likelihood of malignancy is very high. He understands the risks and benefits of this procedure and wishes to proceed with biopsy, stating that he would like to know what the process behind the nodules is for sure.  I explained to the patient that the risk robotic assisted navigation is generally < 2% (bleeding, PTX etc...). He also understands there's associated risk from anesthesia given his age, including ACS, stroke, and functional decline. We had discussed this in depth prior to his initial procedure as well. He wishes to proceed with biopsy of the nodules. He is appropriate for the procedure.  -proceed with robotic assisted navigational bronchoscopy    Raechel Chute, MD [facility] 04/18/2023 7:53 AM    End of visit medications:  Meds ordered this encounter  Medications   lactated ringers infusion   chlorhexidine (PERIDEX) 0.12 % solution 15 mL   chlorhexidine (PERIDEX) 0.12 % solution    [place], [place] D: cabinet override   insulin aspart (novoLOG) injection 0-7 Units    Order Specific Question:   Correction coverage:    Answer:   Periop Sensitive    Order Specific Question:   CBG < 70:    Answer:   Implement Hypoglycemia Standing Orders and refer to Hypoglycemia Standing Orders sidebar report    Order Specific Question:   CBG 70 - 179:    Answer:   0 units    Order Specific Question:   CBG 180 - 219:    Answer:   2 units    Order Specific Question:   CBG 220 - 259:    Answer:   3  units    Order Specific Question:   CBG 260 - 299:    Answer:   4 units    Order Specific Question:   CBG 300 - 339:    Answer:   5 units. Consult anesthesiologist prior to admin    Order Specific Question:   CBG 340 - 379:    Answer:   6 units. Consult anesthesiologist prior to admin    Order Specific Question:   CBG 380 - 399:    Answer:   7 units. Consult anesthesiologist prior to admin    Order Specific Question:   CBG >399:    Answer:   Notify anesthesiologist     Current Facility-Administered Medications:    insulin aspart (novoLOG) injection 0-7 Units, 0-7 Units, Subcutaneous, Q2H PRN, Beryle Lathe, MD   lactated ringers infusion, , Intravenous, Continuous, Beryle Lathe, MD  Facility-Administered Medications Ordered in Other Encounters:    heparin lock flush 100 UNIT/ML injection, , , ,    heparin lock flush 100 unit/mL, 500 Units, Intravenous, Once, [name], MD   Subjective:   PATIENT ID: George Owens GENDER: male DOB: 24-Sep-1931, MRN: [id number]  No chief complaint on file.   HPI  Patient is a pleasant 89 year old male presenting for biopsy of bilateral pulmonary nodules concerning for metastatic pancreatic cancer.  Patient remains in his usual  state of health. He's had no fevers, no chills, no respiratory symptoms, and no chest pain. He reports weight loss that is non-intentional. He remains healthy and able to ambulate without limitation. He is presenting today in the company of his wife and son.  He does have a history of pancreatic cancer, diagnosed two years ago, and is s/p laparoscopic resection of the pancreatic tail at [facility]. His symptoms initially consisted of abodminal pain, with an abdominal CT in March of 2022 showing a mass in the pancreatic body that was PET avid. He received neoadjuvant chemotherapy for three months followed by laparoscopic resection, with pathology showing poorly differentiated adenocarcinoma of the pancreas with  negative margins. He then completed 4 months of adjuvant chemotherapy.  He followed closely with oncology with sureillance imaging studies showing no local recurrence but with bilateral pulmonary nodules. Those appeared to have grown on repeat imaging. He underwent robotic assisted navigational bronchoscopy to RLL nodule with cytology returning non-diagnostic and without cancer cells. Repeat imaging showed growth in the RLL nodule in addition to other pulmonary nodules. Repeat PET/CT showed the nodules to be intensely PET avid. He is referred for consideration of repeat biopsy. Patient wishes to proceed with repeat biopsy.  Patient has no smoking history, and is overall very active.  Ancillary information including prior medications, full medical/surgical/family/social histories, and PFTs (when available) are listed below and have been reviewed.   Review of Systems  Constitutional:  Positive for weight loss. Negative for chills, diaphoresis, fever and malaise/fatigue.  Respiratory:  Negative for cough, hemoptysis, sputum production, shortness of breath and wheezing.   Cardiovascular:  Negative for chest pain.     Objective:   Vitals:   04/17/23 1027 04/18/23 0654  BP:  127/73  Pulse:  70  Resp:  18  Temp:  (!) 97.5 F (36.4 C)  TempSrc:  Oral  SpO2:  97%  Weight: 78.5 kg 79.4 kg  Height: 6\' 3"  (1.905 m) 6\' 3"  (1.905 m)   97% on RA BMI Readings from Last 3 Encounters:  04/18/23 21.87 kg/m  03/08/23 22.50 kg/m  02/23/23 22.50 kg/m   Wt Readings from Last 3 Encounters:  04/18/23 79.4 kg  03/08/23 81.6 kg  02/23/23 81.6 kg    Physical Exam Constitutional:      Appearance: Normal appearance.  HENT:     Mouth/Throat:     Mouth: Mucous membranes are moist.  Cardiovascular:     Rate and Rhythm: Normal rate and regular rhythm.     Pulses: Normal pulses.     Heart sounds: Normal heart sounds.  Pulmonary:     Effort: Pulmonary effort is normal. No respiratory distress.      Breath sounds: Normal breath sounds. No wheezing or rales.  Neurological:     General: No focal deficit present.     Mental Status: He is alert and oriented to person, place, and time. Mental status is at baseline.       Ancillary Information    Past Medical History:  Diagnosis Date   Adenocarcinoma of pancreas (HCC)    a.) stage 1B poorly differentiated adenocarcinoma of the pancreas (grade 3) s/p distal pancreatectomy and splenectomy (negative margins); 15 lymph nodes negative for malignancy   Allergy    Aortic atherosclerosis (HCC)    Basal cell carcinoma    right temple 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  Colon polyps    Coronary artery calcification seen on CT scan    COVID-19 virus infection 03/16/2020   Diastolic dysfunction 10/09/2020   a.) TTE 10/09/2020: EF 60-65%, no RWMAs, normal RVSF, Ao root 39 mm, G1DD   GERD (gastroesophageal reflux disease)    Hemorrhoids    Hepatic steatosis    Hyperlipidemia    Hypertension    Leg cramps    Lung nodules 10/07/2021   Neuropathy    Osteoporosis    a.) on denosumab injections   Paroxysmal A-fib (HCC) 02/25/2021   a.) developed postop following distal pancreatectomy and splenectomy; b.) CHA2DS2-VASc = 4 (age x2, HTN, vascular disease history); b.) cardiac rate/rhythm maintained on oral metoprolol succinate; no chronic OAC   Pneumonia 2022   Prostate cancer (HCC) 2006   Renal artery aneurysm (HCC)    Secondary diabetes (HCC)    does not check blood sugar   Thoracic ascending aortic aneurysm (HCC) 04/29/2020   a.) CT chest 04/29/2020: 4.7 cm; b.) CT CAP 08/04/2021: 4.6 cm; c.) CT chest 10/08/2021: 4.5 cm; d.) CT chest 02/25/2022: 4.6 cm; e.) CT chest 10/07/2022: 4.5 cm   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     Family History  Problem Relation Age of Onset   Stroke Mother    Arthritis Father    Heart disease Father     Brain cancer Brother        1/2 brother (possibly related to mother NOT related to patient)   Cancer Cousin        unk type   Lymphoma Cousin        vs leukemia   Colon cancer Neg Hx    Esophageal cancer Neg Hx    Pancreatic cancer Neg Hx    Stomach cancer Neg Hx    Liver disease Neg Hx      Past Surgical History:  Procedure Laterality Date   BRONCHIAL BRUSHINGS  11/01/2022   Procedure: BRONCHIAL BRUSHINGS;  Surgeon: Raechel Chute, MD;  Location: [facility];  Service: Pulmonary;;   BRONCHIAL NEEDLE ASPIRATION BIOPSY  11/01/2022   Procedure: BRONCHIAL NEEDLE ASPIRATION BIOPSIES;  Surgeon: Raechel Chute, MD;  Location: [facility];  Service: Pulmonary;;   BRONCHIAL WASHINGS  11/01/2022   Procedure: BRONCHIAL WASHINGS;  Surgeon: Raechel Chute, MD;  Location: [facility];  Service: Pulmonary;;   CATARACT EXTRACTION Right    CATARACT EXTRACTION W/PHACO Left 12/26/2017   Procedure: CATARACT EXTRACTION PHACO AND INTRAOCULAR LENS PLACEMENT (IOC)  COMPLICATED LEFT;  Surgeon: Lockie Mola, MD;  Location: [facility];  Service: Ophthalmology;  Laterality: Left;  MALYUGIN VISION BLUE   CHOLECYSTECTOMY  03/01/2023   at [facility]   colonoscopy     ESOPHAGOGASTRODUODENOSCOPY (EGD) WITH PROPOFOL     EYE MUSCLE SURGERY Left    IR KYPHO THORACIC WITH BONE BIOPSY  11/11/2021   LAPAROSCOPIC SPLENECTOMY  02/25/2021   PORTA CATH INSERTION N/A 10/19/2020   Procedure: PORTA CATH INSERTION;  Surgeon: Annice Needy, MD;  Location: [facility];  Service: Cardiovascular;  Laterality: N/A;   TONSILLECTOMY AND ADENOIDECTOMY     XI ROBOTIC ASSISTED LAPAROSCOPIC DISTAL PANCREATECTOMY  02/25/2021    Social History   Socioeconomic History   Marital status: Married    Spouse name: Eloise   Number of children: 4   Years of education: Not on file   Highest education level: Not on file  Occupational History   Occupation: retired  Tobacco Use   Smoking status: Never   Smokeless  tobacco: Never  Vaping Use   Vaping status: Never Used  Substance and Sexual Activity   Alcohol use: No   Drug use: No   Sexual activity: Yes    Partners: Female  Other Topics Concern   Not on file  Social History Narrative   Lives with wife in a 5 story retirement home.  He lives on the first floor.  4 year college degree.  Retired Corporate investment banker.  Married 4 years to his first wife's sister.     Social Determinants of Health   Financial Resource Strain: Low Risk  (04/13/2020)   Overall Financial Resource Strain (CARDIA)    Difficulty of Paying Living Expenses: Not hard at all  Food Insecurity: No Food Insecurity (01/13/2023)   Hunger Vital Sign    Worried About Running Out of Food in the Last Year: Never true    Ran Out of Food in the Last Year: Never true  Transportation Needs: No Transportation Needs (01/13/2023)   PRAPARE - Administrator, Civil Service (Medical): No    Lack of Transportation (Non-Medical): No  Physical Activity: Sufficiently Active (04/10/2019)   Exercise Vital Sign    Days of Exercise per Week: 5 days    Minutes of Exercise per Session: 30 min  Stress: No Stress Concern Present (04/13/2020)   [employer] of Occupational Health - Occupational Stress Questionnaire    Feeling of Stress : Not at all  Social Connections: Unknown (04/13/2020)   Social Connection and Isolation Panel [NHANES]    Frequency of Communication with Friends and Family: Not on file    Frequency of Social Gatherings with Friends and Family: Not on file    Attends Religious Services: Not on file    Active Member of Clubs or Organizations: Not on file    Attends Club or Organization Meetings: Not on file    Marital Status: Married  Intimate Partner Violence: Not At Risk (01/10/2023)   Humiliation, Afraid, Rape, and Kick questionnaire    Fear of Current or Ex-Partner: No    Emotionally Abused: No    Physically Abused: No    Sexually Abused: No     Allergies  Allergen  Reactions   Kenalog [Triamcinolone Acetonide]     Blindness X 3 Days injection   Tramadol Hcl     Dizzy and Weakness     CBC    Component Value Date/Time   WBC 6.2 04/18/2023 0724   RBC 4.28 04/18/2023 0724   HGB 13.6 04/18/2023 0724   HGB 13.6 03/24/2021 1406   HCT 39.7 04/18/2023 0724   HCT 41.4 03/24/2021 1406   PLT 236 04/18/2023 0724   PLT 324 03/24/2021 1406   MCV 92.8 04/18/2023 0724   MCV 93 03/24/2021 1406   MCV 91 05/29/2014 0917   MCH 31.8 04/18/2023 0724   MCHC 34.3 04/18/2023 0724   RDW 14.1 04/18/2023 0724   RDW 12.4 03/24/2021 1406   RDW 13.4 05/29/2014 0917   LYMPHSABS 2.9 01/19/2023 1126   LYMPHSABS 1.2 05/29/2014 0917   MONOABS 1.1 (H) 01/19/2023 1126   MONOABS 0.3 05/29/2014 0917   EOSABS 0.1 01/19/2023 1126   EOSABS 0.1 05/29/2014 0917   BASOSABS 0.1 01/19/2023 1126   BASOSABS 0.0 05/29/2014 1610    Pulmonary Functions Testing Results:     No data to display          @ENCMEDSTART @

## 2023-04-18 NOTE — Anesthesia Postprocedure Evaluation (Signed)
Anesthesia Post Note  Patient: George Owens  Procedure(s) Performed: ROBOTIC ASSISTED NAVIGATIONAL BRONCHOSCOPY (Bilateral) BRONCHIAL NEEDLE ASPIRATION BIOPSIES     Patient location during evaluation: PACU Anesthesia Type: General Level of consciousness: awake and alert Pain management: pain level controlled Vital Signs Assessment: post-procedure vital signs reviewed and stable Respiratory status: spontaneous breathing, nonlabored ventilation and respiratory function stable Cardiovascular status: stable and blood pressure returned to baseline Anesthetic complications: yes   Encounter Notable Events  Notable Event Outcome Phase Comment  Difficult to intubate - unexpected  Intraprocedure Filed from anesthesia note documentation.    Last Vitals:  Vitals:   04/18/23 0945 04/18/23 1000  BP: 129/76 136/76  Pulse: (!) 59 62  Resp: 15 17  Temp:  36.5 C  SpO2: 96% 93%    Last Pain:  Vitals:   04/18/23 0945  TempSrc:   PainSc: 0-No pain                 Beryle Lathe

## 2023-04-20 ENCOUNTER — Other Ambulatory Visit: Payer: Self-pay | Admitting: Oncology

## 2023-04-20 LAB — CYTOLOGY - NON PAP

## 2023-04-21 ENCOUNTER — Inpatient Hospital Stay: Payer: PPO

## 2023-04-21 ENCOUNTER — Inpatient Hospital Stay: Payer: PPO | Admitting: Oncology

## 2023-04-21 ENCOUNTER — Telehealth: Payer: Self-pay

## 2023-04-21 NOTE — Telephone Encounter (Signed)
Called to check the status of the creon application and they state they have receive all the information and it should be processed by the end of the day and we will receive a fax and the patient will get notify also

## 2023-04-24 ENCOUNTER — Inpatient Hospital Stay: Payer: PPO | Attending: Oncology

## 2023-04-24 ENCOUNTER — Encounter: Payer: Self-pay | Admitting: Oncology

## 2023-04-24 ENCOUNTER — Telehealth: Payer: Self-pay | Admitting: *Deleted

## 2023-04-24 ENCOUNTER — Inpatient Hospital Stay: Payer: PPO

## 2023-04-24 ENCOUNTER — Inpatient Hospital Stay: Payer: PPO | Admitting: Oncology

## 2023-04-24 VITALS — BP 110/69 | HR 68 | Temp 95.0°F | Resp 18 | Ht 75.0 in | Wt 177.0 lb

## 2023-04-24 DIAGNOSIS — C251 Malignant neoplasm of body of pancreas: Secondary | ICD-10-CM

## 2023-04-24 DIAGNOSIS — C7802 Secondary malignant neoplasm of left lung: Secondary | ICD-10-CM | POA: Insufficient documentation

## 2023-04-24 DIAGNOSIS — R918 Other nonspecific abnormal finding of lung field: Secondary | ICD-10-CM

## 2023-04-24 DIAGNOSIS — Z08 Encounter for follow-up examination after completed treatment for malignant neoplasm: Secondary | ICD-10-CM

## 2023-04-24 DIAGNOSIS — C7801 Secondary malignant neoplasm of right lung: Secondary | ICD-10-CM | POA: Insufficient documentation

## 2023-04-24 LAB — CBC WITH DIFFERENTIAL/PLATELET
Abs Immature Granulocytes: 0.02 10*3/uL (ref 0.00–0.07)
Basophils Absolute: 0 10*3/uL (ref 0.0–0.1)
Basophils Relative: 1 %
Eosinophils Absolute: 0.1 10*3/uL (ref 0.0–0.5)
Eosinophils Relative: 1 %
HCT: 40.2 % (ref 39.0–52.0)
Hemoglobin: 13.7 g/dL (ref 13.0–17.0)
Immature Granulocytes: 0 %
Lymphocytes Relative: 39 %
Lymphs Abs: 2.6 10*3/uL (ref 0.7–4.0)
MCH: 31.6 pg (ref 26.0–34.0)
MCHC: 34.1 g/dL (ref 30.0–36.0)
MCV: 92.8 fL (ref 80.0–100.0)
Monocytes Absolute: 0.8 10*3/uL (ref 0.1–1.0)
Monocytes Relative: 12 %
Neutro Abs: 3.1 10*3/uL (ref 1.7–7.7)
Neutrophils Relative %: 47 %
Platelets: 248 10*3/uL (ref 150–400)
RBC: 4.33 MIL/uL (ref 4.22–5.81)
RDW: 13.9 % (ref 11.5–15.5)
WBC: 6.6 10*3/uL (ref 4.0–10.5)
nRBC: 0 % (ref 0.0–0.2)

## 2023-04-24 LAB — COMPREHENSIVE METABOLIC PANEL
ALT: 37 U/L (ref 0–44)
AST: 35 U/L (ref 15–41)
Albumin: 4 g/dL (ref 3.5–5.0)
Alkaline Phosphatase: 59 U/L (ref 38–126)
Anion gap: 8 (ref 5–15)
BUN: 27 mg/dL — ABNORMAL HIGH (ref 8–23)
CO2: 28 mmol/L (ref 22–32)
Calcium: 8.8 mg/dL — ABNORMAL LOW (ref 8.9–10.3)
Chloride: 100 mmol/L (ref 98–111)
Creatinine, Ser: 0.83 mg/dL (ref 0.61–1.24)
GFR, Estimated: >60 mL/min (ref >60)
Glucose, Bld: 143 mg/dL — ABNORMAL HIGH (ref 70–99)
Potassium: 3.7 mmol/L (ref 3.5–5.1)
Sodium: 136 mmol/L (ref 135–145)
Total Bilirubin: 0.7 mg/dL (ref <1.2)
Total Protein: 6.9 g/dL (ref 6.5–8.1)

## 2023-04-24 NOTE — Progress Notes (Signed)
Hematology/Oncology Consult note [facility]  [phone] [phone]  Patient Care Team: [name], MD as PCP - General (Internal Medicine) [name], RN as Oncology Nurse Navigator [name], MD as Consulting Physician (Oncology)   Name of the patient: George Owens  [id number]  Oct 08, 1931   Date of visit: 04/24/23  Diagnosis- history of pancreatic cancer s/p resection now concerning for lung metastases   Chief complaint/ Reason for visit-discuss bronchoscopy results and further management  Heme/Onc history: patient is a 89 year old male with a past medical history significant for GERD, hyperlipidemia, cirrhosis.  He was having symptoms of Abdominal pain and reflux and therefore underwent a CT abdomen and pelvis with contrast on 09/11/2020 which showed a hypointense ill-defined mass in the pancreatic body up to 25 mm.  No extrapancreatic infiltrative density about the aorta or proximal [name].  No local regional adenopathy or distant metastatic disease.  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  This was followed by an EUS at [facility] which showed a hypoechoic mass measuring 1.8 x 1.6 cm in the pancreatic body.  Endosonographic borders poorly defined.  Upstream pancreatic duct dilatation.  No abnormal appearing lymph nodes.  Endosonographic imaging of the liver showed no lesion.  Biopsy showed adenocarcinoma.   PET scan showed 2.7 x 2 cm ovoid mass in the body of the pancreas with an SUV of 7.1.  18 mm hypermetabolic nodule in the isthmus of the thyroid gland.  No evidence of local regional adenopathy or distant metastatic disease.   Patient was evaluated by Dr. [name] at [facility] for consideration of surgery and has been deemed to be upfront resectable surgical candidate.  However plan was to offer him neoadjuvant chemotherapy for 3 months to control micrometastatic disease before proceeding with definitive surgery.  If ultimately  patient decides not to proceed with surgery then radiation at [facility] also remains an option.   Patient received 8 doses of gemcitabine and Abraxane chemotherapy 1 week on 1 week off due to neutropenia and thrombocytopenia. Patient underwent laparoscopy surgery by Dr. [name] at [facility].  Final pathology showed 3 cm grade 3 poorly differentiated adenocarcinoma of the pancreas with negative margins.  15 lymph nodes negative for malignancy.  Absent chemotherapy response with extensive residual cancer and no evidence of tumor regression.  Perineural invasion identified lymphovascular invasion not identified.  Patient completed 4 months of adjuvant gemcitabine Abraxane chemotherapy in January 2023.   Interval history-patient lost 3 pounds in the last couple of weeks and had some symptoms of diarrhea.  He restarted his Creon after discussion with GI.  Otherwise he is doing well overall.  Denies any nausea vomiting or abdominal pain.  ECOG PS- 1 Pain scale- 0   Review of systems- Review of Systems  Constitutional:  Positive for weight loss. Negative for chills, fever and malaise/fatigue.  HENT:  Negative for congestion, ear discharge and nosebleeds.   Eyes:  Negative for blurred vision.  Respiratory:  Negative for cough, hemoptysis, sputum production, shortness of breath and wheezing.   Cardiovascular:  Negative for chest pain, palpitations, orthopnea and claudication.  Gastrointestinal:  Positive for diarrhea. Negative for abdominal pain, blood in stool, constipation, heartburn, melena, nausea and vomiting.  Genitourinary:  Negative for dysuria, flank pain, frequency, hematuria and urgency.  Musculoskeletal:  Negative for back pain, joint pain and myalgias.  Skin:  Negative for rash.  Neurological:  Negative for dizziness, tingling, focal weakness, seizures, weakness and headaches.  Endo/Heme/Allergies:  Does  not bruise/bleed easily.  Psychiatric/Behavioral:  Negative for depression and suicidal ideas.  The patient does not have insomnia.       Allergies  Allergen Reactions   Kenalog [Triamcinolone Acetonide]     Blindness X 3 Days injection   Tramadol Hcl     Dizzy and Weakness     Past Medical History:  Diagnosis Date   Adenocarcinoma of pancreas (HCC)    a.) stage 1B poorly differentiated adenocarcinoma of the pancreas (grade 3) s/p distal pancreatectomy and splenectomy (negative margins); 15 lymph nodes negative for malignancy   Allergy    Aortic atherosclerosis (HCC)    Basal cell carcinoma    right temple 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   Colon polyps    Coronary artery calcification seen on CT scan    COVID-19 virus infection 03/16/2020   Diastolic dysfunction 10/09/2020   a.) TTE 10/09/2020: EF 60-65%, no RWMAs, normal RVSF, Ao root 39 mm, G1DD   GERD (gastroesophageal reflux disease)    Hemorrhoids    Hepatic steatosis    Hyperlipidemia    Hypertension    Leg cramps    Lung nodules 10/07/2021   Neuropathy    Osteoporosis    a.) on denosumab injections   Paroxysmal A-fib (HCC) 02/25/2021   a.) developed postop following distal pancreatectomy and splenectomy; b.) CHA2DS2-VASc = 4 (age x2, HTN, vascular disease history); b.) cardiac rate/rhythm maintained on oral metoprolol succinate; no chronic OAC   Pneumonia 2022   Prostate cancer (HCC) 2006   Renal artery aneurysm (HCC)    Secondary diabetes (HCC)    does not check blood sugar   Thoracic ascending aortic aneurysm (HCC) 04/29/2020   a.) CT chest 04/29/2020: 4.7 cm; b.) CT CAP 08/04/2021: 4.6 cm; c.) CT chest 10/08/2021: 4.5 cm; d.) CT chest 02/25/2022: 4.6 cm; e.) CT chest 10/07/2022: 4.5 cm   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     Past Surgical History:  Procedure Laterality Date   BRONCHIAL BRUSHINGS  11/01/2022   Procedure: BRONCHIAL BRUSHINGS;  Surgeon: Raechel Chute, MD;  Location: [facility];   Service: Pulmonary;;   BRONCHIAL NEEDLE ASPIRATION BIOPSY  11/01/2022   Procedure: BRONCHIAL NEEDLE ASPIRATION BIOPSIES;  Surgeon: Raechel Chute, MD;  Location: [facility];  Service: Pulmonary;;   BRONCHIAL NEEDLE ASPIRATION BIOPSY  04/18/2023   Procedure: BRONCHIAL NEEDLE ASPIRATION BIOPSIES;  Surgeon: Raechel Chute, MD;  Location: [facility];  Service: Pulmonary;;   BRONCHIAL WASHINGS  11/01/2022   Procedure: BRONCHIAL WASHINGS;  Surgeon: Raechel Chute, MD;  Location: [facility];  Service: Pulmonary;;   CATARACT EXTRACTION Right    CATARACT EXTRACTION W/PHACO Left 12/26/2017   Procedure: CATARACT EXTRACTION PHACO AND INTRAOCULAR LENS PLACEMENT (IOC)  COMPLICATED LEFT;  Surgeon: Lockie Mola, MD;  Location: [facility];  Service: Ophthalmology;  Laterality: Left;  MALYUGIN VISION BLUE   CHOLECYSTECTOMY  03/01/2023   at [facility]   colonoscopy     ESOPHAGOGASTRODUODENOSCOPY (EGD) WITH PROPOFOL     EYE MUSCLE SURGERY Left    IR KYPHO THORACIC WITH BONE BIOPSY  11/11/2021   LAPAROSCOPIC SPLENECTOMY  02/25/2021   PORTA CATH INSERTION N/A 10/19/2020   Procedure: PORTA CATH INSERTION;  Surgeon: Annice Needy, MD;  Location: [facility];  Service: Cardiovascular;  Laterality: N/A;   TONSILLECTOMY AND ADENOIDECTOMY     XI ROBOTIC ASSISTED LAPAROSCOPIC DISTAL PANCREATECTOMY  02/25/2021  Social History   Socioeconomic History   Marital status: Married    Spouse name: Eloise   Number of children: 4   Years of education: Not on file   Highest education level: Not on file  Occupational History   Occupation: retired  Tobacco Use   Smoking status: Never   Smokeless tobacco: Never  Vaping Use   Vaping status: Never Used  Substance and Sexual Activity   Alcohol use: No   Drug use: No   Sexual activity: Yes    Partners: Female  Other Topics Concern   Not on file  Social History Narrative   Lives with wife in a 5 story retirement home.  He lives on the first  floor.  4 year college degree.  Retired Corporate investment banker.  Married 4 years to his first wife's sister.     Social Determinants of Health   Financial Resource Strain: Low Risk  (04/13/2020)   Overall Financial Resource Strain (CARDIA)    Difficulty of Paying Living Expenses: Not hard at all  Food Insecurity: No Food Insecurity (01/13/2023)   Hunger Vital Sign    Worried About Running Out of Food in the Last Year: Never true    Ran Out of Food in the Last Year: Never true  Transportation Needs: No Transportation Needs (01/13/2023)   PRAPARE - Administrator, Civil Service (Medical): No    Lack of Transportation (Non-Medical): No  Physical Activity: Sufficiently Active (04/10/2019)   Exercise Vital Sign    Days of Exercise per Week: 5 days    Minutes of Exercise per Session: 30 min  Stress: No Stress Concern Present (04/13/2020)   [employer] of Occupational Health - Occupational Stress Questionnaire    Feeling of Stress : Not at all  Social Connections: Unknown (04/13/2020)   Social Connection and Isolation Panel [NHANES]    Frequency of Communication with Friends and Family: Not on file    Frequency of Social Gatherings with Friends and Family: Not on file    Attends Religious Services: Not on file    Active Member of Clubs or Organizations: Not on file    Attends Banker Meetings: Not on file    Marital Status: Married  Intimate Partner Violence: Not At Risk (01/10/2023)   Humiliation, Afraid, Rape, and Kick questionnaire    Fear of Current or Ex-Partner: No    Emotionally Abused: No    Physically Abused: No    Sexually Abused: No    Family History  Problem Relation Age of Onset   Stroke Mother    Arthritis Father    Heart disease Father    Brain cancer Brother        1/2 brother (possibly related to mother NOT related to patient)   Cancer Cousin        unk type   Lymphoma Cousin        vs leukemia   Colon cancer Neg Hx    Esophageal cancer Neg  Hx    Pancreatic cancer Neg Hx    Stomach cancer Neg Hx    Liver disease Neg Hx      Current Outpatient Medications:    metoprolol succinate (TOPROL-XL) 50 MG 24 hr tablet, Take 75 mg by mouth daily., Disp: , Rfl:    acetaminophen (TYLENOL) 500 MG tablet, Take 1,000 mg by mouth every 6 (six) hours as needed for moderate pain., Disp: , Rfl:    Blood Glucose Monitoring Suppl (ONETOUCH VERIO) w/Device KIT,  Please use to check blood sugars once daily. Dx code: E11.65 (Patient not taking: Reported on 02/23/2023), Disp: 1 kit, Rfl: 0   Blood Glucose Monitoring Suppl DEVI, 1 each by Does not apply route daily. May substitute to any manufacturer covered by patient's insurance. (Patient not taking: Reported on 02/23/2023), Disp: 1 each, Rfl: 0   Calcium Carb-Cholecalciferol (CALCIUM 600 + D PO), Take 1 tablet by mouth in the morning., Disp: , Rfl:    cholecalciferol (VITAMIN D3) 25 MCG (1000 UNIT) tablet, Take 1,000 Units by mouth in the morning., Disp: , Rfl:    Cranberry 500 MG CAPS, Take 500 mg by mouth in the morning., Disp: , Rfl:    denosumab (PROLIA) 60 MG/ML SOSY injection, Inject 60 mg into the skin every 6 (six) months., Disp: 1 mL, Rfl: 1   Glucose Blood (BLOOD GLUCOSE TEST STRIPS) STRP, May substitute to any manufacturer covered by patient's insurance.  Use to  check blood sugar once daily at variable times (Patient not taking: Reported on 02/23/2023), Disp: 100 strip, Rfl: 0   glucose blood (ONETOUCH VERIO) test strip, Use to check blood sugars one time daily. Dx code: E11.65, Disp: 100 each, Rfl: 2   Lancets (ONETOUCH ULTRASOFT) lancets, Use to check blood sugars once daily. Dx code: E11.65, Disp: 100 each, Rfl: 2   lipase/protease/amylase (CREON) 36000 UNITS CPEP capsule, Take 2 capsules with the first bite of each meal and 1 capsule with the first bite of each snack, Disp: 240 capsule, Rfl: 5   loratadine (CLARITIN) 10 MG tablet, Take 10 mg by mouth daily as needed for allergies., Disp: , Rfl:     metoprolol succinate (TOPROL-XL) 25 MG 24 hr tablet, Take 25 mg by mouth in the morning., Disp: , Rfl:    Misc Natural Products (JOINT HEALTH PO), Take 1 tablet by mouth every evening., Disp: , Rfl:    naproxen sodium (ALEVE) 220 MG tablet, Take 220 mg by mouth daily as needed., Disp: , Rfl:    omeprazole (PRILOSEC) 40 MG capsule, TAKE 1 CAPSULE (40 MG TOTAL) BY MOUTH DAILY., Disp: 30 capsule, Rfl: 0   Polyethyl Glycol-Propyl Glycol (SYSTANE OP), Place 1 drop into both eyes 2 (two) times daily as needed (dry/irritated eyes.)., Disp: , Rfl:    tamsulosin (FLOMAX) 0.4 MG CAPS capsule, TAKE 1 CAPSULE BY MOUTH EVERY DAY, Disp: 90 capsule, Rfl: 1   traMADol (ULTRAM) 50 MG tablet, Take 1 tablet (50 mg total) by mouth every 6 (six) hours as needed., Disp: 20 tablet, Rfl: 0   vitamin B-12 (CYANOCOBALAMIN) 1000 MCG tablet, Take 1,000 mcg by mouth every evening., Disp: , Rfl:  No current facility-administered medications for this visit.  Facility-Administered Medications Ordered in Other Visits:    heparin lock flush 100 UNIT/ML injection, , , ,    heparin lock flush 100 unit/mL, 500 Units, Intravenous, Once, [name], MD  Physical exam:  Vitals:   04/24/23 1020  BP: 110/69  Pulse: 68  Resp: 18  Temp: (!) 95 F (35 C)  TempSrc: Tympanic  SpO2: 100%  Weight: 177 lb (80.3 kg)  Height: 6\' 3"  (1.905 m)   Physical Exam Cardiovascular:     Rate and Rhythm: Normal rate and regular rhythm.     Heart sounds: Normal heart sounds.  Pulmonary:     Effort: Pulmonary effort is normal.     Breath sounds: Normal breath sounds.  Skin:    General: Skin is warm and dry.  Neurological:  Mental Status: He is alert and oriented to person, place, and time.         Latest Ref Rng & Units 04/24/2023    9:59 AM  CMP  Glucose 70 - 99 mg/dL 161   BUN 8 - 23 mg/dL 27   Creatinine 0.96 - 1.24 mg/dL 0.45   Sodium 409 - 811 mmol/L 136   Potassium 3.5 - 5.1 mmol/L 3.7   Chloride 98 - 111 mmol/L  100   CO2 22 - 32 mmol/L 28   Calcium 8.9 - 10.3 mg/dL 8.8   Total Protein 6.5 - 8.1 g/dL 6.9   Total Bilirubin <9.1 mg/dL 0.7   Alkaline Phos 38 - 126 U/L 59   AST 15 - 41 U/L 35   ALT 0 - 44 U/L 37       Latest Ref Rng & Units 04/24/2023    9:59 AM  CBC  WBC 4.0 - 10.5 K/uL 6.6   Hemoglobin 13.0 - 17.0 g/dL 47.8   Hematocrit 29.5 - 52.0 % 40.2   Platelets 150 - 400 K/uL 248     No images are attached to the encounter.  DG Chest Port 1 View  Result Date: 04/18/2023 CLINICAL DATA:  89 year old male status post bronchoscopy. EXAM: PORTABLE CHEST 1 VIEW COMPARISON:  Chest CT yesterday, and earlier. FINDINGS: Portable AP upright view at 0918 hours. Stable right chest Port-A-Cath. Lower lung volumes compared to July. Stable cardiac size and mediastinal contours. Visualized tracheal air column is within normal limits. No pneumothorax, pulmonary edema, pleural effusion or consolidation. Multinodular abnormal lung opacity demonstrated by CT is subtle radiographically, 1 left lower lung nodule is visible just beneath the left lateral 7th rib. No acute osseous abnormality identified. Negative visible bowel gas. IMPRESSION: 1. No pneumothorax or other adverse features following bronchoscopy. 2. Multinodular abnormal lung opacities demonstrated by CT are subtle by radiograph. Electronically Signed   By: Odessa Fleming M.D.   On: 04/18/2023 09:51   DG C-ARM BRONCHOSCOPY  Result Date: 04/18/2023 C-ARM BRONCHOSCOPY: Fluoroscopy was utilized by the requesting physician.  No radiographic interpretation.     Assessment and plan- Patient is a 89 y.o. male with history of stage I pancreatic cancer s/p resection and adjuvant chemotherapy ending in January 2023.  He now has bilateral lung nodules concerning for metastatic disease  Discussed with the patient that he has had subcentimeter lung Nodules at least dating back to April 2023 when they were first noticed and since then they have been gradually growing  over the last year and a half.  PET scan in August 2024 again showed that these nodules were ranging between 6 mm to 16 mm but there was no evidence of metastatic disease elsewhere.  He was seen by Dr. Aundria Rud and underwent bronchoscopy guided biopsies.  Cytology unfortunately shows atypical cells but was not diagnostic for malignancy  Given the continuous growth of the lung nodules over the last 2 years this is highly concerning for metastatic pancreatic cancer in the absence of other plausible diagnosis.  However we do not have a tissue diagnosis to confirm this.  CA 19-9 from today is pending.  Based on that we will decide if he wants to.  We could consider getting a repeat PET scan to see if there is anything else that is amenable to biopsy.  If there are no actionable biopsy areas we could consider treating this empirically as metastatic pancreatic cancer with FOLFIRI chemotherapy.  Discussed with the patient that chemotherapy  and stage IV pancreatic cancer improved life expectancy by months but not years.  Despite not receiving any treatment for the last 2 years he has had a slow increase in the size of lung nodules but no evidence of disease elsewhere and patient has had a good quality of life so far without chemotherapy   Visit Diagnosis 1. Malignant neoplasm of body of pancreas (HCC)   2. Lung nodules      Dr. Owens Shark, MD, MPH [facility] at [facility] [phone] 04/24/2023 12:20 PM

## 2023-04-24 NOTE — Telephone Encounter (Signed)
Called to ask if he can get RSV vaccine and Smith Robert says yes and I called to tell him he can take it .

## 2023-04-24 NOTE — Telephone Encounter (Signed)
Called and she states she does not know why the lady would say that on Friday that a decision would be made that day. She said a decision would definitely be made this week

## 2023-04-25 ENCOUNTER — Encounter: Payer: Self-pay | Admitting: Oncology

## 2023-04-25 ENCOUNTER — Other Ambulatory Visit: Payer: Self-pay | Admitting: *Deleted

## 2023-04-25 DIAGNOSIS — C251 Malignant neoplasm of body of pancreas: Secondary | ICD-10-CM

## 2023-04-25 DIAGNOSIS — R918 Other nonspecific abnormal finding of lung field: Secondary | ICD-10-CM

## 2023-04-25 LAB — CANCER ANTIGEN 19-9: CA 19-9: 31 U/mL (ref 0–35)

## 2023-04-26 DIAGNOSIS — I480 Paroxysmal atrial fibrillation: Secondary | ICD-10-CM | POA: Diagnosis not present

## 2023-04-26 DIAGNOSIS — E785 Hyperlipidemia, unspecified: Secondary | ICD-10-CM | POA: Diagnosis not present

## 2023-04-26 DIAGNOSIS — C251 Malignant neoplasm of body of pancreas: Secondary | ICD-10-CM | POA: Diagnosis not present

## 2023-04-26 DIAGNOSIS — I7121 Aneurysm of the ascending aorta, without rupture: Secondary | ICD-10-CM | POA: Diagnosis not present

## 2023-04-26 NOTE — Telephone Encounter (Signed)
I did speak to him and plan is pet followed by chemo

## 2023-04-27 NOTE — Telephone Encounter (Signed)
Received a fax this morning that [employer] patient assistance is waiting on missing information from the patient. They have the requested the information directly from the patient. Once we receive these documents we will continue our evaluation. Called patient patient states they have not reach out to him stating they needed any information to complete the application form. Gave him the number to call to find out what they needed and he states he will call them right now.

## 2023-05-01 NOTE — Telephone Encounter (Signed)
Called to check on the status of the application and they received the financial portion they were needing and they will send it over for processing. Pam states we should have a answer by the end of the day today and should receive a fax

## 2023-05-02 ENCOUNTER — Ambulatory Visit
Admission: RE | Admit: 2023-05-02 | Discharge: 2023-05-02 | Disposition: A | Discharge status: Home / Self Care | Payer: PPO | Source: Ambulatory Visit | Attending: Oncology | Admitting: Oncology

## 2023-05-02 DIAGNOSIS — C251 Malignant neoplasm of body of pancreas: Secondary | ICD-10-CM | POA: Insufficient documentation

## 2023-05-02 DIAGNOSIS — R918 Other nonspecific abnormal finding of lung field: Secondary | ICD-10-CM | POA: Insufficient documentation

## 2023-05-02 DIAGNOSIS — E041 Nontoxic single thyroid nodule: Secondary | ICD-10-CM | POA: Diagnosis not present

## 2023-05-02 DIAGNOSIS — C259 Malignant neoplasm of pancreas, unspecified: Secondary | ICD-10-CM | POA: Diagnosis not present

## 2023-05-02 LAB — GLUCOSE, CAPILLARY: Glucose-Capillary: 107 mg/dL — ABNORMAL HIGH (ref 70–99)

## 2023-05-02 MED ORDER — FLUDEOXYGLUCOSE F - 18 (FDG) INJECTION
9.2000 | Freq: Once | INTRAVENOUS | Status: AC | PRN
  Administered 2023-05-02: 9.96 via INTRAVENOUS

## 2023-05-02 NOTE — Telephone Encounter (Signed)
Called [employer] patient support and patient has been approved for Creon patient assistance through 06/19/2024.

## 2023-05-02 NOTE — Telephone Encounter (Signed)
Patient states he did get notice that the creon had been approved but his problem is that he only has enough medication for a couple more days and it takes around a week for the medication to get deliver in the mail. He states he will be out of medication by then. He is wondering if we have samples that he can come and pick up so he can make it through till his medication comes in the mail. Patient will come and pick up samples gave him 3 boxes

## 2023-05-06 ENCOUNTER — Other Ambulatory Visit: Payer: Self-pay | Admitting: Gastroenterology

## 2023-05-08 ENCOUNTER — Encounter: Payer: Self-pay | Admitting: Oncology

## 2023-05-08 NOTE — Telephone Encounter (Signed)
Last office visit 09/01/2021 Pancreatic insuffiencey  Last refill 04/12/2023 0 refills

## 2023-05-12 ENCOUNTER — Encounter: Payer: Self-pay | Admitting: Oncology

## 2023-05-12 ENCOUNTER — Other Ambulatory Visit: Payer: Self-pay

## 2023-05-12 ENCOUNTER — Inpatient Hospital Stay: Payer: PPO | Admitting: Oncology

## 2023-05-12 VITALS — BP 113/71 | HR 72 | Temp 97.0°F | Resp 18 | Ht 75.0 in | Wt 178.5 lb

## 2023-05-12 DIAGNOSIS — R918 Other nonspecific abnormal finding of lung field: Secondary | ICD-10-CM

## 2023-05-12 DIAGNOSIS — C251 Malignant neoplasm of body of pancreas: Secondary | ICD-10-CM | POA: Diagnosis not present

## 2023-05-12 DIAGNOSIS — Z7189 Other specified counseling: Secondary | ICD-10-CM

## 2023-05-12 NOTE — Progress Notes (Signed)
Hematology/Oncology Consult note [facility]  [phone] [phone]  Patient Care Team: [name], MD as PCP - General (Internal Medicine) [name], RN as Oncology Nurse Navigator [name], MD as Consulting Physician (Oncology)   Name of the patient: George Owens  [id number]  04-14-1932   Date of visit: 05/12/23  Diagnosis-history of pancreatic cancer now with concerns for lung metastases  Chief complaint/ Reason for visit-discuss further management of bilateral lung nodules and pancreatic cancer  Heme/Onc history:  patient is a 89 year old male with a past medical history significant for GERD, hyperlipidemia, cirrhosis.  He was having symptoms of Abdominal pain and reflux and therefore underwent a CT abdomen and pelvis with contrast on 09/11/2020 which showed a hypointense ill-defined mass in the pancreatic body up to 25 mm.  No extrapancreatic infiltrative density about the aorta or proximal [name].  No local regional adenopathy or distant metastatic disease.  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  This was followed by an EUS at [facility] which showed a hypoechoic mass measuring 1.8 x 1.6 cm in the pancreatic body.  Endosonographic borders poorly defined.  Upstream pancreatic duct dilatation.  No abnormal appearing lymph nodes.  Endosonographic imaging of the liver showed no lesion.  Biopsy showed adenocarcinoma.   PET scan showed 2.7 x 2 cm ovoid mass in the body of the pancreas with an SUV of 7.1.  18 mm hypermetabolic nodule in the isthmus of the thyroid gland.  No evidence of local regional adenopathy or distant metastatic disease.   Patient was evaluated by Dr. [name] at [facility] for consideration of surgery and has been deemed to be upfront resectable surgical candidate.  However plan was to offer him neoadjuvant chemotherapy for 3 months to control micrometastatic disease before proceeding with definitive surgery.  If  ultimately patient decides not to proceed with surgery then radiation at [facility] also remains an option.   Patient received 8 doses of gemcitabine and Abraxane chemotherapy 1 week on 1 week off due to neutropenia and thrombocytopenia. Patient underwent laparoscopy surgery by Dr. [name] at [facility].  Final pathology showed 3 cm grade 3 poorly differentiated adenocarcinoma of the pancreas with negative margins.  15 lymph nodes negative for malignancy.  Absent chemotherapy response with extensive residual cancer and no evidence of tumor regression.  Perineural invasion identified lymphovascular invasion not identified.  Patient completed 4 months of adjuvant gemcitabine Abraxane chemotherapy in January 2023.   Patient noted to Have bilateral lung nodules that were subcentimeter dating back to April 2023 and since then they have gradually grown in size.  He had bronchoscopy and attempts to biopsy twice in May 2024 and October 2024 and results have been negative for malignancy.  Interval history-patient has lost 2 pounds over the last 3 months.  Occasional diarrhea.  Denies any nausea vomiting or abdominal pain  ECOG PS- 1 Pain scale- 0   Review of systems- Review of Systems  Constitutional:  Positive for malaise/fatigue and weight loss. Negative for chills and fever.  HENT:  Negative for congestion, ear discharge and nosebleeds.   Eyes:  Negative for blurred vision.  Respiratory:  Negative for cough, hemoptysis, sputum production, shortness of breath and wheezing.   Cardiovascular:  Negative for chest pain, palpitations, orthopnea and claudication.  Gastrointestinal:  Negative for abdominal pain, blood in stool, constipation, diarrhea, heartburn, melena, nausea and vomiting.  Genitourinary:  Negative for dysuria, flank pain, frequency, hematuria and urgency.  Musculoskeletal:  Negative for  back pain, joint pain and myalgias.  Skin:  Negative for rash.  Neurological:  Negative for dizziness, tingling,  focal weakness, seizures, weakness and headaches.  Endo/Heme/Allergies:  Does not bruise/bleed easily.  Psychiatric/Behavioral:  Negative for depression and suicidal ideas. The patient does not have insomnia.       Allergies  Allergen Reactions   Kenalog [Triamcinolone Acetonide]     Blindness X 3 Days injection   Tramadol Hcl     Dizzy and Weakness     Past Medical History:  Diagnosis Date   Adenocarcinoma of pancreas (HCC)    a.) stage 1B poorly differentiated adenocarcinoma of the pancreas (grade 3) s/p distal pancreatectomy and splenectomy (negative margins); 15 lymph nodes negative for malignancy   Allergy    Aortic atherosclerosis (HCC)    Basal cell carcinoma    right temple 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   Colon polyps    Coronary artery calcification seen on CT scan    COVID-19 virus infection 03/16/2020   Diastolic dysfunction 10/09/2020   a.) TTE 10/09/2020: EF 60-65%, no RWMAs, normal RVSF, Ao root 39 mm, G1DD   GERD (gastroesophageal reflux disease)    Hemorrhoids    Hepatic steatosis    Hyperlipidemia    Hypertension    Leg cramps    Lung nodules 10/07/2021   Neuropathy    Osteoporosis    a.) on denosumab injections   Paroxysmal A-fib (HCC) 02/25/2021   a.) developed postop following distal pancreatectomy and splenectomy; b.) CHA2DS2-VASc = 4 (age x2, HTN, vascular disease history); b.) cardiac rate/rhythm maintained on oral metoprolol succinate; no chronic OAC   Pneumonia 2022   Prostate cancer (HCC) 2006   Renal artery aneurysm (HCC)    Secondary diabetes (HCC)    does not check blood sugar   Thoracic ascending aortic aneurysm (HCC) 04/29/2020   a.) CT chest 04/29/2020: 4.7 cm; b.) CT CAP 08/04/2021: 4.6 cm; c.) CT chest 10/08/2021: 4.5 cm; d.) CT chest 02/25/2022: 4.6 cm; e.) CT chest 10/07/2022: 4.5 cm   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     Past  Surgical History:  Procedure Laterality Date   BRONCHIAL BRUSHINGS  11/01/2022   Procedure: BRONCHIAL BRUSHINGS;  Surgeon: Raechel Chute, MD;  Location: [facility];  Service: Pulmonary;;   BRONCHIAL NEEDLE ASPIRATION BIOPSY  11/01/2022   Procedure: BRONCHIAL NEEDLE ASPIRATION BIOPSIES;  Surgeon: Raechel Chute, MD;  Location: [facility];  Service: Pulmonary;;   BRONCHIAL NEEDLE ASPIRATION BIOPSY  04/18/2023   Procedure: BRONCHIAL NEEDLE ASPIRATION BIOPSIES;  Surgeon: Raechel Chute, MD;  Location: [facility];  Service: Pulmonary;;   BRONCHIAL WASHINGS  11/01/2022   Procedure: BRONCHIAL WASHINGS;  Surgeon: Raechel Chute, MD;  Location: [facility];  Service: Pulmonary;;   CATARACT EXTRACTION Right    CATARACT EXTRACTION W/PHACO Left 12/26/2017   Procedure: CATARACT EXTRACTION PHACO AND INTRAOCULAR LENS PLACEMENT (IOC)  COMPLICATED LEFT;  Surgeon: Lockie Mola, MD;  Location: [facility];  Service: Ophthalmology;  Laterality: Left;  MALYUGIN VISION BLUE   CHOLECYSTECTOMY  03/01/2023   at [facility]   colonoscopy     ESOPHAGOGASTRODUODENOSCOPY (EGD) WITH PROPOFOL     EYE MUSCLE SURGERY Left    IR KYPHO THORACIC WITH BONE BIOPSY  11/11/2021   LAPAROSCOPIC SPLENECTOMY  02/25/2021   PORTA CATH INSERTION N/A 10/19/2020   Procedure: PORTA CATH INSERTION;  Surgeon: Annice Needy, MD;  Location: [facility]  INVASIVE CV LAB;  Service: Cardiovascular;  Laterality: N/A;   TONSILLECTOMY AND ADENOIDECTOMY     XI ROBOTIC ASSISTED LAPAROSCOPIC DISTAL PANCREATECTOMY  02/25/2021    Social History   Socioeconomic History   Marital status: Married    Spouse name: Eloise   Number of children: 4   Years of education: Not on file   Highest education level: Not on file  Occupational History   Occupation: retired  Tobacco Use   Smoking status: Never   Smokeless tobacco: Never  Vaping Use   Vaping status: Never Used  Substance and Sexual Activity   Alcohol use: No   Drug use: No   Sexual  activity: Yes    Partners: Female  Other Topics Concern   Not on file  Social History Narrative   Lives with wife in a 5 story retirement home.  He lives on the first floor.  4 year college degree.  Retired Corporate investment banker.  Married 4 years to his first wife's sister.     Social Determinants of Health   Financial Resource Strain: Low Risk  (04/13/2020)   Overall Financial Resource Strain (CARDIA)    Difficulty of Paying Living Expenses: Not hard at all  Food Insecurity: No Food Insecurity (01/13/2023)   Hunger Vital Sign    Worried About Running Out of Food in the Last Year: Never true    Ran Out of Food in the Last Year: Never true  Transportation Needs: No Transportation Needs (01/13/2023)   PRAPARE - Administrator, Civil Service (Medical): No    Lack of Transportation (Non-Medical): No  Physical Activity: Sufficiently Active (04/10/2019)   Exercise Vital Sign    Days of Exercise per Week: 5 days    Minutes of Exercise per Session: 30 min  Stress: No Stress Concern Present (04/13/2020)   [employer] of Occupational Health - Occupational Stress Questionnaire    Feeling of Stress : Not at all  Social Connections: Unknown (04/13/2020)   Social Connection and Isolation Panel [NHANES]    Frequency of Communication with Friends and Family: Not on file    Frequency of Social Gatherings with Friends and Family: Not on file    Attends Religious Services: Not on file    Active Member of Clubs or Organizations: Not on file    Attends Banker Meetings: Not on file    Marital Status: Married  Intimate Partner Violence: Not At Risk (01/10/2023)   Humiliation, Afraid, Rape, and Kick questionnaire    Fear of Current or Ex-Partner: No    Emotionally Abused: No    Physically Abused: No    Sexually Abused: No    Family History  Problem Relation Age of Onset   Stroke Mother    Arthritis Father    Heart disease Father    Brain cancer Brother        1/2 brother  (possibly related to mother NOT related to patient)   Cancer Cousin        unk type   Lymphoma Cousin        vs leukemia   Colon cancer Neg Hx    Esophageal cancer Neg Hx    Pancreatic cancer Neg Hx    Stomach cancer Neg Hx    Liver disease Neg Hx      Current Outpatient Medications:    Calcium Carb-Cholecalciferol (CALCIUM 600 + D PO), Take 1 tablet by mouth in the morning., Disp: , Rfl:    cholecalciferol (VITAMIN D3)  25 MCG (1000 UNIT) tablet, Take 1,000 Units by mouth in the morning., Disp: , Rfl:    Cranberry 500 MG CAPS, Take 500 mg by mouth in the morning., Disp: , Rfl:    denosumab (PROLIA) 60 MG/ML SOSY injection, Inject 60 mg into the skin every 6 (six) months., Disp: 1 mL, Rfl: 1   lipase/protease/amylase (CREON) 36000 UNITS CPEP capsule, Take 2 capsules with the first bite of each meal and 1 capsule with the first bite of each snack, Disp: 240 capsule, Rfl: 5   loratadine (CLARITIN) 10 MG tablet, Take 10 mg by mouth daily as needed for allergies., Disp: , Rfl:    metoprolol succinate (TOPROL-XL) 50 MG 24 hr tablet, Take 50 mg by mouth daily., Disp: , Rfl:    Misc Natural Products (JOINT HEALTH PO), Take 1 tablet by mouth every evening., Disp: , Rfl:    naproxen sodium (ALEVE) 220 MG tablet, Take 220 mg by mouth daily as needed., Disp: , Rfl:    omeprazole (PRILOSEC) 40 MG capsule, TAKE 1 CAPSULE (40 MG TOTAL) BY MOUTH DAILY., Disp: 30 capsule, Rfl: 0   Polyethyl Glycol-Propyl Glycol (SYSTANE OP), Place 1 drop into both eyes 2 (two) times daily as needed (dry/irritated eyes.)., Disp: , Rfl:    tamsulosin (FLOMAX) 0.4 MG CAPS capsule, TAKE 1 CAPSULE BY MOUTH EVERY DAY, Disp: 90 capsule, Rfl: 1   vitamin B-12 (CYANOCOBALAMIN) 1000 MCG tablet, Take 1,000 mcg by mouth every evening., Disp: , Rfl:    acetaminophen (TYLENOL) 500 MG tablet, Take 1,000 mg by mouth every 6 (six) hours as needed for moderate pain. (Patient not taking: Reported on 05/12/2023), Disp: , Rfl:    Blood  Glucose Monitoring Suppl (ONETOUCH VERIO) w/Device KIT, Please use to check blood sugars once daily. Dx code: E11.65 (Patient not taking: Reported on 02/23/2023), Disp: 1 kit, Rfl: 0   Blood Glucose Monitoring Suppl DEVI, 1 each by Does not apply route daily. May substitute to any manufacturer covered by patient's insurance. (Patient not taking: Reported on 02/23/2023), Disp: 1 each, Rfl: 0   Glucose Blood (BLOOD GLUCOSE TEST STRIPS) STRP, May substitute to any manufacturer covered by patient's insurance.  Use to  check blood sugar once daily at variable times (Patient not taking: Reported on 02/23/2023), Disp: 100 strip, Rfl: 0   glucose blood (ONETOUCH VERIO) test strip, Use to check blood sugars one time daily. Dx code: E11.65 (Patient not taking: Reported on 05/12/2023), Disp: 100 each, Rfl: 2   Lancets (ONETOUCH ULTRASOFT) lancets, Use to check blood sugars once daily. Dx code: E11.65 (Patient not taking: Reported on 05/12/2023), Disp: 100 each, Rfl: 2 No current facility-administered medications for this visit.  Facility-Administered Medications Ordered in Other Visits:    heparin lock flush 100 UNIT/ML injection, , , ,    heparin lock flush 100 unit/mL, 500 Units, Intravenous, Once, [name], MD  Physical exam:  Vitals:   05/12/23 0919  BP: 113/71  Pulse: 72  Resp: 18  Temp: (!) 97 F (36.1 C)  TempSrc: Tympanic  Weight: 178 lb 8 oz (81 kg)  Height: 6\' 3"  (1.905 m)   Physical Exam Cardiovascular:     Rate and Rhythm: Normal rate and regular rhythm.     Heart sounds: Normal heart sounds.  Pulmonary:     Effort: Pulmonary effort is normal.     Breath sounds: Normal breath sounds.  Skin:    General: Skin is warm and dry.  Neurological:  Mental Status: He is alert and oriented to person, place, and time.         Latest Ref Rng & Units 04/24/2023    9:59 AM  CMP  Glucose 70 - 99 mg/dL 161   BUN 8 - 23 mg/dL 27   Creatinine 0.96 - 1.24 mg/dL 0.45   Sodium 409 - 811  mmol/L 136   Potassium 3.5 - 5.1 mmol/L 3.7   Chloride 98 - 111 mmol/L 100   CO2 22 - 32 mmol/L 28   Calcium 8.9 - 10.3 mg/dL 8.8   Total Protein 6.5 - 8.1 g/dL 6.9   Total Bilirubin <9.1 mg/dL 0.7   Alkaline Phos 38 - 126 U/L 59   AST 15 - 41 U/L 35   ALT 0 - 44 U/L 37       Latest Ref Rng & Units 04/24/2023    9:59 AM  CBC  WBC 4.0 - 10.5 K/uL 6.6   Hemoglobin 13.0 - 17.0 g/dL 47.8   Hematocrit 29.5 - 52.0 % 40.2   Platelets 150 - 400 K/uL 248     No images are attached to the encounter.  NM PET Image Restag (PS) Skull Base To Thigh  Result Date: 05/05/2023 CLINICAL DATA:  Subsequent treatment strategy for metastatic pancreatic cancer. Enlarging pulmonary nodules on chest CT. EXAM: NUCLEAR MEDICINE PET SKULL BASE TO THIGH TECHNIQUE: 9.96 mCi F-18 FDG was injected intravenously. Full-ring PET imaging was performed from the skull base to thigh after the radiotracer. CT data was obtained and used for attenuation correction and anatomic localization. Fasting blood glucose: 107 mg/dl COMPARISON:  Chest CT 62/13/0865 and 10/27/2022.  PET-CT 02/16/2023 FINDINGS: Mediastinal blood pool activity: SUV max 2.6 NECK: No hypermetabolic cervical lymph nodes are identified.Fairly symmetric activity within the lymphoid tissue of Waldeyer's ring is within physiologic limits. No suspicious activity identified within the pharyngeal mucosal space. Intensely hypermetabolic nodule involving the thyroid isthmus is again noted, measuring 1.8 cm on image 48/6 and demonstrating an SUV max of 6.8. This is not significantly changed from the comparison PET-CT. Incidental CT findings: Bilateral carotid atherosclerosis. CHEST: There are no hypermetabolic mediastinal, hilar or axillary lymph nodes. Again demonstrated are multiple hypermetabolic pulmonary nodules bilaterally. Most of the nodules are similar in size to the previous study, although there is mildly increased metabolic activity within some of the nodules. For  example, an approximately 1.5 cm peribronchial right lower lobe nodule on image 74/6 has an SUV max of 6.5 (previously 3.2). 2.0 cm right lower lobe nodule on image 84/6 has an SUV max of 7.4 (previously 6.9). 1.3 cm left lower lobe nodule on image 84/6 has an SUV max of 5.5 (previously 4.0). Incidental CT findings: Mild centrilobular emphysema. Right IJ Port-A-Cath extends into the right atrium. Atherosclerosis of the aorta, great vessels and coronary arteries. ABDOMEN/PELVIS: There is no hypermetabolic activity within the liver, adrenal glands or pancreas. There is no hypermetabolic nodal activity in the abdomen or pelvis. Low level metabolic activity peripherally in the left lobe of the prostate (SUV max 4.9) similar to previous study. Incidental CT findings: Status post distal pancreatectomy and splenectomy. Stable fat necrosis in the left subphrenic region. Aortic and branch vessel atherosclerosis. Fiducial markers in the prostate gland which remains mildly enlarged. SKELETON: There is no hypermetabolic activity to suggest osseous metastatic disease. Incidental CT findings: Unchanged midthoracic compression deformity. IMPRESSION: 1. No significant change in the size of numerous hypermetabolic pulmonary nodules bilaterally consistent with metastatic disease. There has been some  increase in the metabolic activity associated with some of the nodules compared with the previous PET-CT of 2 months ago. 2. No evidence of recurrence in the abdomen or extra thoracic metastatic disease. 3. Unchanged hypermetabolic thyroid nodule since at least 09/29/2020 PET-CT. Electronically Signed   By: Carey Bullocks M.D.   On: 05/05/2023 16:32   CT SUPER D CHEST WO CONTRAST  Result Date: 05/01/2023 CLINICAL DATA:  Pulmonary nodules, pre bronch planning. Pancreatic cancer. History of prostate cancer and basal skin cancer. EXAM: CT CHEST WITHOUT CONTRAST TECHNIQUE: Multidetector CT imaging of the chest was performed using thin  slice collimation for electromagnetic bronchoscopy planning purposes, without intravenous contrast. RADIATION DOSE REDUCTION: This exam was performed according to the departmental dose-optimization program which includes automated exposure control, adjustment of the mA and/or kV according to patient size and/or use of iterative reconstruction technique. COMPARISON:  PET-CT dated 02/16/2023 FINDINGS: Cardiovascular: The heart is normal in size. No pericardial effusion. No evidence of thoracic aortic aneurysm. Atherosclerotic calcifications of the aortic arch. Mild three-vessel coronary atherosclerosis. Right chest port terminates at the cavoatrial junction. Mediastinum/Nodes: Small mediastinal nodes, including a 6 mm short axis subcarinal node (series 2/image 37), within normal limits. Visualized thyroid is unremarkable. Lungs/Pleura: Biapical pleural-parenchymal scarring. Numerous bilateral pulmonary nodules, approximately 25-30 in number, including: --13 mm subpleural nodule in the anterior left upper lobe (series 3/image 30) --13 mm subpleural nodule in the posterior left lower lobe (series 3/image 106) --Two nodules in the medial right lung base measuring 18-19 mm (series 3/images 138 and 145) No focal consolidation. No pleural effusion or pneumothorax. Upper Abdomen: Visualized upper abdomen is notable for cirrhosis, prior cholecystectomy, vascular calcifications, and a 14 mm right renal artery aneurysm. Musculoskeletal: Prior vertebral augmentation at T9. Moderate superior endplate central compression deformity at L2. IMPRESSION: Numerous bilateral pulmonary nodules/metastases, approximately 25-30 in number, with index lesions as above. Aortic Atherosclerosis (ICD10-I70.0). Electronically Signed   By: Charline Bills M.D.   On: 05/01/2023 23:57   DG Chest Port 1 View  Result Date: 04/18/2023 CLINICAL DATA:  89 year old male status post bronchoscopy. EXAM: PORTABLE CHEST 1 VIEW COMPARISON:  Chest CT  yesterday, and earlier. FINDINGS: Portable AP upright view at 0918 hours. Stable right chest Port-A-Cath. Lower lung volumes compared to July. Stable cardiac size and mediastinal contours. Visualized tracheal air column is within normal limits. No pneumothorax, pulmonary edema, pleural effusion or consolidation. Multinodular abnormal lung opacity demonstrated by CT is subtle radiographically, 1 left lower lung nodule is visible just beneath the left lateral 7th rib. No acute osseous abnormality identified. Negative visible bowel gas. IMPRESSION: 1. No pneumothorax or other adverse features following bronchoscopy. 2. Multinodular abnormal lung opacities demonstrated by CT are subtle by radiograph. Electronically Signed   By: Odessa Fleming M.D.   On: 04/18/2023 09:51   DG C-ARM BRONCHOSCOPY  Result Date: 04/18/2023 C-ARM BRONCHOSCOPY: Fluoroscopy was utilized by the requesting physician.  No radiographic interpretation.     Assessment and plan- Patient is a 89 y.o. male with history of pancreatic adenocarcinoma s/p perioperative neoadjuvant and adjuvant chemotherapy completed in January 2023 noted to have gradually worsening bilateral lung nodules concerning for malignancy.  He is Here to discuss further management  PET CT scan on 05/02/2023 showed no significant change in multiple hypermetabolic lung nodules which was reported to be consistent with metastatic disease with an SUV ranging between 5-7.  No evidence of disease elsewhere.  After patient left the clinic 2 weeks ago he had multiple questions and clarified  the following below:  I am hesitant to start empiric chemotherapy in the absence of biopsy-proven metastatic disease.  Palliative FOLFIRI at this time is a consideration presuming that the lung nodules reflect back metastatic pancreatic cancer although biopsy has been negative x 2.  Discussed risks and benefits of FOLFIRI chemotherapy including all but not limited to nausea vomiting low blood  counts risk of infections hospitalizations as well as diarrhea.  Treatment is typically given until progression or toxicity. Patient was interested in knowing about Xeloda and I explained to him that Xeloda as it is essentially the same as 5-FU but in pill form.  Xeloda can be associated with nausea vomiting abdominal pain skin rash and diarrhea and sometimes harder to tolerate as compared to 5-FU. Patient was interested in knowing about SBRT and nothing that there is an option given multiple lung nodules. I would recommend getting a second opinion at [facility] with Dr. Alena Bills and I personally discussed patient's case with him.  He is willing to see him for second opinion.   I am holding off on blood Tempus testing at this time since it is not going to provide a diagnosis of pancreatic cancer in metastatic setting and the chances of finding any actionable mutations is low.  I have explained how NGS testing works in detail  Patient and his wife and daughter comprehend the about conversation well and they are agreeable to going to [facility] for second opinion at this time.  Starting any chemotherapy will be on hold until then    Visit Diagnosis 1. Lung nodules   2. Malignant neoplasm of body of pancreas (HCC)   3. Goals of care, counseling/discussion      Dr. Owens Shark, MD, MPH [facility] [phone] 05/12/2023 1:02 PM

## 2023-05-17 ENCOUNTER — Telehealth: Payer: Self-pay | Admitting: *Deleted

## 2023-05-17 NOTE — Telephone Encounter (Signed)
I called [facility] just to verify since he has been seen there before and they have him scheduled 05/24/2023.

## 2023-05-17 NOTE — Telephone Encounter (Signed)
I had referred him to dr agrawal. After doing the whole referral process now he is not covered by them. Please refer to [facility]

## 2023-05-17 NOTE — Telephone Encounter (Signed)
So his insurance is ok with [facility]?

## 2023-05-17 NOTE — Telephone Encounter (Signed)
This patient was referred to [facility]. [facility] called to say that his insurance does not cover care at [facility].

## 2023-05-22 DIAGNOSIS — B5801 Toxoplasma chorioretinitis: Secondary | ICD-10-CM | POA: Diagnosis not present

## 2023-05-22 DIAGNOSIS — H353132 Nonexudative age-related macular degeneration, bilateral, intermediate dry stage: Secondary | ICD-10-CM | POA: Diagnosis not present

## 2023-05-22 DIAGNOSIS — H5203 Hypermetropia, bilateral: Secondary | ICD-10-CM | POA: Diagnosis not present

## 2023-05-23 ENCOUNTER — Telehealth: Payer: Self-pay | Admitting: *Deleted

## 2023-05-23 NOTE — Telephone Encounter (Signed)
The pt called about that [facility] let him know was out of network and he does not know what to do he called [facility] [employer]. They told him to have the cancer center to send last notes about the issue of and to send a paper that Dr. Smith Robert rec: to go to [facility]. All of this was sent to insurance and the fax went through.pt. is going to the consult and cost 662 dollars . Hope that the fax we sent to insurance can help. Pt. Called about this 4:35 today.

## 2023-05-24 ENCOUNTER — Telehealth: Payer: Self-pay

## 2023-05-24 DIAGNOSIS — R911 Solitary pulmonary nodule: Secondary | ICD-10-CM | POA: Diagnosis not present

## 2023-05-24 DIAGNOSIS — C251 Malignant neoplasm of body of pancreas: Secondary | ICD-10-CM | POA: Diagnosis not present

## 2023-05-24 NOTE — Telephone Encounter (Signed)
Attempted to return call to Mr. Vitiello. No answer left voicemail. Previously spoke to [name] regarding his insurance being OON for [facility]. He stated he was aware and made the appointment anyway thinking that a consult would not be that different between network and OON. I cannot advise on the difference financially.

## 2023-05-26 ENCOUNTER — Encounter: Payer: Self-pay | Admitting: Oncology

## 2023-06-06 ENCOUNTER — Other Ambulatory Visit: Payer: Self-pay | Admitting: Gastroenterology

## 2023-06-06 ENCOUNTER — Telehealth: Payer: Self-pay

## 2023-06-06 NOTE — Telephone Encounter (Signed)
Last office visit 01/31/2023 Calculus of Bile duct  09/01/2021 pancreatic insufficiency  Last refill Omeprazole 40 daily 05/08/2023 0 refills

## 2023-06-06 NOTE — Telephone Encounter (Signed)
Call returned to George Owens regarding his questions regarding authorization for his biopsy at [facility]. All authorization for procedures at [facility] are authorized by [facility]. He stated that they told him no authorization was required fir his biopsy. If he would like to confirm that, as he sounds concerned, he can call his insurance and verify.

## 2023-06-07 ENCOUNTER — Telehealth: Payer: Self-pay | Admitting: Gastroenterology

## 2023-06-07 DIAGNOSIS — K625 Hemorrhage of anus and rectum: Secondary | ICD-10-CM

## 2023-06-07 MED ORDER — HYDROCORTISONE (PERIANAL) 2.5 % EX CREA: 1.0000 | TOPICAL_CREAM | Freq: Two times a day (BID) | CUTANEOUS | 0 refills | Status: DC

## 2023-06-07 NOTE — Telephone Encounter (Signed)
He was evaluated for rectal bleeding in 2021, found to have diverticulosis and medium to large hemorrhoids. Recommend to check CBC and try Anusol cream pea-sized per rectum twice daily for 7 to 10 days to treat flareup of hemorrhoids, possibly causing rectal bleeding  RV

## 2023-06-07 NOTE — Telephone Encounter (Signed)
Patient verbalized understanding of instructions. He informed me what pharmacy to send the medication to. Sent it to that pharmacy. He will go and have CBC done tomorrow

## 2023-06-07 NOTE — Telephone Encounter (Signed)
[place]. Patient states he started to have rectal bleeding last night. He states he was walking a lot yesterday and hour of two after he stopped walking he had bright red rectal bleeding. He states it did not last long but it did scare him. He states this was not with a bowel movement. He states this morning he had a normal bowel movement and did not strain or anything and had bright red blood that filled the toilet. He looked about a hour ago and he has not bleed any more. He states he has had some rectal itching but denies any pain. He states he had a issue like this 3 years ago and you talk about hemorrhoid banding but he was going in for a operation soon after that so he decided to hold off on it. He has not had any more issues till now.  Denies any blood clots

## 2023-06-07 NOTE — Telephone Encounter (Signed)
Patient called in left a voicemail wanting to speak with Morrie Sheldon about rectal bleeding and he don't want to go to the ER local. I called the patient back to inform him we receive his message, and I sent the message.

## 2023-06-07 NOTE — Addendum Note (Signed)
Addended by: Radene Knee L on: 06/07/2023 04:11 PM   Modules accepted: Orders

## 2023-06-08 DIAGNOSIS — K625 Hemorrhage of anus and rectum: Secondary | ICD-10-CM | POA: Diagnosis not present

## 2023-06-09 LAB — CBC WITH DIFFERENTIAL/PLATELET
Basophils Absolute: 0 10*3/uL (ref 0.0–0.2)
Basos: 1 %
EOS (ABSOLUTE): 0.1 10*3/uL (ref 0.0–0.4)
Eos: 1 %
Hematocrit: 39.6 % (ref 37.5–51.0)
Hemoglobin: 13.4 g/dL (ref 13.0–17.7)
Immature Grans (Abs): 0 10*3/uL (ref 0.0–0.1)
Immature Granulocytes: 0 %
Lymphocytes Absolute: 2.2 10*3/uL (ref 0.7–3.1)
Lymphs: 39 %
MCH: 31.8 pg (ref 26.6–33.0)
MCHC: 33.8 g/dL (ref 31.5–35.7)
MCV: 94 fL (ref 79–97)
Monocytes Absolute: 0.7 10*3/uL (ref 0.1–0.9)
Monocytes: 11 %
Neutrophils Absolute: 2.8 10*3/uL (ref 1.4–7.0)
Neutrophils: 48 %
Platelets: 263 10*3/uL (ref 150–450)
RBC: 4.22 x10E6/uL (ref 4.14–5.80)
RDW: 13.3 % (ref 11.6–15.4)
WBC: 5.8 10*3/uL (ref 3.4–10.8)

## 2023-06-16 ENCOUNTER — Telehealth: Payer: Self-pay | Admitting: Gastroenterology

## 2023-06-16 ENCOUNTER — Telehealth: Payer: Self-pay | Admitting: Oncology

## 2023-06-16 NOTE — Telephone Encounter (Addendum)
The patient called in about his blood test result. He stated that he called in on Monday and he said that he was assure that the nurse was going to call once clinic was over and that was over a week ago. I inform him that the nurse is on vacation but I will send his message to another nurse to help him with getting his results. He has a procedure on Monday at [facility] and he want to make sure that are no complications.

## 2023-06-16 NOTE — Telephone Encounter (Signed)
George Owens called and got our answering service. He said he thinks he is due for a port flush appt.Scedulers were notified.

## 2023-06-16 NOTE — Telephone Encounter (Addendum)
Patient called stating that he had a CBC lab drawn on 06/08/2023 and was told that we would call him once we had results. Patient had this lab drawn because he had significant rectal bleeding and Dr. Allegra Lai wanted to make sure that he had any abnormalities showing in his lab test. However, Dr. Allegra Lai and Morrie Sheldon are out on vacation. Patient has a Pre-procedure lab exam scheduled for this coming Monday 06/19/2023 at his Hematologist office at [facility]. He wanted to know if his lab results were within normal to proceed with his procedurebon Monday. Please advise.

## 2023-06-16 NOTE — Telephone Encounter (Signed)
Called patient to let him know what Dr. Servando Snare stated and he stated that he would move forward with his test on Monday.

## 2023-06-22 ENCOUNTER — Inpatient Hospital Stay: Payer: PPO | Attending: Oncology

## 2023-06-22 DIAGNOSIS — C251 Malignant neoplasm of body of pancreas: Secondary | ICD-10-CM | POA: Insufficient documentation

## 2023-06-22 DIAGNOSIS — Z452 Encounter for adjustment and management of vascular access device: Secondary | ICD-10-CM | POA: Diagnosis not present

## 2023-06-22 DIAGNOSIS — Z95828 Presence of other vascular implants and grafts: Secondary | ICD-10-CM

## 2023-06-22 MED ORDER — SODIUM CHLORIDE 0.9% FLUSH
10.0000 mL | Freq: Once | INTRAVENOUS | Status: AC
Start: 2023-06-22 — End: 2023-06-22
  Administered 2023-06-22: 10 mL via INTRAVENOUS
  Filled 2023-06-22: qty 10

## 2023-06-22 MED ORDER — HEPARIN SOD (PORK) LOCK FLUSH 100 UNIT/ML IV SOLN
500.0000 [IU] | Freq: Once | INTRAVENOUS | Status: AC
  Administered 2023-06-22: 500 [IU] via INTRAVENOUS
  Filled 2023-06-22: qty 5

## 2023-06-27 DIAGNOSIS — H43813 Vitreous degeneration, bilateral: Secondary | ICD-10-CM | POA: Diagnosis not present

## 2023-06-27 DIAGNOSIS — H353223 Exudative age-related macular degeneration, left eye, with inactive scar: Secondary | ICD-10-CM | POA: Diagnosis not present

## 2023-06-27 DIAGNOSIS — H31093 Other chorioretinal scars, bilateral: Secondary | ICD-10-CM | POA: Diagnosis not present

## 2023-06-27 DIAGNOSIS — H353211 Exudative age-related macular degeneration, right eye, with active choroidal neovascularization: Secondary | ICD-10-CM | POA: Diagnosis not present

## 2023-06-30 DIAGNOSIS — J984 Other disorders of lung: Secondary | ICD-10-CM | POA: Diagnosis not present

## 2023-06-30 DIAGNOSIS — R911 Solitary pulmonary nodule: Secondary | ICD-10-CM | POA: Diagnosis not present

## 2023-06-30 DIAGNOSIS — C3432 Malignant neoplasm of lower lobe, left bronchus or lung: Secondary | ICD-10-CM | POA: Diagnosis not present

## 2023-06-30 DIAGNOSIS — I517 Cardiomegaly: Secondary | ICD-10-CM | POA: Diagnosis not present

## 2023-06-30 DIAGNOSIS — C251 Malignant neoplasm of body of pancreas: Secondary | ICD-10-CM | POA: Diagnosis not present

## 2023-06-30 DIAGNOSIS — J939 Pneumothorax, unspecified: Secondary | ICD-10-CM | POA: Diagnosis not present

## 2023-06-30 DIAGNOSIS — R918 Other nonspecific abnormal finding of lung field: Secondary | ICD-10-CM | POA: Diagnosis not present

## 2023-07-24 DIAGNOSIS — C252 Malignant neoplasm of tail of pancreas: Secondary | ICD-10-CM | POA: Diagnosis not present

## 2023-07-24 DIAGNOSIS — R911 Solitary pulmonary nodule: Secondary | ICD-10-CM | POA: Diagnosis not present

## 2023-07-24 DIAGNOSIS — C259 Malignant neoplasm of pancreas, unspecified: Secondary | ICD-10-CM | POA: Diagnosis not present

## 2023-08-02 ENCOUNTER — Inpatient Hospital Stay: Payer: PPO | Attending: Oncology | Admitting: Oncology

## 2023-08-02 ENCOUNTER — Encounter: Payer: Self-pay | Admitting: Oncology

## 2023-08-02 VITALS — BP 102/66 | HR 76 | Temp 95.6°F | Resp 18 | Wt 183.5 lb

## 2023-08-02 DIAGNOSIS — C7800 Secondary malignant neoplasm of unspecified lung: Secondary | ICD-10-CM | POA: Diagnosis not present

## 2023-08-02 DIAGNOSIS — C251 Malignant neoplasm of body of pancreas: Secondary | ICD-10-CM | POA: Diagnosis not present

## 2023-08-02 DIAGNOSIS — C259 Malignant neoplasm of pancreas, unspecified: Secondary | ICD-10-CM | POA: Diagnosis not present

## 2023-08-02 DIAGNOSIS — Z7189 Other specified counseling: Secondary | ICD-10-CM

## 2023-08-02 NOTE — Progress Notes (Signed)
Hematology/Oncology Consult note [facility]  Telephone:([phone] [phone]  Patient Care Team: [name], MD as PCP - General (Internal Medicine) [name], RN as Oncology Nurse Navigator [name], MD as Consulting Physician (Oncology)   Name of the patient: George Owens  [id number]  1931-09-27   Date of visit: 08/02/23  Diagnosis-metastatic pancreatic cancer with lung metastases  Chief complaint/ Reason for visit-discuss further management of metastatic pancreatic cancer  Heme/Onc history: patient is a 89 year old male with a past medical history significant for GERD, hyperlipidemia, cirrhosis.  He was having symptoms of Abdominal pain and reflux and therefore underwent a CT abdomen and pelvis with contrast on 09/11/2020 which showed a hypointense ill-defined mass in the pancreatic body up to 25 mm.  No extrapancreatic infiltrative density about the aorta or proximal [name].  No local regional adenopathy or distant metastatic disease.  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  This was followed by an EUS at [facility] which showed a hypoechoic mass measuring 1.8 x 1.6 cm in the pancreatic body.  Endosonographic borders poorly defined.  Upstream pancreatic duct dilatation.  No abnormal appearing lymph nodes.  Endosonographic imaging of the liver showed no lesion.  Biopsy showed adenocarcinoma.   PET scan showed 2.7 x 2 cm ovoid mass in the body of the pancreas with an SUV of 7.1.  18 mm hypermetabolic nodule in the isthmus of the thyroid gland.  No evidence of local regional adenopathy or distant metastatic disease.   Patient was evaluated by Dr. [name] at [facility] for consideration of surgery and has been deemed to be upfront resectable surgical candidate.  However plan was to offer him neoadjuvant chemotherapy for 3 months to control micrometastatic disease before proceeding with definitive surgery.  If ultimately patient decides not to  proceed with surgery then radiation at [facility] also remains an option.   Patient received 8 doses of gemcitabine and Abraxane chemotherapy 1 week on 1 week off due to neutropenia and thrombocytopenia. Patient underwent laparoscopy surgery by Dr. [name] at [facility].  Final pathology showed 3 cm grade 3 poorly differentiated adenocarcinoma of the pancreas with negative margins.  15 lymph nodes negative for malignancy.  Absent chemotherapy response with extensive residual cancer and no evidence of tumor regression.  Perineural invasion identified lymphovascular invasion not identified.  Patient completed 4 months of adjuvant gemcitabine Abraxane chemotherapy in January 2023.    Patient noted to Have bilateral lung nodules that were subcentimeter dating back to April 2023 and since then they have gradually grown in size.  He had bronchoscopy and attempts to biopsy twice in May 2024 and October 2024 and results have been negative for malignancy.  Patient underwent CT-guided Biopsy at [facility] which was consistent with adenocarcinoma although based on the pathology it was not ruled out if it was primary pancreatic versus mucinous lung.  NGS testing was done both on original pancreatic cancer specimen as well as lung biopsy specimen. Variants in KRAS, CDKN2A, and TP53 were detected, all consistent with that reported in this individual's previous sample ([id number], [id number]). The KRAS p.G12R variant, a cancer hotspot mutation, results in gain-of-function and is frequently found pancreatic adenocarcinoma.  Based on this it was concluded that the lung nodules are consistent with metastatic pancreatic cancer.  Microsatellite stable.  Low tumor mutational burden.  He was not deemed to be a candidate for clinical trial at [facility].  Interval history- reports some difficulty swallowing which has been on and off  since his bronchoscopy. Denies other complaints today  ECOG PS- 1 Pain scale- 0   Review of systems- Review of  Systems  Constitutional:  Negative for chills, fever, malaise/fatigue and weight loss.  HENT:  Negative for congestion, ear discharge and nosebleeds.   Eyes:  Negative for blurred vision.  Respiratory:  Negative for cough, hemoptysis, sputum production, shortness of breath and wheezing.   Cardiovascular:  Negative for chest pain, palpitations, orthopnea and claudication.  Gastrointestinal:  Negative for abdominal pain, blood in stool, constipation, diarrhea, heartburn, melena, nausea and vomiting.  Genitourinary:  Negative for dysuria, flank pain, frequency, hematuria and urgency.  Musculoskeletal:  Negative for back pain, joint pain and myalgias.  Skin:  Negative for rash.  Neurological:  Negative for dizziness, tingling, focal weakness, seizures, weakness and headaches.  Endo/Heme/Allergies:  Does not bruise/bleed easily.  Psychiatric/Behavioral:  Negative for depression and suicidal ideas. The patient does not have insomnia.       Allergies  Allergen Reactions   Kenalog [Triamcinolone Acetonide]     Blindness X 3 Days injection   Tramadol Hcl     Dizzy and Weakness     Past Medical History:  Diagnosis Date   Adenocarcinoma of pancreas (HCC)    a.) stage 1B poorly differentiated adenocarcinoma of the pancreas (grade 3) s/p distal pancreatectomy and splenectomy (negative margins); 15 lymph nodes negative for malignancy   Allergy    Aortic atherosclerosis (HCC)    Basal cell carcinoma    right temple 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   Colon polyps    Coronary artery calcification seen on CT scan    COVID-19 virus infection 03/16/2020   Diastolic dysfunction 10/09/2020   a.) TTE 10/09/2020: EF 60-65%, no RWMAs, normal RVSF, Ao root 39 mm, G1DD   GERD (gastroesophageal reflux disease)    Hemorrhoids    Hepatic steatosis    Hyperlipidemia    Hypertension    Leg cramps     Lung nodules 10/07/2021   Neuropathy    Osteoporosis    a.) on denosumab injections   Paroxysmal A-fib (HCC) 02/25/2021   a.) developed postop following distal pancreatectomy and splenectomy; b.) CHA2DS2-VASc = 4 (age x2, HTN, vascular disease history); b.) cardiac rate/rhythm maintained on oral metoprolol succinate; no chronic OAC   Pneumonia 2022   Prostate cancer (HCC) 2006   Renal artery aneurysm (HCC)    Secondary diabetes (HCC)    does not check blood sugar   Thoracic ascending aortic aneurysm (HCC) 04/29/2020   a.) CT chest 04/29/2020: 4.7 cm; b.) CT CAP 08/04/2021: 4.6 cm; c.) CT chest 10/08/2021: 4.5 cm; d.) CT chest 02/25/2022: 4.6 cm; e.) CT chest 10/07/2022: 4.5 cm   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     Past Surgical History:  Procedure Laterality Date   BRONCHIAL BRUSHINGS  11/01/2022   Procedure: BRONCHIAL BRUSHINGS;  Surgeon: Raechel Chute, MD;  Location: [facility] ENDOSCOPY;  Service: Pulmonary;;   BRONCHIAL NEEDLE ASPIRATION BIOPSY  11/01/2022   Procedure: BRONCHIAL NEEDLE ASPIRATION BIOPSIES;  Surgeon: Raechel Chute, MD;  Location: [facility] ENDOSCOPY;  Service: Pulmonary;;   BRONCHIAL NEEDLE ASPIRATION BIOPSY  04/18/2023   Procedure: BRONCHIAL NEEDLE ASPIRATION BIOPSIES;  Surgeon: Raechel Chute, MD;  Location: [facility] ENDOSCOPY;  Service: Pulmonary;;   BRONCHIAL WASHINGS  11/01/2022   Procedure: BRONCHIAL WASHINGS;  Surgeon: Raechel Chute, MD;  Location: [facility] ENDOSCOPY;  Service: Pulmonary;;   CATARACT  EXTRACTION Right    CATARACT EXTRACTION W/PHACO Left 12/26/2017   Procedure: CATARACT EXTRACTION PHACO AND INTRAOCULAR LENS PLACEMENT (IOC)  COMPLICATED LEFT;  Surgeon: Lockie Mola, MD;  Location: [facility];  Service: Ophthalmology;  Laterality: Left;  MALYUGIN VISION BLUE   CHOLECYSTECTOMY  03/01/2023   at [facility]   colonoscopy     ESOPHAGOGASTRODUODENOSCOPY (EGD) WITH PROPOFOL     EYE MUSCLE SURGERY Left    IR KYPHO THORACIC WITH BONE BIOPSY  11/11/2021    LAPAROSCOPIC SPLENECTOMY  02/25/2021   PORTA CATH INSERTION N/A 10/19/2020   Procedure: PORTA CATH INSERTION;  Surgeon: Annice Needy, MD;  Location: [facility] INVASIVE CV LAB;  Service: Cardiovascular;  Laterality: N/A;   TONSILLECTOMY AND ADENOIDECTOMY     XI ROBOTIC ASSISTED LAPAROSCOPIC DISTAL PANCREATECTOMY  02/25/2021    Social History   Socioeconomic History   Marital status: Married    Spouse name: Eloise   Number of children: 4   Years of education: Not on file   Highest education level: Not on file  Occupational History   Occupation: retired  Tobacco Use   Smoking status: Never   Smokeless tobacco: Never  Vaping Use   Vaping status: Never Used  Substance and Sexual Activity   Alcohol use: No   Drug use: No   Sexual activity: Yes    Partners: Female  Other Topics Concern   Not on file  Social History Narrative   Lives with wife in a 5 story retirement home.  He lives on the first floor.  4 year college degree.  Retired Corporate investment banker.  Married 4 years to his first wife's sister.     Social Drivers of Corporate investment banker Strain: Low Risk  (04/13/2020)   Overall Financial Resource Strain (CARDIA)    Difficulty of Paying Living Expenses: Not hard at all  Food Insecurity: No Food Insecurity (01/13/2023)   Hunger Vital Sign    Worried About Running Out of Food in the Last Year: Never true    Ran Out of Food in the Last Year: Never true  Transportation Needs: No Transportation Needs (01/13/2023)   PRAPARE - Administrator, Civil Service (Medical): No    Lack of Transportation (Non-Medical): No  Physical Activity: Sufficiently Active (04/10/2019)   Exercise Vital Sign    Days of Exercise per Week: 5 days    Minutes of Exercise per Session: 30 min  Stress: No Stress Concern Present (04/13/2020)   [employer] of Occupational Health - Occupational Stress Questionnaire    Feeling of Stress : Not at all  Social Connections: Unknown (04/13/2020)   Social  Connection and Isolation Panel [NHANES]    Frequency of Communication with Friends and Family: Not on file    Frequency of Social Gatherings with Friends and Family: Not on file    Attends Religious Services: Not on file    Active Member of Clubs or Organizations: Not on file    Attends Banker Meetings: Not on file    Marital Status: Married  Intimate Partner Violence: Not At Risk (01/10/2023)   Humiliation, Afraid, Rape, and Kick questionnaire    Fear of Current or Ex-Partner: No    Emotionally Abused: No    Physically Abused: No    Sexually Abused: No    Family History  Problem Relation Age of Onset   Stroke Mother    Arthritis Father    Heart disease Father    Brain cancer Brother  1/2 brother (possibly related to mother NOT related to patient)   Cancer Cousin        unk type   Lymphoma Cousin        vs leukemia   Colon cancer Neg Hx    Esophageal cancer Neg Hx    Pancreatic cancer Neg Hx    Stomach cancer Neg Hx    Liver disease Neg Hx      Current Outpatient Medications:    acetaminophen (TYLENOL) 500 MG tablet, Take 1,000 mg by mouth every 6 (six) hours as needed for moderate pain. (Patient not taking: Reported on 05/12/2023), Disp: , Rfl:    Blood Glucose Monitoring Suppl (ONETOUCH VERIO) w/Device KIT, Please use to check blood sugars once daily. Dx code: E11.65 (Patient not taking: Reported on 02/23/2023), Disp: 1 kit, Rfl: 0   Blood Glucose Monitoring Suppl DEVI, 1 each by Does not apply route daily. May substitute to any manufacturer covered by patient's insurance. (Patient not taking: Reported on 02/23/2023), Disp: 1 each, Rfl: 0   Calcium Carb-Cholecalciferol (CALCIUM 600 + D PO), Take 1 tablet by mouth in the morning., Disp: , Rfl:    cholecalciferol (VITAMIN D3) 25 MCG (1000 UNIT) tablet, Take 1,000 Units by mouth in the morning., Disp: , Rfl:    Cranberry 500 MG CAPS, Take 500 mg by mouth in the morning., Disp: , Rfl:    denosumab (PROLIA) 60  MG/ML SOSY injection, Inject 60 mg into the skin every 6 (six) months., Disp: 1 mL, Rfl: 1   Glucose Blood (BLOOD GLUCOSE TEST STRIPS) STRP, May substitute to any manufacturer covered by patient's insurance.  Use to  check blood sugar once daily at variable times (Patient not taking: Reported on 02/23/2023), Disp: 100 strip, Rfl: 0   glucose blood (ONETOUCH VERIO) test strip, Use to check blood sugars one time daily. Dx code: E11.65 (Patient not taking: Reported on 05/12/2023), Disp: 100 each, Rfl: 2   hydrocortisone (ANUSOL-HC) 2.5 % rectal cream, Place 1 Application rectally 2 (two) times daily., Disp: 30 g, Rfl: 0   Lancets (ONETOUCH ULTRASOFT) lancets, Use to check blood sugars once daily. Dx code: E11.65 (Patient not taking: Reported on 05/12/2023), Disp: 100 each, Rfl: 2   lipase/protease/amylase (CREON) 36000 UNITS CPEP capsule, Take 2 capsules with the first bite of each meal and 1 capsule with the first bite of each snack, Disp: 240 capsule, Rfl: 5   loratadine (CLARITIN) 10 MG tablet, Take 10 mg by mouth daily as needed for allergies., Disp: , Rfl:    metoprolol succinate (TOPROL-XL) 50 MG 24 hr tablet, Take 50 mg by mouth daily., Disp: , Rfl:    Misc Natural Products (JOINT HEALTH PO), Take 1 tablet by mouth every evening., Disp: , Rfl:    naproxen sodium (ALEVE) 220 MG tablet, Take 220 mg by mouth daily as needed., Disp: , Rfl:    omeprazole (PRILOSEC) 40 MG capsule, TAKE 1 CAPSULE (40 MG TOTAL) BY MOUTH DAILY., Disp: 90 capsule, Rfl: 1   Polyethyl Glycol-Propyl Glycol (SYSTANE OP), Place 1 drop into both eyes 2 (two) times daily as needed (dry/irritated eyes.)., Disp: , Rfl:    tamsulosin (FLOMAX) 0.4 MG CAPS capsule, TAKE 1 CAPSULE BY MOUTH EVERY DAY, Disp: 90 capsule, Rfl: 1   vitamin B-12 (CYANOCOBALAMIN) 1000 MCG tablet, Take 1,000 mcg by mouth every evening., Disp: , Rfl:  No current facility-administered medications for this visit.  Facility-Administered Medications Ordered in Other  Visits:    heparin lock flush  100 UNIT/ML injection, , , ,    heparin lock flush 100 unit/mL, 500 Units, Intravenous, Once, [name], MD  Physical exam:  Vitals:   08/02/23 0850  BP: 102/66  Pulse: 76  Resp: 18  Temp: (!) 95.6 F (35.3 C)  TempSrc: Tympanic  SpO2: 96%  Weight: 183 lb 8 oz (83.2 kg)   Physical Exam Cardiovascular:     Rate and Rhythm: Normal rate and regular rhythm.     Heart sounds: Normal heart sounds.  Pulmonary:     Effort: Pulmonary effort is normal.     Breath sounds: Normal breath sounds.  Skin:    General: Skin is warm and dry.  Neurological:     Mental Status: He is alert and oriented to person, place, and time.        Latest Ref Rng & Units 04/24/2023    9:59 AM  CMP  Glucose 70 - 99 mg/dL 829   BUN 8 - 23 mg/dL 27   Creatinine 5.62 - 1.24 mg/dL 1.30   Sodium 865 - 784 mmol/L 136   Potassium 3.5 - 5.1 mmol/L 3.7   Chloride 98 - 111 mmol/L 100   CO2 22 - 32 mmol/L 28   Calcium 8.9 - 10.3 mg/dL 8.8   Total Protein 6.5 - 8.1 g/dL 6.9   Total Bilirubin <6.9 mg/dL 0.7   Alkaline Phos 38 - 126 U/L 59   AST 15 - 41 U/L 35   ALT 0 - 44 U/L 37       Latest Ref Rng & Units 06/08/2023   11:37 AM  CBC  WBC 3.4 - 10.8 x10E3/uL 5.8   Hemoglobin 13.0 - 17.7 g/dL 62.9   Hematocrit 52.8 - 51.0 % 39.6   Platelets 150 - 450 x10E3/uL 263       Assessment and plan- Patient is a 89 y.o. male with history of stage I pancreatic adenocarcinoma s/p resection in September 2022 presenting with lung metastases here to discuss further management  Patient received perioperative gemcitabine Abraxane chemotherapy back in 2022 for 6 months.  He was noted to have gradually increasing subcentimeter lung nodules dating back to April 2023 which was concerning for metastatic disease.  Biopsy x 2 in [place] was negative for malignancy.  Patient finally went for a CT-guided biopsy at [facility] in January 2025 which was consistent with metastatic pancreatic cancer.   Discussed with the patient that his pancreatic cancer has been behaving atypically given the slow progression of lung nodules and absence of metastatic disease in other sites.  Now that we have biopsy-proven metastatic pancreatic cancer we have the following options moving forward:  Attempt modified FOLFIRINOX chemotherapy IV every 2 weeks and see if the patient tolerates it.  Discussed risks and benefits of chemotherapy including all but not limited to nausea vomiting low blood counts risk of infections and hospitalization.  Risk of diarrhea associated with irinotecan and referral neuropathy associated with oxaliplatin.  If he does not tolerate the combination well I will consider dropping oxaliplatin altogether and proceed with FOLFIRI alone. Consideration for single agent 72fu alone or xeloda Q2-Q3 weeks to begin with and based on tolerance decide about adding irinotecan and/or oxaliplatin No further chemotherapy and consideration for best supportive care/hospice Reattempting gemcitabine Abraxane which would not be my preference given that he stopped this chemotherapy in January 2023 and developed lung nodules in April 2023 itself thereby indicating that this was not very effective and preventing the development of lung metastases.  I will proceed with a PET CT scan at this time and patient will let me know about his decision about restarting chemotherapy in a day or 2.   Visit Diagnosis 1. Malignant neoplasm of body of pancreas (HCC)   2. Carcinoma of pancreas metastatic to lung (HCC)   3. Goals of care, counseling/discussion      Dr. Owens Shark, MD, MPH [facility] [phone] 08/02/2023 8:45 AM

## 2023-08-04 ENCOUNTER — Encounter
Admission: RE | Admit: 2023-08-04 | Discharge: 2023-08-04 | Disposition: A | Discharge status: Home / Self Care | Payer: PPO | Source: Ambulatory Visit | Attending: Oncology | Admitting: Oncology

## 2023-08-04 DIAGNOSIS — C251 Malignant neoplasm of body of pancreas: Secondary | ICD-10-CM | POA: Diagnosis not present

## 2023-08-04 DIAGNOSIS — C259 Malignant neoplasm of pancreas, unspecified: Secondary | ICD-10-CM | POA: Diagnosis not present

## 2023-08-04 DIAGNOSIS — C7802 Secondary malignant neoplasm of left lung: Secondary | ICD-10-CM | POA: Diagnosis not present

## 2023-08-04 DIAGNOSIS — C7800 Secondary malignant neoplasm of unspecified lung: Secondary | ICD-10-CM | POA: Diagnosis not present

## 2023-08-04 LAB — GLUCOSE, CAPILLARY: Glucose-Capillary: 103 mg/dL — ABNORMAL HIGH (ref 70–99)

## 2023-08-04 MED ORDER — FLUDEOXYGLUCOSE F - 18 (FDG) INJECTION
9.9700 | Freq: Once | INTRAVENOUS | Status: AC | PRN
  Administered 2023-08-04: 9.97 via INTRAVENOUS

## 2023-08-06 ENCOUNTER — Telehealth: Payer: Self-pay | Admitting: Oncology

## 2023-08-06 DIAGNOSIS — Z03818 Encounter for observation for suspected exposure to other biological agents ruled out: Secondary | ICD-10-CM | POA: Diagnosis not present

## 2023-08-06 DIAGNOSIS — J069 Acute upper respiratory infection, unspecified: Secondary | ICD-10-CM | POA: Diagnosis not present

## 2023-08-06 NOTE — Telephone Encounter (Signed)
Pt called to report that he thought he has been having "allergy symptoms" since last week. But these symptoms have not cleared up yet. Postnasal drainage, chest congestion, cough.No fever, SOB.  Now he has sore throat, voice has changed.  I suggest him to go to urgent care/walk in clinic to get evaluation. He may have upper respiratory infection. He plans to to go [place] [facility].

## 2023-08-07 ENCOUNTER — Other Ambulatory Visit: Payer: PPO

## 2023-08-11 ENCOUNTER — Ambulatory Visit: Payer: Self-pay | Admitting: Internal Medicine

## 2023-08-11 ENCOUNTER — Telehealth: Payer: Self-pay | Admitting: *Deleted

## 2023-08-11 NOTE — Telephone Encounter (Signed)
Copied from CRM [id number]. Topic: Clinical - Pink Word Triage >> Aug 11, 2023 10:12 AM Tiffany H wrote: Pink Word: Patient called to advise that he has pain in his hand that ranges from mild to major pain at or around a 7/8. Advised that ring and middle finger have started hurting. It radiates from his fingers to the rest of his palm. He has to shake out his hands frequently. This patient advised that the pain started a week or two ago.   Patient wanted to schedule an appointment to discuss obtaining a referral. Please triage pain and assist.  Chief Complaint: left hand pain Symptoms: left hand pain that comes and go: starts with ring & middle finger then radiates to left wrist & at times radiates to other fingers of left hand.  Frequency: 1 to 2 weeks ago  Pertinent Negatives: Patient denies numbness or tingling Disposition: [] ED /[] Urgent Care (no appt availability in office) / [x] Appointment(In office/virtual)/ []  [facility]/ [] Home Care/ [] Refused Recommended Disposition /[] [facility]/ []  Follow-up with PCP Additional Notes: pain comes and goes: pain can get up to 7 or 8/10: pt has not taken anything for the pain yet.  Pt states at times he wakes up with the pain, pain will start with strenuous activity, & sometimes pain just occurs at end of day. Pt sates it feels like joint pain.  Pain moderate at present time.  Reason for Disposition  [1] MODERATE pain (e.g., interferes with normal activities) AND [2] present > 3 days  Answer Assessment - Initial Assessment Questions 1. ONSET: "When did the pain start?"     X 1 or 2 weeks ago 2. LOCATION: "Where is the pain located?"     Left hand: ring & middle finger only 3. PAIN: "How bad is the pain?" (Scale 1-10; or mild, moderate, severe)   - MILD (1-3): doesn't interfere with normal activities   - MODERATE (4-7): interferes with normal activities (e.g., work or school) or awakens from sleep   - SEVERE (8-10): excruciating  pain, unable to use hand at all     Pain comes and goes: beginning or end of day 7 or 8/10 with strenuous activity 4. WORK OR EXERCISE: "Has there been any recent work or exercise that involved this part (i.e., hand or wrist) of the body?"     no 5. CAUSE: "What do you think is causing the pain?"     unknown 6. AGGRAVATING FACTORS: "What makes the pain worse?" (e.g., using computer)     Just waking up in the morning it hurts,  7. OTHER SYMPTOMS: "Do you have any other symptoms?" (e.g., neck pain, swelling, rash, numbness, fever)     Joint pain that radiates to wrist and other fingers 8. PREGNANCY: "Is there any chance you are pregnant?" "When was your last menstrual period?"     N/a  Protocols used: Hand and Wrist Pain-A-AH

## 2023-08-11 NOTE — Telephone Encounter (Signed)
Patient is wanting the pet scan results but it has not got it read yet.I told him that we are national shortage for radiology reading and now the scan are done  around 10 to 12 days. He understands

## 2023-08-11 NOTE — Telephone Encounter (Signed)
Pt is scheduled to see you on Tuesday.

## 2023-08-15 ENCOUNTER — Ambulatory Visit (INDEPENDENT_AMBULATORY_CARE_PROVIDER_SITE_OTHER): Payer: PPO | Admitting: Internal Medicine

## 2023-08-15 ENCOUNTER — Ambulatory Visit (INDEPENDENT_AMBULATORY_CARE_PROVIDER_SITE_OTHER): Payer: PPO

## 2023-08-15 ENCOUNTER — Encounter: Payer: Self-pay | Admitting: Internal Medicine

## 2023-08-15 VITALS — BP 128/74 | HR 82 | Ht 75.0 in | Wt 181.0 lb

## 2023-08-15 DIAGNOSIS — M778 Other enthesopathies, not elsewhere classified: Secondary | ICD-10-CM | POA: Diagnosis not present

## 2023-08-15 DIAGNOSIS — M79642 Pain in left hand: Secondary | ICD-10-CM | POA: Diagnosis not present

## 2023-08-15 DIAGNOSIS — M1812 Unilateral primary osteoarthritis of first carpometacarpal joint, left hand: Secondary | ICD-10-CM | POA: Diagnosis not present

## 2023-08-15 DIAGNOSIS — M7989 Other specified soft tissue disorders: Secondary | ICD-10-CM | POA: Diagnosis not present

## 2023-08-15 DIAGNOSIS — M130 Polyarthritis, unspecified: Secondary | ICD-10-CM

## 2023-08-15 DIAGNOSIS — C251 Malignant neoplasm of body of pancreas: Secondary | ICD-10-CM | POA: Diagnosis not present

## 2023-08-15 DIAGNOSIS — R0982 Postnasal drip: Secondary | ICD-10-CM | POA: Diagnosis not present

## 2023-08-15 LAB — COMPREHENSIVE METABOLIC PANEL
ALT: 14 U/L (ref 0–53)
AST: 18 U/L (ref 0–37)
Albumin: 4 g/dL (ref 3.5–5.2)
Alkaline Phosphatase: 49 U/L (ref 39–117)
BUN: 20 mg/dL (ref 6–23)
CO2: 32 meq/L (ref 19–32)
Calcium: 9.2 mg/dL (ref 8.4–10.5)
Chloride: 99 meq/L (ref 96–112)
Creatinine, Ser: 0.85 mg/dL (ref 0.40–1.50)
GFR: 77.17 mL/min (ref >60.00)
Glucose, Bld: 216 mg/dL — ABNORMAL HIGH (ref 70–99)
Potassium: 4.3 meq/L (ref 3.5–5.1)
Sodium: 139 meq/L (ref 135–145)
Total Bilirubin: 0.5 mg/dL (ref 0.2–1.2)
Total Protein: 6.6 g/dL (ref 6.0–8.3)

## 2023-08-15 LAB — URIC ACID: Uric Acid, Serum: 4.5 mg/dL (ref 4.0–7.8)

## 2023-08-15 LAB — C-REACTIVE PROTEIN: CRP: <1 mg/dL (ref 0.5–20.0)

## 2023-08-15 LAB — SEDIMENTATION RATE: Sed Rate: 10 mm/h (ref 0–20)

## 2023-08-15 MED ORDER — IPRATROPIUM BROMIDE 0.06 % NA SOLN: 2.0000 | Freq: Four times a day (QID) | NASAL | 12 refills | Status: DC

## 2023-08-15 NOTE — Assessment & Plan Note (Signed)
 Aggressive,  with spread to lungs despite surgical resection and chemo with gemcitabine and abraxane.  Currently not in treatment after obtaining a second opinion from  [facility] on the utility of salvage therapy

## 2023-08-15 NOTE — Assessment & Plan Note (Signed)
 He has pain and swelling of the PIP jointns of gfingers 3 snn 4 and negative Tinels/Phalens sign. . Plain films  serologies for inflammatory arthritis ordered.  Advised to limiti tylenol to 2000 mg daily and add voltaren gel

## 2023-08-15 NOTE — Assessment & Plan Note (Signed)
 Post nasal drainage despite use of steroid nasal spray and oral antihistamine.  Trial of atrovent

## 2023-08-15 NOTE — Progress Notes (Signed)
 Subjective:  Patient ID: George Owens, male    DOB: 04/27/1932  Age: 89 y.o. MRN: [id number]  CC: The primary encounter diagnosis was Polyarthritis of hand. Diagnoses of Post-nasal drainage and Malignant neoplasm of body of pancreas [facility]) were also pertinent to this visit.   HPI George Owens presents for  Chief Complaint  Patient presents with   left hand and wrist pain    Khale  is an 89 yr old male with a history of osteoporosis with prior wedge fracture at T9 level,  pancreatic  CA, treated with resection and 4 months of adjuvant gemcitabine, Abraxane chemotherapy in January 2023,   non alcoholic cirrhosis  who presents with sudden onset of pain involving the  joints of finger 3 and 4 of the left hand, which started 2 weeks ago.  The pain is constant but worse in the morning  the pain is localized to the PIP joints .  When the pain is most intense it spreads proximally to  the  wrist.   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  He is frustrated by his inability to procure an appt with me without having to endure 2 separate interrogations by phone both of which were reading from a script .  PND not responding to claritin and steroid sprays  He has been told that his cancer has spread to his lungs despite chemotherapy .  The remaining options for chemotherapy are too difficult to endure   Outpatient Medications Prior to Visit  Medication Sig Dispense Refill   Calcium Carb-Cholecalciferol (CALCIUM 600 + D PO) Take 1 tablet by mouth in the morning.     cholecalciferol (VITAMIN D3) 25 MCG (1000 UNIT) tablet Take 1,000 Units by mouth in the morning.     Cranberry 500 MG CAPS Take 500 mg by mouth in the morning.     denosumab (PROLIA) 60 MG/ML SOSY injection Inject 60 mg into the skin every 6 (six) months. 1 mL 1   hydrocortisone  (ANUSOL-HC) 2.5 % rectal cream Place 1 Application rectally 2 (two) times daily. 30 g 0   lipase/protease/amylase (CREON) 36000 UNITS CPEP capsule Take 2 capsules with the first bite of each meal and 1 capsule with the first bite of each snack 240 capsule 5   loratadine (CLARITIN) 10 MG tablet Take 10 mg by mouth daily as needed for allergies.     metoprolol succinate (TOPROL-XL) 50 MG 24 hr tablet Take 50 mg by mouth daily.     Misc Natural Products (JOINT HEALTH PO) Take 1 tablet by mouth every evening.     naproxen sodium (ALEVE) 220 MG tablet Take 220 mg by mouth daily as needed.     omeprazole (PRILOSEC) 40 MG capsule TAKE 1 CAPSULE (40 MG TOTAL) BY MOUTH DAILY. 90 capsule 1   Polyethyl Glycol-Propyl Glycol (SYSTANE OP) Place 1 drop into both eyes 2 (two) times daily as needed (dry/irritated eyes.).     tamsulosin (FLOMAX) 0.4 MG CAPS capsule TAKE 1 CAPSULE BY MOUTH EVERY DAY 90 capsule 1   vitamin B-12 (CYANOCOBALAMIN) 1000 MCG tablet Take 1,000 mcg by mouth every evening.     acetaminophen (TYLENOL) 500 MG tablet Take 1,000 mg by mouth every 6 (six) hours as needed for moderate pain. (Patient not taking: Reported on 05/12/2023)  Blood Glucose Monitoring Suppl (ONETOUCH VERIO) w/Device KIT Please use to check blood sugars once daily. Dx code: E11.65 (Patient not taking: Reported on 02/23/2023) 1 kit 0   Blood Glucose Monitoring Suppl DEVI 1 each by Does not apply route daily. May substitute to any manufacturer covered by patient's insurance. (Patient not taking: Reported on 02/23/2023) 1 each 0   Glucose Blood (BLOOD GLUCOSE TEST STRIPS) STRP May substitute to any manufacturer covered by patient's insurance.  Use to  check blood sugar once daily at variable times (Patient not taking: Reported on 02/23/2023) 100 strip 0   glucose blood (ONETOUCH VERIO) test strip Use to check blood sugars one time daily. Dx code: E11.65 (Patient not taking: Reported on 05/12/2023) 100 each 2   Lancets (ONETOUCH  ULTRASOFT) lancets Use to check blood sugars once daily. Dx code: E11.65 (Patient not taking: Reported on 05/12/2023) 100 each 2   Facility-Administered Medications Prior to Visit  Medication Dose Route Frequency Provider Last Rate Last Admin   heparin lock flush 100 UNIT/ML injection            heparin lock flush 100 unit/mL  500 Units Intravenous Once [name], MD        Review of Systems;  Patient denies headache, fevers, malaise, unintentional weight loss, skin rash, eye pain, sinus congestion and sinus pain, sore throat, dysphagia,  hemoptysis , cough, dyspnea, wheezing, chest pain, palpitations, orthopnea, edema, abdominal pain, nausea, melena, diarrhea, constipation, flank pain, dysuria, hematuria, urinary  Frequency, nocturia, numbness, tingling, seizures,  Focal weakness, Loss of consciousness,  Tremor, insomnia, depression, anxiety, and suicidal ideation.      Objective:  BP 128/74   Pulse 82   Ht 6\' 3"  (1.905 m)   Wt 181 lb (82.1 kg)   SpO2 98%   BMI 22.62 kg/m   BP Readings from Last 3 Encounters:  08/15/23 128/74  08/02/23 102/66  05/12/23 113/71    Wt Readings from Last 3 Encounters:  08/15/23 181 lb (82.1 kg)  08/02/23 183 lb 8 oz (83.2 kg)  05/12/23 178 lb 8 oz (81 kg)    Physical Exam  Lab Results  Component Value Date   HGBA1C 7.0 (H) 01/19/2023   HGBA1C 6.7 (H) 10/14/2022   HGBA1C 6.8 (H) 07/29/2022    Lab Results  Component Value Date   CREATININE 0.83 04/24/2023   CREATININE 0.82 04/18/2023   CREATININE 0.95 01/19/2023    Lab Results  Component Value Date   WBC 5.8 06/08/2023   HGB 13.4 06/08/2023   HCT 39.6 06/08/2023   PLT 263 06/08/2023   GLUCOSE 143 (H) 04/24/2023   CHOL 181 07/29/2022   TRIG 122.0 07/29/2022   HDL 42.50 07/29/2022   LDLDIRECT 108.0 07/29/2022   LDLCALC 115 (H) 07/29/2022   ALT 37 04/24/2023   AST 35 04/24/2023   NA 136 04/24/2023   K 3.7 04/24/2023   CL 100 04/24/2023   CREATININE 0.83 04/24/2023    BUN 27 (H) 04/24/2023   CO2 28 04/24/2023   TSH 5.68 (H) 07/29/2022   PSA 0.51 04/27/2020   INR 1.0 11/11/2021   HGBA1C 7.0 (H) 01/19/2023    No results found.  Assessment & Plan:  .Polyarthritis of hand Assessment & Plan: He has pain and swelling of the PIP jointns of gfingers 3 snn 4 and negative Tinels/Phalens sign. . Plain films  serologies for inflammatory arthritis ordered.  Advised to limiti tylenol to 2000 mg daily and add voltaren gel  Orders: -  Sedimentation rate -     C-reactive protein -     ANA Screen,IFA,Reflex Titer/Pattern,Reflex Mplx 11 Ab Cascade with IdentRA -     Rheumatoid factor -     Uric acid -     Comprehensive metabolic panel -     DG Hand Complete Left; Future  Post-nasal drainage Assessment & Plan: Post nasal drainage despite use of steroid nasal spray and oral antihistamine.  Trial of atrovent    Malignant neoplasm of body of pancreas [name]) Assessment & Plan: Aggressive,  with spread to lungs despite surgical resection and chemo with gemcitabine and abraxane.  Currently not in treatment after obtaining a second opinion from  [facility] on the utility of salvage therapy    Other orders -     Ipratropium Bromide; Place 2 sprays into both nostrils 4 (four) times daily.  Dispense: 15 mL; Refill: 12     I spent 34 minutes on the day of this face to face encounter reviewing patient's  most recent visit with oncology, ,  prior relevant surgical and non surgical procedures, recent  labs and imaging studies, counseling on weight management,  reviewing the assessment and plan with patient, and post visit ordering and reviewing of  diagnostics and therapeutics with patient  .   Follow-up: No follow-ups on file.   [name], MD

## 2023-08-15 NOTE — Patient Instructions (Signed)
 Trial of atrovent nasal spray for the runny nose.  NOT A STEROID,  JUST HELPS DRY THINGS UP.[name]  CAN BE USED EVERY 6 HOURS    X RAYS AND LABS TO DETERMINE THE CAUSE OF YOUR HAND PAIN   OK TO USE 1000 MG TYLENOL EVERY 12 HOURS AND/OR VOLTAREN GEL UP TO 4 TIMES DAILY (available otc)

## 2023-08-16 ENCOUNTER — Encounter: Payer: Self-pay | Admitting: Internal Medicine

## 2023-08-18 LAB — ANA SCREEN,IFA,REFLEX TITER/PATTERN,REFLEX MPLX 11 AB CASCADE
Anti Nuclear Antibody (ANA): NEGATIVE
Cyclic Citrullin Peptide Ab: <16 U
MUTATED CITRULLINATED VIMENTIN (MCV) AB: <20 U/mL (ref <20)
Rheumatoid fact SerPl-aCnc: <10 [IU]/mL (ref <14)

## 2023-08-21 ENCOUNTER — Telehealth: Payer: Self-pay

## 2023-08-21 NOTE — Telephone Encounter (Signed)
 Patient called and left a VM on 08/21/23 stating that it has been 2 weeks and he still hadn't heard any results from his CT and would like for Dr. Smith Robert to give his results over the phone instead of coming in for an appointment.  I called patient back on 08/21/23 to let him know that Dr. Smith Robert did receive his results, and that she would prefer him to come in so they can discuss results together.

## 2023-08-23 ENCOUNTER — Inpatient Hospital Stay: Attending: Oncology | Admitting: Oncology

## 2023-08-23 ENCOUNTER — Other Ambulatory Visit: Payer: Self-pay

## 2023-08-23 VITALS — BP 115/79 | HR 72 | Temp 97.5°F | Resp 18 | Ht 75.0 in | Wt 181.4 lb

## 2023-08-23 DIAGNOSIS — C259 Malignant neoplasm of pancreas, unspecified: Secondary | ICD-10-CM

## 2023-08-23 DIAGNOSIS — C251 Malignant neoplasm of body of pancreas: Secondary | ICD-10-CM | POA: Diagnosis not present

## 2023-08-23 DIAGNOSIS — C771 Secondary and unspecified malignant neoplasm of intrathoracic lymph nodes: Secondary | ICD-10-CM | POA: Insufficient documentation

## 2023-08-23 DIAGNOSIS — C7802 Secondary malignant neoplasm of left lung: Secondary | ICD-10-CM | POA: Insufficient documentation

## 2023-08-23 DIAGNOSIS — C7972 Secondary malignant neoplasm of left adrenal gland: Secondary | ICD-10-CM | POA: Insufficient documentation

## 2023-08-23 DIAGNOSIS — C7800 Secondary malignant neoplasm of unspecified lung: Secondary | ICD-10-CM | POA: Diagnosis not present

## 2023-08-23 DIAGNOSIS — Z9221 Personal history of antineoplastic chemotherapy: Secondary | ICD-10-CM | POA: Diagnosis not present

## 2023-08-23 DIAGNOSIS — Z7189 Other specified counseling: Secondary | ICD-10-CM | POA: Diagnosis not present

## 2023-08-24 ENCOUNTER — Telehealth: Payer: Self-pay | Admitting: *Deleted

## 2023-08-24 NOTE — Telephone Encounter (Signed)
 He says that the PET showed 3 new places of the lung, He does not want the MRI. 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 He is waiting for [name] to come back from his trip and then after you get back from your vacation. But as of now it is no treatment. You do not have to call him he just wants you to know the above

## 2023-08-24 NOTE — Progress Notes (Signed)
 Hematology/Oncology Consult note [name]  [phone] [phone]  Patient Care Team: [name], MD as PCP - General (Internal Medicine) [name], RN as Oncology Nurse Navigator [name], MD as Consulting Physician (Oncology)   Name of the patient: George Owens  [id number]  05-Nov-1931   Date of visit: 08/24/23  Diagnosis-metastatic pancreatic cancer  Chief complaint/ Reason for visit-discuss further management of pancreatic cancer  Heme/Onc history: patient is a 89 year old male with a past medical history significant for GERD, hyperlipidemia, cirrhosis.  He was having symptoms of Abdominal pain and reflux and therefore underwent a CT abdomen and pelvis with contrast on 09/11/2020 which showed a hypointense ill-defined mass in the pancreatic body up to 25 mm.  No extrapancreatic infiltrative density about the aorta or proximal [name].  No local regional adenopathy or distant metastatic disease.  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  This was followed by an EUS at [name] which showed a hypoechoic mass measuring 1.8 x 1.6 cm in the pancreatic body.  Endosonographic borders poorly defined.  Upstream pancreatic duct dilatation.  No abnormal appearing lymph nodes.  Endosonographic imaging of the liver showed no lesion.  Biopsy showed adenocarcinoma.   PET scan showed 2.7 x 2 cm ovoid mass in the body of the pancreas with an SUV of 7.1.  18 mm hypermetabolic nodule in the isthmus of the thyroid gland.  No evidence of local regional adenopathy or distant metastatic disease.   Patient was evaluated by Dr. [name] at [facility] for consideration of surgery and has been deemed to be upfront resectable surgical candidate.  However plan was to offer him neoadjuvant chemotherapy for 3 months to control micrometastatic disease before proceeding with definitive surgery.  If ultimately patient decides not to proceed with surgery then radiation at  [name] also remains an option.   Patient received 8 doses of gemcitabine and Abraxane chemotherapy 1 week on 1 week off due to neutropenia and thrombocytopenia. Patient underwent laparoscopy surgery by Dr. [name] at [name].  Final pathology showed 3 cm grade 3 poorly differentiated adenocarcinoma of the pancreas with negative margins.  15 lymph nodes negative for malignancy.  Absent chemotherapy response with extensive residual cancer and no evidence of tumor regression.  Perineural invasion identified lymphovascular invasion not identified.  Patient completed 4 months of adjuvant gemcitabine Abraxane chemotherapy in January 2023.    Patient noted to Have bilateral lung nodules that were subcentimeter dating back to April 2023 and since then they have gradually grown in size.  He had bronchoscopy and attempts to biopsy twice in May 2024 and October 2024 and results have been negative for malignancy.   Patient underwent CT-guided Biopsy at [name] which was consistent with adenocarcinoma although based on the pathology it was not ruled out if it was primary pancreatic versus mucinous lung.  NGS testing was done both on original pancreatic cancer specimen as well as lung biopsy specimen. Variants in KRAS, CDKN2A, and TP53 were detected, all consistent with that reported in this individual's previous sample ([id number], [id number]). The KRAS p.G12R variant, a cancer hotspot mutation, results in gain-of-function and is frequently found pancreatic adenocarcinoma.  Based on this it was concluded that the lung nodules are consistent with metastatic pancreatic cancer.  Microsatellite stable.  Low tumor mutational burden.  He was not deemed to be a candidate for clinical trial at [name].  Risks and benefits of modified FOLFIRINOX chemotherapy discussed with the patient.  Patient has chosen  not to proceed with any treatment for his metastatic pancreatic cancer    Interval history-overall patient is doing well.   Appetite and weight have remained stable.  He remains active and goes for his daily walks.  Denies any significant pain  ECOG PS- 1 Pain scale- 0  Review of systems- Review of Systems  Constitutional:  Negative for chills, fever, malaise/fatigue and weight loss.  HENT:  Negative for congestion, ear discharge and nosebleeds.   Eyes:  Negative for blurred vision.  Respiratory:  Negative for cough, hemoptysis, sputum production, shortness of breath and wheezing.   Cardiovascular:  Negative for chest pain, palpitations, orthopnea and claudication.  Gastrointestinal:  Negative for abdominal pain, blood in stool, constipation, diarrhea, heartburn, melena, nausea and vomiting.  Genitourinary:  Negative for dysuria, flank pain, frequency, hematuria and urgency.  Musculoskeletal:  Negative for back pain, joint pain and myalgias.  Skin:  Negative for rash.  Neurological:  Negative for dizziness, tingling, focal weakness, seizures, weakness and headaches.  Endo/Heme/Allergies:  Does not bruise/bleed easily.  Psychiatric/Behavioral:  Negative for depression and suicidal ideas. The patient does not have insomnia.       Allergies  Allergen Reactions   Kenalog [Triamcinolone Acetonide]     Blindness X 3 Days injection   Tramadol Hcl     Dizzy and Weakness     Past Medical History:  Diagnosis Date   Adenocarcinoma of pancreas (HCC)    a.) stage 1B poorly differentiated adenocarcinoma of the pancreas (grade 3) s/p distal pancreatectomy and splenectomy (negative margins); 15 lymph nodes negative for malignancy   Allergy    Aortic atherosclerosis (HCC)    Basal cell carcinoma    right temple 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   Colon polyps    Coronary artery calcification seen on CT scan    COVID-19 virus infection 03/16/2020   Diastolic dysfunction 10/09/2020   a.) TTE 10/09/2020: EF 60-65%,  no RWMAs, normal RVSF, Ao root 39 mm, G1DD   GERD (gastroesophageal reflux disease)    Hemorrhoids    Hepatic steatosis    Hyperlipidemia    Hypertension    Leg cramps    Lung nodules 10/07/2021   Neuropathy    Osteoporosis    a.) on denosumab injections   Paroxysmal A-fib (HCC) 02/25/2021   a.) developed postop following distal pancreatectomy and splenectomy; b.) CHA2DS2-VASc = 4 (age x2, HTN, vascular disease history); b.) cardiac rate/rhythm maintained on oral metoprolol succinate; no chronic OAC   Pneumonia 2022   Prostate cancer (HCC) 2006   Renal artery aneurysm (HCC)    Secondary diabetes (HCC)    does not check blood sugar   Thoracic ascending aortic aneurysm (HCC) 04/29/2020   a.) CT chest 04/29/2020: 4.7 cm; b.) CT CAP 08/04/2021: 4.6 cm; c.) CT chest 10/08/2021: 4.5 cm; d.) CT chest 02/25/2022: 4.6 cm; e.) CT chest 10/07/2022: 4.5 cm   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     Past Surgical History:  Procedure Laterality Date   BRONCHIAL BRUSHINGS  11/01/2022   Procedure: BRONCHIAL BRUSHINGS;  Surgeon: Raechel Chute, MD;  Location: [facility];  Service: Pulmonary;;   BRONCHIAL NEEDLE ASPIRATION BIOPSY  11/01/2022   Procedure: BRONCHIAL NEEDLE ASPIRATION BIOPSIES;  Surgeon: Raechel Chute, MD;  Location: [facility];  Service: Pulmonary;;   BRONCHIAL NEEDLE ASPIRATION BIOPSY  04/18/2023   Procedure: BRONCHIAL NEEDLE ASPIRATION BIOPSIES;  Surgeon: Raechel Chute,  MD;  Location: [facility];  Service: Pulmonary;;   BRONCHIAL WASHINGS  11/01/2022   Procedure: BRONCHIAL WASHINGS;  Surgeon: Raechel Chute, MD;  Location: [facility];  Service: Pulmonary;;   CATARACT EXTRACTION Right    CATARACT EXTRACTION W/PHACO Left 12/26/2017   Procedure: CATARACT EXTRACTION PHACO AND INTRAOCULAR LENS PLACEMENT (IOC)  COMPLICATED LEFT;  Surgeon: Lockie Mola, MD;  Location: [facility];  Service: Ophthalmology;  Laterality: Left;  MALYUGIN VISION BLUE   CHOLECYSTECTOMY   03/01/2023   at [facility]   colonoscopy     ESOPHAGOGASTRODUODENOSCOPY (EGD) WITH PROPOFOL     EYE MUSCLE SURGERY Left    IR KYPHO THORACIC WITH BONE BIOPSY  11/11/2021   LAPAROSCOPIC SPLENECTOMY  02/25/2021   PORTA CATH INSERTION N/A 10/19/2020   Procedure: PORTA CATH INSERTION;  Surgeon: Annice Needy, MD;  Location: [facility] INVASIVE CV LAB;  Service: Cardiovascular;  Laterality: N/A;   TONSILLECTOMY AND ADENOIDECTOMY     XI ROBOTIC ASSISTED LAPAROSCOPIC DISTAL PANCREATECTOMY  02/25/2021    Social History   Socioeconomic History   Marital status: Married    Spouse name: Eloise   Number of children: 4   Years of education: Not on file   Highest education level: Not on file  Occupational History   Occupation: retired  Tobacco Use   Smoking status: Never   Smokeless tobacco: Never  Vaping Use   Vaping status: Never Used  Substance and Sexual Activity   Alcohol use: No   Drug use: No   Sexual activity: Yes    Partners: Female  Other Topics Concern   Not on file  Social History Narrative   Lives with wife in a 5 story retirement home.  He lives on the first floor.  4 year college degree.  Retired Corporate investment banker.  Married 4 years to his first wife's sister.     Social Drivers of Corporate investment banker Strain: Low Risk  (04/13/2020)   Overall Financial Resource Strain (CARDIA)    Difficulty of Paying Living Expenses: Not hard at all  Food Insecurity: No Food Insecurity (01/13/2023)   Hunger Vital Sign    Worried About Running Out of Food in the Last Year: Never true    Ran Out of Food in the Last Year: Never true  Transportation Needs: No Transportation Needs (01/13/2023)   PRAPARE - Administrator, Civil Service (Medical): No    Lack of Transportation (Non-Medical): No  Physical Activity: Sufficiently Active (04/10/2019)   Exercise Vital Sign    Days of Exercise per Week: 5 days    Minutes of Exercise per Session: 30 min  Stress: No Stress Concern Present  (04/13/2020)   [employer] of Occupational Health - Occupational Stress Questionnaire    Feeling of Stress : Not at all  Social Connections: Unknown (04/13/2020)   Social Connection and Isolation Panel [NHANES]    Frequency of Communication with Friends and Family: Not on file    Frequency of Social Gatherings with Friends and Family: Not on file    Attends Religious Services: Not on file    Active Member of Clubs or Organizations: Not on file    Attends Banker Meetings: Not on file    Marital Status: Married  Intimate Partner Violence: Not At Risk (01/10/2023)   Humiliation, Afraid, Rape, and Kick questionnaire    Fear of Current or Ex-Partner: No    Emotionally Abused: No    Physically Abused: No    Sexually Abused:  No    Family History  Problem Relation Age of Onset   Stroke Mother    Arthritis Father    Heart disease Father    Brain cancer Brother        1/2 brother (possibly related to mother NOT related to patient)   Cancer Cousin        unk type   Lymphoma Cousin        vs leukemia   Colon cancer Neg Hx    Esophageal cancer Neg Hx    Pancreatic cancer Neg Hx    Stomach cancer Neg Hx    Liver disease Neg Hx      Current Outpatient Medications:    Calcium Carb-Cholecalciferol (CALCIUM 600 + D PO), Take 1 tablet by mouth in the morning., Disp: , Rfl:    cholecalciferol (VITAMIN D3) 25 MCG (1000 UNIT) tablet, Take 1,000 Units by mouth in the morning., Disp: , Rfl:    Cranberry 500 MG CAPS, Take 500 mg by mouth in the morning., Disp: , Rfl:    denosumab (PROLIA) 60 MG/ML SOSY injection, Inject 60 mg into the skin every 6 (six) months., Disp: 1 mL, Rfl: 1   ipratropium (ATROVENT) 0.06 % nasal spray, Place 2 sprays into both nostrils 4 (four) times daily., Disp: 15 mL, Rfl: 12   lipase/protease/amylase (CREON) 36000 UNITS CPEP capsule, Take 2 capsules with the first bite of each meal and 1 capsule with the first bite of each snack, Disp: 240 capsule,  Rfl: 5   loratadine (CLARITIN) 10 MG tablet, Take 10 mg by mouth daily as needed for allergies., Disp: , Rfl:    metoprolol succinate (TOPROL-XL) 50 MG 24 hr tablet, Take 50 mg by mouth daily., Disp: , Rfl:    Misc Natural Products (JOINT HEALTH PO), Take 1 tablet by mouth every evening., Disp: , Rfl:    naproxen sodium (ALEVE) 220 MG tablet, Take 220 mg by mouth daily as needed., Disp: , Rfl:    omeprazole (PRILOSEC) 40 MG capsule, TAKE 1 CAPSULE (40 MG TOTAL) BY MOUTH DAILY., Disp: 90 capsule, Rfl: 1   Polyethyl Glycol-Propyl Glycol (SYSTANE OP), Place 1 drop into both eyes 2 (two) times daily as needed (dry/irritated eyes.)., Disp: , Rfl:    tamsulosin (FLOMAX) 0.4 MG CAPS capsule, TAKE 1 CAPSULE BY MOUTH EVERY DAY, Disp: 90 capsule, Rfl: 1   vitamin B-12 (CYANOCOBALAMIN) 1000 MCG tablet, Take 1,000 mcg by mouth every evening., Disp: , Rfl:    acetaminophen (TYLENOL) 500 MG tablet, Take 1,000 mg by mouth every 6 (six) hours as needed for moderate pain. (Patient not taking: Reported on 05/12/2023), Disp: , Rfl:    glucose blood (ONETOUCH VERIO) test strip, Use to check blood sugars one time daily. Dx code: E11.65 (Patient not taking: Reported on 05/12/2023), Disp: 100 each, Rfl: 2   hydrocortisone (ANUSOL-HC) 2.5 % rectal cream, Place 1 Application rectally 2 (two) times daily. (Patient not taking: Reported on 08/23/2023), Disp: 30 g, Rfl: 0 No current facility-administered medications for this visit.  Facility-Administered Medications Ordered in Other Visits:    heparin lock flush 100 UNIT/ML injection, , , ,    heparin lock flush 100 unit/mL, 500 Units, Intravenous, Once, [name], MD  Physical exam:  Vitals:   08/23/23 1530 08/23/23 1608  BP: 115/79   Pulse: 72   Resp: 18   Temp: (!) 97.5 F (36.4 C)   TempSrc: Tympanic   Weight:  181 lb 6.4 oz (82.3 kg)  Height:  6\' 3"  (1.905 m)   Physical Exam Cardiovascular:     Rate and Rhythm: Normal rate and regular rhythm.     Heart  sounds: Normal heart sounds.  Pulmonary:     Effort: Pulmonary effort is normal.     Breath sounds: Normal breath sounds.  Skin:    General: Skin is warm and dry.  Neurological:     Mental Status: He is alert and oriented to person, place, and time.         Latest Ref Rng & Units 08/15/2023   10:59 AM  CMP  Glucose 70 - 99 mg/dL 130   BUN 6 - 23 mg/dL 20   Creatinine 8.65 - 1.50 mg/dL 7.84   Sodium 696 - 295 mEq/L 139   Potassium 3.5 - 5.1 mEq/L 4.3   Chloride 96 - 112 mEq/L 99   CO2 19 - 32 mEq/L 32   Calcium 8.4 - 10.5 mg/dL 9.2   Total Protein 6.0 - 8.3 g/dL 6.6   Total Bilirubin 0.2 - 1.2 mg/dL 0.5   Alkaline Phos 39 - 117 U/L 49   AST 0 - 37 U/L 18   ALT 0 - 53 U/L 14       Latest Ref Rng & Units 06/08/2023   11:37 AM  CBC  WBC 3.4 - 10.8 x10E3/uL 5.8   Hemoglobin 13.0 - 17.7 g/dL 28.4   Hematocrit 13.2 - 51.0 % 39.6   Platelets 150 - 450 x10E3/uL 263     No images are attached to the encounter.  NM PET Image Restag (PS) Skull Base To Thigh Result Date: 08/21/2023 CLINICAL DATA:  Subsequent treatment strategy for pancreatic cancer. EXAM: NUCLEAR MEDICINE PET SKULL BASE TO THIGH TECHNIQUE: 10.0 mCi F-18 FDG was injected intravenously. Full-ring PET imaging was performed from the skull base to thigh after the radiotracer. CT data was obtained and used for attenuation correction and anatomic localization. Fasting blood glucose: 103 mg/dl COMPARISON:  44/06/270. FINDINGS: Mediastinal blood pool activity: SUV max 2.7 Liver activity: SUV max NA NECK: No abnormal hypermetabolism. Incidental CT findings: None. CHEST: Similar metabolic thyroid isthmus nodule measures 2.0 cm, SUV max 7.1. New hypermetabolic subcarinal lymph node measures 7 mm, SUV max 5.3. Increasingly hypermetabolic pulmonary metastases. Index 1.4 cm nodule in the apical left upper lobe (6/44), SUV max 5.6, previously 4.1. Incidental CT findings: Right IJ Port-A-Cath terminates in the right atrium.  Atherosclerotic calcification of the aorta, aortic valve and coronary arteries. Heart is at the upper limits of normal in size to mildly enlarged. No pericardial or pleural effusion. ABDOMEN/PELVIS: New hypermetabolic left adrenal nodule measures 12 mm, SUV max 6.6. No additional abnormal hypermetabolism. Incidental CT findings: Small low-attenuation lesion off the left kidney, too small to characterize. No specific follow-up necessary. SKELETON: New hypermetabolic lesions in the left iliac wing with index medial lytic lesion measuring approximately 1.9 cm (6/132), SUV max 8.4. Incidental CT findings: Osteopenia. Degenerative changes in the spine. Thoracic vertebral body augmentation. IMPRESSION: 1. Progressive metastatic disease as evidenced by increasingly hypermetabolic pulmonary metastases, new hypermetabolic subcarinal lymph node, new hypermetabolic left adrenal metastasis and new hypermetabolic left iliac wing metastases. 2. Chronically hypermetabolic thyroid nodule. 3. Aortic atherosclerosis (ICD10-I70.0). Coronary artery calcification. Electronically Signed   By: Leanna Battles M.D.   On: 08/21/2023 10:03   DG Hand Complete Left Result Date: 08/15/2023 CLINICAL DATA:  Pain and swelling of the third and fourth PIP joints. EXAM: LEFT HAND - COMPLETE 3+ VIEW COMPARISON:  None  Available. FINDINGS: Mildly decreased bone mineralization. Neutral ulnar variance. Mild-to-moderate thumb carpometacarpal joint space narrowing with subchondral sclerosis and mild peripheral osteophytosis. Mild to moderate thumb interphalangeal and mild second DIP joint space narrowing and peripheral spurring. No acute fracture or dislocation. 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 No significant osteoarthritis of the third and fourth PIP joints. Electronically Signed   By: Neita Garnet M.D.   On: 08/15/2023 13:34     Assessment and plan- Patient is a 89 y.o. male with  metastatic pancreatic cancer here to discuss further management  Patient has undergone surgery for stage I pancreatic cancer and perioperative chemotherapy in 2022 for 6 months with gemcitabine and Abraxane.  He was noted to have gradually increasing lung nodules which started sometime in April 2023 and have been gradually growing since then.  He had 2 attempts at biopsy at [facility] which were nondiagnostic.  He was then referred to [facility] for second opinion and underwent bronchoscopy there which was positive for pancreatic cancer.We subsequently did a PET CT scan on 08/04/2023 which shows further progression of disease.  Lung metastases are now increasingly hypermetabolic and there is evidence of hypermetabolic subcarinal adenopathy, hypermetabolic left adrenal metastases as well as left iliac wing metastases.  So far patient had not been started on any treatment for metastatic pancreatic cancer in the absence of biopsy-proven disease.  However now in the light of further progression we discussed that if patient desires treatment it would be in his best interest to do it sooner instead of waiting for further progression.  I recommend starting off with palliative FOLFIRI chemotherapy every 2 weeks until progression or toxicity.  He has received gemcitabine Abraxane chemotherapy in the past and within a year of stopping chemotherapy had developed lung nodules.  Discussed risks and benefits of chemotherapy including all but not limited to nausea vomiting low blood counts risk of infections and hospitalizations.  Treatment will be given with a palliative intent.  Without chemotherapy we are looking at a potential life expectancy of less than 6 months.  With treatment it could be up to a year.  Patient would like to think about all of this and let us know if he wants to proceed with treatment or not.  Follow-up with me to be decided based on that.   Visit Diagnosis 1. Malignant neoplasm of body of pancreas (HCC)   2.  Carcinoma of pancreas metastatic to lung (HCC)   3. Goals of care, counseling/discussion      Dr. Owens Shark, MD, MPH [facility] at [facility] [phone] 08/24/2023 10:42 AM

## 2023-08-24 NOTE — Telephone Encounter (Signed)
 How is DR. [name] going to help him? I will call him after I get back from my trip and see which way he wants to proceed

## 2023-09-06 ENCOUNTER — Telehealth: Payer: Self-pay

## 2023-09-06 NOTE — Telephone Encounter (Signed)
 Patient called requesting that he would like to have a phone or video visit to discuss his appointment and pet scan he had with Dr. Hulen Luster from [facility].

## 2023-09-07 NOTE — Telephone Encounter (Signed)
 Video visit tomorrow last patient appt

## 2023-09-08 ENCOUNTER — Inpatient Hospital Stay (HOSPITAL_BASED_OUTPATIENT_CLINIC_OR_DEPARTMENT_OTHER): Admitting: Oncology

## 2023-09-08 VITALS — Wt 171.0 lb

## 2023-09-08 DIAGNOSIS — Z7189 Other specified counseling: Secondary | ICD-10-CM

## 2023-09-08 DIAGNOSIS — C251 Malignant neoplasm of body of pancreas: Secondary | ICD-10-CM

## 2023-09-09 NOTE — Progress Notes (Signed)
 I connected with George Owens on 09/09/23 at  3:30 PM EDT by video enabled telemedicine visit and verified that I am speaking with the correct person using two identifiers.   I discussed the limitations, risks, security and privacy concerns of performing an evaluation and management service by telemedicine and the availability of in-person appointments. I also discussed with the patient that there may be a patient responsible charge related to this service. The patient expressed understanding and agreed to proceed.  Other persons participating in the visit and their role in the encounter:  patients wife  Patient's location:  home Provider's location:  work  Stage manager Complaint:  discuss further management of metastatic pancreatic cancer  History of present illness: patient is a 89 year old male with a past medical history significant for GERD, hyperlipidemia, cirrhosis.  He was having symptoms of Abdominal pain and reflux and therefore underwent a CT abdomen and pelvis with contrast on 09/11/2020 which showed a hypointense ill-defined mass in the pancreatic body up to 25 mm.  No extrapancreatic infiltrative density about the aorta or proximal [name].  No local regional adenopathy or distant metastatic disease.  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  This was followed by an EUS at [facility] which showed a hypoechoic mass measuring 1.8 x 1.6 cm in the pancreatic body.  Endosonographic borders poorly defined.  Upstream pancreatic duct dilatation.  No abnormal appearing lymph nodes.  Endosonographic imaging of the liver showed no lesion.  Biopsy showed adenocarcinoma.   PET scan showed 2.7 x 2 cm ovoid mass in the body of the pancreas with an SUV of 7.1.  18 mm hypermetabolic nodule in the isthmus of the thyroid gland.  No evidence of local regional adenopathy or distant metastatic disease.   Patient was evaluated by Dr. [name] at [facility] for consideration of surgery and has been deemed to be upfront resectable  surgical candidate.  However plan was to offer him neoadjuvant chemotherapy for 3 months to control micrometastatic disease before proceeding with definitive surgery.  If ultimately patient decides not to proceed with surgery then radiation at [facility] also remains an option.   Patient received 8 doses of gemcitabine and Abraxane chemotherapy 1 week on 1 week off due to neutropenia and thrombocytopenia. Patient underwent laparoscopy surgery by Dr. [name] at [facility].  Final pathology showed 3 cm grade 3 poorly differentiated adenocarcinoma of the pancreas with negative margins.  15 lymph nodes negative for malignancy.  Absent chemotherapy response with extensive residual cancer and no evidence of tumor regression.  Perineural invasion identified lymphovascular invasion not identified.  Patient completed 4 months of adjuvant gemcitabine Abraxane chemotherapy in January 2023.    Patient noted to Have bilateral lung nodules that were subcentimeter dating back to April 2023 and since then they have gradually grown in size.  He had bronchoscopy and attempts to biopsy twice in May 2024 and October 2024 and results have been negative for malignancy.   Patient underwent CT-guided Biopsy at [facility] which was consistent with adenocarcinoma although based on the pathology it was not ruled out if it was primary pancreatic versus mucinous lung.  NGS testing was done both on original pancreatic cancer specimen as well as lung biopsy specimen. Variants in KRAS, CDKN2A, and TP53 were detected, all consistent with that reported in this individual's previous sample ([id number], [id number]). The KRAS p.G12R variant, a cancer hotspot mutation, results in gain-of-function and is frequently found pancreatic adenocarcinoma.  Based on this it was concluded that the lung nodules are consistent  with metastatic pancreatic cancer.  Microsatellite stable.  Low tumor mutational burden.  He was not deemed to be a candidate for clinical trial  at [facility].  Risks and benefits of modified FOLFIRINOX chemotherapy discussed with the patient.  Patient has chosen not to proceed with any treatment for his metastatic pancreatic cancer  Interval history : Patient is doing well so far and appetite and weight have remained stable.  He has occasional diarrhea.  He still walks about a mile to a mile and a half every day denies any significant pain   Review of Systems  Constitutional:  Negative for chills, fever, malaise/fatigue and weight loss.  HENT:  Negative for congestion, ear discharge and nosebleeds.   Eyes:  Negative for blurred vision.  Respiratory:  Negative for cough, hemoptysis, sputum production, shortness of breath and wheezing.   Cardiovascular:  Negative for chest pain, palpitations, orthopnea and claudication.  Gastrointestinal:  Negative for abdominal pain, blood in stool, constipation, diarrhea, heartburn, melena, nausea and vomiting.  Genitourinary:  Negative for dysuria, flank pain, frequency, hematuria and urgency.  Musculoskeletal:  Negative for back pain, joint pain and myalgias.  Skin:  Negative for rash.  Neurological:  Negative for dizziness, tingling, focal weakness, seizures, weakness and headaches.  Endo/Heme/Allergies:  Does not bruise/bleed easily.  Psychiatric/Behavioral:  Negative for depression and suicidal ideas. The patient does not have insomnia.     Allergies  Allergen Reactions   Kenalog [Triamcinolone Acetonide]     Blindness X 3 Days injection   Tramadol Hcl     Dizzy and Weakness    Past Medical History:  Diagnosis Date   Adenocarcinoma of pancreas (HCC)    a.) stage 1B poorly differentiated adenocarcinoma of the pancreas (grade 3) s/p distal pancreatectomy and splenectomy (negative margins); 15 lymph nodes negative for malignancy   Allergy    Aortic atherosclerosis (HCC)    Basal cell carcinoma    right temple [name] 12/2018   BPH (benign prostatic hyperplasia)    Chicken pox     Choledocholithiasis    Cirrhosis of liver (HCC) 05/12/2020   Colon polyps    Coronary artery calcification seen on CT scan    COVID-19 virus infection 03/16/2020   Diastolic dysfunction 10/09/2020   a.) TTE 10/09/2020: EF 60-65%, no RWMAs, normal RVSF, Ao root 39 mm, G1DD   GERD (gastroesophageal reflux disease)    Hemorrhoids    Hepatic steatosis    Hyperlipidemia    Hypertension    Leg cramps    Lung nodules 10/07/2021   Neuropathy    Osteoporosis    a.) on denosumab injections   Paroxysmal A-fib (HCC) 02/25/2021   a.) developed postop following distal pancreatectomy and splenectomy; b.) CHA2DS2-VASc = 4 (age x2, HTN, vascular disease history); b.) cardiac rate/rhythm maintained on oral metoprolol succinate; no chronic OAC   Pneumonia 2022   Prostate cancer (HCC) 2006   Renal artery aneurysm (HCC)    Secondary diabetes (HCC)    does not check blood sugar   Thoracic ascending aortic aneurysm (HCC) 04/29/2020   a.) CT chest 04/29/2020: 4.7 cm; b.) CT CAP 08/04/2021: 4.6 cm; c.) CT chest 10/08/2021: 4.5 cm; d.) CT chest 02/25/2022: 4.6 cm; e.) CT chest 10/07/2022: 4.5 cm   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    Past Surgical History:  Procedure Laterality Date   BRONCHIAL BRUSHINGS  11/01/2022   Procedure: BRONCHIAL BRUSHINGS;  Surgeon: Raechel Chute, MD;  Location: [facility] ENDOSCOPY;  Service: Pulmonary;;   BRONCHIAL NEEDLE ASPIRATION BIOPSY  11/01/2022   Procedure: BRONCHIAL NEEDLE ASPIRATION BIOPSIES;  Surgeon: Raechel Chute, MD;  Location: [facility] ENDOSCOPY;  Service: Pulmonary;;   BRONCHIAL NEEDLE ASPIRATION BIOPSY  04/18/2023   Procedure: BRONCHIAL NEEDLE ASPIRATION BIOPSIES;  Surgeon: Raechel Chute, MD;  Location: [facility] ENDOSCOPY;  Service: Pulmonary;;   BRONCHIAL WASHINGS  11/01/2022   Procedure: BRONCHIAL WASHINGS;  Surgeon: Raechel Chute, MD;  Location[facility] ENDOSCOPY;  Service: Pulmonary;;   CATARACT EXTRACTION Right    CATARACT EXTRACTION W/PHACO Left 12/26/2017   Procedure:  CATARACT EXTRACTION PHACO AND INTRAOCULAR LENS PLACEMENT (IOC)  COMPLICATED LEFT;  Surgeon: Lockie Mola, MD;  Location: [facility]ERY [facility];  Service: Ophthalmology;  Laterality: Left;  MALYUGIN VISION BLUE   CHOLECYSTECTOMY  03/01/2023   at [facility]   colonoscopy     ESOPHAGOGASTRODUODENOSCOPY (EGD) WITH PROPOFOL     EYE MUSCLE SURGERY Left    IR KYPHO THORACIC WITH BONE BIOPSY  11/11/2021   LAPAROSCOPIC SPLENECTOMY  02/25/2021   PORTA CATH INSERTION N/A 10/19/2020   Procedure: PORTA CATH INSERTION;  Surgeon: Annice Needy, MD;  Location: [facility] INVASIVE CV LAB;  Service: Cardiovascular;  Laterality: N/A;   TONSILLECTOMY AND ADENOIDECTOMY     XI ROBOTIC ASSISTED LAPAROSCOPIC DISTAL PANCREATECTOMY  02/25/2021    Social History   Socioeconomic History   Marital status: Married    Spouse name: Eloise   Number of children: 4   Years of education: Not on file   Highest education level: Not on file  Occupational History   Occupation: retired  Tobacco Use   Smoking status: Never   Smokeless tobacco: Never  Vaping Use   Vaping status: Never Used  Substance and Sexual Activity   Alcohol use: No   Drug use: No   Sexual activity: Yes    Partners: Female  Other Topics Concern   Not on file  Social History Narrative   Lives with wife in a 5 story retirement home.  He lives on the first floor.  4 year college degree.  Retired Corporate investment banker.  Married 4 years to his first wife's sister.     Social Drivers of Corporate investment banker Strain: Low Risk  (04/13/2020)   Overall Financial Resource Strain (CARDIA)    Difficulty of Paying Living Expenses: Not hard at all  Food Insecurity: No Food Insecurity (01/13/2023)   Hunger Vital Sign    Worried About Running Out of Food in the Last Year: Never true    Ran Out of Food in the Last Year: Never true  Transportation Needs: No Transportation Needs (01/13/2023)   PRAPARE - Administrator, Civil Service (Medical): No    Lack of  Transportation (Non-Medical): No  Physical Activity: Sufficiently Active (04/10/2019)   Exercise Vital Sign    Days of Exercise per Week: 5 days    Minutes of Exercise per Session: 30 min  Stress: No Stress Concern Present (04/13/2020)   [employer] of Occupational Health - Occupational Stress Questionnaire    Feeling of Stress : Not at all  Social Connections: Unknown (04/13/2020)   Social Connection and Isolation Panel [NHANES]    Frequency of Communication with Friends and Family: Not on file    Frequency of Social Gatherings with Friends and Family: Not on file    Attends Religious Services: Not on file    Active Member of Clubs or Organizations: Not on file    Attends Banker Meetings: Not on file    Marital Status: Married  Catering manager  Violence: Not At Risk (01/10/2023)   Humiliation, Afraid, Rape, and Kick questionnaire    Fear of Current or Ex-Partner: No    Emotionally Abused: No    Physically Abused: No    Sexually Abused: No    Family History  Problem Relation Age of Onset   Stroke Mother    Arthritis Father    Heart disease Father    Brain cancer Brother        1/2 brother (possibly related to mother NOT related to patient)   Cancer Cousin        unk type   Lymphoma Cousin        vs leukemia   Colon cancer Neg Hx    Esophageal cancer Neg Hx    Pancreatic cancer Neg Hx    Stomach cancer Neg Hx    Liver disease Neg Hx      Current Outpatient Medications:    acetaminophen (TYLENOL) 500 MG tablet, Take 1,000 mg by mouth every 6 (six) hours as needed for moderate pain. (Patient not taking: Reported on 05/12/2023), Disp: , Rfl:    Calcium Carb-Cholecalciferol (CALCIUM 600 + D PO), Take 1 tablet by mouth in the morning., Disp: , Rfl:    cholecalciferol (VITAMIN D3) 25 MCG (1000 UNIT) tablet, Take 1,000 Units by mouth in the morning., Disp: , Rfl:    Cranberry 500 MG CAPS, Take 500 mg by mouth in the morning., Disp: , Rfl:    denosumab  (PROLIA) 60 MG/ML SOSY injection, Inject 60 mg into the skin every 6 (six) months., Disp: 1 mL, Rfl: 1   glucose blood (ONETOUCH VERIO) test strip, Use to check blood sugars one time daily. Dx code: E11.65 (Patient not taking: Reported on 05/12/2023), Disp: 100 each, Rfl: 2   hydrocortisone (ANUSOL-HC) 2.5 % rectal cream, Place 1 Application rectally 2 (two) times daily. (Patient not taking: Reported on 08/23/2023), Disp: 30 g, Rfl: 0   ipratropium (ATROVENT) 0.06 % nasal spray, Place 2 sprays into both nostrils 4 (four) times daily., Disp: 15 mL, Rfl: 12   lipase/protease/amylase (CREON) 36000 UNITS CPEP capsule, Take 2 capsules with the first bite of each meal and 1 capsule with the first bite of each snack, Disp: 240 capsule, Rfl: 5   loratadine (CLARITIN) 10 MG tablet, Take 10 mg by mouth daily as needed for allergies., Disp: , Rfl:    metoprolol succinate (TOPROL-XL) 50 MG 24 hr tablet, Take 50 mg by mouth daily., Disp: , Rfl:    Misc Natural Products (JOINT HEALTH PO), Take 1 tablet by mouth every evening., Disp: , Rfl:    naproxen sodium (ALEVE) 220 MG tablet, Take 220 mg by mouth daily as needed., Disp: , Rfl:    omeprazole (PRILOSEC) 40 MG capsule, TAKE 1 CAPSULE (40 MG TOTAL) BY MOUTH DAILY., Disp: 90 capsule, Rfl: 1   Polyethyl Glycol-Propyl Glycol (SYSTANE OP), Place 1 drop into both eyes 2 (two) times daily as needed (dry/irritated eyes.)., Disp: , Rfl:    tamsulosin (FLOMAX) 0.4 MG CAPS capsule, TAKE 1 CAPSULE BY MOUTH EVERY DAY, Disp: 90 capsule, Rfl: 1   vitamin B-12 (CYANOCOBALAMIN) 1000 MCG tablet, Take 1,000 mcg by mouth every evening., Disp: , Rfl:  No current facility-administered medications for this visit.  Facility-Administered Medications Ordered in Other Visits:    heparin lock flush 100 UNIT/ML injection, , , ,    heparin lock flush 100 unit/mL, 500 Units, Intravenous, Once, [name], MD  DG Hand Complete Left Result  Date: 08/15/2023 CLINICAL DATA:  Pain and  swelling of the third and fourth PIP joints. EXAM: LEFT HAND - COMPLETE 3+ VIEW COMPARISON:  None Available. FINDINGS: Mildly decreased bone mineralization. Neutral ulnar variance. Mild-to-moderate thumb carpometacarpal joint space narrowing with subchondral sclerosis and mild peripheral osteophytosis. Mild to moderate thumb interphalangeal and mild second DIP joint space narrowing and peripheral spurring. No acute fracture or dislocation. 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 No significant osteoarthritis of the third and fourth PIP joints. Electronically Signed   By: Neita Garnet M.D.   On: 08/15/2023 13:34    No images are attached to the encounter.      Latest Ref Rng & Units 08/15/2023   10:59 AM  CMP  Glucose 70 - 99 mg/dL 161   BUN 6 - 23 mg/dL 20   Creatinine 0.96 - 1.50 mg/dL 0.45   Sodium 409 - 811 mEq/L 139   Potassium 3.5 - 5.1 mEq/L 4.3   Chloride 96 - 112 mEq/L 99   CO2 19 - 32 mEq/L 32   Calcium 8.4 - 10.5 mg/dL 9.2   Total Protein 6.0 - 8.3 g/dL 6.6   Total Bilirubin 0.2 - 1.2 mg/dL 0.5   Alkaline Phos 39 - 117 U/L 49   AST 0 - 37 U/L 18   ALT 0 - 53 U/L 14       Latest Ref Rng & Units 06/08/2023   11:37 AM  CBC  WBC 3.4 - 10.8 x10E3/uL 5.8   Hemoglobin 13.0 - 17.7 g/dL 91.4   Hematocrit 78.2 - 51.0 % 39.6   Platelets 150 - 450 x10E3/uL 263      Observation/objective: Appears in no acute distress over video visit today.  Breathing is nonlabored  Assessment and plan: Patient is a 89 year old male with metastatic pancreatic cancer with lung bone and adrenal metastases here to discuss further management  During my last visit in March 2025 I had discussed the results of PET scan and recommended that if we decide to start treatment it would be now instead of waiting for some point in the future.  The pros and cons of FOLFIRINOX/FOLFIRI chemotherapy were discussed with the patient and he chose to not go through  any chemotherapy given his age.  He also does not wish to transition to hospice at this time.  Overall he has a good quality of life and wishes to take 1 day at a time.  He does not desire any future scans at this time.He still has a port in place which she would like to keep it flushed every 3 months.  We will plan to get CBC with differential CMP and CA 19-9 in 2 weeks.  He is unsure as to which way he wants to go with his follow-up as far as I am concerned and he will keep [place] posted  Follow-up instructions: To be decided  I discussed the assessment and treatment plan with the patient. The patient was provided an opportunity to ask questions and all were answered. The patient agreed with the plan and demonstrated an understanding of the instructions.   The patient was advised to call back or seek an in-person evaluation if the symptoms worsen or if the condition fails to improve as anticipated.  I provided 18 minutes of face-to-face video visit time during this encounter, and > 50% was spent counseling as documented under my assessment & plan.  Visit Diagnosis: 1. Goals of care, counseling/discussion   2. Malignant neoplasm  of body of pancreas (HCC)     Dr. Owens Shark, MD, MPH [facility] Tel- [phone] 09/09/2023 2:27 PM

## 2023-09-12 ENCOUNTER — Other Ambulatory Visit: Payer: Self-pay | Admitting: Internal Medicine

## 2023-09-12 ENCOUNTER — Telehealth: Payer: Self-pay

## 2023-09-12 DIAGNOSIS — M810 Age-related osteoporosis without current pathological fracture: Secondary | ICD-10-CM

## 2023-09-12 MED ORDER — PROLIA 60 MG/ML ~~LOC~~ SOSY: 60.0000 mg | PREFILLED_SYRINGE | SUBCUTANEOUS | 1 refills | Status: DC

## 2023-09-12 NOTE — Telephone Encounter (Signed)
 Copied from CRM [id number]. Topic: Clinical - Medication Question >> Sep 12, 2023  8:31 AM George Owens wrote: Reason for CRM: Patient called in with several questions for nurse:   Patient states he has a terminal illness not taking treatments feel fine, but wants to know does he need to continue to take the Prolia injections?   If so does he need a bone density test before another injection?   If he needs injection but no bone density test he needs a prescription approval sent to the [facility] on [address], this isn't normal pharmacy [facility] stock [facility].

## 2023-09-12 NOTE — Telephone Encounter (Signed)
 Pt is aware and gave a verbal understanding. Pt was given the number to schedule the dexa scan.

## 2023-09-18 ENCOUNTER — Telehealth: Payer: Self-pay

## 2023-09-18 DIAGNOSIS — I480 Paroxysmal atrial fibrillation: Secondary | ICD-10-CM

## 2023-09-18 DIAGNOSIS — I10 Essential (primary) hypertension: Secondary | ICD-10-CM

## 2023-09-18 DIAGNOSIS — E559 Vitamin D deficiency, unspecified: Secondary | ICD-10-CM

## 2023-09-18 NOTE — Telephone Encounter (Signed)
 Spoke with George Owens and he wants to know if he needs to have his bone density test done before he gets his next prolia injection.

## 2023-09-18 NOTE — Telephone Encounter (Signed)
 LMTCB

## 2023-09-18 NOTE — Telephone Encounter (Signed)
 Copied from CRM [id number]. Topic: Clinical - Medication Question >> Sep 18, 2023  9:42 AM Sonny Dandy B wrote: Reason for CRM: pt called. To speak to a nurse regarding prolia, pt is requesting a call back has concerns regarding the injection.pt states he is has a bone density test schedule, wants to know if it's a potential problem. Please give pt a call back at [phone]

## 2023-09-18 NOTE — Telephone Encounter (Signed)
 Copied from CRM [id number]. Topic: General - Other >> Sep 18, 2023 10:25 AM George Owens D wrote: Reason for CRM: Patient is calling back to speak with Shanda Bumps regarding prolia. Patient stated that he had a missed call and would like to speak with her. Informed the patient that Shanda Bumps was with a patient at this time and should call back when available.

## 2023-09-19 ENCOUNTER — Encounter: Payer: Self-pay | Admitting: Oncology

## 2023-09-19 NOTE — Telephone Encounter (Signed)
 Pt is aware and gave a verbal understanding.

## 2023-09-19 NOTE — Addendum Note (Signed)
 Addended by: [name] on: 09/19/2023 08:24 AM   Modules accepted: Orders

## 2023-09-20 ENCOUNTER — Inpatient Hospital Stay: Attending: Oncology

## 2023-09-20 ENCOUNTER — Other Ambulatory Visit: Payer: Self-pay

## 2023-09-20 DIAGNOSIS — Z452 Encounter for adjustment and management of vascular access device: Secondary | ICD-10-CM | POA: Diagnosis not present

## 2023-09-20 DIAGNOSIS — C251 Malignant neoplasm of body of pancreas: Secondary | ICD-10-CM | POA: Insufficient documentation

## 2023-09-20 DIAGNOSIS — N400 Enlarged prostate without lower urinary tract symptoms: Secondary | ICD-10-CM | POA: Diagnosis not present

## 2023-09-20 DIAGNOSIS — C7800 Secondary malignant neoplasm of unspecified lung: Secondary | ICD-10-CM | POA: Diagnosis not present

## 2023-09-20 LAB — CMP (CANCER CENTER ONLY)
ALT: 28 U/L (ref 0–44)
AST: 36 U/L (ref 15–41)
Albumin: 3.5 g/dL (ref 3.5–5.0)
Alkaline Phosphatase: 69 U/L (ref 38–126)
Anion gap: 10 (ref 5–15)
BUN: 21 mg/dL (ref 8–23)
CO2: 25 mmol/L (ref 22–32)
Calcium: 8.7 mg/dL — ABNORMAL LOW (ref 8.9–10.3)
Chloride: 98 mmol/L (ref 98–111)
Creatinine: 0.82 mg/dL (ref 0.61–1.24)
GFR, Estimated: >60 mL/min (ref >60)
Glucose, Bld: 203 mg/dL — ABNORMAL HIGH (ref 70–99)
Potassium: 3.8 mmol/L (ref 3.5–5.1)
Sodium: 133 mmol/L — ABNORMAL LOW (ref 135–145)
Total Bilirubin: 0.8 mg/dL (ref 0.0–1.2)
Total Protein: 6.7 g/dL (ref 6.5–8.1)

## 2023-09-20 LAB — CBC WITH DIFFERENTIAL (CANCER CENTER ONLY)
Abs Immature Granulocytes: 0.02 10*3/uL (ref 0.00–0.07)
Basophils Absolute: 0 10*3/uL (ref 0.0–0.1)
Basophils Relative: 1 %
Eosinophils Absolute: 0.1 10*3/uL (ref 0.0–0.5)
Eosinophils Relative: 1 %
HCT: 37.2 % — ABNORMAL LOW (ref 39.0–52.0)
Hemoglobin: 12.7 g/dL — ABNORMAL LOW (ref 13.0–17.0)
Immature Granulocytes: 0 %
Lymphocytes Relative: 29 %
Lymphs Abs: 2.2 10*3/uL (ref 0.7–4.0)
MCH: 31.6 pg (ref 26.0–34.0)
MCHC: 34.1 g/dL (ref 30.0–36.0)
MCV: 92.5 fL (ref 80.0–100.0)
Monocytes Absolute: 0.8 10*3/uL (ref 0.1–1.0)
Monocytes Relative: 11 %
Neutro Abs: 4.3 10*3/uL (ref 1.7–7.7)
Neutrophils Relative %: 58 %
Platelet Count: 280 10*3/uL (ref 150–400)
RBC: 4.02 MIL/uL — ABNORMAL LOW (ref 4.22–5.81)
RDW: 13.4 % (ref 11.5–15.5)
WBC Count: 7.4 10*3/uL (ref 4.0–10.5)
nRBC: 0 % (ref 0.0–0.2)

## 2023-09-20 MED ORDER — HEPARIN SOD (PORK) LOCK FLUSH 100 UNIT/ML IV SOLN
500.0000 [IU] | Freq: Once | INTRAVENOUS | Status: AC
  Administered 2023-09-20: 500 [IU] via INTRAVENOUS
  Filled 2023-09-20: qty 5

## 2023-09-20 MED ORDER — SODIUM CHLORIDE 0.9% FLUSH
10.0000 mL | Freq: Once | INTRAVENOUS | Status: AC
  Administered 2023-09-20: 10 mL via INTRAVENOUS
  Filled 2023-09-20: qty 10

## 2023-09-21 LAB — PSA: Prostatic Specific Antigen: 0.49 ng/mL (ref 0.00–4.00)

## 2023-09-21 LAB — CANCER ANTIGEN 19-9: CA 19-9: 2435 U/mL — ABNORMAL HIGH (ref 0–35)

## 2023-09-25 ENCOUNTER — Telehealth: Payer: Self-pay | Admitting: *Deleted

## 2023-09-25 NOTE — Telephone Encounter (Signed)
 Per the pt. He was told by staff that we have symptom management appt . He has been having this for a while. He coughs a lot , pcp  sent a spray and helps some but it is is the worst when he talks the  cough is worse. He want to have an appt tues afternoon or wed Am if possible. He does not have a car today it is getting fixed

## 2023-09-25 NOTE — Telephone Encounter (Signed)
 Dr. Salvadore Valvano Robert pt with dx  1. Malignant neoplasm of body of pancreas   2. Carcinoma of pancreas metastatic to lung    Not on current tx.  Should he contact PCP?

## 2023-09-25 NOTE — Telephone Encounter (Signed)
 I called pt to get appt for 4/8 at 1 pm, and dr. Smith Robert is going to call the pt. Around 4-5 pm to speak to him about cancer. The wife will let him know.

## 2023-09-26 ENCOUNTER — Ambulatory Visit
Admission: RE | Admit: 2023-09-26 | Discharge: 2023-09-26 | Disposition: A | Discharge status: Home / Self Care | Source: Ambulatory Visit | Attending: Internal Medicine | Admitting: Internal Medicine

## 2023-09-26 ENCOUNTER — Inpatient Hospital Stay (HOSPITAL_BASED_OUTPATIENT_CLINIC_OR_DEPARTMENT_OTHER): Admitting: Hospice and Palliative Medicine

## 2023-09-26 VITALS — BP 119/74 | HR 89 | Temp 96.6°F | Wt 179.5 lb

## 2023-09-26 DIAGNOSIS — R059 Cough, unspecified: Secondary | ICD-10-CM

## 2023-09-26 DIAGNOSIS — C251 Malignant neoplasm of body of pancreas: Secondary | ICD-10-CM | POA: Diagnosis not present

## 2023-09-26 DIAGNOSIS — Z13820 Encounter for screening for osteoporosis: Secondary | ICD-10-CM | POA: Diagnosis not present

## 2023-09-26 DIAGNOSIS — M810 Age-related osteoporosis without current pathological fracture: Secondary | ICD-10-CM | POA: Diagnosis not present

## 2023-09-26 DIAGNOSIS — Z7189 Other specified counseling: Secondary | ICD-10-CM

## 2023-09-26 NOTE — Progress Notes (Signed)
 Patient reports new "urge" to cough when talking for past week with no other known symptoms.

## 2023-09-26 NOTE — Progress Notes (Signed)
 Symptom Management Clinic [facility] at [facility] [phone] [phone]  Patient Care Team: [name], MD as PCP - General (Internal Medicine) [name], RN as Oncology Nurse Navigator [name], MD as Consulting Physician (Oncology)   NAME OF PATIENT: George Owens  [id number]  [birth year]   DATE OF VISIT: 09/26/23  REASON FOR CONSULT: George Owens is a 89 y.o. male with multiple medical problems including pancreatic cancer.  Patient underwent 8 cycles of gemcitabine and Abraxane prior to undergoing laparoscopic surgery at [facility].  Patient was found to have no treatment response.  He subsequently completed 4 months of adjuvant gemcitabine and Abraxane chemotherapy in January 2023.  Patient has had progressive lung metastases.  Additional chemotherapy was offered but patient opted not to proceed.   INTERVAL HISTORY: Patient had a virtual visit with Dr. Smith Robert on 09/08/2023, at which time patient was doing well.  He was still quite active, walking daily.  Patient felt he had a good quality of life and opted not to proceed with either additional treatments or hospice.  Patient presents to [facility] today for evaluation of a dry cough, which has persisted over the past week.  Patient states that he coughs often when he is trying to talk.  Does endorse some allergies and chronic postnasal drip.  Also endorses heartburn and states that he recently dose reduce his omeprazole from twice daily dosing to once a day dosing.  Denies fever or chills.  No shortness of breath or chest pain.  Denies sinus or facial pain or tenderness.  Denies any neurologic complaints. Denies recent fevers or illnesses. Denies any easy bleeding or bruising. Reports fair appetite and weight loss. Denies chest pain. Denies any nausea, vomiting, constipation, or diarrhea. Denies urinary complaints. Patient offers no further specific complaints today.   PAST MEDICAL  HISTORY: Past Medical History:  Diagnosis Date   Adenocarcinoma of pancreas (HCC)    a.) stage 1B poorly differentiated adenocarcinoma of the pancreas (grade 3) s/p distal pancreatectomy and splenectomy (negative margins); 15 lymph nodes negative for malignancy   Allergy    Aortic atherosclerosis (HCC)    Basal cell carcinoma    right temple 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   Colon polyps    Coronary artery calcification seen on CT scan    COVID-19 virus infection 03/16/2020   Diastolic dysfunction 10/09/2020   a.) TTE 10/09/2020: EF 60-65%, no RWMAs, normal RVSF, Ao root 39 mm, G1DD   GERD (gastroesophageal reflux disease)    Hemorrhoids    Hepatic steatosis    Hyperlipidemia    Hypertension    Leg cramps    Lung nodules 10/07/2021   Neuropathy    Osteoporosis    a.) on denosumab injections   Paroxysmal A-fib (HCC) 02/25/2021   a.) developed postop following distal pancreatectomy and splenectomy; b.) CHA2DS2-VASc = 4 (age x2, HTN, vascular disease history); b.) cardiac rate/rhythm maintained on oral metoprolol succinate; no chronic OAC   Pneumonia 2022   Prostate cancer (HCC) 2006   Renal artery aneurysm (HCC)    Secondary diabetes (HCC)    does not check blood sugar   Thoracic ascending aortic aneurysm (HCC) 04/29/2020   a.) CT chest 04/29/2020: 4.7 cm; b.) CT CAP 08/04/2021: 4.6 cm; c.) CT chest 10/08/2021: 4.5 cm; d.) CT chest 02/25/2022: 4.6 cm; e.) CT chest  10/07/2022: 4.5 cm   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    PAST SURGICAL HISTORY:  Past Surgical History:  Procedure Laterality Date   BRONCHIAL BRUSHINGS  11/01/2022   Procedure: BRONCHIAL BRUSHINGS;  Surgeon: Raechel Chute, MD;  Location: [facility] ENDOSCOPY;  Service: Pulmonary;;   BRONCHIAL NEEDLE ASPIRATION BIOPSY  11/01/2022   Procedure: BRONCHIAL NEEDLE ASPIRATION BIOPSIES;  Surgeon: Raechel Chute, MD;  Location: [facility]  ENDOSCOPY;  Service: Pulmonary;;   BRONCHIAL NEEDLE ASPIRATION BIOPSY  04/18/2023   Procedure: BRONCHIAL NEEDLE ASPIRATION BIOPSIES;  Surgeon: Raechel Chute, MD;  Location: [facility] ENDOSCOPY;  Service: Pulmonary;;   BRONCHIAL WASHINGS  11/01/2022   Procedure: BRONCHIAL WASHINGS;  Surgeon: Raechel Chute, MD;  Location: [facility] ENDOSCOPY;  Service: Pulmonary;;   CATARACT EXTRACTION Right    CATARACT EXTRACTION W/PHACO Left 12/26/2017   Procedure: CATARACT EXTRACTION PHACO AND INTRAOCULAR LENS PLACEMENT (IOC)  COMPLICATED LEFT;  Surgeon: Lockie Mola, MD;  Location: [facility];  Service: Ophthalmology;  Laterality: Left;  MALYUGIN VISION BLUE   CHOLECYSTECTOMY  03/01/2023   at [facility]   colonoscopy     ESOPHAGOGASTRODUODENOSCOPY (EGD) WITH PROPOFOL     EYE MUSCLE SURGERY Left    IR KYPHO THORACIC WITH BONE BIOPSY  11/11/2021   LAPAROSCOPIC SPLENECTOMY  02/25/2021   PORTA CATH INSERTION N/A 10/19/2020   Procedure: PORTA CATH INSERTION;  Surgeon: Annice Needy, MD;  Location: [facility] INVASIVE CV LAB;  Service: Cardiovascular;  Laterality: N/A;   TONSILLECTOMY AND ADENOIDECTOMY     XI ROBOTIC ASSISTED LAPAROSCOPIC DISTAL PANCREATECTOMY  02/25/2021    HEMATOLOGY/ONCOLOGY HISTORY:  Oncology History  Malignant neoplasm of body of pancreas (HCC)  09/18/2020 Initial Diagnosis   Malignant neoplasm of body of pancreas (HCC)   10/02/2020 Cancer Staging   Staging form: Exocrine Pancreas, AJCC 8th Edition - Clinical stage from 10/02/2020: Stage IB (cT2, cN0, cM0) - Signed by [name], MD on 10/04/2020 Total positive nodes: 0   10/08/2020 - 07/20/2021 Chemotherapy   Patient is on Treatment Plan : PANCREATIC Abraxane / Gemcitabine D1,8,15 q28d      Genetic Testing   Negative genetic testing. No pathogenic variants identified on the [employer] CancerNext+RNA panel. The report date is 05/02/2022.  The CancerNext+RNAinsight gene panel offered by [employer] includes sequencing and rearrangement  analysis for the following 36 genes: APC*, ATM*, AXIN2, BARD1, BMPR1A, BRCA1*, BRCA2*, BRIP1*, CDH1*, CDK4, CDKN2A, CHEK2*, DICER1, MLH1*, MSH2*, MSH3, MSH6*, MUTYH*, NBN, NF1*, NTHL1, PALB2*, PMS2*, PTEN*, RAD51C*, RAD51D*, RECQL, SMAD4, SMARCA4, STK11 and TP53* (sequencing and deletion/duplication); HOXB13, POLD1 and POLE (sequencing only); EPCAM and GREM1 (deletion/duplication only).     ALLERGIES:  is allergic to kenalog [triamcinolone acetonide] and tramadol hcl.  MEDICATIONS:  Current Outpatient Medications  Medication Sig Dispense Refill   acetaminophen (TYLENOL) 500 MG tablet Take 1,000 mg by mouth every 6 (six) hours as needed for moderate pain. (Patient not taking: Reported on 05/12/2023)     Calcium Carb-Cholecalciferol (CALCIUM 600 + D PO) Take 1 tablet by mouth in the morning.     cholecalciferol (VITAMIN D3) 25 MCG (1000 UNIT) tablet Take 1,000 Units by mouth in the morning.     Cranberry 500 MG CAPS Take 500 mg by mouth in the morning.     denosumab (PROLIA) 60 MG/ML SOSY injection Inject 60 mg into the skin every 6 (six) months. 1 mL 1   glucose blood (ONETOUCH VERIO) test strip Use to check blood sugars one time daily. Dx code: E11.65 (Patient not taking: Reported on 05/12/2023) 100 each 2  hydrocortisone (ANUSOL-HC) 2.5 % rectal cream Place 1 Application rectally 2 (two) times daily. (Patient not taking: Reported on 08/23/2023) 30 g 0   ipratropium (ATROVENT) 0.06 % nasal spray Place 2 sprays into both nostrils 4 (four) times daily. 15 mL 12   lipase/protease/amylase (CREON) 36000 UNITS CPEP capsule Take 2 capsules with the first bite of each meal and 1 capsule with the first bite of each snack 240 capsule 5   loratadine (CLARITIN) 10 MG tablet Take 10 mg by mouth daily as needed for allergies.     metoprolol succinate (TOPROL-XL) 50 MG 24 hr tablet Take 50 mg by mouth daily.     Misc Natural Products (JOINT HEALTH PO) Take 1 tablet by mouth every evening.     naproxen sodium  (ALEVE) 220 MG tablet Take 220 mg by mouth daily as needed.     omeprazole (PRILOSEC) 40 MG capsule TAKE 1 CAPSULE (40 MG TOTAL) BY MOUTH DAILY. 90 capsule 1   Polyethyl Glycol-Propyl Glycol (SYSTANE OP) Place 1 drop into both eyes 2 (two) times daily as needed (dry/irritated eyes.).     tamsulosin (FLOMAX) 0.4 MG CAPS capsule TAKE 1 CAPSULE BY MOUTH EVERY DAY 90 capsule 1   vitamin B-12 (CYANOCOBALAMIN) 1000 MCG tablet Take 1,000 mcg by mouth every evening.     No current facility-administered medications for this visit.   Facility-Administered Medications Ordered in Other Visits  Medication Dose Route Frequency Provider Last Rate Last Admin   heparin lock flush 100 UNIT/ML injection            heparin lock flush 100 unit/mL  500 Units Intravenous Once [name], MD        VITAL SIGNS: There were no vitals taken for this visit. There were no vitals filed for this visit.  Estimated body mass index is 21.37 kg/m as calculated from the following:   Height as of 08/23/23: 6\' 3"  (1.905 m).   Weight as of 09/08/23: 171 lb (77.6 kg).  LABS: CBC:    Component Value Date/Time   WBC 7.4 09/20/2023 1058   WBC 6.6 04/24/2023 0959   HGB 12.7 (L) 09/20/2023 1058   HGB 13.4 06/08/2023 1137   HCT 37.2 (L) 09/20/2023 1058   HCT 39.6 06/08/2023 1137   PLT 280 09/20/2023 1058   PLT 263 06/08/2023 1137   MCV 92.5 09/20/2023 1058   MCV 94 06/08/2023 1137   MCV 91 05/29/2014 0917   NEUTROABS 4.3 09/20/2023 1058   NEUTROABS 2.8 06/08/2023 1137   NEUTROABS 1.9 05/29/2014 0917   LYMPHSABS 2.2 09/20/2023 1058   LYMPHSABS 2.2 06/08/2023 1137   LYMPHSABS 1.2 05/29/2014 0917   MONOABS 0.8 09/20/2023 1058   MONOABS 0.3 05/29/2014 0917   EOSABS 0.1 09/20/2023 1058   EOSABS 0.1 06/08/2023 1137   EOSABS 0.1 05/29/2014 0917   BASOSABS 0.0 09/20/2023 1058   BASOSABS 0.0 06/08/2023 1137   BASOSABS 0.0 05/29/2014 0917   Comprehensive Metabolic Panel:    Component Value Date/Time   NA 133 (L)  09/20/2023 1058   NA 145 05/29/2014 0917   K 3.8 09/20/2023 1058   K 3.6 05/29/2014 0917   CL 98 09/20/2023 1058   CL 105 05/29/2014 0917   CO2 25 09/20/2023 1058   CO2 33 (H) 05/29/2014 0917   BUN 21 09/20/2023 1058   BUN 20 (H) 05/29/2014 0917   CREATININE 0.82 09/20/2023 1058   CREATININE 0.83 04/19/2019 1544   GLUCOSE 203 (H) 09/20/2023 1058  GLUCOSE 116 (H) 05/29/2014 0917   CALCIUM 8.7 (L) 09/20/2023 1058   CALCIUM 8.3 (L) 05/29/2014 0917   AST 36 09/20/2023 1058   ALT 28 09/20/2023 1058   ALT 47 05/29/2014 0917   ALKPHOS 69 09/20/2023 1058   ALKPHOS 57 05/29/2014 0917   BILITOT 0.8 09/20/2023 1058   PROT 6.7 09/20/2023 1058   PROT 6.5 05/29/2014 0917   ALBUMIN 3.5 09/20/2023 1058   ALBUMIN 3.5 05/29/2014 0917    RADIOGRAPHIC STUDIES: No results found.  PERFORMANCE STATUS (ECOG) : 1 - Symptomatic but completely ambulatory  Review of Systems Unless otherwise noted, a complete review of systems is negative.  Physical Exam General: NAD Cardiovascular: regular rate and rhythm Pulmonary: clear ant fields Abdomen: soft, nontender, + bowel sounds GU: no suprapubic tenderness Extremities: no edema, no joint deformities Skin: no rashes Neurological: Generalized weakness but otherwise nonfocal  IMPRESSION/PLAN: Pancreatic cancer -CA 19-9 significantly elevated 1 week ago to 2,435 from 31, 5 months prior.  PET scan on 08/04/2023 showed progressive metastatic disease.  Discussed goals with patient today.  He states that he is still not interested in pursuing additional scans, workup, or cancer treatments.  We discussed option of hospice but patient states that he is active, walking daily, and does not feel that he would get much benefit from hospice at this time.  Also offered community palliative care.  He said he would think about it.  Cough -low suspicion for URI.  Lungs clear.  This could be secondary to chronic postnasal drip, versus seasonal allergies, versus GERD.  I  recommended conservative measures including increasing back to twice daily dosing of omeprazole.  Patient can trial fluticasone nasal spray.  Continue daily loratadine.  Patient has previous prescription of benzonatate that he can try.  Symptoms could also have cancer etiology given known lung metastasis.  However, this is less likely given acute onset.  Again, patient declined scans or other cancer workup.  ACP -had a long conversation with patient and wife regarding his wishes for end-of-life care.  Patient says he does not think that he would desire resuscitation or other heroic measures at end-of-life but wants to think about it prior to making a final decision. His wife said she thought she would likely opt for DNR.   Follow-up telephone visit 2 to 3 weeks.  Case and plan discussed with Dr. Smith Robert  Patient expressed understanding and was in agreement with this plan. He also understands that He can call clinic at any time with any questions, concerns, or complaints.   Thank you for allowing me to participate in the care of this very pleasant patient.   Time Total: 30 minutes  Visit consisted of counseling and education dealing with the complex and emotionally intense issues of symptom management in the setting of serious illness.Greater than 50%  of this time was spent counseling and coordinating care related to the above assessment and plan.  Signed by: Laurette Schimke, PhD, NP-C

## 2023-09-28 ENCOUNTER — Encounter: Payer: Self-pay | Admitting: Internal Medicine

## 2023-10-04 ENCOUNTER — Other Ambulatory Visit: Payer: Self-pay | Admitting: *Deleted

## 2023-10-04 NOTE — Telephone Encounter (Signed)
 Patient is going out on a trip and he would like to get a call back about what to do with my medication while on trip. He is worried that his pain in the hip and thigh. He states that it is the same place that the scan said hyper on the scan. He says that he is not sure if he can keep walking for exercise or does it make the hip worse. He will be gone Saturday through [place] and he would like to have pain to cover those days. He can't take tramadol at all

## 2023-10-04 NOTE — Telephone Encounter (Signed)
 Is he asking for pain medications? Does he wish to try oxycodone? If so we can give him oxycodone 5 mg q8 prn 30 tab

## 2023-10-05 MED ORDER — OXYCODONE HCL 5 MG PO TABS: 5.0000 mg | ORAL_TABLET | Freq: Three times a day (TID) | ORAL | 0 refills | Status: DC | PRN

## 2023-10-05 NOTE — Telephone Encounter (Signed)
 Outbound call to patient to further discuss.  Patient clarified he is requesting pain medication; states pain was pretty bad a day or two last week.  Patient requested script to be sent to [facility] on [address].  Advised patient to contact clinic for any new and/or worsening symptoms.

## 2023-10-10 ENCOUNTER — Inpatient Hospital Stay (HOSPITAL_BASED_OUTPATIENT_CLINIC_OR_DEPARTMENT_OTHER): Admitting: Hospice and Palliative Medicine

## 2023-10-10 ENCOUNTER — Telehealth: Payer: Self-pay | Admitting: *Deleted

## 2023-10-10 DIAGNOSIS — C251 Malignant neoplasm of body of pancreas: Secondary | ICD-10-CM

## 2023-10-10 DIAGNOSIS — Z515 Encounter for palliative care: Secondary | ICD-10-CM

## 2023-10-10 MED ORDER — AMOXICILLIN-POT CLAVULANATE 875-125 MG PO TABS: 1.0000 | ORAL_TABLET | Freq: Two times a day (BID) | ORAL | 0 refills | Status: DC

## 2023-10-10 NOTE — Telephone Encounter (Signed)
 The patient is having symptoms with his cancer  and wanted to see what things that can help along hospice with him. When talking he coughs and it makes it hard to talk.rao says it is a good time for George Owens help for the patient

## 2023-10-10 NOTE — Telephone Encounter (Signed)
 Starr from the hospice called today and the patient wants hospice and the patient wants to have Randy Buttery as his MD through hospice and they need that he has 6 months to live.

## 2023-10-10 NOTE — Telephone Encounter (Signed)
 Patient scheduled for telephone visit with Lilian Register, NP today.

## 2023-10-10 NOTE — Progress Notes (Signed)
 Virtual Visit via Telephone Note  I connected with [name] on 10/10/23 at  9:30 AM EDT by telephone and verified that I am speaking with the correct person using two identifiers.  Location: Patient: Home Provider: Clinic   I discussed the limitations, risks, security and privacy concerns of performing an evaluation and management service by telephone and the availability of in person appointments. I also discussed with the patient that there may be a patient responsible charge related to this service. The patient expressed understanding and agreed to proceed.   History of Present Illness: George Owens is a 89 y.o. male with multiple medical problems including pancreatic cancer.  Patient underwent 8 cycles of gemcitabine  and Abraxane  prior to undergoing laparoscopic surgery at [facility].  Patient was found to have no treatment response.  He subsequently completed 4 months of adjuvant gemcitabine  and Abraxane  chemotherapy in January 2023.  Patient has had progressive lung metastases.  Additional chemotherapy was offered but patient opted not to proceed.  Observations/Objective: I called and spoke with patient by phone.  Patient continues to endorse chronic cough, worse with talking.  He says that over the last several days he has had low-grade temperature of 100.0.  Denies significant shortness of breath or chest pain.  No chills.  He does have chronic postnasal drip, seasonal allergies, and GERD.  Patient also feels like he is overall declining and is now interested in pursuing hospice for his pancreatic cancer.  Patient confirms that he is not interested in further workup of his cancer or treatments.  He would like to stay home and focus on comfort and quality of life.  Assessment and Plan: Pancreatic cancer -referral to hospice  Cough -given low-grade fever, now more suspicious for infectious etiology.  Will start empirically on Augmentin  x 10 days.  Case and plan discussed with Dr.  Randy Buttery  Follow Up Instructions: As needed   I discussed the assessment and treatment plan with the patient. The patient was provided an opportunity to ask questions and all were answered. The patient agreed with the plan and demonstrated an understanding of the instructions.   The patient was advised to call back or seek an in-person evaluation if the symptoms worsen or if the condition fails to improve as anticipated.  I provided 15 minutes of non-face-to-face time during this encounter.   Peggyann Bower, NP

## 2023-10-11 NOTE — Telephone Encounter (Signed)
 I called George Owens at [facility] did not answer and sent the message that Dr. Randy Buttery says yes and his prognosis less than 6 months. She can call if needed

## 2023-10-11 NOTE — Telephone Encounter (Signed)
 Yes I am willing to certify his prognosis of <6 months and I will be his hospice attending

## 2023-10-17 ENCOUNTER — Telehealth: Admitting: Hospice and Palliative Medicine

## 2023-12-19 DEATH — deceased
